# Patient Record
Sex: Female | Born: 1950
Health system: Southern US, Community
[De-identification: ages and names within clinical notes are randomized; demographics above are authoritative.]

## PROBLEM LIST (undated history)

## (undated) DIAGNOSIS — T4145XA Adverse effect of unspecified anesthetic, initial encounter: Secondary | ICD-10-CM

## (undated) DIAGNOSIS — H9191 Unspecified hearing loss, right ear: Secondary | ICD-10-CM

## (undated) DIAGNOSIS — I890 Lymphedema, not elsewhere classified: Secondary | ICD-10-CM

## (undated) DIAGNOSIS — I219 Acute myocardial infarction, unspecified: Secondary | ICD-10-CM

## (undated) DIAGNOSIS — T8859XA Other complications of anesthesia, initial encounter: Secondary | ICD-10-CM

## (undated) DIAGNOSIS — I10 Essential (primary) hypertension: Secondary | ICD-10-CM

## (undated) DIAGNOSIS — Z720 Tobacco use: Secondary | ICD-10-CM

## (undated) DIAGNOSIS — IMO0001 Reserved for inherently not codable concepts without codable children: Secondary | ICD-10-CM

## (undated) DIAGNOSIS — H8109 Meniere's disease, unspecified ear: Secondary | ICD-10-CM

## (undated) DIAGNOSIS — F419 Anxiety disorder, unspecified: Secondary | ICD-10-CM

## (undated) DIAGNOSIS — I480 Paroxysmal atrial fibrillation: Secondary | ICD-10-CM

## (undated) DIAGNOSIS — Z972 Presence of dental prosthetic device (complete) (partial): Secondary | ICD-10-CM

## (undated) DIAGNOSIS — J449 Chronic obstructive pulmonary disease, unspecified: Secondary | ICD-10-CM

## (undated) DIAGNOSIS — K08109 Complete loss of teeth, unspecified cause, unspecified class: Secondary | ICD-10-CM

## (undated) DIAGNOSIS — I639 Cerebral infarction, unspecified: Secondary | ICD-10-CM

## (undated) DIAGNOSIS — Z87442 Personal history of urinary calculi: Secondary | ICD-10-CM

## (undated) DIAGNOSIS — E785 Hyperlipidemia, unspecified: Secondary | ICD-10-CM

## (undated) DIAGNOSIS — I2510 Atherosclerotic heart disease of native coronary artery without angina pectoris: Secondary | ICD-10-CM

## (undated) DIAGNOSIS — I499 Cardiac arrhythmia, unspecified: Secondary | ICD-10-CM

## (undated) DIAGNOSIS — E669 Obesity, unspecified: Secondary | ICD-10-CM

## (undated) DIAGNOSIS — E1140 Type 2 diabetes mellitus with diabetic neuropathy, unspecified: Secondary | ICD-10-CM

## (undated) DIAGNOSIS — I6381 Other cerebral infarction due to occlusion or stenosis of small artery: Secondary | ICD-10-CM

## (undated) DIAGNOSIS — I6522 Occlusion and stenosis of left carotid artery: Secondary | ICD-10-CM

## (undated) DIAGNOSIS — H539 Unspecified visual disturbance: Secondary | ICD-10-CM

## (undated) DIAGNOSIS — R32 Unspecified urinary incontinence: Secondary | ICD-10-CM

## (undated) DIAGNOSIS — R7989 Other specified abnormal findings of blood chemistry: Secondary | ICD-10-CM

## (undated) DIAGNOSIS — M1990 Unspecified osteoarthritis, unspecified site: Secondary | ICD-10-CM

## (undated) DIAGNOSIS — C801 Malignant (primary) neoplasm, unspecified: Secondary | ICD-10-CM

## (undated) HISTORY — DX: Meniere's disease, unspecified ear: H81.09

## (undated) HISTORY — DX: Atherosclerotic heart disease of native coronary artery without angina pectoris: I25.10

## (undated) HISTORY — DX: Unspecified visual disturbance: H53.9

## (undated) HISTORY — DX: Hyperlipidemia, unspecified: E78.5

## (undated) HISTORY — PX: COLONOSCOPY W/ POLYPECTOMY: SHX1380

## (undated) HISTORY — DX: Lymphedema, not elsewhere classified: I89.0

## (undated) HISTORY — PX: CORONARY STENT PLACEMENT: SHX1402

## (undated) HISTORY — DX: Unspecified osteoarthritis, unspecified site: M19.90

## (undated) HISTORY — DX: Paroxysmal atrial fibrillation: I48.0

## (undated) HISTORY — DX: Essential (primary) hypertension: I10

## (undated) HISTORY — DX: Obesity, unspecified: E66.9

## (undated) HISTORY — PX: OTHER SURGICAL HISTORY: SHX169

## (undated) HISTORY — DX: Chronic obstructive pulmonary disease, unspecified: J44.9

## (undated) HISTORY — PX: BREAST BIOPSY: SHX20

---

## 1998-05-05 ENCOUNTER — Other Ambulatory Visit: Admission: RE | Admit: 1998-05-05 | Discharge: 1998-05-05 | Payer: Self-pay | Admitting: Family Medicine

## 1998-11-30 ENCOUNTER — Emergency Department (HOSPITAL_COMMUNITY): Admission: EM | Admit: 1998-11-30 | Discharge: 1998-11-30 | Payer: Self-pay | Admitting: Emergency Medicine

## 2000-11-25 ENCOUNTER — Encounter: Payer: Self-pay | Admitting: Family Medicine

## 2000-11-25 ENCOUNTER — Other Ambulatory Visit: Admission: RE | Admit: 2000-11-25 | Discharge: 2000-11-25 | Payer: Self-pay | Admitting: Family Medicine

## 2000-11-25 ENCOUNTER — Encounter: Admission: RE | Admit: 2000-11-25 | Discharge: 2000-11-25 | Payer: Self-pay | Admitting: Family Medicine

## 2000-11-25 ENCOUNTER — Encounter (INDEPENDENT_AMBULATORY_CARE_PROVIDER_SITE_OTHER): Payer: Self-pay | Admitting: *Deleted

## 2001-03-24 ENCOUNTER — Ambulatory Visit (HOSPITAL_COMMUNITY): Admission: RE | Admit: 2001-03-24 | Discharge: 2001-03-24 | Payer: Self-pay | Admitting: Gastroenterology

## 2001-03-24 ENCOUNTER — Encounter (INDEPENDENT_AMBULATORY_CARE_PROVIDER_SITE_OTHER): Payer: Self-pay | Admitting: Specialist

## 2002-01-20 ENCOUNTER — Emergency Department (HOSPITAL_COMMUNITY): Admission: EM | Admit: 2002-01-20 | Discharge: 2002-01-20 | Payer: Self-pay | Admitting: Emergency Medicine

## 2002-02-18 ENCOUNTER — Encounter: Payer: Self-pay | Admitting: Family Medicine

## 2002-02-18 ENCOUNTER — Encounter: Admission: RE | Admit: 2002-02-18 | Discharge: 2002-02-18 | Payer: Self-pay | Admitting: Family Medicine

## 2004-05-09 ENCOUNTER — Other Ambulatory Visit: Admission: RE | Admit: 2004-05-09 | Discharge: 2004-05-09 | Payer: Self-pay | Admitting: Family Medicine

## 2004-05-26 ENCOUNTER — Encounter: Admission: RE | Admit: 2004-05-26 | Discharge: 2004-05-26 | Payer: Self-pay | Admitting: Family Medicine

## 2004-06-23 ENCOUNTER — Encounter (INDEPENDENT_AMBULATORY_CARE_PROVIDER_SITE_OTHER): Payer: Self-pay | Admitting: Specialist

## 2004-06-23 ENCOUNTER — Ambulatory Visit (HOSPITAL_COMMUNITY): Admission: RE | Admit: 2004-06-23 | Discharge: 2004-06-23 | Payer: Self-pay | Admitting: Gastroenterology

## 2005-05-02 ENCOUNTER — Encounter: Admission: RE | Admit: 2005-05-02 | Discharge: 2005-05-02 | Payer: Self-pay | Admitting: Otolaryngology

## 2005-05-14 ENCOUNTER — Other Ambulatory Visit: Admission: RE | Admit: 2005-05-14 | Discharge: 2005-05-14 | Payer: Self-pay | Admitting: Family Medicine

## 2005-06-01 ENCOUNTER — Encounter: Admission: RE | Admit: 2005-06-01 | Discharge: 2005-06-01 | Payer: Self-pay | Admitting: Family Medicine

## 2005-06-07 ENCOUNTER — Encounter: Admission: RE | Admit: 2005-06-07 | Discharge: 2005-09-05 | Payer: Self-pay | Admitting: *Deleted

## 2006-06-04 ENCOUNTER — Encounter: Admission: RE | Admit: 2006-06-04 | Discharge: 2006-06-04 | Payer: Self-pay | Admitting: Family Medicine

## 2006-10-08 ENCOUNTER — Other Ambulatory Visit: Admission: RE | Admit: 2006-10-08 | Discharge: 2006-10-08 | Payer: Self-pay | Admitting: Family Medicine

## 2006-11-29 ENCOUNTER — Encounter: Admission: RE | Admit: 2006-11-29 | Discharge: 2006-11-29 | Payer: Self-pay | Admitting: Family Medicine

## 2006-12-02 ENCOUNTER — Encounter (HOSPITAL_BASED_OUTPATIENT_CLINIC_OR_DEPARTMENT_OTHER): Admission: RE | Admit: 2006-12-02 | Discharge: 2006-12-05 | Payer: Self-pay | Admitting: Surgery

## 2007-06-25 ENCOUNTER — Encounter: Admission: RE | Admit: 2007-06-25 | Discharge: 2007-06-25 | Payer: Self-pay | Admitting: Family Medicine

## 2007-10-13 ENCOUNTER — Other Ambulatory Visit: Admission: RE | Admit: 2007-10-13 | Discharge: 2007-10-13 | Payer: Self-pay | Admitting: Family Medicine

## 2008-04-09 ENCOUNTER — Encounter: Admission: RE | Admit: 2008-04-09 | Discharge: 2008-04-09 | Payer: Self-pay | Admitting: Family Medicine

## 2008-04-12 ENCOUNTER — Encounter: Admission: RE | Admit: 2008-04-12 | Discharge: 2008-04-12 | Payer: Self-pay | Admitting: Family Medicine

## 2008-04-14 ENCOUNTER — Encounter: Admission: RE | Admit: 2008-04-14 | Discharge: 2008-04-14 | Payer: Self-pay | Admitting: Family Medicine

## 2008-06-04 ENCOUNTER — Encounter (INDEPENDENT_AMBULATORY_CARE_PROVIDER_SITE_OTHER): Payer: Self-pay | Admitting: Internal Medicine

## 2008-06-04 ENCOUNTER — Ambulatory Visit (HOSPITAL_COMMUNITY): Admission: RE | Admit: 2008-06-04 | Discharge: 2008-06-04 | Payer: Self-pay | Admitting: Internal Medicine

## 2008-06-04 ENCOUNTER — Encounter (INDEPENDENT_AMBULATORY_CARE_PROVIDER_SITE_OTHER): Payer: Self-pay | Admitting: Interventional Radiology

## 2008-10-15 ENCOUNTER — Ambulatory Visit (HOSPITAL_COMMUNITY): Admission: RE | Admit: 2008-10-15 | Discharge: 2008-10-15 | Payer: Self-pay | Admitting: Internal Medicine

## 2009-03-19 ENCOUNTER — Inpatient Hospital Stay (HOSPITAL_COMMUNITY): Admission: EM | Admit: 2009-03-19 | Discharge: 2009-03-22 | Payer: Self-pay | Admitting: Emergency Medicine

## 2009-03-19 ENCOUNTER — Ambulatory Visit: Payer: Self-pay | Admitting: Internal Medicine

## 2009-03-21 ENCOUNTER — Encounter (INDEPENDENT_AMBULATORY_CARE_PROVIDER_SITE_OTHER): Payer: Self-pay | Admitting: Internal Medicine

## 2009-03-22 ENCOUNTER — Ambulatory Visit: Payer: Self-pay | Admitting: Vascular Surgery

## 2009-04-13 ENCOUNTER — Ambulatory Visit: Payer: Self-pay | Admitting: Internal Medicine

## 2009-04-13 DIAGNOSIS — I480 Paroxysmal atrial fibrillation: Secondary | ICD-10-CM

## 2009-04-13 DIAGNOSIS — F17200 Nicotine dependence, unspecified, uncomplicated: Secondary | ICD-10-CM

## 2009-04-13 DIAGNOSIS — I4891 Unspecified atrial fibrillation: Secondary | ICD-10-CM | POA: Insufficient documentation

## 2009-04-13 DIAGNOSIS — E663 Overweight: Secondary | ICD-10-CM

## 2009-04-13 DIAGNOSIS — R0789 Other chest pain: Secondary | ICD-10-CM | POA: Insufficient documentation

## 2009-04-14 ENCOUNTER — Encounter: Admission: RE | Admit: 2009-04-14 | Discharge: 2009-04-14 | Payer: Self-pay | Admitting: Family Medicine

## 2009-04-19 ENCOUNTER — Telehealth (INDEPENDENT_AMBULATORY_CARE_PROVIDER_SITE_OTHER): Payer: Self-pay | Admitting: *Deleted

## 2009-05-06 ENCOUNTER — Ambulatory Visit: Payer: Self-pay | Admitting: Internal Medicine

## 2009-05-06 ENCOUNTER — Ambulatory Visit: Payer: Self-pay

## 2009-05-06 DIAGNOSIS — I10 Essential (primary) hypertension: Secondary | ICD-10-CM

## 2009-05-09 LAB — CONVERTED CEMR LAB
Basophils Relative: 0.9 % (ref 0.0–3.0)
Calcium: 9.2 mg/dL (ref 8.4–10.5)
Creatinine, Ser: 0.8 mg/dL (ref 0.4–1.2)
HCT: 44.2 % (ref 36.0–46.0)
Lymphs Abs: 2 10*3/uL (ref 0.7–4.0)
MCHC: 33.4 g/dL (ref 30.0–36.0)
MCV: 93.2 fL (ref 78.0–100.0)
Monocytes Relative: 7.4 % (ref 3.0–12.0)
Neutrophils Relative %: 60.3 % (ref 43.0–77.0)
RBC: 4.74 M/uL (ref 3.87–5.11)
Sodium: 140 meq/L (ref 135–145)
WBC: 7.1 10*3/uL (ref 4.5–10.5)

## 2009-06-13 ENCOUNTER — Telehealth: Payer: Self-pay | Admitting: Internal Medicine

## 2009-07-21 ENCOUNTER — Encounter (INDEPENDENT_AMBULATORY_CARE_PROVIDER_SITE_OTHER): Payer: Self-pay | Admitting: *Deleted

## 2009-08-29 ENCOUNTER — Ambulatory Visit: Payer: Self-pay | Admitting: Internal Medicine

## 2009-11-14 ENCOUNTER — Encounter: Payer: Self-pay | Admitting: Internal Medicine

## 2009-12-01 ENCOUNTER — Encounter: Payer: Self-pay | Admitting: Internal Medicine

## 2009-12-01 ENCOUNTER — Other Ambulatory Visit: Admission: RE | Admit: 2009-12-01 | Discharge: 2009-12-01 | Payer: Self-pay | Admitting: Family Medicine

## 2009-12-05 ENCOUNTER — Ambulatory Visit: Payer: Self-pay | Admitting: Internal Medicine

## 2010-01-24 ENCOUNTER — Encounter: Admission: RE | Admit: 2010-01-24 | Discharge: 2010-01-24 | Payer: Self-pay | Admitting: Internal Medicine

## 2010-03-12 ENCOUNTER — Encounter: Payer: Self-pay | Admitting: Family Medicine

## 2010-03-21 NOTE — Progress Notes (Signed)
Summary: Cancelling Myoview  Phone Note Outgoing Call   Call placed by: Milana Na, EMT-P,  April 19, 2009 4:47 PM Summary of Call: Reviewed information on Myoview Information Sheet (see scanned document for further details).  Spoke with patient. She states that she needs to cancel and will call back to reschedule. She is going to be out of town during the time of the test. smw

## 2010-03-21 NOTE — Assessment & Plan Note (Signed)
Summary: eph   Visit Type:  Initial Consult Primary Provider:  Catha Gosselin, MD  CC:  Swelling in both legs/ dizziness.[name]  History of Present Illness: The patient presents today for electrophysiology followup after her recent hospitalization for afib.  She reports multiple complaints today.  She reports shortness of breath at rest, not worsening with activity.  She reports occasional "palpitations and heart racing".  She also reports sharp lacenating chest pain at rest lasting several seconds.  In addition, she reports occasional nause and "hand tingling".  She is tearful and notes significant emotional stress.  She also reports significant anxiety recently and feels that her symptoms may be related to anxiety.  She denies symptoms of orthopnea, PND, lower extremity edema, dizziness, presyncope, syncope, or neurologic sequela. The patient is tolerating medications without difficulties and is otherwise without complaint today.   Current Medications (verified): 1)  Aspirin 81 Mg Tbec (Aspirin) .... Take One Tablet By Mouth Daily 2)  Clonidine Hcl 0.1 Mg Tabs (Clonidine Hcl) .... Take One Tablet By Mouth Twice A Day 3)  Crestor 20 Mg Tabs (Rosuvastatin Calcium) .... Take One Tablet By Mouth Daily. 4)  Pradaxa 150 Mg Caps (Dabigatran Etexilate Mesylate) .... Take 1 Tablet By Mouth Two Times A Day 5)  Multaq 400 Mg Tabs (Dronedarone Hcl) .... Take 1 Tablet By Mouth Two Times A Day 6)  Metformin Hcl 500 Mg Tabs (Metformin Hcl) .... Take One Tablet By Mouth Twice Daily. 7)  Valium 2 Mg Tabs (Diazepam) .... As Needed 8)  Spiriva Handihaler 18 Mcg Caps (Tiotropium Bromide Monohydrate) .... Once Daily 9)  Motrin Ib 200 Mg Tabs (Ibuprofen) .... As Needed  Allergies (verified): 1)  ! Sulfa  Past History:  Past Medical History:  1. Diabetes.   2. Hypertension.   3. Ongoing tobacco use.   4. COPD.   5. Obesity.   6. Chronic lymphedema.   7. Osteoarthritis.   8. Meniere disease.   9. Atrial  fibrillation  10.Status post nuclear stress testing in 2010 for which, she reports it was normal.   11.CVD  Family History:  The patient's mother died of bladder cancer at age 66.   Her father died of an MI at age 16 and brother died of leukemia at age  49.   Social History: The patient lives in [place] with her spouse.  She   has a 45-pack-year history of tobacco, which is ongoing.  She denies  alcohol or drug use.  Review of Systems       All systems are reviewed and negative except as listed in the HPI.   Vital Signs:  Patient profile:   60 year old female Height:      66 inches Weight:      212 pounds BMI:     34.34 Pulse rate:   50 / minute BP sitting:   142 / 86  (left arm)  Vitals Entered By: Laurance Flatten CMA (April 13, 2009 12:32 PM)  Physical Exam  General:  tearful and anxious Head:  normocephalic and atraumatic Eyes:  PERRLA/EOM intact; conjunctiva and lids normal. Mouth:  Teeth, gums and palate normal. Oral mucosa normal. Neck:  Neck supple, no JVD. No masses, thyromegaly or abnormal cervical nodes. Lungs:  Clear bilaterally to auscultation and percussion. Heart:  brady regular rhythm, no m/r/g Abdomen:  Bowel sounds positive; abdomen soft and non-tender without masses, organomegaly, or hernias noted. No hepatosplenomegaly. Msk:  Back normal, normal gait. Muscle strength and tone normal. Pulses:  pulses normal in all 4 extremities Extremities:  No clubbing or cyanosis.  1+ edema Neurologic:  Alert and oriented x 3.  CNII-XII intact, strength/sensation are intact Skin:  Intact without lesions or rashes. Cervical Nodes:  no significant adenopathy Psych:  Normal affect.   EKG  Procedure date:  04/13/2009  Findings:      sinus bradycardia 50 bpm, QTc 417, nonspecific ST/T changes  Impression & Recommendations:  Problem # 1:  ATRIAL FIBRILLATION (ICD-427.31) The patient was recently hospitalized with afib in the setting of a URI.  She has  palpitations which may be further afib.  We will place a 21 day event monitor to evaluation for arrhythmias and heart rate control. Continue multaq and pradaxa.  Consider CBC and CMET upon return with recent addition of pradaxa.  Problem # 2:  CHEST PAIN, ATYPICAL (ICD-786.59) The patient has atypical chest pain and SOB of unclear etiology.  We will place 21 day monitor to evaluate for arrhythmias as the cause. We will also obtain a lexiscan myoview to evaluate for ischemia.   Pt advised to go to the ER in the interim for worsening CP or SOB.  Her updated medication list for this problem includes:    Aspirin 81 Mg Tbec (Aspirin) .[name]... Take one tablet by mouth daily  Problem # 3:  TOBACCO ABUSE (ICD-305.1) cessation advised  Problem # 4:  OVERWEIGHT/OBESITY (ICD-278.02) lifestyle modificaiton advised  Other Orders: EKG w/ Interpretation (93000) Event (Event) Nuclear Stress Test (Nuc Stress Test)  Patient Instructions: 1)  Your physician has requested that you have an exercise stress myoview.  For further information please visit [email or web address]  Please follow instruction sheet, as given. 2)  Your physician has recommended that you wear an event monitor.  Event monitors are medical devices that record the heart's electrical activity. Doctors most often use these monitors to diagnose arrhythmias. Arrhythmias are problems with the speed or rhythm of the heartbeat. The monitor is a small, portable device. You can wear one while you do your normal daily activities. This is usually used to diagnose what is causing palpitations/syncope (passing out). 3)  Your physician discussed the hazards of tobacco use.  Tobacco use cessation is recommended and techniques and options to help you quit were discussed. 4)  Your physician recommends that you schedule a follow-up appointment in: 4 WEEKS

## 2010-03-21 NOTE — Assessment & Plan Note (Signed)
Summary: 1 month   Visit Type:  Follow-up Primary Provider:  Catha Gosselin, MD   History of Present Illness: The patient presents today for electrophysiology followup.   She continues to have frequent palpitations and episodic "heart racing", predominantly at night.  She did not have her stress test or event monitor placed as ordered.  She states that her insurance would not pay for her monitor. She reports shortness of breath at rest, not worsening with activity.  She reports occasional "palpitations and heart racing".  She has had no further chest pain.  She continues to smoke.  She denies symptoms of orthopnea, PND, lower extremity edema, dizziness, presyncope, syncope, or neurologic sequela. The patient is tolerating medications without difficulties and is otherwise without complaint today.   Current Medications (verified): 1)  Aspirin 81 Mg Tbec (Aspirin) .... Take One Tablet By Mouth Daily 2)  Clonidine Hcl 0.1 Mg Tabs (Clonidine Hcl) .... Take One Tablet By Mouth Twice A Day/ Pt Is Out. 3)  Crestor 20 Mg Tabs (Rosuvastatin Calcium) .... Take One Tablet By Mouth Daily. 4)  Pradaxa 150 Mg Caps (Dabigatran Etexilate Mesylate) .... Take 1 Tablet By Mouth Two Times A Day 5)  Multaq 400 Mg Tabs (Dronedarone Hcl) .... Take 1 Tablet By Mouth Two Times A Day 6)  Metformin Hcl 500 Mg Tabs (Metformin Hcl) .... Take One Tablet By Mouth Twice Daily. 7)  Valium 2 Mg Tabs (Diazepam) .... As Needed 8)  Spiriva Handihaler 18 Mcg Caps (Tiotropium Bromide Monohydrate) .... As Needed 9)  Motrin Ib 200 Mg Tabs (Ibuprofen) .... As Needed  Allergies (verified): 1)  ! Sulfa  Past History:  Past Medical History: Reviewed history from 04/13/2009 and no changes required.  1. Diabetes.   2. Hypertension.   3. Ongoing tobacco use.   4. COPD.   5. Obesity.   6. Chronic lymphedema.   7. Osteoarthritis.   8. Meniere disease.   9. Atrial fibrillation  10.Status post nuclear stress testing in 2010 for  which, she reports it was normal.   11.CVD  Social History: Reviewed history from 04/13/2009 and no changes required. The patient lives in [place] with her spouse.  She   has a 45-pack-year history of tobacco, which is ongoing.  She denies  alcohol or drug use.  Review of Systems       All systems are reviewed and negative except as listed in the HPI.   Vital Signs:  Patient profile:   60 year old female Height:      66 inches Weight:      210 pounds BMI:     34.02 Pulse rate:   55 / minute BP sitting:   150 / 90  (left arm)  Vitals Entered By: Laurance Flatten CMA (May 06, 2009 9:33 AM)  Physical Exam  General:  Well developed, well nourished, in no acute distress. Head:  normocephalic and atraumatic Eyes:  PERRLA/EOM intact; conjunctiva and lids normal. Mouth:  Teeth, gums and palate normal. Oral mucosa normal. Neck:  Neck supple, no JVD. No masses, thyromegaly or abnormal cervical nodes. Lungs:  Clear bilaterally to auscultation and percussion. Heart:  brady regular rhythm, no m/r/g Abdomen:  Bowel sounds positive; abdomen soft and non-tender without masses, organomegaly, or hernias noted. No hepatosplenomegaly. Msk:  Back normal, normal gait. Muscle strength and tone normal. Pulses:  pulses normal in all 4 extremities Extremities:  No clubbing or cyanosis.  1+ edema Neurologic:  Alert and oriented x 3.  CNII-XII intact,  strength/sensation are intact Skin:  Intact without lesions or rashes. Cervical Nodes:  no significant adenopathy Psych:  Normal affect.   EKG  Procedure date:  05/06/2009  Findings:      sinus rhythm 54 bpm, short PR with possible pre-excitation, QT 520 msec  Impression & Recommendations:  Problem # 1:  ATRIAL FIBRILLATION (ICD-427.31)  The patient was recently hospitalized with afib in the setting of a URI.  She has palpitations which may be further afib, though she is in sinus today.  Ideally a heart monitor would be beneficial.  We will  attempt to have a lifewatch placed if insurance will allow Continue multaq and pradaxa.  CBC and CMET upon return with recent addition of pradaxa. I have reviewed her EKG which reveals a short PR today.  We will consider EP study pending results of her event monitor.  Orders: EKG w/ Interpretation (93000) Holter Monitor (Holter Monitor)  Problem # 2:  CHEST PAIN, ATYPICAL (ICD-786.59) Pt declines stress testing at this time. Her chest pain has resolved If symptoms return, she will need further risk stratification.  Problem # 3:  TOBACCO ABUSE (ICD-305.1) cessation advised  Problem # 4:  ESSENTIAL HYPERTENSION, BENIGN (ICD-401.1) will add HCTZ 25mg  daily today  Other Orders: TLB-BMP (Basic Metabolic Panel-BMET) (80048-METABOL) TLB-CBC Platelet - w/Differential (85025-CBCD)  Patient Instructions: 1)  Your physician recommends that you schedule a follow-up appointment in: 6 weeks with Dr Johney Frame 2)  Your physician recommends that you return for lab work today 3)  Stop Smoking 4)  Your physician has recommended you make the following change in your medication: stop Clonidine and start HCTZ 25mg  daily 5)  Your physician has recommended that you wear an event monitor.  Event monitors are medical devices that record the heart's electrical activity. Doctors most often use these monitors to diagnose arrhythmias. Arrhythmias are problems with the speed or rhythm of the heartbeat. The monitor is a small, portable device. You can wear one while you do your normal daily activities. This is usually used to diagnose what is causing palpitations/syncope (passing out). Prescriptions: HYDROCHLOROTHIAZIDE 25 MG TABS (HYDROCHLOROTHIAZIDE) one by mouth daily  #30 x 11   Entered by:   Dennis Bast, RN, BSN   Authorized by:   Hillis Range, MD   Signed by:   Dennis Bast, RN, BSN on 05/06/2009   Method used:   Electronically to        [facility] [address] [phone]* (retail)       [address]       [place], [place]  [address]       Ph: [phone] or [phone]       Fax: [phone]   RxID:   [id number]

## 2010-03-21 NOTE — Assessment & Plan Note (Signed)
Summary: rov   Visit Type:  Follow-up Primary Provider:  Catha Gosselin, MD   History of Present Illness: The patient presents today for routine electrophysiology followup. She reports doing very well since last being seen in our  clinic.  She is unaware of any further episodes of afib.  The patient denies symptoms of palpitations, chest pain, orthopnea, PND, lower extremity edema, dizziness, presyncope, syncope, or neurologic sequela.  She reports occasional chest discomfort associated with spicy meals.  She continues to smoke and has stable SOB.  The patient is tolerating medications without difficulties and is otherwise without complaint today.   Current Medications (verified): 1)  Aspirin 81 Mg Tbec (Aspirin) .... Take One Tablet By Mouth Daily 2)  Crestor 20 Mg Tabs (Rosuvastatin Calcium) .... Take One Tablet By Mouth Daily. 3)  Multaq 400 Mg Tabs (Dronedarone Hcl) .... Take 1 Tablet By Mouth Two Times A Day 4)  Amaryl 4 Mg Tabs (Glimepiride) .... Once Daily 5)  Valium 2 Mg Tabs (Diazepam) .... As Needed 6)  Spiriva Handihaler 18 Mcg Caps (Tiotropium Bromide Monohydrate) .... As Needed 7)  Motrin Ib 200 Mg Tabs (Ibuprofen) .... As Needed 8)  Hydrochlorothiazide 25 Mg Tabs (Hydrochlorothiazide) .... One By Mouth Daily 9)  Metformin Hcl 500 Mg Tabs (Metformin Hcl) .... Once Daily  Allergies: 1)  ! Sulfa  Past History:  Past Medical History: Reviewed history from 04/13/2009 and no changes required.  1. Diabetes.   2. Hypertension.   3. Ongoing tobacco use.   4. COPD.   5. Obesity.   6. Chronic lymphedema.   7. Osteoarthritis.   8. Meniere disease.   9. Atrial fibrillation  10.Status post nuclear stress testing in 2010 for which, she reports it was normal.   11.CVD  Social History: Reviewed history from 04/13/2009 and no changes required. The patient lives in [place] with her spouse.  She   has a 45-pack-year history of tobacco, which is ongoing.  She denies  alcohol or  drug use.  Vital Signs:  Patient profile:   60 year old female Height:      66 inches Weight:      210 pounds BMI:     34.02 Pulse rate:   57 / minute BP sitting:   130 / 70  (left arm)  Vitals Entered By: Laurance Flatten CMA (August 29, 2009 12:00 PM)  Physical Exam  General:  Well developed, well nourished, in no acute distress. Head:  normocephalic and atraumatic Eyes:  PERRLA/EOM intact; conjunctiva and lids normal. Mouth:  Teeth, gums and palate normal. Oral mucosa normal. Neck:  Neck supple, no JVD. No masses, thyromegaly or abnormal cervical nodes. Lungs:  Clear bilaterally to auscultation and percussion. Heart:   regular rhythm, no m/r/g Abdomen:  Bowel sounds positive; abdomen soft and non-tender without masses, organomegaly, or hernias noted. No hepatosplenomegaly. Msk:  Back normal, normal gait. Muscle strength and tone normal. Pulses:  pulses normal in all 4 extremities Extremities:  No clubbing or cyanosis.  1+ edema in R leg Neurologic:  Alert and oriented x 3.  CNII-XII intact, strength/sensation are intact Skin:  Intact without lesions or rashes. Cervical Nodes:  no significant adenopathy Psych:  Normal affect.   EKG  Procedure date:  08/29/2009  Findings:      sinus bradycardia 57 bpm, PR 126, nonspecific ST/T changes  Event Monitor  Procedure date:  06/13/2009  Findings:      sinis rhythm,  no SVT or afib,  occasional PACs and  PVCs  Impression & Recommendations:  Problem # 1:  ATRIAL FIBRILLATION (ICD-427.31) maintaining sinus rhythm with multaq she stopped pradaxa due to costs.  I have made it clear that given her CHADS2 score of 2, she should either be on coumadin or pradaxa.  She says that she will restart pradaxa at this time. She should stop asa once on pradaxa  Problem # 2:  ESSENTIAL HYPERTENSION, BENIGN (ICD-401.1) stable no changes  Problem # 3:  TOBACCO ABUSE (ICD-305.1) cessation advised she is reluctant to quit at this time  Patient  Instructions: 1)  Your physician recommends that you schedule a follow-up appointment in: 6 months with Dr Johney Frame 2)  Your physician has recommended you make the following change in your medication: resstart Pradaxa 150mg  two times a day Prescriptions: PRADAXA 150 MG CAPS (DABIGATRAN ETEXILATE MESYLATE) one by mouth two times a day  #60 x 6   Entered by:   Dennis Bast, RN, BSN   Authorized by:   Hillis Range, MD   Signed by:   Dennis Bast, RN, BSN on 08/29/2009   Method used:   [facility]  [address] [phone][facility] (retail)       [address]       [place], [place]  [address]       Ph: [phone] or [phone]       Fax: [phone]   RxID:   [id number]

## 2010-03-21 NOTE — Miscellaneous (Signed)
Summary: Multaq Review  Clinical Lists Changes   EP Quality review of patient taking Multaq.  I have reviewed clinical chart which reveals that Melanie Lucas is doing well on Multaq and receiving clinical benefit.  Her EF is preserved and she has no symptoms of CHF. She is maintaining sinus rhythm with multaq.   No changes are therefore advised.    She will need lfts obtained.

## 2010-03-21 NOTE — Progress Notes (Signed)
Summary: calling back  Phone Note Call from Patient Call back at Home Phone [phone]   Caller: Patient Reason for Call: Talk to Nurse Summary of Call: calling back Initial call taken by: Lorne Skeens,  June 13, 2009 12:38 PM  Follow-up for Phone Call        gave her the results of monitor Dennis Bast, RN, BSN  June 13, 2009 1:19 PM

## 2010-03-21 NOTE — Letter (Signed)
Summary: Appointment - Missed  [facility], Main Office  [address]   [place], [place] [address]   Phone: [phone]  Fax: [phone]     July 21, 2009 MRN: [id number]   [facility] [address] [place], [place]  [address]   Dear Ms. Hard,  Our records indicate you missed your appointment on 07/20/09 with Dr Johney Frame. It is very important that we reach you to reschedule this appointment. We look forward to participating in your health care needs. Please contact us at the number listed above at your earliest convenience to reschedule this appointment.     Sincerely,   Ruel Favors Scheduling Team

## 2010-05-07 LAB — URINE CULTURE
Colony Count: NO GROWTH
Culture: NO GROWTH

## 2010-05-07 LAB — TSH: TSH: 1.537 u[IU]/mL (ref 0.350–4.500)

## 2010-05-07 LAB — CBC
Hemoglobin: 14.9 g/dL (ref 12.0–15.0)
MCHC: 34 g/dL (ref 30.0–36.0)
Platelets: 220 10*3/uL (ref 150–400)
Platelets: 227 10*3/uL (ref 150–400)
RDW: 12.7 % (ref 11.5–15.5)
RDW: 12.9 % (ref 11.5–15.5)
WBC: 9 10*3/uL (ref 4.0–10.5)
WBC: 9.1 10*3/uL (ref 4.0–10.5)

## 2010-05-07 LAB — CULTURE, BLOOD (ROUTINE X 2)
Culture: NO GROWTH
Culture: NO GROWTH

## 2010-05-07 LAB — GLUCOSE, CAPILLARY
Glucose-Capillary: 214 mg/dL — ABNORMAL HIGH (ref 70–99)
Glucose-Capillary: 217 mg/dL — ABNORMAL HIGH (ref 70–99)
Glucose-Capillary: 227 mg/dL — ABNORMAL HIGH (ref 70–99)
Glucose-Capillary: 252 mg/dL — ABNORMAL HIGH (ref 70–99)
Glucose-Capillary: 260 mg/dL — ABNORMAL HIGH (ref 70–99)
Glucose-Capillary: 269 mg/dL — ABNORMAL HIGH (ref 70–99)

## 2010-05-07 LAB — DIFFERENTIAL
Basophils Absolute: 0.1 10*3/uL (ref 0.0–0.1)
Lymphs Abs: 2.7 10*3/uL (ref 0.7–4.0)

## 2010-05-07 LAB — COMPREHENSIVE METABOLIC PANEL
ALT: 16 U/L (ref 0–35)
AST: 15 U/L (ref 0–37)
AST: 16 U/L (ref 0–37)
Alkaline Phosphatase: 108 U/L (ref 39–117)
BUN: 9 mg/dL (ref 6–23)
CO2: 27 mEq/L (ref 19–32)
Calcium: 8.5 mg/dL (ref 8.4–10.5)
Chloride: 102 mEq/L (ref 96–112)
Chloride: 104 mEq/L (ref 96–112)
GFR calc Af Amer: >60 mL/min (ref >60)
GFR calc non Af Amer: >60 mL/min (ref >60)
Glucose, Bld: 223 mg/dL — ABNORMAL HIGH (ref 70–99)
Potassium: 3.5 mEq/L (ref 3.5–5.1)
Potassium: 4 mEq/L (ref 3.5–5.1)
Sodium: 137 mEq/L (ref 135–145)
Sodium: 138 mEq/L (ref 135–145)

## 2010-05-07 LAB — PROTIME-INR
INR: 0.94 (ref 0.00–1.49)
INR: 0.98 (ref 0.00–1.49)
INR: 0.99 (ref 0.00–1.49)
Prothrombin Time: 12.5 seconds (ref 11.6–15.2)

## 2010-05-07 LAB — LEGIONELLA ANTIGEN, URINE: Legionella Antigen, Urine: NEGATIVE

## 2010-05-07 LAB — RAPID URINE DRUG SCREEN, HOSP PERFORMED
Amphetamines: NOT DETECTED
Barbiturates: NOT DETECTED
Benzodiazepines: NOT DETECTED
Cocaine: NOT DETECTED
Opiates: NOT DETECTED

## 2010-05-07 LAB — BRAIN NATRIURETIC PEPTIDE: Pro B Natriuretic peptide (BNP): 234 pg/mL — ABNORMAL HIGH (ref 0.0–100.0)

## 2010-05-07 LAB — D-DIMER, QUANTITATIVE: D-Dimer, Quant: <0.22 ug/mL-FEU (ref 0.00–0.48)

## 2010-05-07 LAB — HEPARIN LEVEL (UNFRACTIONATED): Heparin Unfractionated: 0.42 IU/mL (ref 0.30–0.70)

## 2010-05-07 LAB — URINALYSIS, ROUTINE W REFLEX MICROSCOPIC
Bilirubin Urine: NEGATIVE
Hgb urine dipstick: NEGATIVE
Protein, ur: NEGATIVE mg/dL
Urobilinogen, UA: 0.2 mg/dL (ref 0.0–1.0)

## 2010-05-07 LAB — CARDIAC PANEL(CRET KIN+CKTOT+MB+TROPI)
Relative Index: INVALID (ref 0.0–2.5)
Total CK: 35 U/L (ref 7–177)
Total CK: 39 U/L (ref 7–177)
Troponin I: 0.08 ng/mL — ABNORMAL HIGH (ref 0.00–0.06)

## 2010-05-07 LAB — POCT CARDIAC MARKERS
Myoglobin, poc: 56.1 ng/mL (ref 12–200)
Myoglobin, poc: 56.3 ng/mL (ref 12–200)
Troponin i, poc: <0.05 ng/mL (ref 0.00–0.09)

## 2010-05-10 LAB — LIPID PANEL
HDL: 43 mg/dL (ref >39)
LDL Cholesterol: 107 mg/dL — ABNORMAL HIGH (ref 0–99)
Triglycerides: 130 mg/dL (ref <150)

## 2010-05-10 LAB — CBC
Hemoglobin: 13.3 g/dL (ref 12.0–15.0)
MCHC: 34.6 g/dL (ref 30.0–36.0)
RBC: 4.15 MIL/uL (ref 3.87–5.11)
WBC: 7.5 10*3/uL (ref 4.0–10.5)

## 2010-05-10 LAB — PROTIME-INR
INR: 1.3 (ref 0.00–1.49)
Prothrombin Time: 16.1 seconds — ABNORMAL HIGH (ref 11.6–15.2)

## 2010-05-10 LAB — GLUCOSE, CAPILLARY: Glucose-Capillary: 158 mg/dL — ABNORMAL HIGH (ref 70–99)

## 2010-05-31 LAB — CBC
HCT: 42.6 % (ref 36.0–46.0)
Hemoglobin: 15 g/dL (ref 12.0–15.0)
RBC: 4.68 MIL/uL (ref 3.87–5.11)
WBC: 6.4 10*3/uL (ref 4.0–10.5)

## 2010-05-31 LAB — GLUCOSE, CAPILLARY
Glucose-Capillary: 157 mg/dL — ABNORMAL HIGH (ref 70–99)
Glucose-Capillary: 252 mg/dL — ABNORMAL HIGH (ref 70–99)

## 2010-05-31 LAB — PROTIME-INR: INR: 1 (ref 0.00–1.49)

## 2010-07-04 NOTE — Letter (Signed)
May 06, 2009     RE:  Melanie Lucas, Melanie Lucas  MRN:  [id number]  /  DOB:  1950-09-18   To Whom it May Concern:   I am writing a letter of medical necessity for a 21 day [employer] event  monitor for [name].  Melanie Lucas is a very pleasant 60 year old female  with recently diagnosed atrial fibrillation during a hospitalization for  rapid heart racing.  She was initiated on medical therapy for atrial  fibrillation.  She subsequently continues to have frequent episodes of  heart racing as well as palpitations predominantly at night.  She has  occasional intermittent episodes of shortness of breath as well.  Her  EKG has documented a short PR interval and is suggestive of possible pre-  excitation.  I think to further medically manage her palpitations it  would be of utmost importance to document their etiology.  I therefore  feel strongly that she should have an event monitor placed for  evaluation of her palpitations and also to evaluate episodic atrial  fibrillation to make sure that her heart rates are well-controlled.   Your assistance in this manner is greatly appreciated.    Sincerely,      Hillis Range, MD  Electronically Signed    JA/MedQ  DD: 05/06/2009  DT: 05/06/2009  Job #: [id number]   CC:    Dennis Bast

## 2010-07-04 NOTE — Consult Note (Signed)
NAMEMARYCARMEN, Melanie Lucas                  ACCOUNT NO.:  [id number]   MEDICAL RECORD NO.:  [id number]          PATIENT TYPE:  REC   LOCATION:  FOOT                         FACILITY:  [facility]   PHYSICIAN:  Theresia Majors. Tanda Rockers, M.D.DATE OF BIRTH:  August 01, 1950   DATE OF CONSULTATION:  12/03/2006  DATE OF DISCHARGE:                                 CONSULTATION   REFERRING PHYSICIAN:  Dr. Illene Bolus.   Melanie Lucas is a 60 year old female referred by Dr. Illene Bolus for  evaluation of lymphedema.   IMPRESSION:  Chronic lymphedema.   RECOMMENDATION:  We have recommended that the patient be referred to the  lymphedema clinic for consideration of the integration of bilateral  segmental compression leg pumps for control of her lymphedema.   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  She has not complained of ulcerations.  There has been no pain.  She does complain of heaviness in the legs and  some mild dull aching.  There has been no fever and no trauma.   PAST MEDICAL HISTORY:   ALLERGIES:  Sulfa.   PREVIOUS SURGERY:  Has included a benign breast biopsy on 2 occasions.   HER CURRENT MEDICATION LIST INCLUDES:  1. Terbinafine 250 mg daily.  2. Simvastatin 40 mg h.s.  3. Lisinopril 20 mg q.a.m.  4. Cefazolin 500 mg t.i.d.  5. Valium p.r.n.   FAMILY HISTORY:  Positive for hypertension, stroke and diabetes.   SOCIALLY:  She is retired.  She resides in [facility] [place].  She has 2  adult children.   REVIEW OF SYSTEMS:  The patient is a 1-pack per day smoker for 43 years.  She does have a nagging cough.  Her last chest x-ray was within the  year.  She denies hemoptysis or significant dyspnea on exertion.  She  denies transient visual loss, speech impediment other than the stigmata  of TIAs.  She  specifically denies chest pain or tachycardias.  There are  no GI or GU complaints.  She does admit to some transient anxiety.  The  remainder of the review of systems is negative.   PHYSICAL EXAM:  She is an alert, pleasant female in no acute distress.  Blood pressure is 143/72, respirations 18, pulse rate 62, temperature is  98.3.  HEENT:  Exam is clear.  NECK:  Supple.  Trachea is midline.  Thyroid is nonpalpable.  LUNGS:  Clear.  HEART:  Sounds are normal.  ABDOMEN:  Soft.  EXTREMITY EXAM:  Remarkable for bilateral lymphedema.  There is a well-  formed eschar on a miniscule area on the lateral lower extremity.  There  is no hyperpigmentation.  The pedal pulses are 3+ bilaterally.  There is  engorgement of all 5 toes on each foot, but there are no verrucae  formations, the pedal surface of the foot is smooth without extreme  callus.  NEUROLOGICALLY:  The patient's sensation is preserved.   DISCUSSION:  Melanie Lucas has a long history of lymphedema which has been  reasonably controlled with the use of periodic external compression  hose.  She has not suffered significant complications.  At this point  both of the legs are symmetrical.  There is no concurrent venous  disease.  We are recommending that she be seen locally by the lymphedema  clinic to integrate the possibility of sequential pumping into her  regimen of control of edema.  There is no evidence of arterial  insufficiency as her ABIs are 1 bilaterally.   We have cautioned her with regard to the overzealousness compression  therapy in its ability to move the nidus of lymphedema from the legs to  the trunk.  These issues are of concern to her and I have encouraged her  to discuss those with the lymphedema nurse.  We have given the patient  opportunity to ask questions.  She seems to understand the clinical  impression and recommendations and indicates that she will be compliant.      Harold A. Tanda Rockers, M.D.   Electronically Signed     HAN/MEDQ  D:  12/03/2006  T:  12/04/2006  Job:  [id number]   cc:   Caryn Bee L. Little, M.D.  Sherlynn Stalls, M.D.

## 2010-07-07 NOTE — Op Note (Signed)
Melanie Lucas, Melanie Lucas                  ACCOUNT NO.:  [id number]   MEDICAL RECORD NO.:  [id number]          PATIENT TYPE:  AMB   LOCATION:  ENDO                         FACILITY:  [facility]   PHYSICIAN:  James L. Malon Kindle., M.D.DATE OF BIRTH:  08-Jun-1950   DATE OF PROCEDURE:  06/23/2004  DATE OF DISCHARGE:                                 OPERATIVE REPORT   PROCEDURE:  Colonoscopy and polypectomy.   INDICATIONS FOR PROCEDURE:  Previous history of colon polyps.  This is done  a followup.   DESCRIPTION OF PROCEDURE:  The procedure was explained to the patient and  consent obtained.   With the patient in the left lateral decubitus position, the Olympus  pediatric scope was inserted.  The prep was excellent.  Using abdominal  pressure and position changes, we were able to advance to the cecum.  The  cecum was seen well.  The terminal ileum was entered for a short distance  approximately 4 cm and was normal.  The scope was withdrawn.  There were  polyps found in the ascending colon.  Initially, I removed three polyps in  the most distal part of the ascending colon, each about approximately 0.5 cm  and sucked through the scope.  We then removed a 0.75-cm polyp.  It was too  large to go through the scope.  It may have even been 1 cm.  We pulled it  out against the scope.  The scope was reinserted and advanced back to that  site.  On withdrawal, no polyps were seen in the transverse.  A 0.5-cm was  seen in the descending colon and was removed with the snare and placed in  jar #2.  In jar #3, a 0.75-cm was removed from the sigmoid colon  approximately at 30 cm.  The scope was withdrawn.   The patient tolerated the procedure well and was maintained on low-flow  oxygen and pulse oximeter throughout the procedure.   ASSESSMENT:  Multiple polyps removed throughout the colon, 211.3.   PLAN:  Routine post-polypectomy instructions.  Will recommend repeating the  procedure in three years.      JLE/MEDQ  D:  06/23/2004  T:  06/23/2004  Job:  [id number]   cc:   Caryn Bee L. Little, M.D.  [address]  [place]  [place] [address]  Fax: [phone]

## 2010-07-07 NOTE — Procedures (Signed)
[facility]  Patient:    Melanie Lucas, Melanie Lucas Visit Number: [id number] MRN: [id number]          Service Type: Attending:  Fayrene Fearing L. Randa Evens, M.D. Dictated by:   Llana Aliment. Randa Evens, M.D. Proc. Date: 03/24/01   CC:         Caryn Bee L. Little, M.D.   Procedure Report  PROCEDURE:  Colonoscopy and polypectomy.  DATE OF BIRTH:  1950-11-13  MEDICATIONS:  Fentanyl 75 mcg, Versed 8.5 mg IV.  SCOPE:  Olympus pediatric video colonoscope.  INDICATIONS FOR PROCEDURE:  Colon cancer screening.  DESCRIPTION OF PROCEDURE:  The procedure had been explained to the patient and consent obtained. With the patient in the left lateral decubitus position, the pediatric Olympus video colonoscope was inserted, advanced under direct visualization. The prep was quite good. We were able to reach over to the cecum, the ileocecal valve was seen. The scope was withdrawn. In the cecum right within approximately 3-4 cm from the appendiceal orifice was a sessile polyp approximately 3/4 cm, this was removed with a mini snare and sucked up against the scope. We could see actually fairly well and no other gross polyps or lesions were seen. We withdrew this back up to the transverse colon at which point it was irrigated off and to recover the tripod retriever, the scope was withdrawn. The transverse colon, descending and sigmoid colon were seen well with no further polyps. The rectum was also seen well and was free of polyps. The scope was withdrawn, the polyp recovered, the scope reinserted and the colon decompressed. The patient tolerated the procedure well and was maintained on low flow oxygen and pulse oximeter throughout the procedure.  ASSESSMENT:  Cecal polyp removed.  PLAN:  Will check path. Routine post polypectomy instructions. Dictated by:   Llana Aliment. Randa Evens, M.D. Attending:  Llana Aliment. Randa Evens, M.D. DD:  03/24/01 TD:  03/24/01 Job: [id number]

## 2010-08-31 ENCOUNTER — Other Ambulatory Visit: Payer: Self-pay | Admitting: Family Medicine

## 2010-08-31 DIAGNOSIS — M545 Low back pain: Secondary | ICD-10-CM

## 2010-08-31 DIAGNOSIS — M1990 Unspecified osteoarthritis, unspecified site: Secondary | ICD-10-CM

## 2010-08-31 DIAGNOSIS — R200 Anesthesia of skin: Secondary | ICD-10-CM

## 2010-09-04 ENCOUNTER — Ambulatory Visit
Admission: RE | Admit: 2010-09-04 | Discharge: 2010-09-04 | Disposition: A | Discharge status: Home / Self Care | Payer: Medicare Other | Source: Ambulatory Visit | Attending: Family Medicine | Admitting: Family Medicine

## 2010-09-04 DIAGNOSIS — M545 Low back pain, unspecified: Secondary | ICD-10-CM

## 2010-09-04 DIAGNOSIS — R200 Anesthesia of skin: Secondary | ICD-10-CM

## 2010-09-04 DIAGNOSIS — M1990 Unspecified osteoarthritis, unspecified site: Secondary | ICD-10-CM

## 2010-09-19 ENCOUNTER — Emergency Department (HOSPITAL_COMMUNITY)
Admission: EM | Admit: 2010-09-19 | Discharge: 2010-09-20 | Disposition: A | Discharge status: Home / Self Care | Payer: Medicare Other | Attending: Emergency Medicine | Admitting: Emergency Medicine

## 2010-09-19 DIAGNOSIS — Z7901 Long term (current) use of anticoagulants: Secondary | ICD-10-CM | POA: Insufficient documentation

## 2010-09-19 DIAGNOSIS — E119 Type 2 diabetes mellitus without complications: Secondary | ICD-10-CM | POA: Insufficient documentation

## 2010-09-19 DIAGNOSIS — R319 Hematuria, unspecified: Secondary | ICD-10-CM | POA: Insufficient documentation

## 2010-09-19 DIAGNOSIS — I4891 Unspecified atrial fibrillation: Secondary | ICD-10-CM | POA: Insufficient documentation

## 2010-09-20 LAB — URINALYSIS, ROUTINE W REFLEX MICROSCOPIC
Nitrite: POSITIVE — AB
Specific Gravity, Urine: 1.036 — ABNORMAL HIGH (ref 1.005–1.030)
Urobilinogen, UA: 1 mg/dL (ref 0.0–1.0)
pH: 5.5 (ref 5.0–8.0)

## 2010-09-20 LAB — CBC
Hemoglobin: 14.5 g/dL (ref 12.0–15.0)
MCH: 31.5 pg (ref 26.0–34.0)
MCHC: 35.4 g/dL (ref 30.0–36.0)
MCV: 89.1 fL (ref 78.0–100.0)
Platelets: 186 10*3/uL (ref 150–400)
RBC: 4.6 MIL/uL (ref 3.87–5.11)

## 2010-09-20 LAB — BASIC METABOLIC PANEL
GFR calc Af Amer: >60 mL/min (ref >60)
GFR calc non Af Amer: >60 mL/min (ref >60)
Glucose, Bld: 219 mg/dL — ABNORMAL HIGH (ref 70–99)
Potassium: 3.1 mEq/L — ABNORMAL LOW (ref 3.5–5.1)
Sodium: 136 mEq/L (ref 135–145)

## 2010-09-20 LAB — DIFFERENTIAL
Basophils Relative: 0 % (ref 0–1)
Eosinophils Absolute: 0.1 10*3/uL (ref 0.0–0.7)
Lymphs Abs: 2.7 10*3/uL (ref 0.7–4.0)
Monocytes Absolute: 0.7 10*3/uL (ref 0.1–1.0)
Monocytes Relative: 7 % (ref 3–12)
Neutrophils Relative %: 65 % (ref 43–77)

## 2010-09-20 LAB — URINE MICROSCOPIC-ADD ON

## 2010-11-08 ENCOUNTER — Telehealth: Payer: Self-pay | Admitting: Internal Medicine

## 2010-11-08 NOTE — Telephone Encounter (Signed)
Patient calling on new drug for her heart - pradaxa 150 mg. Pt stop taken med's for about 3 weeks.  Went to er x 2 for bleeding in bladder. C/O hurting in chest pt thinks it gas.  Is there something else patient can take

## 2010-11-13 NOTE — Telephone Encounter (Signed)
She has stopped all of her medications  The only thing that she is now taking is the Metformin She started this back due to a BS of 432  I will schedule her to see Dr Johney Frame at his next avaliable

## 2010-11-13 NOTE — Telephone Encounter (Signed)
Called and spoke with patient.  Saw the urologist yesterday  Stopped the Pradaxa 4 weeks ago teh bleeding stopped, then restarted the medication and the bleeding started back again after the first day.  She stopped again

## 2010-12-05 ENCOUNTER — Encounter: Payer: Self-pay | Admitting: *Deleted

## 2010-12-06 ENCOUNTER — Ambulatory Visit (INDEPENDENT_AMBULATORY_CARE_PROVIDER_SITE_OTHER): Payer: Medicare Other | Admitting: Internal Medicine

## 2010-12-06 ENCOUNTER — Encounter: Payer: Self-pay | Admitting: Internal Medicine

## 2010-12-06 DIAGNOSIS — I4891 Unspecified atrial fibrillation: Secondary | ICD-10-CM

## 2010-12-06 DIAGNOSIS — R0789 Other chest pain: Secondary | ICD-10-CM

## 2010-12-06 DIAGNOSIS — F17200 Nicotine dependence, unspecified, uncomplicated: Secondary | ICD-10-CM

## 2010-12-06 DIAGNOSIS — I10 Essential (primary) hypertension: Secondary | ICD-10-CM

## 2010-12-06 LAB — HEPATIC FUNCTION PANEL
Alkaline Phosphatase: 100 U/L (ref 39–117)
Bilirubin, Direct: 0.1 mg/dL (ref 0.0–0.3)
Total Bilirubin: 0.7 mg/dL (ref 0.3–1.2)

## 2010-12-06 NOTE — Assessment & Plan Note (Signed)
Not ready to quit at this time 5 minutes spent counseling about this.   

## 2010-12-06 NOTE — Assessment & Plan Note (Signed)
Above goal I have recommended that she avoid salt and nsaids No changes today

## 2010-12-06 NOTE — Patient Instructions (Signed)
Your physician recommends that you schedule a follow-up appointment in: 3 months with Dr Allred  Your physician recommends that you return for lab work today    

## 2010-12-06 NOTE — Assessment & Plan Note (Signed)
afib is well controlled with multaq We will check LFTs on multaq today. Her CHADS2 score is 2 (HTN and DM).  She should therefore be on coumadin, pradaxa, or xarelto for stroke prevention. I will forward my note to Dr Norlene Duel office to see if the patient would be a candidate for coumadin or xarelto.  Given prior hematuria, I would be reluctant to use pradaxa. No changes today

## 2010-12-06 NOTE — Progress Notes (Signed)
The patient presents today for routine electrophysiology followup.  She has not been compliant with follow-ups recently.  Since last being seen in our clinic, the patient reports doing reasonably well. Her afib is stable.  She reports resolution of her chest pain.  She reports having hematuria 7/12 on pradaxa for which she was evaluated by Dr Elba Barman.  She was found to have a low risk cystoscopy.  She denies SOB, dizziness, presyncope, syncope, or neurologic sequela.  Her edema is stable.  The patient feels that she is tolerating medications without difficulties and is otherwise without complaint today.   Past Medical History  Diagnosis Date  . Diabetes mellitus   . Hypertension   . Atrial fibrillation   . COPD (chronic obstructive pulmonary disease)   . Obesity   . Chronic acquired lymphedema   . Osteoarthritis   . Meniere disease   . CVD (cardiovascular disease)   . Hematuria     on pradaxa,  she reports that she has seen Dr Elba Barman and had low risk cystoscopy   No past surgical history on file.  Current Outpatient Prescriptions  Medication Sig Dispense Refill  . diazepam (VALIUM) 2 MG tablet Take 2 mg by mouth as needed.        . dronedarone (MULTAQ) 400 MG tablet Take 400 mg by mouth daily.       [name] glimepiride (AMARYL) 4 MG tablet Take 4 mg by mouth daily before breakfast.        . hydrochlorothiazide (HYDRODIURIL) 25 MG tablet Take 25 mg by mouth daily.        [name] ibuprofen (ADVIL,MOTRIN) 200 MG tablet Take 200 mg by mouth every 6 (six) hours as needed.        . metFORMIN (GLUCOPHAGE XR) 500 MG 24 hr tablet Take 1,000 mg by mouth daily with breakfast.       . rosuvastatin (CRESTOR) 20 MG tablet Take 20 mg by mouth daily.        [name] tiotropium (SPIRIVA) 18 MCG inhalation capsule Place 18 mcg into inhaler and inhale daily.         Allergies  Allergen Reactions  . Sulfonamide Derivatives     History   Social History  . Marital Status: Married    Spouse Name: N/A    Number of Children:  N/A  . Years of Education: N/A   Occupational History  . Not on file.   Social History Main Topics  . Smoking status: Current Everyday Smoker -- 1.0 packs/day for 46 years    Types: Cigarettes  . Smokeless tobacco: Never Used   Comment: not ready to quit  . Alcohol Use: No  . Drug Use: No  . Sexually Active: Not on file   Other Topics Concern  . Not on file   Social History Narrative  . No narrative on file    Family History  Problem Relation Age of Onset  . Heart attack Father   . Coronary artery disease Father     strong family hx  . Leukemia Brother   . Cancer Mother     bladder    Physical Exam: Filed Vitals:   12/06/10 1236  BP: 149/82  Pulse: 61  Height: 5\' 7"  (1.702 m)  Weight: 210 lb (95.255 kg)    GEN- The patient is well appearing, alert and oriented x 3 today.   Head- normocephalic, atraumatic Eyes-  Sclera clear, conjunctiva pink Ears- hearing intact Oropharynx- clear Neck- supple, no JVP Lymph- no  cervical lymphadenopathy Lungs- Clear to ausculation bilaterally, normal work of breathing Heart- Regular rate and rhythm, no murmurs, rubs or gallops, PMI not laterally displaced GI- soft, NT, ND, + BS Extremities- no clubbing, cyanosis, trace edema MS- no significant deformity or atrophy Skin- no rash or lesion Psych- euthymic mood, full affect Neuro- strength and sensation are intact  ekg today reveals sinus rhythm 56 bpm, nonspecific ST/T changes  Assessment and Plan:

## 2010-12-06 NOTE — Assessment & Plan Note (Signed)
Resolved Low risk myoview previously No changes today

## 2010-12-11 ENCOUNTER — Other Ambulatory Visit: Payer: Self-pay | Admitting: Dermatology

## 2011-06-08 ENCOUNTER — Telehealth: Payer: Self-pay | Admitting: Internal Medicine

## 2011-06-08 NOTE — Telephone Encounter (Signed)
Patient states was seen by her PCP because she has been having chest pain for a few days, no other symptoms. PCP recommended for pt to call Dr. Johney Frame for an appointment. Patient is aware that no appointments are available with Dr. Johney Frame. An appointment was made with Melanie Newcomer PA on the day Dr. Johney Frame is in the office 06/13/11 at 2:00 PM patient aware , she is to call the office back if needed.

## 2011-06-08 NOTE — Telephone Encounter (Signed)
Spoke to pt who states she is having chest pain and has had pain for last week--advised pt to go to nearest ED--pt wants to come to office to see dr allred --advised dr allred not in office today--go to ED--pt then asked where dr allred was today and i told her dr allred was working at [employer] today--she was hesitant, but think she may go--nt

## 2011-06-08 NOTE — Telephone Encounter (Signed)
New Problem:     Patient called in wanting to schedule an appointment to seen Dr. Johney Frame.  Patient was complaining of chest pain, which was only chest soreness and no other reported symptoms. Please call back.

## 2011-06-13 ENCOUNTER — Ambulatory Visit: Payer: Medicare Other | Admitting: Physician Assistant

## 2012-01-15 NOTE — Telephone Encounter (Signed)
Called pt made NP appt for 1/27.  Consult requested by Dr Dessie ComaBurghard for fibromyalgia.

## 2012-02-20 HISTORY — PX: CARDIAC CATHETERIZATION: SHX172

## 2012-02-25 ENCOUNTER — Ambulatory Visit (INDEPENDENT_AMBULATORY_CARE_PROVIDER_SITE_OTHER): Payer: Medicare Other | Admitting: Internal Medicine

## 2012-02-25 ENCOUNTER — Encounter: Payer: Self-pay | Admitting: Internal Medicine

## 2012-02-25 VITALS — BP 148/82 | HR 64 | Ht 67.0 in | Wt 210.0 lb

## 2012-02-25 DIAGNOSIS — E785 Hyperlipidemia, unspecified: Secondary | ICD-10-CM

## 2012-02-25 DIAGNOSIS — F17200 Nicotine dependence, unspecified, uncomplicated: Secondary | ICD-10-CM

## 2012-02-25 DIAGNOSIS — I4891 Unspecified atrial fibrillation: Secondary | ICD-10-CM

## 2012-02-25 DIAGNOSIS — E663 Overweight: Secondary | ICD-10-CM

## 2012-02-25 DIAGNOSIS — R609 Edema, unspecified: Secondary | ICD-10-CM | POA: Insufficient documentation

## 2012-02-25 DIAGNOSIS — R600 Localized edema: Secondary | ICD-10-CM | POA: Insufficient documentation

## 2012-02-25 DIAGNOSIS — I10 Essential (primary) hypertension: Secondary | ICD-10-CM

## 2012-02-25 NOTE — Patient Instructions (Signed)
Your physician wants you to follow-up in: 9 months with Dr Johney Frame. You will receive a reminder letter in the mail two months in advance. If you don't receive a letter, please call our office to schedule the follow-up appointment.  Your physician has requested that you have an echocardiogram. Echocardiography is a painless test that uses sound waves to create images of your heart. It provides your doctor with information about the size and shape of your heart and how well your heart's chambers and valves are working. This procedure takes approximately one hour. There are no restrictions for this procedure.  Your physician recommends that you return for lab work drawn with your echocardiogram (lipid, liver, BMP)   Smoking Cessation Quitting smoking is important to your health and has many advantages. However, it is not always easy to quit since nicotine is a very addictive drug. Often times, people try 3 times or more before being able to quit. This document explains the best ways for you to prepare to quit smoking. Quitting takes hard work and a lot of effort, but you can do it. ADVANTAGES OF QUITTING SMOKING  You will live longer, feel better, and live better.  Your body will feel the impact of quitting smoking almost immediately.  Within 20 minutes, blood pressure decreases. Your pulse returns to its normal level.  After 8 hours, carbon monoxide levels in the blood return to normal. Your oxygen level increases.  After 24 hours, the chance of having a heart attack starts to decrease. Your breath, hair, and body stop smelling like smoke.  After 48 hours, damaged nerve endings begin to recover. Your sense of taste and smell improve.  After 72 hours, the body is virtually free of nicotine. Your bronchial tubes relax and breathing becomes easier.  After 2 to 12 weeks, lungs can hold more air. Exercise becomes easier and circulation improves.  The risk of having a heart attack, stroke, cancer,  or lung disease is greatly reduced.  After 1 year, the risk of coronary heart disease is cut in half.  After 5 years, the risk of stroke falls to the same as a nonsmoker.  After 10 years, the risk of lung cancer is cut in half and the risk of other cancers decreases significantly.  After 15 years, the risk of coronary heart disease drops, usually to the level of a nonsmoker.  If you are pregnant, quitting smoking will improve your chances of having a healthy baby.  The people you live with, especially any children, will be healthier.  You will have extra money to spend on things other than cigarettes. QUESTIONS TO THINK ABOUT BEFORE ATTEMPTING TO QUIT You may want to talk about your answers with your caregiver.  Why do you want to quit?  If you tried to quit in the past, what helped and what did not?  What will be the most difficult situations for you after you quit? How will you plan to handle them?  Who can help you through the tough times? Your family? Friends? A caregiver?  What pleasures do you get from smoking? What ways can you still get pleasure if you quit? Here are some questions to ask your caregiver:  How can you help me to be successful at quitting?  What medicine do you think would be best for me and how should I take it?  What should I do if I need more help?  What is smoking withdrawal like? How can I get information on withdrawal?  GET READY  Set a quit date.  Change your environment by getting rid of all cigarettes, ashtrays, matches, and lighters in your home, car, or work. Do not let people smoke in your home.  Review your past attempts to quit. Think about what worked and what did not. GET SUPPORT AND ENCOURAGEMENT You have a better chance of being successful if you have help. You can get support in many ways.  Tell your family, friends, and co-workers that you are going to quit and need their support. Ask them not to smoke around you.  Get  individual, group, or telephone counseling and support. Programs are available at [employer] and health centers. Call your local health department for information about programs in your area.  Spiritual beliefs and practices may help some smokers quit.  Download a "quit meter" on your computer to keep track of quit statistics, such as how long you have gone without smoking, cigarettes not smoked, and money saved.  Get a self-help book about quitting smoking and staying off of tobacco. LEARN NEW SKILLS AND BEHAVIORS  Distract yourself from urges to smoke. Talk to someone, go for a walk, or occupy your time with a task.  Change your normal routine. Take a different route to work. Drink tea instead of coffee. Eat breakfast in a different place.  Reduce your stress. Take a hot bath, exercise, or read a book.  Plan something enjoyable to do every day. Reward yourself for not smoking.  Explore interactive web-based programs that specialize in helping you quit. GET MEDICINE AND USE IT CORRECTLY Medicines can help you stop smoking and decrease the urge to smoke. Combining medicine with the above behavioral methods and support can greatly increase your chances of successfully quitting smoking.  Nicotine replacement therapy helps deliver nicotine to your body without the negative effects and risks of smoking. Nicotine replacement therapy includes nicotine gum, lozenges, inhalers, nasal sprays, and skin patches. Some may be available over-the-counter and others require a prescription.  Antidepressant medicine helps people abstain from smoking, but how this works is unknown. This medicine is available by prescription.  Nicotinic receptor partial agonist medicine simulates the effect of nicotine in your brain. This medicine is available by prescription. Ask your caregiver for advice about which medicines to use and how to use them based on your health history. Your caregiver will tell you what side  effects to look out for if you choose to be on a medicine or therapy. Carefully read the information on the package. Do not use any other product containing nicotine while using a nicotine replacement product.  RELAPSE OR DIFFICULT SITUATIONS Most relapses occur within the first 3 months after quitting. Do not be discouraged if you start smoking again. Remember, most people try several times before finally quitting. You may have symptoms of withdrawal because your body is used to nicotine. You may crave cigarettes, be irritable, feel very hungry, cough often, get headaches, or have difficulty concentrating. The withdrawal symptoms are only temporary. They are strongest when you first quit, but they will go away within 10 14 days. To reduce the chances of relapse, try to:  Avoid drinking alcohol. Drinking lowers your chances of successfully quitting.  Reduce the amount of caffeine you consume. Once you quit smoking, the amount of caffeine in your body increases and can give you symptoms, such as a rapid heartbeat, sweating, and anxiety.  Avoid smokers because they can make you want to smoke.  Do not let weight gain  distract you. Many smokers will gain weight when they quit, usually less than 10 pounds. Eat a healthy diet and stay active. You can always lose the weight gained after you quit.  Find ways to improve your mood other than smoking. FOR MORE INFORMATION  www.smokefree.gov  Document Released: 01/30/2001 Document Revised: 08/07/2011 Document Reviewed: 05/17/2011 [facility] Patient Information 2013 [place], [place].    2 Gram Low Sodium Diet A 2 gram sodium diet restricts the amount of sodium in the diet to no more than 2 g or 2000 mg daily. Limiting the amount of sodium is often used to help lower blood pressure. It is important if you have heart, liver, or kidney problems. Many foods contain sodium for flavor and sometimes as a preservative. When the amount of sodium in a diet needs to be  low, it is important to know what to look for when choosing foods and drinks. The following includes some information and guidelines to help make it easier for you to adapt to a low sodium diet. QUICK TIPS  Do not add salt to food.  Avoid convenience items and fast food.  Choose unsalted snack foods.  Buy lower sodium products, often labeled as "lower sodium" or "no salt added."  Check food labels to learn how much sodium is in 1 serving.  When eating at a restaurant, ask that your food be prepared with less salt or none, if possible. READING FOOD LABELS FOR SODIUM INFORMATION The nutrition facts label is a good place to find how much sodium is in foods. Look for products with no more than 500 to 600 mg of sodium per meal and no more than 150 mg per serving. Remember that 2 g = 2000 mg. The food label may also list foods as:  Sodium-free: Less than 5 mg in a serving.  Very low sodium: 35 mg or less in a serving.  Low-sodium: 140 mg or less in a serving.  Light in sodium: 50% less sodium in a serving. For example, if a food that usually has 300 mg of sodium is changed to become light in sodium, it will have 150 mg of sodium.  Reduced sodium: 25% less sodium in a serving. For example, if a food that usually has 400 mg of sodium is changed to reduced sodium, it will have 300 mg of sodium. CHOOSING FOODS Grains  Avoid: Salted crackers and snack items. Some cereals, including instant hot cereals. Bread stuffing and biscuit mixes. Seasoned rice or pasta mixes.  Choose: Unsalted snack items. Low-sodium cereals, oats, puffed wheat and rice, shredded wheat. English muffins and bread. Pasta. Meats  Avoid: Salted, canned, smoked, spiced, pickled meats, including fish and poultry. Bacon, ham, sausage, cold cuts, hot dogs, anchovies.  Choose: Low-sodium canned tuna and salmon. Fresh or frozen meat, poultry, and fish. Dairy  Avoid: Processed cheese and spreads. Cottage cheese. Buttermilk  and condensed milk. Regular cheese.  Choose: Milk. Low-sodium cottage cheese. Yogurt. Sour cream. Low-sodium cheese. Fruits and Vegetables  Avoid: Regular canned vegetables. Regular canned tomato sauce and paste. Frozen vegetables in sauces. Olives. Rosita Fire. Relishes. Sauerkraut.  Choose: Low-sodium canned vegetables. Low-sodium tomato sauce and paste. Frozen or fresh vegetables. Fresh and frozen fruit. Condiments  Avoid: Canned and packaged gravies. Worcestershire sauce. Tartar sauce. Barbecue sauce. Soy sauce. Steak sauce. Ketchup. Onion, garlic, and table salt. Meat flavorings and tenderizers.  Choose: Fresh and dried herbs and spices. Low-sodium varieties of mustard and ketchup. Lemon juice. Tabasco sauce. Horseradish. SAMPLE 2 GRAM SODIUM MEAL  PLAN Breakfast / Sodium (mg)  1 cup low-fat milk / 143 mg  2 slices whole-wheat toast / 270 mg  1 tbs heart-healthy margarine / 153 mg  1 hard-boiled egg / 139 mg  1 small orange / 0 mg Lunch / Sodium (mg)  1 cup raw carrots / 76 mg   cup hummus / 298 mg  1 cup low-fat milk / 143 mg   cup red grapes / 2 mg  1 whole-wheat pita bread / 356 mg Dinner / Sodium (mg)  1 cup whole-wheat pasta / 2 mg  1 cup low-sodium tomato sauce / 73 mg  3 oz lean ground beef / 57 mg  1 small side salad (1 cup raw spinach leaves,  cup cucumber,  cup yellow bell pepper) with 1 tsp olive oil and 1 tsp red wine vinegar / 25 mg Snack / Sodium (mg)  1 container low-fat vanilla yogurt / 107 mg  3 graham cracker squares / 127 mg Nutrient Analysis  Calories: 2033  Protein: 77 g  Carbohydrate: 282 g  Fat: 72 g  Sodium: 1971 mg Document Released: 02/05/2005 Document Revised: 04/30/2011 Document Reviewed: 05/09/2009 [facility] Patient Information 2013 [place], [place].

## 2012-02-25 NOTE — Assessment & Plan Note (Signed)
Not ready to quit at this time 5 minutes spent counseling about this.

## 2012-02-25 NOTE — Assessment & Plan Note (Signed)
Weight loss advised  Fasting lipids and LFTs ordered today and importance of compliance with this was discussed

## 2012-02-25 NOTE — Progress Notes (Signed)
PCP: Sigmund Hazel, MD  The patient presents today for routine electrophysiology followup.  She has not been compliant with follow-ups recently and has not been seen by me in over a year.  Since last being seen in our clinic, the patient reports doing reasonably well. Her afib is well controlled with only once daily multaq.  She has stable but chronic BLE edema.  She continues to smoke and is not ready to quit.  She denies exertional CP, SOB, dizziness, presyncope, syncope, or neurologic sequela.   The patient feels that she is tolerating medications without difficulties and is otherwise without complaint today.   Past Medical History  Diagnosis Date  . Diabetes mellitus   . Hypertension   . Paroxysmal atrial fibrillation   . COPD (chronic obstructive pulmonary disease)   . Obesity   . Chronic acquired lymphedema   . Osteoarthritis   . Meniere disease   . CVD (cardiovascular disease)   . Hematuria     on pradaxa,  she reports that she has seen Dr Elba Barman and had low risk cystoscopy   No past surgical history on file.  Current Outpatient Prescriptions  Medication Sig Dispense Refill  . diazepam (VALIUM) 2 MG tablet Take 2 mg by mouth as needed.        . dronedarone (MULTAQ) 400 MG tablet Take 400 mg by mouth daily.       [name] glimepiride (AMARYL) 4 MG tablet Take 4 mg by mouth daily before breakfast.        . hydrochlorothiazide (HYDRODIURIL) 25 MG tablet Take 25 mg by mouth daily.        [name] ibuprofen (ADVIL,MOTRIN) 200 MG tablet Take 200 mg by mouth every 6 (six) hours as needed.        . metFORMIN (GLUCOPHAGE XR) 500 MG 24 hr tablet Take 1,000 mg by mouth daily with breakfast.       . Multiple Vitamin (MULTIVITAMIN) tablet Take 1 tablet by mouth daily.      . rosuvastatin (CRESTOR) 20 MG tablet Take 20 mg by mouth daily.        [name] tiotropium (SPIRIVA) 18 MCG inhalation capsule Place 18 mcg into inhaler and inhale daily as needed.         Allergies  Allergen Reactions  . Sulfonamide  Derivatives     History   Social History  . Marital Status: Married    Spouse Name: N/A    Number of Children: N/A  . Years of Education: N/A   Occupational History  . Not on file.   Social History Main Topics  . Smoking status: Current Every Day Smoker -- 1.0 packs/day for 46 years    Types: Cigarettes  . Smokeless tobacco: Never Used     Comment: not ready to quit  . Alcohol Use: No  . Drug Use: No  . Sexually Active: Not on file   Other Topics Concern  . Not on file   Social History Narrative   She operates an entertainment business    Family History  Problem Relation Age of Onset  . Heart attack Father   . Coronary artery disease Father     strong family hx  . Leukemia Brother   . Cancer Mother     bladder    Physical Exam: Filed Vitals:   02/25/12 1640  BP: 148/82  Pulse: 64  Height: 5\' 7"  (1.702 m)  Weight: 210 lb (95.255 kg)    GEN- The patient is well appearing,  alert and oriented x 3 today.   Head- normocephalic, atraumatic Eyes-  Sclera clear, conjunctiva pink Ears- hearing intact Oropharynx- clear Neck- supple, no JVP Lymph- no cervical lymphadenopathy Lungs- Clear to ausculation bilaterally, normal work of breathing Heart- Regular rate and rhythm, no murmurs, rubs or gallops, PMI not laterally displaced GI- soft, NT, ND, + BS Extremities- no clubbing, cyanosis, trace edema MS- no significant deformity or atrophy Skin- no rash or lesion Psych- euthymic mood, full affect Neuro- strength and sensation are intact  ekg today reveals sinus rhythm 64 bpm, nonspecific ST/T changes  Assessment and Plan:

## 2012-02-25 NOTE — Assessment & Plan Note (Signed)
Primarily due to venous insufficiency Weight loss advised 2 gram sodium diet She has support stockings but does not want to wear them Echo to evaluate for structural heart changes, though this is unlikely the cause for her symptoms as these have been chronically an issue for her

## 2012-02-25 NOTE — Assessment & Plan Note (Signed)
Mildly elevated Check BMET and echo 2 gram sodium restriction

## 2012-02-25 NOTE — Assessment & Plan Note (Signed)
Continue current medicine regimen She declines anticoagulation due to prior hematuria.  Given CHADS2 score of 2, she really should be anticoagulated but is not ready to do so.  Compliance issues may make anticoagulation difficult also.

## 2012-03-03 ENCOUNTER — Other Ambulatory Visit (HOSPITAL_COMMUNITY): Payer: Self-pay | Admitting: Internal Medicine

## 2012-03-03 ENCOUNTER — Other Ambulatory Visit (INDEPENDENT_AMBULATORY_CARE_PROVIDER_SITE_OTHER): Payer: Medicare Other

## 2012-03-03 ENCOUNTER — Ambulatory Visit (HOSPITAL_COMMUNITY): Payer: Medicare Other | Attending: Cardiovascular Disease | Admitting: Radiology

## 2012-03-03 DIAGNOSIS — F17200 Nicotine dependence, unspecified, uncomplicated: Secondary | ICD-10-CM | POA: Insufficient documentation

## 2012-03-03 DIAGNOSIS — I379 Nonrheumatic pulmonary valve disorder, unspecified: Secondary | ICD-10-CM | POA: Insufficient documentation

## 2012-03-03 DIAGNOSIS — I4891 Unspecified atrial fibrillation: Secondary | ICD-10-CM | POA: Insufficient documentation

## 2012-03-03 DIAGNOSIS — E785 Hyperlipidemia, unspecified: Secondary | ICD-10-CM

## 2012-03-03 DIAGNOSIS — I059 Rheumatic mitral valve disease, unspecified: Secondary | ICD-10-CM | POA: Insufficient documentation

## 2012-03-03 DIAGNOSIS — I872 Venous insufficiency (chronic) (peripheral): Secondary | ICD-10-CM

## 2012-03-03 DIAGNOSIS — I10 Essential (primary) hypertension: Secondary | ICD-10-CM | POA: Insufficient documentation

## 2012-03-03 DIAGNOSIS — I369 Nonrheumatic tricuspid valve disorder, unspecified: Secondary | ICD-10-CM | POA: Insufficient documentation

## 2012-03-03 DIAGNOSIS — R609 Edema, unspecified: Secondary | ICD-10-CM | POA: Insufficient documentation

## 2012-03-03 LAB — LIPID PANEL
HDL: 39.4 mg/dL (ref >39.00)
Total CHOL/HDL Ratio: 3
VLDL: 33 mg/dL (ref 0.0–40.0)

## 2012-03-03 LAB — BASIC METABOLIC PANEL
CO2: 28 mEq/L (ref 19–32)
GFR: 95.96 mL/min (ref >60.00)
Glucose, Bld: 206 mg/dL — ABNORMAL HIGH (ref 70–99)
Potassium: 3.7 mEq/L (ref 3.5–5.1)
Sodium: 139 mEq/L (ref 135–145)

## 2012-03-03 LAB — HEPATIC FUNCTION PANEL: Total Bilirubin: 0.7 mg/dL (ref 0.3–1.2)

## 2012-03-03 NOTE — Progress Notes (Signed)
Echocardiogram performed.  

## 2012-03-17 MED ORDER — GABAPENTIN 300 MG PO CAPS
300 MG | ORAL_CAPSULE | ORAL | Status: DC
Start: 2012-03-17 — End: 2012-04-08

## 2012-03-17 MED ORDER — METRONIDAZOLE 0.75 % EX GEL
0.75 % | CUTANEOUS | Status: AC
Start: 2012-03-17 — End: ?

## 2012-03-17 NOTE — Progress Notes (Signed)
[facility]  Rheumatology  Gwenith Daily. Cherita Hebel MD Consult Note      Reason for Consult: Chronic widespread pain, fatigue, shortness of breath  Requesting Physician:  Dr. Shelbie Hutching    CHIEF COMPLAINT:   History Obtained From:  patient, spouse, Outside medical records and arthritis questionnaire    HISTORY OF PRESENT ILLNESS:                The patient is a 62 y.o. female  whom I am asked to see in consult with Several year history of chronic widespread pain.  This is associated with difficulty initiating sleep, fatigue, constipation predominant irritable bowel, muscle pain and joint pain.  She recently has developed shortness of breath and a sense of feeling "closed".  She states that she has been evaluated by neurology and pulmonary and been told that she is normal.  She is using an inhaler.  She just started duloxetine 30 mg daily one week ago.  There is no rash, alopecia, Raynaud's, pleurisy, pericarditis or inflammatory bowel disease.  She does note a sense of mental fogginess. She was hospitalized August 23-25 2013 with a negative workup.  She was diagnosed at that time fibromyalgia    Past Medical History:        Diagnosis Date   ??? Asthma    ??? Headache(784.0)    ??? Cataract    ??? Anemia      Past Surgical History:        Procedure Laterality Date   ??? Tubal ligation     ??? Breast lumpectomy Left 1984     Current Medications:    No current facility-administered medications for this visit.  Allergies:  Advair diskus    Social History:    TOBACCO:   reports that she has quit smoking. She has never used smokeless tobacco.  ETOH:   reports that she does not drink alcohol.  MARITAL STATUS:  married  OCCUPATION:  Unemployed    Family History:       Problem Relation Age of Onset   ??? Heart Disease Mother    ??? Diabetes Mother    ??? Cancer Father    ??? Cancer Maternal Aunt    ??? Diabetes Maternal Aunt    ??? Heart Disease Maternal Grandmother    ??? Arthritis Maternal Cousin      REVIEW OF SYSTEMS:    CONSTITUTIONAL:  Positive for  fatigue and weakness.  EYES:  negative for  dry eyes, visual disturbance and redness  HEENT:  negative for recurrent infection, sicca symptoms, and oral ulcers.  History of hoarseness  RESPIRATORY: See present illness.  CARDIOVASCULAR:  negative for  chest pain, dyspnea, pleurisy.  GASTROINTESTINAL:  No history of nausea, vomiting, diarrhea or bleeding.  History of constipation.  GENITOURINARY:  negative for frequency, dysuria and hematuria, pyuria  INTEGUMENT/BREAST:  negative for rash, alopecia and Raynaud's symptoms.  HEMATOLOGIC/LYMPHATIC:  negative for easy bruising, lymphadenopathy and cytopenias.  ALLERGIC/IMMUNOLOGIC:  negative  ENDOCRINE:  Negative for DM, Thyroid, parathyroid disease and osteoporosis  MUSCULOSKELETAL: See history of present illness  NEUROLOGICAL:  negative for seizures, memory problems, visual disturbance, dysphagia and weakness  BEHAVIOR/PSYCH:  History of depression  PHYSICAL EXAM:      Vitals:    BP 124/82   Pulse 68   Ht 5' 4.25" (1.632 m)   Wt 204 lb 6.4 oz (92.715 kg)   BMI 34.81 kg/m2   Breastfeeding? No    CONSTITUTIONAL:  awake, alert, cooperative, no apparent distress,  and appears stated age.  She appears depressed  EYES: pupils equal, round and reactive to light, extra-ocular muscles intact, sclera clear, conjunctiva normal and discs are sharp bilaterally  ENT:  normocepalic, without obvious abnormality, good dentition, oral pharynx with moist mucus membranes, tongue normal  NECK:  no lymphadenopathy, thyroid not enlarged, symmetric, no tenderness  BACK:  no kyphosis and no scoliosis  LUNGS:  no increased work of breathing, good air exchange, clear to auscultation and normal percussion bilaterally  CARDIOVASCULAR:  Apical impulse in the 5th ICS, LMCL, regular rate and rhythm, normal S1 and S2, no S3, no S4, no murmur noted and carotids without bruits bilaterally  ABDOMEN:  normal bowel sounds, soft, non-distended,  generally tender without guarding or rebound and no masses  palpated  CHEST/BREASTS:  Deferred  GENITAL/URINARY:  Deferred  MUSCULOSKELETAL:             JOINT COUNT:  RIGHT Swell Tender ROM LEFT Swell Tender ROM   DIP2  0  0 FULL    0  0  FULL   DIP3  0  0  FULL    0  0  FULL   DIP4  0  0  FULL    0  0  FULL   DIP5  0  0  FULL    0  0  FULL   PIP1  0  0  FULL    0  0  FULL   PIP2  0  0  FULL    0  0  FULL   PIP3  0  0  FULL    0  0  FULL   PIP4  0  0  FULL    0  0  FULL   PIP5  0  0 FULL     0  0  FULL   MCP1  0  0  FULL    0  0  FULL   MCP2  0  0  FULL    0  0  FULL   MCP3  0  0  FULL    0  0  FULL   MCP4  0  0  FULL    0  0  FULL   MCP5  0  0  FULL    0  0  FULL   Wrist  0  0  FULL    0  0 FULL    Elbow  0  0  FULL    0  0  FULL   Shouldr  0  0  FULL    0  0  FULL   Hip  0  0  FULL    0  0  FULL   Knee  0 + Crepitus    0 + FULL    Ankle  0  0  FULL    0  0  FULL   MTP1  0  0  FULL    0  0  FULL   MTP2  0 + FULL     0 +  FULL   MTP3  0 +  FULL    0 +  FULL   MTP4  0 +  FULL    0 +  FULL   MTP5  0  0  FULL    0  0  FULL   IP1  0  0  FULL    0  0 FULL  IP2  0  0 FULL     0  0  FULL   IP3  0  0 FULL     0  0  FULL   IP4  0  0  FULL    0  0 FULL    IP5  0 0  FULL    0    0    FULL     Tender Points:  Tender points Right Left   OCCIPUT           + +   Supraspinatus + +   Low Cervical + +   Trapezius + +   Gluteal + +   Second rib + +   Lateral epicondyle + +   Greater Trochanter + +   Knees + +                NEUROLOGIC:  Mental Status Exam:  Level of Alertness:   awake  Cranial Nerves:  cranial nerves II-XII are grossly intact  Motor Exam:  Motor exam is symmetrical 5 out of 5 all extremities bilaterally  Sensory:  Hypersensitive to soft touch  Deep Tendon Reflexes:  Reflexes are intact and symmetrical bilaterally  SKIN:  There is erythema with dilated vessels on her cheeks as well as erythema in the eyebrow and nasolabial folds        IMPRESSION:    Fibromyalgia  Rosacea    RECOMMENDATIONS:  I had about a 60 minute consultation with the patient and her husband.  I after all  their questions and gave him information to read about fibromyalgia.  I recommended that she continue with Cymbalta but increased to 60 mg after about another 2 weeks.  I'm going to add gabapentin titrating from 300 mg to 900 mg over the next 3 weeks.  For the rosacea I've given her a prescription for MetroGel.  Followup in a week's period    Teressa Senter MD  [facility]

## 2012-03-17 NOTE — Patient Instructions (Addendum)
Cymbalta--if after 2 more weeks you are tolerating it, increase to 60 mg.  Gabapentin 300 .  Melatonin 10 mg bedtime  Gluten-Free Diet: After Your Visit  Your Care Instructions  Your doctor has recommended that you start a gluten-free diet to help your symptoms. This means not eating foods that contain gluten. Gluten is a kind of protein found in wheat, barley, and rye.  By following a gluten-free diet, you can manage your symptoms, prevent long-term problems, and still get the nutrition you need.  Follow-up care is a key part of your treatment and safety. Be sure to make and go to all appointments, and call your doctor if you are having problems. It's also a good idea to know your test results and keep a list of the medicines you take.  How can you care for yourself at home?  ?? Don't eat any foods that contain gluten. These include bagels, bread, crackers, malted breakfast cereals, pasta, and pizza.  ?? Carefully read food labels. Look for wheat or wheat products added to foods such as ice cream, salad dressing, candy, canned and frozen soups and vegetables, and other processed foods.  ?? Avoid all beer products unless the label says they are gluten-free. Beers with and without alcohol, including lagers, ales, and stouts, contain gluten unless the labels specifically say they are gluten-free.  ?? When you eat out, look for restaurants that serve gluten-free food. You might ask if the chef is familiar with cooking without any gluten. Also look for grocery stores that sell gluten-free pizza and other foods. The Internet can be another source of information on gluten-free foods.  ?? On a gluten-free eating plan, you can still have:  ?? Eggs and milk products such as cheese. Some cheese and cheese spreads may contain gluten, so check the labels for additives. You may need to avoid milk and milk products at the beginning of treatment.  ?? Flours and starches made from rice, corn, buckwheat, potatoes, soybeans, or  tapioca.  ?? Fresh, frozen, or canned unprocessed meats. Examples of processed meats are hot dogs, salami, and deli meat. Read labels for additives that may contain gluten.  ?? Fresh, frozen, dried, or canned fruits and vegetables, if they do not have thickeners or other additives that contain gluten.  ?? Certain alcohol drinks, including wine, liquor (including whiskey and brandy), liqueurs, and ciders.  When should you call for help?  Watch closely for changes in your health, and be sure to contact your doctor if:  ?? You have unexplained weight loss.  ?? You have diarrhea that lasts longer than 1 to 2 weeks.  ?? You have unusual fatigue or mood changes, especially if these last more than a week and are not related to any other illness, such as the flu.  ?? Your symptoms come back again.  ?? Your stomach pain gets worse.   Where can you learn more?   Go to [email or web address] and sign in to your MyChart account. Enter O549 in the Search Health Information box to learn more about ???Gluten-Free Diet: After Your Visit.???    If you do not have an account, please click on the ???Sign Up Now??? link.     ?? 2006-2013 [employer]. Care instructions adapted under license by [facility]. This care instruction is for use with your licensed healthcare professional. If you have questions about a medical condition or this instruction, always ask your healthcare professional. [employer] Inc[employer] disclaims any warranty or liability for your  use of this information.  Content Version: 9.7.130178; Last Revised: May 06, 2009

## 2012-03-18 LAB — C-REACTIVE PROTEIN: CRP: 3.8 mg/L (ref 0.0–5.1)

## 2012-03-18 LAB — SEDIMENTATION RATE: Sed Rate: 8 mm/Hr (ref 0–30)

## 2012-03-24 ENCOUNTER — Encounter (HOSPITAL_COMMUNITY): Payer: Medicare Other

## 2012-03-27 ENCOUNTER — Other Ambulatory Visit: Payer: Self-pay | Admitting: Family Medicine

## 2012-03-27 DIAGNOSIS — Z1231 Encounter for screening mammogram for malignant neoplasm of breast: Secondary | ICD-10-CM

## 2012-03-28 ENCOUNTER — Ambulatory Visit (HOSPITAL_COMMUNITY)
Admission: RE | Admit: 2012-03-28 | Discharge: 2012-03-28 | Disposition: A | Discharge status: Home / Self Care | Payer: Medicare Other | Source: Ambulatory Visit | Attending: Internal Medicine | Admitting: Internal Medicine

## 2012-03-28 DIAGNOSIS — I872 Venous insufficiency (chronic) (peripheral): Secondary | ICD-10-CM | POA: Insufficient documentation

## 2012-03-28 NOTE — Progress Notes (Signed)
Bilateral Venous Duplex Completed for Venous Insuffiencey. Melanie Lucas

## 2012-04-08 MED ORDER — DULOXETINE HCL 60 MG PO CPEP
60 MG | ORAL_CAPSULE | Freq: Every day | ORAL | Status: DC
Start: 2012-04-08 — End: 2012-09-11

## 2012-04-08 MED ORDER — GABAPENTIN 300 MG PO CAPS
300 MG | ORAL_CAPSULE | Freq: Three times a day (TID) | ORAL | Status: DC
Start: 2012-04-08 — End: 2012-09-11

## 2012-04-08 NOTE — Telephone Encounter (Signed)
Pt called with several questions and needs:  1) she is currently taking gabapentin 900 mg tid. She is still having pain. She is wondering if the dose needs to be bumped up? Seems to not be working well.   2) she was advised that she can take tylenol 3000 mg daily. She states that she has side effects from taking 2 tablets at a time. She is wondering if it would be ok to take 1 tablet 6 times a day.   3) she states that she only has 3 days left of her cymbalta 30mg . She is asking to get 60 mg called into her pharmacy as dr advised per appt.   4) she is on a Gluten Free diet. She states that her stomach is feeling better. She is wondering if the meds or the diet helped? She is wondering if she is to stay on the diet?   5) she is wondering about her lab results. She states that there is nothing in [place] .

## 2012-04-08 NOTE — Telephone Encounter (Signed)
Pt. Informed.

## 2012-04-08 NOTE — Telephone Encounter (Signed)
She is on pretty much a full dose of gabapentin no change.  She may take one Tylenol 6 times a day as long as he stays at the 3000 mg maximum in is no reason to change her gluten-free diet if that helps.

## 2012-04-25 ENCOUNTER — Ambulatory Visit: Payer: Medicare Other

## 2012-05-12 ENCOUNTER — Ambulatory Visit: Payer: Medicare Other

## 2012-05-12 ENCOUNTER — Ambulatory Visit
Admission: RE | Admit: 2012-05-12 | Discharge: 2012-05-12 | Disposition: A | Discharge status: Home / Self Care | Payer: Medicare Other | Source: Ambulatory Visit | Attending: Family Medicine | Admitting: Family Medicine

## 2012-05-12 DIAGNOSIS — Z1231 Encounter for screening mammogram for malignant neoplasm of breast: Secondary | ICD-10-CM

## 2012-05-12 MED ORDER — IBUPROFEN 100 MG/5ML PO SUSP
100 MG/5ML | ORAL | Status: DC | PRN
Start: 2012-05-12 — End: 2012-09-11

## 2012-05-12 MED ORDER — AZELAIC ACID 15 % EX GEL
15 % | CUTANEOUS | Status: AC
Start: 2012-05-12 — End: ?

## 2012-05-12 NOTE — Patient Instructions (Signed)
Continue with your current medications.

## 2012-05-12 NOTE — Progress Notes (Signed)
[facility]  Destiny Hacker MD   Progress Note      SUBJECTIVE:  The patient is noted that Tylenol causes her to have palpitations.  However ibuprofen seems to help her pain.  She has trouble swallowing pills and would like the liquid ibuprofen.    The metronidazole was too expensive for her rosacea.  We will try something else.    She's lost 14 pounds.  She's exercising regularly.    Review of systems:  Constitutional: Denies weight gain or loss, fever, chills or sweats.  ENT: No hearing loss tinnitus or recent infection.  Chest: No shortness of breath, cough, wheezing or sputum production.  Cor: Denies chest pain or anginal symptoms. No heart murmur. No history of hypertension.  Abdomen: Denies nausea, vomiting, diarrhea or bleeding.  Extremities: History of osteoarthritis  Skin: No rash.    Allergies:  Advair diskus    OBJECTIVE    Filed Vitals:    05/12/12 1356   BP: 124/82   Pulse: 64     Constitutional:  Well-developed individual in no acute distress.    Normal gait and posture.  Hygiene appears to be normal.   No evidence of overt muscular wasting.  Weight is stable.    Neck Supple with full ROM. No thyromegaly.  Chest: Symmetrical expansion.   Clear to percussion and auscultation  Cor: Apical impulse 5th ICS LMCL          S1 and S2 are normal. There are no murrurs, rubs or gallops           There are no carotid bruits           There is no edema  Abdomen: Soft and nontender. BS are active                    There is no organomegaly.  Extremities:     JOINT COUNT:   RIGHT  Swell  Tender  ROM  LEFT  Swell  Tender  ROM    DIP2  0  0  FULL   0  0  FULL    DIP3  0  0  FULL   0  0  FULL    DIP4  0  0  FULL   0  0  FULL    DIP5  0  0  FULL   0  0  FULL    PIP1  0  0  FULL   0  0  FULL    PIP2  0  0  FULL   0  0  FULL    PIP3  0  0  FULL   0  0  FULL    PIP4  0  0  FULL   0  0  FULL    PIP5  0  0  FULL   0  0  FULL    MCP1  0  0  FULL   0  0  FULL    MCP2  0  0  FULL   0  0  FULL    MCP3  0  0  FULL   0  0  FULL    MCP4   0  0  FULL   0  0  FULL    MCP5  0  0  FULL   0  0  FULL    Wrist  0  0  FULL   0  0  FULL    Elbow  0  0  FULL   0  0  FULL    Shouldr  0  0  FULL   0  0  FULL    Hip  0  0  FULL   0  0  FULL    Knee  0  +  Crepitus   0  +  FULL    Ankle  0  0  FULL   0  0  FULL    MTP1  0  0  FULL   0  0  FULL    MTP2  0  +  FULL   0  +  FULL    MTP3  0  +  FULL   0  +  FULL    MTP4  0  +  FULL   0  +  FULL    MTP5  0  0  FULL   0  0  FULL    IP1  0  0  FULL   0  0  FULL    IP2  0  0  FULL   0  0  FULL    IP3  0  0  FULL   0  0  FULL    IP4  0  0  FULL   0  0  FULL    IP5  0  0  FULL   0  0  FULL    Tender Points:   Tender points  Right  Left     OCCIPUT  +  +     Supraspinatus  +  +     Low Cervical  +  +     Trapezius  +  +     Gluteal  +  +     Second rib  +  +     Lateral epicondyle  +  +     Greater Trochanter  +  +     Knees  +  +                   JOINT COUNT:  RIGHT Swell Tender ROM LEFT Swell Tender ROM   DIP2  0  0  FULL    0  0  FULL   DIP3  0  0  FULL    0  0  FULL   DIP4  0  0  FULL    0  0  FULL   DIP5  0  0  FULL    0  0  FULL   PIP1  0  0  FULL    0  0  FULL   PIP2  0  0  FULL    0  0  FULL   PIP3  0  0  FULL    0  0  FULL   PIP4  0  0  FULL    0  0  FULL   PIP5  0  0 FULL     0  0  FULL   MCP1  0  0  FULL    0  0  FULL   MCP2  0  0  FULL    0  0  FULL   MCP3  0  0  FULL    0  0  FULL   MCP4  0  0  FULL    0  0  FULL   MCP5  0  0  FULL    0  0  FULL   Wrist  0  0  FULL    0  0 FULL    Elbow  0  0  FULL    0  0  FULL   Shouldr  0  0  FULL    0  0  FULL   Hip  0  0  FULL    0  0  FULL   Knee  0  0  FULL    0  0 FULL    Ankle  0  0  FULL    0  0  FULL   MTP1  0  0  FULL    0  0  FULL   MTP2  0  0 FULL     0  0  FULL   MTP3  0  0  FULL    0  0  FULL   MTP4  0  0  FULL    0  0  FULL   MTP5  0  0  FULL    0  0  FULL   IP1  0  0  FULL    0  0 FULL    IP2  0  0 FULL     0  0  FULL   IP3  0  0 FULL     0  0  FULL   IP4  0  0  FULL    0  0 FULL    IP5  0 0  FULL    0    0    FULL            ASSESSMENT  Fibromyalgia  Rosacea  Obesity     PLAN    She is to continue with her weight loss.  She is to continue with Cymbalta and gabapentin.  She may use liquid ibuprofen.  Followup in 4 months.

## 2012-05-14 ENCOUNTER — Other Ambulatory Visit: Payer: Self-pay | Admitting: Family Medicine

## 2012-05-14 DIAGNOSIS — R928 Other abnormal and inconclusive findings on diagnostic imaging of breast: Secondary | ICD-10-CM

## 2012-05-26 ENCOUNTER — Ambulatory Visit
Admission: RE | Admit: 2012-05-26 | Discharge: 2012-05-26 | Disposition: A | Discharge status: Home / Self Care | Payer: Medicare Other | Source: Ambulatory Visit | Attending: Family Medicine | Admitting: Family Medicine

## 2012-05-26 DIAGNOSIS — R928 Other abnormal and inconclusive findings on diagnostic imaging of breast: Secondary | ICD-10-CM

## 2012-09-11 MED ORDER — ESZOPICLONE 2 MG PO TABS
2 MG | ORAL_TABLET | Freq: Every evening | ORAL | Status: AC
Start: 2012-09-11 — End: 2013-09-11

## 2012-09-11 MED ORDER — DULOXETINE HCL 60 MG PO CPEP
60 MG | ORAL_CAPSULE | Freq: Every day | ORAL | Status: DC
Start: 2012-09-11 — End: 2013-07-20

## 2012-09-11 MED ORDER — GABAPENTIN 300 MG PO CAPS
300 MG | ORAL_CAPSULE | Freq: Two times a day (BID) | ORAL | Status: DC
Start: 2012-09-11 — End: 2012-11-18

## 2012-09-11 NOTE — Progress Notes (Signed)
[facility]  [name] MD   Progress Note      SUBJECTIVE:  The patient is having increasing mid and lower back pain.  She is not on Cymbalta but is currently taking 900 mg of gabapentin daily.  There are no radicular symptoms.    He also complains about trouble initiating sleep.  She notes that her days are completely turned around now.    Review of systems:  Constitutional: Denies weight gain or loss, fever, chills or sweats.  ENT: No hearing loss tinnitus or recent infection.  Chest: No shortness of breath, cough, wheezing or sputum production.  Cor: Denies chest pain or anginal symptoms. No heart murmur. No history of hypertension.  Abdomen: Denies nausea, vomiting, diarrhea or bleeding.  Extremities: History of osteoarthritis  Skin: No rash.    Allergies:  Advair diskus    OBJECTIVE    Filed Vitals:    09/11/12 1337   BP: 132/86   Pulse: 64     Constitutional:  Well-developed individual in no acute distress.    Normal gait and posture.  Hygiene appears to be normal.   No evidence of overt muscular wasting.  Weight is stable.    Neck Supple with full ROM. No thyromegaly.  Chest: Symmetrical expansion.   Clear to percussion and auscultation  Cor: Apical impulse 5th ICS LMCL          S1 and S2 are normal. There are no murrurs, rubs or gallops           There are no carotid bruits           There is no edema  Abdomen: Soft and nontender. BS are active                    There is no organomegaly.  Extremities:     JOINT COUNT:   RIGHT  Swell  Tender  ROM  LEFT  Swell  Tender  ROM    DIP2  0  0  FULL   0  0  FULL    DIP3  0  0  FULL   0  0  FULL    DIP4  0  0  FULL   0  0  FULL    DIP5  0  0  FULL   0  0  FULL    PIP1  0  0  FULL   0  0  FULL    PIP2  0  0  FULL   0  0  FULL    PIP3  0  0  FULL   0  0  FULL    PIP4  0  0  FULL   0  0  FULL    PIP5  0  0  FULL   0  0  FULL    MCP1  0  0  FULL   0  0  FULL    MCP2  0  0  FULL   0  0  FULL    MCP3  0  0  FULL   0  0  FULL    MCP4  0  0  FULL   0  0  FULL    MCP5  0  0   FULL   0  0  FULL    Wrist  0  0  FULL   0  0  FULL    Elbow  0  0  FULL   0  0  FULL    Shouldr  0  0  FULL   0  0  FULL    Hip  0  0  FULL   0  0  FULL    Knee  0  +  Crepitus   0  +  FULL    Ankle  0  0  FULL   0  0  FULL    MTP1  0  0  FULL   0  0  FULL    MTP2  0  +  FULL   0  +  FULL    MTP3  0  +  FULL   0  +  FULL    MTP4  0  +  FULL   0  +  FULL    MTP5  0  0  FULL   0  0  FULL    IP1  0  0  FULL   0  0  FULL    IP2  0  0  FULL   0  0  FULL    IP3  0  0  FULL   0  0  FULL    IP4  0  0  FULL   0  0  FULL    IP5  0  0  FULL   0  0  FULL    Tender Points:   Tender points  Right  Left     OCCIPUT  +  +     Supraspinatus  +  +     Low Cervical  +  +     Trapezius  +  +     Gluteal  +  +     Second rib  +  +     Lateral epicondyle  +  +     Greater Trochanter  +  +     Knees  +  +                   JOINT COUNT:  RIGHT Swell Tender ROM LEFT Swell Tender ROM   DIP2  0  0  FULL    0  0  FULL   DIP3  0  0  FULL    0  0  FULL   DIP4  0  0  FULL    0  0  FULL   DIP5  0  0  FULL    0  0  FULL   PIP1  0  0  FULL    0  0  FULL   PIP2  0  0  FULL    0  0  FULL   PIP3  0  0  FULL    0  0  FULL   PIP4  0  0  FULL    0  0  FULL   PIP5  0  0 FULL     0  0  FULL   MCP1  0  0  FULL    0  0  FULL   MCP2  0  0  FULL    0  0  FULL   MCP3  0  0  FULL    0  0  FULL   MCP4  0  0  FULL    0  0  FULL   MCP5  0  0  FULL    0  0  FULL   Wrist  0  0  FULL    0  0 FULL    Elbow  0  0  FULL    0  0  FULL   Shouldr  0  0  FULL    0  0  FULL   Hip  0  0  FULL    0  0  FULL   Knee  0  0  FULL    0  0 FULL    Ankle  0  0  FULL    0  0  FULL   MTP1  0  0  FULL    0  0  FULL   MTP2  0  0 FULL     0  0  FULL   MTP3  0  0  FULL    0  0  FULL   MTP4  0  0  FULL    0  0  FULL   MTP5  0  0  FULL    0  0  FULL   IP1  0  0  FULL    0  0 FULL    IP2  0  0 FULL     0  0  FULL   IP3  0  0 FULL     0  0  FULL   IP4  0  0  FULL    0  0 FULL    IP5  0 0  FULL    0    0    FULL           ASSESSMENT  Fibromyalgia  Sleep disorder  Obesity     PLAN    The patient  has lost weight.  She's continuing to diet.  For the fibromyalgia I will increase gabapentin 1200 mg daily and restart Cymbalta.  I've given her prescription for Lunesta to use it her sleep does not improve. She is to followup in 3 months.

## 2012-09-11 NOTE — Patient Instructions (Signed)
Increase gabapentin and restart Cymbalta

## 2012-09-30 ENCOUNTER — Encounter: Payer: Self-pay | Admitting: Internal Medicine

## 2012-10-14 ENCOUNTER — Other Ambulatory Visit: Payer: Self-pay | Admitting: Gastroenterology

## 2012-11-10 ENCOUNTER — Inpatient Hospital Stay (HOSPITAL_COMMUNITY): Payer: Medicare Other

## 2012-11-10 ENCOUNTER — Encounter (HOSPITAL_COMMUNITY): Payer: Self-pay | Admitting: Emergency Medicine

## 2012-11-10 ENCOUNTER — Emergency Department (HOSPITAL_COMMUNITY): Payer: Medicare Other

## 2012-11-10 ENCOUNTER — Inpatient Hospital Stay (HOSPITAL_COMMUNITY)
Admission: EM | Admit: 2012-11-10 | Discharge: 2012-11-11 | DRG: 310 | Disposition: A | Discharge status: Home / Self Care | Payer: Medicare Other | Attending: Cardiology | Admitting: Cardiology

## 2012-11-10 DIAGNOSIS — R079 Chest pain, unspecified: Secondary | ICD-10-CM

## 2012-11-10 DIAGNOSIS — Z6833 Body mass index (BMI) 33.0-33.9, adult: Secondary | ICD-10-CM

## 2012-11-10 DIAGNOSIS — E876 Hypokalemia: Secondary | ICD-10-CM | POA: Diagnosis present

## 2012-11-10 DIAGNOSIS — I2510 Atherosclerotic heart disease of native coronary artery without angina pectoris: Secondary | ICD-10-CM | POA: Diagnosis present

## 2012-11-10 DIAGNOSIS — J4489 Other specified chronic obstructive pulmonary disease: Secondary | ICD-10-CM | POA: Diagnosis present

## 2012-11-10 DIAGNOSIS — E119 Type 2 diabetes mellitus without complications: Secondary | ICD-10-CM | POA: Diagnosis present

## 2012-11-10 DIAGNOSIS — H8109 Meniere's disease, unspecified ear: Secondary | ICD-10-CM | POA: Diagnosis present

## 2012-11-10 DIAGNOSIS — Z79899 Other long term (current) drug therapy: Secondary | ICD-10-CM

## 2012-11-10 DIAGNOSIS — E669 Obesity, unspecified: Secondary | ICD-10-CM | POA: Diagnosis present

## 2012-11-10 DIAGNOSIS — J449 Chronic obstructive pulmonary disease, unspecified: Secondary | ICD-10-CM | POA: Diagnosis present

## 2012-11-10 DIAGNOSIS — I10 Essential (primary) hypertension: Secondary | ICD-10-CM | POA: Diagnosis present

## 2012-11-10 DIAGNOSIS — I890 Lymphedema, not elsewhere classified: Secondary | ICD-10-CM | POA: Diagnosis present

## 2012-11-10 DIAGNOSIS — I4891 Unspecified atrial fibrillation: Principal | ICD-10-CM | POA: Diagnosis present

## 2012-11-10 DIAGNOSIS — F17200 Nicotine dependence, unspecified, uncomplicated: Secondary | ICD-10-CM

## 2012-11-10 DIAGNOSIS — M1990 Unspecified osteoarthritis, unspecified site: Secondary | ICD-10-CM | POA: Diagnosis present

## 2012-11-10 DIAGNOSIS — E785 Hyperlipidemia, unspecified: Secondary | ICD-10-CM | POA: Diagnosis present

## 2012-11-10 DIAGNOSIS — Z8673 Personal history of transient ischemic attack (TIA), and cerebral infarction without residual deficits: Secondary | ICD-10-CM

## 2012-11-10 HISTORY — DX: Tobacco use: Z72.0

## 2012-11-10 HISTORY — DX: Other cerebral infarction due to occlusion or stenosis of small artery: I63.81

## 2012-11-10 HISTORY — DX: Other specified abnormal findings of blood chemistry: R79.89

## 2012-11-10 LAB — MAGNESIUM: Magnesium: 2.2 mg/dL (ref 1.5–2.5)

## 2012-11-10 LAB — PROTIME-INR: INR: 0.98 (ref 0.00–1.49)

## 2012-11-10 LAB — POCT I-STAT TROPONIN I: Troponin i, poc: 0 ng/mL (ref 0.00–0.08)

## 2012-11-10 LAB — CBC
HCT: 44.5 % (ref 36.0–46.0)
Hemoglobin: 16 g/dL — ABNORMAL HIGH (ref 12.0–15.0)
RBC: 4.96 MIL/uL (ref 3.87–5.11)
WBC: 9.6 10*3/uL (ref 4.0–10.5)

## 2012-11-10 LAB — GLUCOSE, CAPILLARY: Glucose-Capillary: 270 mg/dL — ABNORMAL HIGH (ref 70–99)

## 2012-11-10 LAB — BASIC METABOLIC PANEL
BUN: 11 mg/dL (ref 6–23)
CO2: 25 mEq/L (ref 19–32)
Chloride: 103 mEq/L (ref 96–112)
Glucose, Bld: 248 mg/dL — ABNORMAL HIGH (ref 70–99)
Potassium: 3.7 mEq/L (ref 3.5–5.1)

## 2012-11-10 LAB — TROPONIN I
Troponin I: <0.3 ng/mL (ref <0.30)
Troponin I: <0.3 ng/mL (ref <0.30)
Troponin I: <0.3 ng/mL (ref <0.30)

## 2012-11-10 MED ORDER — DILTIAZEM HCL 100 MG IV SOLR
5.0000 mg/h | INTRAVENOUS | Status: DC
  Administered 2012-11-10 (×2): 5 mg/h via INTRAVENOUS
  Filled 2012-11-10 (×2): qty 100

## 2012-11-10 MED ORDER — ATORVASTATIN CALCIUM 40 MG PO TABS: 40.0000 mg | ORAL_TABLET | Freq: Every day | ORAL | Status: DC

## 2012-11-10 MED ORDER — GLIMEPIRIDE 4 MG PO TABS
4.0000 mg | ORAL_TABLET | Freq: Every day | ORAL | Status: DC
Start: 2012-11-11 — End: 2012-11-11
  Administered 2012-11-11: 4 mg via ORAL
  Filled 2012-11-10 (×2): qty 1

## 2012-11-10 MED ORDER — ROSUVASTATIN CALCIUM 20 MG PO TABS
20.0000 mg | ORAL_TABLET | Freq: Every day | ORAL | Status: DC
  Administered 2012-11-10: 20 mg via ORAL
  Filled 2012-11-10 (×2): qty 1

## 2012-11-10 MED ORDER — DILTIAZEM HCL 100 MG IV SOLR
5.0000 mg/h | INTRAVENOUS | Status: DC
  Filled 2012-11-10 (×2): qty 100

## 2012-11-10 MED ORDER — HYDROCHLOROTHIAZIDE 25 MG PO TABS
25.0000 mg | ORAL_TABLET | Freq: Every day | ORAL | Status: DC
  Administered 2012-11-10 – 2012-11-11 (×2): 25 mg via ORAL
  Filled 2012-11-10 (×2): qty 1

## 2012-11-10 MED ORDER — SODIUM CHLORIDE 0.9 % IV SOLN
INTRAVENOUS | Status: AC
  Administered 2012-11-10: 20 mL/h via INTRAVENOUS

## 2012-11-10 MED ORDER — POTASSIUM CHLORIDE CRYS ER 20 MEQ PO TBCR
40.0000 meq | EXTENDED_RELEASE_TABLET | Freq: Once | ORAL | Status: AC
  Administered 2012-11-10: 40 meq via ORAL
  Filled 2012-11-10: qty 2

## 2012-11-10 MED ORDER — DIAZEPAM 2 MG PO TABS: 2.0000 mg | ORAL_TABLET | ORAL | Status: DC | PRN

## 2012-11-10 MED ORDER — DILTIAZEM LOAD VIA INFUSION
20.0000 mg | Freq: Once | INTRAVENOUS | Status: AC
  Administered 2012-11-10: 20 mg via INTRAVENOUS
  Filled 2012-11-10: qty 20

## 2012-11-10 MED ORDER — ONE-DAILY MULTI VITAMINS PO TABS: 1.0000 | ORAL_TABLET | Freq: Every day | ORAL | Status: DC

## 2012-11-10 MED ORDER — ASPIRIN 325 MG PO TABS
325.0000 mg | ORAL_TABLET | ORAL | Status: AC
  Administered 2012-11-10: 325 mg via ORAL
  Filled 2012-11-10: qty 1

## 2012-11-10 MED ORDER — APIXABAN 5 MG PO TABS
5.0000 mg | ORAL_TABLET | Freq: Two times a day (BID) | ORAL | Status: DC
  Administered 2012-11-10 – 2012-11-11 (×3): 5 mg via ORAL
  Filled 2012-11-10 (×4): qty 1

## 2012-11-10 MED ORDER — DILTIAZEM HCL 25 MG/5ML IV SOLN: 20.0000 mg | Freq: Once | INTRAVENOUS | Status: DC

## 2012-11-10 MED ORDER — INSULIN ASPART 100 UNIT/ML ~~LOC~~ SOLN
0.0000 [IU] | Freq: Three times a day (TID) | SUBCUTANEOUS | Status: DC
  Administered 2012-11-10: 8 [IU] via SUBCUTANEOUS
  Administered 2012-11-11: 3 [IU] via SUBCUTANEOUS
  Administered 2012-11-11: 5 [IU] via SUBCUTANEOUS

## 2012-11-10 MED ORDER — DILTIAZEM HCL 30 MG PO TABS
30.0000 mg | ORAL_TABLET | Freq: Four times a day (QID) | ORAL | Status: DC
  Administered 2012-11-10 – 2012-11-11 (×3): 30 mg via ORAL
  Filled 2012-11-10 (×6): qty 1

## 2012-11-10 MED ORDER — ONDANSETRON HCL 4 MG/2ML IJ SOLN: 4.0000 mg | Freq: Four times a day (QID) | INTRAMUSCULAR | Status: DC | PRN

## 2012-11-10 MED ORDER — DILTIAZEM HCL 100 MG IV SOLR: 5.0000 mg/h | INTRAVENOUS | Status: DC

## 2012-11-10 MED ORDER — ACETAMINOPHEN 325 MG PO TABS: 650.0000 mg | ORAL_TABLET | ORAL | Status: DC | PRN

## 2012-11-10 MED ORDER — DILTIAZEM HCL 30 MG PO TABS
30.0000 mg | ORAL_TABLET | Freq: Four times a day (QID) | ORAL | Status: DC
  Administered 2012-11-10: 30 mg via ORAL
  Filled 2012-11-10 (×5): qty 1

## 2012-11-10 MED ORDER — ADULT MULTIVITAMIN W/MINERALS CH
1.0000 | ORAL_TABLET | Freq: Every day | ORAL | Status: DC
  Administered 2012-11-10 – 2012-11-11 (×2): 1 via ORAL
  Filled 2012-11-10 (×2): qty 1

## 2012-11-10 MED ORDER — TIOTROPIUM BROMIDE MONOHYDRATE 18 MCG IN CAPS
18.0000 ug | ORAL_CAPSULE | Freq: Every day | RESPIRATORY_TRACT | Status: DC | PRN
  Filled 2012-11-10: qty 5

## 2012-11-10 MED ORDER — DRONEDARONE HCL 400 MG PO TABS
400.0000 mg | ORAL_TABLET | Freq: Every day | ORAL | Status: DC
  Administered 2012-11-10 – 2012-11-11 (×2): 400 mg via ORAL
  Filled 2012-11-10 (×2): qty 1

## 2012-11-10 NOTE — ED Provider Notes (Signed)
Leb card/Trish called back again, indicating they are admitted at [facility], to tele bed, and request we to transfer orders to facilitate transfer to tele inpt bed. Trish indicates Dr Antoine Poche is the accepting MD.  Pt/family agreeable w plan.  Pt alert, content. Denies any pain. Initial trop normal/0. Pt in afib, current hr 106, bp normal, pulse ox 99%. No increased wob. No faintness.  Pt appears stable for transfer to inpt bed at [facility].        Suzi Roots, MD 11/10/12 [phone]

## 2012-11-10 NOTE — ED Notes (Signed)
[facility] called. 

## 2012-11-10 NOTE — ED Notes (Signed)
Glick, MD at bedside.  

## 2012-11-10 NOTE — ED Notes (Signed)
Report called RN 3w

## 2012-11-10 NOTE — ED Provider Notes (Signed)
CSN: [id number]     Arrival date & time 11/10/12  0530 History   First MD Initiated Contact with Patient 11/10/12 [phone]     Chief Complaint  Patient presents with  . Chest Pain   (Consider location/radiation/quality/duration/timing/severity/associated sxs/prior Treatment) Patient is a 62 y.o. female presenting with chest pain. The history is provided by the patient.  Chest Pain She noted onset chest pain at 5 AM. Pain woke her up. Patient is actually a pressure sensation and moderate in intensity. She rates it at 5/10. Nothing makes it better nothing makes it worse. There is associated palpitations like her heart is jumping out of her chest. There's mild dyspnea, but no nausea or diaphoresis. This is similar to what she had with an episode of atrial fibrillation about 5 years ago. She does not take aspirin. She's no longer on any coagulants. She has had urinary frequency since waking up.  Past Medical History  Diagnosis Date  . Diabetes mellitus   . Hypertension   . Paroxysmal atrial fibrillation   . COPD (chronic obstructive pulmonary disease)   . Obesity   . Chronic acquired lymphedema   . Osteoarthritis   . Meniere disease   . CVD (cardiovascular disease)   . Hematuria     on pradaxa,  she reports that she has seen Dr Elba Barman and had low risk cystoscopy   History reviewed. No pertinent past surgical history. Family History  Problem Relation Age of Onset  . Heart attack Father   . Coronary artery disease Father     strong family hx  . Leukemia Brother   . Cancer Mother     bladder   History  Substance Use Topics  . Smoking status: Current Every Day Smoker -- 1.00 packs/day for 46 years    Types: Cigarettes  . Smokeless tobacco: Never Used     Comment: not ready to quit  . Alcohol Use: No   OB History   Grav Para Term Preterm Abortions TAB SAB Ect Mult Living                 Review of Systems  Cardiovascular: Positive for chest pain.  All other systems reviewed and are  negative.    Allergies  Sulfonamide derivatives  Home Medications   Current Outpatient Rx  Name  Route  Sig  Dispense  Refill  . diazepam (VALIUM) 2 MG tablet   Oral   Take 2 mg by mouth as needed.           . dronedarone (MULTAQ) 400 MG tablet   Oral   Take 400 mg by mouth daily.          [name] glimepiride (AMARYL) 4 MG tablet   Oral   Take 4 mg by mouth daily before breakfast.           . hydrochlorothiazide (HYDRODIURIL) 25 MG tablet   Oral   Take 25 mg by mouth daily.           [name] ibuprofen (ADVIL,MOTRIN) 200 MG tablet   Oral   Take 200 mg by mouth every 6 (six) hours as needed.           . metFORMIN (GLUCOPHAGE XR) 500 MG 24 hr tablet   Oral   Take 1,000 mg by mouth daily with breakfast.          . Multiple Vitamin (MULTIVITAMIN) tablet   Oral   Take 1 tablet by mouth daily.         [name]  rosuvastatin (CRESTOR) 20 MG tablet   Oral   Take 20 mg by mouth daily.           [name] tiotropium (SPIRIVA) 18 MCG inhalation capsule   Inhalation   Place 18 mcg into inhaler and inhale daily as needed.           BP 172/102  Resp 17  SpO2 98% Physical Exam  Nursing note and vitals reviewed.  62 year old female, resting comfortably and in no acute distress. Vital signs are significant for tachycardia with heart rate of 143, and hypertension with blood pressure 172/102. Oxygen saturation is 98%, which is normal. Head is normocephalic and atraumatic. PERRLA, EOMI. Oropharynx is clear. Neck is nontender and supple without adenopathy or JVD. Back is nontender and there is no CVA tenderness. Lungs are clear without rales, wheezes, or rhonchi. Chest is nontender. Heart is tachycardic and irregular without murmur. Abdomen is soft, flat, nontender without masses or hepatosplenomegaly and peristalsis is normoactive. Extremities have 2+ edema, full range of motion is present. Skin is warm and dry without rash. Neurologic: Mental status is normal, cranial nerves are  intact, there are no motor or sensory deficits.  ED Course  Procedures (including critical care time) Labs Review Results for orders placed during the hospital encounter of 11/10/12  CBC      Result Value Range   WBC 9.6  4.0 - 10.5 K/uL   RBC 4.96  3.87 - 5.11 MIL/uL   Hemoglobin 16.0 (*) 12.0 - 15.0 g/dL   HCT 16.1  09.6 - 04.5 %   MCV 89.7  78.0 - 100.0 fL   MCH 32.3  26.0 - 34.0 pg   MCHC 36.0  30.0 - 36.0 g/dL   RDW 40.9  81.1 - 91.4 %   Platelets 199  150 - 400 K/uL  BASIC METABOLIC PANEL      Result Value Range   Sodium 140  135 - 145 mEq/L   Potassium 3.7  3.5 - 5.1 mEq/L   Chloride 103  96 - 112 mEq/L   CO2 25  19 - 32 mEq/L   Glucose, Bld 248 (*) 70 - 99 mg/dL   BUN 11  6 - 23 mg/dL   Creatinine, Ser 7.82  0.50 - 1.10 mg/dL   Calcium 9.9  8.4 - 95.6 mg/dL   GFR calc non Af Amer >90  >90 mL/min   GFR calc Af Amer >90  >90 mL/min  PRO B NATRIURETIC PEPTIDE      Result Value Range   Pro B Natriuretic peptide (BNP) 86.2  0 - 125 pg/mL  PROTIME-INR      Result Value Range   Prothrombin Time 12.8  11.6 - 15.2 seconds   INR 0.98  0.00 - 1.49  TROPONIN I      Result Value Range   Troponin I <0.30  <0.30 ng/mL  POCT I-STAT TROPONIN I      Result Value Range   Troponin i, poc 0.00  0.00 - 0.08 ng/mL   Comment 3            Imaging Review Dg Chest Port 1 View  11/10/2012   CLINICAL DATA:  Acute onset chest pain this morning.  EXAM: PORTABLE CHEST - 1 VIEW  COMPARISON:  PA and lateral chest 06/04/2010.  FINDINGS: Heart size is upper normal with vascular congestion. No consolidative process, pneumothorax or effusion. No focal bony abnormality.  IMPRESSION: No acute disease.   Electronically Signed  By: Drusilla Kanner M.D.   On: 11/10/2012 06:01     Date: 11/10/2012  Rate: 155  Rhythm: atrial fibrillation  QRS Axis: left  Intervals: normal  ST/T Wave abnormalities: ST depression and T wave flattening seen in inferior and anterolateral leads  Conduction  Disutrbances:Incomplete right bundle-branch block  Narrative Interpretation: Atrial fibrillation with rapid ventricular response, incomplete right bundle-branch block, diffuse ST and T changes which are probably rate related. Compared with ECG of 03/22/2009, atrial fibrillation with rapid ventricular response has replaced sinus bradycardia, diffuse ST depression is present which is probably rate related, incomplete right bundle branch block is now present.  Old EKG Reviewed: changes noted  CRITICAL CARE Performed by: Dione Booze Total critical care time: 40 minutes Critical care time was exclusive of separately billable procedures and treating other patients. Critical care was necessary to treat or prevent imminent or life-threatening deterioration. Critical care was time spent personally by me on the following activities: development of treatment plan with patient and/or surrogate as well as nursing, discussions with consultants, evaluation of patient's response to treatment, examination of patient, obtaining history from patient or surrogate, ordering and performing treatments and interventions, ordering and review of laboratory studies, ordering and review of radiographic studies, pulse oximetry and re-evaluation of patient's condition.] MDM   1. Atrial fibrillation with rapid ventricular response   2. Chest pain    Chest pain with new onset atrial fibrillation. Old records are reviewed and she is reported to have had atrial fibrillation in the past and was anticoagulated at one point, but I cannot find the actual episode of atrial fibrillation in the record. She will be given diltiazem intravenously for rate control and serial troponins will be needed.  Heart rate has come down to 110 with diltiazem. With that, her chest tightness has resolved completely as has her dyspnea. Cardiology consultation will be obtained for possible admission since she is already on dronedarone.  Dione Booze,  MD 11/10/12 [phone]

## 2012-11-10 NOTE — H&P (Addendum)
Patient ID: Melanie Lucas MRN: [id number], DOB/AGE: 1950/10/16   Admit date: 11/10/2012  Primary Physician: Neldon Labella, MD Primary Cardiologist: Shela Commons. Allred, MD   Pt. Profile:  63 y/o female with h/o PAF and post-termination pauses who presented to the [facility] ED today with a 4 day h/o R arm tingling, 1 day h/o R leg wkns, and afib with rvr since ~ 3:30 this AM.  Problem List  Past Medical History  Diagnosis Date  . Diabetes mellitus   . Hypertension   . Paroxysmal atrial fibrillation     a. on dronedarone;  b. CHA2DS2VASc = 5 (HTN, DM, h/o lacunar infarct on MRI, Female) ->refused oral anticoagulation after h/o hematuria on pradaxa;  c. 02/2012 Echo: EF 55-60%, mildly dil LA.  [name] COPD (chronic obstructive pulmonary disease)   . Obesity   . Chronic acquired lymphedema   . Osteoarthritis   . Meniere disease   . CVD (cardiovascular disease)   . Hematuria     a. while on pradaxa,  she reports that she has seen Dr Elba Barman and had low risk cystoscopy  . Tobacco abuse   . Lacunar infarction     a. 02/2009 non-acute Lacunar infarct of the right thalamus noted on MRI of brain.    History reviewed. No pertinent past surgical history.   Allergies  Allergies  Allergen Reactions  . Statins Other (See Comments)    Muscle pain  . Sulfonamide Derivatives Hives    Nausea vertigo    HPI  62 y/o female with prior h/o PAF.  She was admitted to [facility] in 02/2009 with PAF and RVR with post-termination pauses.  She was seen by EP and decision was made to initiate antiarrhythmic therapy in an effort to prevent afib and avoid pauses.  She was placed on multaq with success.  At that time, she also reported difficulty reading typed words, which occurred a few days prior to admission.  An MRI was performed and showed a new, though non-acute, R thalamic lacunar infarct.  She was placed on pradaxa therapy prior to discharge.    At some point following d/c, she isn't sure as to the timing, she developed what  is referred to as hematuria in her history.  She was apparently seen by urology and underwent cystoscopy, which was reportedly nl, and she believes she was told that her bleeding was likely uterine in nature.  She says that she never saw GYN after that episode however, and does not believe that this has been formally worked up.  She does not have a GYN doctor.  Following the bleeding episode, pradaxa was d/c'd and she has not been willing to take anything other than ASA since.  She reports chronic aches and pains.  She also has chronic R>L LEE with negative u/s for DVT in 03/2012.  She spends much of her day with her legs in a dependent position, does not where TED hose, and also often misses her AM dose of Lasix.  She was in her USOH until this past weekend, when she began to experience right arm tingling w/o wkns and occasional sharp r arm/hand pain.  This did not significantly limit her activities.  Yesterday, she noted some right leg wkns and instability.  Early this AM, she awoke @ 3:30 with acute tachypalpitations and a fluttering sensation in her chest/throat.  As Ss persisted, she presented to the [facility] ED where she was found to be in afib RVR.  She reports compliance with dronedarone.  She was placed on IV dilt with slowing of rate and symptomatic improvement, but remains in afib.  We've been asked to eval.  Home Medications  Prior to Admission medications   Medication Sig Start Date End Date Taking? Authorizing Provider  diazepam (VALIUM) 2 MG tablet Take 2 mg by mouth as needed for anxiety.    Yes Historical Provider, MD  dronedarone (MULTAQ) 400 MG tablet Take 400 mg by mouth daily.    Yes Historical Provider, MD  glimepiride (AMARYL) 4 MG tablet Take 4 mg by mouth daily before breakfast.     Yes Historical Provider, MD  hydrochlorothiazide (HYDRODIURIL) 25 MG tablet Take 25 mg by mouth daily.     Yes Historical Provider, MD  ibuprofen (ADVIL,MOTRIN) 200 MG tablet Take 600 mg by mouth every 6  (six) hours as needed for pain.    Yes Historical Provider, MD  metFORMIN (GLUCOPHAGE) 500 MG tablet Take 500 mg by mouth 2 (two) times daily with a meal.   Yes Historical Provider, MD  Multiple Vitamin (MULTIVITAMIN) tablet Take 1 tablet by mouth daily.   Yes Historical Provider, MD  rosuvastatin (CRESTOR) 20 MG tablet Take 20 mg by mouth daily.     Yes Historical Provider, MD  tiotropium (SPIRIVA) 18 MCG inhalation capsule Place 18 mcg into inhaler and inhale daily as needed (only when she feels short of breath).    Yes Historical Provider, MD   Family History  Family History  Problem Relation Age of Onset  . Heart attack Father   . Coronary artery disease Father     strong family hx  . Leukemia Brother   . Cancer Mother     bladder   Social History  History   Social History  . Marital Status: Married    Spouse Name: N/A    Number of Children: N/A  . Years of Education: N/A   Occupational History  . Not on file.   Social History Main Topics  . Smoking status: Current Every Day Smoker -- 1.00 packs/day for 46 years    Types: Cigarettes  . Smokeless tobacco: Never Used     Comment: not ready to quit  . Alcohol Use: No  . Drug Use: No  . Sexual Activity: Not on file   Other Topics Concern  . Not on file   Social History Narrative   She operates an entertainment business    Review of Systems General:  No chills, fever, night sweats or weight changes.  Cardiovascular:  +++ palpitations.  No chest pain, dyspnea on exertion, edema, orthopnea, paroxysmal nocturnal dyspnea. Dermatological: No rash, lesions/masses Respiratory: No cough, dyspnea Urologic: polyuria this AM.  No hematuria, dysuria Abdominal:   No nausea, vomiting, diarrhea, bright red blood per rectum, melena, or hematemesis Neurologic:  R arm tingling and R leg wkns yesteray.  Chronic L digit numbness.  No visual changes, changes in mental status. All other systems reviewed and are otherwise negative except  as noted above.  Physical Exam  Blood pressure 144/83, pulse 115, resp. rate 18, SpO2 100.00%.  General: Pleasant, NAD Psych: Normal affect. Neuro: Alert and oriented X 3. Moves all extremities spontaneously.  CN II-XII grossly intact.  Strength 5/5 bilat LE/UE. HEENT: Normal  Neck: Supple without bruits or JVD. Lungs:  Resp regular and unlabored, CTA. Heart: IR, IR, no s3, s4, or murmurs. Abdomen: Soft, non-tender, non-distended, BS + x 4.  Extremities: No clubbing, cyanosis.  2+ R>L bilat LE edema with erythema and heat  noted over anterior RLE. DP/PT/Radials 2+ and equal bilaterally.  Labs   Recent Labs  11/10/12 0539  TROPONINI <0.30   Lab Results  Component Value Date   WBC 9.6 11/10/2012   HGB 16.0* 11/10/2012   HCT 44.5 11/10/2012   MCV 89.7 11/10/2012   PLT 199 11/10/2012    Recent Labs Lab 11/10/12 0539  NA 140  K 3.7  CL 103  CO2 25  BUN 11  CREATININE 0.59  CALCIUM 9.9  GLUCOSE 248*   Lab Results  Component Value Date   CHOL 112 03/03/2012   HDL 39.40 03/03/2012   LDLCALC 40 03/03/2012   TRIG 165.0* 03/03/2012    Radiology/Studies  Dg Chest Port 1 View  11/10/2012   CLINICAL DATA:  Acute onset chest pain this morning.  EXAM: PORTABLE CHEST - 1 VIEW  COMPARISON:  PA and lateral chest 06/04/2010.  FINDINGS: Heart size is upper normal with vascular congestion. No consolidative process, pneumothorax or effusion. No focal bony abnormality.  IMPRESSION: No acute disease.   Electronically Signed   By: Drusilla Kanner M.D.   On: 11/10/2012 06:01   ECG  Afib, 155, diffuse, upsloping ST depression, left axis.  ASSESSMENT AND PLAN  1.  Afib RVR:  Pt presents with after awakening this AM with tachypalpitations and has been found to be in afib with RVR.  She reports compliance with dronedarone at home.  She is currently on IV dilt and rates are in the 1-teens.  She will need to be admitted for further rhythm mgmt and anticoagulation.  She has been on pradaxa in the  past and developed hematuria vs uterine bleeding (not clear from hx - w/u sounds to be incomplete), but has not had bleeding since.  With a CHA2DS2VASc of 5, she should be on anticoagulation and she would be willing to try eliquis.  Cont dronedarone for now and will ask Dr. Johney Frame to review - ? Change to BID vs change to another drug after appropriate washout (~4 days) vs. DCCV.  She does have a h/o post-termination pauses (02/2009).  2.  R arm tingling and R leg wkns:  Tingling developed over the weekend and r leg wkns developed yesterday -  All prior to any Ss of afib.  Neuro exam is nl.  Will check noncontrast head CT prior to initiating anticoagulation.  Will need neuro eval.  She does have h/o lacunar infarcts in past.  3.  ? H/o uterine bleeding vs hematuria:  None since coming off of pradaxa.  Needs outpt GYN f/u.  4.  Tob Abuse:  Cessation advised.  5.  Chronic bilat LEE R>L:  Neg u/s for DVT in 03/2012. She has not noted any recent change in appearance of legs.  She has been advised to wear TEDS in the past and is also on lasix @ home.  She does not wear TEDS and often forgets to take lasix.  Resume lasix in house.  6.  Hypokalemia:  Supp.  Check Mg.  7.  DM:  Hold metformin.  Cont amaryl.  Add SSI.  8.  HL:  On statin.  Check lipids/lft's.  Signed, Nicolasa Ducking, NP 11/10/2012, 10:04 AM   History and all data above reviewed.  Patient examined.  I agree with the findings as above. The patient has symptomatic atrial fib.  She is now back in NSR.  She has had some very atypical right hand tingling.   The patient exam reveals COR:RRR, distant heart sounds  ,  Lungs: Clear,  Neck:  JVD to the jaw at 45 degrees.  ,  Abd: Positive bowel sounds, no rebound no guarding, Ext No edema  .  All available labs, radiology testing, previous records reviewed. Agree with documented assessment and plan. Atrial fib:  Now back in NSR.  I will discontinue the IV cardizem.  Dr. Johney Frame can address further  changes to her Multaq dosing.  She does agree to take Eliquis.  Melanie Lucas has a CHA2DS2 - VASc score of 5 with a risk of stroke of 6.7%  and a HAS - BLED score of 2 with a low risk of bleeding. Of note the patient has JVD with a probable CV wave.  Echo earlier this year did not adequately image the RV or tricuspid valve.  I would suspect TR.  However, she has no new symptoms.   Melanie Lucas  2:31 PM  11/10/2012

## 2012-11-10 NOTE — ED Notes (Signed)
Pt arrived in triage clutching her chest and complaining of chest pain in the upper chest.  Pt is also short of breath.

## 2012-11-10 NOTE — Progress Notes (Signed)
Utilization review completed. Arya Boxley RN CCM Case Mgmt  

## 2012-11-11 ENCOUNTER — Telehealth: Payer: Self-pay | Admitting: *Deleted

## 2012-11-11 ENCOUNTER — Encounter (HOSPITAL_COMMUNITY): Payer: Self-pay | Admitting: Physician Assistant

## 2012-11-11 DIAGNOSIS — I10 Essential (primary) hypertension: Secondary | ICD-10-CM

## 2012-11-11 DIAGNOSIS — F17200 Nicotine dependence, unspecified, uncomplicated: Secondary | ICD-10-CM

## 2012-11-11 LAB — COMPREHENSIVE METABOLIC PANEL
Albumin: 3.1 g/dL — ABNORMAL LOW (ref 3.5–5.2)
BUN: 10 mg/dL (ref 6–23)
Calcium: 8.8 mg/dL (ref 8.4–10.5)
Creatinine, Ser: 0.71 mg/dL (ref 0.50–1.10)
GFR calc Af Amer: >90 mL/min (ref >90)
GFR calc non Af Amer: 89 mL/min — ABNORMAL LOW (ref >90)
Glucose, Bld: 188 mg/dL — ABNORMAL HIGH (ref 70–99)
Sodium: 138 mEq/L (ref 135–145)
Total Protein: 5.8 g/dL — ABNORMAL LOW (ref 6.0–8.3)

## 2012-11-11 LAB — LIPID PANEL
Cholesterol: 115 mg/dL (ref 0–200)
Total CHOL/HDL Ratio: 2.6 RATIO
Triglycerides: 112 mg/dL (ref <150)
VLDL: 22 mg/dL (ref 0–40)

## 2012-11-11 LAB — GLUCOSE, CAPILLARY
Glucose-Capillary: 199 mg/dL — ABNORMAL HIGH (ref 70–99)
Glucose-Capillary: 245 mg/dL — ABNORMAL HIGH (ref 70–99)

## 2012-11-11 LAB — CBC
HCT: 39.4 % (ref 36.0–46.0)
Hemoglobin: 14 g/dL (ref 12.0–15.0)
Platelets: 177 10*3/uL (ref 150–400)
RBC: 4.34 MIL/uL (ref 3.87–5.11)
RDW: 13 % (ref 11.5–15.5)
WBC: 8 10*3/uL (ref 4.0–10.5)

## 2012-11-11 LAB — TROPONIN I: Troponin I: <0.3 ng/mL (ref <0.30)

## 2012-11-11 MED ORDER — APIXABAN 5 MG PO TABS: 5.0000 mg | ORAL_TABLET | Freq: Two times a day (BID) | ORAL | Status: DC

## 2012-11-11 MED ORDER — DILTIAZEM HCL 30 MG PO TABS: 30.0000 mg | ORAL_TABLET | Freq: Four times a day (QID) | ORAL | Status: DC | PRN

## 2012-11-11 MED ORDER — DRONEDARONE HCL 400 MG PO TABS: 400.0000 mg | ORAL_TABLET | Freq: Two times a day (BID) | ORAL | Status: DC

## 2012-11-11 NOTE — Care Management (Signed)
Benefits check in process for eliquis. CM will make pt aware of cost once completed. Gala Lewandowsky, RN,BSN [phone]

## 2012-11-11 NOTE — Discharge Summary (Signed)
Discharge Summary   Patient ID: Melanie Lucas MRN: [id number], DOB/AGE: [birth year] 62 y.o. Admit date: 11/10/2012 D/C date:     11/11/2012  Primary Cardiologist: Kvion Shapley  Primary Discharge Diagnoses:  1. Paroxysmal atrial fibrillation - back in AF after missing Multaq doses, spontaneously converted to NSR - placed on Eliquis this admission 2. HTN 3. Tobacco abuse 4. Right arm tingling and right leg weakness - CT head nonacute, neuro exam nonfocal 5. Elevated TSH - instructed to f/u PCP  Secondary Discharge Diagnoses:  1. DM 2. COPD 3. Obesity BMI 33.3 4. Chronic acquired lymphedema 5. Osteoarthritis 6. Meniere disease 7. Cardiovascular disease 8. Hematuria - while on pradaxa, she reports that she has seen Dr Elba Barman and had low risk cystoscopy  9. Tobacco abuse 10. Lacunar infarction - non-acute Lacunar infarct of the right thalamus noted on MRI of brain  Hospital Course: Melanie Lucas is a 62 y/o F with history of DM, HTN, PAF, lacunar infarct who presented initially to [facility] 11/10/2012 and was transferred to [facility] for further evaluation of atrial fibrillation. She is maintained on Multaq but had only been taking it once per day.  With regard to history, she was admitted in 2011 with PAF-RVR with post-termination pauses. She has been seen by EP and decision was made to initiate antiarrhythmic therapy in an effort to prevent afib and avoid pauses. She was placed on Multaq with success. At that time she also reported difficulty reading typed words a few days prior to admission. An MRI showed a new, though non-acute, R thalamic lacunar infarct. She was placed on Pradaxa. At some point following discharge (pt unsure of timing), she developed what is referred to as hematuria in her history. She was apparently seen by urology and underwent cystoscopy, which was reportedly nl, and she believes she was told that her bleeding was likely uterine in nature. She says that she never saw GYN after that episode  however, and does not believe that this has been formally worked up. She does not have a GYN doctor. Following the bleeding episode, pradaxa was d/c'd and she has not been willing to take anything other than ASA since. She reports chronic aches and pains. She also has chronic R>L LEE with negative u/s for DVT in 03/2012.   She spends much of her day with her legs in a dependent position, does not wear TED hose, and also often misses her AM dose of Lasix. She also reported only taking Multaq once daily. She was in [facility] until this past weekend when she began to develop right arm tingling w/o wkns and occasional sharp r arm/hand pain as well as R leg weakness and instability. Early on the morning of admission (Monday), she developed acute tachypalpitations. In the [facility] ER she was found to be in rapid afib. She was placed on IV diltiazem with symptomatic improvement. She was transferred to [facility] for cardiac monitoring. She spontaneously converted back to NSR. CT head was nonacute She was placed on Eliquis for stroke prevention. TSH was mildly elevated and she was instructed to f/u PCP for further eval. With regard to right arm tingling and right leg weakness, she has had chronic left arm tingling and numbness (3rd/4th/5th digits) for "years." She stated that Dr. Hyacinth Meeker has worked this up previously. She declined inpatient neurology evaluation of R arm tingling/ leg weakness. Neuro exam was unremarkable and symptoms appeared atypical. She was encouraged to follow up with Dr Hyacinth Meeker and to return if they worsen.  Dr. Johney Frame has seen and examined the patient today and feels she is stable for discharge.  She was educated on importance on taking Multaq BID as prescribed. She will also have a new prescription for PRN short-acting diltiazem. She will follow up with Dr. Johney Frame in 4 weeks as well as get plugged into [facility] given new Eliquis. I called her insurance to obtain prior authorization and was approved  for 1 yr. She was instructed to contact her doctor if she develops any unusual bleeding.   Discharge Vitals: Blood pressure 141/74, pulse 89, temperature 98.4 F (36.9 C), temperature source Oral, resp. rate 17, height 5\' 6"  (1.676 m), weight 206 lb (93.441 kg), SpO2 98.00%.  Labs: Lab Results  Component Value Date   WBC 8.0 11/11/2012   HGB 14.0 11/11/2012   HCT 39.4 11/11/2012   MCV 90.8 11/11/2012   PLT 177 11/11/2012     Recent Labs Lab 11/11/12 0605  NA 138  K 3.7  CL 101  CO2 27  BUN 10  CREATININE 0.71  CALCIUM 8.8  PROT 5.8*  BILITOT 0.4  ALKPHOS 81  ALT 15  AST 13  GLUCOSE 188*    Recent Labs  11/10/12 0539 11/10/12 1530 11/10/12 1840 11/11/12 0105  TROPONINI <0.30 <0.30 <0.30 <0.30   Lab Results  Component Value Date   CHOL 115 11/11/2012   HDL 44 11/11/2012   LDLCALC 49 11/11/2012   TRIG 112 11/11/2012     Diagnostic Studies/Procedures   Ct Head Wo Contrast 11/10/2012   CLINICAL DATA:  62 year old female with right upper extremity tingling, right lower extremity weakness. Atrial fibrillation. Stroke versus TIA.  EXAM: CT HEAD WITHOUT CONTRAST  TECHNIQUE: Contiguous axial images were obtained from the base of the skull through the vertex without intravenous contrast.  COMPARISON:  Brain MRI 03/20/2009.  FINDINGS: Visualized paranasal sinuses and mastoids are clear. No acute osseous abnormality identified. Leftward gaze deviation. No acute orbit or scalp soft tissue findings.  Calcified atherosclerosis at the skull base. Asymmetric hypodensity in the left temporal lobe, but may be artifact. Elsewhere in the left hemisphere no changes of acute cortically based infarct identified. No midline shift, mass effect, or evidence of intracranial mass lesion. No acute intracranial hemorrhage identified. No ventriculomegaly. No suspicious intracranial vascular hyperdensity.  IMPRESSION: No acute intracranial hemorrhage or mass effect. Asymmetric hypodensity in the left  temporal lobe, favor artifact. There is leftward gaze deviation, but no definite acute cortically based infarct identified in the low left hemisphere.   Electronically Signed   By: Augusto Gamble M.D.   On: 11/10/2012 12:19   Dg Chest Port 1 View 11/10/2012   CLINICAL DATA:  Acute onset chest pain this morning.  EXAM: PORTABLE CHEST - 1 VIEW  COMPARISON:  PA and lateral chest 06/04/2010.  FINDINGS: Heart size is upper normal with vascular congestion. No consolidative process, pneumothorax or effusion. No focal bony abnormality.  IMPRESSION: No acute disease.   Electronically Signed   By: Drusilla Kanner M.D.   On: 11/10/2012 06:01    Discharge Medications     Medication List         apixaban 5 MG Tabs tablet  Commonly known as:  ELIQUIS  Take 1 tablet (5 mg total) by mouth 2 (two) times daily.     diazepam 2 MG tablet  Commonly known as:  VALIUM  Take 2 mg by mouth as needed for anxiety.     diltiazem 30 MG tablet  Commonly known  as:  CARDIZEM  Take 1 tablet (30 mg total) by mouth every 6 (six) hours as needed (for breakthrough atrial fibrillation/rapid palpitations).     dronedarone 400 MG tablet  Commonly known as:  MULTAQ  Take 1 tablet (400 mg total) by mouth 2 (two) times daily with a meal.     glimepiride 4 MG tablet  Commonly known as:  AMARYL  Take 4 mg by mouth daily before breakfast.     hydrochlorothiazide 25 MG tablet  Commonly known as:  HYDRODIURIL  Take 25 mg by mouth daily.     ibuprofen 200 MG tablet  Commonly known as:  ADVIL,MOTRIN  Take 600 mg by mouth every 6 (six) hours as needed for pain.     metFORMIN 500 MG tablet  Commonly known as:  GLUCOPHAGE  Take 500 mg by mouth 2 (two) times daily with a meal.     multivitamin tablet  Take 1 tablet by mouth daily.     rosuvastatin 20 MG tablet  Commonly known as:  CRESTOR  Take 20 mg by mouth daily.     tiotropium 18 MCG inhalation capsule  Commonly known as:  SPIRIVA  Place 18 mcg into inhaler and inhale  daily as needed (only when she feels short of breath).        Disposition   The patient will be discharged in stable condition to home. Discharge Orders   Future Appointments Provider Department Dept Phone   12/04/2012 2:15 PM Hillis Range, MD [place] [facility] [place]) [phone]   Future Orders Complete By Expires   Diet - low sodium heart healthy  As directed    Increase activity slowly  As directed    Comments:     If you develop any abnormal bleeding, contact your doctor immediately.     Follow-up Information   Follow up with Neldon Labella, MD. (Follow up with primary care doctor to discuss rechecking your thyroid function - your TSH was mildly elevated at 4.508. Follow up with primary care for your numbness and tingling. Return to ER if symptoms worsen.)    Specialty:  Family Medicine   Contact information:   [address] [place] [address] [phone]       Follow up with Hillis Range, MD. (Our office will call you for a follow-up appointment. Please call the office if you have not heard from [place] within 5 days.)    Specialty:  Cardiology   Contact information:   [address] [place] [address] [phone]         Duration of Discharge Encounter: Greater than 30 minutes including physician and PA time.  Signed, Kriste Basque Dunn PA-C 11/11/2012, 2:35 PM    Hillis Range MD

## 2012-11-11 NOTE — Telephone Encounter (Signed)
optumrx approval for eliquis 5 mg tablets approved through 11/11/2013 patient ID # [id number], [id number]

## 2012-11-11 NOTE — Progress Notes (Signed)
SUBJECTIVE: The patient is doing well today.  At this time, she denies chest pain, shortness of breath, or any new concerns.  She has converted back to SR. She states that her atrial fibrillation has been well controlled on Multaq at home.  Most episodes are very short and self terminating.  They are not frequent.   Labs this admission notable for TSH of 4.508 - normal in 2011. Last echo in January of this year - EF 55-60%, LA 43.    Pt with some arm tingling prior to admission - CT scan yesterday demonstrated no acute abnormalities.  Neuro exam pending.  CURRENT MEDICATIONS: . apixaban  5 mg Oral BID  . diltiazem  30 mg Oral Q6H  . dronedarone  400 mg Oral Daily  . glimepiride  4 mg Oral QAC breakfast  . hydrochlorothiazide  25 mg Oral Daily  . insulin aspart  0-15 Units Subcutaneous TID WC  . multivitamin with minerals  1 tablet Oral Daily  . rosuvastatin  20 mg Oral q1800      OBJECTIVE: Physical Exam: Filed Vitals:   11/10/12 1300 11/10/12 1340 11/10/12 1500 11/10/12 2106  BP:  106/71 121/72 141/74  Pulse:  65 91 89  Temp:  99 F (37.2 C) 98.6 F (37 C) 98.4 F (36.9 C)  TempSrc:  Oral Oral Oral  Resp:  16 17 17   Height: 5\' 6"  (1.676 m)     Weight: 206 lb (93.441 kg)     SpO2:   100% 98%   No intake or output data in the 24 hours ending 11/11/12 0816  Telemetry reveals sinus rhythm; converted from afib with 2.9 second post termination pause (asymptomatic)  GEN- The patient is well appearing, alert and oriented x 3 today.   Head- normocephalic, atraumatic Eyes-  Sclera clear, conjunctiva pink Ears- hearing intact Oropharynx- clear Neck- supple, no JVP Lymph- no cervical lymphadenopathy Lungs- Clear to ausculation bilaterally, normal work of breathing Heart- Regular rate and rhythm, no murmurs, rubs or gallops, PMI not laterally displaced GI- soft, NT, ND, + BS Extremities- no clubbing, cyanosis, or edema Skin- no rash or lesion Psych- euthymic mood, full  affect Neuro- strength and sensation are intact  LABS: Basic Metabolic Panel:  Recent Labs  47/82/95 0539 11/11/12 0605  NA 140 138  K 3.7 3.7  CL 103 101  CO2 25 27  GLUCOSE 248* 188*  BUN 11 10  CREATININE 0.59 0.71  CALCIUM 9.9 8.8  MG 2.2  --    Liver Function Tests:  Recent Labs  11/11/12 0605  AST 13  ALT 15  ALKPHOS 81  BILITOT 0.4  PROT 5.8*  ALBUMIN 3.1*   CBC:  Recent Labs  11/10/12 0539 11/11/12 0605  WBC 9.6 8.0  HGB 16.0* 14.0  HCT 44.5 39.4  MCV 89.7 90.8  PLT 199 177   Cardiac Enzymes:  Recent Labs  11/10/12 1530 11/10/12 1840 11/11/12 0105  TROPONINI <0.30 <0.30 <0.30   Fasting Lipid Panel:  Recent Labs  11/11/12 0605  CHOL 115  HDL 44  LDLCALC 49  TRIG 112  CHOLHDL 2.6   Thyroid Function Tests:  Recent Labs  11/10/12 1430  TSH 4.508*    RADIOLOGY: Ct Head Wo Contrast 11/10/2012   CLINICAL DATA:  62 year old female with right upper extremity tingling, right lower extremity weakness. Atrial fibrillation. Stroke versus TIA.  EXAM: CT HEAD WITHOUT CONTRAST  TECHNIQUE: Contiguous axial images were obtained from the base of the skull through the  vertex without intravenous contrast.  COMPARISON:  Brain MRI 03/20/2009.  FINDINGS: Visualized paranasal sinuses and mastoids are clear. No acute osseous abnormality identified. Leftward gaze deviation. No acute orbit or scalp soft tissue findings.  Calcified atherosclerosis at the skull base. Asymmetric hypodensity in the left temporal lobe, but may be artifact. Elsewhere in the left hemisphere no changes of acute cortically based infarct identified. No midline shift, mass effect, or evidence of intracranial mass lesion. No acute intracranial hemorrhage identified. No ventriculomegaly. No suspicious intracranial vascular hyperdensity.  IMPRESSION: No acute intracranial hemorrhage or mass effect. Asymmetric hypodensity in the left temporal lobe, favor artifact. There is leftward gaze  deviation, but no definite acute cortically based infarct identified in the low left hemisphere.   Electronically Signed   By: Augusto Gamble M.D.   On: 11/10/2012 12:19   Dg Chest Port 1 View 11/10/2012   CLINICAL DATA:  Acute onset chest pain this morning.  EXAM: PORTABLE CHEST - 1 VIEW  COMPARISON:  PA and lateral chest 06/04/2010.  FINDINGS: Heart size is upper normal with vascular congestion. No consolidative process, pneumothorax or effusion. No focal bony abnormality.  IMPRESSION: No acute disease.   Electronically Signed   By: Drusilla Kanner M.D.   On: 11/10/2012 06:01   Assessment/Plan:  1.  Atrial fibrillation - now back in sinus.  She was taking multaq only once daily.  I have encouraged her to take multaq twice daily.  Will use cardizem 30mg  q6 hours prn for breakthrough afib with RVR.  I agree with eliquis for stroke prevention.  2.  Hypertension - stable No changes  3.  Tobacco abuse - cessation advised, she is not ready to quit  4.  Right arm tingling and right leg weakness -  She has had chronic left arm tingling and numbness (3rd/4th/5th digits) for "years".  She states that Dr Hyacinth Meeker has worked this up previously.  She declines inpatient neurology evaluation of R arm tingling/ leg weakness.  Neuro exam is unremarkable and symptoms appear atypical.  I have encouarge her to follow up with Dr Hyacinth Meeker and to return if they worsen.  5. Elevated TSH followup with Dr Hyacinth Meeker   DC to home today  Follow-up with me in 4 weeks

## 2012-11-11 NOTE — Progress Notes (Signed)
Pts assessment unchanged from this morning. D/c'd via wheelchair to private vehicle

## 2012-11-11 NOTE — Care Management (Addendum)
Prior Berkley Harvey is needed for eliquis if this is the medication of choice- MD please call  [phone]. Thanks Gala Lewandowsky, RN,BSN [phone]

## 2012-11-11 NOTE — Progress Notes (Signed)
Inpatient Diabetes Program Recommendations  AACE/ADA: New Consensus Statement on Inpatient Glycemic Control (2013)  Target Ranges:  Prepandial:   less than 140 mg/dL      Peak postprandial:   less than 180 mg/dL (1-2 hours)      Critically ill patients:  140 - 180 mg/dL     Results for Melanie Lucas, Melanie Lucas (MRN [id number]) as of 11/11/2012 13:20  Ref. Range 11/11/2012 07:51 11/11/2012 11:57  Glucose-Capillary Latest Range: 70-99 mg/dL 562 (H) 130 (H)    **Noted fasting glucose elevated today.  Also elevated at lunch today.  MD- Please consider the following in-hospital recommendations:  1. Add basal insulin- Levemir 10-15 units QHS (~0.2 units/kg) 2. Please check current A1c- Last A1c on file was from 2011   Will follow. Ambrose Finland RN, MSN, CDE Diabetes Coordinator Inpatient Diabetes Program Team Pager: [phone] (8a-10p)

## 2012-11-18 MED ORDER — GABAPENTIN 300 MG PO CAPS
300 MG | ORAL_CAPSULE | Freq: Two times a day (BID) | ORAL | Status: AC
Start: 2012-11-18 — End: ?

## 2012-11-18 NOTE — Telephone Encounter (Signed)
From: Destiny Evans  To: Teressa Senter, MD  Sent: 11/18/2012 11:15 AM EDT  Subject: Medication Renewal Request    Original authorizing provider: Teressa Senter, MD    Destiny Evans would like a refill of the following medications:  gabapentin (NEURONTIN) 300 MG capsule Teressa Senter, MD]    Preferred pharmacy: [facility] [address] - [place], [place] - [address] - P [phone] - F [phone]    Comment:

## 2012-11-25 ENCOUNTER — Telehealth: Payer: Self-pay | Admitting: Internal Medicine

## 2012-11-25 NOTE — Telephone Encounter (Signed)
New Problem  Pt states she had to stop usage of new medication because she is bleeding when she uses the bathroom. When she stopped using the medication she stopped bleeding.

## 2012-11-25 NOTE — Telephone Encounter (Signed)
Pt states was in the hospital last week , while  in the hospital she was prescribed Eliquis 5 mg twice a day. This past week, She was seen by the kidney MD and found  That she had blood in her urine. Pt has been having bloody urine, so she stop taking this medication since Sunday, and  she is not bleeding since then. Pt is aware that I will send this message to Dr. Johney Frame for reviewing

## 2012-12-01 NOTE — Telephone Encounter (Signed)
Please arrange follow-up with one of our NPs/PAs for further discussion

## 2012-12-02 ENCOUNTER — Other Ambulatory Visit: Payer: Self-pay | Admitting: Urology

## 2012-12-03 ENCOUNTER — Encounter (HOSPITAL_COMMUNITY): Payer: Self-pay | Admitting: *Deleted

## 2012-12-03 ENCOUNTER — Ambulatory Visit: Payer: Medicare Other | Admitting: Internal Medicine

## 2012-12-03 NOTE — Progress Notes (Signed)
EkG done 11/10/12 at [facility], called to [facility] to get the results confirmed

## 2012-12-03 NOTE — Progress Notes (Signed)
Notifed [facility] of pts admission in sept to [facility] for AFib, taking Multaq now. Pt denies chest pain or palpitations. Ekg prior to d/c on 9/23-no afib. Spoke with Verda Cumins RN-no further ekg needed if pt is symptomatic.

## 2012-12-04 ENCOUNTER — Ambulatory Visit: Payer: Medicare Other | Admitting: Internal Medicine

## 2012-12-04 ENCOUNTER — Encounter (HOSPITAL_COMMUNITY)
Admission: RE | Disposition: A | Discharge status: Home / Self Care | Payer: Self-pay | Source: Ambulatory Visit | Attending: Urology

## 2012-12-04 ENCOUNTER — Encounter (HOSPITAL_COMMUNITY): Payer: Self-pay | Admitting: *Deleted

## 2012-12-04 ENCOUNTER — Ambulatory Visit (HOSPITAL_COMMUNITY)
Admission: RE | Admit: 2012-12-04 | Discharge: 2012-12-04 | Disposition: A | Discharge status: Home / Self Care | Payer: Medicare Other | Source: Ambulatory Visit | Attending: Urology | Admitting: Urology

## 2012-12-04 ENCOUNTER — Ambulatory Visit (HOSPITAL_COMMUNITY): Payer: Medicare Other

## 2012-12-04 DIAGNOSIS — I10 Essential (primary) hypertension: Secondary | ICD-10-CM | POA: Insufficient documentation

## 2012-12-04 DIAGNOSIS — E119 Type 2 diabetes mellitus without complications: Secondary | ICD-10-CM | POA: Insufficient documentation

## 2012-12-04 DIAGNOSIS — N200 Calculus of kidney: Secondary | ICD-10-CM | POA: Insufficient documentation

## 2012-12-04 DIAGNOSIS — Z79899 Other long term (current) drug therapy: Secondary | ICD-10-CM | POA: Insufficient documentation

## 2012-12-04 DIAGNOSIS — E7800 Pure hypercholesterolemia, unspecified: Secondary | ICD-10-CM | POA: Insufficient documentation

## 2012-12-04 DIAGNOSIS — J438 Other emphysema: Secondary | ICD-10-CM | POA: Insufficient documentation

## 2012-12-04 DIAGNOSIS — N281 Cyst of kidney, acquired: Secondary | ICD-10-CM | POA: Insufficient documentation

## 2012-12-04 DIAGNOSIS — F17200 Nicotine dependence, unspecified, uncomplicated: Secondary | ICD-10-CM | POA: Insufficient documentation

## 2012-12-04 DIAGNOSIS — R310 Gross hematuria: Secondary | ICD-10-CM | POA: Insufficient documentation

## 2012-12-04 DIAGNOSIS — I4891 Unspecified atrial fibrillation: Secondary | ICD-10-CM | POA: Insufficient documentation

## 2012-12-04 DIAGNOSIS — Z8673 Personal history of transient ischemic attack (TIA), and cerebral infarction without residual deficits: Secondary | ICD-10-CM | POA: Insufficient documentation

## 2012-12-04 HISTORY — DX: Cerebral infarction, unspecified: I63.9

## 2012-12-04 LAB — GLUCOSE, CAPILLARY: Glucose-Capillary: 312 mg/dL — ABNORMAL HIGH (ref 70–99)

## 2012-12-04 SURGERY — LITHOTRIPSY, ESWL
Anesthesia: LOCAL | Laterality: Left

## 2012-12-04 MED ORDER — DIAZEPAM 5 MG PO TABS
10.0000 mg | ORAL_TABLET | ORAL | Status: AC
  Administered 2012-12-04: 10 mg via ORAL
  Filled 2012-12-04: qty 2

## 2012-12-04 MED ORDER — CIPROFLOXACIN HCL 500 MG PO TABS
500.0000 mg | ORAL_TABLET | ORAL | Status: AC
  Administered 2012-12-04: 500 mg via ORAL
  Filled 2012-12-04: qty 1

## 2012-12-04 MED ORDER — DIPHENHYDRAMINE HCL 25 MG PO CAPS
25.0000 mg | ORAL_CAPSULE | ORAL | Status: AC
  Administered 2012-12-04: 25 mg via ORAL
  Filled 2012-12-04: qty 1

## 2012-12-04 MED ORDER — DEXTROSE-NACL 5-0.45 % IV SOLN
INTRAVENOUS | Status: DC
  Administered 2012-12-04: 12:00:00 via INTRAVENOUS

## 2012-12-04 NOTE — H&P (Signed)
History of Present Illness  Melanie Lucas returns for follow-up evaluation of gross hematuria.  She stopped taking Eliquis over a week ago.  She has not seen any more blood in her urine.  CT scan showed several left renal calculi, the largest one measures 1cm and is in the renal pelvis.  There is no hydronephrosis.  She also has a right upper pole renal cyst. There are stable large bilateral adrenal adenomas.  She has mild suprapubic discomfort.  Urinalysis shows 11-20 RBC's.  She has been a smoker for about 50 years. She needs cystoscopy to evaluate the bladder.   Past Medical History Problems  1. History of  Atrial Fibrillation 427.31 2. History of  Diabetes Mellitus 250.00 3. History of  Hypercholesterolemia 272.0 4. History of  Hypertension 401.9 5. History of  Skin Cancer V10.83  Surgical History Problems  1. History of  No Surgical Problems  Current Meds 1. Betamethasone Dipropionate Aug 0.05 % External Ointment; Therapy: 15May2013 to 2. Crestor 5 MG Oral Tablet; Therapy: 17Apr2013 to 3. Diazepam 2 MG Oral Tablet; Therapy: 02Jul2013 to 4. Eliquis 5 MG Oral Tablet; Therapy: 23Sep2014 to 5. Glimepiride 4 MG Oral Tablet; Therapy: 17Apr2013 to 6. Hydrochlorothiazide 25 MG Oral Tablet; Therapy: 17Apr2013 to 7. MetFORMIN HCl 1000 MG Oral Tablet; Therapy: (Recorded:23Aug2012) to 8. Multaq 400 MG Oral Tablet; Therapy: 28Jun2013 to 9. VESIcare 5 MG Oral Tablet; Therapy: (Recorded:23Aug2012) to  Allergies Medication  1. Sulfa Drugs  Family History Problems  1. Maternal history of  Bladder Cancer V16.52 2. Family history of  Family Health Status Number Of Children 2 daughters 3. Family history of  Father Deceased At Age 38 heart disease 4. Fraternal history of  Leukemia V16.6 5. Family history of  Mother Deceased At Age 51 bladder cancer 6. Paternal history of  Renal Failure 7. Family history of  Renal Failure  Social History Problems  1. Caffeine Use 2 per day 2. Marital  History - Currently Married 3. Occupation: Retired 4. Smoking Cigarettes 305.1 1 ppd x 32yrs Denied  5. History of  Alcohol Use  Review of Systems Genitourinary, constitutional, skin, eye, otolaryngeal, hematologic/lymphatic, cardiovascular, pulmonary, endocrine, musculoskeletal, gastrointestinal, neurological and psychiatric system(s) were reviewed and pertinent findings if present are noted.    Physical Exam Constitutional: Well nourished and well developed . No acute distress.  ENT:. The ears and nose are normal in appearance.  Neck: The appearance of the neck is normal and no neck mass is present.  Pulmonary: No respiratory distress and normal respiratory rhythm and effort.  Cardiovascular: Heart rate and rhythm are normal . No peripheral edema.  Abdomen: The abdomen is soft and nontender. No masses are palpated. No CVA tenderness. No hernias are palpable. No hepatosplenomegaly noted.  Genitourinary:  Chaperone Present: .  Examination of the external genitalia shows normal female external genitalia and no lesions. The urethra is normal in appearance and not tender. There is no urethral mass. Vaginal exam demonstrates no abnormalities. The adnexa are palpably normal. The bladder is non tender and not distended. The anus is normal on inspection. The perineum is normal on inspection.  Lymphatics: The femoral and inguinal nodes are not enlarged or tender.  Skin: Normal skin turgor, no visible rash and no visible skin lesions.  Neuro/Psych:. Mood and affect are appropriate.    Results/Data Urine [Data Includes: Last 1 Day]   14Oct2014  COLOR YELLOW   APPEARANCE CLEAR   SPECIFIC GRAVITY 1.025   pH 6.0   GLUCOSE > 1000  mg/dL  BILIRUBIN NEG   KETONE NEG mg/dL  BLOOD LARGE   PROTEIN TRACE mg/dL  UROBILINOGEN 0.2 mg/dL  NITRITE NEG   LEUKOCYTE ESTERASE NEG   SQUAMOUS EPITHELIAL/HPF RARE   WBC NONE SEEN WBC/hpf  RBC 11-20 RBC/hpf  BACTERIA NONE SEEN   CRYSTALS NONE SEEN   CASTS  NONE SEEN    Procedure  Procedure: Cystoscopy  Chaperone Present: Brandy May.  Indication: Hematuria.  Informed Consent: Risks, benefits, and potential adverse events were discussed and informed consent was obtained from the patient . Specific risks including, but not limited to bleeding, infection, pain, allergic reaction etc. were explained.  Prep: The patient was prepped with betadine.  Anesthesia:. Local anesthesia was administered intraurethrally with 2% lidocaine jelly.  Procedure Note:  Urethral meatus:. No abnormalities.  Anterior urethra: No abnormalities.  Bladder: Visulization was clear. The ureteral orifices were in the normal anatomic position bilaterally. A systematic survey of the bladder demonstrated no bladder tumors or stones. The mucosa was smooth without abnormalities. The patient tolerated the procedure well.  Complications: None.    Assessment Assessed  1. Gross Hematuria 599.71 2. Nephrolithiasis Of The Left Kidney 592.0  Plan Gross Hematuria (599.71)  1. URINE CYTOLOGY  Requested for: 14Oct2014 Health Maintenance (V70.0)  2. UA With REFLEX  Done: 14Oct2014 11:22AM   I discussed the treatment options of the left renal pelvis calculus: ESL versus ureteroscopy.  I explained to her that ESL is the least invasive of the procedures.  Since she is off anticoagulants she would prefer to have ESL and proceed at this time.  The risks of ESL were discussed with the patient.  They include but are not limited to hemorrhage, renal or perirenal hematoma, steinstrasse, injury to adjacent organs.  She understands that if the stone is not radioapaque we will not be able to do ESL.  She is agreeable.

## 2012-12-04 NOTE — Progress Notes (Signed)
Paged Dr Brunilda Payor and notified Lithotripsy unit that patient had black coffee at 0900. 1230 procedure will be delayed until 1300.

## 2012-12-04 NOTE — Op Note (Signed)
Refer to [facility] Op Note scanned in the chart 

## 2012-12-05 LAB — GLUCOSE, CAPILLARY: Glucose-Capillary: 291 mg/dL — ABNORMAL HIGH (ref 70–99)

## 2012-12-05 NOTE — Telephone Encounter (Signed)
10/13 [name] left patient a message to return her call to set up an appointment

## 2012-12-10 ENCOUNTER — Encounter: Payer: Self-pay | Admitting: Internal Medicine

## 2012-12-10 ENCOUNTER — Ambulatory Visit (INDEPENDENT_AMBULATORY_CARE_PROVIDER_SITE_OTHER): Payer: Medicare Other | Admitting: Internal Medicine

## 2012-12-10 VITALS — BP 130/78 | HR 61 | Ht 66.0 in | Wt 206.8 lb

## 2012-12-10 DIAGNOSIS — F17200 Nicotine dependence, unspecified, uncomplicated: Secondary | ICD-10-CM

## 2012-12-10 DIAGNOSIS — I4891 Unspecified atrial fibrillation: Secondary | ICD-10-CM

## 2012-12-10 NOTE — Patient Instructions (Signed)
Your physician recommends that you schedule a follow-up appointment in: 6 weeks with Norma Fredrickson, NP.  Your physician recommends that you schedule a follow-up appointment in: 3 months with Dr. Johney Frame.

## 2012-12-10 NOTE — Progress Notes (Signed)
PCP: Sigmund Hazel, MD  The patient presents today for routine electrophysiology followup.   She was recently hospitalized with afib.  She was taking multaq only once per day at that time.  Since restarting twice daily multaq she has done much better.  She has stable but chronic BLE edema.  She continues to smoke and is not ready to quit.  She denies exertional CP, SOB, dizziness, presyncope, syncope, or neurologic sequela.   The patient feels that she is tolerating medications without difficulties and is otherwise without complaint today.   Past Medical History  Diagnosis Date  . Diabetes mellitus   . Hypertension   . PAF with post-termination pauses     a. on dronedarone;  b. CHA2DS2VASc = 5 (HTN, DM, h/o lacunar infarct on MRI, Female) ->refused oral anticoagulation after h/o hematuria on pradaxa;  c. 02/2012 Echo: EF 55-60%, mildly dil LA. D. Recurrent PAF 10/2012 after only taking Multaq 1x/day - spont conv to NSR, placed back on BID Multaq/eliquis.  [name] COPD (chronic obstructive pulmonary disease)   . Obesity   . Chronic acquired lymphedema     a. R>L;  b. 03/2012 Neg LE U/S for DVT.  [name] Osteoarthritis   . Meniere disease   . CVD (cardiovascular disease)   . Hematuria     a. while on pradaxa,  she reports that she has seen Dr Elba Barman and had low risk cystoscopy  . Tobacco abuse   . Lacunar infarction     a. 02/2009 non-acute Lacunar infarct of the right thalamus noted on MRI of brain.  . Elevated TSH     a. 10/2012 - inst to f/u PCP.  . Numbness and tingling   . Stroke     hx of "mini strokes" 58yrs ago per pt   Past Surgical History  Procedure Laterality Date  . Breast biopsy    . Biopsy of adrenal glands      Current Outpatient Prescriptions  Medication Sig Dispense Refill  . diazepam (VALIUM) 2 MG tablet Take 1 tablet by mouth as needed.      . diltiazem (CARDIZEM) 30 MG tablet Take 1 tablet by mouth as needed.      . dronedarone (MULTAQ) 400 MG tablet Take 1 tablet (400 mg total) by  mouth 2 (two) times daily with a meal.      . glimepiride (AMARYL) 4 MG tablet Take 4 mg by mouth daily before breakfast.        . hydrochlorothiazide (HYDRODIURIL) 25 MG tablet Take 25 mg by mouth daily.       [name] HYDROcodone-acetaminophen (NORCO/VICODIN) 5-325 MG per tablet Take 1 tablet by mouth as needed.      [name] ibuprofen (ADVIL,MOTRIN) 200 MG tablet Take 600 mg by mouth every 6 (six) hours as needed for pain.       . metFORMIN (GLUCOPHAGE) 500 MG tablet Take 1,000 mg by mouth 2 (two) times daily with a meal.       . Multiple Vitamin (MULTIVITAMIN) tablet Take 1 tablet by mouth daily.      . rosuvastatin (CRESTOR) 5 MG tablet Take 5 mg by mouth daily.      [name] tiotropium (SPIRIVA) 18 MCG inhalation capsule Place 18 mcg into inhaler and inhale daily as needed (only when she feels short of breath).        No current facility-administered medications for this visit.    Allergies  Allergen Reactions  . Statins Other (See Comments)    Muscle pain  .  Sulfonamide Derivatives Hives    Nausea vertigo    History   Social History  . Marital Status: Married    Spouse Name: N/A    Number of Children: N/A  . Years of Education: N/A   Occupational History  . Not on file.   Social History Main Topics  . Smoking status: Current Every Day Smoker -- 1.00 packs/day for 46 years    Types: Cigarettes  . Smokeless tobacco: Never Used     Comment: not ready to quit  . Alcohol Use: No  . Drug Use: No  . Sexual Activity: No   Other Topics Concern  . Not on file   Social History Narrative   She operates an entertainment business    Family History  Problem Relation Age of Onset  . Heart attack Father   . Coronary artery disease Father     strong family hx  . Leukemia Brother   . Cancer Mother     bladder    Physical Exam: Filed Vitals:   12/10/12 1417  BP: 130/78  Pulse: 61  Height: 5\' 6"  (1.676 m)  Weight: 206 lb 12.8 oz (93.804 kg)    GEN- The patient is well appearing, alert  and oriented x 3 today.   Head- normocephalic, atraumatic Eyes-  Sclera clear, conjunctiva pink Ears- hearing intact Oropharynx- clear Neck- supple, no JVP Lymph- no cervical lymphadenopathy Lungs- Clear to ausculation bilaterally, normal work of breathing Heart- Regular rate and rhythm, no murmurs, rubs or gallops, PMI not laterally displaced GI- soft, NT, ND, + BS Extremities- no clubbing, cyanosis, trace edema MS- no significant deformity or atrophy Skin- no rash or lesion Psych- euthymic mood, full affect Neuro- strength and sensation are intact  ekg today reveals sinus rhythm 61 bpm, nonspecific ST/T changes  Assessment and Plan:  1. afib Controlled presently Due to recent hematuria, she has stopped her anticoagulation.  She is reluctant to reconsider anticoagulation at this time She may be more willing to consider once her hematuria has resolved.  2. Abnormal TFTs in the hospital I have encouraged her to follow-up with Dr Hyacinth Meeker  3. Tobacco Cessation encouraged She is not ready quit  Return to see Lawson Fiscal in 6 weeks I will see in 3 months

## 2012-12-18 MED ORDER — PREGABALIN 75 MG PO CAPS
75 MG | ORAL_CAPSULE | Freq: Two times a day (BID) | ORAL | Status: AC
Start: 2012-12-18 — End: ?

## 2012-12-18 NOTE — Patient Instructions (Signed)
Gabapentin take  3 daily for first week, then 2 daily for second week then one daily for third week then stop.  During the second week, start the Lyrica twice a day.

## 2012-12-18 NOTE — Progress Notes (Signed)
[facility]  Destiny Hacker MD   Progress Note      SUBJECTIVE: Once again the patient has increasing generalized pain associated with headaches and marked fatigue.  She is currently on Cymbalta and gabapentin.    Review of systems:  Constitutional: Denies weight gain or loss, fever, chills or sweats.  ENT: No hearing loss tinnitus or recent infection.  Chest: No shortness of breath, cough, wheezing or sputum production.  Cor: Denies chest pain or anginal symptoms. No heart murmur. No history of hypertension.  Abdomen: Denies nausea, vomiting, diarrhea or bleeding.  Extremities: History of osteoarthritis  Skin: No rash.    Allergies:  Advair diskus    OBJECTIVE    Filed Vitals:    12/18/12 1347   BP: 120/84   Pulse: 88     Constitutional:  Well-developed individual in no acute distress.   Neck Supple with full ROM. No thyromegaly.  Chest: Symmetrical expansion.   Clear to percussion and auscultation  Cor: Apical impulse 5th ICS LMCL          S1 and S2 are normal. There are no murrurs, rubs or gallops           There are no carotid bruits           There is no edema  Abdomen: Soft and nontender. BS are active                    There is no organomegaly.  Extremities:     JOINT COUNT:   RIGHT  Swell  Tender  ROM  LEFT  Swell  Tender  ROM    DIP2  0  0  FULL   0  0  FULL    DIP3  0  0  FULL   0  0  FULL    DIP4  0  0  FULL   0  0  FULL    DIP5  0  0  FULL   0  0  FULL    PIP1  0  0  FULL   0  0  FULL    PIP2  0  0  FULL   0  0  FULL    PIP3  0  0  FULL   0  0  FULL    PIP4  0  0  FULL   0  0  FULL    PIP5  0  0  FULL   0  0  FULL    MCP1  0  0  FULL   0  0  FULL    MCP2  0  0  FULL   0  0  FULL    MCP3  0  0  FULL   0  0  FULL    MCP4  0  0  FULL   0  0  FULL    MCP5  0  0  FULL   0  0  FULL    Wrist  0  0  FULL   0  0  FULL    Elbow  0  0  FULL   0  0  FULL    Shouldr  0  0  FULL   0  0  FULL    Hip  0  0  FULL   0  0  FULL    Knee  0  +  Crepitus   0  +  FULL    Ankle  0  0  FULL   0  0  FULL    MTP1  0  0  FULL   0  0   FULL    MTP2  0  +  FULL   0  +  FULL    MTP3  0  +  FULL   0  +  FULL    MTP4  0  +  FULL   0  +  FULL    MTP5  0  0  FULL   0  0  FULL    IP1  0  0  FULL   0  0  FULL    IP2  0  0  FULL   0  0  FULL    IP3  0  0  FULL   0  0  FULL    IP4  0  0  FULL   0  0  FULL    IP5  0  0  FULL   0  0  FULL    Tender Points:   Tender points  Right  Left     OCCIPUT  +  +     Supraspinatus  +  +     Low Cervical  +  +     Trapezius  +  +     Gluteal  +  +     Second rib  +  +     Lateral epicondyle  +  +     Greater Trochanter  +  +     Knees  +  +             ASSESSMENT  Fibromyalgia  Pain management       PLAN    Fibromyalgia: Still very symptomatic.  Discussed treatment options and will switch her gabapentin to Lyrica to see if that works better.  I will titrate the gabapentin down and then start the Lyrica.  Pain management: No change in therapy.  Followup in 6 months

## 2013-01-20 ENCOUNTER — Ambulatory Visit: Payer: Medicare Other | Admitting: Nurse Practitioner

## 2013-01-21 ENCOUNTER — Ambulatory Visit: Payer: Medicare Other | Admitting: Internal Medicine

## 2013-02-26 ENCOUNTER — Telehealth: Payer: Self-pay | Admitting: Cardiology

## 2013-02-26 NOTE — Telephone Encounter (Signed)
Courtesy call requested by the patient.  Dr. Carlis Stable, cardiologist in [place], [place] calling in regards to Melanie Lucas who while visiting in [place] appears to have suffered a myocardial infarction. Presented with chest pain, EKG changes and elevated troponin. Transferred urgently to a PCI center in [place] and is about to undergo cardiac cath. Pt requested physician to contact Dr. Rayann Heman. Quickly reviewed pt's cardiac history with Dr. Carlis Stable from most recent note. Indicated that a bare metal stent may be best if possible due to difficulties with anti-coagulation in the past but certainly defer decision to interventionalist. Thanked Dr. Carlis Stable for the courtesy call. Return phone number is [phone]. FYI to Dr. Rayann Heman.

## 2013-03-11 ENCOUNTER — Ambulatory Visit (INDEPENDENT_AMBULATORY_CARE_PROVIDER_SITE_OTHER): Payer: Medicare Other | Admitting: Physician Assistant

## 2013-03-11 ENCOUNTER — Encounter: Payer: Self-pay | Admitting: Physician Assistant

## 2013-03-11 VITALS — BP 152/79 | HR 62 | Ht 66.0 in | Wt 200.0 lb

## 2013-03-11 DIAGNOSIS — I10 Essential (primary) hypertension: Secondary | ICD-10-CM

## 2013-03-11 DIAGNOSIS — F17200 Nicotine dependence, unspecified, uncomplicated: Secondary | ICD-10-CM

## 2013-03-11 DIAGNOSIS — R609 Edema, unspecified: Secondary | ICD-10-CM

## 2013-03-11 DIAGNOSIS — E663 Overweight: Secondary | ICD-10-CM

## 2013-03-11 DIAGNOSIS — I2510 Atherosclerotic heart disease of native coronary artery without angina pectoris: Secondary | ICD-10-CM | POA: Insufficient documentation

## 2013-03-11 DIAGNOSIS — I4891 Unspecified atrial fibrillation: Secondary | ICD-10-CM

## 2013-03-11 DIAGNOSIS — I25810 Atherosclerosis of coronary artery bypass graft(s) without angina pectoris: Secondary | ICD-10-CM

## 2013-03-11 MED ORDER — CLOPIDOGREL BISULFATE 75 MG PO TABS
75.0000 mg | ORAL_TABLET | Freq: Every day | ORAL | Status: DC
Start: 2013-03-11 — End: 2013-05-13

## 2013-03-11 NOTE — Assessment & Plan Note (Signed)
Exercise and weight loss recommended. 

## 2013-03-11 NOTE — Assessment & Plan Note (Signed)
Patient was traveling to [place] in suffered an MI and was transported to a [facility] where she underwent bare-metal stent to the first diagonal. She was not started on Plavix but placed on aspirin. I discussed this patient with Dr. Rayann Heman who recommends Plavix for one month. Smoking cessation is recommended an exercise program. Dr. Rayann Heman recommend she followup with Dr. Meda Coffee to become established for her CAD.

## 2013-03-11 NOTE — Assessment & Plan Note (Signed)
Patient was smoking a pack daily and has cut back to 3 cigarettes daily. Smoking cessation discussed.

## 2013-03-11 NOTE — Assessment & Plan Note (Signed)
No evidence of atrial fibrillation.

## 2013-03-11 NOTE — Assessment & Plan Note (Signed)
Chronic edema. Recommend TED hose, elevation and 2 g sodium diet

## 2013-03-11 NOTE — Patient Instructions (Addendum)
Your physician has recommended you make the following change in your medication:   1. Start Plavix 75mg  once daily with food.  Your physician recommends that you schedule a follow-up appointment in: 2 months with Dr. Meda Coffee and with Dr. Rayann Heman as planned.  Quit Smoking  Start 2 gram Sodium diet  2 Gram Low Sodium Diet A 2 gram sodium diet restricts the amount of sodium in the diet to no more than 2 g or 2000 mg daily. Limiting the amount of sodium is often used to help lower blood pressure. It is important if you have heart, liver, or kidney problems. Many foods contain sodium for flavor and sometimes as a preservative. When the amount of sodium in a diet needs to be low, it is important to know what to look for when choosing foods and drinks. The following includes some information and guidelines to help make it easier for you to adapt to a low sodium diet. QUICK TIPS  Do not add salt to food.  Avoid convenience items and fast food.  Choose unsalted snack foods.  Buy lower sodium products, often labeled as "lower sodium" or "no salt added."  Check food labels to learn how much sodium is in 1 serving.  When eating at a restaurant, ask that your food be prepared with less salt or none, if possible. READING FOOD LABELS FOR SODIUM INFORMATION The nutrition facts label is a good place to find how much sodium is in foods. Look for products with no more than 500 to 600 mg of sodium per meal and no more than 150 mg per serving. Remember that 2 g = 2000 mg. The food label may also list foods as:  Sodium-free: Less than 5 mg in a serving.  Very low sodium: 35 mg or less in a serving.  Low-sodium: 140 mg or less in a serving.  Light in sodium: 50% less sodium in a serving. For example, if a food that usually has 300 mg of sodium is changed to become light in sodium, it will have 150 mg of sodium.  Reduced sodium: 25% less sodium in a serving. For example, if a food that usually has 400 mg  of sodium is changed to reduced sodium, it will have 300 mg of sodium. CHOOSING FOODS Grains  Avoid: Salted crackers and snack items. Some cereals, including instant hot cereals. Bread stuffing and biscuit mixes. Seasoned rice or pasta mixes.  Choose: Unsalted snack items. Low-sodium cereals, oats, puffed wheat and rice, shredded wheat. English muffins and bread. Pasta. Meats  Avoid: Salted, canned, smoked, spiced, pickled meats, including fish and poultry. Bacon, ham, sausage, cold cuts, hot dogs, anchovies.  Choose: Low-sodium canned tuna and salmon. Fresh or frozen meat, poultry, and fish. Dairy  Avoid: Processed cheese and spreads. Cottage cheese. Buttermilk and condensed milk. Regular cheese.  Choose: Milk. Low-sodium cottage cheese. Yogurt. Sour cream. Low-sodium cheese. Fruits and Vegetables  Avoid: Regular canned vegetables. Regular canned tomato sauce and paste. Frozen vegetables in sauces. Olives. Angie Fava. Relishes. Sauerkraut.  Choose: Low-sodium canned vegetables. Low-sodium tomato sauce and paste. Frozen or fresh vegetables. Fresh and frozen fruit. Condiments  Avoid: Canned and packaged gravies. Worcestershire sauce. Tartar sauce. Barbecue sauce. Soy sauce. Steak sauce. Ketchup. Onion, garlic, and table salt. Meat flavorings and tenderizers.  Choose: Fresh and dried herbs and spices. Low-sodium varieties of mustard and ketchup. Lemon juice. Tabasco sauce. Horseradish. SAMPLE 2 GRAM SODIUM MEAL PLAN Breakfast / Sodium (mg)  1 cup low-fat milk / 143 mg  2 slices whole-wheat toast / 270 mg  1 tbs heart-healthy margarine / 153 mg  1 hard-boiled egg / 139 mg  1 small orange / 0 mg Lunch / Sodium (mg)  1 cup raw carrots / 76 mg   cup hummus / 298 mg  1 cup low-fat milk / 143 mg   cup red grapes / 2 mg  1 whole-wheat pita bread / 356 mg Dinner / Sodium (mg)  1 cup whole-wheat pasta / 2 mg  1 cup low-sodium tomato sauce / 73 mg  3 oz lean ground beef /  57 mg  1 small side salad (1 cup raw spinach leaves,  cup cucumber,  cup yellow bell pepper) with 1 tsp olive oil and 1 tsp red wine vinegar / 25 mg Snack / Sodium (mg)  1 container low-fat vanilla yogurt / 107 mg  3 graham cracker squares / 127 mg Nutrient Analysis  Calories: 2033  Protein: 77 g  Carbohydrate: 282 g  Fat: 72 g  Sodium: 1971 mg Document Released: 02/05/2005 Document Revised: 04/30/2011 Document Reviewed: 05/09/2009 [employer] Patient Information 2014 [place], [place].  Coronary Angiography with Stent Coronary angiography with stent placement is a procedure to widen or open a narrow blood vessel of the heart (coronary artery). When a coronary artery becomes partially blocked, it decreases blood flow to that area. This may lead to chest pain or a heart attack (myocardial infarction). Arteries may become blocked by cholesterol buildup (plaque) in the lining or wall.  A stent is a small piece of metal that looks like a mesh or a spring. Stent placement may be done right after a coronary angiography in which a blocked artery is found or as a treatment for a heart attack.  LET [facility] CARE PROVIDER KNOW ABOUT:  Any allergies you have.   All medicines you are taking, including vitamins, herbs, eye drops, creams, and over-the-counter medicines.   Previous problems you or members of your family have had with the use of anesthetics.   Any blood disorders you have.   Previous surgeries you have had.   Medical conditions you have. RISKS AND COMPLICATIONS Generally, coronary angiography with stent is a safe procedure. However, as with any procedure, complications can occur. Possible complications include:   Damage to the heart or its blood vessels.   A return of blockage.   Bleeding at the site.   Blood clot in another part of the body.   Kidney injury.   Allergic reaction to the dye or contrast used.  BEFORE THE PROCEDURE  Do not eat or drink  anything for 6 hours before the procedure.   Ask your health care provider if medicines can be taken with a sip of water.   Your health care provider will make sure you understand the procedure and the risks and potential complications associated with the procedure.  PROCEDURE  You may be given a medicine to help you relax before and during the procedure (sedative). This medicine will be given through an IV tube that is put into one of your veins.   The area where the catheter will be inserted is shaved and cleaned. This is usually done in the groin but may be done in the fold of your arm (near your elbow) or in the wrist.   A medicine will be given to numb the area where the catheter will be inserted (local anesthetic).   The catheter is inserted into an artery using a guide wire. A type  of X-ray (fluoroscopy) is used to help guide the catheter to the opening of the blocked artery.   A dye is then injected into the catheter, and X-rays are taken. The dye helps to show where any narrowing or blockages are located in the heart arteries.   A tiny wire is guided to the blocked spot, and a balloon is inflated to make the artery wider. The stent is expanded and crushes the plaque into the wall of the vessel. The stent holds the area open like a scaffolding and improves the blood flow.   Sometimes the artery may be made wider using a laser or other tools to remove plaque.   When the blood flow is better, the catheter is removed. The lining of the artery will grow over the stent, which stays where it was placed.  AFTER THE PROCEDURE  If the procedure is done through the leg, you will be kept in bed lying flat for about 6 hours. You will be instructed to not bend or cross your legs.   The insertion site will be checked frequently.   The pulse in your feet or wrist will be checked frequently.   Additional blood tests, X-rays, and electrocardiography may be done. Document  Released: 08/12/2002 Document Revised: 11/26/2012 Document Reviewed: 08/14/2012 [facility] Patient Information 2014 [place].

## 2013-03-11 NOTE — Assessment & Plan Note (Signed)
BP stable.

## 2013-03-11 NOTE — Progress Notes (Signed)
HPI: This is a 63 year old female patient of Dr. Rayann Heman who has a history of chronic atrial fib controlled with Multaq. She stopped her anticoagulation back in October 2014 because of hematuria on Pradaxa and was reluctant to restart. She also continues to smoke. She has CHA2Ds2VASc=5(HTN,DM, H/O lacunar infarcton MRI, female Echo 02/2012 EF 55-60%, mildly dilated LA. Patient was traveling to an [place] in [place] and suffered an MI. Dr. Carlis Stable called our office to discuss her history as she was transferred to a [facility] in [place]. She underwent stenting of a first diagonal that was 95% occluded. She received bare-metal stent because of her history of not taking anticoagulants and hematuria. She was supposed to be sent home on Plavix and aspirin but was never given the Plavix. Since she's been home she denies any chest pain, palpitations, dyspnea, dyspnea on exertion, dizziness, or presyncope. She is trying to cut back on her smoking and is down to 3 cigarettes daily.  Allergies:  -- Statins -- Other (See Comments)   --  Muscle pain  -- Sulfonamide Derivatives -- Hives   --  Nausea vertigo  Current Outpatient Prescriptions on File Prior to Visit: diazepam (VALIUM) 2 MG tablet, Take 1 tablet by mouth as needed., Disp: , Rfl:  diltiazem (CARDIZEM) 30 MG tablet, Take 1 tablet by mouth as needed., Disp: , Rfl:  dronedarone (MULTAQ) 400 MG tablet, Take 1 tablet (400 mg total) by mouth 2 (two) times daily with a meal., Disp: , Rfl:  glimepiride (AMARYL) 4 MG tablet, Take 4 mg by mouth daily before breakfast.  , Disp: , Rfl:  hydrochlorothiazide (HYDRODIURIL) 25 MG tablet, Take 25 mg by mouth daily. , Disp: , Rfl:  HYDROcodone-acetaminophen (NORCO/VICODIN) 5-325 MG per tablet, Take 1 tablet by mouth as needed., Disp: , Rfl:  ibuprofen (ADVIL,MOTRIN) 200 MG tablet, Take 600 mg by mouth every 6 (six) hours as needed for pain. , Disp: , Rfl:  metFORMIN (GLUCOPHAGE) 500 MG tablet, Take 1,000 mg by mouth  2 (two) times daily with a meal. , Disp: , Rfl:  Multiple Vitamin (MULTIVITAMIN) tablet, Take 1 tablet by mouth daily., Disp: , Rfl:  rosuvastatin (CRESTOR) 5 MG tablet, Take 5 mg by mouth daily., Disp: , Rfl:   tiotropium (SPIRIVA) 18 MCG inhalation capsule, Place 18 mcg into inhaler and inhale daily as needed (only when she feels short of breath). , Disp: , Rfl:   No current facility-administered medications on file prior to visit.   Past Medical History:   Diabetes mellitus                                            Hypertension                                                 PAF with post-termination pauses                               Comment:a. on dronedarone;  b. CHA2DS2VASc = 5 (HTN,               DM, h/o lacunar infarct on MRI, Female)               ->  refused oral anticoagulation after h/o               hematuria on pradaxa;  c. 02/2012 Echo: EF               55-60%, mildly dil LA. D. Recurrent PAF 10/2012               after only taking Multaq 1x/day - spont conv to              NSR, placed back on BID Multaq/eliquis.   COPD (chronic obstructive pulmonary disease)                 Obesity                                                      Chronic acquired lymphedema                                    Comment:a. R>L;  b. 03/2012 Neg LE U/S for DVT.   Osteoarthritis                                               Meniere disease                                              CVD (cardiovascular disease)                                 Hematuria                                                      Comment:a. while on pradaxa,  she reports that she has               seen Dr Margie Ege and had low risk cystoscopy   Tobacco abuse                                                Lacunar infarction                                             Comment:a. 02/2009 non-acute Lacunar infarct of the               right thalamus noted on MRI of brain.   Elevated TSH  Comment:a. 10/2012 - inst to f/u PCP.   Numbness and tingling                                        Stroke                                                         Comment:hx of "mini strokes" 4yrs ago per pt  Past Surgical History:   BREAST BIOPSY                                                 Biopsy of adrenal glands                                     Review of patient's family history indicates:   Heart attack                   Father                   Coronary artery disease        Father                     Comment: strong family hx   Leukemia                       Brother                  Cancer                         Mother                     Comment: bladder   Social History   Marital Status: Married             Spouse Name:                      Years of Education:                 Number of children:             Occupational History   None on file  Social History Main Topics   Smoking Status: Current Every Day Smoker        Packs/Day: 1.00  Years: 25        Types: Cigarettes   Smokeless Status: Never Used                       Comment: not ready to quit   Alcohol Use: No             Drug Use: No             Sexual Activity: No                 Other Topics  Concern   None on file  Social History Narrative   She operates an entertainment business    ROS: See history of present illness otherwise negative   PHYSICAL EXAM: Well-nournished, in no acute distress. Neck: No JVD, HJR, Bruit, or thyroid enlargement  Lungs: Decreased breath sounds throughout ,No tachypnea, clear without wheezing, rales, or rhonchi  Cardiovascular: RRR, PMI not displaced, heart sounds normal, no murmurs, gallops, bruit, thrill, or heave.  Abdomen: BS normal. Soft without organomegaly, masses, lesions or tenderness.  Extremities: +2 chronic lower extremity edema without cyanosis, clubbing. Good distal pulses bilateral  SKin: Warm, no lesions or rashes    Musculoskeletal: No deformities  Neuro: no focal signs  BP 152/79  Pulse 62  Ht 5\' 6"  (1.676 m)  Wt 200 lb (90.719 kg)  BMI 32.30 kg/m2     EKG: Normal sinus rhythm old septal infarct

## 2013-03-12 ENCOUNTER — Ambulatory Visit: Payer: Medicare Other | Admitting: Internal Medicine

## 2013-05-13 ENCOUNTER — Encounter: Payer: Self-pay | Admitting: Cardiology

## 2013-05-13 ENCOUNTER — Ambulatory Visit (INDEPENDENT_AMBULATORY_CARE_PROVIDER_SITE_OTHER): Payer: Medicare Other | Admitting: Cardiology

## 2013-05-13 VITALS — BP 126/70 | HR 55 | Ht 66.0 in | Wt 208.0 lb

## 2013-05-13 DIAGNOSIS — I2109 ST elevation (STEMI) myocardial infarction involving other coronary artery of anterior wall: Secondary | ICD-10-CM

## 2013-05-13 DIAGNOSIS — I2510 Atherosclerotic heart disease of native coronary artery without angina pectoris: Secondary | ICD-10-CM

## 2013-05-13 DIAGNOSIS — I4891 Unspecified atrial fibrillation: Secondary | ICD-10-CM

## 2013-05-13 MED ORDER — FLUTICASONE-SALMETEROL 250-50 MCG/DOSE IN AEPB: INHALATION_SPRAY | RESPIRATORY_TRACT | Status: DC

## 2013-05-13 MED ORDER — FEXOFENADINE HCL 180 MG PO TABS: 180.0000 mg | ORAL_TABLET | Freq: Every day | ORAL | Status: DC

## 2013-05-13 NOTE — Progress Notes (Signed)
Patient Name: Melanie Lucas Date of Encounter: 05/13/2013  Primary Care Provider:  Tawanna Solo, MD Primary Cardiologist:  Dr Rayann Heman  Problem List   Past Medical History  Diagnosis Date  . Diabetes mellitus   . Hypertension   . PAF with post-termination pauses     a. on dronedarone;  b. CHA2DS2VASc = 5 (HTN, DM, h/o lacunar infarct on MRI, Female) ->refused oral anticoagulation after h/o hematuria on pradaxa;  c. 02/2012 Echo: EF 55-60%, mildly dil LA. D. Recurrent PAF 10/2012 after only taking Multaq 1x/day - spont conv to NSR, placed back on BID Multaq/eliquis.  [name] COPD (chronic obstructive pulmonary disease)   . Obesity   . Chronic acquired lymphedema     a. R>L;  b. 03/2012 Neg LE U/S for DVT.  [name] Osteoarthritis   . Meniere disease   . CVD (cardiovascular disease)   . Hematuria     a. while on pradaxa,  she reports that she has seen Dr Margie Ege and had low risk cystoscopy  . Tobacco abuse   . Lacunar infarction     a. 02/2009 non-acute Lacunar infarct of the right thalamus noted on MRI of brain.  . Elevated TSH     a. 10/2012 - inst to f/u PCP.  . Numbness and tingling   . Stroke     hx of "mini strokes" 40yrs ago per pt   Past Surgical History  Procedure Laterality Date  . Breast biopsy    . Biopsy of adrenal glands      Allergies  Allergies  Allergen Reactions  . Statins Other (See Comments)    Muscle pain  . Sulfonamide Derivatives Hives    Nausea vertigo    HPI  This is a 63 year old female patient of Dr. Rayann Heman who has a history of chronic atrial fib controlled with Multaq. She stopped her anticoagulation back in October 2014 because of hematuria on Pradaxa and was reluctant to restart. She also continues to smoke. She has CHA2Ds2VASc=5 (HTN,DM, H/O lacunar infarcton MRI, female Echo 02/2012 EF 55-60%, mildly dilated LA.  Patient was traveling to an [place] in [place] and suffered an MI. Dr. Carlis Stable called our office to discuss her history as she was  transferred to a [facility] in [place]. She underwent stenting of a first diagonal that was 95% occluded. She received bare-metal stent because of her history of not taking anticoagulants and hematuria. She was supposed to be sent home on Plavix and aspirin but was never given the Plavix. Since she's been home she denies any chest pain, palpitations, dyspnea, dyspnea on exertion, dizziness, or presyncope. She is trying to cut back on her smoking and is down to 3 cigarettes daily.  Today the patient states that she has no more episodes of palpitations or chest heaviness but she has residual exertional dyspnea. She continues to smoke and doesn't use her inhalers. No chest pain, she has chronic lower extremity edema that started in her pregnancy and was previously treated with diuretics with no improvement.  Home Medications  Prior to Admission medications   Medication Sig Start Date End Date Taking? Authorizing Provider  albuterol (PROVENTIL HFA;VENTOLIN HFA) 108 (90 BASE) MCG/ACT inhaler Inhale into the lungs every 6 (six) hours as needed for wheezing or shortness of breath.   Yes Historical Provider, MD  aspirin 325 MG tablet Take 325 mg by mouth daily.   Yes Historical Provider, MD  diazepam (VALIUM) 2 MG tablet Take 1 tablet by mouth  as needed. 09/02/12  Yes Historical Provider, MD  diltiazem (CARDIZEM) 30 MG tablet Take 1 tablet by mouth as needed. 11/11/12  Yes Historical Provider, MD  dronedarone (MULTAQ) 400 MG tablet Take 1 tablet (400 mg total) by mouth 2 (two) times daily with a meal. 11/11/12  Yes Dayna N Dunn, PA-C  HYDROcodone-acetaminophen (NORCO/VICODIN) 5-325 MG per tablet Take 1 tablet by mouth as needed. 12/04/12  Yes Historical Provider, MD  ibuprofen (ADVIL,MOTRIN) 800 MG tablet Take 800 mg by mouth every 8 (eight) hours as needed for mild pain or moderate pain.   Yes Historical Provider, MD  metFORMIN (GLUCOPHAGE) 1000 MG tablet Take 1,000 mg by mouth 2 (two) times daily with a meal.    Yes Historical Provider, MD  Multiple Vitamin (MULTIVITAMIN) tablet Take 1 tablet by mouth daily.   Yes Historical Provider, MD  pioglitazone (ACTOS) 30 MG tablet Take 30 mg by mouth daily.   Yes Historical Provider, MD  propranolol (INDERAL) 20 MG tablet Take 20 mg by mouth as needed.   Yes Historical Provider, MD  rosuvastatin (CRESTOR) 5 MG tablet Take 5 mg by mouth daily.   Yes Historical Provider, MD  tiotropium (SPIRIVA) 18 MCG inhalation capsule Place 18 mcg into inhaler and inhale daily as needed (only when she feels short of breath).    Yes Historical Provider, MD  VALTREX 500 MG tablet 500 mg 2 (two) times daily as needed.  02/19/13  Yes Historical Provider, MD    Family History  Family History  Problem Relation Age of Onset  . Heart attack Father   . Coronary artery disease Father     strong family hx  . Leukemia Brother   . Cancer Mother     bladder    Social History  History   Social History  . Marital Status: Married    Spouse Name: N/A    Number of Children: N/A  . Years of Education: N/A   Occupational History  . Not on file.   Social History Main Topics  . Smoking status: Current Every Day Smoker -- 1.00 packs/day for 46 years    Types: Cigarettes  . Smokeless tobacco: Never Used     Comment: not ready to quit  . Alcohol Use: No  . Drug Use: No  . Sexual Activity: No   Other Topics Concern  . Not on file   Social History Narrative   She operates an entertainment business     Review of Systems, as per HPI, otherwise negative General:  No chills, fever, night sweats or weight changes.  Cardiovascular:  No chest pain, dyspnea on exertion, edema, orthopnea, palpitations, paroxysmal nocturnal dyspnea. Dermatological: No rash, lesions/masses Respiratory: No cough, dyspnea Urologic: No hematuria, dysuria Abdominal:   No nausea, vomiting, diarrhea, bright red blood per rectum, melena, or hematemesis Neurologic:  No visual changes, wkns, changes in  mental status. All other systems reviewed and are otherwise negative except as noted above.  Physical Exam  Blood pressure 126/70, pulse 55, height 5\' 6"  (1.676 m), weight 208 lb (94.348 kg).  General: Pleasant, NAD, obese Psych: Normal affect. Neuro: Alert and oriented X 3. Moves all extremities spontaneously. HEENT: Normal  Neck: Supple without bruits or JVD. Lungs:  Resp regular and unlabored, and expiratory disease bilaterally. Heart: RRR no s3, s4, or murmurs. No rubs Abdomen: Soft, non-tender, non-distended, BS + x 4.  Extremities: No clubbing, cyanosis severe pitting edema of both feet. DP/PT/Radials 2+ and equal bilaterally.  Labs:  No  results found for this basename: CKTOTAL, CKMB, TROPONINI,  in the last 72 hours Lab Results  Component Value Date   WBC 8.0 11/11/2012   HGB 14.0 11/11/2012   HCT 39.4 11/11/2012   MCV 90.8 11/11/2012   PLT 177 11/11/2012   No results found for this basename: NA, K, CL, CO2, BUN, CREATININE, CALCIUM, LABALBU, PROT, BILITOT, ALKPHOS, ALT, AST, GLUCOSE,  in the last 168 hours Lab Results  Component Value Date   CHOL 115 11/11/2012   HDL 44 11/11/2012   LDLCALC 49 11/11/2012   TRIG 112 11/11/2012   Lab Results  Component Value Date   DDIMER  Value: <0.22        AT THE INHOUSE ESTABLISHED CUTOFF VALUE OF 0.48 ug/mL FEU, THIS ASSAY HAS BEEN DOCUMENTED IN THE LITERATURE TO HAVE A SENSITIVITY AND NEGATIVE PREDICTIVE VALUE OF AT LEAST 98 TO 99%.  THE TEST RESULT SHOULD BE CORRELATED WITH AN ASSESSMENT OF THE CLINICAL PROBABILITY OF DVT / VTE. 03/19/2009   No components found with this basename: POCBNP,   Accessory Clinical Findings  Echocardiogram   ECG - sinus bradycardia, 55 beats per minute, 1 infarct age undetermined, unchanged from prior EKG in 03/11/2013   Assessment & Plan  64 -year-old female  1. Coronary artery disease - status post PCI with placement of bare-metal stents to the diagonal branch in [place] in January of 2015.  Patient never took Plavix currently on aspirin 325 mg daily. She is taking Crestor 5 mg daily, no beta blockers due to 2 COPD with wheezing. We will refer her to cardiac rehabilitation. We will perform an echocardiogram to evaluate for left ventricular ejection fraction after the infarction.  2. Hypertension - controlled on current regimen  3. A - fib Controlled presently Due to recent hematuria, she has stopped her anticoagulation.  She is reluctant to reconsider anticoagulation at this time  4. COPD - wheezing, patient is noncompliant with Spiriva, she was advised to take it daily, will also prescribe her Advair 250/50 2 puffs twice a day.  Followup in 2 months.   Dorothy Spark, MD, [facility] 05/13/2013, 4:25 PM

## 2013-05-13 NOTE — Patient Instructions (Signed)
Your physician has recommended you make the following change in your medication:   1. Start Advair 250/50 2 puffs twice a day.  2. Start Allegra 180 mg OTC 1 tablet daily.   Your physician has requested that you have an echocardiogram. Echocardiography is a painless test that uses sound waves to create images of your heart. It provides your doctor with information about the size and shape of your heart and how well your heart's chambers and valves are working. This procedure takes approximately one hour. There are no restrictions for this procedure.  Your physician recommends that you schedule a follow-up appointment in: 3 months with Dr. Meda Coffee.

## 2013-05-14 ENCOUNTER — Ambulatory Visit: Payer: Medicare Other | Admitting: Cardiology

## 2013-05-29 ENCOUNTER — Ambulatory Visit (HOSPITAL_COMMUNITY): Payer: Medicare Other | Attending: Cardiology | Admitting: Radiology

## 2013-05-29 ENCOUNTER — Encounter: Payer: Self-pay | Admitting: Cardiology

## 2013-05-29 DIAGNOSIS — I25810 Atherosclerosis of coronary artery bypass graft(s) without angina pectoris: Secondary | ICD-10-CM

## 2013-05-29 DIAGNOSIS — I2510 Atherosclerotic heart disease of native coronary artery without angina pectoris: Secondary | ICD-10-CM

## 2013-05-29 DIAGNOSIS — I2109 ST elevation (STEMI) myocardial infarction involving other coronary artery of anterior wall: Secondary | ICD-10-CM | POA: Insufficient documentation

## 2013-05-29 DIAGNOSIS — I4891 Unspecified atrial fibrillation: Secondary | ICD-10-CM

## 2013-05-29 NOTE — Progress Notes (Signed)
Echocardiogram performed.  

## 2013-06-04 ENCOUNTER — Ambulatory Visit (HOSPITAL_COMMUNITY): Payer: Medicare Other

## 2013-06-08 ENCOUNTER — Ambulatory Visit (HOSPITAL_COMMUNITY): Payer: Medicare Other

## 2013-06-10 ENCOUNTER — Ambulatory Visit (HOSPITAL_COMMUNITY): Payer: Medicare Other

## 2013-06-12 ENCOUNTER — Ambulatory Visit (HOSPITAL_COMMUNITY): Payer: Medicare Other

## 2013-06-15 ENCOUNTER — Ambulatory Visit (HOSPITAL_COMMUNITY): Payer: Medicare Other

## 2013-06-17 ENCOUNTER — Ambulatory Visit (HOSPITAL_COMMUNITY): Payer: Medicare Other

## 2013-06-19 ENCOUNTER — Ambulatory Visit (HOSPITAL_COMMUNITY): Payer: Medicare Other

## 2013-06-22 ENCOUNTER — Ambulatory Visit (HOSPITAL_COMMUNITY): Payer: Medicare Other

## 2013-06-24 ENCOUNTER — Ambulatory Visit (HOSPITAL_COMMUNITY): Payer: Medicare Other

## 2013-06-26 ENCOUNTER — Ambulatory Visit (HOSPITAL_COMMUNITY): Payer: Medicare Other

## 2013-06-29 ENCOUNTER — Ambulatory Visit (HOSPITAL_COMMUNITY): Payer: Medicare Other

## 2013-07-01 ENCOUNTER — Ambulatory Visit (HOSPITAL_COMMUNITY): Payer: Medicare Other

## 2013-07-03 ENCOUNTER — Ambulatory Visit (HOSPITAL_COMMUNITY): Payer: Medicare Other

## 2013-07-06 ENCOUNTER — Ambulatory Visit (HOSPITAL_COMMUNITY): Payer: Medicare Other

## 2013-07-08 ENCOUNTER — Ambulatory Visit (HOSPITAL_COMMUNITY): Payer: Medicare Other

## 2013-07-10 ENCOUNTER — Ambulatory Visit (HOSPITAL_COMMUNITY): Payer: Medicare Other

## 2013-07-15 ENCOUNTER — Ambulatory Visit (HOSPITAL_COMMUNITY): Payer: Medicare Other

## 2013-07-17 ENCOUNTER — Ambulatory Visit (HOSPITAL_COMMUNITY): Payer: Medicare Other

## 2013-07-20 ENCOUNTER — Ambulatory Visit (HOSPITAL_COMMUNITY): Payer: Medicare Other

## 2013-07-20 MED ORDER — DULOXETINE HCL 60 MG PO CPEP
60 MG | ORAL_CAPSULE | Freq: Every day | ORAL | Status: AC
Start: 2013-07-20 — End: ?

## 2013-07-20 NOTE — Telephone Encounter (Signed)
From: Joylene Igo  To: Teressa Senter, MD  Sent: 07/19/2013 5:40 PM EDT  Subject: Medication Renewal Request    Original authorizing provider: Teressa Senter, MD    Joylene Igo would like a refill of the following medications:  DULoxetine (CYMBALTA) 60 MG capsule Teressa Senter, MD]    Preferred pharmacy: [facility] [address] - [place], [place] - [address] - P [phone] - F [phone]    Comment:

## 2013-07-22 ENCOUNTER — Ambulatory Visit (HOSPITAL_COMMUNITY): Payer: Medicare Other

## 2013-07-24 ENCOUNTER — Ambulatory Visit (HOSPITAL_COMMUNITY): Payer: Medicare Other

## 2013-07-27 ENCOUNTER — Ambulatory Visit (HOSPITAL_COMMUNITY): Payer: Medicare Other

## 2013-07-29 ENCOUNTER — Ambulatory Visit (HOSPITAL_COMMUNITY): Payer: Medicare Other

## 2013-07-31 ENCOUNTER — Ambulatory Visit (HOSPITAL_COMMUNITY): Payer: Medicare Other

## 2013-08-03 ENCOUNTER — Ambulatory Visit (HOSPITAL_COMMUNITY): Payer: Medicare Other

## 2013-08-05 ENCOUNTER — Ambulatory Visit (HOSPITAL_COMMUNITY): Payer: Medicare Other

## 2013-08-07 ENCOUNTER — Ambulatory Visit (HOSPITAL_COMMUNITY): Payer: Medicare Other

## 2013-08-07 ENCOUNTER — Ambulatory Visit (INDEPENDENT_AMBULATORY_CARE_PROVIDER_SITE_OTHER): Payer: Medicare Other | Admitting: Cardiology

## 2013-08-07 ENCOUNTER — Encounter: Payer: Self-pay | Admitting: Cardiology

## 2013-08-07 VITALS — BP 159/76 | HR 58 | Ht 66.0 in | Wt 210.0 lb

## 2013-08-07 DIAGNOSIS — I2789 Other specified pulmonary heart diseases: Secondary | ICD-10-CM

## 2013-08-07 DIAGNOSIS — I10 Essential (primary) hypertension: Secondary | ICD-10-CM

## 2013-08-07 DIAGNOSIS — I4891 Unspecified atrial fibrillation: Secondary | ICD-10-CM

## 2013-08-07 DIAGNOSIS — I272 Pulmonary hypertension, unspecified: Secondary | ICD-10-CM | POA: Insufficient documentation

## 2013-08-07 DIAGNOSIS — R0789 Other chest pain: Secondary | ICD-10-CM

## 2013-08-07 LAB — BASIC METABOLIC PANEL
BUN: 16 mg/dL (ref 6–23)
CO2: 32 mEq/L (ref 19–32)
Calcium: 9.2 mg/dL (ref 8.4–10.5)
Chloride: 101 mEq/L (ref 96–112)
Creatinine, Ser: 0.9 mg/dL (ref 0.4–1.2)
GFR: 71.34 mL/min (ref >60.00)
Glucose, Bld: 201 mg/dL — ABNORMAL HIGH (ref 70–99)
Potassium: 3.4 mEq/L — ABNORMAL LOW (ref 3.5–5.1)
Sodium: 139 mEq/L (ref 135–145)

## 2013-08-07 NOTE — Progress Notes (Signed)
Patient ID: [name], female   DOB: 10-11-1950, 63 y.o.   MRN: [id number]    Patient Name: Melanie Lucas Date of Encounter: 08/07/2013  Primary Care Provider:  Tawanna Solo, MD Primary Cardiologist:  Dr Rayann Heman  Problem List   Past Medical History  Diagnosis Date  . Diabetes mellitus   . Hypertension   . PAF with post-termination pauses     a. on dronedarone;  b. CHA2DS2VASc = 5 (HTN, DM, h/o lacunar infarct on MRI, Female) ->refused oral anticoagulation after h/o hematuria on pradaxa;  c. 02/2012 Echo: EF 55-60%, mildly dil LA. D. Recurrent PAF 10/2012 after only taking Multaq 1x/day - spont conv to NSR, placed back on BID Multaq/eliquis.  [name] COPD (chronic obstructive pulmonary disease)   . Obesity   . Chronic acquired lymphedema     a. R>L;  b. 03/2012 Neg LE U/S for DVT.  [name] Osteoarthritis   . Meniere disease   . CVD (cardiovascular disease)   . Hematuria     a. while on pradaxa,  she reports that she has seen Dr Margie Ege and had low risk cystoscopy  . Tobacco abuse   . Lacunar infarction     a. 02/2009 non-acute Lacunar infarct of the right thalamus noted on MRI of brain.  . Elevated TSH     a. 10/2012 - inst to f/u PCP.  . Numbness and tingling   . Stroke     hx of "mini strokes" 66yrs ago per pt   Past Surgical History  Procedure Laterality Date  . Breast biopsy    . Biopsy of adrenal glands      Allergies  Allergies  Allergen Reactions  . Statins Other (See Comments)    Muscle pain  . Sulfonamide Derivatives Hives    Nausea vertigo    HPI  This is a 63 year old female patient of Dr. Rayann Heman who has a history of chronic atrial fib controlled with Multaq. She stopped her anticoagulation back in October 2014 because of hematuria on Pradaxa and was reluctant to restart. She also continues to smoke. She has CHA2Ds2VASc=5 (HTN,DM, H/O lacunar infarcton MRI, female Echo 02/2012 EF 55-60%, mildly dilated LA.  Patient was traveling to an [place] in [place] and suffered  an MI. Dr. Carlis Stable called our office to discuss her history as she was transferred to a [facility] in [place]. She underwent stenting of a first diagonal that was 95% occluded. She received bare-metal stent because of her history of not taking anticoagulants and hematuria. She was supposed to be sent home on Plavix and aspirin but was never given the Plavix. Since she's been home she denies any chest pain, palpitations, dyspnea, dyspnea on exertion, dizziness, or presyncope. She is trying to cut back on her smoking and is down to 3 cigarettes daily.  Today the patient states that she has no more episodes of palpitations or chest heaviness but she has residual exertional dyspnea. She continues to smoke and doesn't use her inhalers. No chest pain, she has chronic lower extremity edema that started in her pregnancy and was previously treated with diuretics with no improvement.  The patient is coming after 3 months. She states that she has been stressed with family problems but otherwise asymptomatic from chest pain, palpitations or syncope. She has a chronic lower extremity edema and Lasix was started recently included a certain degree. She still has shortness of breath on after starting superior it has improved. She continues to smoke. She is  planning to go on a big trip in [place] this Sunday.  Home Medications  Prior to Admission medications   Medication Sig Start Date End Date Taking? Authorizing Provider  albuterol (PROVENTIL HFA;VENTOLIN HFA) 108 (90 BASE) MCG/ACT inhaler Inhale into the lungs every 6 (six) hours as needed for wheezing or shortness of breath.   Yes Historical Provider, MD  aspirin 325 MG tablet Take 325 mg by mouth daily.   Yes Historical Provider, MD  diazepam (VALIUM) 2 MG tablet Take 1 tablet by mouth as needed. 09/02/12  Yes Historical Provider, MD  diltiazem (CARDIZEM) 30 MG tablet Take 1 tablet by mouth as needed. 11/11/12  Yes Historical Provider, MD  dronedarone (MULTAQ) 400 MG  tablet Take 1 tablet (400 mg total) by mouth 2 (two) times daily with a meal. 11/11/12  Yes Dayna N Dunn, PA-C  HYDROcodone-acetaminophen (NORCO/VICODIN) 5-325 MG per tablet Take 1 tablet by mouth as needed. 12/04/12  Yes Historical Provider, MD  ibuprofen (ADVIL,MOTRIN) 800 MG tablet Take 800 mg by mouth every 8 (eight) hours as needed for mild pain or moderate pain.   Yes Historical Provider, MD  metFORMIN (GLUCOPHAGE) 1000 MG tablet Take 1,000 mg by mouth 2 (two) times daily with a meal.   Yes Historical Provider, MD  Multiple Vitamin (MULTIVITAMIN) tablet Take 1 tablet by mouth daily.   Yes Historical Provider, MD  pioglitazone (ACTOS) 30 MG tablet Take 30 mg by mouth daily.   Yes Historical Provider, MD  propranolol (INDERAL) 20 MG tablet Take 20 mg by mouth as needed.   Yes Historical Provider, MD  rosuvastatin (CRESTOR) 5 MG tablet Take 5 mg by mouth daily.   Yes Historical Provider, MD  tiotropium (SPIRIVA) 18 MCG inhalation capsule Place 18 mcg into inhaler and inhale daily as needed (only when she feels short of breath).    Yes Historical Provider, MD  VALTREX 500 MG tablet 500 mg 2 (two) times daily as needed.  02/19/13  Yes Historical Provider, MD    Family History  Family History  Problem Relation Age of Onset  . Heart attack Father   . Coronary artery disease Father     strong family hx  . Leukemia Brother   . Cancer Mother     bladder    Social History  History   Social History  . Marital Status: Married    Spouse Name: N/A    Number of Children: N/A  . Years of Education: N/A   Occupational History  . Not on file.   Social History Main Topics  . Smoking status: Current Every Day Smoker -- 1.00 packs/day for 46 years    Types: Cigarettes  . Smokeless tobacco: Never Used     Comment: not ready to quit  . Alcohol Use: No  . Drug Use: No  . Sexual Activity: No   Other Topics Concern  . Not on file   Social History Narrative   She operates an entertainment  business     Review of Systems, as per HPI, otherwise negative General:  No chills, fever, night sweats or weight changes.  Cardiovascular:  No chest pain, dyspnea on exertion, edema, orthopnea, palpitations, paroxysmal nocturnal dyspnea. Dermatological: No rash, lesions/masses Respiratory: No cough, dyspnea Urologic: No hematuria, dysuria Abdominal:   No nausea, vomiting, diarrhea, bright red blood per rectum, melena, or hematemesis Neurologic:  No visual changes, wkns, changes in mental status. All other systems reviewed and are otherwise negative except as noted above.  Physical Exam  Blood pressure 159/76, pulse 58, height 5\' 6"  (1.676 m), weight 210 lb (95.255 kg).  General: Pleasant, NAD, obese Psych: Normal affect. Neuro: Alert and oriented X 3. Moves all extremities spontaneously. HEENT: Normal  Neck: Supple without bruits or JVD. Lungs:  Resp regular and unlabored, no wheezing. Heart: RRR no s3, s4, or murmurs. No rubs Abdomen: Soft, non-tender, non-distended, BS + x 4.  Extremities: No clubbing, cyanosis severe pitting edema of both feet. DP/PT/Radials 2+ and equal bilaterally.  Labs:  No results found for this basename: CKTOTAL, CKMB, TROPONINI,  in the last 72 hours Lab Results  Component Value Date   WBC 8.0 11/11/2012   HGB 14.0 11/11/2012   HCT 39.4 11/11/2012   MCV 90.8 11/11/2012   PLT 177 11/11/2012   No results found for this basename: NA, K, CL, CO2, BUN, CREATININE, CALCIUM, LABALBU, PROT, BILITOT, ALKPHOS, ALT, AST, GLUCOSE,  in the last 168 hours Lab Results  Component Value Date   CHOL 115 11/11/2012   HDL 44 11/11/2012   LDLCALC 49 11/11/2012   TRIG 112 11/11/2012   Lab Results  Component Value Date   DDIMER  Value: <0.22        AT THE INHOUSE ESTABLISHED CUTOFF VALUE OF 0.48 ug/mL FEU, THIS ASSAY HAS BEEN DOCUMENTED IN THE LITERATURE TO HAVE A SENSITIVITY AND NEGATIVE PREDICTIVE VALUE OF AT LEAST 98 TO 99%.  THE TEST RESULT SHOULD BE CORRELATED WITH AN  ASSESSMENT OF THE CLINICAL PROBABILITY OF DVT / VTE. 03/19/2009   No components found with this basename: POCBNP,   Accessory Clinical Findings  Echocardiogram  - 05/29/2013 Study Conclusions  - Left ventricle: Cannot rule out a very small area of apical hypokinesis The cavity size was normal. Systolic function was normal. The estimated ejection fraction was 55%. Wall motion was normal; there were no regional wall motion abnormalities. Features are consistent with a pseudonormal left ventricular filling pattern, with concomitant abnormal relaxation and increased filling pressure (grade 2 diastolic dysfunction). - Left atrium: The atrium was moderately dilated. - Atrial septum: There was increased thickness of the septum, consistent with lipomatous hypertrophy. - Pulmonary arteries: PA peak pressure: 54mm Hg (S). Impressions:  - The right ventricular systolic pressure was increased consistent with mild pulmonary hypertension.  ECG - sinus bradycardia, 55 beats per minute, 1 infarct age undetermined, unchanged from prior EKG in 03/11/2013   Assessment & Plan  52 -year-old female  1. Coronary artery disease - status post PCI with placement of bare-metal stents to the diagonal branch in [place] in January of 2015. Patient never took Plavix currently on aspirin 325 mg daily. She is taking Crestor 5 mg daily, no beta blockers due to 2 COPD with wheezing. The patient is currently asymptomatic. It was reinforced that she should stop smoking. We will refer her to cardiac rehabilitation. Echocardiogram was performed to evaluate for possible wall motion abnormalities after myocardial infarction and she has preserved LVEF and no regional wall motion abnormalities. Echo also showed mild pulmonary hypertension is most probably secondary to chronic COPD and her ongoing smoking.  2. Hypertension - elevated however she states she has been stressed and is always controlled at home recheck blood  pressure in the office today was 135/78.  3. A - fib Controlled presently Due to recent hematuria, she has stopped her anticoagulation.  She is reluctant to reconsider anticoagulation at this time  4. COPD - wheezing resolved, patient was noncompliant with Spiriva, but now since she is taking it  on a daily basis she feels significantly better. She didn't tolerate Advair.  Followup in 6 months.   Dorothy Spark, MD, [facility] 08/07/2013, 2:31 PM

## 2013-08-07 NOTE — Patient Instructions (Signed)
Your physician recommends that you continue on your current medications as directed. Please refer to the Current Medication list given to you today.  Your physician recommends that you return for lab work in: today (bmet)  Your physician wants you to follow-up in: 6 months with Dr Johann Capers will receive a reminder letter in the mail two months in advance. If you don't receive a letter, please call our office to schedule the follow-up appointment.

## 2013-08-10 ENCOUNTER — Ambulatory Visit (HOSPITAL_COMMUNITY): Payer: Medicare Other

## 2013-08-10 ENCOUNTER — Telehealth: Payer: Self-pay | Admitting: *Deleted

## 2013-08-10 NOTE — Telephone Encounter (Signed)
Message copied by Nuala Alpha on Mon Aug 10, 2013  5:26 PM ------      Message from: Dorothy Spark      Created: Fri Aug 07, 2013  4:50 PM       The patient needs to be started on potassium 10 mEq daily as she was just recently started on Lasix and her potassium is low. ------

## 2013-08-10 NOTE — Telephone Encounter (Signed)
Have attempted to call and lvm for pt and EC to call the office to review recent lab work and Dr Meda Coffee recommendation for pt to start on K 10 mEq daily due to low K.

## 2013-08-11 ENCOUNTER — Encounter: Payer: Self-pay | Admitting: *Deleted

## 2013-08-11 ENCOUNTER — Telehealth: Payer: Self-pay | Admitting: *Deleted

## 2013-08-11 NOTE — Telephone Encounter (Signed)
Message copied by Nuala Alpha on Tue Aug 11, 2013  3:43 PM ------      Message from: Dorothy Spark      Created: Fri Aug 07, 2013  4:50 PM       The patient needs to be started on potassium 10 mEq daily as she was just recently started on Lasix and her potassium is low. ------

## 2013-08-11 NOTE — Telephone Encounter (Signed)
Letter to f/u sent on 6/23.

## 2013-08-11 NOTE — Telephone Encounter (Signed)
lmtcb several times

## 2013-08-12 ENCOUNTER — Ambulatory Visit (HOSPITAL_COMMUNITY): Payer: Medicare Other

## 2013-08-12 ENCOUNTER — Telehealth: Payer: Self-pay | Admitting: *Deleted

## 2013-08-12 NOTE — Telephone Encounter (Signed)
Message copied by Nuala Alpha on Wed Aug 12, 2013  2:11 PM ------      Message from: Dorothy Spark      Created: Fri Aug 07, 2013  4:50 PM       The patient needs to be started on potassium 10 mEq daily as she was just recently started on Lasix and her potassium is low. ------

## 2013-08-12 NOTE — Telephone Encounter (Signed)
LMTCB on pts phone and EC phone to call and endorse n.o.for pt to start on K 10 mEq daily per Dr Meda Coffee. Have been calling pt since 6/19 with no response.

## 2013-08-13 ENCOUNTER — Telehealth: Payer: Self-pay | Admitting: *Deleted

## 2013-08-13 NOTE — Telephone Encounter (Signed)
Message copied by Nuala Alpha on Thu Aug 13, 2013  9:51 AM ------      Message from: Dorothy Spark      Created: Fri Aug 07, 2013  4:50 PM       The patient needs to be started on potassium 10 mEq daily as she was just recently started on Lasix and her potassium is low. ------

## 2013-08-13 NOTE — Telephone Encounter (Signed)
LMTCB

## 2013-08-14 ENCOUNTER — Ambulatory Visit (HOSPITAL_COMMUNITY): Payer: Medicare Other

## 2013-08-17 ENCOUNTER — Ambulatory Visit (HOSPITAL_COMMUNITY): Payer: Medicare Other

## 2013-08-18 ENCOUNTER — Telehealth: Payer: Self-pay | Admitting: *Deleted

## 2013-08-18 MED ORDER — POTASSIUM CHLORIDE CRYS ER 10 MEQ PO TBCR: 10.0000 meq | EXTENDED_RELEASE_TABLET | Freq: Every day | ORAL | Status: DC

## 2013-08-18 NOTE — Telephone Encounter (Signed)
Pt was contacted about potassium being low on recent lab work, and needing to start taking K 10 mEq daily as she just recently started taking Lasix.  Pt stated she is in [place] right now and will be unable to start this med until she gets home.  Informed pt to increase the potassium in her diet and gave her examples of different foods high in K.  Informed pt to contact our office when she returns to the [place] so we can arrange for her new med orders.  Pt verbalized understanding of all instructions given and agrees with this plan.  Will forward this to Dr Meda Coffee for her review.

## 2013-08-18 NOTE — Telephone Encounter (Signed)
Message copied by Nuala Alpha on Tue Aug 18, 2013 12:24 PM ------      Message from: Dorothy Spark      Created: Fri Aug 07, 2013  4:50 PM       The patient needs to be started on potassium 10 mEq daily as she was just recently started on Lasix and her potassium is low. ------

## 2013-08-18 NOTE — Telephone Encounter (Signed)
I agree

## 2013-08-19 ENCOUNTER — Ambulatory Visit (HOSPITAL_COMMUNITY): Payer: Medicare Other

## 2013-08-24 ENCOUNTER — Ambulatory Visit (HOSPITAL_COMMUNITY): Payer: Medicare Other

## 2013-08-26 ENCOUNTER — Ambulatory Visit (HOSPITAL_COMMUNITY): Payer: Medicare Other

## 2013-08-28 ENCOUNTER — Ambulatory Visit (HOSPITAL_COMMUNITY): Payer: Medicare Other

## 2013-08-31 ENCOUNTER — Ambulatory Visit (HOSPITAL_COMMUNITY): Payer: Medicare Other

## 2013-09-02 ENCOUNTER — Other Ambulatory Visit (HOSPITAL_COMMUNITY)
Admission: RE | Admit: 2013-09-02 | Discharge: 2013-09-02 | Disposition: A | Discharge status: Home / Self Care | Payer: Medicare Other | Source: Ambulatory Visit | Attending: Family Medicine | Admitting: Family Medicine

## 2013-09-02 ENCOUNTER — Other Ambulatory Visit: Payer: Self-pay | Admitting: Family Medicine

## 2013-09-02 ENCOUNTER — Ambulatory Visit (HOSPITAL_COMMUNITY): Payer: Medicare Other

## 2013-09-02 DIAGNOSIS — Z1151 Encounter for screening for human papillomavirus (HPV): Secondary | ICD-10-CM | POA: Insufficient documentation

## 2013-09-02 DIAGNOSIS — Z124 Encounter for screening for malignant neoplasm of cervix: Secondary | ICD-10-CM | POA: Insufficient documentation

## 2013-09-04 ENCOUNTER — Ambulatory Visit (HOSPITAL_COMMUNITY): Payer: Medicare Other

## 2013-09-04 LAB — CYTOLOGY - PAP

## 2013-09-07 ENCOUNTER — Ambulatory Visit (HOSPITAL_COMMUNITY): Payer: Medicare Other

## 2013-09-09 ENCOUNTER — Ambulatory Visit (HOSPITAL_COMMUNITY): Payer: Medicare Other

## 2013-09-11 ENCOUNTER — Ambulatory Visit (HOSPITAL_COMMUNITY): Payer: Medicare Other

## 2013-09-14 ENCOUNTER — Ambulatory Visit (HOSPITAL_COMMUNITY): Payer: Medicare Other

## 2013-09-16 ENCOUNTER — Ambulatory Visit (HOSPITAL_COMMUNITY): Payer: Medicare Other

## 2014-04-13 ENCOUNTER — Encounter (HOSPITAL_COMMUNITY): Payer: Self-pay

## 2014-04-13 ENCOUNTER — Ambulatory Visit (HOSPITAL_COMMUNITY)
Admission: RE | Admit: 2014-04-13 | Discharge: 2014-04-13 | Disposition: A | Discharge status: Home / Self Care | Payer: Medicare Other | Source: Ambulatory Visit | Attending: Family Medicine | Admitting: Family Medicine

## 2014-04-13 DIAGNOSIS — M810 Age-related osteoporosis without current pathological fracture: Secondary | ICD-10-CM | POA: Diagnosis present

## 2014-04-13 MED ORDER — SODIUM CHLORIDE 0.9 % IV SOLN
INTRAVENOUS | Status: DC
  Administered 2014-04-13: 12:00:00 via INTRAVENOUS

## 2014-04-13 MED ORDER — ZOLEDRONIC ACID 5 MG/100ML IV SOLN
5.0000 mg | Freq: Once | INTRAVENOUS | Status: AC
  Administered 2014-04-13: 5 mg via INTRAVENOUS
  Filled 2014-04-13: qty 100

## 2014-04-13 NOTE — Discharge Instructions (Signed)

## 2014-07-16 ENCOUNTER — Encounter: Payer: Self-pay | Admitting: Emergency Medicine

## 2014-07-16 DIAGNOSIS — K047 Periapical abscess without sinus: Secondary | ICD-10-CM | POA: Diagnosis not present

## 2014-07-16 DIAGNOSIS — Z720 Tobacco use: Secondary | ICD-10-CM | POA: Diagnosis not present

## 2014-07-16 DIAGNOSIS — Z79899 Other long term (current) drug therapy: Secondary | ICD-10-CM | POA: Insufficient documentation

## 2014-07-16 DIAGNOSIS — Z7982 Long term (current) use of aspirin: Secondary | ICD-10-CM | POA: Diagnosis not present

## 2014-07-16 DIAGNOSIS — I10 Essential (primary) hypertension: Secondary | ICD-10-CM | POA: Diagnosis not present

## 2014-07-16 DIAGNOSIS — E119 Type 2 diabetes mellitus without complications: Secondary | ICD-10-CM | POA: Diagnosis not present

## 2014-07-16 DIAGNOSIS — R220 Localized swelling, mass and lump, head: Secondary | ICD-10-CM | POA: Diagnosis present

## 2014-07-16 LAB — BASIC METABOLIC PANEL
Anion gap: 7 (ref 5–15)
BUN: 16 mg/dL (ref 6–20)
CO2: 28 mmol/L (ref 22–32)
CREATININE: 0.81 mg/dL (ref 0.44–1.00)
Calcium: 8.7 mg/dL — ABNORMAL LOW (ref 8.9–10.3)
Chloride: 104 mmol/L (ref 101–111)
GFR calc Af Amer: >60 mL/min (ref >60)
GFR calc non Af Amer: >60 mL/min (ref >60)
Glucose, Bld: 163 mg/dL — ABNORMAL HIGH (ref 65–99)
POTASSIUM: 3.7 mmol/L (ref 3.5–5.1)
Sodium: 139 mmol/L (ref 135–145)

## 2014-07-16 LAB — CBC WITH DIFFERENTIAL/PLATELET
BASOS ABS: 0.1 10*3/uL (ref 0–0.1)
Basophils Relative: 1 %
EOS PCT: 1 %
Eosinophils Absolute: 0.1 10*3/uL (ref 0–0.7)
HCT: 42.2 % (ref 35.0–47.0)
Hemoglobin: 14.7 g/dL (ref 12.0–16.0)
Lymphocytes Relative: 17 %
Lymphs Abs: 1.7 10*3/uL (ref 1.0–3.6)
MCH: 31.5 pg (ref 26.0–34.0)
MCHC: 34.9 g/dL (ref 32.0–36.0)
MCV: 90.4 fL (ref 80.0–100.0)
MONOS PCT: 7 %
Monocytes Absolute: 0.7 10*3/uL (ref 0.2–0.9)
Neutro Abs: 7.5 10*3/uL — ABNORMAL HIGH (ref 1.4–6.5)
Neutrophils Relative %: 74 %
PLATELETS: 176 10*3/uL (ref 150–440)
RBC: 4.67 MIL/uL (ref 3.80–5.20)
RDW: 14 % (ref 11.5–14.5)
WBC: 10.2 10*3/uL (ref 3.6–11.0)

## 2014-07-16 NOTE — ED Notes (Signed)
Pt discussed with Joni Fears, MD. Orders placed.

## 2014-07-16 NOTE — ED Notes (Signed)
Pt presents to ED with c/o swelling to left jaw area. Pt reports recent dental surgery and continued issues with teeth on the lower left side of her mouth. Pt reports that last night she began having pain to her left jaw and today the area became very swollen. Pt reports that she called her dentist this morning and was prescribed antibiotics and pain medications which she began today. Pt states, however, that her jaw is "twice as big" as it was this morning. Pt denies increased difficulty breathing or swallowing. Pt is awake and alert during triage; speaking in complete coherent sentences without difficulty.

## 2014-07-17 ENCOUNTER — Emergency Department
Admission: EM | Admit: 2014-07-17 | Discharge: 2014-07-17 | Disposition: A | Discharge status: Home / Self Care | Payer: Medicare Other | Attending: Emergency Medicine | Admitting: Emergency Medicine

## 2014-07-17 DIAGNOSIS — K047 Periapical abscess without sinus: Secondary | ICD-10-CM

## 2014-07-17 MED ORDER — OXYCODONE-ACETAMINOPHEN 5-325 MG PO TABS
ORAL_TABLET | ORAL | Status: AC
  Administered 2014-07-17: 1 via ORAL
  Filled 2014-07-17: qty 1

## 2014-07-17 MED ORDER — CLINDAMYCIN HCL 150 MG PO CAPS: 450.0000 mg | ORAL_CAPSULE | Freq: Three times a day (TID) | ORAL | Status: DC

## 2014-07-17 MED ORDER — CLINDAMYCIN HCL 150 MG PO CAPS
ORAL_CAPSULE | ORAL | Status: AC
  Administered 2014-07-17: 03:00:00 450 mg via ORAL
  Filled 2014-07-17: qty 3

## 2014-07-17 MED ORDER — OXYCODONE-ACETAMINOPHEN 5-325 MG PO TABS
1.0000 | ORAL_TABLET | Freq: Once | ORAL | Status: AC
  Administered 2014-07-17: 1 via ORAL

## 2014-07-17 MED ORDER — CLINDAMYCIN HCL 150 MG PO CAPS
450.0000 mg | ORAL_CAPSULE | Freq: Once | ORAL | Status: AC
  Administered 2014-07-17: 450 mg via ORAL

## 2014-07-17 MED ORDER — OXYCODONE-ACETAMINOPHEN 5-325 MG PO TABS: 1.0000 | ORAL_TABLET | Freq: Four times a day (QID) | ORAL | Status: DC | PRN

## 2014-07-17 NOTE — ED Provider Notes (Signed)
[facility] Emergency Department Provider Note  ____________________________________________  Time seen: Approximately 150 AM  I have reviewed the triage vital signs and the nursing notes.   HISTORY  Chief Complaint Facial Swelling    HPI Melanie Lucas is a 64 y.o. female with history of poor dentition and diabetes who presents with 2 days of left-sided dental pain with swelling. The patient says that she can feel one of her left posterior molars moving and extend may be the issue with tooth. Says she has had partials which are close to that tooth which thinks may have just the tooth out of position. She denies any fevers, nausea or vomiting. Called her dentist today who prescribed clindamycin. We'll not be able to see the dentist until this Tuesday. Says this feels like a dental abscess she had as a child when she was 10 which she said she pushed until it popped. No difficulty breathing.   Past Medical History  Diagnosis Date  . Diabetes mellitus   . Hypertension   . PAF with post-termination pauses     a. on dronedarone;  b. CHA2DS2VASc = 5 (HTN, DM, h/o lacunar infarct on MRI, Female) ->refused oral anticoagulation after h/o hematuria on pradaxa;  c. 02/2012 Echo: EF 55-60%, mildly dil LA. D. Recurrent PAF 10/2012 after only taking Multaq 1x/day - spont conv to NSR, placed back on BID Multaq/eliquis.  [name] COPD (chronic obstructive pulmonary disease)   . Obesity   . Chronic acquired lymphedema     a. R>L;  b. 03/2012 Neg LE U/S for DVT.  [name] Osteoarthritis   . Meniere disease   . CVD (cardiovascular disease)   . Hematuria     a. while on pradaxa,  she reports that she has seen Dr Margie Ege and had low risk cystoscopy  . Tobacco abuse   . Lacunar infarction     a. 02/2009 non-acute Lacunar infarct of the right thalamus noted on MRI of brain.  . Elevated TSH     a. 10/2012 - inst to f/u PCP.  . Numbness and tingling   . Stroke     hx of "mini strokes" 39yrs ago per pt     Patient Active Problem List   Diagnosis Date Noted  . Pulmonary hypertension 08/07/2013  . CAD (coronary artery disease) of artery bypass graft 03/11/2013  . Peripheral edema 02/25/2012  . ESSENTIAL HYPERTENSION, BENIGN 05/06/2009  . OVERWEIGHT/OBESITY 04/13/2009  . TOBACCO ABUSE 04/13/2009  . ATRIAL FIBRILLATION 04/13/2009  . CHEST PAIN, ATYPICAL 04/13/2009    Past Surgical History  Procedure Laterality Date  . Breast biopsy    . Biopsy of adrenal glands      Current Outpatient Rx  Name  Route  Sig  Dispense  Refill  . albuterol (PROVENTIL HFA;VENTOLIN HFA) 108 (90 BASE) MCG/ACT inhaler   Inhalation   Inhale into the lungs every 6 (six) hours as needed for wheezing or shortness of breath.         [name] aspirin 325 MG tablet   Oral   Take 325 mg by mouth daily.         [name] CALCIUM PO   Oral   Take 1,000 mg by mouth daily.         . diazepam (VALIUM) 2 MG tablet   Oral   Take 1 tablet by mouth as needed.         . dronedarone (MULTAQ) 400 MG tablet   Oral   Take 1 tablet (  400 mg total) by mouth 2 (two) times daily with a meal.         . fexofenadine (ALLEGRA) 180 MG tablet   Oral   Take 1 tablet (180 mg total) by mouth daily.         . Fluticasone-Salmeterol (ADVAIR) 250-50 MCG/DOSE AEPB      as needed.         [name] GABAPENTIN PO   Oral   Take by mouth daily.         [name] HYDROcodone-acetaminophen (NORCO/VICODIN) 5-325 MG per tablet   Oral   Take 1 tablet by mouth as needed.         [name] ibuprofen (ADVIL,MOTRIN) 800 MG tablet   Oral   Take 800 mg by mouth every 8 (eight) hours as needed for mild pain or moderate pain.         . metFORMIN (GLUCOPHAGE) 1000 MG tablet   Oral   Take 1,000 mg by mouth 2 (two) times daily with a meal.         . Multiple Vitamin (MULTIVITAMIN) tablet   Oral   Take 1 tablet by mouth daily.         . pioglitazone (ACTOS) 30 MG tablet   Oral   Take 30 mg by mouth daily.         . potassium chloride  (K-DUR,KLOR-CON) 10 MEQ tablet   Oral   Take 1 tablet (10 mEq total) by mouth daily.   30 tablet   6   . propranolol (INDERAL) 20 MG tablet   Oral   Take 20 mg by mouth as needed.         . rosuvastatin (CRESTOR) 5 MG tablet   Oral   Take 5 mg by mouth daily.         [name] tiotropium (SPIRIVA) 18 MCG inhalation capsule   Inhalation   Place 18 mcg into inhaler and inhale daily as needed (only when she feels short of breath).          [name] VALTREX 500 MG tablet      500 mg 2 (two) times daily as needed.            Allergies Statins and Sulfonamide derivatives  Family History  Problem Relation Age of Onset  . Heart attack Father   . Coronary artery disease Father     strong family hx  . Leukemia Brother   . Cancer Mother     bladder    Social History History  Substance Use Topics  . Smoking status: Current Every Day Smoker -- 1.00 packs/day for 46 years    Types: Cigarettes  . Smokeless tobacco: Never Used     Comment: not ready to quit  . Alcohol Use: No    Review of Systems Constitutional: No fever/chills Eyes: No visual changes. ENT: No sore throat. Cardiovascular: Denies chest pain. Respiratory: Denies shortness of breath. Gastrointestinal: No abdominal pain.  No nausea, no vomiting.  No diarrhea.  No constipation. Genitourinary: Negative for dysuria. Musculoskeletal: Negative for back pain. Skin: Negative for rash. Neurological: Negative for headaches, focal weakness or numbness.  10-point ROS otherwise negative.  ____________________________________________   PHYSICAL EXAM:  VITAL SIGNS: ED Triage Vitals  Enc Vitals Group     BP 07/16/14 2039 152/74 mmHg     Pulse Rate 07/16/14 2039 58     Resp 07/16/14 2039 18     Temp 07/16/14 2039 98.4 F (36.9 C)  Temp src --      SpO2 07/16/14 2039 97 %     Weight 07/16/14 2039 210 lb (95.255 kg)     Height 07/16/14 2039 5\' 7"  (1.702 m)     Head Cir --      Peak Flow --      Pain Score  07/16/14 2040 8     Pain Loc --      Pain Edu? --      Excl. in [place]? --     Constitutional: Alert and oriented. Well appearing and in no acute distress. Eyes: Conjunctivae are normal. PERRL. EOMI. Head: Atraumatic. Nose: No congestion/rhinnorhea. Mouth/Throat: Mucous membranes are moist.  Left-sided facial swelling. With tenderness to palpation. No brawny edema to the floor the mouth. The patient is controlling her secretions. Tooth #19 is slightly mobile and painful. There is tenderness to the mucosa just lateral to tooth 19. No definitive fluctuance Neck: No stridor.   Cardiovascular: Normal rate, regular rhythm. Grossly normal heart sounds.  Good peripheral circulation. Respiratory: Normal respiratory effort.  No retractions. Lungs CTAB. Gastrointestinal: Soft and nontender. No distention. No abdominal bruits. No CVA tenderness. Musculoskeletal: No lower extremity tenderness nor edema.  No joint effusions. Neurologic:  Normal speech and language. No gross focal neurologic deficits are appreciated. Speech is normal. No gait instability. Skin:  Skin is warm, dry and intact. No rash noted. Psychiatric: Mood and affect are normal. Speech and behavior are normal.  ____________________________________________   LABS (all labs ordered are listed, but only abnormal results are displayed)  Labs Reviewed  CBC WITH DIFFERENTIAL/PLATELET - Abnormal; Notable for the following:    Neutro Abs 7.5 (*)    All other components within normal limits  BASIC METABOLIC PANEL - Abnormal; Notable for the following:    Glucose, Bld 163 (*)    Calcium 8.7 (*)    All other components within normal limits   ____________________________________________  EKG   ____________________________________________  RADIOLOGY   ____________________________________________   PROCEDURES  INCISION AND DRAINAGE Performed by: Doran Stabler Consent: Verbal consent obtained. Risks and benefits: risks,  benefits and alternatives were discussed  Placed ultrasound probe and outside of left face over point of maximum swelling. Cellulitis with possible small stripe of pus/ fluid.  Type: abscess/cellulitis  Body area: Left mandible  Anesthesia: local infiltration, patient with large tongue. Unable to access posterior mandible. Unable to the give regional nerve block. Infiltrated with Marcaine 3 cc directly over the area of maximum tenderness and fluctuance.  Incision was made with a scalpel. 11 blade to area of maximal tenderness.  Local anesthetic: 3 mL of Marcaine Anesthetic total: 3 ml  Complexity: complex Blunt dissection to break up loculations  Drainage: Bloody with small amount of pus  Drainage amount: 5 cc   Packing material: No packing  Patient tolerance: Patient tolerated the procedure well with no immediate complications.     ____________________________________________   INITIAL IMPRESSION / ASSESSMENT AND PLAN / ED COURSE  Pertinent labs & imaging results that were available during my care of the patient were reviewed by me and considered in my medical decision making (see chart for details).  ----------------------------------------- 3:15 AM on 07/17/2014 -----------------------------------------  Patient still tolerating secretions and with patent airway without any difficulty breathing. Discussed case with Dr. Sheppard Coil who is the ENT doctor on call. Says does not do dental abscesses. We'll increase the patient's dose of clindamycin 450 mg 3 times a day for the next 10 days. Patient given  strict return instructions that if she is any difficulty breathing or has swelling that is increased especially to the bottom/floor of the mouth that she needs to return to the emergency department immediately. Patient understands these plans as well as her husband is willing to comply. ____________________________________________   FINAL CLINICAL IMPRESSION(S) / ED  DIAGNOSES  Facial swelling. Facial cellulitis possible early abscess. Acute, initial visit.    Orbie Pyo, MD 07/17/14 [id number]

## 2014-07-17 NOTE — Discharge Instructions (Signed)

## 2014-10-12 ENCOUNTER — Emergency Department (HOSPITAL_COMMUNITY): Payer: Medicare Other

## 2014-10-12 ENCOUNTER — Encounter (HOSPITAL_COMMUNITY): Payer: Self-pay | Admitting: *Deleted

## 2014-10-12 ENCOUNTER — Emergency Department (HOSPITAL_COMMUNITY)
Admission: EM | Admit: 2014-10-12 | Discharge: 2014-10-12 | Disposition: A | Discharge status: Home / Self Care | Payer: Medicare Other | Attending: Emergency Medicine | Admitting: Emergency Medicine

## 2014-10-12 DIAGNOSIS — Z79899 Other long term (current) drug therapy: Secondary | ICD-10-CM | POA: Diagnosis not present

## 2014-10-12 DIAGNOSIS — E669 Obesity, unspecified: Secondary | ICD-10-CM | POA: Insufficient documentation

## 2014-10-12 DIAGNOSIS — R51 Headache: Secondary | ICD-10-CM | POA: Diagnosis not present

## 2014-10-12 DIAGNOSIS — Z7982 Long term (current) use of aspirin: Secondary | ICD-10-CM | POA: Diagnosis not present

## 2014-10-12 DIAGNOSIS — E785 Hyperlipidemia, unspecified: Secondary | ICD-10-CM | POA: Diagnosis not present

## 2014-10-12 DIAGNOSIS — E119 Type 2 diabetes mellitus without complications: Secondary | ICD-10-CM | POA: Diagnosis not present

## 2014-10-12 DIAGNOSIS — R200 Anesthesia of skin: Secondary | ICD-10-CM | POA: Insufficient documentation

## 2014-10-12 DIAGNOSIS — J449 Chronic obstructive pulmonary disease, unspecified: Secondary | ICD-10-CM | POA: Insufficient documentation

## 2014-10-12 DIAGNOSIS — Z720 Tobacco use: Secondary | ICD-10-CM | POA: Insufficient documentation

## 2014-10-12 DIAGNOSIS — I2510 Atherosclerotic heart disease of native coronary artery without angina pectoris: Secondary | ICD-10-CM | POA: Insufficient documentation

## 2014-10-12 DIAGNOSIS — I10 Essential (primary) hypertension: Secondary | ICD-10-CM | POA: Diagnosis not present

## 2014-10-12 DIAGNOSIS — Z8673 Personal history of transient ischemic attack (TIA), and cerebral infarction without residual deficits: Secondary | ICD-10-CM | POA: Insufficient documentation

## 2014-10-12 LAB — COMPREHENSIVE METABOLIC PANEL
ALBUMIN: 3.6 g/dL (ref 3.5–5.0)
ALT: 17 U/L (ref 14–54)
ANION GAP: 8 (ref 5–15)
AST: 14 U/L — ABNORMAL LOW (ref 15–41)
Alkaline Phosphatase: 72 U/L (ref 38–126)
BILIRUBIN TOTAL: 0.9 mg/dL (ref 0.3–1.2)
BUN: 12 mg/dL (ref 6–20)
CO2: 26 mmol/L (ref 22–32)
Calcium: 8.8 mg/dL — ABNORMAL LOW (ref 8.9–10.3)
Chloride: 102 mmol/L (ref 101–111)
Creatinine, Ser: 0.77 mg/dL (ref 0.44–1.00)
GFR calc non Af Amer: >60 mL/min (ref >60)
GLUCOSE: 197 mg/dL — AB (ref 65–99)
Potassium: 3.7 mmol/L (ref 3.5–5.1)
Sodium: 136 mmol/L (ref 135–145)
TOTAL PROTEIN: 6.2 g/dL — AB (ref 6.5–8.1)

## 2014-10-12 LAB — CBG MONITORING, ED: GLUCOSE-CAPILLARY: 174 mg/dL — AB (ref 65–99)

## 2014-10-12 LAB — DIFFERENTIAL
Basophils Absolute: 0 10*3/uL (ref 0.0–0.1)
Basophils Relative: 0 % (ref 0–1)
EOS ABS: 0.1 10*3/uL (ref 0.0–0.7)
EOS PCT: 2 % (ref 0–5)
Lymphocytes Relative: 26 % (ref 12–46)
Lymphs Abs: 1.8 10*3/uL (ref 0.7–4.0)
MONO ABS: 0.4 10*3/uL (ref 0.1–1.0)
Monocytes Relative: 6 % (ref 3–12)
NEUTROS PCT: 66 % (ref 43–77)
Neutro Abs: 4.8 10*3/uL (ref 1.7–7.7)

## 2014-10-12 LAB — PROTIME-INR
INR: 1.09 (ref 0.00–1.49)
PROTHROMBIN TIME: 14.3 s (ref 11.6–15.2)

## 2014-10-12 LAB — APTT: aPTT: 27 seconds (ref 24–37)

## 2014-10-12 LAB — CBC
HCT: 41.6 % (ref 36.0–46.0)
Hemoglobin: 14.7 g/dL (ref 12.0–15.0)
MCH: 32 pg (ref 26.0–34.0)
MCHC: 35.3 g/dL (ref 30.0–36.0)
MCV: 90.6 fL (ref 78.0–100.0)
Platelets: 162 10*3/uL (ref 150–400)
RBC: 4.59 MIL/uL (ref 3.87–5.11)
RDW: 13 % (ref 11.5–15.5)
WBC: 7.1 10*3/uL (ref 4.0–10.5)

## 2014-10-12 LAB — I-STAT TROPONIN, ED: Troponin i, poc: 0 ng/mL (ref 0.00–0.08)

## 2014-10-12 LAB — CK: Total CK: 55 U/L (ref 38–234)

## 2014-10-12 LAB — I-STAT CG4 LACTIC ACID, ED: Lactic Acid, Venous: 0.87 mmol/L (ref 0.5–2.0)

## 2014-10-12 NOTE — ED Notes (Signed)
Pt. Remains in MRI.

## 2014-10-12 NOTE — ED Provider Notes (Signed)
CSN: [id number]     Arrival date & time 10/12/14  1451 History   First MD Initiated Contact with Patient 10/12/14 1710     Chief Complaint  Patient presents with  . Numbness  . Headache     (Consider location/radiation/quality/duration/timing/severity/associated sxs/prior Treatment) HPI Comments: Patient presents to the emergency department with chief complaint of bilateral arm numbness when she woke this morning. She also had some difficulty with word finding.  Patient states that the symptoms have resolved. She denies any weakness. She has never experienced these symptoms before. She denies any chest pain or shortness of breath. There are no aggravating or alleviating factors.  The history is provided by the patient. No language interpreter was used.    Past Medical History  Diagnosis Date  . Diabetes mellitus   . Hypertension   . PAF with post-termination pauses     a. on dronedarone;  b. CHA2DS2VASc = 5 (HTN, DM, h/o lacunar infarct on MRI, Female) ->refused oral anticoagulation after h/o hematuria on pradaxa;  c. 02/2012 Echo: EF 55-60%, mildly dil LA. D. Recurrent PAF 10/2012 after only taking Multaq 1x/day - spont conv to NSR, placed back on BID Multaq/eliquis.  [name] COPD (chronic obstructive pulmonary disease)   . Obesity   . Chronic acquired lymphedema     a. R>L;  b. 03/2012 Neg LE U/S for DVT.  [name] Osteoarthritis   . Meniere disease   . CVD (cardiovascular disease)   . Hematuria     a. while on pradaxa,  she reports that she has seen Dr Margie Ege and had low risk cystoscopy  . Tobacco abuse   . Lacunar infarction     a. 02/2009 non-acute Lacunar infarct of the right thalamus noted on MRI of brain.  . Elevated TSH     a. 10/2012 - inst to f/u PCP.  . Numbness and tingling   . Stroke     hx of "mini strokes" 74yrs ago per pt  . Coronary artery disease   . Hyperlipidemia   . Atrial fibrillation   . Lymphedema   . Low back pain    Past Surgical History  Procedure Laterality Date   . Breast biopsy    . Biopsy of adrenal glands     Family History  Problem Relation Age of Onset  . Heart attack Father   . Coronary artery disease Father     strong family hx  . Leukemia Brother   . Cancer Mother     bladder   Social History  Substance Use Topics  . Smoking status: Current Every Day Smoker -- 1.00 packs/day for 46 years    Types: Cigarettes  . Smokeless tobacco: Never Used     Comment: not ready to quit  . Alcohol Use: No   OB History    No data available     Review of Systems  Constitutional: Negative for fever and chills.  Respiratory: Negative for shortness of breath.   Cardiovascular: Negative for chest pain.  Gastrointestinal: Negative for nausea, vomiting, diarrhea and constipation.  Genitourinary: Negative for dysuria.  Neurological: Positive for numbness.  All other systems reviewed and are negative.     Allergies  Statins; Sulfonamide derivatives; and Tape  Home Medications   Prior to Admission medications   Medication Sig Start Date End Date Taking? Authorizing Provider  aspirin 325 MG tablet Take 325 mg by mouth at bedtime.    Yes Historical Provider, MD  diazepam (VALIUM) 2 MG tablet Take 2  mg by mouth daily as needed for anxiety.  09/02/12  Yes Historical Provider, MD  dronedarone (MULTAQ) 400 MG tablet Take 1 tablet (400 mg total) by mouth 2 (two) times daily with a meal. Patient taking differently: Take 400 mg by mouth at bedtime.  11/11/12  Yes Dayna N Dunn, PA-C  furosemide (LASIX) 20 MG tablet Take 20 mg by mouth daily.   Yes Historical Provider, MD  glimepiride (AMARYL) 2 MG tablet Take 2 mg by mouth daily with breakfast.   Yes Historical Provider, MD  ibuprofen (ADVIL,MOTRIN) 800 MG tablet Take 800 mg by mouth daily as needed for headache or mild pain.    Yes Historical Provider, MD  metFORMIN (GLUCOPHAGE) 1000 MG tablet Take 1,000 mg by mouth at bedtime.    Yes Historical Provider, MD  Multiple Vitamin (MULTIVITAMIN) tablet Take  1 tablet by mouth daily.   Yes Historical Provider, MD  oxyCODONE-acetaminophen (ROXICET) 5-325 MG per tablet Take 1-2 tablets by mouth every 6 (six) hours as needed. Patient taking differently: Take 1-2 tablets by mouth every 6 (six) hours as needed for moderate pain.  07/17/14  Yes Orbie Pyo, MD  propranolol (INDERAL) 20 MG tablet Take 20 mg by mouth 2 (two) times daily as needed (for shaking).    Yes Historical Provider, MD  rosuvastatin (CRESTOR) 5 MG tablet Take 5 mg by mouth at bedtime.    Yes Historical Provider, MD  tiotropium (SPIRIVA) 18 MCG inhalation capsule Place 18 mcg into inhaler and inhale daily as needed (only when she feels short of breath).    Yes Historical Provider, MD  VALTREX 500 MG tablet Take 500 mg by mouth 2 (two) times daily as needed (for outbreaks).  02/19/13  Yes Historical Provider, MD  albuterol (PROVENTIL HFA;VENTOLIN HFA) 108 (90 BASE) MCG/ACT inhaler Inhale into the lungs every 6 (six) hours as needed for wheezing or shortness of breath.    Historical Provider, MD  CALCIUM PO Take 1,000 mg by mouth daily.    Historical Provider, MD  clindamycin (CLEOCIN) 150 MG capsule Take 3 capsules (450 mg total) by mouth 3 (three) times daily. Patient not taking: Reported on 10/12/2014 07/17/14   Orbie Pyo, MD  fexofenadine (ALLEGRA) 180 MG tablet Take 1 tablet (180 mg total) by mouth daily. Patient not taking: Reported on 10/12/2014 05/13/13   Dorothy Spark, MD  Fluticasone-Salmeterol (ADVAIR) 250-50 MCG/DOSE AEPB Inhale 1 puff into the lungs as needed (for wheezing or shortness of breath).  05/13/13   Dorothy Spark, MD  potassium chloride (K-DUR,KLOR-CON) 10 MEQ tablet Take 1 tablet (10 mEq total) by mouth daily. Patient not taking: Reported on 10/12/2014 08/18/13   Dorothy Spark, MD   BP 201/75 mmHg  Pulse 58  Temp(Src) 98.2 F (36.8 C) (Oral)  Resp 15  Ht 5\' 6"  (1.676 m)  Wt 210 lb (95.255 kg)  BMI 33.91 kg/m2  SpO2 100% Physical  Exam  Constitutional: She is oriented to person, place, and time. She appears well-developed and well-nourished.  HENT:  Head: Normocephalic and atraumatic.  Eyes: Conjunctivae and EOM are normal. Pupils are equal, round, and reactive to light.  Neck: Normal range of motion. Neck supple.  Cardiovascular: Normal rate and regular rhythm.  Exam reveals no gallop and no friction rub.   No murmur heard. Pulmonary/Chest: Effort normal and breath sounds normal. No respiratory distress. She has no wheezes. She has no rales. She exhibits no tenderness.  Abdominal: Soft. Bowel sounds are normal. She  exhibits no distension and no mass. There is no tenderness. There is no rebound and no guarding.  Musculoskeletal: Normal range of motion. She exhibits no edema or tenderness.  Neurological: She is alert and oriented to person, place, and time.  CN III-12 intact, normal sensation and strength throughout, speech is clear, movements are goal oriented  Skin: Skin is warm and dry.  Psychiatric: She has a normal mood and affect. Her behavior is normal. Judgment and thought content normal.  Nursing note and vitals reviewed.   ED Course  Procedures (including critical care time) Labs Review Labs Reviewed  COMPREHENSIVE METABOLIC PANEL - Abnormal; Notable for the following:    Glucose, Bld 197 (*)    Calcium 8.8 (*)    Total Protein 6.2 (*)    AST 14 (*)    All other components within normal limits  CBG MONITORING, ED - Abnormal; Notable for the following:    Glucose-Capillary 174 (*)    All other components within normal limits  PROTIME-INR  APTT  CBC  DIFFERENTIAL  CK  I-STAT TROPOININ, ED  I-STAT CHEM 8, ED  I-STAT CG4 LACTIC ACID, ED    Imaging Review Ct Head Wo Contrast  10/12/2014   CLINICAL DATA:  woke this morning with confusion and severe headache. Pt states that she also has bilateral arm numbness. Pt reports having difficulty "finding words". Pt reports hx of stroke and has  "skaking"HISTORY OF 2 MINI STROKES, HEADACHES TODAY  EXAM: CT HEAD WITHOUT CONTRAST  TECHNIQUE: Contiguous axial images were obtained from the base of the skull through the vertex without intravenous contrast.  COMPARISON:  11/10/2012  FINDINGS: Ventricles are normal in configuration. There is ventricular sulcal enlargement reflecting mild volume loss/atrophy. No hydrocephalus.  There are no parenchymal masses or mass effect. There is no evidence of a cortical infarct. Mild periventricular white matter hypoattenuation noted consistent with chronic microvascular ischemic change.  There are no extra-axial masses or abnormal fluid collections.  There is no intracranial hemorrhage. Visualized sinuses and mastoid air cells are essentially clear. No skull lesion.  IMPRESSION: 1. No acute intracranial abnormalities. 2. Mild atrophy and mild chronic microvascular ischemic change, stable from the prior CT.   Electronically Signed   By: Lajean Manes M.D.   On: 10/12/2014 17:36   Mr Brain Wo Contrast  10/12/2014   CLINICAL DATA:  Numbness and slurred speech.  Headache.  EXAM: MRI HEAD WITHOUT CONTRAST  MRI CERVICAL SPINE WITHOUT CONTRAST  TECHNIQUE: Multiplanar, multiecho pulse sequences of the brain and surrounding structures, and cervical spine, to include the craniocervical junction and cervicothoracic junction, were obtained without intravenous contrast.  COMPARISON:  CT head 10/12/2014  FINDINGS: MRI HEAD FINDINGS  Ventricle size normal. Cerebral volume normal. Pituitary normal in size  Negative for acute infarct. Mild chronic microvascular ischemic changes in the white matter. Small chronic infarct right thalamus. Mild chronic ischemia in the pons.  Negative for hemorrhage or mass lesion.  Mild mucosal edema right ethmoid sinus.  Normal orbit.  MRI CERVICAL SPINE FINDINGS  Moderate levoscoliosis in the cervical spine. Negative for fracture or mass lesion. Hemangioma at C2 vertebral body. Hyperintense signal in the  cord on the left at C5-6. This appears be related to myelomalacia from stenosis. There is a large osteophyte on the left at C5-6. Cervical medullary junction normal.  C2-3:  Mild degenerative change without stenosis  C3-4:  Mild degenerative change without stenosis  C4-5: Mild anterior slip. Disc and facet degeneration. Moderate right foraminal encroachment  due to uncinate spurring and facet hypertrophy. Mild spinal stenosis.  C5-6: Large left-sided osteophyte with left foraminal encroachment and cord flattening on the left. There is cord hyperintensity on the left. Moderate spinal stenosis. Mild right foraminal narrowing.  C6-7: Central disc and osteophyte complex flattening the cord causing moderate spinal stenosis. Mild foraminal narrowing bilaterally  C7-T1:  Small central disc protrusion with mild spinal stenosis.  IMPRESSION: No acute intracranial abnormality  Cervical scoliosis with multilevel degenerative change.  C4-5 mild spinal stenosis with moderate right foraminal encroachment  C5-6 moderate spinal stenosis. Large left-sided osteophyte with left foraminal encroachment and cord flattening on the left. Cord hyperintensity is present on the left due to spinal stenosis  Central disc and osteophyte at C6-7 causing moderate spinal stenosis  Small central disc protrusion C7-T1.   Electronically Signed   By: Franchot Gallo M.D.   On: 10/12/2014 20:33   Mr Cervical Spine Wo Contrast  10/12/2014   CLINICAL DATA:  Numbness and slurred speech.  Headache.  EXAM: MRI HEAD WITHOUT CONTRAST  MRI CERVICAL SPINE WITHOUT CONTRAST  TECHNIQUE: Multiplanar, multiecho pulse sequences of the brain and surrounding structures, and cervical spine, to include the craniocervical junction and cervicothoracic junction, were obtained without intravenous contrast.  COMPARISON:  CT head 10/12/2014  FINDINGS: MRI HEAD FINDINGS  Ventricle size normal. Cerebral volume normal. Pituitary normal in size  Negative for acute infarct. Mild  chronic microvascular ischemic changes in the white matter. Small chronic infarct right thalamus. Mild chronic ischemia in the pons.  Negative for hemorrhage or mass lesion.  Mild mucosal edema right ethmoid sinus.  Normal orbit.  MRI CERVICAL SPINE FINDINGS  Moderate levoscoliosis in the cervical spine. Negative for fracture or mass lesion. Hemangioma at C2 vertebral body. Hyperintense signal in the cord on the left at C5-6. This appears be related to myelomalacia from stenosis. There is a large osteophyte on the left at C5-6. Cervical medullary junction normal.  C2-3:  Mild degenerative change without stenosis  C3-4:  Mild degenerative change without stenosis  C4-5: Mild anterior slip. Disc and facet degeneration. Moderate right foraminal encroachment due to uncinate spurring and facet hypertrophy. Mild spinal stenosis.  C5-6: Large left-sided osteophyte with left foraminal encroachment and cord flattening on the left. There is cord hyperintensity on the left. Moderate spinal stenosis. Mild right foraminal narrowing.  C6-7: Central disc and osteophyte complex flattening the cord causing moderate spinal stenosis. Mild foraminal narrowing bilaterally  C7-T1:  Small central disc protrusion with mild spinal stenosis.  IMPRESSION: No acute intracranial abnormality  Cervical scoliosis with multilevel degenerative change.  C4-5 mild spinal stenosis with moderate right foraminal encroachment  C5-6 moderate spinal stenosis. Large left-sided osteophyte with left foraminal encroachment and cord flattening on the left. Cord hyperintensity is present on the left due to spinal stenosis  Central disc and osteophyte at C6-7 causing moderate spinal stenosis  Small central disc protrusion C7-T1.   Electronically Signed   By: Franchot Gallo M.D.   On: 10/12/2014 20:33   I have personally reviewed and evaluated these images and lab results as part of my medical decision-making.   EKG Interpretation   Date/Time:  Tuesday October 12 2014 15:14:53 EDT Ventricular Rate:  50 PR Interval:  124 QRS Duration: 94 QT Interval:  458 QTC Calculation: 417 R Axis:   -18 Text Interpretation:  Sinus bradycardia Septal infarct , age undetermined  Possible Lateral infarct , age undetermined When compared with ECG of  11/10/2012 No significant change was  found Confirmed by [facility]  MD,  Nunzio Cory [phone]) on 10/12/2014 5:47:24 PM      MDM   Final diagnoses:  Numbness   Patient with a chief complaint of numbness and brief episode of having difficulty finding words this morning. Will check head CT and labs. Will reassess. Patient is currently symptom-free.  Patient concerned that her symptoms are related to taking rosuvastatin.  She associates her symptoms with when she started the rosuvastatin.  States that she never had a problem when she was taking brand Crestor.  CT is negative.  Labs are reassuring.  Patient discussed with Dr. Thurnell Garbe, who recommends MRI brain and c-spine.  Plan for discharge to home if MRIs are normal.  MRA brain is negative, MRI of C-spine shows some spinal stenosis, which could be causing her numbness sensation.  I discussed the patient with Dr. Kathyrn Sheriff, who will see the patient in his office in 2 days, but does not recommend emergency surgery tonight as the patient is neurovascularly intact.    Montine Circle, PA-C 10/12/14 [place], DO 10/15/14 1351

## 2014-10-12 NOTE — ED Notes (Addendum)
Patient returned to room via stretcher from MRI

## 2014-10-12 NOTE — Discharge Instructions (Signed)
Spinal Stenosis Spinal stenosis is an abnormal narrowing of the canals of your spine (vertebrae). CAUSES  Spinal stenosis is caused by areas of bone pushing into the central canals of your vertebrae. This condition can be present at birth (congenital). It also may be caused by arthritic deterioration of your vertebrae (spinal degeneration).  SYMPTOMS   Pain that is generally worse with activities, particularly standing and walking.  Numbness, tingling, hot or cold sensations, weakness, or weariness in your legs.  Frequent episodes of falling.  A foot-slapping gait that leads to muscle weakness. DIAGNOSIS  Spinal stenosis is diagnosed with the use of magnetic resonance imaging (MRI) or computed tomography (CT). TREATMENT  Initial therapy for spinal stenosis focuses on the management of the pain and other symptoms associated with the condition. These therapies include:  Practicing postural changes to lessen pressure on your nerves.  Exercises to strengthen the core of your body.  Loss of excess body weight.  The use of nonsteroidal anti-inflammatory medicines to reduce swelling and inflammation in your nerves. When therapies to manage pain are not successful, surgery to treat spinal stenosis may be recommended. This surgery involves removing excess bone, which puts pressure on your nerve roots. During this surgery (laminectomy), the posterior boney arch (lamina) and excess bone around the facet joints are removed. Document Released: 04/28/2003 Document Revised: 06/22/2013 Document Reviewed: 05/16/2012 [employer] Patient Information 2015 [employer]. This information is not intended to replace advice given to you by your health care provider. Make sure you discuss any questions you have with your health care provider.  

## 2014-10-12 NOTE — ED Notes (Signed)
Pt reports that she woke this morning with confusion and severe headache. Pt states that she also has bilateral arm numbness. Pt reports having difficulty "finding words". Pt reports hx of stroke and has "skaking" since then. Pt also reports taking new medications recently

## 2014-10-12 NOTE — ED Notes (Signed)
Discharge instructions reviewed with patient/spouse. Understanding verbalized. Denies pain. Refused wheelchair at discharge. No distress noted.

## 2014-10-27 ENCOUNTER — Telehealth: Payer: Self-pay | Admitting: Internal Medicine

## 2014-10-27 NOTE — Telephone Encounter (Signed)
New message      Request for surgical clearance:  1. What type of surgery is being performed? Neck surgery  2. When is this surgery scheduled?  Pending clearance  Are there any medications that need to be held prior to surgery and how long? Pending clearance----not sure of medications to be held 3. Name of physician performing surgery? Dr Frances Maywood  4. What is your office phone and fax number?  Fax [phone] 5. Clearance faxed to our office on 10-20-14

## 2014-11-03 ENCOUNTER — Ambulatory Visit: Payer: Medicare Other | Admitting: Physician Assistant

## 2014-11-03 NOTE — Telephone Encounter (Signed)
I have not seen since 2014 and cannot make preoperative recommendations.  Please schedule with general cardiology PA/NP for assessment.

## 2014-11-04 NOTE — Telephone Encounter (Signed)
Contacted the pt to inform her that we have her scheduled to come in on 9/27 at 1:15 pm with Amie Portland PA-C for assessment and clearance for her pending neck surgery.  Pt verbalized understanding, agrees with this plan, and gracious for all the assistance with getting her cleared for her neck surgery.  Also notified Manuela Schwartz, the scheduler at Dr Frances Maywood office that the pt will need to be seen on 9/27 for cardiac clearance work-up before we can clear her for neck surgery.  Manuela Schwartz verbalized understanding and agrees with plan.

## 2014-11-04 NOTE — Telephone Encounter (Signed)
She needs to be scheduled with PA/NP as she hasn't been see in over 1 year

## 2014-11-05 ENCOUNTER — Telehealth: Payer: Self-pay | Admitting: Internal Medicine

## 2014-11-05 MED ORDER — DILTIAZEM HCL ER COATED BEADS 120 MG PO CP24: 120.0000 mg | ORAL_CAPSULE | Freq: Every day | ORAL | Status: DC

## 2014-11-05 NOTE — Telephone Encounter (Signed)
Went and spoke with Dr Rayann Heman DOD,  who is also the pts EP MD, and per Dr Rayann Heman we can start the pt on long acting Diltiazem 120 mg po daily.  Informed the pt to start taking her lasix as scheduled daily and per Dr Rayann Heman he would like for her to start taking long acting Diltiazem 120 mg po daily for chronic afib. Advised the pt to continue monitoring her BP and HR and if she needs [place] between now and her scheduled OV appt with Estella Husk PA on 9/27, to call the office and ask to speak with a triage nurse.  Confirmed the pharmacy of choice with the pt.  Pt verbalized understanding and agrees with this plan.

## 2014-11-05 NOTE — Telephone Encounter (Signed)
New message      Pt c/o Shortness Of Breath: STAT if SOB developed within the last 24 hours or pt is noticeably SOB on the phone  1. Are you currently SOB (can you hear that pt is SOB on the phone)? Yes  2. How long have you been experiencing SOB? 3 days  3. Are you SOB when sitting or when up moving around? Both  4. Are you currently experiencing any other symptoms? Pt states she is in Afib and feeling nervous

## 2014-11-05 NOTE — Telephone Encounter (Signed)
Contacted the pt back and spoke with her friend who states she is a "cardiac nurse," and she states she obtained the pts HR and BP and values were 116/67 HR- 60 irregular.  Pt and friend state she took her prn diltiazem about 45 mins ago, and is starting to feel a little better.  Reiterated to the pt and friend that the pt should be compliant with taking her lasix as scheduled at 20 mg po daily, vs taking this only as needed.  Informed the pt and friend that I will go and speak with Dr Rayann Heman with the pts complaints and current vitals and follow-up with any recommendations he has thereafter.  Both verbalized understanding and agrees with this plan. Pt is scheduled for her surgical clearance follow-up appt to receive her neck surgery for 9/27 with Estella Husk, at our office. we

## 2014-11-05 NOTE — Telephone Encounter (Signed)
I spoke with the pt and she complains of SOB for the past few days.  The pt said she has lymphedema swelling all the time but not worse than her normal. She does not weigh on a daily basis.  The pt has missed her lasix dose for 2 days but she did take it today and she has mainly been using this medication as needed. The pt feels like her atrial fibrillation is causing SOB.  The pt does have a Rx for Diltiazem 30mg  to use as needed for palpitations and she took this yesterday and felt better.  The pt woke up this morning and did not feel well and she went ahead and took a Diltiazem and is starting to feel better.  The pt's friend is a Marine scientist and she is on her way to her home to check her BP and pulse.  I made the pt aware that we will call her back shortly to give further recommendations.

## 2014-11-08 ENCOUNTER — Telehealth: Payer: Self-pay | Admitting: Cardiology

## 2014-11-08 NOTE — Telephone Encounter (Signed)
Pt states she was at pharmacy earlier and her heart rate was 117, BP ?111/60. Pt states her friend just checked her heart rate and it was 74.  Pt states she has been having tachycardia and shortness of breath for about 2 weeks. Pt states she started Diltiazem CD 120mg  daily on Saturday per Dr Rayann Heman. Pt states Diltiazem helps for about 2 hours then she feels bad again. I reviewed with Dr Rayann Heman- Per Dr Sherene Sires visit tomorrow, ED tonight if symptoms change.   Pt advised, verbalized understanding- she has been scheduled to see Richardson Dopp, PA,c tomorrow at 2:30PM, pt offered earlier appt in the day but she declined.

## 2014-11-08 NOTE — Telephone Encounter (Signed)
New message     Pt states heart rate is 117 sitting still  Pt states SOB  Pt c/o Shortness Of Breath: STAT if SOB developed within the last 24 hours or pt is noticeably SOB on the phone  1. Are you currently SOB (can you hear that pt is SOB on the phone)?Yes   2. How long have you been experiencing SOB? 2 weeks  3. Are you SOB when sitting or when up moving around? Both  4. Are you currently experiencing any other symptoms? Heart rate is 117 sitting

## 2014-11-09 ENCOUNTER — Inpatient Hospital Stay (HOSPITAL_COMMUNITY): Payer: Medicare Other

## 2014-11-09 ENCOUNTER — Ambulatory Visit (INDEPENDENT_AMBULATORY_CARE_PROVIDER_SITE_OTHER): Payer: Medicare Other | Admitting: Physician Assistant

## 2014-11-09 ENCOUNTER — Inpatient Hospital Stay (HOSPITAL_COMMUNITY)
Admission: AD | Admit: 2014-11-09 | Discharge: 2014-11-14 | DRG: 310 | Disposition: A | Discharge status: Home / Self Care | Payer: Medicare Other | Source: Ambulatory Visit | Attending: Internal Medicine | Admitting: Internal Medicine

## 2014-11-09 ENCOUNTER — Encounter (HOSPITAL_COMMUNITY): Payer: Self-pay | Admitting: *Deleted

## 2014-11-09 ENCOUNTER — Encounter: Payer: Self-pay | Admitting: Physician Assistant

## 2014-11-09 VITALS — BP 102/58 | HR 133 | Ht 67.0 in | Wt 214.0 lb

## 2014-11-09 DIAGNOSIS — I481 Persistent atrial fibrillation: Principal | ICD-10-CM | POA: Diagnosis present

## 2014-11-09 DIAGNOSIS — I890 Lymphedema, not elsewhere classified: Secondary | ICD-10-CM | POA: Diagnosis present

## 2014-11-09 DIAGNOSIS — Z882 Allergy status to sulfonamides status: Secondary | ICD-10-CM

## 2014-11-09 DIAGNOSIS — Z6833 Body mass index (BMI) 33.0-33.9, adult: Secondary | ICD-10-CM | POA: Diagnosis not present

## 2014-11-09 DIAGNOSIS — I4891 Unspecified atrial fibrillation: Secondary | ICD-10-CM | POA: Diagnosis present

## 2014-11-09 DIAGNOSIS — I10 Essential (primary) hypertension: Secondary | ICD-10-CM

## 2014-11-09 DIAGNOSIS — I429 Cardiomyopathy, unspecified: Secondary | ICD-10-CM | POA: Diagnosis present

## 2014-11-09 DIAGNOSIS — E119 Type 2 diabetes mellitus without complications: Secondary | ICD-10-CM

## 2014-11-09 DIAGNOSIS — Z79899 Other long term (current) drug therapy: Secondary | ICD-10-CM | POA: Diagnosis not present

## 2014-11-09 DIAGNOSIS — M1990 Unspecified osteoarthritis, unspecified site: Secondary | ICD-10-CM | POA: Diagnosis present

## 2014-11-09 DIAGNOSIS — I2510 Atherosclerotic heart disease of native coronary artery without angina pectoris: Secondary | ICD-10-CM

## 2014-11-09 DIAGNOSIS — I252 Old myocardial infarction: Secondary | ICD-10-CM

## 2014-11-09 DIAGNOSIS — H8109 Meniere's disease, unspecified ear: Secondary | ICD-10-CM | POA: Diagnosis present

## 2014-11-09 DIAGNOSIS — Z91048 Other nonmedicinal substance allergy status: Secondary | ICD-10-CM | POA: Diagnosis not present

## 2014-11-09 DIAGNOSIS — Z7982 Long term (current) use of aspirin: Secondary | ICD-10-CM | POA: Diagnosis not present

## 2014-11-09 DIAGNOSIS — Z8673 Personal history of transient ischemic attack (TIA), and cerebral infarction without residual deficits: Secondary | ICD-10-CM | POA: Diagnosis not present

## 2014-11-09 DIAGNOSIS — F17210 Nicotine dependence, cigarettes, uncomplicated: Secondary | ICD-10-CM | POA: Diagnosis present

## 2014-11-09 DIAGNOSIS — Z7901 Long term (current) use of anticoagulants: Secondary | ICD-10-CM | POA: Diagnosis not present

## 2014-11-09 DIAGNOSIS — E785 Hyperlipidemia, unspecified: Secondary | ICD-10-CM | POA: Diagnosis present

## 2014-11-09 DIAGNOSIS — F17200 Nicotine dependence, unspecified, uncomplicated: Secondary | ICD-10-CM | POA: Diagnosis present

## 2014-11-09 DIAGNOSIS — J449 Chronic obstructive pulmonary disease, unspecified: Secondary | ICD-10-CM | POA: Diagnosis present

## 2014-11-09 DIAGNOSIS — I340 Nonrheumatic mitral (valve) insufficiency: Secondary | ICD-10-CM | POA: Diagnosis not present

## 2014-11-09 DIAGNOSIS — Z9119 Patient's noncompliance with other medical treatment and regimen: Secondary | ICD-10-CM | POA: Diagnosis present

## 2014-11-09 DIAGNOSIS — Z888 Allergy status to other drugs, medicaments and biological substances status: Secondary | ICD-10-CM

## 2014-11-09 DIAGNOSIS — E669 Obesity, unspecified: Secondary | ICD-10-CM | POA: Diagnosis present

## 2014-11-09 DIAGNOSIS — E1169 Type 2 diabetes mellitus with other specified complication: Secondary | ICD-10-CM

## 2014-11-09 DIAGNOSIS — E663 Overweight: Secondary | ICD-10-CM | POA: Diagnosis present

## 2014-11-09 DIAGNOSIS — I480 Paroxysmal atrial fibrillation: Secondary | ICD-10-CM | POA: Diagnosis present

## 2014-11-09 DIAGNOSIS — R0602 Shortness of breath: Secondary | ICD-10-CM

## 2014-11-09 DIAGNOSIS — Z955 Presence of coronary angioplasty implant and graft: Secondary | ICD-10-CM

## 2014-11-09 DIAGNOSIS — Z720 Tobacco use: Secondary | ICD-10-CM

## 2014-11-09 LAB — CBC WITH DIFFERENTIAL/PLATELET
BASOS ABS: 0 10*3/uL (ref 0.0–0.1)
Basophils Relative: 0 %
EOS ABS: 0.1 10*3/uL (ref 0.0–0.7)
EOS PCT: 2 %
HCT: 40.3 % (ref 36.0–46.0)
Hemoglobin: 14 g/dL (ref 12.0–15.0)
Lymphocytes Relative: 31 %
Lymphs Abs: 2.2 10*3/uL (ref 0.7–4.0)
MCH: 31.8 pg (ref 26.0–34.0)
MCHC: 34.7 g/dL (ref 30.0–36.0)
MCV: 91.6 fL (ref 78.0–100.0)
Monocytes Absolute: 0.4 10*3/uL (ref 0.1–1.0)
Monocytes Relative: 5 %
NEUTROS PCT: 62 %
Neutro Abs: 4.3 10*3/uL (ref 1.7–7.7)
PLATELETS: 180 10*3/uL (ref 150–400)
RBC: 4.4 MIL/uL (ref 3.87–5.11)
RDW: 13.6 % (ref 11.5–15.5)
WBC: 6.9 10*3/uL (ref 4.0–10.5)

## 2014-11-09 LAB — COMPREHENSIVE METABOLIC PANEL
ALK PHOS: 84 U/L (ref 38–126)
ALT: 47 U/L (ref 14–54)
ANION GAP: 9 (ref 5–15)
AST: 44 U/L — ABNORMAL HIGH (ref 15–41)
Albumin: 3.1 g/dL — ABNORMAL LOW (ref 3.5–5.0)
BILIRUBIN TOTAL: 0.7 mg/dL (ref 0.3–1.2)
BUN: 12 mg/dL (ref 6–20)
CALCIUM: 8.8 mg/dL — AB (ref 8.9–10.3)
CO2: 26 mmol/L (ref 22–32)
Chloride: 105 mmol/L (ref 101–111)
Creatinine, Ser: 0.86 mg/dL (ref 0.44–1.00)
GFR calc non Af Amer: >60 mL/min (ref >60)
Glucose, Bld: 238 mg/dL — ABNORMAL HIGH (ref 65–99)
POTASSIUM: 3.9 mmol/L (ref 3.5–5.1)
SODIUM: 140 mmol/L (ref 135–145)
TOTAL PROTEIN: 5.8 g/dL — AB (ref 6.5–8.1)

## 2014-11-09 LAB — TROPONIN I

## 2014-11-09 LAB — GLUCOSE, CAPILLARY: GLUCOSE-CAPILLARY: 197 mg/dL — AB (ref 65–99)

## 2014-11-09 LAB — TSH: TSH: 2.619 u[IU]/mL (ref 0.350–4.500)

## 2014-11-09 LAB — MAGNESIUM: MAGNESIUM: 1.9 mg/dL (ref 1.7–2.4)

## 2014-11-09 MED ORDER — TIOTROPIUM BROMIDE MONOHYDRATE 18 MCG IN CAPS
18.0000 ug | ORAL_CAPSULE | Freq: Every day | RESPIRATORY_TRACT | Status: DC | PRN
  Administered 2014-11-10: 18 ug via RESPIRATORY_TRACT
  Filled 2014-11-09 (×2): qty 5

## 2014-11-09 MED ORDER — DILTIAZEM HCL 100 MG IV SOLR: 5.0000 mg/h | INTRAVENOUS | Status: DC

## 2014-11-09 MED ORDER — FUROSEMIDE 20 MG PO TABS
20.0000 mg | ORAL_TABLET | Freq: Every day | ORAL | Status: DC
  Administered 2014-11-10 – 2014-11-14 (×5): 20 mg via ORAL
  Filled 2014-11-09 (×6): qty 1

## 2014-11-09 MED ORDER — ONDANSETRON HCL 4 MG/2ML IJ SOLN: 4.0000 mg | Freq: Four times a day (QID) | INTRAMUSCULAR | Status: DC | PRN

## 2014-11-09 MED ORDER — VALACYCLOVIR HCL 500 MG PO TABS
500.0000 mg | ORAL_TABLET | Freq: Two times a day (BID) | ORAL | Status: DC | PRN
  Filled 2014-11-09: qty 1

## 2014-11-09 MED ORDER — APIXABAN 5 MG PO TABS
5.0000 mg | ORAL_TABLET | Freq: Two times a day (BID) | ORAL | Status: DC
  Administered 2014-11-09 – 2014-11-14 (×10): 5 mg via ORAL
  Filled 2014-11-09 (×10): qty 1

## 2014-11-09 MED ORDER — METFORMIN HCL 500 MG PO TABS: 1000.0000 mg | ORAL_TABLET | Freq: Every day | ORAL | Status: DC

## 2014-11-09 MED ORDER — SODIUM CHLORIDE 0.9 % IJ SOLN: 3.0000 mL | INTRAMUSCULAR | Status: DC | PRN

## 2014-11-09 MED ORDER — SODIUM CHLORIDE 0.9 % IJ SOLN
3.0000 mL | Freq: Two times a day (BID) | INTRAMUSCULAR | Status: DC
  Administered 2014-11-09 – 2014-11-11 (×3): 3 mL via INTRAVENOUS

## 2014-11-09 MED ORDER — INSULIN ASPART 100 UNIT/ML ~~LOC~~ SOLN
0.0000 [IU] | Freq: Three times a day (TID) | SUBCUTANEOUS | Status: DC
Start: 2014-11-09 — End: 2014-11-14
  Administered 2014-11-10: 2 [IU] via SUBCUTANEOUS
  Administered 2014-11-10 – 2014-11-11 (×3): 3 [IU] via SUBCUTANEOUS
  Administered 2014-11-12: 1 [IU] via SUBCUTANEOUS
  Administered 2014-11-12: 2 [IU] via SUBCUTANEOUS
  Administered 2014-11-13: 1 [IU] via SUBCUTANEOUS
  Administered 2014-11-13 (×2): 2 [IU] via SUBCUTANEOUS
  Administered 2014-11-14: 3 [IU] via SUBCUTANEOUS
  Administered 2014-11-14: 2 [IU] via SUBCUTANEOUS

## 2014-11-09 MED ORDER — DEXTROSE 5 % IV SOLN
15.0000 mg/h | INTRAVENOUS | Status: DC
  Administered 2014-11-09: 10 mg/h via INTRAVENOUS
  Administered 2014-11-10 – 2014-11-11 (×5): 15 mg/h via INTRAVENOUS
  Filled 2014-11-09 (×6): qty 100

## 2014-11-09 MED ORDER — METFORMIN HCL 500 MG PO TABS
1000.0000 mg | ORAL_TABLET | Freq: Every day | ORAL | Status: DC
  Administered 2014-11-09 – 2014-11-13 (×5): 1000 mg via ORAL
  Filled 2014-11-09 (×5): qty 2

## 2014-11-09 MED ORDER — DIAZEPAM 2 MG PO TABS
2.0000 mg | ORAL_TABLET | Freq: Every day | ORAL | Status: DC | PRN
  Administered 2014-11-10 (×2): 2 mg via ORAL
  Filled 2014-11-09 (×2): qty 1

## 2014-11-09 MED ORDER — GLIMEPIRIDE 2 MG PO TABS: 2.0000 mg | ORAL_TABLET | Freq: Every day | ORAL | Status: DC

## 2014-11-09 MED ORDER — ALBUTEROL SULFATE (2.5 MG/3ML) 0.083% IN NEBU
3.0000 mL | INHALATION_SOLUTION | Freq: Four times a day (QID) | RESPIRATORY_TRACT | Status: DC | PRN
  Administered 2014-11-10: 3 mL via RESPIRATORY_TRACT
  Filled 2014-11-09: qty 3

## 2014-11-09 MED ORDER — ROSUVASTATIN CALCIUM 10 MG PO TABS
5.0000 mg | ORAL_TABLET | Freq: Every day | ORAL | Status: DC
  Administered 2014-11-09 – 2014-11-13 (×5): 5 mg via ORAL
  Filled 2014-11-09 (×5): qty 1

## 2014-11-09 MED ORDER — ONE-DAILY MULTI VITAMINS PO TABS: 1.0000 | ORAL_TABLET | Freq: Every day | ORAL | Status: DC

## 2014-11-09 MED ORDER — ACETAMINOPHEN 325 MG PO TABS
650.0000 mg | ORAL_TABLET | ORAL | Status: DC | PRN
  Administered 2014-11-10 – 2014-11-13 (×2): 650 mg via ORAL
  Filled 2014-11-09 (×2): qty 2

## 2014-11-09 MED ORDER — ADULT MULTIVITAMIN W/MINERALS CH
1.0000 | ORAL_TABLET | Freq: Every day | ORAL | Status: DC
  Administered 2014-11-10 – 2014-11-14 (×5): 1 via ORAL
  Filled 2014-11-09 (×5): qty 1

## 2014-11-09 MED ORDER — GLIMEPIRIDE 4 MG PO TABS
4.0000 mg | ORAL_TABLET | Freq: Every day | ORAL | Status: DC
  Administered 2014-11-10 – 2014-11-14 (×3): 4 mg via ORAL
  Filled 2014-11-09 (×6): qty 1

## 2014-11-09 MED ORDER — SODIUM CHLORIDE 0.9 % IV SOLN: 250.0000 mL | INTRAVENOUS | Status: DC | PRN

## 2014-11-09 NOTE — Patient Instructions (Signed)
YOU HAVE BEEN ADMITTED TO [place] TO [facility].Marland KitchenMarland Kitchen

## 2014-11-09 NOTE — Progress Notes (Signed)
Admission History and Physical   Date:  11/09/2014   ID:  Melanie Lucas, DOB Aug 05, 1950, MRN [id number]  PCP:  Tawanna Solo, MD  Cardiologist:  Dr. Ena Dawley   Electrophysiologist:  Dr. Thompson Grayer   Chief Complaint  Patient presents with  . Palpitations    Tachycardia  . Shortness of Breath     History of Present Illness: Melanie Lucas is a 64 y.o. female with a hx of PAF controlled on Multaq, CAD, HTN, DM2, COPD, lymphedema, prior stroke.  She stopped anticoagulation in 10/14 b/c of hematuria while taking Pradaxa.  She had an MI in 02/2013 in [place] and underwent PCI with an Integrity stent to the D1. Last seen by Dr. Ena Dawley 07/2013.  She has remained reluctant to resume anticoagulation.    She called in recently with rapid palpitations and shortness of breath.  She was started on Diltizem and schedule for follow up today.    She is here today with 2 friends. She has not felt well for at least 2 weeks.  She is fatigued and notes DOE with minimal activity.  She is NYHA 3.  She denies orthopnea, PND.  She has chronic LE edema.  This is slightly worse.  She has been coughing and wheezing.  She denies syncope but has been dizzy.  She denies chest pain.     Studies/Reports Reviewed Today:  Echo 05/29/13 ?apical HK, EF 55%, Gr 2 DD, mod LAE, atrial septal lipomatous hypertrophy, PASP 47 mmHg  LHC 02/27/13 ([place]) D1 95% >> integrity DES 2.5 x 26 mm    Past Medical History  Diagnosis Date  . Diabetes mellitus   . Hypertension   . PAF with post-termination pauses     a. on dronedarone;  b. CHA2DS2VASc = 5 (HTN, DM, h/o lacunar infarct on MRI, Female) ->refused oral anticoagulation after h/o hematuria on pradaxa;  c. 02/2012 Echo: EF 55-60%, mildly dil LA. D. Recurrent PAF 10/2012 after only taking Multaq 1x/day - spont conv to NSR, placed back on BID Multaq/eliquis.  [name] COPD (chronic obstructive pulmonary disease)   . Obesity   . Chronic acquired lymphedema      a. R>L;  b. 03/2012 Neg LE U/S for DVT.  [name] Osteoarthritis   . Meniere disease   . CVD (cardiovascular disease)   . Hematuria     a. while on pradaxa,  she reports that she has seen Dr Margie Ege and had low risk cystoscopy  . Tobacco abuse   . Lacunar infarction     a. 02/2009 non-acute Lacunar infarct of the right thalamus noted on MRI of brain.  . Elevated TSH     a. 10/2012 - inst to f/u PCP.  . Numbness and tingling   . Stroke     hx of "mini strokes" 50yrs ago per pt  . Coronary artery disease   . Hyperlipidemia   . Atrial fibrillation   . Lymphedema   . Low back pain     Past Surgical History  Procedure Laterality Date  . Breast biopsy    . Biopsy of adrenal glands       No current outpatient prescriptions on file.   No current facility-administered medications for this visit.    Allergies:   Statins; Sulfonamide derivatives; and Tape    Social History:  The patient  reports that she has been smoking Cigarettes.  She has a 46 pack-year smoking history. She has never used smokeless tobacco. She  reports that she does not drink alcohol or use illicit drugs.   Family History:  The patient's family history includes Cancer in her mother; Coronary artery disease in her father; Heart attack in her father; Leukemia in her brother.    ROS:   Please see the history of present illness.   Review of Systems  Constitution: Positive for chills and malaise/fatigue.  Eyes: Positive for visual disturbance.  Cardiovascular: Positive for chest pain, dyspnea on exertion and irregular heartbeat.  Respiratory: Positive for shortness of breath.   Musculoskeletal: Positive for back pain and myalgias.  Neurological: Positive for dizziness and loss of balance.  Psychiatric/Behavioral: The patient is nervous/anxious.   All other systems reviewed and are negative.     PHYSICAL EXAM: VS:  BP 102/58 mmHg  Pulse 133  Ht 5\' 7"  (1.702 m)  Wt 214 lb (97.07 kg)  BMI 33.51 kg/m2  SpO2 98%     Wt Readings from Last 3 Encounters:  11/09/14 214 lb (97.07 kg)  10/12/14 210 lb (95.255 kg)  07/16/14 210 lb (95.255 kg)     GEN: Well nourished, well developed, in no acute distress HEENT: normal Neck: no JVD,   no masses Cardiac:  Normal S1/S2, irreg irreg rhythm; no murmur ,  no rubs or gallops, 1-2+ bilateral LE and massive pedal edema  Respiratory:  clear to auscultation bilaterally, no wheezing, rhonchi or rales. GI: soft, nontender, nondistended, + BS MS: no deformity or atrophy Skin: warm and dry  Neuro:  CNs II-XII intact, Strength and sensation are intact Psych: Normal affect   EKG:  EKG is ordered today.  It demonstrates:   AFib, HR 133, NSSTTW changes   Recent Labs: 10/12/2014: ALT 17; BUN 12; Creatinine, Ser 0.77; Hemoglobin 14.7; Platelets 162; Potassium 3.7; Sodium 136    Lipid Panel    Component Value Date/Time   CHOL 115 11/11/2012 0605   TRIG 112 11/11/2012 0605   HDL 44 11/11/2012 0605   CHOLHDL 2.6 11/11/2012 0605   VLDL 22 11/11/2012 0605   LDLCALC 49 11/11/2012 0605      ASSESSMENT AND PLAN:  1. Paroxysmal Atrial Fibrillation:  She presents with AF with RVR.  She is quite symptomatic. BP is soft and I doubt she could tolerate further titration of her beta-blocker.  She is not anticoagulated due to a hx of hematuria on Pradaxa.  She also tells me that she had issues with Coumadin. She has had no bleeding issues on ASA.  I reviewed her case with Dr. Cristopher Peru (DOD).  We reviewed rate vs rhythm control.  I think we should pursue rhythm control.  Prior ECGs while in NSR demonstrate QTc 386 ms.  Therefore, she is a candidate for Tikosyn.  CHADS2-VASc=5.   -  Admit to Tele.  -  DC Multaq  -  Start Eliquis 5 mg bid.   -  Transition Cardizem to Diltiazem gtt for rate control at 10 mg/hr  -  Plan TEE after 3-5 doses of Eliquis >> if no LAA clot >> start Tikosyn  -  Consider DCCV 3 days after starting Tikosyn if remains in AFib.  -  Hopefully, she will  be able to tolerate [place] w/o bleeding issues. We briefly discussed data from AVERROES trial.  2. CAD:  S/p MI in [place] in 2015 tx with stent to D1.  No angina.  Cycle CEs.  Obtain Echo.  3. HTN:  Controlled.   4. Diabetes Mellitus:  Continue current regimen.   5.  Hyperlipidemia:  Continue statin.   6. COPD:  Continue Spiriva.  7. Tobacco Abuse:  We discussed the importance of quitting smoking.     Medication Changes: Current medicines are reviewed at length with the patient today.  Concerns regarding medicines are as outlined above.  The following changes have been made:   Discontinued Medications   CLINDAMYCIN (CLEOCIN) 150 MG CAPSULE    Take 3 capsules (450 mg total) by mouth 3 (three) times daily.   FEXOFENADINE (ALLEGRA) 180 MG TABLET    Take 1 tablet (180 mg total) by mouth daily.   FLUTICASONE-SALMETEROL (ADVAIR) 250-50 MCG/DOSE AEPB    Inhale 1 puff into the lungs as needed (for wheezing or shortness of breath).    OXYCODONE-ACETAMINOPHEN (ROXICET) 5-325 MG PER TABLET    Take 1-2 tablets by mouth every 6 (six) hours as needed.   POTASSIUM CHLORIDE (K-DUR,KLOR-CON) 10 MEQ TABLET    Take 1 tablet (10 mEq total) by mouth daily.   Modified Medications   No medications on file   New Prescriptions   No medications on file   Labs/ tests ordered today include:   Orders Placed This Encounter  Procedures  . EKG 12-Lead     Disposition:    Admit to [employer] [facility] today.     Signed, Versie Starks, MHS 11/09/2014 5:20 PM    [facility] [address], [place], [place]  [address] Phone: [phone]; Fax: [phone]    Cardiology  Attending  Patient seen and examined. Agree with the findings as noted above. The patient's exam as documented represents mine as well. She has recurrent atrial fib with an RVR and no additional blood pressure for outpatient uptitration of rate controlling meds. I have recommended admit and cardioversion, with TEE  guidance.   Mikle Bosworth.D.

## 2014-11-10 DIAGNOSIS — I4891 Unspecified atrial fibrillation: Secondary | ICD-10-CM

## 2014-11-10 LAB — GLUCOSE, CAPILLARY
GLUCOSE-CAPILLARY: 215 mg/dL — AB (ref 65–99)
GLUCOSE-CAPILLARY: 236 mg/dL — AB (ref 65–99)
GLUCOSE-CAPILLARY: 174 mg/dL — AB (ref 65–99)
Glucose-Capillary: 163 mg/dL — ABNORMAL HIGH (ref 65–99)

## 2014-11-10 LAB — TROPONIN I
Troponin I: <0.03 ng/mL (ref <0.031)
Troponin I: <0.03 ng/mL (ref <0.031)

## 2014-11-10 MED ORDER — METOPROLOL TARTRATE 25 MG PO TABS
25.0000 mg | ORAL_TABLET | Freq: Two times a day (BID) | ORAL | Status: DC
  Administered 2014-11-10 – 2014-11-13 (×7): 25 mg via ORAL
  Filled 2014-11-10 (×7): qty 1

## 2014-11-10 MED ORDER — NICOTINE 14 MG/24HR TD PT24
14.0000 mg | MEDICATED_PATCH | Freq: Every day | TRANSDERMAL | Status: DC
  Administered 2014-11-10 – 2014-11-14 (×5): 14 mg via TRANSDERMAL
  Filled 2014-11-10 (×5): qty 1

## 2014-11-10 MED ORDER — SODIUM CHLORIDE 0.9 % IV SOLN
INTRAVENOUS | Status: DC
  Administered 2014-11-10: 20 mL/h via INTRAVENOUS

## 2014-11-10 NOTE — Progress Notes (Signed)
Notified EP-PA that despite being on cardizem gtts at 67mL/hr, and giving Metoprolol 25mg , pt continues to have HR sustain in the 120s-130s, and c/o SOB.  EP will have someone assess the pt.

## 2014-11-10 NOTE — Progress Notes (Addendum)
Patient Name: Melanie Lucas Date of Encounter: 11/10/2014  Principal Problem:   Atrial fibrillation with RVR Active Problems:   TOBACCO ABUSE   Essential hypertension, benign   CAD (coronary artery disease)   HLD (hyperlipidemia)   Diabetes mellitus  Cardiologist: Dr. Ena Dawley  Electrophysiologist: Dr. Jeneen Rinks Allred   Pt. Profile: Melanie Lucas is a 64 y.o. female with a hx of PAF controlled on Multaq, CAD, HTN, DM2, COPD, lymphedema, prior stroke. She stopped anticoagulation in 10/14 b/c of hematuria while taking Pradaxa. She had an MI in 02/2013 in [place] and underwent PCI with an Integrity stent to the D1. Seen in clinic 11/09/14 for 2 weeks hx of SOB and admitted directly due to Afib with RVR.   SUBJECTIVE  Still have SOB. Feels heart is racing. No CP.   CURRENT MEDS . apixaban  5 mg Oral BID  . furosemide  20 mg Oral Daily  . glimepiride  4 mg Oral Q breakfast  . insulin aspart  0-9 Units Subcutaneous TID WC  . metFORMIN  1,000 mg Oral Q supper  . multivitamin with minerals  1 tablet Oral Daily  . rosuvastatin  5 mg Oral QHS  . sodium chloride  3 mL Intravenous Q12H    OBJECTIVE  Filed Vitals:   11/09/14 2025 11/10/14 0028 11/10/14 0425 11/10/14 0810  BP: 107/78 136/80 103/58 115/75  Pulse: 92 108 112 117  Temp: 98.4 F (36.9 C) 98.6 F (37 C) 98.5 F (36.9 C) 98 F (36.7 C)  TempSrc: Oral Oral Oral Oral  Resp: 18 18 18 18   Height:      Weight:   208 lb 14.4 oz (94.756 kg)   SpO2: 96% 98% 93% 94%    Intake/Output Summary (Last 24 hours) at 11/10/14 1008 Last data filed at 11/10/14 0847  Gross per 24 hour  Intake    658 ml  Output      0 ml  Net    658 ml   Filed Weights   11/09/14 1800 11/10/14 0425  Weight: 213 lb 10 oz (96.9 kg) 208 lb 14.4 oz (94.756 kg)    PHYSICAL EXAM  General: Pleasant, NAD. Neuro: Alert and oriented X 3. Moves all extremities spontaneously. Psych: Normal affect. HEENT:  Normal  Neck: Supple without bruits or  JVD. Lungs:  Resp regular and unlabored, CTA. Heart: RRR no s3, s4, or murmurs. Abdomen: Soft, non-tender, non-distended, BS + x 4.  Extremities: No clubbing, cyanosis or edema. DP/PT/Radials 2+ and equal bilaterally.  Accessory Clinical Findings  CBC  Recent Labs  11/09/14 1839  WBC 6.9  NEUTROABS 4.3  HGB 14.0  HCT 40.3  MCV 91.6  PLT 791   Basic Metabolic Panel  Recent Labs  11/09/14 1839  NA 140  K 3.9  CL 105  CO2 26  GLUCOSE 238*  BUN 12  CREATININE 0.86  CALCIUM 8.8*  MG 1.9   Liver Function Tests  Recent Labs  11/09/14 1839  AST 44*  ALT 47  ALKPHOS 84  BILITOT 0.7  PROT 5.8*  ALBUMIN 3.1*   No results for input(s): LIPASE, AMYLASE in the last 72 hours. Cardiac Enzymes  Recent Labs  11/09/14 1839 11/09/14 2332 11/10/14 0520  TROPONINI <0.03 <0.03 <0.03   Thyroid Function Tests  Recent Labs  11/09/14 1839  TSH 2.619    TELE  Afib at rate of 120-130s  Radiology/Studies  Echo 05/29/13 LV EF: 55%  ------------------------------------------------------------ Indications:   Atrial fibrillation -  427.31. CAD of native vessels 414.01.  ------------------------------------------------------------ History:  PMH: Acquired from the patient and from the patient's chart. Atrial fibrillation. Coronary artery disease. Stroke. Chronic obstructive pulmonary disease. PMH:  Myocardial infarction. Risk factors: Current tobacco use. Hypertension. Diabetes mellitus. Obese.  ------------------------------------------------------------ Study Conclusions  - Left ventricle: Cannot rule out a very small area of apical hypokinesis The cavity size was normal. Systolic function was normal. The estimated ejection fraction was 55%. Wall motion was normal; there were no regional wall motion abnormalities. Features are consistent with a pseudonormal left ventricular filling pattern, with concomitant abnormal relaxation and  increased filling pressure (grade 2 diastolic dysfunction). - Left atrium: The atrium was moderately dilated. - Atrial septum: There was increased thickness of the septum, consistent with lipomatous hypertrophy. - Pulmonary arteries: PA peak pressure: 34mm Hg (S). Impressions:  - The right ventricular systolic pressure was increased consistent with mild pulmonary hypertension.  ASSESSMENT AND PLAN    1. Paroxysmal Atrial Fibrillation: Admitted directly from office with AF with RVR after discission with Dr. Cristopher Peru. Discontinued Multaq. Plan was for TEE after 3-5 doses of Eliquis >> if no LAA clot >> start Tikosyn. Consider DCCV 3 days after starting Tikosyn if remains in AFib. She felt candidate for Tikosyn as prior ECGs while in NSR demonstrate QTc 386 ms. - EKG yesterday showed Afib with QTc of 484ms. - Rate still in 120-130s on IV Diltiazem at rate of 15mg /hr.  - Continue Eliquis 5mg  BID. This morning received 2nd dose.  - Echo 05/29/13 - ?apical HK, EF 55%, Gr 2 DD, mod LAE, atrial septal lipomatous hypertrophy, PASP 47 mmHg.  - Will get repeat echo once rate is stable.  - Alternative to add digoxin vs amio short term for better rate control. Will discuss with MD.   2. CAD: S/p MI in [place] in 2015 tx with stent to D1. No angina.Trop x 3 negative.   3. HTN: Controlled.   4. Diabetes Mellitus: Continue current regimen.   5. Hyperlipidemia: Continue statin.   6. COPD: On spiriva at home. Here PRN nebulizer. Not actively wheezing.   7. Tobacco Abuse: advice smoking cessation.   8. LE edema R>L - Patient attributes this to chronic lymphedema. Lungs clear. If worsen, consider one dose of lasix.   Signed, Bhagat,Bhavinkumar PA-C   I have personally seen and examined this patient with B. Bhagat, PA-C. I agree with the assessment and plan as outlined above. She is admitted by Dr. Lovena Le with atrial fibrillation with RVR. She has failed Multaq. She is being admitted  for rate control and plans for TEE and initiation of Tikosyn. She has been started on Eliquis. She will receive her 3rd dose tonight. Will need TEE tomorrow and if no LAA clot, will start on Tikosyn. I will make her NPO tonight and arrange TEE for tomorrow. I will ask EP to see her tomorrow and manage her during this hospitalization. Continue Cardizem drip for rate control and will add metoprolol for better rate control.   MCALHANY,CHRISTOPHER 11/10/2014 1:22 PM

## 2014-11-10 NOTE — Progress Notes (Signed)
TED hose ordered, yet pt refuses.  Pt was educated on importance of TED hose, yet still refused.

## 2014-11-10 NOTE — Progress Notes (Signed)
Inpatient Diabetes Program Recommendations  AACE/ADA: New Consensus Statement on Inpatient Glycemic Control (2015)  Target Ranges:  Prepandial:   less than 140 mg/dL      Peak postprandial:   less than 180 mg/dL (1-2 hours)      Critically ill patients:  140 - 180 mg/dL   Results for HELINA, HULLUM (MRN [id number]) as of 11/10/2014 13:26  Ref. Range 11/09/2014 20:59 11/10/2014 06:09 11/10/2014 11:19  Glucose-Capillary Latest Ref Range: 65-99 mg/dL 197 (H) 163 (H) 215 (H)   Review of Glycemic Control  Diabetes history: DM 2 Outpatient Diabetes medications: Amaryl 4 mg Daily at supper, Metformin 1,000 mg Daily at supper Current orders for Inpatient glycemic control:  Amaryl 4 mg Daily, Metformin 1,000 mg Daily at supper, Novolog Sensitive TID  Inpatient Diabetes Program Recommendations: Correction (SSI): Glucose increasing around meal times on current regimen. Please consider increasing correction to Novolog Moderate correction TID.   Thanks,  Tama Headings RN, MSN, [facility] Inpatient Diabetes Coordinator Team Pager [phone] (8a-5p)

## 2014-11-11 ENCOUNTER — Inpatient Hospital Stay (HOSPITAL_COMMUNITY): Payer: Medicare Other

## 2014-11-11 ENCOUNTER — Encounter (HOSPITAL_COMMUNITY): Payer: Self-pay | Admitting: Nurse Practitioner

## 2014-11-11 ENCOUNTER — Encounter (HOSPITAL_COMMUNITY)
Admission: AD | Disposition: A | Discharge status: Home / Self Care | Payer: Self-pay | Source: Ambulatory Visit | Attending: Internal Medicine

## 2014-11-11 ENCOUNTER — Inpatient Hospital Stay (HOSPITAL_COMMUNITY): Payer: Medicare Other | Admitting: Anesthesiology

## 2014-11-11 DIAGNOSIS — I4891 Unspecified atrial fibrillation: Secondary | ICD-10-CM

## 2014-11-11 DIAGNOSIS — Z720 Tobacco use: Secondary | ICD-10-CM

## 2014-11-11 DIAGNOSIS — I340 Nonrheumatic mitral (valve) insufficiency: Secondary | ICD-10-CM

## 2014-11-11 DIAGNOSIS — I2510 Atherosclerotic heart disease of native coronary artery without angina pectoris: Secondary | ICD-10-CM

## 2014-11-11 DIAGNOSIS — I10 Essential (primary) hypertension: Secondary | ICD-10-CM

## 2014-11-11 HISTORY — PX: TEE WITHOUT CARDIOVERSION: SHX5443

## 2014-11-11 HISTORY — PX: CARDIOVERSION: SHX1299

## 2014-11-11 LAB — GLUCOSE, CAPILLARY
Glucose-Capillary: 145 mg/dL — ABNORMAL HIGH (ref 65–99)
Glucose-Capillary: 211 mg/dL — ABNORMAL HIGH (ref 65–99)
Glucose-Capillary: 181 mg/dL — ABNORMAL HIGH (ref 65–99)

## 2014-11-11 LAB — BASIC METABOLIC PANEL
Anion gap: 9 (ref 5–15)
BUN: 7 mg/dL (ref 6–20)
CHLORIDE: 105 mmol/L (ref 101–111)
CO2: 26 mmol/L (ref 22–32)
Calcium: 9.1 mg/dL (ref 8.9–10.3)
Creatinine, Ser: 0.7 mg/dL (ref 0.44–1.00)
GFR calc Af Amer: >60 mL/min (ref >60)
GLUCOSE: 193 mg/dL — AB (ref 65–99)
POTASSIUM: 4 mmol/L (ref 3.5–5.1)
Sodium: 140 mmol/L (ref 135–145)

## 2014-11-11 LAB — MAGNESIUM: Magnesium: 2 mg/dL (ref 1.7–2.4)

## 2014-11-11 SURGERY — ECHOCARDIOGRAM, TRANSESOPHAGEAL
Anesthesia: General

## 2014-11-11 MED ORDER — PHENYLEPHRINE HCL 10 MG/ML IJ SOLN
INTRAMUSCULAR | Status: DC | PRN
  Administered 2014-11-11 (×4): 80 ug via INTRAVENOUS

## 2014-11-11 MED ORDER — PROPOFOL INFUSION 10 MG/ML OPTIME
INTRAVENOUS | Status: DC | PRN
  Administered 2014-11-11: 150 ug/kg/min via INTRAVENOUS

## 2014-11-11 MED ORDER — FENTANYL CITRATE (PF) 250 MCG/5ML IJ SOLN
INTRAMUSCULAR | Status: AC
  Filled 2014-11-11: qty 5

## 2014-11-11 MED ORDER — ATROPINE SULFATE 0.4 MG/ML IJ SOLN
INTRAMUSCULAR | Status: DC | PRN
  Administered 2014-11-11 (×2): 0.2 mg via INTRAVENOUS

## 2014-11-11 MED ORDER — DOPAMINE-DEXTROSE 3.2-5 MG/ML-% IV SOLN
0.0000 ug/kg/min | INTRAVENOUS | Status: DC
  Filled 2014-11-11: qty 250

## 2014-11-11 MED ORDER — SUCCINYLCHOLINE CHLORIDE 20 MG/ML IJ SOLN
INTRAMUSCULAR | Status: DC | PRN
  Administered 2014-11-11: 100 mg via INTRAVENOUS

## 2014-11-11 MED ORDER — FENTANYL CITRATE (PF) 100 MCG/2ML IJ SOLN
INTRAMUSCULAR | Status: DC | PRN
  Administered 2014-11-11: 50 ug via INTRAVENOUS

## 2014-11-11 MED ORDER — MIDAZOLAM HCL 2 MG/2ML IJ SOLN
INTRAMUSCULAR | Status: AC
  Filled 2014-11-11: qty 4

## 2014-11-11 MED ORDER — EPHEDRINE SULFATE 50 MG/ML IJ SOLN
INTRAMUSCULAR | Status: DC | PRN
  Administered 2014-11-11: 10 mg via INTRAVENOUS
  Administered 2014-11-11: 20 mg via INTRAVENOUS
  Administered 2014-11-11: 10 mg via INTRAVENOUS

## 2014-11-11 MED ORDER — LACTATED RINGERS IV SOLN
INTRAVENOUS | Status: DC | PRN
  Administered 2014-11-11: 12:00:00 via INTRAVENOUS

## 2014-11-11 MED ORDER — MIDAZOLAM HCL 5 MG/5ML IJ SOLN
INTRAMUSCULAR | Status: DC | PRN
  Administered 2014-11-11: 2 mg via INTRAVENOUS

## 2014-11-11 NOTE — Procedures (Signed)
Electrical Cardioversion Procedure Note Melanie Lucas [id number] [birth year]  Procedure: Electrical Cardioversion Indications:  Atrial Fibrillation  Procedure Details Consent: Risks of procedure as well as the alternatives and risks of each were explained to the (patient/caregiver).  Consent for procedure obtained. Time Out: Verified patient identification, verified procedure, site/side was marked, verified correct patient position, special equipment/implants available, medications/allergies/relevent history reviewed, required imaging and test results available.  Performed  Patient placed on cardiac monitor, pulse oximetry, supplemental oxygen as necessary.  Sedation given: General anesthesia Pacer pads placed anterior and posterior chest.  Cardioverted 1 time(s).  Cardioverted at [place].  Evaluation Findings: Post procedure EKG shows: NSR in 30s.  Patient was given atropine and ephredrine, developed junctional rhythm in 60s. BP initially low but increased to normal range.   Loralie Champagne 11/11/2014, 1:07 PM

## 2014-11-11 NOTE — CV Procedure (Signed)
Procedure: TEE  Indication: Atrial fibrillation  Sedation: General anesthesia  Findings: Please see echo section for full report.  The patient was in rapid atrial fibrillation.  Normal LV size with EF 35-40%, diffuse hypokinesis.  Normal RV size with mildly decreased systolic function.  Mild MR.  Trileaflet aortic valve with no stenosis or regurgitation.  Trivial TR. Mildly dilated left atrium with no LAA thrombus.  Normal caliber aorta with mild plaque.   Will proceed to DCCV.   Loralie Champagne 11/11/2014 1:07 PM

## 2014-11-11 NOTE — Anesthesia Preprocedure Evaluation (Signed)
Anesthesia Evaluation  Patient identified by MRN, date of birth, ID band Patient awake    Reviewed: Allergy & Precautions, NPO status , Patient's Chart, lab work & pertinent test results  Airway Mallampati: II  TM Distance: >3 FB Neck ROM: Full    Dental  (+) Edentulous Upper, Partial Lower   Pulmonary COPD, Current Smoker,    breath sounds clear to auscultation       Cardiovascular hypertension, Pt. on medications + CAD   Rhythm:Irregular     Neuro/Psych    GI/Hepatic   Endo/Other  diabetes, Type 2  Renal/GU      Musculoskeletal  (+) Arthritis ,   Abdominal   Peds  Hematology   Anesthesia Other Findings   Reproductive/Obstetrics                             Anesthesia Physical Anesthesia Plan  ASA: III  Anesthesia Plan: General   Post-op Pain Management:    Induction: Intravenous  Airway Management Planned: Mask  Additional Equipment:   Intra-op Plan:   Post-operative Plan: Extubation in OR  Informed Consent: I have reviewed the patients History and Physical, chart, labs and discussed the procedure including the risks, benefits and alternatives for the proposed anesthesia with the patient or authorized representative who has indicated his/her understanding and acceptance.   Dental advisory given  Plan Discussed with: CRNA and Anesthesiologist  Anesthesia Plan Comments:         Anesthesia Quick Evaluation

## 2014-11-11 NOTE — Anesthesia Postprocedure Evaluation (Signed)
  Anesthesia Post-op Note  Patient: Melanie Lucas  Procedure(s) Performed: Procedure(s): TRANSESOPHAGEAL ECHOCARDIOGRAM (TEE) (N/A) CARDIOVERSION (N/A)  Patient Location: PACU  Anesthesia Type:General  Level of Consciousness: awake, alert  and oriented  Airway and Oxygen Therapy: Patient Spontanous Breathing  Post-op Pain: none  Post-op Assessment: Post-op Vital signs reviewed, Patient's Cardiovascular Status Stable, Respiratory Function Stable, Patent Airway and Pain level controlled              Post-op Vital Signs: stable  Last Vitals:  Filed Vitals:   11/11/14 1430  BP: 97/60  Pulse: 55  Temp: 36.8 C  Resp: 17    Complications: No apparent anesthesia complications

## 2014-11-11 NOTE — Progress Notes (Signed)
Utilization review completed. Bertha Stanfill, RN, BSN. 

## 2014-11-11 NOTE — Transfer of Care (Signed)
Immediate Anesthesia Transfer of Care Note  Patient: Melanie Lucas  Procedure(s) Performed: Procedure(s): TRANSESOPHAGEAL ECHOCARDIOGRAM (TEE) (N/A) CARDIOVERSION (N/A)  Patient Location: PACU  Anesthesia Type:General  Level of Consciousness: awake and alert   Airway & Oxygen Therapy: Patient Spontanous Breathing and Patient connected to face mask oxygen  Post-op Assessment: Report given to RN and Post -op Vital signs reviewed and stable  Post vital signs: Reviewed and stable  Last Vitals:  Filed Vitals:   11/11/14 1156  BP: 107/69  Pulse: 110  Temp: 36.8 C  Resp: 18    Complications: No apparent anesthesia complications

## 2014-11-11 NOTE — Progress Notes (Signed)
  Echocardiogram Echocardiogram Transesophageal has been performed.  Bobbye Charleston 11/11/2014, 1:00 PM

## 2014-11-11 NOTE — Consult Note (Signed)
ELECTROPHYSIOLOGY CONSULT NOTE    Patient ID: Melanie Lucas MRN: [id number], DOB/AGE: January 27, 1951 64 y.o.  Admit date: 11/09/2014 Date of Consult: 11/11/2014  Primary Physician: Tawanna Solo, MD Primary Cardiologist: Meda Coffee  Reason for Consultation: atrial fibrillation  HPI:  Melanie Lucas is a 64 y.o. female with a past medical history significant for diabetes, hypertension, COPD, obesity, CAD s/p BMS, and prior TIA.  She was seen in the office on 9/20 with complaints of palpitations, increased shortness of breath, and fatigue. She was found to be in AF with RVR. She was admitted and Eliquis was started with plans for eventual Tikosyn loading after TEE was done.  EP has been asked to evaluate for treatment options.   She was first diagnosed with atrial fibrillation several years ago and has been maintained on Multaq with relative good control of atrial fibrillation until recently. She had stopped taking [place] 2/2 hematuria in the past.  She has been noncompliant with office follow-up.  Last echo 05/2013 demonstrated EF 55%, no RWMA, grade 2 diastolic dysfunction, LA 49.  Past Medical History  Diagnosis Date  . Diabetes mellitus   . Hypertension   . PAF with post-termination pauses     a. on dronedarone;  b. CHA2DS2VASc = 5 (HTN, DM, h/o lacunar infarct on MRI, Female) ->refused oral anticoagulation after h/o hematuria on pradaxa;  c. 02/2012 Echo: EF 55-60%, mildly dil LA. D. Recurrent PAF 10/2012 after only taking Multaq 1x/day - spont conv to NSR, placed back on BID Multaq/eliquis.  [name] COPD (chronic obstructive pulmonary disease)   . Obesity   . Chronic acquired lymphedema     a. R>L;  b. 03/2012 Neg LE U/S for DVT.  [name] Osteoarthritis   . Meniere disease   . CVD (cardiovascular disease)   . Hematuria     a. while on pradaxa,  she reports that she has seen Dr Margie Ege and had low risk cystoscopy  . Tobacco abuse   . Lacunar infarction     a. 02/2009 non-acute Lacunar infarct of the right  thalamus noted on MRI of brain.  . Elevated TSH     a. 10/2012 - inst to f/u PCP.  [name] Coronary artery disease   . Hyperlipidemia      Surgical History:  Past Surgical History  Procedure Laterality Date  . Breast biopsy    . Biopsy of adrenal glands       Prescriptions prior to admission  Medication Sig Dispense Refill Last Dose  . albuterol (PROVENTIL HFA;VENTOLIN HFA) 108 (90 BASE) MCG/ACT inhaler Inhale into the lungs every 6 (six) hours as needed for wheezing or shortness of breath.   Past Month at Unknown time  . aspirin 325 MG tablet Take 325 mg by mouth at bedtime.    11/08/2014 at Unknown time  . CALCIUM PO Take 1,000 mg by mouth daily.   11/08/2014 at Unknown time  . diazepam (VALIUM) 2 MG tablet Take 2 mg by mouth daily as needed for anxiety.    2-3 weeks ago  . diltiazem (CARDIZEM CD) 120 MG 24 hr capsule Take 1 capsule (120 mg total) by mouth daily. 90 capsule 3 11/08/2014 at Unknown time  . dronedarone (MULTAQ) 400 MG tablet Take 400 mg by mouth at bedtime.   11/08/2014 at Unknown time  . furosemide (LASIX) 20 MG tablet Take 20 mg by mouth daily.   11/08/2014 at Unknown time  . glimepiride (AMARYL) 4 MG tablet Take 4 mg by mouth at  bedtime.   11/08/2014 at Unknown time  . ibuprofen (ADVIL,MOTRIN) 800 MG tablet Take 800 mg by mouth daily as needed for headache or mild pain.    Past Week at Unknown time  . metFORMIN (GLUCOPHAGE) 1000 MG tablet Take 1,000 mg by mouth at bedtime.    11/08/2014 at Unknown time  . Multiple Vitamin (MULTIVITAMIN) tablet Take 1 tablet by mouth daily.   11/08/2014 at Unknown time  . propranolol (INDERAL) 20 MG tablet Take 20 mg by mouth 2 (two) times daily as needed (for shaking).    11/06/2014  . rosuvastatin (CRESTOR) 5 MG tablet Take 5 mg by mouth at bedtime.    11/08/2014 at Unknown time  . tiotropium (SPIRIVA) 18 MCG inhalation capsule Place 18 mcg into inhaler and inhale daily as needed (only when she feels short of breath).    Past Month at Unknown time    . VALTREX 500 MG tablet Take 500 mg by mouth 2 (two) times daily as needed (for outbreaks).    3 months  . [DISCONTINUED] dronedarone (MULTAQ) 400 MG tablet Take 1 tablet (400 mg total) by mouth 2 (two) times daily with a meal. (Patient taking differently: Take 400 mg by mouth at bedtime. )   Taking    Inpatient Medications:  . apixaban  5 mg Oral BID  . furosemide  20 mg Oral Daily  . glimepiride  4 mg Oral Q breakfast  . insulin aspart  0-9 Units Subcutaneous TID WC  . metFORMIN  1,000 mg Oral Q supper  . metoprolol tartrate  25 mg Oral BID  . multivitamin with minerals  1 tablet Oral Daily  . nicotine  14 mg Transdermal Daily  . rosuvastatin  5 mg Oral QHS  . sodium chloride  3 mL Intravenous Q12H    Allergies:  Allergies  Allergen Reactions  . Statins Other (See Comments)    Muscle pain  . Sulfonamide Derivatives Hives    Nausea vertigo  . Tape Itching and Rash    Please use "paper" tape only    Social History   Social History  . Marital Status: Married    Spouse Name: N/A  . Number of Children: N/A  . Years of Education: N/A   Occupational History  . Not on file.   Social History Main Topics  . Smoking status: Current Every Day Smoker -- 1.00 packs/day for 46 years    Types: Cigarettes  . Smokeless tobacco: Never Used     Comment: not ready to quit  . Alcohol Use: No  . Drug Use: No  . Sexual Activity: No   Other Topics Concern  . Not on file   Social History Narrative   She operates an entertainment business     Family History  Problem Relation Age of Onset  . Heart attack Father   . Coronary artery disease Father     strong family hx  . Leukemia Brother   . Cancer Mother     bladder     Review of Systems: General: No chills, fever, night sweats or weight changes  Cardiovascular:  No chest pain, orthopnea, paroxysmal nocturnal dyspnea Dermatological: No rash, lesions or masses Respiratory: No cough, dyspnea Urologic: No hematuria,  dysuria Abdominal: No nausea, vomiting, diarrhea, bright red blood per rectum, melena, or hematemesis Neurologic: No visual changes, weakness, changes in mental status All other systems reviewed and are otherwise negative except as noted above.  Physical Exam: Filed Vitals:   11/10/14 1613 11/10/14  2037 11/11/14 0024 11/11/14 0509  BP: 111/84 117/71 107/62 99/64  Pulse: 116 132 120 129  Temp: 98.4 F (36.9 C) 97.5 F (36.4 C) 98.4 F (36.9 C) 98.3 F (36.8 C)  TempSrc: Oral Oral Oral Oral  Resp: 18 18  18   Height:      Weight:    214 lb 6.4 oz (97.251 kg)  SpO2: 96% 95% 97% 96%    GEN- The patient is obese appearing, alert and oriented x 3 today.   HEENT: normocephalic, atraumatic; sclera clear, conjunctiva pink; hearing intact; oropharynx clear; neck supple  Lungs- Clear to ausculation bilaterally, normal work of breathing.  No wheezes, rales, rhonchi Heart- Tachycardic irregular rate and rhythm  GI- obese, non-tender, non-distended, bowel sounds present  Extremities- no clubbing, cyanosis MS- no significant deformity or atrophy Skin- warm and dry, no rash or lesion Psych- euthymic mood, full affect Neuro- strength and sensation are intact  Labs:   Lab Results  Component Value Date   WBC 6.9 11/09/2014   HGB 14.0 11/09/2014   HCT 40.3 11/09/2014   MCV 91.6 11/09/2014   PLT 180 11/09/2014     Recent Labs Lab 11/09/14 1839  NA 140  K 3.9  CL 105  CO2 26  BUN 12  CREATININE 0.86  CALCIUM 8.8*  PROT 5.8*  BILITOT 0.7  ALKPHOS 84  ALT 47  AST 44*  GLUCOSE 238*      Radiology/Studies: X-ray Chest Pa And Lateral 11/09/2014   CLINICAL DATA:  64 year old female currently admitted with atrial fibrillation with rapid ventricular response  EXAM: CHEST  2 VIEW  COMPARISON:  Prior chest x-ray 11/10/2012  FINDINGS: Borderline cardiomegaly. Trace atherosclerotic calcification in the transverse aorta. Pulmonary vascular congestion bordering on mild interstitial edema. No  focal airspace consolidation, pleural effusion or pneumothorax. Diffuse central bronchitic change. No acute osseous abnormality.  IMPRESSION: 1. Pulmonary vascular congestion bordering on mild interstitial edema. 2. Stable borderline cardiomegaly. 3. Aortic atherosclerosis.   Electronically Signed   By: Jacqulynn Cadet M.D.   On: 11/09/2014 19:43   EKG: AF with RVR, ventricular rate 146, QTc 406  TELEMETRY: atrial fibrillation with RVR, rates 100-120  Assessment/Plan: 1.  Persistent atrial fibrillation The patient has persistent atrial fibrillation with RVR and has failed Multaq.  She has been started on Eliquis 11/09/14.   Plan TEE/DCCV today  Continue Eliquis long term for CHADS2VASC of at least 7 Keep K >3.9, Mg >1.8 Will monitor QTc on Tikosyn.  The importance of compliance was discussed at length today. Anticipate discharge Sunday Would repeat TTE when in SR to evaluate LA size  2.  Obesity Weight loss encouraged  3.  HTN Stable No change required today  4. CAD No recent ischemic symptoms    Signed, Chanetta Marshall, NP 11/11/2014 8:03 AM   I have seen, examined the patient, and reviewed the above assessment and plan.  On exam, tachycardic irregular rhythm.  Changes to above are made where necessary.  Will initiate tikosyn after TEE guided cardioversion today.  Risks, benefits, and alternatives to this procedure were discussed at length with the patient who wishes to proceed at this time.  Co Sign: Thompson Grayer, MD 11/11/2014 11:16 AM

## 2014-11-11 NOTE — H&P (View-Only) (Signed)
ELECTROPHYSIOLOGY CONSULT NOTE    Patient ID: Melanie Lucas MRN: [id number], DOB/AGE: 23-Jan-1951 64 y.o.  Admit date: 11/09/2014 Date of Consult: 11/11/2014  Primary Physician: Tawanna Solo, MD Primary Cardiologist: Meda Coffee  Reason for Consultation: atrial fibrillation  HPI:  Melanie Lucas is a 64 y.o. female with a past medical history significant for diabetes, hypertension, COPD, obesity, CAD s/p BMS, and prior TIA.  She was seen in the office on 9/20 with complaints of palpitations, increased shortness of breath, and fatigue. She was found to be in AF with RVR. She was admitted and Eliquis was started with plans for eventual Tikosyn loading after TEE was done.  EP has been asked to evaluate for treatment options.   She was first diagnosed with atrial fibrillation several years ago and has been maintained on Multaq with relative good control of atrial fibrillation until recently. She had stopped taking [place] 2/2 hematuria in the past.  She has been noncompliant with office follow-up.  Last echo 05/2013 demonstrated EF 55%, no RWMA, grade 2 diastolic dysfunction, LA 49.  Past Medical History  Diagnosis Date  . Diabetes mellitus   . Hypertension   . PAF with post-termination pauses     a. on dronedarone;  b. CHA2DS2VASc = 5 (HTN, DM, h/o lacunar infarct on MRI, Female) ->refused oral anticoagulation after h/o hematuria on pradaxa;  c. 02/2012 Echo: EF 55-60%, mildly dil LA. D. Recurrent PAF 10/2012 after only taking Multaq 1x/day - spont conv to NSR, placed back on BID Multaq/eliquis.  [name] COPD (chronic obstructive pulmonary disease)   . Obesity   . Chronic acquired lymphedema     a. R>L;  b. 03/2012 Neg LE U/S for DVT.  [name] Osteoarthritis   . Meniere disease   . CVD (cardiovascular disease)   . Hematuria     a. while on pradaxa,  she reports that she has seen Dr Margie Ege and had low risk cystoscopy  . Tobacco abuse   . Lacunar infarction     a. 02/2009 non-acute Lacunar infarct of the right  thalamus noted on MRI of brain.  . Elevated TSH     a. 10/2012 - inst to f/u PCP.  [name] Coronary artery disease   . Hyperlipidemia      Surgical History:  Past Surgical History  Procedure Laterality Date  . Breast biopsy    . Biopsy of adrenal glands       Prescriptions prior to admission  Medication Sig Dispense Refill Last Dose  . albuterol (PROVENTIL HFA;VENTOLIN HFA) 108 (90 BASE) MCG/ACT inhaler Inhale into the lungs every 6 (six) hours as needed for wheezing or shortness of breath.   Past Month at Unknown time  . aspirin 325 MG tablet Take 325 mg by mouth at bedtime.    11/08/2014 at Unknown time  . CALCIUM PO Take 1,000 mg by mouth daily.   11/08/2014 at Unknown time  . diazepam (VALIUM) 2 MG tablet Take 2 mg by mouth daily as needed for anxiety.    2-3 weeks ago  . diltiazem (CARDIZEM CD) 120 MG 24 hr capsule Take 1 capsule (120 mg total) by mouth daily. 90 capsule 3 11/08/2014 at Unknown time  . dronedarone (MULTAQ) 400 MG tablet Take 400 mg by mouth at bedtime.   11/08/2014 at Unknown time  . furosemide (LASIX) 20 MG tablet Take 20 mg by mouth daily.   11/08/2014 at Unknown time  . glimepiride (AMARYL) 4 MG tablet Take 4 mg by mouth at  bedtime.   11/08/2014 at Unknown time  . ibuprofen (ADVIL,MOTRIN) 800 MG tablet Take 800 mg by mouth daily as needed for headache or mild pain.    Past Week at Unknown time  . metFORMIN (GLUCOPHAGE) 1000 MG tablet Take 1,000 mg by mouth at bedtime.    11/08/2014 at Unknown time  . Multiple Vitamin (MULTIVITAMIN) tablet Take 1 tablet by mouth daily.   11/08/2014 at Unknown time  . propranolol (INDERAL) 20 MG tablet Take 20 mg by mouth 2 (two) times daily as needed (for shaking).    11/06/2014  . rosuvastatin (CRESTOR) 5 MG tablet Take 5 mg by mouth at bedtime.    11/08/2014 at Unknown time  . tiotropium (SPIRIVA) 18 MCG inhalation capsule Place 18 mcg into inhaler and inhale daily as needed (only when she feels short of breath).    Past Month at Unknown time    . VALTREX 500 MG tablet Take 500 mg by mouth 2 (two) times daily as needed (for outbreaks).    3 months  . [DISCONTINUED] dronedarone (MULTAQ) 400 MG tablet Take 1 tablet (400 mg total) by mouth 2 (two) times daily with a meal. (Patient taking differently: Take 400 mg by mouth at bedtime. )   Taking    Inpatient Medications:  . apixaban  5 mg Oral BID  . furosemide  20 mg Oral Daily  . glimepiride  4 mg Oral Q breakfast  . insulin aspart  0-9 Units Subcutaneous TID WC  . metFORMIN  1,000 mg Oral Q supper  . metoprolol tartrate  25 mg Oral BID  . multivitamin with minerals  1 tablet Oral Daily  . nicotine  14 mg Transdermal Daily  . rosuvastatin  5 mg Oral QHS  . sodium chloride  3 mL Intravenous Q12H    Allergies:  Allergies  Allergen Reactions  . Statins Other (See Comments)    Muscle pain  . Sulfonamide Derivatives Hives    Nausea vertigo  . Tape Itching and Rash    Please use "paper" tape only    Social History   Social History  . Marital Status: Married    Spouse Name: N/A  . Number of Children: N/A  . Years of Education: N/A   Occupational History  . Not on file.   Social History Main Topics  . Smoking status: Current Every Day Smoker -- 1.00 packs/day for 46 years    Types: Cigarettes  . Smokeless tobacco: Never Used     Comment: not ready to quit  . Alcohol Use: No  . Drug Use: No  . Sexual Activity: No   Other Topics Concern  . Not on file   Social History Narrative   She operates an entertainment business     Family History  Problem Relation Age of Onset  . Heart attack Father   . Coronary artery disease Father     strong family hx  . Leukemia Brother   . Cancer Mother     bladder     Review of Systems: General: No chills, fever, night sweats or weight changes  Cardiovascular:  No chest pain, orthopnea, paroxysmal nocturnal dyspnea Dermatological: No rash, lesions or masses Respiratory: No cough, dyspnea Urologic: No hematuria,  dysuria Abdominal: No nausea, vomiting, diarrhea, bright red blood per rectum, melena, or hematemesis Neurologic: No visual changes, weakness, changes in mental status All other systems reviewed and are otherwise negative except as noted above.  Physical Exam: Filed Vitals:   11/10/14 1613 11/10/14  2037 11/11/14 0024 11/11/14 0509  BP: 111/84 117/71 107/62 99/64  Pulse: 116 132 120 129  Temp: 98.4 F (36.9 C) 97.5 F (36.4 C) 98.4 F (36.9 C) 98.3 F (36.8 C)  TempSrc: Oral Oral Oral Oral  Resp: 18 18  18   Height:      Weight:    214 lb 6.4 oz (97.251 kg)  SpO2: 96% 95% 97% 96%    GEN- The patient is obese appearing, alert and oriented x 3 today.   HEENT: normocephalic, atraumatic; sclera clear, conjunctiva pink; hearing intact; oropharynx clear; neck supple  Lungs- Clear to ausculation bilaterally, normal work of breathing.  No wheezes, rales, rhonchi Heart- Tachycardic irregular rate and rhythm  GI- obese, non-tender, non-distended, bowel sounds present  Extremities- no clubbing, cyanosis MS- no significant deformity or atrophy Skin- warm and dry, no rash or lesion Psych- euthymic mood, full affect Neuro- strength and sensation are intact  Labs:   Lab Results  Component Value Date   WBC 6.9 11/09/2014   HGB 14.0 11/09/2014   HCT 40.3 11/09/2014   MCV 91.6 11/09/2014   PLT 180 11/09/2014     Recent Labs Lab 11/09/14 1839  NA 140  K 3.9  CL 105  CO2 26  BUN 12  CREATININE 0.86  CALCIUM 8.8*  PROT 5.8*  BILITOT 0.7  ALKPHOS 84  ALT 47  AST 44*  GLUCOSE 238*      Radiology/Studies: X-ray Chest Pa And Lateral 11/09/2014   CLINICAL DATA:  64 year old female currently admitted with atrial fibrillation with rapid ventricular response  EXAM: CHEST  2 VIEW  COMPARISON:  Prior chest x-ray 11/10/2012  FINDINGS: Borderline cardiomegaly. Trace atherosclerotic calcification in the transverse aorta. Pulmonary vascular congestion bordering on mild interstitial edema. No  focal airspace consolidation, pleural effusion or pneumothorax. Diffuse central bronchitic change. No acute osseous abnormality.  IMPRESSION: 1. Pulmonary vascular congestion bordering on mild interstitial edema. 2. Stable borderline cardiomegaly. 3. Aortic atherosclerosis.   Electronically Signed   By: Jacqulynn Cadet M.D.   On: 11/09/2014 19:43   EKG: AF with RVR, ventricular rate 146, QTc 406  TELEMETRY: atrial fibrillation with RVR, rates 100-120  Assessment/Plan: 1.  Persistent atrial fibrillation The patient has persistent atrial fibrillation with RVR and has failed Multaq.  She has been started on Eliquis 11/09/14.   Plan TEE/DCCV today  Continue Eliquis long term for CHADS2VASC of at least 7 Keep K >3.9, Mg >1.8 Will monitor QTc on Tikosyn.  The importance of compliance was discussed at length today. Anticipate discharge Sunday Would repeat TTE when in SR to evaluate LA size  2.  Obesity Weight loss encouraged  3.  HTN Stable No change required today  4. CAD No recent ischemic symptoms    Signed, Chanetta Marshall, NP 11/11/2014 8:03 AM   I have seen, examined the patient, and reviewed the above assessment and plan.  On exam, tachycardic irregular rhythm.  Changes to above are made where necessary.  Will initiate tikosyn after TEE guided cardioversion today.  Risks, benefits, and alternatives to this procedure were discussed at length with the patient who wishes to proceed at this time.  Co Sign: Thompson Grayer, MD 11/11/2014 11:16 AM

## 2014-11-11 NOTE — Anesthesia Procedure Notes (Signed)
Procedure Name: Intubation Date/Time: 11/11/2014 12:35 PM Performed by: Ollen Bowl Pre-anesthesia Checklist: Emergency Drugs available, Patient identified, Suction available, Patient being monitored and Timeout performed Patient Re-evaluated:Patient Re-evaluated prior to inductionOxygen Delivery Method: Circle system utilized and Simple face mask Preoxygenation: Pre-oxygenation with 100% oxygen Intubation Type: IV induction Ventilation: Mask ventilation without difficulty Laryngoscope Size: Mac and 3 Grade View: Grade I Tube type: Oral Tube size: 7.5 mm Number of attempts: 1 Airway Equipment and Method: Patient positioned with wedge pillow and Stylet Placement Confirmation: ETT inserted through vocal cords under direct vision,  positive ETCO2 and breath sounds checked- equal and bilateral Secured at: 22 cm Tube secured with: Tape Dental Injury: Teeth and Oropharynx as per pre-operative assessment

## 2014-11-11 NOTE — Interval H&P Note (Signed)
History and Physical Interval Note:  11/11/2014 12:13 PM  Melanie Lucas  has presented today for surgery, with the diagnosis of AFIB  The various methods of treatment have been discussed with the patient and family. After consideration of risks, benefits and other options for treatment, the patient has consented to  Procedure(s): TRANSESOPHAGEAL ECHOCARDIOGRAM (TEE) (N/A) CARDIOVERSION (N/A) as a surgical intervention .  The patient's history has been reviewed, patient examined, no change in status, stable for surgery.  I have reviewed the patient's chart and labs.  Questions were answered to the patient's satisfaction.     [place] [employer]

## 2014-11-12 ENCOUNTER — Inpatient Hospital Stay (HOSPITAL_COMMUNITY): Payer: Medicare Other

## 2014-11-12 ENCOUNTER — Encounter (HOSPITAL_COMMUNITY): Payer: Self-pay | Admitting: Cardiology

## 2014-11-12 DIAGNOSIS — I4891 Unspecified atrial fibrillation: Secondary | ICD-10-CM

## 2014-11-12 LAB — GLUCOSE, CAPILLARY
GLUCOSE-CAPILLARY: 193 mg/dL — AB (ref 65–99)
GLUCOSE-CAPILLARY: 157 mg/dL — AB (ref 65–99)
Glucose-Capillary: 208 mg/dL — ABNORMAL HIGH (ref 65–99)
Glucose-Capillary: 141 mg/dL — ABNORMAL HIGH (ref 65–99)
Glucose-Capillary: 138 mg/dL — ABNORMAL HIGH (ref 65–99)

## 2014-11-12 MED ORDER — DOFETILIDE 500 MCG PO CAPS
500.0000 ug | ORAL_CAPSULE | Freq: Two times a day (BID) | ORAL | Status: DC
  Administered 2014-11-12 – 2014-11-14 (×5): 500 ug via ORAL
  Filled 2014-11-12 (×5): qty 1

## 2014-11-12 MED ORDER — MENTHOL 3 MG MT LOZG
1.0000 | LOZENGE | OROMUCOSAL | Status: DC | PRN
  Administered 2014-11-12: 3 mg via ORAL
  Filled 2014-11-12: qty 9

## 2014-11-12 MED ORDER — SODIUM CHLORIDE 0.9 % IV SOLN: 250.0000 mL | INTRAVENOUS | Status: DC | PRN

## 2014-11-12 MED ORDER — SODIUM CHLORIDE 0.9 % IJ SOLN: 3.0000 mL | Freq: Two times a day (BID) | INTRAMUSCULAR | Status: DC

## 2014-11-12 MED ORDER — SODIUM CHLORIDE 0.9 % IJ SOLN: 3.0000 mL | INTRAMUSCULAR | Status: DC | PRN

## 2014-11-12 NOTE — Progress Notes (Signed)
Pharmacy Review for Dofetilide (Tikosyn) Initiation  Admit Complaint: 64 y.o. female admitted 11/09/2014 with atrial fibrillation to be initiated on dofetilide.   Assessment:  Patient Exclusion Criteria: If any screening criteria checked as "Yes", then  patient  should NOT receive dofetilide until criteria item is corrected. If "Yes" please indicate correction plan.  YES  NO Patient  Exclusion Criteria Correction Plan  []  [x]  Baseline QTc interval is greater than or equal to 440 msec. IF above YES box checked dofetilide contraindicated unless patient has ICD; then may proceed if QTc 500-550 msec or with known ventricular conduction abnormalities may proceed with QTc 550-600 msec. QTc = 0.41   []  [x]  Magnesium level is less than 1.8 mEq/l : Last magnesium:  Lab Results  Component Value Date   MG 2.0 11/11/2014         []  [x]  Potassium level is less than 4 mEq/l : Last potassium:  Lab Results  Component Value Date   K 4.0 11/11/2014         []  [x]  Patient is known or suspected to have a digoxin level greater than 2 ng/ml: No results found for: DIGOXIN    []  [x]  Creatinine clearance less than 20 ml/min (calculated using Cockcroft-Gault, actual body weight and serum creatinine): Estimated Creatinine Clearance: 82.9 mL/min (by C-G formula based on Cr of 0.7).    []  [x]  Patient has received drugs known to prolong the QT intervals within the last 48 hours (phenothiazines, tricyclics or tetracyclic antidepressants, erythromycin, H-1 antihistamines, cisapride, fluoroquinolones, azithromycin). Drugs not listed above may have an, as yet, undetected potential to prolong the QT interval, updated information on QT prolonging agents is available at this website:QT prolonging agents   []  [x]  Patient received a dose of hydrochlorothiazide (Oretic) alone or in any combination including triamterene (Dyazide, Maxzide) in the last 48 hours.   []  [x]  Patient received a medication known to increase  dofetilide plasma concentrations prior to initial dofetilide dose:  . Trimethoprim (Primsol, Proloprim) in the last 36 hours . Verapamil (Calan, Verelan) in the last 36 hours or a sustained release dose in the last 72 hours . Megestrol (Megace) in the last 5 days  . Cimetidine (Tagamet) in the last 6 hours . Ketoconazole (Nizoral) in the last 24 hours . Itraconazole (Sporanox) in the last 48 hours  . Prochlorperazine (Compazine) in the last 36 hours    []  [x]  Patient is known to have a history of torsades de pointes; congenital or acquired long QT syndromes.   []  [x]  Patient has received a Class 1 antiarrhythmic with less than 2 half-lives since last dose. (Disopyramide, Quinidine, Procainamide, Lidocaine, Mexiletine, Flecainide, Propafenone)   []  [x]  Patient has received amiodarone therapy in the past 3 months or amiodarone level is greater than 0.3 ng/ml.    Patient has been appropriately anticoagulated with apixaban.  Ordering provider was confirmed at [email or web address] if they are not listed on the [place] Prescribers list.  Goal of Therapy: Follow renal function, electrolytes, potential drug interactions, and dose adjustment. Provide education and 1 week supply at discharge.  Plan:  [x]   Physician selected initial dose within range recommended for patients level of renal function - will monitor for response.  []   Physician selected initial dose outside of range recommended for patients level of renal function - will discuss if the dose should be altered at this time.   Select One Calculated CrCl  Dose q12h  [x]  > 60 ml/min 500 mcg  []  40-60  ml/min 250 mcg  []  20-40 ml/min 125 mcg   2. Follow up QTc after the first 5 doses, renal function, electrolytes (K & Mg) daily x 3     days, dose adjustment, success of initiation and facilitate 1 week discharge supply as     clinically indicated.  3. Initiate Tikosyn education video (Call [phone] and ask for video # [phone]).  4.  Place Enrollment Form on the chart for discharge supply of dofetilide.   Albertina Parr, PharmD., BCPS Clinical Pharmacist Pager [phone]

## 2014-11-12 NOTE — Progress Notes (Signed)
  Echocardiogram 2D Echocardiogram has been performed.  Bobbye Charleston 11/12/2014, 2:41 PM

## 2014-11-12 NOTE — Care Management Important Message (Signed)
Important Message  Patient Details  Name: Melanie Lucas MRN: [id number] Date of Birth: 1950-05-02   Medicare Important Message Given:  Yes-second notification given    Loann Quill 11/12/2014, 10:44 AM

## 2014-11-12 NOTE — Progress Notes (Signed)
The patient refused her TED hose.

## 2014-11-12 NOTE — Care Management Note (Addendum)
Case Management Note  Patient Details  Name: Melanie Lucas MRN: [id number] Date of Birth: Apr 02, 1950  Subjective/Objective:           CM following for progression and d/c planning. Pt will d/c on Tikosyn, po 528mg started today , plan to d/c pt on Sunday.        Pt copay for Tikosyn is $64 per month or $10 per month for generic, pt informed.   Action/Plan: 11/12/2014 Met with pt and Tikosyn discussed, benefit check started, [facility] uses [facility] on [address]. That pharmacy called at [phone] they will order Tikosyn as soon as the pt brings in the prescription and expect delivery with in a day. The pt will d/c with a 7 day supple to be filled by the hospital pharmacy. Sticky note left for MD As a reminder for this 7 day prescription in addition to her ongoing prescription needs. Pt currently on Eliquis, will inquire if pt is to d/c on this drug and provide drugs cards as they apply to this pt's insurance.   Expected Discharge Date:       11/14/2014           Expected Discharge Plan:  [place] Ref[place]:  NA  Discharge planning Services  CM Consult  Post Acute Care Choice:  Home Health Choice offered to:  Patient  DME Arranged:    DME Agency:     HH Arranged:  RN HRolfeAgency:     Status of Service:  In process, will continue to follow  Medicare Important Message Given:  Yes-second notification given Date Medicare IM Given:    Medicare IM give by:    Date Additional Medicare IM Given:    Additional Medicare Important Message give by:     If discussed at [place] Meet[place], dates discussed:    Additional Comments:  RAdron Bene RN 11/12/2014, 12:05 PM

## 2014-11-12 NOTE — Progress Notes (Signed)
SUBJECTIVE: The patient is doing well today.  At this time, she denies chest pain, shortness of breath, or any new concerns.  She is significantly improved post cardioversion.  CURRENT MEDICATIONS: . apixaban  5 mg Oral BID  . dofetilide  500 mcg Oral BID  . furosemide  20 mg Oral Daily  . glimepiride  4 mg Oral Q breakfast  . insulin aspart  0-9 Units Subcutaneous TID WC  . metFORMIN  1,000 mg Oral Q supper  . metoprolol tartrate  25 mg Oral BID  . multivitamin with minerals  1 tablet Oral Daily  . nicotine  14 mg Transdermal Daily  . rosuvastatin  5 mg Oral QHS   . sodium chloride 20 mL/hr (11/10/14 1725)    OBJECTIVE: Physical Exam: Filed Vitals:   11/11/14 1430 11/11/14 2028 11/12/14 0033 11/12/14 0614  BP: 97/60 110/59 126/70 116/68  Pulse: 55 59 62 66  Temp: 98.2 F (36.8 C) 98.9 F (37.2 C) 98.2 F (36.8 C) 98.4 F (36.9 C)  TempSrc:  Oral Oral Oral  Resp: 17 18 18 18   Height:      Weight:    214 lb 6.4 oz (97.251 kg)  SpO2: 96% 95% 100% 99%    Intake/Output Summary (Last 24 hours) at 11/12/14 0830 Last data filed at 11/12/14 0809  Gross per 24 hour  Intake    660 ml  Output   1600 ml  Net   -940 ml    Telemetry reveals sinus rhythm  GEN- The patient is well appearing, alert and oriented x 3 today.   Head- normocephalic, atraumatic Eyes-  Sclera clear, conjunctiva pink Ears- hearing intact Oropharynx- clear Neck- supple Lungs- Clear to ausculation bilaterally, normal work of breathing Heart- Regular rate and rhythm GI- soft, NT, ND, + BS Extremities- no clubbing, cyanosis, or edema Skin- no rash or lesion Psych- euthymic mood, full affect Neuro- strength and sensation are intact  LABS: Basic Metabolic Panel:  Recent Labs  11/09/14 1839 11/11/14 1057  NA 140 140  K 3.9 4.0  CL 105 105  CO2 26 26  GLUCOSE 238* 193*  BUN 12 7  CREATININE 0.86 0.70  CALCIUM 8.8* 9.1  MG 1.9 2.0   Liver Function Tests:  Recent Labs   11/09/14 1839  AST 44*  ALT 47  ALKPHOS 84  BILITOT 0.7  PROT 5.8*  ALBUMIN 3.1*   CBC:  Recent Labs  11/09/14 1839  WBC 6.9  NEUTROABS 4.3  HGB 14.0  HCT 40.3  MCV 91.6  PLT 180   Cardiac Enzymes:  Recent Labs  11/09/14 1839 11/09/14 2332 11/10/14 0520  TROPONINI <0.03 <0.03 <0.03   Thyroid Function Tests:  Recent Labs  11/09/14 1839  TSH 2.619    RADIOLOGY: X-ray Chest Pa And Lateral 11/09/2014   CLINICAL DATA:  64 year old female currently admitted with atrial fibrillation with rapid ventricular response  EXAM: CHEST  2 VIEW  COMPARISON:  Prior chest x-ray 11/10/2012  FINDINGS: Borderline cardiomegaly. Trace atherosclerotic calcification in the transverse aorta. Pulmonary vascular congestion bordering on mild interstitial edema. No focal airspace consolidation, pleural effusion or pneumothorax. Diffuse central bronchitic change. No acute osseous abnormality.  IMPRESSION: 1. Pulmonary vascular congestion bordering on mild interstitial edema. 2. Stable borderline cardiomegaly. 3. Aortic atherosclerosis.   Electronically Signed   By: Jacqulynn Cadet M.D.   On: 11/09/2014 19:43    ASSESSMENT AND PLAN:  Principal Problem:   Atrial fibrillation with RVR Active Problems:   TOBACCO  ABUSE   Essential hypertension, benign   CAD (coronary artery disease)   HLD (hyperlipidemia)   Diabetes mellitus   1. Persistent atrial fibrillation The patient has persistent atrial fibrillation with RVR and has failed Multaq. S/p TEE/DCCV 11/11/14 Continue Eliquis long term for CHADS2VASC of at least 7 Keep K >3.9, Mg >1.8 Start Tikosyn 532mcg twice daily today - would anticipate discharge Sunday morning. The importance of compliance was again discussed with the patient today Repeat echo ordered today now that in SR   2.  Cardiomyopathy EF 35-40% by TEE yesterday Will repeat echo as above If EF depressed, would change Metoprolol to Coreg and add ACE-I with repeat echo in 3  months - this is likely a tachycardia mediated cardiomyopathy  3. Obesity Weight loss encouraged  4. HTN Stable No change required today  5. CAD No recent ischemic symptoms   Follow up in AF clinic in 1 week and 4 weeks. Follow up with Dr Rayann Heman in 3 months.   Chanetta Marshall, NP 11/12/2014 8:36 AM  EP attending  Patient seen and examined. Agree with the findings as noted above by Chanetta Marshall, NP. She is tolerating Tiksoyn and is maintaining NSR. She will be observed on tele for 3 days in the hospital.  Mikle Bosworth.D.

## 2014-11-13 LAB — BASIC METABOLIC PANEL
Anion gap: 8 (ref 5–15)
BUN: 12 mg/dL (ref 6–20)
CALCIUM: 8.9 mg/dL (ref 8.9–10.3)
CO2: 31 mmol/L (ref 22–32)
Chloride: 104 mmol/L (ref 101–111)
Creatinine, Ser: 0.84 mg/dL (ref 0.44–1.00)
GFR calc Af Amer: >60 mL/min (ref >60)
GLUCOSE: 152 mg/dL — AB (ref 65–99)
Potassium: 3.8 mmol/L (ref 3.5–5.1)
Sodium: 143 mmol/L (ref 135–145)

## 2014-11-13 LAB — GLUCOSE, CAPILLARY
GLUCOSE-CAPILLARY: 223 mg/dL — AB (ref 65–99)
GLUCOSE-CAPILLARY: 155 mg/dL — AB (ref 65–99)
Glucose-Capillary: 135 mg/dL — ABNORMAL HIGH (ref 65–99)
Glucose-Capillary: 188 mg/dL — ABNORMAL HIGH (ref 65–99)

## 2014-11-13 LAB — MAGNESIUM: Magnesium: 2 mg/dL (ref 1.7–2.4)

## 2014-11-13 MED ORDER — CHLORHEXIDINE GLUCONATE 0.12 % MT SOLN: 15.0000 mL | Freq: Two times a day (BID) | OROMUCOSAL | Status: DC

## 2014-11-13 MED ORDER — POTASSIUM CHLORIDE CRYS ER 20 MEQ PO TBCR
40.0000 meq | EXTENDED_RELEASE_TABLET | Freq: Once | ORAL | Status: AC
  Administered 2014-11-13: 40 meq via ORAL
  Filled 2014-11-13: qty 2

## 2014-11-13 MED ORDER — CETYLPYRIDINIUM CHLORIDE 0.05 % MT LIQD
7.0000 mL | Freq: Two times a day (BID) | OROMUCOSAL | Status: DC
Start: 2014-11-13 — End: 2014-11-14
  Administered 2014-11-13: 7 mL via OROMUCOSAL

## 2014-11-13 NOTE — Progress Notes (Addendum)
    S:  CTSP secondary to asymptomatic sinus bradycardia.  Pt admitted with AF and is s/p TEE/DCCV on 9/22.  She is being loaded with tikosyn as well.  Post-cardioversion, she ha been noted to have sinus brady and jxnl brady with rates dipping into the 30's at times.  Following metoprolol 25mg  this AM, her HR's have been in the mid-high 30's to 40's.  She is asymptomatic and hemodynamically stable.  O:   Filed Vitals:   11/13/14 1216  BP: 132/70  Pulse: 42  Temp: 97.9 F (36.6 C)  Resp: 18   Pleasant, NAD, AAOX3.  Cor RRR, Lungs CTA.  ECG: sinus brady, 46, atrial bigeminy, QTc = 404 (sinus beats only), 489 (average of sinus beats and pac's/atrial bigeminy) A/P:  1.  Sinus bradycardia/PAF:  S/p TEE/DCCV on 9/22 for AFib.  Tolerating tikosyn loading well however has been having pretty significant sinus bradycardia with intermittent jxnl brady on tele.  I will stop metoprolol therapy.  Cont tikosyn.  Follow tele.  Still plan for d/c following sixth dose of tikosyn on Monday AM if rates stable.  Murray Hodgkins, NP

## 2014-11-13 NOTE — Progress Notes (Signed)
Patient ID: Melanie Lucas, female   DOB: [birth year], 64 y.o.   MRN: [id number]    Patient Name: Melanie Lucas Date of Encounter: 11/13/2014     Principal Problem:   Atrial fibrillation with RVR Active Problems:   TOBACCO ABUSE   Essential hypertension, benign   CAD (coronary artery disease)   HLD (hyperlipidemia)   Diabetes mellitus    SUBJECTIVE  No new complaints. Anxious and would like to be at home.  CURRENT MEDS . apixaban  5 mg Oral BID  . dofetilide  500 mcg Oral BID  . furosemide  20 mg Oral Daily  . glimepiride  4 mg Oral Q breakfast  . insulin aspart  0-9 Units Subcutaneous TID WC  . metFORMIN  1,000 mg Oral Q supper  . metoprolol tartrate  25 mg Oral BID  . multivitamin with minerals  1 tablet Oral Daily  . nicotine  14 mg Transdermal Daily  . rosuvastatin  5 mg Oral QHS    OBJECTIVE  Filed Vitals:   11/12/14 1217 11/12/14 1633 11/12/14 2055 11/13/14 0457  BP: 120/58 113/58 133/83 151/76  Pulse: 56 50 59 57  Temp: 98.7 F (37.1 C)  97.8 F (36.6 C) 98 F (36.7 C)  TempSrc: Oral  Oral Oral  Resp: 18 18 18 18   Height:      Weight:    213 lb 14.4 oz (97.024 kg)  SpO2: 98% 100% 100% 96%    Intake/Output Summary (Last 24 hours) at 11/13/14 1020 Last data filed at 11/13/14 0654  Gross per 24 hour  Intake    580 ml  Output   1450 ml  Net   -870 ml   Filed Weights   11/11/14 0509 11/12/14 0614 11/13/14 0457  Weight: 214 lb 6.4 oz (97.251 kg) 214 lb 6.4 oz (97.251 kg) 213 lb 14.4 oz (97.024 kg)    PHYSICAL EXAM  General: Pleasant, NAD. Neuro: Alert and oriented X 3. Moves all extremities spontaneously. Psych: Normal affect. HEENT:  Normal  Neck: Supple without bruits or JVD. Lungs:  Resp regular and unlabored, CTA. Heart: RRR no s3, s4, or murmurs. Abdomen: Soft, non-tender, non-distended, BS + x 4.  Extremities: No clubbing, cyanosis or edema. DP/PT/Radials 2+ and equal bilaterally.  Accessory Clinical Findings  CBC No results for  input(s): WBC, NEUTROABS, HGB, HCT, MCV, PLT in the last 72 hours. Basic Metabolic Panel  Recent Labs  11/11/14 1057 11/13/14 0350  NA 140 143  K 4.0 3.8  CL 105 104  CO2 26 31  GLUCOSE 193* 152*  BUN 7 12  CREATININE 0.70 0.84  CALCIUM 9.1 8.9  MG 2.0 2.0   Liver Function Tests No results for input(s): AST, ALT, ALKPHOS, BILITOT, PROT, ALBUMIN in the last 72 hours. No results for input(s): LIPASE, AMYLASE in the last 72 hours. Cardiac Enzymes No results for input(s): CKTOTAL, CKMB, CKMBINDEX, TROPONINI in the last 72 hours. BNP Invalid input(s): POCBNP D-Dimer No results for input(s): DDIMER in the last 72 hours. Hemoglobin A1C No results for input(s): HGBA1C in the last 72 hours. Fasting Lipid Panel No results for input(s): CHOL, HDL, LDLCALC, TRIG, CHOLHDL, LDLDIRECT in the last 72 hours. Thyroid Function Tests No results for input(s): TSH, T4TOTAL, T3FREE, THYROIDAB in the last 72 hours.  Invalid input(s): FREET3  TELE  NSR  ECG - NSR with QTC 450  Radiology/Studies  X-ray Chest Pa And Lateral  11/09/2014   CLINICAL DATA:  64 year old female currently admitted with atrial  fibrillation with rapid ventricular response  EXAM: CHEST  2 VIEW  COMPARISON:  Prior chest x-ray 11/10/2012  FINDINGS: Borderline cardiomegaly. Trace atherosclerotic calcification in the transverse aorta. Pulmonary vascular congestion bordering on mild interstitial edema. No focal airspace consolidation, pleural effusion or pneumothorax. Diffuse central bronchitic change. No acute osseous abnormality.  IMPRESSION: 1. Pulmonary vascular congestion bordering on mild interstitial edema. 2. Stable borderline cardiomegaly. 3. Aortic atherosclerosis.   Electronically Signed   By: Jacqulynn Cadet M.D.   On: 11/09/2014 19:43    ASSESSMENT AND PLAN  1. Atrial fib with an RVR 2. Initiation of Tikosyn with stable QT Rec: continue Tikosyn today and tomorrow and dc home on Monday morning. Follow  QT.  Gregg Taylor,M.D.  11/13/2014 10:20 AM

## 2014-11-13 NOTE — Progress Notes (Signed)
At 1200 pt's  SB,  HR 33.  Pt asymptomatic.  Bp133/63, P 66 via dinamap machine.  Notified Angelica Ran, NP and instructed that he will be up on the floor to see pt.  EKG obtained - SB 46.  Will continue to monitor.  Karie Kirks, Therapist, sports.

## 2014-11-14 ENCOUNTER — Encounter (HOSPITAL_COMMUNITY): Payer: Self-pay | Admitting: Nurse Practitioner

## 2014-11-14 ENCOUNTER — Other Ambulatory Visit: Payer: Self-pay

## 2014-11-14 LAB — BASIC METABOLIC PANEL
ANION GAP: 8 (ref 5–15)
BUN: 15 mg/dL (ref 6–20)
CHLORIDE: 101 mmol/L (ref 101–111)
CO2: 29 mmol/L (ref 22–32)
Calcium: 8.8 mg/dL — ABNORMAL LOW (ref 8.9–10.3)
Creatinine, Ser: 0.8 mg/dL (ref 0.44–1.00)
GFR calc non Af Amer: >60 mL/min (ref >60)
Glucose, Bld: 197 mg/dL — ABNORMAL HIGH (ref 65–99)
Potassium: 3.9 mmol/L (ref 3.5–5.1)
Sodium: 138 mmol/L (ref 135–145)

## 2014-11-14 LAB — GLUCOSE, CAPILLARY
GLUCOSE-CAPILLARY: 206 mg/dL — AB (ref 65–99)
Glucose-Capillary: 153 mg/dL — ABNORMAL HIGH (ref 65–99)

## 2014-11-14 LAB — MAGNESIUM: Magnesium: 2 mg/dL (ref 1.7–2.4)

## 2014-11-14 MED ORDER — POTASSIUM CHLORIDE CRYS ER 20 MEQ PO TBCR: 40.0000 meq | EXTENDED_RELEASE_TABLET | Freq: Once | ORAL | Status: DC

## 2014-11-14 MED ORDER — POTASSIUM CHLORIDE CRYS ER 20 MEQ PO TBCR: 20.0000 meq | EXTENDED_RELEASE_TABLET | Freq: Every day | ORAL | Status: DC

## 2014-11-14 MED ORDER — DOFETILIDE 500 MCG PO CAPS: 500.0000 ug | ORAL_CAPSULE | Freq: Two times a day (BID) | ORAL | Status: DC

## 2014-11-14 MED ORDER — APIXABAN 5 MG PO TABS: 5.0000 mg | ORAL_TABLET | Freq: Two times a day (BID) | ORAL | Status: DC

## 2014-11-14 NOTE — Progress Notes (Signed)
Discharge Summary   Patient ID: Melanie Lucas,  MRN: [id number], DOB/AGE: [birth year] 64 y.o.  Admit date: 11/09/2014 Discharge date: 11/14/2014  Primary Care Provider: Tawanna Solo Primary Cardiologist: Liane Comber, MD / J. Allred, MD   Discharge Diagnoses Principal Problem:   Atrial fibrillation with RVR  **Dronedarone d/c'd this admission.  **Eliquis and tikosyn initiated this admission.  **Discharge QTc = 460 msec.  Active Problems:   Overweight   TOBACCO ABUSE   CAD (coronary artery disease)   Diabetes mellitus   Essential hypertension, benign   HLD (hyperlipidemia)  Allergies Allergies  Allergen Reactions  . Statins Other (See Comments)    Muscle pain  . Sulfonamide Derivatives Hives    Nausea vertigo  . Tape Itching and Rash    Please use "paper" tape only    Procedures  2D Echocardiogram 9.23.2016  Study Conclusions  - Left ventricle: The cavity size was normal. Systolic function was normal. The estimated ejection fraction was in the range of 50% to 55%. Wall motion was normal; there were no regional wall motion abnormalities. The study was not technically sufficient to allow evaluation of LV diastolic dysfunction due to atrial fibrillation. - Aortic valve: Trileaflet; normal thickness leaflets. There was no regurgitation. - Aortic root: The aortic root was normal in size. - Ascending aorta: The ascending aorta was normal in size. - Mitral valve: Structurally normal valve. There was mild regurgitation. - Left atrium: The atrium was normal in size. - Right ventricle: The cavity size was normal. Wall thickness was normal. Systolic function was normal. - Tricuspid valve: There was mild-moderate regurgitation. - Pulmonary arteries: Systolic pressure was mildly increased. PA peak pressure: 37 mm Hg (S). - Inferior vena cava: The vessel was normal in size. - Pericardium, extracardiac: There was no pericardial effusion. _____________    History of Present Illness  64 y/o female with a h/o PAF previously managed with dronedarone, lacunar infarct, HTN, DM, CAD, and tobacco abuse.  She was recently seen in the office on 9/20 and was noted to be back in atrial fibrillation with RVR. Decision was made to admit her for further evaluation and eliquis initiation followed by tikosyn loading.  Hospital Course  Following admission, pt was placed on IV diltitazem and dronedarone was discontinued.  Eliquis was started with a plan to perfom TEE and cardioversion after she had received 5 doses of eliquis.  Rates were relatively stable and troponins were normal.  She underwent TEE on 9/23 revealing no evidence of LAA thrombus.  This was followed by DCCV using one synchronized shock at 200 joules with successful conversion to sinus bradycardia with intermittent junctional rhythm.  Following cardioversion, she was loaded with Tikosyn 500 mcg q12 hrs.  She has tolerated tikosyn loading well and has not had any further atrial fibrillation.  Her QTc has remained relatively stable and is 460 msec this AM.  She has had intermittent asymptomatic sinus and junctional bradycardia with rates dipping into the 30's at times.  As a result, we have discontinued daily beta blocker therapy, though she will continue to use propranolol as needed for tremors.  She will be discharged home today in good condition.  She has f/u on 10/3 in Afib clinic at which time she will require a repeat BMET, Mg, and 12 lead ECG to assess her QTc.  Discharge Vitals Blood pressure 159/76, pulse 60, temperature 98.2 F (36.8 C), temperature source Oral, resp. rate 18, height 5\' 7"  (1.702 m), weight 214 lb 8 oz (97.297  kg), SpO2 94 %.  Filed Weights   11/12/14 0614 11/13/14 0457 11/14/14 0611  Weight: 214 lb 6.4 oz (97.251 kg) 213 lb 14.4 oz (97.024 kg) 214 lb 8 oz (97.297 kg)    Labs  CBC Lab Results  Component Value Date   WBC 6.9 11/09/2014   HGB 14.0 11/09/2014   HCT 40.3  11/09/2014   MCV 91.6 11/09/2014   PLT 180 52/77/8242    Basic Metabolic Panel  Recent Labs  11/13/14 0350 11/14/14 0314  NA 143 138  K 3.8 3.9  CL 104 101  CO2 31 29  GLUCOSE 152* 197*  BUN 12 15  CREATININE 0.84 0.80  CALCIUM 8.9 8.8*  MG 2.0 2.0   Disposition  Pt is being discharged home today in good condition.  Follow-up Plans & Appointments  Follow-up Information    Follow up with [facility] On 11/22/2014.   Why:  at 1:30PM   Contact information:   [place] [phone]      Follow up with [facility] On 12/13/2014.   Why:  at 9:30AM   Contact information:   [place] [phone]      Follow up with Thompson Grayer, MD On 02/17/2015.   Specialty:  Cardiology   Why:  at 12:15PM   Contact information:   [place] [address] [phone]       Discharge Medications    Medication List    STOP taking these medications        aspirin 325 MG tablet     diltiazem 120 MG 24 hr capsule  Commonly known as:  CARDIZEM CD     dronedarone 400 MG tablet  Commonly known as:  MULTAQ      TAKE these medications        albuterol 108 (90 BASE) MCG/ACT inhaler  Commonly known as:  PROVENTIL HFA;VENTOLIN HFA  Inhale into the lungs every 6 (six) hours as needed for wheezing or shortness of breath.     apixaban 5 MG Tabs tablet  Commonly known as:  ELIQUIS  Take 1 tablet (5 mg total) by mouth 2 (two) times daily.     CALCIUM PO  Take 1,000 mg by mouth daily.     diazepam 2 MG tablet  Commonly known as:  VALIUM  Take 2 mg by mouth daily as needed for anxiety.     dofetilide 500 MCG capsule  Commonly known as:  TIKOSYN  Take 1 capsule (500 mcg total) by mouth 2 (two) times daily.     furosemide 20 MG tablet  Commonly known as:  LASIX  Take 20 mg by mouth daily.     glimepiride 4 MG tablet  Commonly known as:  AMARYL  Take 4  mg by mouth at bedtime.     ibuprofen 800 MG tablet  Commonly known as:  ADVIL,MOTRIN  Take 800 mg by mouth daily as needed for headache or mild pain.     metFORMIN 1000 MG tablet  Commonly known as:  GLUCOPHAGE  Take 1,000 mg by mouth at bedtime.     multivitamin tablet  Take 1 tablet by mouth daily.     potassium chloride SA 20 MEQ tablet  Commonly known as:  K-DUR,KLOR-CON  Take 1 tablet (20 mEq total) by mouth daily.     propranolol 20 MG tablet  Commonly known as:  INDERAL  Take 20 mg  by mouth 2 (two) times daily as needed (for shaking).     rosuvastatin 5 MG tablet  Commonly known as:  CRESTOR  Take 5 mg by mouth at bedtime.     tiotropium 18 MCG inhalation capsule  Commonly known as:  SPIRIVA  Place 18 mcg into inhaler and inhale daily as needed (only when she feels short of breath).     VALTREX 500 MG tablet  Generic drug:  valACYclovir  Take 500 mg by mouth 2 (two) times daily as needed (for outbreaks).        Outstanding Labs/Studies  F/U BMET, Mg, and ECG @ office f/u on 10/3.  Duration of Discharge Encounter   Greater than 30 minutes including physician time.  Signed, Murray Hodgkins NP 11/14/2014, 1:46 PM    EP Attending  Patient seen and examined., Her repeat 12 lead ECG demonstrates a QTC of 460-470. She is stable for discharge. Followup as noted above.  Mikle Bosworth.D.

## 2014-11-14 NOTE — Progress Notes (Signed)
Patient ID: Melanie Lucas, female   DOB: Feb 10, 1951, 64 y.o.   MRN: [id number]    Patient Name: Melanie Lucas Date of Encounter: 11/14/2014     Principal Problem:   Atrial fibrillation with RVR Active Problems:   TOBACCO ABUSE   Essential hypertension, benign   CAD (coronary artery disease)   HLD (hyperlipidemia)   Diabetes mellitus    SUBJECTIVE  Anxious to go home. No chest pain or sob. No palpitations.  CURRENT MEDS . apixaban  5 mg Oral BID  . dofetilide  500 mcg Oral BID  . furosemide  20 mg Oral Daily  . glimepiride  4 mg Oral Q breakfast  . insulin aspart  0-9 Units Subcutaneous TID WC  . metFORMIN  1,000 mg Oral Q supper  . multivitamin with minerals  1 tablet Oral Daily  . nicotine  14 mg Transdermal Daily  . rosuvastatin  5 mg Oral QHS    OBJECTIVE  Filed Vitals:   11/13/14 1023 11/13/14 1216 11/13/14 1953 11/14/14 0611  BP: 145/66 132/70 147/67 159/76  Pulse: 58 42 58 60  Temp:  97.9 F (36.6 C) 98.2 F (36.8 C) 98.2 F (36.8 C)  TempSrc:  Oral Oral Oral  Resp:  18 18 18   Height:      Weight:    214 lb 8 oz (97.297 kg)  SpO2:  100% 100% 94%    Intake/Output Summary (Last 24 hours) at 11/14/14 1051 Last data filed at 11/14/14 0657  Gross per 24 hour  Intake    340 ml  Output   1350 ml  Net  -1010 ml   Filed Weights   11/12/14 0614 11/13/14 0457 11/14/14 0611  Weight: 214 lb 6.4 oz (97.251 kg) 213 lb 14.4 oz (97.024 kg) 214 lb 8 oz (97.297 kg)    PHYSICAL EXAM  General: Pleasant, NAD. Neuro: Alert and oriented X 3. Moves all extremities spontaneously. Psych: Normal affect. HEENT:  Normal  Neck: Supple without bruits or JVD. Lungs:  Resp regular and unlabored, CTA. Heart: RRR no s3, s4, or murmurs. Abdomen: Soft, non-tender, non-distended, BS + x 4.  Extremities: No clubbing, cyanosis or edema. DP/PT/Radials 2+ and equal bilaterally.  Accessory Clinical Findings  CBC No results for input(s): WBC, NEUTROABS, HGB, HCT, MCV, PLT in the  last 72 hours. Basic Metabolic Panel  Recent Labs  11/13/14 0350 11/14/14 0314  NA 143 138  K 3.8 3.9  CL 104 101  CO2 31 29  GLUCOSE 152* 197*  BUN 12 15  CREATININE 0.84 0.80  CALCIUM 8.9 8.8*  MG 2.0 2.0   Liver Function Tests No results for input(s): AST, ALT, ALKPHOS, BILITOT, PROT, ALBUMIN in the last 72 hours. No results for input(s): LIPASE, AMYLASE in the last 72 hours. Cardiac Enzymes No results for input(s): CKTOTAL, CKMB, CKMBINDEX, TROPONINI in the last 72 hours. BNP Invalid input(s): POCBNP D-Dimer No results for input(s): DDIMER in the last 72 hours. Hemoglobin A1C No results for input(s): HGBA1C in the last 72 hours. Fasting Lipid Panel No results for input(s): CHOL, HDL, LDLCALC, TRIG, CHOLHDL, LDLDIRECT in the last 72 hours. Thyroid Function Tests No results for input(s): TSH, T4TOTAL, T3FREE, THYROIDAB in the last 72 hours.  Invalid input(s): FREET3  TELE  nsr with no QT prolongation  ECG  nsr with no QT prolongation  Radiology/Studies  X-ray Chest Pa And Lateral  11/09/2014   CLINICAL DATA:  64 year old female currently admitted with atrial fibrillation with rapid ventricular response  EXAM: CHEST  2 VIEW  COMPARISON:  Prior chest x-ray 11/10/2012  FINDINGS: Borderline cardiomegaly. Trace atherosclerotic calcification in the transverse aorta. Pulmonary vascular congestion bordering on mild interstitial edema. No focal airspace consolidation, pleural effusion or pneumothorax. Diffuse central bronchitic change. No acute osseous abnormality.  IMPRESSION: 1. Pulmonary vascular congestion bordering on mild interstitial edema. 2. Stable borderline cardiomegaly. 3. Aortic atherosclerosis.   Electronically Signed   By: Jacqulynn Cadet M.D.   On: 11/09/2014 19:43    ASSESSMENT AND PLAN  1. Atrial fib with a RVR 2. S/p initiation of Tikosyn Rec: she is currently receiving her 5 th dose. IF ECG ok 2 hours after, will plan to discharge this afternoon. I  would like her to return for a nurse visit and an ECG on a week and BMP and a Mg level at that time.   Gregg Taylor,M.D.  11/14/2014 10:51 AM

## 2014-11-14 NOTE — Discharge Summary (Signed)
Discharge Summary   Patient ID: Melanie Lucas,  MRN: [id number], DOB/AGE: Feb 13, 1951 64 y.o.  Admit date: 11/09/2014 Discharge date: 11/14/2014  Primary Care Provider: Tawanna Solo Primary Cardiologist: Liane Comber, MD / J. Allred, MD   Discharge Diagnoses Principal Problem:  Atrial fibrillation with RVR **Dronedarone d/c'd this admission. **Eliquis and tikosyn initiated this admission. **Discharge QTc = 460 msec.  Active Problems:  Overweight  TOBACCO ABUSE  CAD (coronary artery disease)  Diabetes mellitus  Essential hypertension, benign  HLD (hyperlipidemia)  Allergies Allergies  Allergen Reactions  . Statins Other (See Comments)    Muscle pain  . Sulfonamide Derivatives Hives    Nausea vertigo  . Tape Itching and Rash    Please use "paper" tape only    Procedures  2D Echocardiogram 9.23.2016  Study Conclusions  - Left ventricle: The cavity size was normal. Systolic function was normal. The estimated ejection fraction was in the range of 50% to 55%. Wall motion was normal; there were no regional wall motion abnormalities. The study was not technically sufficient to allow evaluation of LV diastolic dysfunction due to atrial fibrillation. - Aortic valve: Trileaflet; normal thickness leaflets. There was no regurgitation. - Aortic root: The aortic root was normal in size. - Ascending aorta: The ascending aorta was normal in size. - Mitral valve: Structurally normal valve. There was mild regurgitation. - Left atrium: The atrium was normal in size. - Right ventricle: The cavity size was normal. Wall thickness was normal. Systolic function was normal. - Tricuspid valve: There was mild-moderate regurgitation. - Pulmonary arteries: Systolic pressure was mildly increased. PA peak pressure: 37 mm Hg (S). - Inferior vena cava: The vessel was normal in size. - Pericardium,  extracardiac: There was no pericardial effusion. _____________   History of Present Illness  64 y/o female with a h/o PAF previously managed with dronedarone, lacunar infarct, HTN, DM, CAD, and tobacco abuse. She was recently seen in the office on 9/20 and was noted to be back in atrial fibrillation with RVR. Decision was made to admit her for further evaluation and eliquis initiation followed by tikosyn loading.  Hospital Course  Following admission, pt was placed on IV diltitazem and dronedarone was discontinued. Eliquis was started with a plan to perfom TEE and cardioversion after she had received 5 doses of eliquis. Rates were relatively stable and troponins were normal. She underwent TEE on 9/23 revealing no evidence of LAA thrombus. This was followed by DCCV using one synchronized shock at 200 joules with successful conversion to sinus bradycardia with intermittent junctional rhythm. Following cardioversion, she was loaded with Tikosyn 500 mcg q12 hrs. She has tolerated tikosyn loading well and has not had any further atrial fibrillation. Her QTc has remained relatively stable and is 460 msec this AM. She has had intermittent asymptomatic sinus and junctional bradycardia with rates dipping into the 30's at times. As a result, we have discontinued daily beta blocker therapy, though she will continue to use propranolol as needed for tremors. She will be discharged home today in good condition. She has f/u on 10/3 in Afib clinic at which time she will require a repeat BMET, Mg, and 12 lead ECG to assess her QTc.  Discharge Vitals Blood pressure 159/76, pulse 60, temperature 98.2 F (36.8 C), temperature source Oral, resp. rate 18, height 5\' 7"  (1.702 m), weight 214 lb 8 oz (97.297 kg), SpO2 94 %.  Filed Weights   11/12/14 0614 11/13/14 0457 11/14/14 0611  Weight: 214 lb 6.4  oz (97.251 kg) 213 lb 14.4 oz (97.024 kg) 214 lb 8 oz (97.297 kg)    Labs  CBC  Recent Labs     Lab Results  Component Value Date   WBC 6.9 11/09/2014   HGB 14.0 11/09/2014   HCT 40.3 11/09/2014   MCV 91.6 11/09/2014   PLT 180 11/09/2014      Basic Metabolic Panel  Recent Labs (last 2 labs)      Recent Labs  11/13/14 0350 11/14/14 0314  NA 143 138  K 3.8 3.9  CL 104 101  CO2 31 29  GLUCOSE 152* 197*  BUN 12 15  CREATININE 0.84 0.80  CALCIUM 8.9 8.8*  MG 2.0 2.0     Disposition  Pt is being discharged home today in good condition.  Follow-up Plans & Appointments  Follow-up Information    Follow up with [facility] On 11/22/2014.   Why: at 1:30PM   Contact information:   [place] [phone]      Follow up with [facility] On 12/13/2014.   Why: at 9:30AM   Contact information:   [place] [phone]      Follow up with Thompson Grayer, MD On 02/17/2015.   Specialty: Cardiology   Why: at 12:15PM   Contact information:   [place] [address] [phone]       Discharge Medications    Medication List    STOP taking these medications       aspirin 325 MG tablet     diltiazem 120 MG 24 hr capsule  Commonly known as: CARDIZEM CD     dronedarone 400 MG tablet  Commonly known as: MULTAQ      TAKE these medications       albuterol 108 (90 BASE) MCG/ACT inhaler  Commonly known as: PROVENTIL HFA;VENTOLIN HFA  Inhale into the lungs every 6 (six) hours as needed for wheezing or shortness of breath.     apixaban 5 MG Tabs tablet  Commonly known as: ELIQUIS  Take 1 tablet (5 mg total) by mouth 2 (two) times daily.     CALCIUM PO  Take 1,000 mg by mouth daily.     diazepam 2 MG tablet  Commonly known as: VALIUM  Take 2 mg by mouth daily as needed for anxiety.     dofetilide  500 MCG capsule  Commonly known as: TIKOSYN  Take 1 capsule (500 mcg total) by mouth 2 (two) times daily.     furosemide 20 MG tablet  Commonly known as: LASIX  Take 20 mg by mouth daily.     glimepiride 4 MG tablet  Commonly known as: AMARYL  Take 4 mg by mouth at bedtime.     ibuprofen 800 MG tablet  Commonly known as: ADVIL,MOTRIN  Take 800 mg by mouth daily as needed for headache or mild pain.     metFORMIN 1000 MG tablet  Commonly known as: GLUCOPHAGE  Take 1,000 mg by mouth at bedtime.     multivitamin tablet  Take 1 tablet by mouth daily.     potassium chloride SA 20 MEQ tablet  Commonly known as: K-DUR,KLOR-CON  Take 1 tablet (20 mEq total) by mouth daily.     propranolol 20 MG tablet  Commonly known as: INDERAL  Take 20 mg by mouth 2 (two) times daily as needed (for shaking).     rosuvastatin 5 MG tablet  Commonly known as: CRESTOR  Take 5 mg by mouth at bedtime.     tiotropium 18 MCG inhalation capsule  Commonly known as: SPIRIVA  Place 18 mcg into inhaler and inhale daily as needed (only when she feels short of breath).     VALTREX 500 MG tablet  Generic drug: valACYclovir  Take 500 mg by mouth 2 (two) times daily as needed (for outbreaks).        Outstanding Labs/Studies  F/U BMET, Mg, and ECG @ office f/u on 10/3.  Duration of Discharge Encounter   Greater than 30 minutes including physician time.  Signed, Murray Hodgkins NP 11/14/2014, 1:46 PM    EP Attending  Patient seen and examined., Her repeat 12 lead ECG demonstrates a QTC of 460-470. She is stable for discharge. Followup as noted above.  Ponciano Ort.          EP Attending  See above note.   Mikle Bosworth.D.

## 2014-11-14 NOTE — Progress Notes (Signed)
CM met with pt in room and gave pt free 30 day eliquis card.  Pt verbalizes understanding this will give time for medication to be authorized by insurance for refills.  Pt has Tikosyn from our internal pharmacy.  No other CM needs were communicated.

## 2014-11-14 NOTE — Discharge Instructions (Signed)
**  PLEASE REMEMBER TO BRING ALL OF YOUR MEDICATIONS TO EACH OF YOUR FOLLOW-UP OFFICE VISITS.  You have an appointment set up with the [facility].  Multiple studies have shown that being followed by a dedicated atrial fibrillation clinic in addition to the standard care you receive from your other physicians improves health. We believe that enrollment in the atrial fibrillation clinic will allow [place] to better care for you.   The phone number to the [facility] is [phone]. The clinic is staffed Monday through Friday from 8:30am to 5pm.  Parking Directions: The clinic is located in the Heart and Vascular Building connected to [facility]. 1)From [address] turn on to [address] and go to the 3rd entrance  (Heart and Vascular entrance) on the right. 2)Look to the right for Heart &Vascular Parking Garage. 3)A code for the entrance is required please call the clinic to receive this.   4)Take the elevators to the 1st floor. Registration is in the room with the glass walls at the end of the hallway.  If you have any trouble parking or locating the clinic, please dont hesitate to call [phone].

## 2014-11-15 ENCOUNTER — Telehealth: Payer: Self-pay

## 2014-11-15 NOTE — Telephone Encounter (Signed)
Prior auth for Eliquis faxed to [facility].

## 2014-11-16 ENCOUNTER — Ambulatory Visit: Payer: Medicare Other | Admitting: Physician Assistant

## 2014-11-16 ENCOUNTER — Telehealth: Payer: Self-pay

## 2014-11-16 NOTE — Telephone Encounter (Signed)
Eliquis 5mg  approved for one year. [id number]. Pharmacy notified.

## 2014-11-16 NOTE — Telephone Encounter (Signed)
Opened in error

## 2014-11-22 ENCOUNTER — Encounter (HOSPITAL_COMMUNITY): Payer: Self-pay | Admitting: Nurse Practitioner

## 2014-11-22 ENCOUNTER — Ambulatory Visit (HOSPITAL_COMMUNITY)
Admit: 2014-11-22 | Discharge: 2014-11-22 | Disposition: A | Discharge status: Home / Self Care | Payer: Medicare Other | Source: Ambulatory Visit | Attending: Nurse Practitioner | Admitting: Nurse Practitioner

## 2014-11-22 VITALS — BP 146/78 | HR 47 | Ht 67.0 in | Wt 208.8 lb

## 2014-11-22 DIAGNOSIS — J449 Chronic obstructive pulmonary disease, unspecified: Secondary | ICD-10-CM | POA: Insufficient documentation

## 2014-11-22 DIAGNOSIS — Z882 Allergy status to sulfonamides status: Secondary | ICD-10-CM | POA: Insufficient documentation

## 2014-11-22 DIAGNOSIS — I10 Essential (primary) hypertension: Secondary | ICD-10-CM | POA: Diagnosis not present

## 2014-11-22 DIAGNOSIS — Z7902 Long term (current) use of antithrombotics/antiplatelets: Secondary | ICD-10-CM | POA: Insufficient documentation

## 2014-11-22 DIAGNOSIS — E669 Obesity, unspecified: Secondary | ICD-10-CM | POA: Diagnosis not present

## 2014-11-22 DIAGNOSIS — Z79899 Other long term (current) drug therapy: Secondary | ICD-10-CM | POA: Insufficient documentation

## 2014-11-22 DIAGNOSIS — E785 Hyperlipidemia, unspecified: Secondary | ICD-10-CM | POA: Insufficient documentation

## 2014-11-22 DIAGNOSIS — I2510 Atherosclerotic heart disease of native coronary artery without angina pectoris: Secondary | ICD-10-CM | POA: Insufficient documentation

## 2014-11-22 DIAGNOSIS — E119 Type 2 diabetes mellitus without complications: Secondary | ICD-10-CM | POA: Insufficient documentation

## 2014-11-22 DIAGNOSIS — I4891 Unspecified atrial fibrillation: Secondary | ICD-10-CM | POA: Diagnosis present

## 2014-11-22 DIAGNOSIS — Z8249 Family history of ischemic heart disease and other diseases of the circulatory system: Secondary | ICD-10-CM | POA: Diagnosis not present

## 2014-11-22 DIAGNOSIS — F17210 Nicotine dependence, cigarettes, uncomplicated: Secondary | ICD-10-CM | POA: Diagnosis not present

## 2014-11-22 DIAGNOSIS — Z8673 Personal history of transient ischemic attack (TIA), and cerebral infarction without residual deficits: Secondary | ICD-10-CM | POA: Diagnosis not present

## 2014-11-22 LAB — BASIC METABOLIC PANEL
Anion gap: 7 (ref 5–15)
BUN: 12 mg/dL (ref 6–20)
CHLORIDE: 102 mmol/L (ref 101–111)
CO2: 29 mmol/L (ref 22–32)
Calcium: 9.2 mg/dL (ref 8.9–10.3)
Creatinine, Ser: 0.74 mg/dL (ref 0.44–1.00)
GFR calc Af Amer: >60 mL/min (ref >60)
GFR calc non Af Amer: >60 mL/min (ref >60)
GLUCOSE: 253 mg/dL — AB (ref 65–99)
POTASSIUM: 4 mmol/L (ref 3.5–5.1)
Sodium: 138 mmol/L (ref 135–145)

## 2014-11-22 LAB — MAGNESIUM: Magnesium: 2 mg/dL (ref 1.7–2.4)

## 2014-11-22 NOTE — Patient Instructions (Signed)
Stop aspirin.

## 2014-11-22 NOTE — Progress Notes (Signed)
Patient ID: Melanie Lucas, female   DOB: [birth year], 64 y.o.   MRN: [id number]     Primary Care Physician: Tawanna Solo, MD Referring Physician: [facility] f/u   Melanie Lucas is a 64 y.o. female with a h/o PAF previously managed with dronedarone, lacunar infarct, HTN, DM, CAD, and tobacco abuse. She was  seen in the office on 9/20 and was noted to be back in atrial fibrillation with RVR. Decision was made to admit her for further evaluation and eliquis initiation followed by tikosyn loading.  Following admission, pt was placed on IV diltitazem and dronedarone was discontinued. Eliquis was started with a plan to perfom TEE and cardioversion after she had received 5 doses of eliquis. Rates were relatively stable and troponins were normal. She underwent TEE on 9/23 revealing no evidence of LAA thrombus. This was followed by DCCV using one synchronized shock at 200 joules with successful conversion to sinus bradycardia with intermittent junctional rhythm. Following cardioversion, she was loaded with Tikosyn 500 mcg q12 hrs. She has tolerated tikosyn loading well and has not had any further atrial fibrillation. Her QTc has remained relatively stable around 460 msec.[name] She has had intermittent asymptomatic sinus and junctional bradycardia with rates dipping into the 30's at times. As a result,  discontinued daily beta blocker therapy, though she will continue to use propranolol as needed for tremors.  She reports today no further afib. She feels well. She continues 325 mg asa although d/c instructions were to stop asa. QTc 417 ms. She is being complaint with blood thinner and tikosyn and knows the importance of doing so. She is tolerating bradycardia well. Continues propanolol 20 mg a day for tremors  Today, she denies symptoms of palpitations, chest pain, shortness of breath, orthopnea, PND, lower extremity edema, dizziness, presyncope, syncope, or neurologic sequela. The patient is tolerating  medications without difficulties and is otherwise without complaint today.   Past Medical History  Diagnosis Date  . Diabetes mellitus   . Hypertension   . PAF with post-termination pauses     a. on dronedarone;  b. CHA2DS2VASc = 5 (HTN, DM, h/o lacunar infarct on MRI, Female) ->refused oral anticoagulation after h/o hematuria on pradaxa;  c. 02/2012 Echo: EF 55-60%, mildly dil LA. D. Recurrent PAF 10/2012 after only taking Multaq 1x/day - spont conv to NSR, placed back on BID Multaq/eliquis;  e. 10/2014 Multaq d/c'd->tikosyn initiated.  [name] COPD (chronic obstructive pulmonary disease) ([place])   . Obesity   . Chronic acquired lymphedema     a. R>L;  b. 03/2012 Neg LE U/S for DVT.  [name] Osteoarthritis   . Meniere disease   . CVD (cardiovascular disease)   . Hematuria     a. while on pradaxa,  she reports that she has seen Dr Margie Ege and had low risk cystoscopy  . Tobacco abuse   . Lacunar infarction ([place])     a. 02/2009 non-acute Lacunar infarct of the right thalamus noted on MRI of brain.  . Elevated TSH     a. 10/2012 - inst to f/u PCP.  [name] Coronary artery disease   . Hyperlipidemia    Past Surgical History  Procedure Laterality Date  . Breast biopsy    . Biopsy of adrenal glands    . Tee without cardioversion N/A 11/11/2014    Procedure: TRANSESOPHAGEAL ECHOCARDIOGRAM (TEE);  Surgeon: Larey Dresser, MD;  Location: [facility];  Service: Cardiovascular;  Laterality: N/A;  . Cardioversion N/A 11/11/2014    Procedure: CARDIOVERSION;  Surgeon: Larey Dresser, MD;  Location: [facility] ENDOSCOPY;  Service: Cardiovascular;  Laterality: N/A;    Current Outpatient Prescriptions  Medication Sig Dispense Refill  . albuterol (PROVENTIL HFA;VENTOLIN HFA) 108 (90 BASE) MCG/ACT inhaler Inhale into the lungs every 6 (six) hours as needed for wheezing or shortness of breath.    [name] apixaban (ELIQUIS) 5 MG TABS tablet Take 1 tablet (5 mg total) by mouth 2 (two) times daily. 60 tablet 6  . CALCIUM PO Take 1,000 mg by  mouth daily.    . diazepam (VALIUM) 2 MG tablet Take 2 mg by mouth daily as needed for anxiety.     . dofetilide (TIKOSYN) 500 MCG capsule Take 1 capsule (500 mcg total) by mouth 2 (two) times daily. 60 capsule 6  . furosemide (LASIX) 20 MG tablet Take 20 mg by mouth daily.    [name] glimepiride (AMARYL) 4 MG tablet Take 4 mg by mouth at bedtime.    [name] ibuprofen (ADVIL,MOTRIN) 800 MG tablet Take 800 mg by mouth daily as needed for headache or mild pain.     . metFORMIN (GLUCOPHAGE) 1000 MG tablet Take 1,000 mg by mouth 2 (two) times daily.     . Multiple Vitamin (MULTIVITAMIN) tablet Take 1 tablet by mouth daily.    . potassium chloride SA (K-DUR,KLOR-CON) 20 MEQ tablet Take 1 tablet (20 mEq total) by mouth daily. 30 tablet 6  . propranolol (INDERAL) 20 MG tablet Take 20 mg by mouth 2 (two) times daily as needed (for shaking).     . rosuvastatin (CRESTOR) 5 MG tablet Take 5 mg by mouth at bedtime.     [name] tiotropium (SPIRIVA) 18 MCG inhalation capsule Place 18 mcg into inhaler and inhale daily as needed (only when she feels short of breath).     [name] VALTREX 500 MG tablet Take 500 mg by mouth 2 (two) times daily as needed (for outbreaks).      No current facility-administered medications for this encounter.    Allergies  Allergen Reactions  . Statins Other (See Comments)    Muscle pain  . Sulfonamide Derivatives Hives    Nausea vertigo  . Tape Itching and Rash    Please use "paper" tape only    Social History   Social History  . Marital Status: Married    Spouse Name: N/A  . Number of Children: N/A  . Years of Education: N/A   Occupational History  . Not on file.   Social History Main Topics  . Smoking status: Current Every Day Smoker -- 1.00 packs/day for 46 years    Types: Cigarettes  . Smokeless tobacco: Never Used     Comment: not ready to quit  . Alcohol Use: No  . Drug Use: No  . Sexual Activity: No   Other Topics Concern  . Not on file   Social History Narrative   She  operates an entertainment business    Family History  Problem Relation Age of Onset  . Heart attack Father   . Coronary artery disease Father     strong family hx  . Leukemia Brother   . Cancer Mother     bladder    ROS- All systems are reviewed and negative except as per the HPI above  Physical Exam: Filed Vitals:   11/22/14 1357  BP: 146/78  Pulse: 47  Height: 5\' 7"  (1.702 m)  Weight: 208 lb 12.8 oz (94.711 kg)    GEN- The patient is well appearing, alert  and oriented x 3 today.   Head- normocephalic, atraumatic Eyes-  Sclera clear, conjunctiva pink Ears- hearing intact Oropharynx- clear Neck- supple, no JVP Lymph- no cervical lymphadenopathy Lungs- Clear to ausculation bilaterally, normal work of breathing Heart- Regular rate and rhythm, no murmurs, rubs or gallops, PMI not laterally displaced GI- soft, NT, ND, + BS Extremities- no clubbing, cyanosis, or edema MS- no significant deformity or atrophy Skin- no rash or lesion Psych- euthymic mood, full affect Neuro- strength and sensation are intact  EKG- sinus brady at 47 bpm, pr int 120 ms, qrs 84 ms, qtc 417 ms Epic records reviewed Echo-- Left ventricle: The cavity size was normal. Systolic function was normal. The estimated ejection fraction was in the range of 50% to 55%. Wall motion was normal; there were no regional wall motion abnormalities. The study was not technically sufficient to allow evaluation of LV diastolic dysfunction due to atrial fibrillation. - Aortic valve: Trileaflet; normal thickness leaflets. There was no regurgitation. - Aortic root: The aortic root was normal in size. - Ascending aorta: The ascending aorta was normal in size. - Mitral valve: Structurally normal valve. There was mild regurgitation. - Left atrium: The atrium was normal in size. - Right ventricle: The cavity size was normal. Wall thickness was normal. Systolic function was normal. - Tricuspid valve:  There was mild-moderate regurgitation. - Pulmonary arteries: Systolic pressure was mildly increased. PA peak pressure: 37 mm Hg (S). - Inferior vena cava: The vessel was normal in size. - Pericardium, extracardiac: There was no pericardial effusion.   Assessment and Plan: 1. Afib In SR on tikosyn, with slow v response but asymptomatic Continue eliquis/stop asa Bmet/mag today   2. Tobacco abuse Encouraged cessation  3. HTN  Stable  4. Encouraged regular exercise/weight loss  F/u in afib clinic 10/24  Butch Penny C. Almer [place], [facility] [address] [place], [place] [address] [phone]

## 2014-11-24 DIAGNOSIS — M4802 Spinal stenosis, cervical region: Secondary | ICD-10-CM | POA: Insufficient documentation

## 2014-11-24 DIAGNOSIS — R600 Localized edema: Secondary | ICD-10-CM | POA: Insufficient documentation

## 2014-11-24 DIAGNOSIS — R200 Anesthesia of skin: Secondary | ICD-10-CM | POA: Insufficient documentation

## 2014-12-02 ENCOUNTER — Telehealth (HOSPITAL_COMMUNITY): Payer: Self-pay | Admitting: *Deleted

## 2014-12-02 NOTE — Telephone Encounter (Signed)
Patient called in stating her HR is ranging from 47-50 and "felt weak".  Asked patient if she had recently changed any of her medications and she indicated she has been taking her propranolol daily instead of as needed.  Encouraged patient to use only as needed or if she was needing to take it everyday try taking half a tablet and see if this helps with her rate.  Patient was surprised her heart rate had been running in upper 40s at her last office visit.  Offered patient appt for ekg today but stated would try reducing propranolol and call back in few days with heart rate readings.  Reviewed reasons to report to the ER for bradycardia with patient who verbalized understanding.

## 2014-12-13 ENCOUNTER — Ambulatory Visit (HOSPITAL_COMMUNITY)
Admission: RE | Admit: 2014-12-13 | Discharge: 2014-12-13 | Disposition: A | Discharge status: Home / Self Care | Payer: Medicare Other | Source: Ambulatory Visit | Attending: Nurse Practitioner | Admitting: Nurse Practitioner

## 2014-12-13 ENCOUNTER — Encounter (HOSPITAL_COMMUNITY): Payer: Self-pay | Admitting: Nurse Practitioner

## 2014-12-13 VITALS — BP 124/72 | HR 56 | Ht 67.0 in | Wt 207.8 lb

## 2014-12-13 DIAGNOSIS — Z7984 Long term (current) use of oral hypoglycemic drugs: Secondary | ICD-10-CM | POA: Insufficient documentation

## 2014-12-13 DIAGNOSIS — I2510 Atherosclerotic heart disease of native coronary artery without angina pectoris: Secondary | ICD-10-CM | POA: Insufficient documentation

## 2014-12-13 DIAGNOSIS — Z882 Allergy status to sulfonamides status: Secondary | ICD-10-CM | POA: Insufficient documentation

## 2014-12-13 DIAGNOSIS — I4891 Unspecified atrial fibrillation: Secondary | ICD-10-CM | POA: Diagnosis present

## 2014-12-13 DIAGNOSIS — E785 Hyperlipidemia, unspecified: Secondary | ICD-10-CM | POA: Diagnosis not present

## 2014-12-13 DIAGNOSIS — Z7902 Long term (current) use of antithrombotics/antiplatelets: Secondary | ICD-10-CM | POA: Insufficient documentation

## 2014-12-13 DIAGNOSIS — Z6832 Body mass index (BMI) 32.0-32.9, adult: Secondary | ICD-10-CM | POA: Insufficient documentation

## 2014-12-13 DIAGNOSIS — F17210 Nicotine dependence, cigarettes, uncomplicated: Secondary | ICD-10-CM | POA: Insufficient documentation

## 2014-12-13 DIAGNOSIS — J449 Chronic obstructive pulmonary disease, unspecified: Secondary | ICD-10-CM | POA: Insufficient documentation

## 2014-12-13 DIAGNOSIS — Z8673 Personal history of transient ischemic attack (TIA), and cerebral infarction without residual deficits: Secondary | ICD-10-CM | POA: Insufficient documentation

## 2014-12-13 DIAGNOSIS — I10 Essential (primary) hypertension: Secondary | ICD-10-CM | POA: Insufficient documentation

## 2014-12-13 DIAGNOSIS — E119 Type 2 diabetes mellitus without complications: Secondary | ICD-10-CM | POA: Insufficient documentation

## 2014-12-13 DIAGNOSIS — Z8249 Family history of ischemic heart disease and other diseases of the circulatory system: Secondary | ICD-10-CM | POA: Insufficient documentation

## 2014-12-13 DIAGNOSIS — E669 Obesity, unspecified: Secondary | ICD-10-CM | POA: Diagnosis not present

## 2014-12-13 DIAGNOSIS — Z79899 Other long term (current) drug therapy: Secondary | ICD-10-CM | POA: Insufficient documentation

## 2014-12-13 NOTE — Progress Notes (Signed)
Patient ID: Melanie Lucas, female   DOB: January 22, 1951, 64 y.o.   MRN: [id number]     Primary Care Physician: Tawanna Solo, MD Referring Physician: [facility] f/u   Melanie Lucas is a 64 y.o. female with a h/o PAF previously managed with dronedarone, lacunar infarct, HTN, DM, CAD, and tobacco abuse. She was  seen9/20 and was noted to be back in atrial fibrillation with RVR. Decision was made to admit her for further evaluation and eliquis initiation followed by tikosyn loading.  Following admission, pt was placed on IV diltitazem and dronedarone was discontinued. Eliquis was started with a plan to perfom TEE and cardioversion after she had received 5 doses of eliquis. Rates were relatively stable and troponins were normal. She underwent TEE on 9/23 revealing no evidence of LAA thrombus. This was followed by DCCV using one synchronized shock at 200 joules with successful conversion to sinus bradycardia with intermittent junctional rhythm. Following cardioversion, she was loaded with Tikosyn 500 mcg q12 hrs. She has tolerated tikosyn loading well and has not had any further atrial fibrillation. Her QTc has remained relatively stable around 460 msec.[name] She has had intermittent asymptomatic sinus and junctional bradycardia with rates dipping into the 30's at times. As a result,  discontinued daily beta blocker therapy, though she will continue to use propranolol as needed for tremors.  She reported last visit, no further afib. She felt well. ASA was stopped. QTc 417 ms. She is being complaint with blood thinner and tikosyn and knows the importance of doing so. She is tolerating bradycardia well. Continues propanolol 20 mg as needed for tremors.  Today, reports she has seen a surgeon for spinal stenosis and he told her that he would not do surgery on blood thinners, but told pt that if needed can be stopped short term. She is staying in [place] as far as she knows. She still has trmors of her left hand at times  and can use propanolol prn tremors.  Today, she denies symptoms of palpitations, chest pain, shortness of breath, orthopnea, PND, lower extremity edema, dizziness, presyncope, syncope, or neurologic sequela. The patient is tolerating medications without difficulties and is otherwise without complaint today.   Past Medical History  Diagnosis Date  . Diabetes mellitus   . Hypertension   . PAF with post-termination pauses     a. on dronedarone;  b. CHA2DS2VASc = 5 (HTN, DM, h/o lacunar infarct on MRI, Female) ->refused oral anticoagulation after h/o hematuria on pradaxa;  c. 02/2012 Echo: EF 55-60%, mildly dil LA. D. Recurrent PAF 10/2012 after only taking Multaq 1x/day - spont conv to NSR, placed back on BID Multaq/eliquis;  e. 10/2014 Multaq d/c'd->tikosyn initiated.  [name] COPD (chronic obstructive pulmonary disease) ([place])   . Obesity   . Chronic acquired lymphedema     a. R>L;  b. 03/2012 Neg LE U/S for DVT.  [name] Osteoarthritis   . Meniere disease   . CVD (cardiovascular disease)   . Hematuria     a. while on pradaxa,  she reports that she has seen Dr Margie Ege and had low risk cystoscopy  . Tobacco abuse   . Lacunar infarction ([place])     a. 02/2009 non-acute Lacunar infarct of the right thalamus noted on MRI of brain.  . Elevated TSH     a. 10/2012 - inst to f/u PCP.  [name] Coronary artery disease   . Hyperlipidemia    Past Surgical History  Procedure Laterality Date  . Breast biopsy    .  Biopsy of adrenal glands    . Tee without cardioversion N/A 11/11/2014    Procedure: TRANSESOPHAGEAL ECHOCARDIOGRAM (TEE);  Surgeon: Larey Dresser, MD;  Location: [place];  Service: Cardiovascular;  Laterality: N/A;  . Cardioversion N/A 11/11/2014    Procedure: CARDIOVERSION;  Surgeon: Larey Dresser, MD;  Location: [place];  Service: Cardiovascular;  Laterality: N/A;    Current Outpatient Prescriptions  Medication Sig Dispense Refill  . albuterol (PROVENTIL HFA;VENTOLIN HFA) 108 (90 BASE) MCG/ACT  inhaler Inhale into the lungs every 6 (six) hours as needed for wheezing or shortness of breath.    [name] apixaban (ELIQUIS) 5 MG TABS tablet Take 1 tablet (5 mg total) by mouth 2 (two) times daily. 60 tablet 6  . CALCIUM PO Take 1,000 mg by mouth daily.    . diazepam (VALIUM) 2 MG tablet Take 2 mg by mouth daily as needed for anxiety.     . dofetilide (TIKOSYN) 500 MCG capsule Take 1 capsule (500 mcg total) by mouth 2 (two) times daily. 60 capsule 6  . furosemide (LASIX) 20 MG tablet Take 20 mg by mouth daily.    [name] glimepiride (AMARYL) 4 MG tablet Take 8 mg by mouth at bedtime.     [name] ibuprofen (ADVIL,MOTRIN) 800 MG tablet Take 800 mg by mouth daily as needed for headache or mild pain.     . metFORMIN (GLUCOPHAGE) 1000 MG tablet Take 1,000 mg by mouth 2 (two) times daily.     . Multiple Vitamin (MULTIVITAMIN) tablet Take 1 tablet by mouth daily.    . potassium chloride SA (K-DUR,KLOR-CON) 20 MEQ tablet Take 1 tablet (20 mEq total) by mouth daily. 30 tablet 6  . rosuvastatin (CRESTOR) 5 MG tablet Take 5 mg by mouth at bedtime.     [name] tiotropium (SPIRIVA) 18 MCG inhalation capsule Place 18 mcg into inhaler and inhale daily as needed (only when she feels short of breath).     [name] VALTREX 500 MG tablet Take 500 mg by mouth 2 (two) times daily as needed (for outbreaks).     . propranolol (INDERAL) 20 MG tablet Take 20 mg by mouth 2 (two) times daily as needed (for shaking).      No current facility-administered medications for this encounter.    Allergies  Allergen Reactions  . Statins Other (See Comments)    Muscle pain  . Sulfonamide Derivatives Hives    Nausea vertigo  . Tape Itching and Rash    Please use "paper" tape only    Social History   Social History  . Marital Status: Married    Spouse Name: N/A  . Number of Children: N/A  . Years of Education: N/A   Occupational History  . Not on file.   Social History Main Topics  . Smoking status: Current Every Day Smoker -- 1.00 packs/day  for 46 years    Types: Cigarettes  . Smokeless tobacco: Never Used     Comment: not ready to quit  . Alcohol Use: No  . Drug Use: No  . Sexual Activity: No   Other Topics Concern  . Not on file   Social History Narrative   She operates an entertainment business    Family History  Problem Relation Age of Onset  . Heart attack Father   . Coronary artery disease Father     strong family hx  . Leukemia Brother   . Cancer Mother     bladder    ROS- All systems are  reviewed and negative except as per the HPI above  Physical Exam: Filed Vitals:   12/13/14 1345  BP: 124/72  Pulse: 56  Height: 5\' 7"  (1.702 m)  Weight: 207 lb 12.8 oz (94.257 kg)    GEN- The patient is well appearing, alert and oriented x 3 today.   Head- normocephalic, atraumatic Eyes-  Sclera clear, conjunctiva pink Ears- hearing intact Oropharynx- clear Neck- supple, no JVP Lymph- no cervical lymphadenopathy Lungs- Clear to ausculation bilaterally, normal work of breathing Heart- Regular rate and rhythm, no murmurs, rubs or gallops, PMI not laterally displaced GI- soft, NT, ND, + BS Extremities- no clubbing, cyanosis, or edema MS- no significant deformity or atrophy Skin- no rash or lesion Psych- euthymic mood, full affect Neuro- strength and sensation are intact  EKG- sinus brady at 56 bpm, pr int 120 ms, qrs 100 ms, qtc 441 ms Labs checked 10/3, K+ 4.0 and mag 2.0 Epic records reviewed Echo-- Left ventricle: The cavity size was normal. Systolic function was normal. The estimated ejection fraction was in the range of 50% to 55%. Wall motion was normal; there were no regional wall motion abnormalities. The study was not technically sufficient to allow evaluation of LV diastolic dysfunction due to atrial fibrillation. - Aortic valve: Trileaflet; normal thickness leaflets. There was no regurgitation. - Aortic root: The aortic root was normal in size. - Ascending aorta: The ascending  aorta was normal in size. - Mitral valve: Structurally normal valve. There was mild regurgitation. - Left atrium: The atrium was normal in size. - Right ventricle: The cavity size was normal. Wall thickness was normal. Systolic function was normal. - Tricuspid valve: There was mild-moderate regurgitation. - Pulmonary arteries: Systolic pressure was mildly increased. PA peak pressure: 37 mm Hg (S). - Inferior vena cava: The vessel was normal in size. - Pericardium, extracardiac: There was no pericardial effusion.   Assessment and Plan: 1. Afib In SR on tikosyn  Continue eliquis/stop asa, chadsvasc score at least 5  2. Tobacco abuse Encouraged cessation  3. HTN  Stable  4. Encouraged regular exercise/weight loss  F/u Dr. Rayann Heman as scheduled 12/29  Geroge Baseman. Adonai Helzer, [facility] [address] [place], [place] [address] [phone]

## 2015-01-05 ENCOUNTER — Other Ambulatory Visit: Payer: Self-pay

## 2015-01-05 DIAGNOSIS — Z1231 Encounter for screening mammogram for malignant neoplasm of breast: Secondary | ICD-10-CM

## 2015-01-28 ENCOUNTER — Other Ambulatory Visit: Payer: Self-pay | Admitting: Family Medicine

## 2015-01-28 DIAGNOSIS — Z1231 Encounter for screening mammogram for malignant neoplasm of breast: Secondary | ICD-10-CM

## 2015-02-17 ENCOUNTER — Encounter: Payer: Self-pay | Admitting: Internal Medicine

## 2015-02-17 ENCOUNTER — Ambulatory Visit (INDEPENDENT_AMBULATORY_CARE_PROVIDER_SITE_OTHER): Payer: Medicare Other | Admitting: Internal Medicine

## 2015-02-17 VITALS — BP 142/86 | HR 64 | Ht 67.0 in | Wt 204.6 lb

## 2015-02-17 DIAGNOSIS — I4891 Unspecified atrial fibrillation: Secondary | ICD-10-CM | POA: Diagnosis not present

## 2015-02-17 DIAGNOSIS — I2510 Atherosclerotic heart disease of native coronary artery without angina pectoris: Secondary | ICD-10-CM | POA: Diagnosis not present

## 2015-02-17 DIAGNOSIS — I10 Essential (primary) hypertension: Secondary | ICD-10-CM

## 2015-02-17 DIAGNOSIS — Z79899 Other long term (current) drug therapy: Secondary | ICD-10-CM

## 2015-02-17 LAB — CBC WITH DIFFERENTIAL/PLATELET
BASOS ABS: 0.1 10*3/uL (ref 0.0–0.1)
Basophils Relative: 1 % (ref 0–1)
EOS ABS: 0.1 10*3/uL (ref 0.0–0.7)
EOS PCT: 1 % (ref 0–5)
HEMATOCRIT: 43.2 % (ref 36.0–46.0)
Hemoglobin: 15.2 g/dL — ABNORMAL HIGH (ref 12.0–15.0)
LYMPHS ABS: 2 10*3/uL (ref 0.7–4.0)
Lymphocytes Relative: 26 % (ref 12–46)
MCH: 31.6 pg (ref 26.0–34.0)
MCHC: 35.2 g/dL (ref 30.0–36.0)
MCV: 89.8 fL (ref 78.0–100.0)
MONO ABS: 0.6 10*3/uL (ref 0.1–1.0)
MPV: 10.2 fL (ref 8.6–12.4)
Monocytes Relative: 8 % (ref 3–12)
Neutro Abs: 4.9 10*3/uL (ref 1.7–7.7)
Neutrophils Relative %: 64 % (ref 43–77)
Platelets: 179 10*3/uL (ref 150–400)
RBC: 4.81 MIL/uL (ref 3.87–5.11)
RDW: 14.3 % (ref 11.5–15.5)
WBC: 7.6 10*3/uL (ref 4.0–10.5)

## 2015-02-17 NOTE — Progress Notes (Signed)
PCP: Tawanna Solo, MD Primary Cardiologist:  Dr Hennie Duos Melanie Lucas is a 64 y.o. female who presents today for routine electrophysiology followup.  Since last being seen in our clinic, the patient reports doing very well.  Her AF is controlled with tikosyn.  She reports compliance with medicine.  Today, she denies symptoms of palpitations, chest pain, shortness of breath,  lower extremity edema, dizziness, presyncope, or syncope.  The patient is otherwise without complaint today.   Past Medical History  Diagnosis Date  . Diabetes mellitus   . Hypertension   . PAF with post-termination pauses     a. on dronedarone;  b. CHA2DS2VASc = 5 (HTN, DM, h/o lacunar infarct on MRI, Female) ->refused oral anticoagulation after h/o hematuria on pradaxa;  c. 02/2012 Echo: EF 55-60%, mildly dil LA. D. Recurrent PAF 10/2012 after only taking Multaq 1x/day - spont conv to NSR, placed back on BID Multaq/eliquis;  e. 10/2014 Multaq d/c'd->tikosyn initiated.  [name] COPD (chronic obstructive pulmonary disease) ([place])   . Obesity   . Chronic acquired lymphedema     a. R>L;  b. 03/2012 Neg LE U/S for DVT.  [name] Osteoarthritis   . Meniere disease   . CVD (cardiovascular disease)   . Hematuria     a. while on pradaxa,  she reports that she has seen Dr Margie Ege and had low risk cystoscopy  . Tobacco abuse   . Lacunar infarction ([place])     a. 02/2009 non-acute Lacunar infarct of the right thalamus noted on MRI of brain.  . Elevated TSH     a. 10/2012 - inst to f/u PCP.  [name] Coronary artery disease   . Hyperlipidemia    Past Surgical History  Procedure Laterality Date  . Breast biopsy    . Biopsy of adrenal glands    . Tee without cardioversion N/A 11/11/2014    Procedure: TRANSESOPHAGEAL ECHOCARDIOGRAM (TEE);  Surgeon: Larey Dresser, MD;  Location: [place];  Service: Cardiovascular;  Laterality: N/A;  . Cardioversion N/A 11/11/2014    Procedure: CARDIOVERSION;  Surgeon: Larey Dresser, MD;  Location: [place];   Service: Cardiovascular;  Laterality: N/A;    ROS- all systems are reviewed and negatives except as per HPI above  Current Outpatient Prescriptions  Medication Sig Dispense Refill  . apixaban (ELIQUIS) 5 MG TABS tablet Take 1 tablet (5 mg total) by mouth 2 (two) times daily. 60 tablet 6  . CALCIUM PO Take 1,000 mg by mouth daily.    . diazepam (VALIUM) 2 MG tablet Take 2 mg by mouth daily as needed for anxiety.     . dofetilide (TIKOSYN) 500 MCG capsule Take 1 capsule (500 mcg total) by mouth 2 (two) times daily. 60 capsule 6  . furosemide (LASIX) 20 MG tablet Take 20 mg by mouth daily.    [name] glimepiride (AMARYL) 4 MG tablet Take 8 mg by mouth at bedtime.     [name] ibuprofen (ADVIL,MOTRIN) 800 MG tablet Take 800 mg by mouth daily as needed for headache or mild pain.     . metFORMIN (GLUCOPHAGE) 1000 MG tablet Take 1,000 mg by mouth 2 (two) times daily.     . Multiple Vitamin (MULTIVITAMIN) tablet Take 1 tablet by mouth daily.    . propranolol (INDERAL) 20 MG tablet Take 20 mg by mouth 2 (two) times daily as needed (for shaking).     . rosuvastatin (CRESTOR) 5 MG tablet Take 5 mg by mouth at bedtime.     [name]  tiotropium (SPIRIVA) 18 MCG inhalation capsule Place 18 mcg into inhaler and inhale daily as needed (only when she feels short of breath).     [name] VALTREX 500 MG tablet Take 500 mg by mouth 2 (two) times daily as needed (for outbreaks).      No current facility-administered medications for this visit.    Physical Exam: Filed Vitals:   02/17/15 1230  BP: 142/86  Pulse: 64  Height: 5\' 7"  (1.702 m)  Weight: 204 lb 9.6 oz (92.806 kg)    GEN- The patient is well appearing, alert and oriented x 3 today.   Head- normocephalic, atraumatic Eyes-  Sclera clear, conjunctiva pink Ears- hearing intact Oropharynx- clear Lungs- Clear to ausculation bilaterally, normal work of breathing Heart- Regular rate and rhythm, no murmurs, rubs or gallops, PMI not laterally displaced GI- soft, NT, ND, +  BS Extremities- no clubbing, cyanosis, or edema  ekg today reveals sinus rhythm, LAD, Qtc 472  Assessment and Plan:  1. AF Well controlled chads2vasc score is at least 5 Continue eliquis Continue tikosyn Bmet, mg today  2. CAD No ischemic symptoms Follow-up with Dr Meda Coffee  3. HTn Stable No change required today  Return to see Butch Penny in AF clinic in 3 months Follow-up with me in 6 months Follow-up with Dr Meda Coffee as scheduled  Thompson Grayer MD, [facility] 02/17/2015 12:53 PM

## 2015-02-17 NOTE — Patient Instructions (Signed)
Medication Instructions:  Your physician recommends that you continue on your current medications as directed. Please refer to the Current Medication list given to you today.  Labwork: Labs today: BMET, Magnesium, & CBC  Testing/Procedures: None  ordered  Follow-Up: Your physician recommends that you schedule a follow-up appointment in: 3 months with Roderic Palau, NP.   Your physician wants you to follow-up in: 6 months with Dr. Rayann Heman.  You will receive a reminder letter in the mail two months in advance. If you don't receive a letter, please call our office to schedule the follow-up appointment.  If you need a refill on your cardiac medications before your next appointment, please call your pharmacy.  Thank you for choosing [facility]!!

## 2015-02-18 LAB — BASIC METABOLIC PANEL WITH GFR
BUN: 12 mg/dL (ref 7–25)
CO2: 25 mmol/L (ref 20–31)
Calcium: 9.1 mg/dL (ref 8.6–10.4)
Chloride: 105 mmol/L (ref 98–110)
Creat: 0.77 mg/dL (ref 0.50–0.99)
Glucose, Bld: 230 mg/dL — ABNORMAL HIGH (ref 65–99)
Potassium: 4 mmol/L (ref 3.5–5.3)
Sodium: 140 mmol/L (ref 135–146)

## 2015-02-18 LAB — MAGNESIUM: Magnesium: 1.9 mg/dL (ref 1.5–2.5)

## 2015-03-03 ENCOUNTER — Ambulatory Visit: Payer: Medicare Other

## 2015-03-08 ENCOUNTER — Ambulatory Visit
Admission: RE | Admit: 2015-03-08 | Discharge: 2015-03-08 | Disposition: A | Discharge status: Home / Self Care | Payer: Medicare Other | Source: Ambulatory Visit | Attending: Family Medicine | Admitting: Family Medicine

## 2015-03-08 DIAGNOSIS — Z1231 Encounter for screening mammogram for malignant neoplasm of breast: Secondary | ICD-10-CM

## 2015-04-20 ENCOUNTER — Other Ambulatory Visit: Payer: Self-pay

## 2015-04-20 MED ORDER — DOFETILIDE 500 MCG PO CAPS: 500.0000 ug | ORAL_CAPSULE | Freq: Two times a day (BID) | ORAL | Status: DC

## 2015-05-17 ENCOUNTER — Encounter (HOSPITAL_COMMUNITY): Payer: Self-pay | Admitting: Nurse Practitioner

## 2015-05-17 ENCOUNTER — Ambulatory Visit (HOSPITAL_COMMUNITY)
Admission: RE | Admit: 2015-05-17 | Discharge: 2015-05-17 | Disposition: A | Discharge status: Home / Self Care | Payer: Medicare Other | Source: Ambulatory Visit | Attending: Nurse Practitioner | Admitting: Nurse Practitioner

## 2015-05-17 VITALS — BP 130/80 | HR 55 | Ht 67.0 in | Wt 202.0 lb

## 2015-05-17 DIAGNOSIS — Z8249 Family history of ischemic heart disease and other diseases of the circulatory system: Secondary | ICD-10-CM | POA: Insufficient documentation

## 2015-05-17 DIAGNOSIS — Z79899 Other long term (current) drug therapy: Secondary | ICD-10-CM | POA: Diagnosis not present

## 2015-05-17 DIAGNOSIS — I480 Paroxysmal atrial fibrillation: Secondary | ICD-10-CM | POA: Insufficient documentation

## 2015-05-17 DIAGNOSIS — E119 Type 2 diabetes mellitus without complications: Secondary | ICD-10-CM | POA: Insufficient documentation

## 2015-05-17 DIAGNOSIS — Z882 Allergy status to sulfonamides status: Secondary | ICD-10-CM | POA: Diagnosis not present

## 2015-05-17 DIAGNOSIS — Z8673 Personal history of transient ischemic attack (TIA), and cerebral infarction without residual deficits: Secondary | ICD-10-CM | POA: Diagnosis not present

## 2015-05-17 DIAGNOSIS — E785 Hyperlipidemia, unspecified: Secondary | ICD-10-CM | POA: Insufficient documentation

## 2015-05-17 DIAGNOSIS — J449 Chronic obstructive pulmonary disease, unspecified: Secondary | ICD-10-CM | POA: Insufficient documentation

## 2015-05-17 DIAGNOSIS — Z881 Allergy status to other antibiotic agents status: Secondary | ICD-10-CM | POA: Insufficient documentation

## 2015-05-17 DIAGNOSIS — Z888 Allergy status to other drugs, medicaments and biological substances status: Secondary | ICD-10-CM | POA: Diagnosis not present

## 2015-05-17 DIAGNOSIS — I890 Lymphedema, not elsewhere classified: Secondary | ICD-10-CM | POA: Insufficient documentation

## 2015-05-17 DIAGNOSIS — I2510 Atherosclerotic heart disease of native coronary artery without angina pectoris: Secondary | ICD-10-CM | POA: Diagnosis not present

## 2015-05-17 DIAGNOSIS — Z7984 Long term (current) use of oral hypoglycemic drugs: Secondary | ICD-10-CM | POA: Insufficient documentation

## 2015-05-17 DIAGNOSIS — Z7901 Long term (current) use of anticoagulants: Secondary | ICD-10-CM | POA: Diagnosis not present

## 2015-05-17 DIAGNOSIS — F17210 Nicotine dependence, cigarettes, uncomplicated: Secondary | ICD-10-CM | POA: Insufficient documentation

## 2015-05-17 DIAGNOSIS — I481 Persistent atrial fibrillation: Secondary | ICD-10-CM | POA: Diagnosis not present

## 2015-05-17 DIAGNOSIS — E669 Obesity, unspecified: Secondary | ICD-10-CM | POA: Insufficient documentation

## 2015-05-17 DIAGNOSIS — Z6831 Body mass index (BMI) 31.0-31.9, adult: Secondary | ICD-10-CM | POA: Insufficient documentation

## 2015-05-17 DIAGNOSIS — I10 Essential (primary) hypertension: Secondary | ICD-10-CM | POA: Diagnosis not present

## 2015-05-17 DIAGNOSIS — I4891 Unspecified atrial fibrillation: Secondary | ICD-10-CM | POA: Diagnosis present

## 2015-05-17 DIAGNOSIS — I4819 Other persistent atrial fibrillation: Secondary | ICD-10-CM

## 2015-05-17 LAB — MAGNESIUM: Magnesium: 1.9 mg/dL (ref 1.7–2.4)

## 2015-05-17 LAB — BASIC METABOLIC PANEL
ANION GAP: 9 (ref 5–15)
BUN: 14 mg/dL (ref 6–20)
CO2: 27 mmol/L (ref 22–32)
Calcium: 8.8 mg/dL — ABNORMAL LOW (ref 8.9–10.3)
Chloride: 102 mmol/L (ref 101–111)
Creatinine, Ser: 0.74 mg/dL (ref 0.44–1.00)
GFR calc Af Amer: >60 mL/min (ref >60)
GLUCOSE: 208 mg/dL — AB (ref 65–99)
POTASSIUM: 3.5 mmol/L (ref 3.5–5.1)
Sodium: 138 mmol/L (ref 135–145)

## 2015-05-17 MED ORDER — DOFETILIDE 500 MCG PO CAPS: 500.0000 ug | ORAL_CAPSULE | Freq: Two times a day (BID) | ORAL | Status: DC

## 2015-05-17 NOTE — Progress Notes (Signed)
Patient ID: Melanie Lucas, female   DOB: August 02, 1950, 65 y.o.   MRN: [id number]     Primary Care Physician: Tawanna Solo, MD Referring Physician: [facility] f/u   Melanie Lucas is a 65 y.o. female with a h/o PAF previously managed with dronedarone, lacunar infarct, HTN, DM, CAD, and tobacco abuse. She was  Seen 9/20 and was noted to be back in atrial fibrillation with RVR. Decision was made to admit her for further evaluation and eliquis initiation followed by tikosyn loading.  Following admission, pt was placed on IV diltitazem and dronedarone was discontinued. Eliquis was started with a plan to perfom TEE and cardioversion after she had received 5 doses of eliquis. Rates were relatively stable and troponins were normal. She underwent TEE on 9/23 revealing no evidence of LAA thrombus. This was followed by DCCV using one synchronized shock at 200 joules with successful conversion to sinus bradycardia with intermittent junctional rhythm. Following cardioversion, she was loaded with Tikosyn 500 mcg q12 hrs. She has tolerated tikosyn loading well and has not had any further atrial fibrillation. Her QTc has remained relatively stable around 460 msec. She has had intermittent asymptomatic sinus and junctional bradycardia with rates dipping into the 30's at times. As a result,  discontinued daily beta blocker therapy, though she will continue to use propranolol as needed for tremors.  She reported last visit, no further afib. She felt well. ASA was stopped. QTc 417 ms. She is being complaint with blood thinner and tikosyn and knows the importance of doing so. She is tolerating bradycardia well. Continues propanolol 20 mg as needed for tremors.  Reports she has seen a surgeon for spinal stenosis and he told her that he would not do surgery on blood thinners, but told pt that if needed can be stopped short term. She is staying in [place] as far as she knows. She still has trmors of her left hand at times and can  use propanolol prn tremors.  Return visit 3/28, She feels well, staying in [place]. Compliant with tikosyn and apixaban. No bleeding issues. Planning a trip in June to [place].  Today, she denies symptoms of palpitations, chest pain, shortness of breath, orthopnea, PND, lower extremity edema, dizziness, presyncope, syncope, or neurologic sequela. The patient is tolerating medications without difficulties and is otherwise without complaint today.   Past Medical History  Diagnosis Date  . Diabetes mellitus   . Hypertension   . PAF with post-termination pauses     a. on dronedarone;  b. CHA2DS2VASc = 5 (HTN, DM, h/o lacunar infarct on MRI, Female) ->refused oral anticoagulation after h/o hematuria on pradaxa;  c. 02/2012 Echo: EF 55-60%, mildly dil LA. D. Recurrent PAF 10/2012 after only taking Multaq 1x/day - spont conv to NSR, placed back on BID Multaq/eliquis;  e. 10/2014 Multaq d/c'd->tikosyn initiated.  [name] COPD (chronic obstructive pulmonary disease) ([place])   . Obesity   . Chronic acquired lymphedema     a. R>L;  b. 03/2012 Neg LE U/S for DVT.  [name] Osteoarthritis   . Meniere disease   . CVD (cardiovascular disease)   . Hematuria     a. while on pradaxa,  she reports that she has seen Dr Margie Ege and had low risk cystoscopy  . Tobacco abuse   . Lacunar infarction ([place])     a. 02/2009 non-acute Lacunar infarct of the right thalamus noted on MRI of brain.  . Elevated TSH     a. 10/2012 - inst to f/u PCP.  [name]  Coronary artery disease   . Hyperlipidemia    Past Surgical History  Procedure Laterality Date  . Breast biopsy    . Biopsy of adrenal glands    . Tee without cardioversion N/A 11/11/2014    Procedure: TRANSESOPHAGEAL ECHOCARDIOGRAM (TEE);  Surgeon: Larey Dresser, MD;  Location: [facility];  Service: Cardiovascular;  Laterality: N/A;  . Cardioversion N/A 11/11/2014    Procedure: CARDIOVERSION;  Surgeon: Larey Dresser, MD;  Location: [facility];  Service: Cardiovascular;  Laterality: N/A;     Current Outpatient Prescriptions  Medication Sig Dispense Refill  . apixaban (ELIQUIS) 5 MG TABS tablet Take 1 tablet (5 mg total) by mouth 2 (two) times daily. 60 tablet 6  . CALCIUM PO Take 1,000 mg by mouth daily.    . diazepam (VALIUM) 2 MG tablet Take 2 mg by mouth daily as needed for anxiety.     . dofetilide (TIKOSYN) 500 MCG capsule Take 1 capsule (500 mcg total) by mouth 2 (two) times daily. 180 capsule 3  . furosemide (LASIX) 20 MG tablet Take 20 mg by mouth daily.    [name] glimepiride (AMARYL) 4 MG tablet Take 8 mg by mouth at bedtime.     [name] ibuprofen (ADVIL,MOTRIN) 800 MG tablet Take 800 mg by mouth daily as needed for headache or mild pain.     . metFORMIN (GLUCOPHAGE) 1000 MG tablet Take 1,000 mg by mouth 2 (two) times daily.     . Multiple Vitamin (MULTIVITAMIN) tablet Take 1 tablet by mouth daily.    . propranolol (INDERAL) 20 MG tablet Take 20 mg by mouth 2 (two) times daily as needed (for shaking).     . rosuvastatin (CRESTOR) 5 MG tablet Take 5 mg by mouth at bedtime.     [name] tiotropium (SPIRIVA) 18 MCG inhalation capsule Place 18 mcg into inhaler and inhale daily as needed (only when she feels short of breath).     [name] VALTREX 500 MG tablet Take 500 mg by mouth 2 (two) times daily as needed (for outbreaks).      No current facility-administered medications for this encounter.    Allergies  Allergen Reactions  . Statins Other (See Comments)    Muscle pain  . Sulfamethoxazole Nausea And Vomiting    Other reaction(s): Dizziness (intolerance)  . Sulfonamide Derivatives Hives    Nausea vertigo  . Ciprofloxacin Rash  . Tape Itching and Rash    Please use "paper" tape only    Social History   Social History  . Marital Status: Married    Spouse Name: N/A  . Number of Children: N/A  . Years of Education: N/A   Occupational History  . Not on file.   Social History Main Topics  . Smoking status: Current Every Day Smoker -- 1.00 packs/day for 46 years    Types:  Cigarettes  . Smokeless tobacco: Never Used     Comment: not ready to quit  . Alcohol Use: No  . Drug Use: No  . Sexual Activity: No   Other Topics Concern  . Not on file   Social History Narrative   She operates an entertainment business    Family History  Problem Relation Age of Onset  . Heart attack Father   . Coronary artery disease Father     strong family hx  . Leukemia Brother   . Cancer Mother     bladder    ROS- All systems are reviewed and negative except as per the HPI  above  Physical Exam: Filed Vitals:   05/17/15 1524  BP: 130/80  Pulse: 55  Height: 5\' 7"  (1.702 m)  Weight: 202 lb (91.627 kg)    GEN- The patient is well appearing, alert and oriented x 3 today.   Head- normocephalic, atraumatic Eyes-  Sclera clear, conjunctiva pink Ears- hearing intact Oropharynx- clear Neck- supple, no JVP Lymph- no cervical lymphadenopathy Lungs- Clear to ausculation bilaterally, normal work of breathing Heart- Regular rate and rhythm, no murmurs, rubs or gallops, PMI not laterally displaced GI- soft, NT, ND, + BS Extremities- no clubbing, cyanosis, or edema MS- no significant deformity or atrophy Skin- no rash or lesion Psych- euthymic mood, full affect Neuro- strength and sensation are intact  EKG- sinus brady at 55 bpm, pr int 120 ms, qrs 84 ms, qtc 420 ms Epic records reviewed  Echo-- Left ventricle: The cavity size was normal. Systolic function was normal. The estimated ejection fraction was in the range of 50% to 55%. Wall motion was normal; there were no regional wall motion abnormalities. The study was not technically sufficient to allow evaluation of LV diastolic dysfunction due to atrial fibrillation. - Aortic valve: Trileaflet; normal thickness leaflets. There was no regurgitation. - Aortic root: The aortic root was normal in size. - Ascending aorta: The ascending aorta was normal in size. - Mitral valve: Structurally normal valve.  There was mild regurgitation. - Left atrium: The atrium was normal in size. - Right ventricle: The cavity size was normal. Wall thickness was normal. Systolic function was normal. - Tricuspid valve: There was mild-moderate regurgitation. - Pulmonary arteries: Systolic pressure was mildly increased. PA peak pressure: 37 mm Hg (S). - Inferior vena cava: The vessel was normal in size. - Pericardium, extracardiac: There was no pericardial effusion.   Assessment and Plan: 1. Afib In SR on tikosyn  Continue eliquis, chadsvasc score at least 5  2. Tobacco abuse Encouraged cessation  3. HTN  Stable  4. Encouraged regular exercise/weight loss  F/u Dr. Rayann Heman as scheduled in June  Melanie Lucas, [facility] [address] [place], [place] [address] [phone]

## 2015-05-18 ENCOUNTER — Other Ambulatory Visit (HOSPITAL_COMMUNITY): Payer: Self-pay | Admitting: *Deleted

## 2015-05-18 MED ORDER — APIXABAN 5 MG PO TABS: 5.0000 mg | ORAL_TABLET | Freq: Two times a day (BID) | ORAL | Status: DC

## 2015-05-18 MED ORDER — POTASSIUM CHLORIDE CRYS ER 20 MEQ PO TBCR: EXTENDED_RELEASE_TABLET | ORAL | Status: DC

## 2015-05-26 ENCOUNTER — Ambulatory Visit (HOSPITAL_COMMUNITY)
Admission: RE | Admit: 2015-05-26 | Discharge: 2015-05-26 | Disposition: A | Discharge status: Home / Self Care | Payer: Medicare Other | Source: Ambulatory Visit | Attending: Nurse Practitioner | Admitting: Nurse Practitioner

## 2015-05-26 DIAGNOSIS — I480 Paroxysmal atrial fibrillation: Secondary | ICD-10-CM | POA: Insufficient documentation

## 2015-05-26 LAB — BASIC METABOLIC PANEL
ANION GAP: 11 (ref 5–15)
BUN: 14 mg/dL (ref 6–20)
CALCIUM: 8.9 mg/dL (ref 8.9–10.3)
CO2: 27 mmol/L (ref 22–32)
Chloride: 100 mmol/L — ABNORMAL LOW (ref 101–111)
Creatinine, Ser: 0.89 mg/dL (ref 0.44–1.00)
GLUCOSE: 352 mg/dL — AB (ref 65–99)
Potassium: 4 mmol/L (ref 3.5–5.1)
Sodium: 138 mmol/L (ref 135–145)

## 2015-06-21 ENCOUNTER — Telehealth (HOSPITAL_COMMUNITY): Payer: Self-pay | Admitting: *Deleted

## 2015-06-21 NOTE — Telephone Encounter (Signed)
Pt called in stating she needed to have 4 teeth pulled and what recommendations for her eliquis would be for this procedure. Roderic Palau NP discussed with [place] PharmD with recommendation of holding eliquis for 24 hours prior only due to history of stroke. Patient verbalized understanding.

## 2015-07-20 ENCOUNTER — Telehealth: Payer: Self-pay | Admitting: Pharmacist

## 2015-07-20 NOTE — Telephone Encounter (Signed)
Received fax from [facility] that patient is scheduled for colonoscopy on 07/22/15. Pt takes Eliquis for afib (CHADS2 score of 4 - HTN, DM, lacunar infarction noted on MRI in 2011). Ok to hold Eliquis x24 hours prior to procedure. Clearance faxed to [facility] [phone].

## 2015-07-22 ENCOUNTER — Other Ambulatory Visit: Payer: Self-pay | Admitting: Gastroenterology

## 2015-07-22 ENCOUNTER — Telehealth: Payer: Self-pay | Admitting: Internal Medicine

## 2015-07-22 NOTE — Telephone Encounter (Signed)
GI MD requested that she sty of Eliquis for 2 more days due to removing a polyp. This is an FYI for [place] as the MD is worried about her bleeding

## 2015-07-22 NOTE — Telephone Encounter (Signed)
New Message:   Pt had Colonoscopy this morning. She wants to know if it is all right for her to stay off of her Eliquis for 2 more days?Please call today,so she will know what to do.

## 2015-07-27 ENCOUNTER — Other Ambulatory Visit: Payer: Self-pay | Admitting: Internal Medicine

## 2015-09-16 ENCOUNTER — Other Ambulatory Visit: Payer: Self-pay | Admitting: Neurosurgery

## 2015-09-22 ENCOUNTER — Telehealth (HOSPITAL_COMMUNITY): Payer: Self-pay | Admitting: *Deleted

## 2015-09-22 NOTE — Telephone Encounter (Signed)
Pt on Eliquis for a history of Afib.  CHADS score of 4-  HTN, DM, lacunar infarction noted on MRI in 2011.  Surgery date is 8/31 so request is to hold Eliquis x 7 days.  Given her history of lacunar infarct, will refer to Dr. Rayann Heman to discuss time off Eliquis with neurosurgeon.

## 2015-09-22 NOTE — Telephone Encounter (Addendum)
Pt called in stating she is having  Consuella Lose, MD Primary   Procedure Laterality Anesthesia  ANTERIOR CERVICAL CORPECTOMY AND FUSION C6, ANTERIOR PLATING C5-C7       On 10/20/15. Surgeon is recommending she come off her Eliquis on 8/24 - pt is wanting to confirm this is ok. Please advise.  [phone]

## 2015-09-25 NOTE — Telephone Encounter (Signed)
Would recommend that eliquis be held only 48 hours.  Resume soon as possible after surgery. If neurosurgeon needs to hold longer, would recommend lovenox bridge.

## 2015-09-26 NOTE — Telephone Encounter (Signed)
Notified pt of recommendations. She will call her surgeon and see if 48 hours is sufficient or if 7 days is truly required. Pt will call back with more information.

## 2015-10-06 NOTE — Telephone Encounter (Signed)
Called to check on status of Eliquis and surgery. Pt states she is holding it for 48 hours only prior to surgery. Instructed patient if surgeon instructions should be longer prior to surgery to inform coumadin clinic so that lovenox may be arranged.

## 2015-10-10 NOTE — Telephone Encounter (Signed)
Received call from Dr. Cleotilde Neer office that they are not comfortable performing procedure with pt off Eliquis for 2 days. They are requesting pt hold Eliquis x7 days with Lovenox bridge... PK/PD very similar between the two and both are eliminated within 12 hours from the body. Confirmed again with MD that they want pt to receive Lovenox up until 24 hours prior to procedure rather than the last Eliquis dose 48 hours prior (since pt would actually receive less blood thinner if she just remained on Eliquis until 48 hours prior) and they confirmed this.  Will bring pt in to Coumadin clinic on 8/24 to discuss bridging prior to procedure. Will not need Lovenox after procedure since Eliquis will be fully anticoagulating the pt 2 hours after it's restarted.

## 2015-10-13 ENCOUNTER — Encounter (HOSPITAL_COMMUNITY)
Admission: RE | Admit: 2015-10-13 | Discharge: 2015-10-13 | Disposition: A | Discharge status: Home / Self Care | Payer: Medicare Other | Source: Ambulatory Visit | Attending: Neurosurgery | Admitting: Neurosurgery

## 2015-10-13 ENCOUNTER — Encounter (HOSPITAL_COMMUNITY): Payer: Self-pay

## 2015-10-13 ENCOUNTER — Ambulatory Visit (INDEPENDENT_AMBULATORY_CARE_PROVIDER_SITE_OTHER): Payer: Medicare Other | Admitting: Pharmacist

## 2015-10-13 DIAGNOSIS — Z7984 Long term (current) use of oral hypoglycemic drugs: Secondary | ICD-10-CM | POA: Diagnosis not present

## 2015-10-13 DIAGNOSIS — I252 Old myocardial infarction: Secondary | ICD-10-CM | POA: Diagnosis not present

## 2015-10-13 DIAGNOSIS — Z79899 Other long term (current) drug therapy: Secondary | ICD-10-CM | POA: Insufficient documentation

## 2015-10-13 DIAGNOSIS — E119 Type 2 diabetes mellitus without complications: Secondary | ICD-10-CM | POA: Diagnosis not present

## 2015-10-13 DIAGNOSIS — I480 Paroxysmal atrial fibrillation: Secondary | ICD-10-CM

## 2015-10-13 DIAGNOSIS — Z8673 Personal history of transient ischemic attack (TIA), and cerebral infarction without residual deficits: Secondary | ICD-10-CM | POA: Insufficient documentation

## 2015-10-13 DIAGNOSIS — Z7901 Long term (current) use of anticoagulants: Secondary | ICD-10-CM | POA: Insufficient documentation

## 2015-10-13 DIAGNOSIS — F17200 Nicotine dependence, unspecified, uncomplicated: Secondary | ICD-10-CM | POA: Insufficient documentation

## 2015-10-13 DIAGNOSIS — I10 Essential (primary) hypertension: Secondary | ICD-10-CM | POA: Diagnosis not present

## 2015-10-13 DIAGNOSIS — Z01812 Encounter for preprocedural laboratory examination: Secondary | ICD-10-CM | POA: Diagnosis not present

## 2015-10-13 DIAGNOSIS — Z01818 Encounter for other preprocedural examination: Secondary | ICD-10-CM | POA: Insufficient documentation

## 2015-10-13 DIAGNOSIS — J449 Chronic obstructive pulmonary disease, unspecified: Secondary | ICD-10-CM | POA: Diagnosis not present

## 2015-10-13 DIAGNOSIS — I2510 Atherosclerotic heart disease of native coronary artery without angina pectoris: Secondary | ICD-10-CM | POA: Insufficient documentation

## 2015-10-13 DIAGNOSIS — E785 Hyperlipidemia, unspecified: Secondary | ICD-10-CM | POA: Insufficient documentation

## 2015-10-13 HISTORY — DX: Reserved for inherently not codable concepts without codable children: IMO0001

## 2015-10-13 HISTORY — DX: Personal history of urinary calculi: Z87.442

## 2015-10-13 HISTORY — DX: Acute myocardial infarction, unspecified: I21.9

## 2015-10-13 HISTORY — DX: Other complications of anesthesia, initial encounter: T88.59XA

## 2015-10-13 HISTORY — DX: Adverse effect of unspecified anesthetic, initial encounter: T41.45XA

## 2015-10-13 HISTORY — DX: Malignant (primary) neoplasm, unspecified: C80.1

## 2015-10-13 LAB — SURGICAL PCR SCREEN
MRSA, PCR: NEGATIVE
Staphylococcus aureus: NEGATIVE

## 2015-10-13 LAB — BASIC METABOLIC PANEL
ANION GAP: 6 (ref 5–15)
BUN: 12 mg/dL (ref 6–20)
CALCIUM: 9.3 mg/dL (ref 8.9–10.3)
CO2: 30 mmol/L (ref 22–32)
Chloride: 100 mmol/L — ABNORMAL LOW (ref 101–111)
Creatinine, Ser: 0.77 mg/dL (ref 0.44–1.00)
GFR calc non Af Amer: >60 mL/min (ref >60)
Glucose, Bld: 241 mg/dL — ABNORMAL HIGH (ref 65–99)
POTASSIUM: 4.4 mmol/L (ref 3.5–5.1)
Sodium: 136 mmol/L (ref 135–145)

## 2015-10-13 LAB — CBC
HEMATOCRIT: 45.7 % (ref 36.0–46.0)
HEMOGLOBIN: 16 g/dL — AB (ref 12.0–15.0)
MCH: 32.1 pg (ref 26.0–34.0)
MCHC: 35 g/dL (ref 30.0–36.0)
MCV: 91.8 fL (ref 78.0–100.0)
Platelets: 165 10*3/uL (ref 150–400)
RBC: 4.98 MIL/uL (ref 3.87–5.11)
RDW: 13 % (ref 11.5–15.5)
WBC: 7.3 10*3/uL (ref 4.0–10.5)

## 2015-10-13 LAB — GLUCOSE, CAPILLARY: GLUCOSE-CAPILLARY: 244 mg/dL — AB (ref 65–99)

## 2015-10-13 MED ORDER — ENOXAPARIN SODIUM 100 MG/ML ~~LOC~~ SOLN: 100.0000 mg | Freq: Two times a day (BID) | SUBCUTANEOUS | 0 refills | Status: DC

## 2015-10-13 NOTE — Progress Notes (Signed)
Patient seen for Eliquis bridging. Request was confirmed that MD wants pt off Eliquis for 7 days prior to procedure and that she needs Lovenox bridging.  SCr 0.89, wt 92.8kg, CrCl 89.86. Will dose Lovenox 100mg  BID. Pt takes Eliquis at 2am and 2pm, would prefer to do Lovenox injections at the same times.  Dosing below also provided to patient:  8/23: last dose of Eliquis 8/24: Has not taken AM dose of Eliquis yet, will start PM dose of Lovenox 100mg  [name]. 8/25: Inject Lovenox 100mg  [name] at 2am and 2pm. No Eliquis. 8/26: Inject Lovenox 100mg  [name] at  2am and 2pm.. No Eliquis. 8/27: Inject Lovenox 100mg  [name] at  2am and 2pm. No Eliquis. 8/28: Inject Lovenox 100mg  [name] at  2am and 2pm. No Eliquis. 8/29: Inject Lovenox 100mg   at  2am and 2pm. No Eliquis. 8/30: Inject Lovenox 100mg   at 2am, take pm dose at 12 noon (for 24 hour free interval prior to procedure). No Eliquis. 8/31: Procedure at 12:45pm. No Lovenox or Eliquis. 9/1: Resume Eliquis, usually 24 hours post op or as directed by MD.

## 2015-10-13 NOTE — Pre-Procedure Instructions (Addendum)
Melanie Lucas  10/13/2015    Your procedure is scheduled on : Thursday, August 31.  Report to [facility] Admitting at 11:00 A.M.                 Your surgery or procedure is scheduled for  1:00 PM  Call this number if you have problems the morning of [phone]                  For any other questions, please call [phone], Monday - Friday 8 AM - 4 PM.  Remember:  Do not eat food or drink liquids after midnight Wednesday, August 30.  Take these medicines the morning of surgery with A SIP OF WATER: propranolol (INDERAL), TIKOSYN.  May [place] inhaler if needed.                May take Valium if needed.                 DO NOT take medications for Diabetes the morning if surgery                    DO NOT  Glimepiride  the night before or the morning of surgery.               1 Week prior to surgery STOP taking Aspirin , Aspirin Products (Goody Powder, Excedrin Migraine), Ibuprofen (Advil), Naproxen (Aleve), Viatiams and Herbal Products (ie Fish Oil)                 Stop Eliquis as instructed by Dr Rayann Heman . Begin Lovenox as instructed. How to Manage Your Diabetes Before and After Surgery  Why is it important to control my blood sugar before and after surgery? . Improving blood sugar levels before and after surgery helps healing and can limit problems. . A way of improving blood sugar control is eating a healthy diet by: o  Eating less sugar and carbohydrates o  Increasing activity/exercise o  Talking with your doctor about reaching your blood sugar goals . High blood sugars (greater than 180 mg/dL) can raise your risk of infections and slow your recovery, so you will need to focus on controlling your diabetes during the weeks before surgery. . Make sure that the doctor who takes care of your diabetes knows about your planned surgery including the date and location.  How do I manage my blood sugar before surgery? . Check your blood sugar at least 4 times a day,  starting 2 days before surgery, to make sure that the level is not too high or low. o Check your blood sugar the morning of your surgery when you wake up and every 2 hours until you get to the Short Stay unit. . If your blood sugar is less than 70 mg/dL, you will need to treat for low blood sugar: o Do not take insulin. o Treat a low blood sugar (less than 70 mg/dL) with  cup of clear juice (cranberry or apple), 4 glucose tablets, OR glucose gel. o Recheck blood sugar in 15 minutes after treatment (to make sure it is greater than 70 mg/dL). If your blood sugar is not greater than 70 mg/dL on recheck, call [phone] for further instructions. . Report your blood sugar to the short stay nurse when you get to Short Stay.  . If you are admitted to the hospital after surgery: o Your blood sugar will be checked by the staff and  you will probably be given insulin after surgery (instead of oral diabetes medicines) to make sure you have good blood sugar levels. o The goal for blood sugar control after surgery is 80-180 mg/dL.  WHAT DO I DO ABOUT MY DIABETES MEDICATION  . Do not take oral diabetes medicines (pills) the morning of surgery  Patient Signature:  Date:   Nurse Signature:  Date:   Reviewed and Endorsed by [facility] Patient Education Committee, August 2015   Do not wear jewelry, make-up or nail polish.  Do not wear lotions, powders, or perfumes, or deodprant.  Do not shave 48 hours prior to surgery.    Do not bring valuables to the hospital.  [facility] is not responsible for any belongings or valuables.  Contacts, dentures or bridgework may not be worn into surgery.  Leave your suitcase in the car.  After surgery it may be brought to your room.  For patients admitted to the hospital, discharge time will be determined by your treatment team.  Special instructions:Review  [facility] - Preparing For Surgery.  Please read over the following fact sheets that you were given. [facility]- Preparing For Surgery and Patient Instructions for Mupirocin Application, Cough and Deep Breath.

## 2015-10-13 NOTE — Patient Instructions (Signed)
8/23: last dose of Eliquis 8/24: Has not taken AM dose of Eliquis yet, will start PM dose of Lovenox 100mg  [place] at 2pm. 8/25: Inject Lovenox 100mg  [place] at 2am and 2pm. No Eliquis. 8/26: Inject Lovenox 100mg  [place] at 2am and 2pm. No Eliquis. 8/27: Inject Lovenox 100mg  [place] at 2am and 2pm No Eliquis. 8/28: Inject Lovenox 100mg  [place] at 2am and 2pm. No Eliquis. 8/29: Inject Lovenox 100mg  [place] at 2am and 2pm. No Eliquis. 8/30: Inject Lovenox 100mg   at 2am, take pm dose at 12pm. No Eliquis. 8/31: Procedure date 12:45pm. No Lovenox or Eliquis. 9/1: Resume Eliquis, usually 24 hours post op or as directed by MD.

## 2015-10-14 ENCOUNTER — Encounter (HOSPITAL_COMMUNITY): Payer: Self-pay | Admitting: Emergency Medicine

## 2015-10-14 LAB — HEMOGLOBIN A1C
HEMOGLOBIN A1C: 10.4 % — AB (ref 4.8–5.6)
Mean Plasma Glucose: 252 mg/dL

## 2015-10-14 NOTE — Progress Notes (Signed)
Anesthesia Chart Review:  Pt is a 65 year old female scheduled for anterior cervical corpectomy and fusion C6, anterior plating C5-7 on 10/20/2015 with Consuella Lose, MD   EP cardiologist is Thompson Grayer, MD.  PCP is Kathyrn Lass, MD.  Cardiologist is Ena Dawley, MD, last office visit 08/07/13.   PMH includes:  PAF, CAD, MI, stroke, HTN, DM, hyperlipidemia, COPD. Current smoker. BMI 31  Medications include: albuterol, eliquis, lovenox, lasix, glimepiride, metformin, potassium, propranolol, crestor, tikosyn, spiriva. Last dose of eliquis 10/12/15. Pt started lovenox bridge 10/13/15.   Preoperative labs reviewed.  HgbA1c 10.4, glucose 241. Will get PT DOS.   EKG 05/17/15: Sinus bradycardia (55 bpm). Possible Lateral infarct , age undetermined  Echo 11/12/14:  - Left ventricle: The cavity size was normal. Systolic function was normal. The estimated ejection fraction was in the range of 50% to 55%. Wall motion was normal; there were no regional wall motion abnormalities. The study was not technically sufficient to allow evaluation of LV diastolic dysfunction due to atrial fibrillation. - Aortic valve: Trileaflet; normal thickness leaflets. There was no regurgitation. - Aortic root: The aortic root was normal in size. - Ascending aorta: The ascending aorta was normal in size. - Mitral valve: Structurally normal valve. There was mild regurgitation. - Left atrium: The atrium was normal in size. - Right ventricle: The cavity size was normal. Wall thickness was normal. Systolic function was normal. - Tricuspid valve: There was mild-moderate regurgitation. - Pulmonary arteries: Systolic pressure was mildly increased. PA peak pressure: 37 mm Hg (S). - Inferior vena cava: The vessel was normal in size. - Pericardium, extracardiac: There was no pericardial effusion.  Cardiac cath 02/27/13: successful PTCA/stent to diagonal branch with 0% residual  Reviewed case with Dr. Conrad Antler. Dr. Meda Coffee needs to  weigh in on surgery.   Willeen Cass, FNP-BC [facility]/Anesthesiology Phone: [phone] 10/14/2015 5:12 PM

## 2015-10-14 NOTE — Progress Notes (Signed)
At PAT appointment Melanie Lucas reported along with neck and shoulder pain that she just starting right upper abdomen pain that radiates to her back.  Melanie Antell reported that she has had shingles in the past and that the pain was similar.  Patient reported thast there is no sign on her body of shingles.  I instructed patient to notify Dr Ralene Ok if she starts showing signs of shingles.

## 2015-10-17 ENCOUNTER — Ambulatory Visit (INDEPENDENT_AMBULATORY_CARE_PROVIDER_SITE_OTHER): Payer: Medicare Other | Admitting: Family Medicine

## 2015-10-17 ENCOUNTER — Ambulatory Visit (INDEPENDENT_AMBULATORY_CARE_PROVIDER_SITE_OTHER): Payer: Medicare Other

## 2015-10-17 VITALS — BP 156/78 | HR 60 | Temp 98.0°F | Resp 18 | Ht 66.0 in | Wt 196.0 lb

## 2015-10-17 DIAGNOSIS — R1011 Right upper quadrant pain: Secondary | ICD-10-CM

## 2015-10-17 DIAGNOSIS — K5900 Constipation, unspecified: Secondary | ICD-10-CM | POA: Diagnosis not present

## 2015-10-17 DIAGNOSIS — R109 Unspecified abdominal pain: Secondary | ICD-10-CM | POA: Diagnosis not present

## 2015-10-17 LAB — POC MICROSCOPIC URINALYSIS (UMFC): Mucus: ABSENT

## 2015-10-17 LAB — COMPREHENSIVE METABOLIC PANEL
ALBUMIN: 3.9 g/dL (ref 3.6–5.1)
ALK PHOS: 77 U/L (ref 33–130)
ALT: 24 U/L (ref 6–29)
AST: 19 U/L (ref 10–35)
BILIRUBIN TOTAL: 0.6 mg/dL (ref 0.2–1.2)
BUN: 16 mg/dL (ref 7–25)
CALCIUM: 9.1 mg/dL (ref 8.6–10.4)
CO2: 30 mmol/L (ref 20–31)
CREATININE: 0.77 mg/dL (ref 0.50–0.99)
Chloride: 103 mmol/L (ref 98–110)
GLUCOSE: 307 mg/dL — AB (ref 65–99)
Potassium: 4.1 mmol/L (ref 3.5–5.3)
SODIUM: 140 mmol/L (ref 135–146)
Total Protein: 6.1 g/dL (ref 6.1–8.1)

## 2015-10-17 LAB — POCT CBC
GRANULOCYTE PERCENT: 56 % (ref 37–80)
HCT, POC: 43.8 % (ref 37.7–47.9)
Hemoglobin: 15.7 g/dL (ref 12.2–16.2)
Lymph, poc: 2.6 (ref 0.6–3.4)
MCH, POC: 32.6 pg — AB (ref 27–31.2)
MCHC: 35.9 g/dL — AB (ref 31.8–35.4)
MCV: 90.9 fL (ref 80–97)
MID (cbc): 0.3 (ref 0–0.9)
MPV: 7.6 fL (ref 0–99.8)
PLATELET COUNT, POC: 167 10*3/uL (ref 142–424)
POC Granulocyte: 3.7 (ref 2–6.9)
POC LYMPH %: 38.8 % (ref 10–50)
POC MID %: 5.2 %M (ref 0–12)
RBC: 4.82 M/uL (ref 4.04–5.48)
RDW, POC: 13.3 %
WBC: 6.6 10*3/uL (ref 4.6–10.2)

## 2015-10-17 LAB — POCT URINALYSIS DIP (MANUAL ENTRY)
Bilirubin, UA: NEGATIVE
Glucose, UA: 500 — AB
Ketones, POC UA: NEGATIVE
Leukocytes, UA: NEGATIVE
Nitrite, UA: NEGATIVE
PROTEIN UA: NEGATIVE
RBC UA: NEGATIVE
SPEC GRAV UA: 1.01
UROBILINOGEN UA: 0.2
pH, UA: 5

## 2015-10-17 LAB — LIPASE: Lipase: 22 U/L (ref 7–60)

## 2015-10-17 NOTE — Progress Notes (Addendum)
By signing my name below, I, Mesha Guinyard, attest that this documentation has been prepared under the direction and in the presence of Delman Cheadle.  Electronically Signed: Verlee Monte, Medical Scribe. 10/17/15. 6:06 PM.  Subjective:    Patient ID: Melanie Lucas, female    DOB: 1950/12/27, 65 y.o.   MRN: [id number]  Flank Pain  Associated symptoms include abdominal pain. Pertinent negatives include no fever.   Chief Complaint  Patient presents with  . Flank Pain    right side, pt states she has had shingles before, x 3 weeks    HPI Comments: Melanie Lucas is a 65 y.o. female who presents to the [facility] complaining of consistent RUQ that radiates to her right flank pain onset 3 weeks. Pain is stabbing, and burning that worsens with walking, not after a meal. Pt reports low neck/shoulder pain, pain on top of both buttocks, and more flactulance. Pt mentions she will go days with a nl bm, but she hasn't been eating much due to loss of appetite. Pt has used aspercreme, and ibuprofen 800 mg that gave little to no relief to her symptoms. Pt had 2 kidney stones once in the past, and one of them had cyst and there was a 1 cm kidney stone. Pt had adrenal adenoma in 2011. Pt still has her gallbladder. Pt's PCP is Tawanna Solo, MD but pt manages her DM with her DM doctor. Pt is getting spinal stenosis surgery, ANTERIOR CERVICAL CORPECTOMY AND FUSION C6, ANTERIOR PLATING C5-C7, in her neck 8/31. Pt mentions she had 2 growths over her adrenals and states it wasn't checked since it was found. Pt isn't sure why her magnesium has been checked frequently. Pt denies PMHx of pancrease problems. Pt denies pain in her mid back, rash, diarrhea, nausea, and vomiting.  Can't tolerate the metformin due to diarrhea resulting in fecal incontinence.  6 mos prior had and fecal and urinary incontinence.  Patient Active Problem List   Diagnosis Date Noted  . HLD (hyperlipidemia) 11/09/2014  .  Diabetes mellitus ([place]) 11/09/2014  . Pulmonary hypertension ([place]) 08/07/2013  . CAD (coronary artery disease) 03/11/2013  . Peripheral edema 02/25/2012  . Essential hypertension, benign 05/06/2009  . Overweight 04/13/2009  . TOBACCO ABUSE 04/13/2009  . Atrial fibrillation with RVR ([place]) 04/13/2009  . CHEST PAIN, ATYPICAL 04/13/2009   Past Medical History:  Diagnosis Date  . Cancer (HCC)    Skin- leg   . Chronic acquired lymphedema    a. R>L;  b. 03/2012 Neg LE U/S for DVT. Legs since age 3  . Complication of anesthesia    woke up during colonoscopy, Lithrostrixpy, Biospy  . COPD (chronic obstructive pulmonary disease) ([place])   . Coronary artery disease   . CVD (cardiovascular disease)   . Diabetes mellitus    Type II  . Elevated TSH    a. 10/2012 - inst to f/u PCP.  [name] Hematuria    a. while on pradaxa,  she reports that she has seen Dr Margie Ege and had low risk cystoscopy  . History of kidney stones   . Hyperlipidemia   . Hypertension    patient denies  . Lacunar infarction ([place])    a. 02/2009 non-acute Lacunar infarct of the right thalamus noted on MRI of brain.  . Meniere disease   . Myocardial infarction ([place])   . Obesity   . Osteoarthritis   . PAF with post-termination pauses    a. on dronedarone;  b. CHA2DS2VASc = 5 (HTN, DM, h/o lacunar infarct on MRI, Female) ->refused oral anticoagulation after h/o hematuria on pradaxa;  c. 02/2012 Echo: EF 55-60%, mildly dil LA. D. Recurrent PAF 10/2012 after only taking Multaq 1x/day - spont conv to NSR, placed back on BID Multaq/eliquis;  e. 10/2014 Multaq d/c'd->tikosyn initiated.  . Shortness of breath dyspnea    with exertion  . Stroke [facility])    2 mini strokes  . Tobacco abuse    Past Surgical History:  Procedure Laterality Date  . Biopsy of adrenal glands    . BREAST BIOPSY    . CARDIAC CATHETERIZATION    . CARDIOVERSION N/A 11/11/2014   Procedure: CARDIOVERSION;  Surgeon: Larey Dresser, MD;  Location: [facility] ENDOSCOPY;  Service:  Cardiovascular;  Laterality: N/A;  . COLONOSCOPY W/ POLYPECTOMY    . CORONARY STENT PLACEMENT    . TEE WITHOUT CARDIOVERSION N/A 11/11/2014   Procedure: TRANSESOPHAGEAL ECHOCARDIOGRAM (TEE);  Surgeon: Larey Dresser, MD;  Location: [facility];  Service: Cardiovascular;  Laterality: N/A;   Allergies  Allergen Reactions  . Avelox [Moxifloxacin]     unknown  . Chantix [Varenicline]     nightmares  . Fosamax [Alendronate]     Pain all over  . Other     Blood thinners-extreme bleeding  . Statins Other (See Comments)    Muscle pain  . Sulfamethoxazole Nausea And Vomiting    Other reaction(s): Dizziness (intolerance)  . Sulfonamide Derivatives Hives    Nausea vertigo  . Ciprofloxacin Rash  . Gabapentin Rash    burning  . Tape Itching and Rash    Please use "paper" tape only   Prior to Admission medications   Medication Sig Start Date End Date Taking? Authorizing Provider  diazepam (VALIUM) 2 MG tablet Take 2 mg by mouth daily as needed for anxiety.  09/02/12  Yes Historical Provider, MD  furosemide (LASIX) 20 MG tablet Take 20 mg by mouth daily as needed for edema.    Yes Historical Provider, MD  glimepiride (AMARYL) 4 MG tablet Take 8 mg by mouth 2 (two) times daily.    Yes Historical Provider, MD  metFORMIN (GLUCOPHAGE) 1000 MG tablet Take 1,000 mg by mouth at bedtime.    Yes Historical Provider, MD  Multiple Vitamin (MULTIVITAMIN) tablet Take 1 tablet by mouth daily.   Yes Historical Provider, MD  potassium chloride SA (K-DUR,KLOR-CON) 20 MEQ tablet Take 1 tablet by mouth twice daily for next 2 days then reduce to 1 tablet (88meq) daily Patient taking differently: Take 20 mEq by mouth daily.  05/18/15  Yes Sherran Needs, NP  rosuvastatin (CRESTOR) 5 MG tablet Take 5 mg by mouth at bedtime.    Yes Historical Provider, MD  albuterol (PROVENTIL HFA;VENTOLIN HFA) 108 (90 Base) MCG/ACT inhaler Inhale 1-2 puffs into the lungs every 6 (six) hours as needed for wheezing or shortness of  breath.    Historical Provider, MD  apixaban (ELIQUIS) 5 MG TABS tablet Take 1 tablet (5 mg total) by mouth 2 (two) times daily. Patient not taking: Reported on 10/17/2015 05/18/15   Sherran Needs, NP  enoxaparin (LOVENOX) 100 MG/ML injection Inject 1 mL (100 mg total) into the skin every 12 (twelve) hours. Patient not taking: Reported on 10/13/2015 10/13/15   Thompson Grayer, MD  ibuprofen (ADVIL,MOTRIN) 800 MG tablet Take 800 mg by mouth 3 (three) times daily as needed for mild pain.     Historical Provider, MD  propranolol (INDERAL) 20 MG tablet Take  20 mg by mouth 2 (two) times daily as needed (for shaking).     Historical Provider, MD  TIKOSYN 500 MCG capsule TAKE 1 CAPSULE BY MOUTH 2 TIMES DAILY. Patient not taking: Reported on 10/17/2015 07/27/15   Thompson Grayer, MD  tiotropium (SPIRIVA) 18 MCG inhalation capsule Place 18 mcg into inhaler and inhale daily as needed (only when she feels short of breath).     Historical Provider, MD  VALTREX 500 MG tablet Take 500 mg by mouth 2 (two) times daily as needed (for outbreaks).  02/19/13   Historical Provider, MD  zoledronic acid (RECLAST) 5 MG/100ML SOLN injection Inject 5 mg into the vein once.    Historical Provider, MD   Social History   Social History  . Marital status: Married    Spouse name: N/A  . Number of children: N/A  . Years of education: N/A   Occupational History  . Not on file.   Social History Main Topics  . Smoking status: Current Every Day Smoker    Packs/day: 1.00    Years: 46.00    Types: Cigarettes  . Smokeless tobacco: Never Used     Comment: not ready to quit  . Alcohol use No  . Drug use: No  . Sexual activity: No   Other Topics Concern  . Not on file   Social History Narrative   She operates an entertainment business   Depression screen [facility] 2/9 10/17/2015  Decreased Interest 0  Down, Depressed, Hopeless 0  PHQ - 2 Score 0   Review of Systems  Constitutional: Positive for activity change and appetite change.  Negative for chills and fever.  Cardiovascular: Positive for leg swelling.  Gastrointestinal: Positive for abdominal pain and constipation. Negative for abdominal distention, blood in stool, diarrhea, nausea and vomiting.  Genitourinary: Positive for flank pain.  Musculoskeletal: Positive for arthralgias, back pain, myalgias and neck pain (low neck/shoulder). Negative for gait problem.  Skin: Negative for rash.  Hematological: Negative for adenopathy. Bruises/bleeds easily.    Objective:  Physical Exam  Constitutional: She appears well-developed and well-nourished. No distress.  HENT:  Head: Normocephalic and atraumatic.  Eyes: Conjunctivae are normal.  Neck: Neck supple.  Cardiovascular: Normal rate, regular rhythm and normal heart sounds.  Exam reveals no gallop and no friction rub.   No murmur heard. Pulmonary/Chest: Effort normal and breath sounds normal. No respiratory distress. She has no wheezes. She has no rales.  Abdominal: Normal appearance and bowel sounds are normal. There is no hepatosplenomegaly. There is generalized tenderness. There is positive Murphy's sign. There is no rigidity, no rebound, no guarding and no CVA tenderness.  Bowel sounds more tympanitic in RUQ   Neurological: She is alert.  Skin: Skin is warm and dry.  Psychiatric: She has a normal mood and affect. Her behavior is normal.  Nursing note and vitals reviewed.  BP (!) 156/78 (BP Location: Right Arm, Patient Position: Sitting, Cuff Size: Large)   Pulse 60   Temp 98 F (36.7 C)   Resp 18   Ht 5\' 6"  (1.676 m)   Wt 196 lb (88.9 kg)   SpO2 97%   BMI 31.64 kg/m   Results for orders placed or performed in visit on 10/17/15  Comprehensive metabolic panel  Result Value Ref Range   Sodium 140 135 - 146 mmol/L   Potassium 4.1 3.5 - 5.3 mmol/L   Chloride 103 98 - 110 mmol/L   CO2 30 20 - 31 mmol/L   Glucose, Bld  307 (H) 65 - 99 mg/dL   BUN 16 7 - 25 mg/dL   Creat 0.77 0.50 - 0.99 mg/dL   Total  Bilirubin 0.6 0.2 - 1.2 mg/dL   Alkaline Phosphatase 77 33 - 130 U/L   AST 19 10 - 35 U/L   ALT 24 6 - 29 U/L   Total Protein 6.1 6.1 - 8.1 g/dL   Albumin 3.9 3.6 - 5.1 g/dL   Calcium 9.1 8.6 - 10.4 mg/dL  Lipase  Result Value Ref Range   Lipase 22 7 - 60 U/L  POCT CBC  Result Value Ref Range   WBC 6.6 4.6 - 10.2 K/uL   Lymph, poc 2.6 0.6 - 3.4   POC LYMPH PERCENT 38.8 10 - 50 %L   MID (cbc) 0.3 0 - 0.9   POC MID % 5.2 0 - 12 %M   POC Granulocyte 3.7 2 - 6.9   Granulocyte percent 56.0 37 - 80 %G   RBC 4.82 4.04 - 5.48 M/uL   Hemoglobin 15.7 12.2 - 16.2 g/dL   HCT, POC 43.8 37.7 - 47.9 %   MCV 90.9 80 - 97 fL   MCH, POC 32.6 (A) 27 - 31.2 pg   MCHC 35.9 (A) 31.8 - 35.4 g/dL   RDW, POC 13.3 %   Platelet Count, POC 167 142 - 424 K/uL   MPV 7.6 0 - 99.8 fL  POCT urinalysis dipstick  Result Value Ref Range   Color, UA yellow yellow   Clarity, UA clear clear   Glucose, UA =500 (A) negative   Bilirubin, UA negative negative   Ketones, POC UA negative negative   Spec Grav, UA 1.010    Blood, UA negative negative   pH, UA 5.0    Protein Ur, POC negative negative   Urobilinogen, UA 0.2    Nitrite, UA Negative Negative   Leukocytes, UA Negative Negative  POCT Microscopic Urinalysis ([facility])  Result Value Ref Range   WBC,UR,HPF,POC None None WBC/hpf   RBC,UR,HPF,POC None None RBC/hpf   Bacteria None None, Too numerous to count   Mucus Absent Absent   Epithelial Cells, UR Per Microscopy None None, Too numerous to count cells/hpf   Assessment & Plan:  Ex-lax and aloe-based med from [place] and a stool softener at home all of which she uses prn.  1. Abdominal pain, right upper quadrant - suspect constipation or gallstones.  Pancreatitis and hepatitis r/o with nml cmp and lipase.  If no improvement/resolution of sxs after colon cleanout then check abd [place] vs CT.  2. Right flank discomfort   3. Constipation, unspecified constipation type - due to recent increase of opioid use due to  her cervical spinal stenosis for which she is sched for surg in 3d.  start bottle of mag citrate x 1 now, ok to use the laxative and softeners she has at home as well.    Her recent fecal incontinence may be due liquid passage around impaction. She does report a nml colonoscopy 1 mo prior.    Orders Placed This Encounter  Procedures  . DG Abd Acute W/Chest    Standing Status:   Future    Number of Occurrences:   1    Standing Expiration Date:   10/16/2016    Order Specific Question:   Reason for exam:    Answer:   right upper quadrant abdominal and flank pain x 3 wks, h/o kidneys stones, suspect gallstones    Order Specific Question:  Preferred imaging location?    Answer:   External  . Comprehensive metabolic panel  . Lipase  . POCT CBC  . POCT urinalysis dipstick  . POCT Microscopic Urinalysis ([facility])    Over 60 min spent in face-to-face evaluation of and consultation with patient and coordination of care.  Over 50% of this time was spent counseling this patient.  I personally performed the services described in this documentation, which was scribed in my presence. The recorded information has been reviewed and considered, and addended by me as needed.   Delman Cheadle, M.D.  [facility] [address] [place], [place] [address] [phone] phone [phone] fax  10/17/15 10:52 PM

## 2015-10-17 NOTE — Patient Instructions (Addendum)
IF you received an x-ray today, you will receive an invoice from [facility]. Please contact [facility] at [phone] with questions or concerns regarding your invoice.  IF you received labwork today, you will receive an invoice from [employer]. Please contact [facility] at [phone] with questions or concerns regarding your invoice.   Our billing staff will not be able to assist you with questions regarding bills from these companies.  You will be contacted with the lab results as soon as they are available. The fastest way to get your results is to activate your My Chart account. Instructions are located on the last page of this paperwork. If you have not heard from [place] regarding the results in 2 weeks, please contact this office.   About Constipation  Constipation Overview Constipation is the most common gastrointestinal complaint - about 4 million Americans experience constipation and make 2.5 million physician visits a year to get help for the problem.  Constipation can occur when the colon absorbs too much water, the colon's muscle contraction is slow or sluggish, and/or there is delayed transit time through the colon.  The result is stool that is hard and dry.  Indicators of constipation include straining during bowel movements greater than 25% of the time, having fewer than three bowel movements per week, and/or the feeling of incomplete evacuation.  There are established guidelines ([place] II ) for defining constipation. A person needs to have two or more of the following symptoms for at least 12 weeks (not necessarily consecutive) in the preceding 12 months: . Straining in  greater than 25% of bowel movements . Lumpy or hard stools in greater than 25% of bowel movements . Sensation of incomplete emptying in greater than 25% of bowel movements . Sensation of anorectal obstruction/blockade in greater than 25% of bowel movements . Manual  maneuvers to help empty greater than 25% of bowel movements (e.g., digital evacuation, support of the pelvic floor)  . Less than  3 bowel movements/week . Loose stools are not present, and criteria for irritable bowel syndrome are insufficient  Common Causes of Constipation . Lack of fiber in your diet . Lack of physical activity . Medications, including iron and calcium supplements  . Dairy intake . Dehydration . Abuse of laxatives  Travel  Irritable Bowel Syndrome  Pregnancy  Luteal phase of menstruation (after ovulation and before menses)  Colorectal problems  Intestinal Dysfunction  Treating Constipation  There are several ways of treating constipation, including changes to diet and exercise, use of laxatives, adjustments to the pelvic floor, and scheduled toileting.  These treatments include: . increasing fiber and fluids in the diet  . increasing physical activity . learning muscle coordination   learning proper toileting techniques and toileting modifications   designing and sticking  to a toileting schedule     2007, [employer] Doc.22 Constipation, Adult Constipation is when a person has fewer than three bowel movements a week, has difficulty having a bowel movement, or has stools that are dry, hard, or larger than normal. As people grow older, constipation is more common. A low-fiber diet, not taking in enough fluids, and taking certain medicines may make constipation worse.  CAUSES   Certain medicines, such as antidepressants, pain medicine, iron supplements, antacids, and water pills.   Certain diseases, such as diabetes, irritable bowel syndrome (IBS), thyroid disease, or depression.   Not drinking enough water.   Not eating enough fiber-rich foods.   Stress or travel.  Lack of physical activity or exercise.   Ignoring the urge to have a bowel movement.   Using laxatives too much.  SIGNS AND SYMPTOMS   Having fewer than  three bowel movements a week.   Straining to have a bowel movement.   Having stools that are hard, dry, or larger than normal.   Feeling full or bloated.   Pain in the lower abdomen.   Not feeling relief after having a bowel movement.  DIAGNOSIS  Your health care provider will take a medical history and perform a physical exam. Further testing may be done for severe constipation. Some tests may include:  A barium enema X-ray to examine your rectum, colon, and, sometimes, your small intestine.   A sigmoidoscopy to examine your lower colon.   A colonoscopy to examine your entire colon. TREATMENT  Treatment will depend on the severity of your constipation and what is causing it. Some dietary treatments include drinking more fluids and eating more fiber-rich foods. Lifestyle treatments may include regular exercise. If these diet and lifestyle recommendations do not help, your health care provider may recommend taking over-the-counter laxative medicines to help you have bowel movements. Prescription medicines may be prescribed if over-the-counter medicines do not work.  HOME CARE INSTRUCTIONS   Eat foods that have a lot of fiber, such as fruits, vegetables, whole grains, and beans.  Limit foods high in fat and processed sugars, such as french fries, hamburgers, cookies, candies, and soda.   A fiber supplement may be added to your diet if you cannot get enough fiber from foods.   Drink enough fluids to keep your urine clear or pale yellow.   Exercise regularly or as directed by your health care provider.   Go to the restroom when you have the urge to go. Do not hold it.   Only take over-the-counter or prescription medicines as directed by your health care provider. Do not take other medicines for constipation without talking to your health care provider first.  [place] IF:   You have bright red blood in your stool.   Your constipation lasts for  more than 4 days or gets worse.   You have abdominal or rectal pain.   You have thin, pencil-like stools.   You have unexplained weight loss. MAKE SURE YOU:   Understand these instructions.  Will watch your condition.  Will get help right away if you are not doing well or get worse.   This information is not intended to replace advice given to you by your health care provider. Make sure you discuss any questions you have with your health care provider.   Document Released: 11/04/2003 Document Revised: 02/26/2014 Document Reviewed: 11/17/2012 Elsevier Interactive Patient Education [employer].

## 2015-10-20 ENCOUNTER — Encounter: Payer: Self-pay | Admitting: *Deleted

## 2015-10-20 ENCOUNTER — Ambulatory Visit (INDEPENDENT_AMBULATORY_CARE_PROVIDER_SITE_OTHER): Payer: Medicare Other | Admitting: Cardiology

## 2015-10-20 ENCOUNTER — Inpatient Hospital Stay (HOSPITAL_COMMUNITY): Admission: RE | Admit: 2015-10-20 | Payer: Medicare Other | Source: Ambulatory Visit | Admitting: Neurosurgery

## 2015-10-20 ENCOUNTER — Encounter (HOSPITAL_COMMUNITY): Admission: RE | Payer: Self-pay | Source: Ambulatory Visit

## 2015-10-20 ENCOUNTER — Encounter: Payer: Self-pay | Admitting: Cardiology

## 2015-10-20 VITALS — BP 124/64 | HR 54 | Ht 66.0 in | Wt 193.0 lb

## 2015-10-20 DIAGNOSIS — I480 Paroxysmal atrial fibrillation: Secondary | ICD-10-CM | POA: Diagnosis not present

## 2015-10-20 DIAGNOSIS — I10 Essential (primary) hypertension: Secondary | ICD-10-CM | POA: Diagnosis not present

## 2015-10-20 DIAGNOSIS — F17200 Nicotine dependence, unspecified, uncomplicated: Secondary | ICD-10-CM

## 2015-10-20 DIAGNOSIS — E785 Hyperlipidemia, unspecified: Secondary | ICD-10-CM

## 2015-10-20 DIAGNOSIS — I272 Other secondary pulmonary hypertension: Secondary | ICD-10-CM

## 2015-10-20 DIAGNOSIS — Z01818 Encounter for other preprocedural examination: Secondary | ICD-10-CM

## 2015-10-20 DIAGNOSIS — I2510 Atherosclerotic heart disease of native coronary artery without angina pectoris: Secondary | ICD-10-CM | POA: Diagnosis not present

## 2015-10-20 SURGERY — ANTERIOR CERVICAL CORPECTOMY
Anesthesia: General

## 2015-10-20 NOTE — Progress Notes (Signed)
Cardiology Office Note    Date:  10/20/2015   ID:  Melanie Lucas, Melanie Lucas [birth year], MRN [id number]  PCP:  Tawanna Solo, MD  Cardiologist:  Ena Dawley, MD   Chief complain:   History of Present Illness:  Melanie Lucas is a 65 y.o. female patient of Dr. Rayann Heman who has a history of chronic atrial fib controlled with Multaq. She stopped her anticoagulation back in October 2014 because of hematuria on Pradaxa and was reluctant to restart. She also continues to smoke. She has CHA2Ds2VASc=5 (HTN,DM, H/O lacunar infarcton MRI, female Echo 02/2012 EF 55-60%, mildly dilated LA.  Patient was traveling to an [place] in [place] and suffered an MI. Dr. Carlis Stable called our office to discuss her history as she was transferred to a [facility] in [place]. She underwent stenting of a first diagonal that was 95% occluded. She received bare-metal stent because of her history of not taking anticoagulants and hematuria. She was supposed to be sent home on Plavix and aspirin but was never given the Plavix. Since she's been home she denies any chest pain, palpitations, dyspnea, dyspnea on exertion, dizziness, or presyncope. She is trying to cut back on her smoking and is down to 3 cigarettes daily.  10/19/2015 - the patient is coming after 2 years in the meantime she was being followed by Dr. Rayann Heman for paroxysmal atrial fibrillation and he eventually loaded her withTikosyn that is efficient and well tolerated. She is coming today for preop clearance for spinal stenosis surgery. She states that she hasn't had any chest pain on and has dyspnea on exertion only with significant exertion. She continues to travel and walks miles a day, she recently visited [place] where she walked a lot and afterwards was able to walk in [place] without significant symptoms. She denies orthopnea or paroxysmal nocturnal dyspnea. She has chronic lymphedema more on prominent on her left right lower extremity ever since she was  65 year old she states is 13 in her family her aunt had similar problem. She denies any recent palpitations or syncope. She has been compliant with her Crestor and has no side effects, her PCP follow her lipids and there had been at goal. No bleeding with Eliquis. She is being bridged with Lovenox prior to her spinal surgery.  Past Medical History:  Diagnosis Date  . Cancer (HCC)    Skin- leg   . Chronic acquired lymphedema    a. R>L;  b. 03/2012 Neg LE U/S for DVT. Legs since age 4  . Complication of anesthesia    woke up during colonoscopy, Lithrostrixpy, Biospy  . COPD (chronic obstructive pulmonary disease) ([place])   . Coronary artery disease   . CVD (cardiovascular disease)   . Diabetes mellitus    Type II  . Elevated TSH    a. 10/2012 - inst to f/u PCP.  [name] Hematuria    a. while on pradaxa,  she reports that she has seen Dr Margie Ege and had low risk cystoscopy  . History of kidney stones   . Hyperlipidemia   . Hypertension    patient denies  . Lacunar infarction ([place])    a. 02/2009 non-acute Lacunar infarct of the right thalamus noted on MRI of brain.  . Meniere disease   . Myocardial infarction ([place])   . Obesity   . Osteoarthritis   . PAF with post-termination pauses    a. on dronedarone;  b. CHA2DS2VASc = 5 (HTN, DM, h/o lacunar infarct on MRI,  Female) ->refused oral anticoagulation after h/o hematuria on pradaxa;  c. 02/2012 Echo: EF 55-60%, mildly dil LA. D. Recurrent PAF 10/2012 after only taking Multaq 1x/day - spont conv to NSR, placed back on BID Multaq/eliquis;  e. 10/2014 Multaq d/c'd->tikosyn initiated.  . Shortness of breath dyspnea    with exertion  . Stroke [facility])    2 mini strokes  . Tobacco abuse     Past Surgical History:  Procedure Laterality Date  . Biopsy of adrenal glands    . BREAST BIOPSY    . CARDIAC CATHETERIZATION    . CARDIOVERSION N/A 11/11/2014   Procedure: CARDIOVERSION;  Surgeon: Larey Dresser, MD;  Location: [facility] ENDOSCOPY;  Service: Cardiovascular;   Laterality: N/A;  . COLONOSCOPY W/ POLYPECTOMY    . CORONARY STENT PLACEMENT    . TEE WITHOUT CARDIOVERSION N/A 11/11/2014   Procedure: TRANSESOPHAGEAL ECHOCARDIOGRAM (TEE);  Surgeon: Larey Dresser, MD;  Location: [facility] ENDOSCOPY;  Service: Cardiovascular;  Laterality: N/A;    Current Medications: Outpatient Medications Prior to Visit  Medication Sig Dispense Refill  . albuterol (PROVENTIL HFA;VENTOLIN HFA) 108 (90 Base) MCG/ACT inhaler Inhale 1-2 puffs into the lungs every 6 (six) hours as needed for wheezing or shortness of breath.    [name] apixaban (ELIQUIS) 5 MG TABS tablet Take 1 tablet (5 mg total) by mouth 2 (two) times daily. 60 tablet 6  . diazepam (VALIUM) 2 MG tablet Take 2 mg by mouth daily as needed for anxiety.     . furosemide (LASIX) 20 MG tablet Take 20 mg by mouth daily as needed for edema.     [name] glimepiride (AMARYL) 4 MG tablet Take 8 mg by mouth 2 (two) times daily.     [name] ibuprofen (ADVIL,MOTRIN) 800 MG tablet Take 800 mg by mouth 3 (three) times daily as needed for mild pain.     . metFORMIN (GLUCOPHAGE) 1000 MG tablet Take 1,000 mg by mouth at bedtime.     . Multiple Vitamin (MULTIVITAMIN) tablet Take 1 tablet by mouth daily.    . potassium chloride SA (K-DUR,KLOR-CON) 20 MEQ tablet Take 1 tablet by mouth twice daily for next 2 days then reduce to 1 tablet (35meq) daily (Patient taking differently: Take 20 mEq by mouth daily. ) 100 tablet 3  . propranolol (INDERAL) 20 MG tablet Take 20 mg by mouth 2 (two) times daily as needed (for shaking).     . rosuvastatin (CRESTOR) 5 MG tablet Take 5 mg by mouth at bedtime.     [name] TIKOSYN 500 MCG capsule TAKE 1 CAPSULE BY MOUTH 2 TIMES DAILY. 180 capsule 3  . tiotropium (SPIRIVA) 18 MCG inhalation capsule Place 18 mcg into inhaler and inhale daily as needed (only when she feels short of breath).     [name] VALTREX 500 MG tablet Take 500 mg by mouth 2 (two) times daily as needed (for outbreaks).     . zoledronic acid (RECLAST) 5 MG/100ML SOLN  injection Inject 5 mg into the vein once.    . enoxaparin (LOVENOX) 100 MG/ML injection Inject 1 mL (100 mg total) into the skin every 12 (twelve) hours. (Patient not taking: Reported on 10/20/2015) 10 Syringe 0   No facility-administered medications prior to visit.      Allergies:   Avelox [moxifloxacin]; Chantix [varenicline]; Fosamax [alendronate]; Other; Statins; Sulfamethoxazole; Sulfonamide derivatives; Ciprofloxacin; Gabapentin; and Tape   Social History   Social History  . Marital status: Married    Spouse name: N/A  . Number of  children: N/A  . Years of education: N/A   Social History Main Topics  . Smoking status: Current Every Day Smoker    Packs/day: 1.00    Years: 46.00    Types: Cigarettes  . Smokeless tobacco: Never Used     Comment: not ready to quit  . Alcohol use No  . Drug use: No  . Sexual activity: No   Other Topics Concern  . None   Social History Narrative   She operates an entertainment business    Family History:  The patient's family history includes Cancer in her mother; Coronary artery disease in her father; Heart attack in her father; Leukemia in her brother.   ROS:   Please see the history of present illness.    ROS All other systems reviewed and are negative.   PHYSICAL EXAM:   VS:  BP 124/64   Pulse (!) 54   Ht 5\' 6"  (1.676 m)   Wt 193 lb (87.5 kg)   BMI 31.15 kg/m    GEN: Well nourished, well developed, in no acute distress  HEENT: normal  Neck: no JVD, carotid bruits, or masses Cardiac: RRR; no murmurs, rubs, or gallops, Lymphedema up to her knees, R>L  Respiratory:  clear to auscultation bilaterally, normal work of breathing GI: soft, nontender, nondistended, + BS MS: no deformity or atrophy  Skin: warm and dry, no rash Neuro:  Alert and Oriented x 3, Strength and sensation are intact Psych: euthymic mood, full affect  Wt Readings from Last 3 Encounters:  10/20/15 193 lb (87.5 kg)  10/17/15 196 lb (88.9 kg)  10/13/15 196  lb 8 oz (89.1 kg)    Studies/Labs Reviewed:   EKG:  EKG is ordered today.  The ekg ordered today demonstrates Sinus rhythm, with nonspecific ST-T wave abnormalities.  Recent Labs: 11/09/2014: TSH 2.619 05/17/2015: Magnesium 1.9 10/13/2015: Platelets 165 10/17/2015: ALT 24; BUN 16; Creat 0.77; Hemoglobin 15.7; Potassium 4.1; Sodium 140   Lipid Panel    Component Value Date/Time   CHOL 115 11/11/2012 0605   TRIG 112 11/11/2012 0605   HDL 44 11/11/2012 0605   CHOLHDL 2.6 11/11/2012 0605   VLDL 22 11/11/2012 0605   LDLCALC 49 11/11/2012 0605    TTE: 10/2014  - Left ventricle: The cavity size was normal. Systolic function was   normal. The estimated ejection fraction was in the range of 50%   to 55%. Wall motion was normal; there were no regional wall   motion abnormalities. The study was not technically sufficient to   allow evaluation of LV diastolic dysfunction due to atrial   fibrillation. - Aortic valve: Trileaflet; normal thickness leaflets. There was no   regurgitation. - Aortic root: The aortic root was normal in size. - Ascending aorta: The ascending aorta was normal in size. - Mitral valve: Structurally normal valve. There was mild   regurgitation. - Left atrium: The atrium was normal in size. - Right ventricle: The cavity size was normal. Wall thickness was   normal. Systolic function was normal. - Tricuspid valve: There was mild-moderate regurgitation. - Pulmonary arteries: Systolic pressure was mildly increased. PA   peak pressure: 37 mm Hg (S). - Inferior vena cava: The vessel was normal in size. - Pericardium, extracardiac: There was no pericardial effusion.   ASSESSMENT:    1. Essential hypertension, benign   2. Hyperlipidemia   3. Coronary artery disease involving native coronary artery of native heart without angina pectoris   4. PAF (paroxysmal atrial fibrillation) (  [place])   5. TOBACCO ABUSE   6. Pulmonary hypertension ([place])   7. Preoperative evaluation  to rule out surgical contraindication     PLAN:   1. Coronary artery disease - status post PCI with placement of bare-metal stents to the diagonal branch in [place] in January of 2015. Patient never took Plavix currently on aspirin 325 mg daily. She is taking Crestor 5 mg daily, no beta blockers due to 2 COPD with wheezing. The patient is currently asymptomatic. It was reinforced that she should stop smoking. - Continue propranolol, rosuvastatin, and baby aspirin. - Her EKG today shows only mild nonspecific ST-T wave abnormalities.  2. Essential hypertension - controlled on current regimen.  3. A - fib - paroxysmal, chads2vasc score is 5 Controlled presently on Tikosyn and Eliquis, remains in sinus rhythm and has no bleeding.  4. COPD - no recent wheezing, but continues to smoke.  5. Mild pulmonary hypertension - device to stop smoking.  6. Operative evaluation for spinal surgery, there is currently no contraindication from cardiac standpoint.  Medication Adjustments/Labs and Tests Ordered: Current medicines are reviewed at length with the patient today.  Concerns regarding medicines are outlined above.  Medication changes, Labs and Tests ordered today are listed in the Patient Instructions below. Patient Instructions  Medication Instructions:   Your physician recommends that you continue on your current medications as directed. Please refer to the Current Medication list given to you today.     Follow-Up:  Your physician wants you to follow-up in: [place] will receive a reminder letter in the mail two months in advance. If you don't receive a letter, please call our office to schedule the follow-up appointment.      If you need a refill on your cardiac medications before your next appointment, please call your pharmacy.      Signed, Ena Dawley, MD  10/20/2015 10:29 AM    [address], [place], [place]   [address] Phone: [phone]; Fax: [phone]

## 2015-10-20 NOTE — Patient Instructions (Signed)

## 2015-11-07 ENCOUNTER — Other Ambulatory Visit: Payer: Self-pay | Admitting: Family Medicine

## 2015-11-07 DIAGNOSIS — R1011 Right upper quadrant pain: Secondary | ICD-10-CM

## 2015-11-15 ENCOUNTER — Other Ambulatory Visit: Payer: Medicare Other

## 2015-11-17 ENCOUNTER — Encounter (HOSPITAL_COMMUNITY): Payer: Self-pay

## 2015-11-17 ENCOUNTER — Ambulatory Visit
Admission: RE | Admit: 2015-11-17 | Discharge: 2015-11-17 | Disposition: A | Discharge status: Home / Self Care | Payer: Medicare Other | Source: Ambulatory Visit | Attending: Family Medicine | Admitting: Family Medicine

## 2015-11-17 ENCOUNTER — Ambulatory Visit (HOSPITAL_COMMUNITY)
Admission: RE | Admit: 2015-11-17 | Discharge: 2015-11-17 | Disposition: A | Discharge status: Home / Self Care | Payer: Medicare Other | Source: Ambulatory Visit | Attending: Family Medicine | Admitting: Family Medicine

## 2015-11-17 DIAGNOSIS — R1011 Right upper quadrant pain: Secondary | ICD-10-CM

## 2015-11-17 MED ORDER — ZOLEDRONIC ACID 5 MG/100ML IV SOLN
5.0000 mg | Freq: Once | INTRAVENOUS | Status: AC
  Administered 2015-11-17: 5 mg via INTRAVENOUS
  Filled 2015-11-17: qty 100

## 2015-11-17 MED ORDER — SODIUM CHLORIDE 0.9 % IV SOLN
INTRAVENOUS | Status: DC
  Administered 2015-11-17: 13:00:00 via INTRAVENOUS

## 2015-11-17 NOTE — Discharge Instructions (Signed)

## 2015-11-25 ENCOUNTER — Other Ambulatory Visit (HOSPITAL_COMMUNITY): Payer: Self-pay | Admitting: Family Medicine

## 2015-11-25 DIAGNOSIS — R1011 Right upper quadrant pain: Secondary | ICD-10-CM

## 2015-12-08 ENCOUNTER — Encounter (HOSPITAL_COMMUNITY)
Admission: RE | Admit: 2015-12-08 | Discharge: 2015-12-08 | Disposition: A | Discharge status: Home / Self Care | Payer: Medicare Other | Source: Ambulatory Visit | Attending: Family Medicine | Admitting: Family Medicine

## 2015-12-08 DIAGNOSIS — R1011 Right upper quadrant pain: Secondary | ICD-10-CM | POA: Diagnosis present

## 2015-12-08 MED ORDER — TECHNETIUM TC 99M MEBROFENIN IV KIT
5.0000 | PACK | Freq: Once | INTRAVENOUS | Status: AC | PRN
  Administered 2015-12-08: 5 via INTRAVENOUS

## 2015-12-23 ENCOUNTER — Other Ambulatory Visit: Payer: Self-pay | Admitting: Neurosurgery

## 2016-01-25 ENCOUNTER — Encounter: Payer: Self-pay | Admitting: Cardiology

## 2016-02-07 ENCOUNTER — Telehealth: Payer: Self-pay | Admitting: Internal Medicine

## 2016-02-07 NOTE — Telephone Encounter (Signed)
New Message:     Pt is scheduled for surgery again that she did not have in August.She had a letter for clearance,she wants to know if she needs clearance again? She also wants to know which medicine she is supposed to stop? I told her she need her surgeon office to contact us. She said she had already been cleared in August.

## 2016-02-08 NOTE — Telephone Encounter (Signed)
Spoke with the pt and informed her that being she cancelled her surgery Dr Meda Coffee cleared her for back in August 2017, she will need clearance to be done again, due to noted med changes in her chart.  Pt will also need cardiac clearance from her EP MD, Dr Rayann Heman as well.   Informed the pt that I will call her surgeon's office and inform them to fax a clearance letter to both Dr Meda Coffee and Dr Rayann Heman to review and advise on, then fax back to the surgeon's office accordingly.   Pt goes to [facility].  Their contact information is [phone], and surgery scheduler is [place].  Pt verbalized understanding and agrees with this plan.

## 2016-02-08 NOTE — Telephone Encounter (Signed)
Spoke with Antony Madura, Surgery Scheduler at [facility].   Per Antony Madura, the pt is wanting to reschedule her spinal stenosis surgery for Dr Consuella Lose to do.   Per Antony Madura, the pt will need clearance from both of the pts Cardiologist, Dr Meda Coffee and Dr Rayann Heman.   Antony Madura states she will fax 2 clearance notes to our office at [phone], and attention this to both Dr Meda Coffee and Dr Rayann Heman.  Informed Antony Madura that I will give this clearance request to Dr Meda Coffee to review and advise on, and fax this back once its completed.  Informed Nicky that Dr Rayann Heman and RN will do the same thing.   Per Antony Madura, she will be faxing both clearance notes now.   Will send this to Dr Meda Coffee and Dr Jackalyn Lombard RN, to make them  aware of this plan.

## 2016-02-08 NOTE — Telephone Encounter (Signed)
Will route to Dr Meda Coffee

## 2016-02-11 NOTE — Telephone Encounter (Signed)
I will defer to Dr Meda Coffee for clearance.  Anticoagulation clinic to assist with recommendations regarding anticoagulation.

## 2016-02-14 NOTE — Pre-Procedure Instructions (Addendum)
Melanie Lucas  02/14/2016      [id number] - [place], [place] [address] [place] [address] Phone: [phone] Fax: [phone]    Your procedure is scheduled on Tues., Jan 2  Report to [facility] Admitting at 5:30A.M.  Call this number if you have problems the morning of surgery:  [phone]   Remember:  Do not eat food or drink liquids after midnight on Mon., Jan. 1   Take these medicines the morning of surgery with A SIP OF WATER:albuterol if needed-bring to hospital, diazepam(valium) if needed, tikosyn             Stop eliquis per Dr. Kathyrn Sheriff & Dr. Rayann Heman & Dr. Meda Coffee              Stop advil, motrin, ibuprofen, aleve,multivitamin/herbal medicines 1 week prior to surgery                 How to Manage Your Diabetes Before and After Surgery  Why is it important to control my blood sugar before and after surgery? . Improving blood sugar levels before and after surgery helps healing and can limit problems. . A way of improving blood sugar control is eating a healthy diet by: o  Eating less sugar and carbohydrates o  Increasing activity/exercise o  Talking with your doctor about reaching your blood sugar goals . High blood sugars (greater than 180 mg/dL) can raise your risk of infections and slow your recovery, so you will need to focus on controlling your diabetes during the weeks before surgery. . Make sure that the doctor who takes care of your diabetes knows about your planned surgery including the date and location.  How do I manage my blood sugar before surgery? . Check your blood sugar at least 4 times a day, starting 2 days before surgery, to make sure that the level is not too high or low. o Check your blood sugar the morning of your surgery when you wake up and every 2 hours until you get to the Short Stay unit. . If your blood sugar is less than 70 mg/dL, you will need to treat for low blood sugar: o Do  not take insulin. o Treat a low blood sugar (less than 70 mg/dL) with  cup of clear juice (cranberry or apple), 4 glucose tablets, OR glucose gel. o Recheck blood sugar in 15 minutes after treatment (to make sure it is greater than 70 mg/dL). If your blood sugar is not greater than 70 mg/dL on recheck, call [phone] for further instructions. . Report your blood sugar to the short stay nurse when you get to Short Stay.  . If you are admitted to the hospital after surgery: o Your blood sugar will be checked by the staff and you will probably be given insulin after surgery (instead of oral diabetes medicines) to make sure you have good blood sugar levels. o The goal for blood sugar control after surgery is 80-180 mg/dL.              WHAT DO I DO ABOUT MY DIABETES MEDICATION?   [name] Do not take oral diabetes medicines (pills) the morning of surgery.  .         . The day of surgery, do not take other diabetes injectables, including Byetta (exenatide), Bydureon (exenatide ER), Victoza (liraglutide), or Trulicity (dulaglutide).   Other Instructions:  Patient Signature:  Date:   Nurse Signature:  Date:   Reviewed and Endorsed by [facility] Patient Education Committee, August 2015   Do not wear jewelry, make-up or nail polish.  Do not wear lotions, powders, or perfumes, or deoderant.  Do not shave 48 hours prior to surgery.  Men may shave face and neck.  Do not bring valuables to the hospital.  [facility] is not responsible for any belongings or valuables.  Contacts, dentures or bridgework may not be worn into surgery.  Leave your suitcase in the car.  After surgery it may be brought to your room.  For patients admitted to the hospital, discharge time will be determined by your treatment team.  Patients discharged the day of surgery will not be allowed to drive home.    Special instructions: review preparing for surgery handout  Please read over the  following fact sheets that you were given. MRSA Information and Surgical Site Infection Prevention

## 2016-02-15 ENCOUNTER — Telehealth: Payer: Self-pay | Admitting: Internal Medicine

## 2016-02-15 ENCOUNTER — Encounter (HOSPITAL_COMMUNITY): Payer: Self-pay

## 2016-02-15 ENCOUNTER — Telehealth: Payer: Self-pay | Admitting: Cardiology

## 2016-02-15 ENCOUNTER — Encounter (HOSPITAL_COMMUNITY)
Admission: RE | Admit: 2016-02-15 | Discharge: 2016-02-15 | Disposition: A | Discharge status: Home / Self Care | Payer: Medicare Other | Source: Ambulatory Visit | Attending: Neurosurgery | Admitting: Neurosurgery

## 2016-02-15 ENCOUNTER — Telehealth: Payer: Self-pay | Admitting: Pharmacist

## 2016-02-15 DIAGNOSIS — Z01812 Encounter for preprocedural laboratory examination: Secondary | ICD-10-CM | POA: Insufficient documentation

## 2016-02-15 DIAGNOSIS — M4712 Other spondylosis with myelopathy, cervical region: Secondary | ICD-10-CM | POA: Diagnosis not present

## 2016-02-15 HISTORY — DX: Cardiac arrhythmia, unspecified: I49.9

## 2016-02-15 HISTORY — DX: Lymphedema, not elsewhere classified: I89.0

## 2016-02-15 LAB — CBC
HCT: 45.3 % (ref 36.0–46.0)
Hemoglobin: 16.1 g/dL — ABNORMAL HIGH (ref 12.0–15.0)
MCH: 32.3 pg (ref 26.0–34.0)
MCHC: 35.5 g/dL (ref 30.0–36.0)
MCV: 90.8 fL (ref 78.0–100.0)
PLATELETS: 179 10*3/uL (ref 150–400)
RBC: 4.99 MIL/uL (ref 3.87–5.11)
RDW: 12.7 % (ref 11.5–15.5)
WBC: 6.7 10*3/uL (ref 4.0–10.5)

## 2016-02-15 LAB — BASIC METABOLIC PANEL
Anion gap: 8 (ref 5–15)
BUN: 10 mg/dL (ref 6–20)
CALCIUM: 9.2 mg/dL (ref 8.9–10.3)
CO2: 26 mmol/L (ref 22–32)
CREATININE: 0.73 mg/dL (ref 0.44–1.00)
Chloride: 106 mmol/L (ref 101–111)
Glucose, Bld: 235 mg/dL — ABNORMAL HIGH (ref 65–99)
Potassium: 3.8 mmol/L (ref 3.5–5.1)
SODIUM: 140 mmol/L (ref 135–145)

## 2016-02-15 LAB — GLUCOSE, CAPILLARY: GLUCOSE-CAPILLARY: 224 mg/dL — AB (ref 65–99)

## 2016-02-15 LAB — SURGICAL PCR SCREEN
MRSA, PCR: NEGATIVE
STAPHYLOCOCCUS AUREUS: NEGATIVE

## 2016-02-15 NOTE — Telephone Encounter (Signed)
Left a detailed message on Melanie Lucas's extension that this fax was sent to contact information she provided, with confirmation.  Advised on Melanie Lucas's VM that she should call our office back if this hasn't been received by tomorrow morning.

## 2016-02-15 NOTE — Progress Notes (Signed)
Pt. Made aware that a phone  to the [facility]. Will help in expediting the process for the cardiac clearance; pt. Agrees to follow up.  Pt. Inform me that she was here IN Aug. For a preop appt. & the surgery had to be rescheduled due to cardiac not coordinating the care / clearance. A faxed request is being made to Dr. Arna Medici office also  for last OV note & last HgbA1c, because pt.  states it was recently done 1-2 wks. Ago. Pt. Reports that it was 9.0 so she was referred to Dr. Buddy Duty & he took her off all her oral meds & started her on a new drug, she took the first dose today.

## 2016-02-15 NOTE — Telephone Encounter (Signed)
That's ok.

## 2016-02-15 NOTE — Progress Notes (Signed)
Call made to K. Lanier,RN at [facility]. She states this pt.'s primary cardiologist is Dr. Meda Coffee.  She states there is communication between the appropriate staff for Dr. Meda Coffee to give clearance.

## 2016-02-15 NOTE — Telephone Encounter (Signed)
Thompson Grayer, MD     3:03 PM  Note    I will defer to Dr Meda Coffee for clearance.  Anticoagulation clinic to assist with recommendations regarding anticoagulation.

## 2016-02-15 NOTE — Telephone Encounter (Signed)
New message     Melanie Lucas verbalized that she is still waiting on a fax for clearance

## 2016-02-15 NOTE — Progress Notes (Signed)
Spoke with Antony Madura at Dr. Kathyrn Sheriff  Office & she reinforces that the pt. Needs to call the [facility] & tell them that it appears that clearance is not defined at this point & it needs to be in order for the surgery to go on. Antony Madura reports that she has sent the necessary info. to MD- Allred.

## 2016-02-15 NOTE — Telephone Encounter (Signed)
Received message from Dr. Rayann Heman to coordinate anticoagulation clearance for upcoming procedure scheduled for 02/21/16. It appears this procedure was previously scheduled and cancelled. At that time Eliquis was to be held for 7 days and pt to be bridged with lovenox.   Spoke with [facility] surgical scheduler for Dr. Kathyrn Sheriff and confirmed Dr. Kathyrn Sheriff ok with our recommendation to hold Eliquis 48 hours prior to surgery and resume 24 hours post-op or as directed by MD.   Called patient to make aware of above. She states she greatly appreciates help and understands. She will call with any additional questions or concerns.

## 2016-02-15 NOTE — Telephone Encounter (Signed)
Notified Nicky Surgery Scheduler at [facility] that we received faxed clearance from her office today and Dr Meda Coffee reviewed and advised on this, and this has been faxed back to her office for further follow-up with the pt.  Nicky verbalized understanding.

## 2016-02-15 NOTE — Telephone Encounter (Signed)
Thompson Grayer, MD   11:45 PM  Note    Would recommend that eliquis be held only 48 hours.  Resume soon as possible after surgery. If neurosurgeon needs to hold longer, would recommend lovenox bridge.    September 22, 2015     1:24 PM  Melanie Lucas, [place] routed this conversation to Thompson Grayer, MD . Juluis Mire, RN  Melanie Lucas, [facility]   1:22 PM  Note    Pt on Eliquis for a history of Afib.  CHADS score of 4-  HTN, DM, lacunar infarction noted on MRI in 2011.  Surgery date is 8/31 so request is to hold Eliquis x 7 days.  Given her history of lacunar infarct, will refer to Dr. Rayann Heman to discuss time off Eliquis with neurosurgeon.

## 2016-02-15 NOTE — Telephone Encounter (Signed)
Melanie Lucas is calling to check to see whether we received her surgical clearance. Please call. Thanks.

## 2016-02-16 NOTE — Progress Notes (Signed)
Anesthesia Chart Review: Patient is a 65 year old female scheduled for anterior cervical corpectomy and fusion C6, anterior plating C5-7 on 02/21/2016 by Consuella Lose, MD. Procedure was initially scheduled for 10/20/2015 but was postponed to allow time for cardiac clearance. Her diabetes was also poorly controlled.  - PCP is Kathyrn Lass, MD.  - Cardiologist is Ena Dawley, MD, last office visit 10/20/15. There was no contraindication for surgery from a cardiac standpoint at that time. - EP cardiologist is Thompson Grayer, MD.  - Endocrinologist is Delrae Rend, MD (recently referred). She reports he changed her DM medications and started her on Synjardy XR (first does 02/15/16).  PMH includes:  afib/PAF (s/p cardioversion 11/11/14), CAD, MI s/p [facility]) 03/09/13, pulmonary hypertension (mild by 10/2014 echo), TIA and CVA (non-acute lacunar infarct by MRI 02/2009), HTN, DM2, hyperlipidemia, COPD, chronic BLE lymphedema (R>L). Current smoker. BMI 39.49.  Medications include: albuterol, Eliquis, Valium, Synjardy XR, KCL, Crestor, Tikosyn, Valtrex, Reclast. Per Dr. Rayann Heman, "Would recommend that eliquis be held only 48 hours. Resume soon as possible after surgery. If neurosurgeon needs to hold longer, would recommend lovenox bridge."  EKG 10/20/15: SR, nonspecific ST T wave changes, LAD, septal infarct (age undetermined).  Echo 11/12/14:  - Left ventricle: The cavity size was normal. Systolic function was normal. The estimated ejection fraction was in the range of 50%to 55%. Wall motion was normal; there were no regional wall motion abnormalities. The study was not technically sufficient to allow evaluation of LV diastolic dysfunction due to atrial fibrillation. - Aortic valve: Trileaflet; normal thickness leaflets. There was noregurgitation. - Aortic root: The aortic root was normal in size. - Ascending aorta: The ascending aorta was normal in size. - Mitral valve: Structurally  normal valve. There was mild regurgitation. - Left atrium: The atrium was normal in size. - Right ventricle: The cavity size was normal. Wall thickness was normal. Systolic function was normal. - Tricuspid valve: There was mild-moderate regurgitation. - Pulmonary arteries: Systolic pressure was mildly increased. PA peak pressure: 37 mm Hg (S). - Inferior vena cava: The vessel was normal in size. - Pericardium, extracardiac: There was no pericardial effusion.  Cardiac cath (PCI) 02/27/13 ([facility] Findings: Tubular 95% proximal lesion in diagonal 1. Diffuse luminal irregularities 40% proximal lesion diagonal 1. Conclusion: successful PTCA/stent to diagonal branch with 0% residual.  Preoperative labs noted. H/H 16.1/45.3. Cr 0.73. Non-fasting glucose 235. A1c requested for PCP office--patient reported most recent A1c 1-2 weeks ago was "9". (I called this to [facility] at Dr. Cleotilde Neer office, and told her that I would plan to call her if results received were out of this rang. Patient's A1c on 10/13/15 was 10.4, but she has since been referred to an endocrinologist and has has some medication adjustments.)  Patient will get a fasting CBG on arrival. If results acceptable and otherwise no acute changes then I would anticipate that she can proceed as planned.  [facility] Short Stay Center/Anesthesiology Phone [phone] 02/16/2016 3:58 PM

## 2016-02-20 MED ORDER — CEFAZOLIN SODIUM-DEXTROSE 2-4 GM/100ML-% IV SOLN
2.0000 g | INTRAVENOUS | Status: AC
  Administered 2016-02-21: 2 g via INTRAVENOUS

## 2016-02-21 ENCOUNTER — Encounter (HOSPITAL_COMMUNITY)
Admission: RE | Disposition: A | Discharge status: Home / Self Care | Payer: Self-pay | Source: Ambulatory Visit | Attending: Neurosurgery

## 2016-02-21 ENCOUNTER — Inpatient Hospital Stay (HOSPITAL_COMMUNITY): Payer: Medicare Other | Admitting: Anesthesiology

## 2016-02-21 ENCOUNTER — Inpatient Hospital Stay (HOSPITAL_COMMUNITY)
Admission: RE | Admit: 2016-02-21 | Discharge: 2016-02-24 | DRG: 473 | Disposition: A | Discharge status: Home / Self Care | Payer: Medicare Other | Source: Ambulatory Visit | Attending: Neurosurgery | Admitting: Neurosurgery

## 2016-02-21 ENCOUNTER — Inpatient Hospital Stay (HOSPITAL_COMMUNITY): Payer: Medicare Other

## 2016-02-21 ENCOUNTER — Encounter (HOSPITAL_COMMUNITY): Payer: Self-pay | Admitting: Anesthesiology

## 2016-02-21 ENCOUNTER — Inpatient Hospital Stay (HOSPITAL_COMMUNITY): Payer: Medicare Other | Admitting: Vascular Surgery

## 2016-02-21 DIAGNOSIS — I2510 Atherosclerotic heart disease of native coronary artery without angina pectoris: Secondary | ICD-10-CM | POA: Diagnosis present

## 2016-02-21 DIAGNOSIS — E119 Type 2 diabetes mellitus without complications: Secondary | ICD-10-CM | POA: Diagnosis present

## 2016-02-21 DIAGNOSIS — E785 Hyperlipidemia, unspecified: Secondary | ICD-10-CM | POA: Diagnosis present

## 2016-02-21 DIAGNOSIS — Z888 Allergy status to other drugs, medicaments and biological substances status: Secondary | ICD-10-CM

## 2016-02-21 DIAGNOSIS — Z7901 Long term (current) use of anticoagulants: Secondary | ICD-10-CM | POA: Diagnosis not present

## 2016-02-21 DIAGNOSIS — Z6829 Body mass index (BMI) 29.0-29.9, adult: Secondary | ICD-10-CM | POA: Diagnosis not present

## 2016-02-21 DIAGNOSIS — E669 Obesity, unspecified: Secondary | ICD-10-CM | POA: Diagnosis present

## 2016-02-21 DIAGNOSIS — Z85828 Personal history of other malignant neoplasm of skin: Secondary | ICD-10-CM | POA: Diagnosis not present

## 2016-02-21 DIAGNOSIS — Z79899 Other long term (current) drug therapy: Secondary | ICD-10-CM | POA: Diagnosis not present

## 2016-02-21 DIAGNOSIS — F17210 Nicotine dependence, cigarettes, uncomplicated: Secondary | ICD-10-CM | POA: Diagnosis present

## 2016-02-21 DIAGNOSIS — H8109 Meniere's disease, unspecified ear: Secondary | ICD-10-CM | POA: Diagnosis present

## 2016-02-21 DIAGNOSIS — Z882 Allergy status to sulfonamides status: Secondary | ICD-10-CM

## 2016-02-21 DIAGNOSIS — Z8673 Personal history of transient ischemic attack (TIA), and cerebral infarction without residual deficits: Secondary | ICD-10-CM

## 2016-02-21 DIAGNOSIS — I10 Essential (primary) hypertension: Secondary | ICD-10-CM | POA: Diagnosis present

## 2016-02-21 DIAGNOSIS — I480 Paroxysmal atrial fibrillation: Secondary | ICD-10-CM | POA: Diagnosis present

## 2016-02-21 DIAGNOSIS — I890 Lymphedema, not elsewhere classified: Secondary | ICD-10-CM | POA: Diagnosis present

## 2016-02-21 DIAGNOSIS — J449 Chronic obstructive pulmonary disease, unspecified: Secondary | ICD-10-CM | POA: Diagnosis present

## 2016-02-21 DIAGNOSIS — M4712 Other spondylosis with myelopathy, cervical region: Secondary | ICD-10-CM | POA: Diagnosis present

## 2016-02-21 DIAGNOSIS — Z419 Encounter for procedure for purposes other than remedying health state, unspecified: Secondary | ICD-10-CM

## 2016-02-21 DIAGNOSIS — I252 Old myocardial infarction: Secondary | ICD-10-CM

## 2016-02-21 HISTORY — PX: ANTERIOR CERVICAL CORPECTOMY: SHX1159

## 2016-02-21 HISTORY — PX: ANTERIOR FUSION CERVICAL SPINE: SUR626

## 2016-02-21 LAB — GLUCOSE, CAPILLARY
GLUCOSE-CAPILLARY: 240 mg/dL — AB (ref 65–99)
GLUCOSE-CAPILLARY: 186 mg/dL — AB (ref 65–99)
GLUCOSE-CAPILLARY: 180 mg/dL — AB (ref 65–99)
Glucose-Capillary: 279 mg/dL — ABNORMAL HIGH (ref 65–99)
Glucose-Capillary: 206 mg/dL — ABNORMAL HIGH (ref 65–99)

## 2016-02-21 LAB — PROTIME-INR
INR: 1
PROTHROMBIN TIME: 13.2 s (ref 11.4–15.2)

## 2016-02-21 SURGERY — ANTERIOR CERVICAL CORPECTOMY
Anesthesia: General

## 2016-02-21 MED ORDER — INSULIN ASPART 100 UNIT/ML ~~LOC~~ SOLN
6.0000 [IU] | Freq: Once | SUBCUTANEOUS | Status: AC
  Administered 2016-02-21: 6 [IU] via SUBCUTANEOUS
  Filled 2016-02-21: qty 0.06

## 2016-02-21 MED ORDER — SODIUM CHLORIDE 0.9% FLUSH
3.0000 mL | Freq: Two times a day (BID) | INTRAVENOUS | Status: DC
  Administered 2016-02-21 – 2016-02-24 (×5): 3 mL via INTRAVENOUS

## 2016-02-21 MED ORDER — OXYCODONE HCL 5 MG PO TABS: 5.0000 mg | ORAL_TABLET | Freq: Once | ORAL | Status: DC | PRN

## 2016-02-21 MED ORDER — HYDROMORPHONE HCL 1 MG/ML IJ SOLN
0.2500 mg | INTRAMUSCULAR | Status: DC | PRN
  Administered 2016-02-21 (×2): 0.5 mg via INTRAVENOUS

## 2016-02-21 MED ORDER — ROSUVASTATIN CALCIUM 5 MG PO TABS
5.0000 mg | ORAL_TABLET | Freq: Every day | ORAL | Status: DC
  Administered 2016-02-21 – 2016-02-23 (×3): 5 mg via ORAL
  Filled 2016-02-21 (×3): qty 1

## 2016-02-21 MED ORDER — SUGAMMADEX SODIUM 200 MG/2ML IV SOLN
INTRAVENOUS | Status: AC
  Filled 2016-02-21: qty 2

## 2016-02-21 MED ORDER — DEXAMETHASONE SODIUM PHOSPHATE 10 MG/ML IJ SOLN
INTRAMUSCULAR | Status: AC
  Filled 2016-02-21: qty 1

## 2016-02-21 MED ORDER — NON FORMULARY: 10.0000 mg | Freq: Every day | Status: DC

## 2016-02-21 MED ORDER — THROMBIN 5000 UNITS EX SOLR
OROMUCOSAL | Status: DC | PRN
  Administered 2016-02-21: 5 mL via TOPICAL

## 2016-02-21 MED ORDER — ROCURONIUM BROMIDE 100 MG/10ML IV SOLN
INTRAVENOUS | Status: DC | PRN
  Administered 2016-02-21: 50 mg via INTRAVENOUS

## 2016-02-21 MED ORDER — FUROSEMIDE 20 MG PO TABS
20.0000 mg | ORAL_TABLET | Freq: Every day | ORAL | Status: DC
  Administered 2016-02-23 – 2016-02-24 (×2): 20 mg via ORAL
  Filled 2016-02-21 (×4): qty 1

## 2016-02-21 MED ORDER — ONDANSETRON HCL 4 MG/2ML IJ SOLN
INTRAMUSCULAR | Status: DC | PRN
  Administered 2016-02-21: 4 mg via INTRAVENOUS

## 2016-02-21 MED ORDER — SENNOSIDES-DOCUSATE SODIUM 8.6-50 MG PO TABS: 1.0000 | ORAL_TABLET | Freq: Every evening | ORAL | Status: DC | PRN

## 2016-02-21 MED ORDER — ONDANSETRON HCL 4 MG/2ML IJ SOLN: 4.0000 mg | INTRAMUSCULAR | Status: DC | PRN

## 2016-02-21 MED ORDER — PROPOFOL 10 MG/ML IV BOLUS
INTRAVENOUS | Status: AC
  Filled 2016-02-21: qty 20

## 2016-02-21 MED ORDER — METHOCARBAMOL 1000 MG/10ML IJ SOLN
500.0000 mg | Freq: Four times a day (QID) | INTRAMUSCULAR | Status: DC | PRN
  Filled 2016-02-21: qty 5

## 2016-02-21 MED ORDER — THROMBIN 20000 UNITS EX SOLR
CUTANEOUS | Status: DC | PRN
  Administered 2016-02-21: 20 mL via TOPICAL

## 2016-02-21 MED ORDER — SODIUM CHLORIDE 0.9 % IV SOLN: 250.0000 mL | INTRAVENOUS | Status: DC

## 2016-02-21 MED ORDER — MIDAZOLAM HCL 5 MG/5ML IJ SOLN
INTRAMUSCULAR | Status: DC | PRN
  Administered 2016-02-21: 2 mg via INTRAVENOUS

## 2016-02-21 MED ORDER — ACETAMINOPHEN 650 MG RE SUPP: 650.0000 mg | RECTAL | Status: DC | PRN

## 2016-02-21 MED ORDER — SCOPOLAMINE 1 MG/3DAYS TD PT72
MEDICATED_PATCH | TRANSDERMAL | Status: AC
  Administered 2016-02-21: 1.5 mg
  Filled 2016-02-21: qty 1

## 2016-02-21 MED ORDER — HYDROMORPHONE HCL 1 MG/ML IJ SOLN: 0.5000 mg | INTRAMUSCULAR | Status: DC | PRN

## 2016-02-21 MED ORDER — BISACODYL 10 MG RE SUPP: 10.0000 mg | Freq: Every day | RECTAL | Status: DC | PRN

## 2016-02-21 MED ORDER — HYDROMORPHONE HCL 2 MG/ML IJ SOLN
INTRAMUSCULAR | Status: AC
  Filled 2016-02-21: qty 1

## 2016-02-21 MED ORDER — CANAGLIFLOZIN 100 MG PO TABS: 100.0000 mg | ORAL_TABLET | Freq: Every day | ORAL | Status: DC

## 2016-02-21 MED ORDER — DOFETILIDE 500 MCG PO CAPS
500.0000 ug | ORAL_CAPSULE | Freq: Two times a day (BID) | ORAL | Status: DC
  Administered 2016-02-21 – 2016-02-24 (×6): 500 ug via ORAL
  Filled 2016-02-21 (×6): qty 1

## 2016-02-21 MED ORDER — SODIUM CHLORIDE 0.9 % IV SOLN: INTRAVENOUS | Status: DC

## 2016-02-21 MED ORDER — PHENOL 1.4 % MT LIQD
1.0000 | OROMUCOSAL | Status: DC | PRN
  Administered 2016-02-22: 1 via OROMUCOSAL
  Filled 2016-02-21: qty 177

## 2016-02-21 MED ORDER — DIAZEPAM 2 MG PO TABS
2.0000 mg | ORAL_TABLET | Freq: Every day | ORAL | Status: DC | PRN
  Administered 2016-02-22: 2 mg via ORAL
  Filled 2016-02-21: qty 1

## 2016-02-21 MED ORDER — OXYCODONE HCL 5 MG/5ML PO SOLN: 5.0000 mg | Freq: Once | ORAL | Status: DC | PRN

## 2016-02-21 MED ORDER — POTASSIUM CHLORIDE CRYS ER 20 MEQ PO TBCR
20.0000 meq | EXTENDED_RELEASE_TABLET | Freq: Every day | ORAL | Status: DC
  Administered 2016-02-23 – 2016-02-24 (×2): 20 meq via ORAL
  Filled 2016-02-21 (×4): qty 1

## 2016-02-21 MED ORDER — FENTANYL CITRATE (PF) 100 MCG/2ML IJ SOLN
INTRAMUSCULAR | Status: DC | PRN
  Administered 2016-02-21 (×3): 50 ug via INTRAVENOUS

## 2016-02-21 MED ORDER — THROMBIN 5000 UNITS EX SOLR
CUTANEOUS | Status: AC
Start: 2016-02-21 — End: 2016-02-21
  Filled 2016-02-21: qty 5000

## 2016-02-21 MED ORDER — PANTOPRAZOLE SODIUM 40 MG IV SOLR
40.0000 mg | Freq: Every day | INTRAVENOUS | Status: DC
  Administered 2016-02-21: 40 mg via INTRAVENOUS
  Filled 2016-02-21 (×3): qty 40

## 2016-02-21 MED ORDER — MIDAZOLAM HCL 2 MG/2ML IJ SOLN
INTRAMUSCULAR | Status: AC
  Filled 2016-02-21: qty 2

## 2016-02-21 MED ORDER — OXYCODONE-ACETAMINOPHEN 5-325 MG PO TABS
1.0000 | ORAL_TABLET | ORAL | Status: DC | PRN
  Administered 2016-02-21 – 2016-02-22 (×7): 2 via ORAL
  Administered 2016-02-23: 1 via ORAL
  Administered 2016-02-23: 2 via ORAL
  Administered 2016-02-23: 1 via ORAL
  Administered 2016-02-23 – 2016-02-24 (×2): 2 via ORAL
  Filled 2016-02-21 (×12): qty 2

## 2016-02-21 MED ORDER — SODIUM CHLORIDE 0.9% FLUSH
3.0000 mL | INTRAVENOUS | Status: DC | PRN
  Administered 2016-02-24 (×2): 3 mL via INTRAVENOUS
  Filled 2016-02-21 (×2): qty 3

## 2016-02-21 MED ORDER — 0.9 % SODIUM CHLORIDE (POUR BTL) OPTIME
TOPICAL | Status: DC | PRN
  Administered 2016-02-21: 1000 mL

## 2016-02-21 MED ORDER — ONDANSETRON HCL 4 MG/2ML IJ SOLN: 4.0000 mg | Freq: Four times a day (QID) | INTRAMUSCULAR | Status: DC | PRN

## 2016-02-21 MED ORDER — CHLORHEXIDINE GLUCONATE CLOTH 2 % EX PADS: 6.0000 | MEDICATED_PAD | Freq: Once | CUTANEOUS | Status: DC

## 2016-02-21 MED ORDER — BUPIVACAINE HCL 0.5 % IJ SOLN
INTRAMUSCULAR | Status: DC | PRN
  Administered 2016-02-21: 4 mL

## 2016-02-21 MED ORDER — BACITRACIN 50000 UNITS IM SOLR
INTRAMUSCULAR | Status: DC | PRN
  Administered 2016-02-21: 500 mL

## 2016-02-21 MED ORDER — SUGAMMADEX SODIUM 200 MG/2ML IV SOLN
INTRAVENOUS | Status: DC | PRN
  Administered 2016-02-21: 170 mg via INTRAVENOUS

## 2016-02-21 MED ORDER — PHENYLEPHRINE HCL 10 MG/ML IJ SOLN
INTRAVENOUS | Status: DC | PRN
  Administered 2016-02-21: 40 ug/min via INTRAVENOUS

## 2016-02-21 MED ORDER — LACTATED RINGERS IV SOLN
INTRAVENOUS | Status: DC
  Administered 2016-02-21 (×2): via INTRAVENOUS

## 2016-02-21 MED ORDER — MENTHOL 3 MG MT LOZG: 1.0000 | LOZENGE | OROMUCOSAL | Status: DC | PRN

## 2016-02-21 MED ORDER — CEFAZOLIN SODIUM-DEXTROSE 2-4 GM/100ML-% IV SOLN
2.0000 g | Freq: Three times a day (TID) | INTRAVENOUS | Status: AC
  Administered 2016-02-21 (×2): 2 g via INTRAVENOUS
  Filled 2016-02-21 (×2): qty 100

## 2016-02-21 MED ORDER — DEXAMETHASONE SODIUM PHOSPHATE 10 MG/ML IJ SOLN
INTRAMUSCULAR | Status: DC | PRN
  Administered 2016-02-21: 10 mg via INTRAVENOUS

## 2016-02-21 MED ORDER — INSULIN ASPART 100 UNIT/ML ~~LOC~~ SOLN
SUBCUTANEOUS | Status: AC
  Administered 2016-02-21: 6 [IU] via SUBCUTANEOUS
  Filled 2016-02-21: qty 1

## 2016-02-21 MED ORDER — NICOTINE 21 MG/24HR TD PT24
21.0000 mg | MEDICATED_PATCH | TRANSDERMAL | Status: DC
  Administered 2016-02-21 – 2016-02-23 (×3): 21 mg via TRANSDERMAL
  Filled 2016-02-21 (×3): qty 1

## 2016-02-21 MED ORDER — FLEET ENEMA 7-19 GM/118ML RE ENEM: 1.0000 | ENEMA | Freq: Once | RECTAL | Status: DC | PRN

## 2016-02-21 MED ORDER — ACETAMINOPHEN 325 MG PO TABS: 650.0000 mg | ORAL_TABLET | ORAL | Status: DC | PRN

## 2016-02-21 MED ORDER — INSULIN ASPART 100 UNIT/ML ~~LOC~~ SOLN
0.0000 [IU] | Freq: Three times a day (TID) | SUBCUTANEOUS | Status: DC
  Administered 2016-02-21 – 2016-02-22 (×2): 3 [IU] via SUBCUTANEOUS
  Administered 2016-02-22: 2 [IU] via SUBCUTANEOUS
  Administered 2016-02-22: 3 [IU] via SUBCUTANEOUS
  Administered 2016-02-23: 5 [IU] via SUBCUTANEOUS
  Administered 2016-02-23 – 2016-02-24 (×2): 3 [IU] via SUBCUTANEOUS

## 2016-02-21 MED ORDER — PROPOFOL 10 MG/ML IV BOLUS
INTRAVENOUS | Status: DC | PRN
  Administered 2016-02-21: 120 mg via INTRAVENOUS

## 2016-02-21 MED ORDER — THROMBIN 20000 UNITS EX SOLR
CUTANEOUS | Status: AC
  Filled 2016-02-21: qty 20000

## 2016-02-21 MED ORDER — ALBUTEROL SULFATE (2.5 MG/3ML) 0.083% IN NEBU: 2.5000 mg | INHALATION_SOLUTION | Freq: Four times a day (QID) | RESPIRATORY_TRACT | Status: DC | PRN

## 2016-02-21 MED ORDER — LIDOCAINE-EPINEPHRINE (PF) 1 %-1:200000 IJ SOLN
INTRAMUSCULAR | Status: DC | PRN
  Administered 2016-02-21: 4 mL

## 2016-02-21 MED ORDER — BUPIVACAINE HCL (PF) 0.5 % IJ SOLN
INTRAMUSCULAR | Status: AC
  Filled 2016-02-21: qty 30

## 2016-02-21 MED ORDER — ONDANSETRON HCL 4 MG/2ML IJ SOLN
INTRAMUSCULAR | Status: AC
  Filled 2016-02-21: qty 2

## 2016-02-21 MED ORDER — SENNA 8.6 MG PO TABS
1.0000 | ORAL_TABLET | Freq: Two times a day (BID) | ORAL | Status: DC
  Administered 2016-02-21 – 2016-02-24 (×6): 8.6 mg via ORAL
  Filled 2016-02-21 (×6): qty 1

## 2016-02-21 MED ORDER — METHOCARBAMOL 500 MG PO TABS
500.0000 mg | ORAL_TABLET | Freq: Four times a day (QID) | ORAL | Status: DC | PRN
  Administered 2016-02-21 – 2016-02-23 (×7): 500 mg via ORAL
  Filled 2016-02-21 (×7): qty 1

## 2016-02-21 MED ORDER — EMPAGLIFLOZIN-METFORMIN HCL ER 10-1000 MG PO TB24: 1.0000 | ORAL_TABLET | ORAL | Status: DC

## 2016-02-21 MED ORDER — DOCUSATE SODIUM 100 MG PO CAPS
100.0000 mg | ORAL_CAPSULE | Freq: Two times a day (BID) | ORAL | Status: DC
  Administered 2016-02-21 – 2016-02-24 (×7): 100 mg via ORAL
  Filled 2016-02-21 (×7): qty 1

## 2016-02-21 MED ORDER — LIDOCAINE HCL (CARDIAC) 20 MG/ML IV SOLN
INTRAVENOUS | Status: DC | PRN
  Administered 2016-02-21: 60 mg via INTRAVENOUS
  Administered 2016-02-21: 60 mg via INTRATRACHEAL

## 2016-02-21 MED ORDER — VALACYCLOVIR HCL 500 MG PO TABS: 500.0000 mg | ORAL_TABLET | Freq: Two times a day (BID) | ORAL | Status: DC | PRN

## 2016-02-21 MED ORDER — EMPAGLIFLOZIN-METFORMIN HCL ER 10-1000 MG PO TB24
1.0000 | ORAL_TABLET | Freq: Every day | ORAL | Status: DC
  Administered 2016-02-21 – 2016-02-24 (×4): 1 via ORAL
  Filled 2016-02-21 (×4): qty 1

## 2016-02-21 MED ORDER — METFORMIN HCL ER 500 MG PO TB24: 1000.0000 mg | ORAL_TABLET | Freq: Every day | ORAL | Status: DC

## 2016-02-21 MED ORDER — ADULT MULTIVITAMIN W/MINERALS CH
1.0000 | ORAL_TABLET | Freq: Every day | ORAL | Status: DC
  Administered 2016-02-21 – 2016-02-23 (×3): 1 via ORAL
  Filled 2016-02-21 (×3): qty 1

## 2016-02-21 MED ORDER — LIDOCAINE-EPINEPHRINE (PF) 2 %-1:200000 IJ SOLN
INTRAMUSCULAR | Status: AC
Start: 2016-02-21 — End: 2016-02-21
  Filled 2016-02-21: qty 20

## 2016-02-21 MED ORDER — FENTANYL CITRATE (PF) 100 MCG/2ML IJ SOLN
INTRAMUSCULAR | Status: AC
  Filled 2016-02-21: qty 4

## 2016-02-21 SURGICAL SUPPLY — 74 items
ADH SKN CLS APL DERMABOND .7 (GAUZE/BANDAGES/DRESSINGS) ×1
APL SKNCLS STERI-STRIP NONHPOA (GAUZE/BANDAGES/DRESSINGS)
BAG DECANTER FOR FLEXI CONT (MISCELLANEOUS) ×2 IMPLANT
BASKET BONE COLLECTION (BASKET) ×1 IMPLANT
BENZOIN TINCTURE PRP APPL 2/3 (GAUZE/BANDAGES/DRESSINGS) IMPLANT
BLADE CLIPPER SURG (BLADE) IMPLANT
BLADE SURG 11 STRL SS (BLADE) ×2 IMPLANT
BLADE ULTRA TIP 2M (BLADE) IMPLANT
BUR MATCHSTICK NEURO 3.0 LAGG (BURR) ×2 IMPLANT
BUR ROUND FLUTED 5 RND (BURR) IMPLANT
CAGE PEEK 10X14X11 (Cage) ×2 IMPLANT
CAGE PEEK 6X14X11 (Cage) ×2 IMPLANT
CANISTER SUCT 3000ML PPV (MISCELLANEOUS) ×2 IMPLANT
CARTRIDGE OIL MAESTRO DRILL (MISCELLANEOUS) ×1 IMPLANT
CONT SPEC 4OZ CLIKSEAL STRL BL (MISCELLANEOUS) ×4 IMPLANT
DECANTER SPIKE VIAL GLASS SM (MISCELLANEOUS) ×3 IMPLANT
DERMABOND ADVANCED (GAUZE/BANDAGES/DRESSINGS) ×1
DERMABOND ADVANCED .7 DNX12 (GAUZE/BANDAGES/DRESSINGS) ×1 IMPLANT
DIFFUSER DRILL AIR PNEUMATIC (MISCELLANEOUS) ×2 IMPLANT
DRAIN CHANNEL 10M FLAT 3/4 FLT (DRAIN) IMPLANT
DRAPE C-ARM 42X72 X-RAY (DRAPES) ×4 IMPLANT
DRAPE HALF SHEET 40X57 (DRAPES) IMPLANT
DRAPE LAPAROTOMY 100X72 PEDS (DRAPES) ×2 IMPLANT
DRAPE MICROSCOPE LEICA (MISCELLANEOUS) ×2 IMPLANT
DRAPE POUCH INSTRU U-SHP 10X18 (DRAPES) ×2 IMPLANT
DRSG OPSITE 4X5.5 SM (GAUZE/BANDAGES/DRESSINGS) ×4 IMPLANT
DRSG OPSITE POSTOP 3X4 (GAUZE/BANDAGES/DRESSINGS) ×2 IMPLANT
DURAPREP 6ML APPLICATOR 50/CS (WOUND CARE) ×2 IMPLANT
ELECT COATED BLADE 2.86 ST (ELECTRODE) ×2 IMPLANT
ELECT REM PT RETURN 9FT ADLT (ELECTROSURGICAL) ×2
ELECTRODE REM PT RTRN 9FT ADLT (ELECTROSURGICAL) ×1 IMPLANT
EVACUATOR SILICONE 100CC (DRAIN) IMPLANT
GAUZE SPONGE 4X4 16PLY XRAY LF (GAUZE/BANDAGES/DRESSINGS) IMPLANT
GLOVE BIOGEL PI IND STRL 7.5 (GLOVE) ×1 IMPLANT
GLOVE BIOGEL PI INDICATOR 7.5 (GLOVE) ×3
GLOVE ECLIPSE 7.0 STRL STRAW (GLOVE) ×4 IMPLANT
GLOVE EXAM NITRILE LRG STRL (GLOVE) IMPLANT
GLOVE EXAM NITRILE XS STR PU (GLOVE) IMPLANT
GLOVE SS BIOGEL STRL SZ 7.5 (GLOVE) IMPLANT
GLOVE SUPERSENSE BIOGEL SZ 7.5 (GLOVE) ×1
GLOVE SURG SS PI 7.0 STRL IVOR (GLOVE) ×2 IMPLANT
GOWN STRL REUS W/ TWL LRG LVL3 (GOWN DISPOSABLE) ×2 IMPLANT
GOWN STRL REUS W/ TWL XL LVL3 (GOWN DISPOSABLE) IMPLANT
GOWN STRL REUS W/TWL 2XL LVL3 (GOWN DISPOSABLE) IMPLANT
GOWN STRL REUS W/TWL LRG LVL3 (GOWN DISPOSABLE) ×6
GOWN STRL REUS W/TWL XL LVL3 (GOWN DISPOSABLE)
HEMOSTAT POWDER KIT SURGIFOAM (HEMOSTASIS) ×2 IMPLANT
KIT BASIN OR (CUSTOM PROCEDURE TRAY) ×2 IMPLANT
KIT ROOM TURNOVER OR (KITS) ×2 IMPLANT
NDL HYPO 25X1 1.5 SAFETY (NEEDLE) ×1 IMPLANT
NDL SPNL 22GX3.5 QUINCKE BK (NEEDLE) ×1 IMPLANT
NEEDLE HYPO 25X1 1.5 SAFETY (NEEDLE) ×2 IMPLANT
NEEDLE SPNL 22GX3.5 QUINCKE BK (NEEDLE) ×2 IMPLANT
NS IRRIG 1000ML POUR BTL (IV SOLUTION) ×2 IMPLANT
OIL CARTRIDGE MAESTRO DRILL (MISCELLANEOUS) ×2
PACK LAMINECTOMY NEURO (CUSTOM PROCEDURE TRAY) ×2 IMPLANT
PAD ARMBOARD 7.5X6 YLW CONV (MISCELLANEOUS) ×6 IMPLANT
PEEK ANATOMIC STRUT 5X14X11MM (Peek) ×1 IMPLANT
PLATE ARCHON-R 36MM 2L (Plate) ×1 IMPLANT
RUBBERBAND STERILE (MISCELLANEOUS) ×4 IMPLANT
SCREW ARCHON SELFTAP 4.0X13 (Screw) ×6 IMPLANT
SPACER SPNL 11X14X10XPEEK (Cage) IMPLANT
SPACER SPNL 11X14X6XPEEK CVD (Cage) IMPLANT
SPCR SPNL 11X14X10XPEEK (Cage) ×1 IMPLANT
SPCR SPNL 11X14X6XPEEK CVD (Cage) ×1 IMPLANT
SPONGE INTESTINAL PEANUT (DISPOSABLE) ×2 IMPLANT
SPONGE SURGIFOAM ABS GEL 100 (HEMOSTASIS) ×2 IMPLANT
SUT ETHILON 3 0 FSL (SUTURE) IMPLANT
SUT VIC AB 3-0 SH 8-18 (SUTURE) ×2 IMPLANT
SUT VICRYL 3-0 RB1 18 ABS (SUTURE) ×4 IMPLANT
TOWEL OR 17X24 6PK STRL BLUE (TOWEL DISPOSABLE) ×2 IMPLANT
TOWEL OR 17X26 10 PK STRL BLUE (TOWEL DISPOSABLE) ×2 IMPLANT
TRAP SPECIMEN MUCOUS 40CC (MISCELLANEOUS) ×2 IMPLANT
WATER STERILE IRR 1000ML POUR (IV SOLUTION) ×2 IMPLANT

## 2016-02-21 NOTE — Progress Notes (Signed)
Patient transferred to 5C16. Patient is drowsy, oriented x4, incision on neck has skin glue, edges attached, no drainage, patient states pain is "bad" in neck, but does not need any medication at this time. Patient oriented to room, unit, staff. Patient requesting to eat food, requested to eat applesauce. Will continue to monitor.

## 2016-02-21 NOTE — H&P (Addendum)
CC:  Neck pain  HPI: Melanie Lucas is a 66 year old woman I'm seeing for the above.  She was last seen more than a year ago with MRI scan that demonstrated spinal cord compression with radiographic evidence of myelomalacia.  At that time I recommended anterior cervical corpectomy for decompression.  In the interim, she did speak with her primary care physician, and actually obtained a second opinion at [facility].  They recommended a posterior decompression with lateral mass screw stabilization.  She says she did not like the idea of screws and rods in her neck, and wanted to be re-referred here.  Previously, she was complaining of some numbness primarily in the third and fourth digits of her left hand, as well as some incoordination with the upper extremities, and imbalance.  The symptoms are essentially unchanged now.  Currently, however, she is also been experiencing progressively worsening midline neck pain with radiation across the tops of her shoulders.  Of note, she does have a history of Afib, on eliquis which she stopped 72hrs ago under the recommendation of Dr. Rayann Heman her cardiologist.  PMH: Past Medical History:  Diagnosis Date  . Cancer (HCC)    Skin- leg   . Chronic acquired lymphedema    a. R>L;  b. 03/2012 Neg LE U/S for DVT. Legs since age 18  . Complication of anesthesia    woke up during colonoscopy, Lithrostrixpy, Biospy  . COPD (chronic obstructive pulmonary disease) ([place])   . Coronary artery disease   . CVD (cardiovascular disease)   . Diabetes mellitus    Type II  . Dysrhythmia    afib  . Elevated TSH    a. 10/2012 - inst to f/u PCP.  [name] Hematuria    a. while on pradaxa,  she reports that she has seen Dr Margie Ege and had low risk cystoscopy  . History of kidney stones   . Hyperlipidemia   . Hypertension    patient denies  . Lacunar infarction ([place])    a. 02/2009 non-acute Lacunar infarct of the right thalamus noted on MRI of brain.  [name] Lymphedema of extremity    had this problem, since she was a teenager. especially seen in R LE  . Meniere disease   . Myocardial infarction   . Obesity   . Osteoarthritis    cervical & lumbar region, knees, hands cramp also   . PAF with post-termination pauses    a. on dronedarone;  b. CHA2DS2VASc = 5 (HTN, DM, h/o lacunar infarct on MRI, Female) ->refused oral anticoagulation after h/o hematuria on pradaxa;  c. 02/2012 Echo: EF 55-60%, mildly dil LA. D. Recurrent PAF 10/2012 after only taking Multaq 1x/day - spont conv to NSR, placed back on BID Multaq/eliquis;  e. 10/2014 Multaq d/c'd->tikosyn initiated.  . Shortness of breath dyspnea    with exertion  . Stroke [facility])    2 mini strokes  . Tobacco abuse     PSH: Past Surgical History:  Procedure Laterality Date  . Biopsy of adrenal glands    . BREAST BIOPSY Bilateral    benign results, both breasts   . CARDIAC CATHETERIZATION  2014   x1 stent placed, done in [place]   . CARDIOVERSION N/A 11/11/2014   Procedure: CARDIOVERSION;  Surgeon: Larey Dresser, MD;  Location: [facility];  Service: Cardiovascular;  Laterality: N/A;  . COLONOSCOPY W/ POLYPECTOMY    . CORONARY STENT PLACEMENT    . TEE WITHOUT CARDIOVERSION N/A 11/11/2014   Procedure: TRANSESOPHAGEAL  ECHOCARDIOGRAM (TEE);  Surgeon: Larey Dresser, MD;  Location: [facility] ENDOSCOPY;  Service: Cardiovascular;  Laterality: N/A;    SH: Social History  Substance Use Topics  . Smoking status: Current Every Day Smoker    Packs/day: 1.00    Years: 46.00    Types: Cigarettes  . Smokeless tobacco: Never Used     Comment: not ready to quit  . Alcohol use No    MEDS: Prior to Admission medications   Medication Sig Start Date End Date Taking? Authorizing Provider  apixaban (ELIQUIS) 5 MG TABS tablet Take 1 tablet (5 mg total) by mouth 2 (two) times daily. 05/18/15  Yes Sherran Needs, NP  Empagliflozin-Metformin HCl ER (SYNJARDY XR) 11-998 MG TB24 Take 1 tablet by mouth every morning.   Yes Historical  Provider, MD  furosemide (LASIX) 20 MG tablet Take 20 mg by mouth daily.    Yes Historical Provider, MD  ibuprofen (ADVIL,MOTRIN) 800 MG tablet Take 800 mg by mouth 3 (three) times daily as needed for mild pain.    Yes Historical Provider, MD  Multiple Vitamin (MULTIVITAMIN) tablet Take 1 tablet by mouth at bedtime.    Yes Historical Provider, MD  potassium chloride SA (K-DUR,KLOR-CON) 20 MEQ tablet Take 1 tablet by mouth twice daily for next 2 days then reduce to 1 tablet (7meq) daily Patient taking differently: Take 20 mEq by mouth daily.  05/18/15  Yes Sherran Needs, NP  rosuvastatin (CRESTOR) 5 MG tablet Take 5 mg by mouth at bedtime.    Yes Historical Provider, MD  TIKOSYN 500 MCG capsule TAKE 1 CAPSULE BY MOUTH 2 TIMES DAILY. 07/27/15  Yes Thompson Grayer, MD  VALTREX 500 MG tablet Take 500 mg by mouth 2 (two) times daily as needed (for outbreaks).  02/19/13  Yes Historical Provider, MD  zoledronic acid (RECLAST) 5 MG/100ML SOLN injection Inject 5 mg into the vein every 6 (six) months.    Yes Historical Provider, MD  albuterol (PROVENTIL HFA;VENTOLIN HFA) 108 (90 Base) MCG/ACT inhaler Inhale 1-2 puffs into the lungs every 6 (six) hours as needed for wheezing or shortness of breath.    Historical Provider, MD  diazepam (VALIUM) 2 MG tablet Take 2 mg by mouth daily as needed for anxiety.  09/02/12   Historical Provider, MD  enoxaparin (LOVENOX) 100 MG/ML injection Inject 1 mL (100 mg total) into the skin every 12 (twelve) hours. Patient not taking: Reported on 10/20/2015 10/13/15   Thompson Grayer, MD    ALLERGY: Allergies  Allergen Reactions  . Avelox [Moxifloxacin]     unknown  . Chantix [Varenicline]     nightmares  . Fosamax [Alendronate]     Pain all over  . Other     Blood thinners-extreme bleeding  . Statins Other (See Comments)    Muscle pain  . Sulfamethoxazole Nausea And Vomiting    Other reaction(s): Dizziness (intolerance)  . Sulfonamide Derivatives Hives    Nausea vertigo  .  Ciprofloxacin Rash  . Gabapentin Rash    burning  . Tape Itching and Rash    Please use "paper" tape only    ROS: ROS  NEUROLOGIC EXAM: Awake, alert, oriented Memory and concentration grossly intact Speech fluent, appropriate CN grossly intact Motor exam: Upper Extremities Deltoid Bicep Tricep Grip  Right 5/5 5/5 5/5 5/5  Left 5/5 5/5 4/5 5/5   Lower Extremity IP Quad PF DF EHL  Right 5/5 5/5 5/5 5/5 5/5  Left 5/5 5/5 5/5 5/5 5/5   Sensation  grossly intact to LT Increased left BR/patellar reflexes (+) Hoffman's bilaterally Non-sustained clonus bilaterally  IMGAING: Previous MRI of August 2016 was again reviewed.  This demonstrates cervical kyphosis centered at approximately C6.  The primary pathology is at C5 C6 and C6 C7 where there is broad-based, left eccentric disc osteophyte complex with resultant moderately severe stenosis with spinal cord compression.  IMPRESSION: 66 year old woman with cervical spondylotic myelopathy, worst at C5 C6 to C6 C7.  With the loss of normal cervical lordosis, and actually slight kyphotic deformity, and the compressive pathology being primarily disc and PLL, I think an anterior decompression would be best rather than a posterior laminectomy with lateral mass screws.   PLAN: We will plan on proceeding with C6 corpectomy and fusion   I again reviewed the MRI findings with the patient, her husband, and her best friend who is a cardiac nurse in the office.  Indications for surgery given her radiographic and clinical myelopathy were discussed.  Different surgical approaches were also reviewed including anterior versus posterior decompression and fusion.  The risks of surgery were discussed in detail with the patient which include but are not limited to spinal cord injury which may result in hand, leg, and bowel dysfunction, postoperative dysphagia, dysphonia, neck hematoma, or subsequent surgery for epidural hematoma.  The risk of CSF leak was also  discussed. In addition, I explained to him that after spinal fusion surgery, there is a risk of adjacent level disease requiring future surgical intervention. The patient understood our discussion as well as the risks of the surgery and is willing to proceed.  All questions were answered.

## 2016-02-21 NOTE — Anesthesia Procedure Notes (Signed)
Procedure Name: Intubation Date/Time: 02/21/2016 8:56 AM Performed by: Myna Bright Pre-anesthesia Checklist: Patient identified, Emergency Drugs available, Suction available and Patient being monitored Patient Re-evaluated:Patient Re-evaluated prior to inductionOxygen Delivery Method: Circle system utilized Preoxygenation: Pre-oxygenation with 100% oxygen Intubation Type: IV induction Ventilation: Mask ventilation without difficulty and Oral airway inserted - appropriate to patient size Laryngoscope Size: Mac and 3 Grade View: Grade I Tube type: Oral Tube size: 7.0 mm Number of attempts: 1 Airway Equipment and Method: Stylet and LTA kit utilized Placement Confirmation: ETT inserted through vocal cords under direct vision,  positive ETCO2 and breath sounds checked- equal and bilateral Secured at: 21 cm Tube secured with: Tape Dental Injury: Teeth and Oropharynx as per pre-operative assessment

## 2016-02-21 NOTE — Progress Notes (Signed)
Orthopedic Tech Progress Note Patient Details:  Melanie Lucas [birth year] [id number]  Patient ID: [name], female   DOB: August 02, 1950, 66 y.o.   MRN: [id number]   Hildred Priest 02/21/2016, 12:49 PM Called in bio-tech brace order; spoke with Colletta [place]

## 2016-02-21 NOTE — Transfer of Care (Signed)
Immediate Anesthesia Transfer of Care Note  Patient: Melanie Lucas  Procedure(s) Performed: Procedure(s): ANTERIOR CERVICAL CORPECTOMY ANS FUSION CERVICAL SIX , ANTERIOR PLATING CERVICAL FIVE-SEVEN (N/A)  Patient Location: PACU  Anesthesia Type:General  Level of Consciousness: awake, alert , oriented and patient cooperative  Airway & Oxygen Therapy: Patient Spontanous Breathing and Patient connected to nasal cannula oxygen  Post-op Assessment: Report given to RN, Post -op Vital signs reviewed and stable and Patient moving all extremities  Post vital signs: Reviewed and stable  Last Vitals:  Vitals:   02/21/16 0708  BP: (!) 166/64  Pulse: 76  Resp: 18  Temp: 36.6 C    Last Pain:  Vitals:   02/21/16 0708  TempSrc: Oral         Complications: No apparent anesthesia complications

## 2016-02-21 NOTE — Anesthesia Preprocedure Evaluation (Signed)
Anesthesia Evaluation  Patient identified by MRN, date of birth, ID band Patient awake    Reviewed: Allergy & Precautions, H&P , NPO status , Patient's Chart, lab work & pertinent test results  Airway Mallampati: II   Neck ROM: full    Dental   Pulmonary COPD, Current Smoker,    breath sounds clear to auscultation       Cardiovascular hypertension, + CAD  + dysrhythmias Atrial Fibrillation  Rhythm:regular Rate:Normal     Neuro/Psych CVA    GI/Hepatic   Endo/Other  diabetes, Type 2  Renal/GU      Musculoskeletal  (+) Arthritis ,   Abdominal   Peds  Hematology   Anesthesia Other Findings   Reproductive/Obstetrics                             Anesthesia Physical Anesthesia Plan  ASA: III  Anesthesia Plan: General   Post-op Pain Management:    Induction: Intravenous  Airway Management Planned: Oral ETT  Additional Equipment:   Intra-op Plan:   Post-operative Plan: Extubation in OR  Informed Consent: I have reviewed the patients History and Physical, chart, labs and discussed the procedure including the risks, benefits and alternatives for the proposed anesthesia with the patient or authorized representative who has indicated his/her understanding and acceptance.     Plan Discussed with: CRNA, Anesthesiologist and Surgeon  Anesthesia Plan Comments:         Anesthesia Quick Evaluation

## 2016-02-21 NOTE — Anesthesia Postprocedure Evaluation (Signed)
Anesthesia Post Note  Patient: Melanie Lucas  Procedure(s) Performed: Procedure(s) (LRB): ANTERIOR CERVICAL CORPECTOMY ANS FUSION CERVICAL SIX , ANTERIOR PLATING CERVICAL FIVE-SEVEN (N/A)  Patient location during evaluation: PACU Anesthesia Type: General Level of consciousness: awake and alert and patient cooperative Pain management: pain level controlled Vital Signs Assessment: post-procedure vital signs reviewed and stable Respiratory status: spontaneous breathing and respiratory function stable Cardiovascular status: stable Anesthetic complications: no       Last Vitals:  Vitals:   02/21/16 1320 02/21/16 1345  BP: (!) 147/89 (!) 157/79  Pulse: 70 75  Resp: 13 15  Temp:  36.7 C    Last Pain:  Vitals:   02/21/16 1513  TempSrc:   PainSc: Asleep                 Carianne Taira S

## 2016-02-21 NOTE — Op Note (Signed)
PREOP DIAGNOSIS: Cervical Spondylosis with myelopathy, C5-C7  POSTOP DIAGNOSIS: Same  PROCEDURE: 1. Corpectomy at C6 with removal of >50% of the vertebral body for decompression of spinal cord and exiting nerve roots  2. Placement of intervertebral biomechanical device, 88mm Medtronic PEEK lordotic cage 3. Placement of anterior instrumentation consisting of interbody plate and screws - NuVasive Archon 58mm plate C5-C7 4. Use of morselized bone autograft 5. Arthrodesis C5-C7, anterior interbody technique  6. Use of intraoperative microscope  SURGEON: Dr. Consuella Lose, MD  ASSISTANT: Dr. Cyndy Freeze, MD  ANESTHESIA: General Endotracheal  EBL: 200cc  SPECIMENS: None  DRAINS: None  COMPLICATIONS: None immediate  CONDITION:  Hemodynamically stable to PACU  HISTORY: Melanie Lucas is a 66 y.o. woman initially presented to the outpatient neurosurgery clinic with a year ago with neck pain, and exam findings consistent with myelopathy. Her MRI scan did demonstrate moderate to severe spinal stenosis from C5-C7. Treatment options were discussed including my recommendation for surgical decompression and fusion. The patient actually obtain second opinion from [facility], but elected to proceed with anterior decompression and fusion. Risks and benefits were discussed in detail with the patient and her family. After all questions were answered, informed consent was obtained and witnessed.  PROCEDURE IN DETAIL: The patient was brought to the operating room and transferred to the operative table. After induction of general anesthesia, the patient was positioned on the operative table in the supine position with all pressure points meticulously padded. The skin of the neck was then prepped and draped in the usual sterile fashion.  After timeout was conducted, the skin was infiltrated with local anesthetic. Skin incision was then made sharply and Bovie electrocautery was used to  dissect the subcutaneous tissue until the platysma was identified. The platysma was then divided and undermined. The sternocleidomastoid muscle was then identified and, utilizing natural fascial planes in the neck, the prevertebral fascia was identified and the carotid sheath was retracted laterally and the trachea and esophagus retracted medially. Again using fluoroscopy, the correct disc spaces were identified. Bovie electrocautery was used to dissect in the subperiosteal plane and elevate the bilateral longus coli muscles. Self-retaining retractors were then placed. At this point, the microscope was draped and brought into the field, and the remainder of the case was done under the microscope using microdissecting technique.  The C5-6 disc space was incised sharply after anterior osteophytes were removed, and rongeurs were use to initially complete a discectomy. The high-speed drill was then used to complete discectomy until the posterior annulus was identified and removed and the posterior longitudinal ligament was identified. In a similar fashion, attention was turned to the C6-7 disc space, where the disc was initially incised, superficial discectomy was completed with rongeurs, and a high-speed drill was used to complete discectomy. Posterior annulus was removed, and the posterior longitudinal ligament was identified.  At this point, the C6 vertebral body was drilled away with a high-speed drill, bone dust was collected for autograft for use during arthrodesis. Once the posterior cortex was drilled away, the posterior longitudinal ligament was identified behind the C6 vertebral body. Nerve hook was then used to elevate the PLL, and Kerrison rongeurs were used to completely remove the posterior longitudinal ligament. The PLL was noted to be significantly calcified, and compressive on the lateral aspect on the left at C5-6, extending into the left 56 foramen and compressing the exiting left C6 nerve root.  After all the PLL was removed and foraminotomy was completed, the  nerve roots at C5-6 and C6-7 were visualized and completely decompressed. Posterior osteophytes were removed behind the inferior portion of the C5 vertebral body as well as the superior portion of the C7 vertebral body.  At this point, a 85mm interbody cage was sized and packed with morcellized bone autograft collected during decompression. This was then inserted and tapped into place.  After placement of the intervertebral device, the anterior cervical plate was selected, and placed across the interspaces. Using a high-speed drill, the cortex of the cervical vertebral bodies was punctured, and screws inserted in the C5 and C7 levels. Final fluoroscopic images in lateral projections were taken to confirm good hardware placement.  At this point, after all counts were verified to be correct, meticulous hemostasis was secured using a combination of bipolar electrocautery and passive hemostatics. The platysma muscle was then closed using interrupted 3-0 Vicryl sutures, and the skin was closed with a interrupted subcuticular stitch. Sterile dressings were then applied and the drapes removed.  The patient tolerated the procedure well and was extubated in the room and taken to the postanesthesia care unit in stable condition.

## 2016-02-22 LAB — BASIC METABOLIC PANEL
Anion gap: 9 (ref 5–15)
BUN: 11 mg/dL (ref 6–20)
CO2: 26 mmol/L (ref 22–32)
Calcium: 8.6 mg/dL — ABNORMAL LOW (ref 8.9–10.3)
Chloride: 105 mmol/L (ref 101–111)
Creatinine, Ser: 0.74 mg/dL (ref 0.44–1.00)
GFR calc non Af Amer: >60 mL/min (ref >60)
Glucose, Bld: 140 mg/dL — ABNORMAL HIGH (ref 65–99)
POTASSIUM: 3.6 mmol/L (ref 3.5–5.1)
SODIUM: 140 mmol/L (ref 135–145)

## 2016-02-22 LAB — GLUCOSE, CAPILLARY
GLUCOSE-CAPILLARY: 160 mg/dL — AB (ref 65–99)
GLUCOSE-CAPILLARY: 136 mg/dL — AB (ref 65–99)
Glucose-Capillary: 220 mg/dL — ABNORMAL HIGH (ref 65–99)

## 2016-02-22 LAB — MAGNESIUM: MAGNESIUM: 1.9 mg/dL (ref 1.7–2.4)

## 2016-02-22 NOTE — Care Management Note (Signed)
Case Management Note  Patient Details  Name: KANDY STEUBER MRN: [id number] Date of Birth: 26-Feb-1950  Subjective/Objective:  Pt underwent: ANTERIOR CERVICAL CORPECTOMY ANS FUSION CERVICAL SIX , ANTERIOR PLATING CERVICAL FIVE-SEVEN. She is from home with her spouse.                   Action/Plan: Awaiting PT/OT recommendations. CM following for d/c needs.   Expected Discharge Date:                  Expected Discharge Plan:     In-House Referral:     Discharge planning Services     Post Acute Care Choice:    Choice offered to:     DME Arranged:    DME Agency:     HH Arranged:    HH Agency:     Status of Service:  In process, will continue to follow  If discussed at Long Length of Stay Meetings, dates discussed:    Additional Comments:  Pollie Friar, RN 02/22/2016, 11:55 AM

## 2016-02-22 NOTE — Evaluation (Signed)
Physical Therapy Evaluation Patient Details Name: Melanie Lucas MRN: [id number] DOB: [birth year] Today's Date: 02/22/2016   History of Present Illness  66 y.o. female admitted to [facility] on 02/21/15 for elective C5-7 decompression and fusion (anterior).  Pt with significant PMHx of stroke, PAF, OA, MI, meniere disease, HTN, lacunar infarct, HTN, CVD, A-fib, CAD, COPD, and chronic lymphedema.    Clinical Impression  Pt is mobilizing well.  Cervical education and handout given.  Pt needs cues functionally to comply to cervical precautions.  PT to sign off and I encouraged her to walk TID in hallways with supervision until d/c.  PT to sign off.     Follow Up Recommendations No PT follow up    Equipment Recommendations  None recommended by PT    Recommendations for Other Services   NA    Precautions / Restrictions Precautions Precautions: Cervical Precaution Comments: Hanout given and reviewed Required Braces or Orthoses: Cervical Brace Cervical Brace: Hard collar;At all times Restrictions Weight Bearing Restrictions: No      Mobility  Bed Mobility               General bed mobility comments: Pt seated EOB, reviewed log roll and reverse log roll to decrease strain.   Transfers Overall transfer level: Modified independent Equipment used: None                Ambulation/Gait Ambulation/Gait assistance: Modified independent (Device/Increase time) Ambulation Distance (Feet): 200 Feet Assistive device: None Gait Pattern/deviations: Step-through pattern;Staggering right;Staggering left Gait velocity: decreased Gait velocity interpretation: Below normal speed for age/gender General Gait Details: very mildly staggering gait pattern, pt able to self correct.  She walked close to the rail in the hallway to be safe.    Stairs Stairs: Yes Stairs assistance: Modified independent (Device/Increase time) Stair Management: One rail Right;Forwards;Alternating pattern Number of  Stairs: 5 General stair comments: mod I used rail to steady herself, slower due to unable to look down at her feet.          Balance Overall balance assessment: Needs assistance Sitting-balance support: Feet supported;No upper extremity supported Sitting balance-Leahy Scale: Good     Standing balance support: No upper extremity supported Standing balance-Leahy Scale: Good                               Pertinent Vitals/Pain Pain Assessment: 0-10 Pain Score: 8  Pain Location: throat is an 8, surgery is 7 Pain Descriptors / Indicators: Aching;Burning Pain Intervention(s): Limited activity within patient's tolerance;Monitored during session;Repositioned    Home Living Family/patient expects to be discharged to:: Private residence Living Arrangements: Spouse/significant other;Children;Other relatives Available Help at Discharge: Family Type of Home: House Home Access: Stairs to enter Entrance Stairs-Rails: Psychiatric nurse of Steps: 3 Home Layout: One level (has two levels, but daughter and grandson live upstairs) Home Equipment: Environmental consultant - 2 wheels;Grab bars - tub/shower;Other (comment) (garden tub)      Prior Function Level of Independence: Independent                  Extremity/Trunk Assessment   Upper Extremity Assessment Upper Extremity Assessment: Defer to OT evaluation    Lower Extremity Assessment Lower Extremity Assessment: Overall WFL for tasks assessed    Cervical / Trunk Assessment Cervical / Trunk Assessment: Other exceptions Cervical / Trunk Exceptions: post op cervical fusion  Communication   Communication: No difficulties  Cognition Arousal/Alertness: Awake/alert Behavior During Therapy: [facility]  for tasks assessed/performed Overall Cognitive Status: Within Functional Limits for tasks assessed                             Assessment/Plan    PT Assessment Patent does not need any further PT services             PT Goals (Current goals can be found in the Care Plan section)  Acute Rehab PT Goals PT Goal Formulation: All assessment and education complete, DC therapy               End of Session Equipment Utilized During Treatment: Cervical collar Activity Tolerance: Patient limited by pain Patient left: in bed;Other (comment) (seated EOB) Nurse Communication: Other (comment) (she is interested in throat spray for painful throat)         Time: [id number] PT Time Calculation (min) (ACUTE ONLY): 17 min   Charges:   PT Evaluation $PT Eval Moderate Complexity: 1 Procedure          [name] B. LeRoy, [place], DPT [phone]   02/22/2016, 12:00 PM

## 2016-02-22 NOTE — Progress Notes (Signed)
No issues overnight. Pt has no significant c/o, tolerating diet well.  EXAM:  BP (!) 141/68 (BP Location: Left Arm)   Pulse 79   Temp 98.6 F (37 C) (Oral)   Resp 18   Ht 5\' 7"  (1.702 m)   Wt 85.4 kg (188 lb 5 oz)   SpO2 97%   BMI 29.49 kg/m   Awake, alert, oriented  Speech fluent, appropriate  CN grossly intact  5/5 BUE/BLE  Neck soft  IMPRESSION:  66 y.o. female POD#1 s/p C6 corpectomy, doing well. Has Afib with high CHADS-2 score, cardiology would like to restart Eliquis ASAP.  PLAN: - Will cont to monitor here, may restart anticoagulation tomorrow

## 2016-02-22 NOTE — Progress Notes (Signed)
OT Cancellation Note  Patient Details Name: Melanie Lucas MRN: [id number] DOB: 06/14/1950   Cancelled Treatment:    Reason Eval/Treat Not Completed: Patient declined, no reason specified Declined stating "ill be here til Friday. No need to rush. Now is not a good time." pt unable to define reason for refusal and states "they just gave me medication a little while ago."  OT educated on the need to complete evaluation and pt thanking therapist and asking to hold evaluation. OT to attempt at a more appropriate time.   Vonita Moss   OTR/L Pager: [phone] Office: [phone] .  02/22/2016, 2:12 PM

## 2016-02-23 ENCOUNTER — Encounter (HOSPITAL_COMMUNITY): Payer: Self-pay | Admitting: Neurosurgery

## 2016-02-23 LAB — GLUCOSE, CAPILLARY
GLUCOSE-CAPILLARY: 222 mg/dL — AB (ref 65–99)
Glucose-Capillary: 187 mg/dL — ABNORMAL HIGH (ref 65–99)
Glucose-Capillary: 221 mg/dL — ABNORMAL HIGH (ref 65–99)
Glucose-Capillary: 107 mg/dL — ABNORMAL HIGH (ref 65–99)

## 2016-02-23 MED ORDER — APIXABAN 5 MG PO TABS
5.0000 mg | ORAL_TABLET | Freq: Two times a day (BID) | ORAL | Status: DC
  Administered 2016-02-23 – 2016-02-24 (×2): 5 mg via ORAL
  Filled 2016-02-23 (×2): qty 1

## 2016-02-23 MED ORDER — PANTOPRAZOLE SODIUM 40 MG PO TBEC
40.0000 mg | DELAYED_RELEASE_TABLET | Freq: Every day | ORAL | Status: DC
  Administered 2016-02-23: 40 mg via ORAL
  Filled 2016-02-23: qty 1

## 2016-02-23 NOTE — Progress Notes (Addendum)
No issues overnight. Pt has no complaints today. Ambulating well.  EXAM:  BP (!) 147/70 (BP Location: Right Arm)   Pulse 62   Temp 98.3 F (36.8 C) (Oral)   Resp 18   Ht 5\' 7"  (1.702 m)   Wt 85.4 kg (188 lb 5 oz)   SpO2 100%   BMI 29.49 kg/m   Awake, alert, oriented  Speech fluent, appropriate  CN grossly intact  5/5 BUE/BLE  Neck soft, wound c/d/i  IMPRESSION:  66 y.o. female POD# 2 s/p C6 corpectomy, doing well. Will need anticoag today for afib.  PLAN: - Start eliquis - Will observe for another day on anticoagulation, likely d/c tomorrow

## 2016-02-23 NOTE — Progress Notes (Signed)
Inpatient Diabetes Program Recommendations  AACE/ADA: New Consensus Statement on Inpatient Glycemic Control (2015)  Target Ranges:  Prepandial:   less than 140 mg/dL      Peak postprandial:   less than 180 mg/dL (1-2 hours)      Critically ill patients:  140 - 180 mg/dL   Results for Melanie Lucas, Melanie Lucas (MRN [id number]) as of 02/23/2016 09:17  Ref. Range 10/13/2015 14:53  Hemoglobin A1C Latest Ref Range: 4.8 - 5.6 % 10.4 (H)   Review of Glycemic Control  Diabetes history: DM2 Outpatient Diabetes medications: Synjardy XR 11-998 mg QAM Current orders for Inpatient glycemic control: Novolog 0-15 units TID with meals, Synjardy XR 11-998 mg QAM  Inpatient Diabetes Program Recommendations: Correction (SSI): Please consider ordering CBG and Novolog 0-5 untis QHS for bedtime correction scale. HgbA1C: Please consider ordering an A1C to evaluate glycemic control over the past 2-3 months.  Thanks, Barnie Alderman, RN, MSN, CDE Diabetes Coordinator Inpatient Diabetes Program [phone] (Team Pager from 8am to 5pm)

## 2016-02-23 NOTE — Progress Notes (Signed)
Occupational Therapy Evaluation Patient Details Name: Melanie Lucas MRN: [id number] DOB: 21-Apr-1950 Today's Date: 02/23/2016    History of Present Illness 66 y.o. female admitted to [facility] on 02/21/15 for elective C5-7 decompression and fusion (anterior).  Pt with significant PMHx of stroke, PAF, OA, MI, meniere disease, HTN, lacunar infarct, HTN, CVD, A-fib, CAD, COPD, and chronic lymphedema.     Clinical Impression   Completed all education regarding cervical precautions for ADL and IADL tasks. Pt verbalized understanding. Pt safe to DC home with intermittent S when medically stable.    Follow Up Recommendations  No OT follow up;Supervision - Intermittent    Equipment Recommendations  None recommended by OT    Recommendations for Other Services       Precautions / Restrictions Precautions Precautions: Cervical Precaution Comments: Hanout given and reviewed Required Braces or Orthoses: Cervical Brace Cervical Brace: Hard collar;At all times Restrictions Weight Bearing Restrictions: No      Mobility Bed Mobility Overal bed mobility: Needs Assistance             General bed mobility comments: vc for correct log rolling technique. Pt states she will use this technique when she is home as this is "how ai got out of bed prior to surgery"  Transfers Overall transfer level: Modified independent Equipment used: None                  Balance Overall balance assessment: Needs assistance Sitting-balance support: Feet supported;No upper extremity supported Sitting balance-Leahy Scale: Good     Standing balance support: No upper extremity supported Standing balance-Leahy Scale: Good                              ADL Overall ADL's : Needs assistance/impaired                                     Functional mobility during ADLs: Modified independent General ADL Comments: Completed educaiton with pt regaridng compensatory technqieus for ADL  to adhere to cervical precautions. Pt verblaizes understanding of precautions but requries mod vc to adhere to precuations during funcitonal tasks.     Vision     Perception     Praxis      Pertinent Vitals/Pain Pain Assessment: 0-10 Pain Score: 2  Pain Descriptors / Indicators: Aching;Burning     Hand Dominance     Extremity/Trunk Assessment Upper Extremity Assessment Upper Extremity Assessment: Overall WFL for tasks assessed   Lower Extremity Assessment Lower Extremity Assessment: Defer to PT evaluation   Cervical / Trunk Assessment Cervical / Trunk Assessment: Other exceptions Cervical / Trunk Exceptions: post op cervical fusion   Communication Communication Communication: No difficulties   Cognition Arousal/Alertness: Awake/alert Behavior During Therapy: WFL for tasks assessed/performed Overall Cognitive Status: Within Functional Limits for tasks assessed                     General Comments       Exercises       Shoulder Instructions      Home Living Family/patient expects to be discharged to:: Private residence Living Arrangements: Spouse/significant other;Children;Other relatives Available Help at Discharge: Family Type of Home: House Home Access: Stairs to enter [employer] of Steps: 3 Entrance Stairs-Rails: Right;Left Home Layout: One level (has two levels, but daughter and grandson live upstairs)  Bathroom Shower/Tub: Teacher, early years/pre: Standard     Home Equipment: Environmental consultant - 2 wheels;Grab bars - tub/shower;Other (comment) (garden tub)          Prior Functioning/Environment Level of Independence: Independent                 OT Problem List: Decreased activity tolerance;Pain;Decreased safety awareness;Decreased knowledge of precautions   OT Treatment/Interventions:      OT Goals(Current goals can be found in the care plan section) Acute Rehab OT Goals Patient Stated Goal: to go home OT Goal  Formulation: All assessment and education complete, DC therapy  OT Frequency:     Barriers to D/C:            Co-evaluation              End of Session Equipment Utilized During Treatment: Cervical collar Nurse Communication: Mobility status  Activity Tolerance: Patient tolerated treatment well Patient left: in bed;with call bell/phone within reach   Time: [id number] OT Time Calculation (min): 12 min Charges:  OT General Charges $OT Visit: 1 Procedure OT Evaluation $OT Eval Low Complexity: 1 Procedure G-Codes:    Jashua Knaak,HILLARY 02-25-16, 1:29 PM   [facility], OT/L  [id number] 2016/02/25

## 2016-02-24 LAB — GLUCOSE, CAPILLARY
GLUCOSE-CAPILLARY: 192 mg/dL — AB (ref 65–99)
Glucose-Capillary: 153 mg/dL — ABNORMAL HIGH (ref 65–99)

## 2016-02-24 MED ORDER — OXYCODONE-ACETAMINOPHEN 5-325 MG PO TABS: 1.0000 | ORAL_TABLET | ORAL | 0 refills | Status: DC | PRN

## 2016-02-24 MED ORDER — METHOCARBAMOL 500 MG PO TABS: 500.0000 mg | ORAL_TABLET | Freq: Four times a day (QID) | ORAL | 0 refills | Status: DC | PRN

## 2016-02-24 NOTE — Care Management Note (Signed)
Case Management Note  Patient Details  Name: Melanie Lucas MRN: [id number] Date of Birth: Feb 29, 1950  Subjective/Objective:                    Action/Plan: Pt discharging home with self care. No f/u or DME needs per PT/OT. Pt with [facility] Medicare insurance. No further needs per CM.   Expected Discharge Date:                  Expected Discharge Plan:  Home/Self Care  In-House Referral:     Discharge planning Services     Post Acute Care Choice:    Choice offered to:     DME Arranged:    DME Agency:     HH Arranged:    [place] Agency:     Status of Service:  Completed, signed off  If discussed at [employer] of Stay Meetings, dates discussed:    Additional Comments:  Pollie Friar, RN 02/24/2016, 11:45 AM

## 2016-02-24 NOTE — Progress Notes (Signed)
Inpatient Diabetes Program Recommendations  AACE/ADA: New Consensus Statement on Inpatient Glycemic Control (2015)  Target Ranges:  Prepandial:   less than 140 mg/dL      Peak postprandial:   less than 180 mg/dL (1-2 hours)      Critically ill patients:  140 - 180 mg/dL   Results for Melanie Lucas, Melanie Lucas (MRN [id number]) as of 02/24/2016 07:18  Ref. Range 02/23/2016 06:43 02/23/2016 11:30 02/23/2016 16:47 02/23/2016 21:25  Glucose-Capillary Latest Ref Range: 65 - 99 mg/dL 187 (H) 221 (H) 107 (H) 222 (H)   Review of Glycemic Control Diabetes history: DM2 Outpatient Diabetes medications: Synjardy XR 11-998 mg QAM Current orders for Inpatient glycemic control: Novolog 0-15 units TID with meals, Synjardy XR 11-998 mg QAM  Inpatient Diabetes Program Recommendations: Correction (SSI): Please consider ordering CBG and Novolog 0-5 untis QHS for bedtime correction scale. HgbA1C: Please consider ordering an A1C to evaluate glycemic control over the past 2-3 months.  Thanks, Barnie Alderman, RN, MSN, CDE Diabetes Coordinator Inpatient Diabetes Program [phone] (Team Pager from 8am to 5pm)

## 2016-02-24 NOTE — Care Management Important Message (Signed)
Important Message  Patient Details  Name: Melanie Lucas MRN: [id number] Date of Birth: 11/06/1950   Medicare Important Message Given:  Yes    Orbie Pyo 02/24/2016, 12:53 PM

## 2016-02-24 NOTE — Discharge Summary (Signed)
Physician Discharge Summary  Patient ID: Melanie Lucas MRN: [id number] DOB/AGE: 1950-10-01 66 y.o.  Admit date: 02/21/2016 Discharge date: 02/24/2016  Admission Diagnoses: Cervical spondylosis with myelopathy  Discharge Diagnoses: Same Active Problems:   Cervical spondylosis with myelopathy   Discharged Condition: Stable  Hospital Course:  [name] is a 66 y.o. female electively admitted after C6 corpectomy and fusion. Postoperatively, the patient was at neurologic baseline. She was tolerating diet, ambulating independently, voiding normally, with pain controlled with oral medication. She has Afib and was restarted on her Eliquis after 48 hrs. She was observed for another 24 hours without evidence of hematoma.   Treatments: Surgery - C6 corpectomy and fusion.  Discharge Exam: Blood pressure (!) 145/60, pulse 60, temperature 98.5 F (36.9 C), temperature source Oral, resp. rate 18, height 5\' 7"  (1.702 m), weight 85.1 kg (187 lb 9.6 oz), SpO2 95 %. Awake, alert, oriented Speech fluent, appropriate CN grossly intact 5/5 BUE/BLE Wound c/d/i, neck soft  Follow-up: Follow-up in my office [facility] [phone]) in 2-3 weeks  Disposition: 01-Home or Self Care   Allergies as of 02/24/2016      Reactions   Avelox [moxifloxacin]    unknown   Chantix [varenicline]    nightmares   Fosamax [alendronate]    Pain all over   Other    Blood thinners-extreme bleeding   Statins Other (See Comments)   Muscle pain   Sulfamethoxazole Nausea And Vomiting   Other reaction(s): Dizziness (intolerance)   Sulfonamide Derivatives Hives   Nausea vertigo   Ciprofloxacin Rash   Gabapentin Rash   burning   Tape Itching, Rash   Please use "paper" tape only      Medication List    STOP taking these medications   enoxaparin 100 MG/ML injection Commonly known as:  LOVENOX     TAKE these medications   albuterol 108 (90 Base) MCG/ACT inhaler Commonly known as:   PROVENTIL HFA;VENTOLIN HFA Inhale 1-2 puffs into the lungs every 6 (six) hours as needed for wheezing or shortness of breath.   apixaban 5 MG Tabs tablet Commonly known as:  ELIQUIS Take 1 tablet (5 mg total) by mouth 2 (two) times daily.   diazepam 2 MG tablet Commonly known as:  VALIUM Take 2 mg by mouth daily as needed for anxiety.   furosemide 20 MG tablet Commonly known as:  LASIX Take 20 mg by mouth daily.   ibuprofen 800 MG tablet Commonly known as:  ADVIL,MOTRIN Take 800 mg by mouth 3 (three) times daily as needed for mild pain.   methocarbamol 500 MG tablet Commonly known as:  ROBAXIN Take 1 tablet (500 mg total) by mouth every 6 (six) hours as needed for muscle spasms.   multivitamin tablet Take 1 tablet by mouth at bedtime.   oxyCODONE-acetaminophen 5-325 MG tablet Commonly known as:  PERCOCET/ROXICET Take 1-2 tablets by mouth every 4 (four) hours as needed for moderate pain.   potassium chloride SA 20 MEQ tablet Commonly known as:  K-DUR,KLOR-CON Take 1 tablet by mouth twice daily for next 2 days then reduce to 1 tablet (21meq) daily What changed:  how much to take  how to take this  when to take this  additional instructions   RECLAST 5 MG/100ML Soln injection Generic drug:  zoledronic acid Inject 5 mg into the vein every 6 (six) months.   rosuvastatin 5 MG tablet Commonly known as:  CRESTOR Take 5 mg by mouth at bedtime.  SYNJARDY XR 11-998 MG Tb24 Generic drug:  Empagliflozin-Metformin HCl ER Take 1 tablet by mouth every morning.   TIKOSYN 500 MCG capsule Generic drug:  dofetilide TAKE 1 CAPSULE BY MOUTH 2 TIMES DAILY.   VALTREX 500 MG tablet Generic drug:  valACYclovir Take 500 mg by mouth 2 (two) times daily as needed (for outbreaks).      Follow-up Information    Melanie Lucas, C, MD Follow up in 2 week(s).   Specialty:  Neurosurgery Contact information: [address] [place]  [address] [phone]           Signed: Consuella Lose, Loletha Grayer 02/24/2016, 8:49 AM

## 2016-02-24 NOTE — Progress Notes (Signed)
Patient is discharged from room 5C16 at this time. Alert and in stable condition. IV site d/c'd and instructions read to patient with understanding verbalized. left unit via wheelchair with husband and all belongings at side.

## 2016-04-03 ENCOUNTER — Other Ambulatory Visit (HOSPITAL_COMMUNITY): Payer: Self-pay | Admitting: Nurse Practitioner

## 2016-04-10 ENCOUNTER — Ambulatory Visit: Payer: Medicare Other | Admitting: Dietician

## 2016-04-12 ENCOUNTER — Other Ambulatory Visit: Payer: Self-pay | Admitting: Family Medicine

## 2016-04-13 ENCOUNTER — Other Ambulatory Visit: Payer: Self-pay | Admitting: Family Medicine

## 2016-04-13 DIAGNOSIS — G8929 Other chronic pain: Secondary | ICD-10-CM

## 2016-04-13 DIAGNOSIS — R109 Unspecified abdominal pain: Principal | ICD-10-CM

## 2016-04-17 ENCOUNTER — Ambulatory Visit
Admission: RE | Admit: 2016-04-17 | Discharge: 2016-04-17 | Disposition: A | Discharge status: Home / Self Care | Payer: Medicare Other | Source: Ambulatory Visit | Attending: Family Medicine | Admitting: Family Medicine

## 2016-04-17 DIAGNOSIS — G8929 Other chronic pain: Secondary | ICD-10-CM

## 2016-04-17 DIAGNOSIS — R109 Unspecified abdominal pain: Principal | ICD-10-CM

## 2016-04-17 MED ORDER — IOPAMIDOL (ISOVUE-370) INJECTION 76%
80.0000 mL | Freq: Once | INTRAVENOUS | Status: AC | PRN
  Administered 2016-04-17: 80 mL via INTRAVENOUS

## 2016-05-08 ENCOUNTER — Other Ambulatory Visit (HOSPITAL_COMMUNITY): Payer: Self-pay | Admitting: Nurse Practitioner

## 2016-05-14 ENCOUNTER — Other Ambulatory Visit: Payer: Self-pay | Admitting: Family Medicine

## 2016-05-14 DIAGNOSIS — Z1231 Encounter for screening mammogram for malignant neoplasm of breast: Secondary | ICD-10-CM

## 2016-05-31 ENCOUNTER — Ambulatory Visit
Admission: RE | Admit: 2016-05-31 | Discharge: 2016-05-31 | Disposition: A | Discharge status: Home / Self Care | Payer: Medicare Other | Source: Ambulatory Visit | Attending: Family Medicine | Admitting: Family Medicine

## 2016-05-31 DIAGNOSIS — Z1231 Encounter for screening mammogram for malignant neoplasm of breast: Secondary | ICD-10-CM

## 2016-06-28 ENCOUNTER — Other Ambulatory Visit (HOSPITAL_COMMUNITY): Payer: Self-pay | Admitting: Nurse Practitioner

## 2016-10-10 NOTE — Addendum Note (Signed)
Addendum  created 10/10/16 1110 by Albertha Ghee, MD   Sign clinical note

## 2016-10-10 NOTE — Anesthesia Postprocedure Evaluation (Signed)
Anesthesia Post Note  Patient: Melanie Lucas  Procedure(s) Performed: Procedure(s) (LRB): ANTERIOR CERVICAL CORPECTOMY ANS FUSION CERVICAL SIX , ANTERIOR PLATING CERVICAL FIVE-SEVEN (N/A)     Anesthesia Post Evaluation  Last Vitals:  Vitals:   02/24/16 0350 02/24/16 0958  BP: (!) 145/60 (!) 160/74  Pulse: 60 68  Resp: 18 20  Temp: 36.9 C 36.8 C  SpO2: 95% 96%    Last Pain:  Vitals:   02/24/16 0958  TempSrc: Oral  PainSc:                  [place]

## 2016-10-15 ENCOUNTER — Other Ambulatory Visit: Payer: Self-pay | Admitting: Internal Medicine

## 2016-11-14 ENCOUNTER — Other Ambulatory Visit: Payer: Self-pay | Admitting: Internal Medicine

## 2016-11-26 ENCOUNTER — Encounter: Payer: Self-pay | Admitting: Internal Medicine

## 2016-11-26 ENCOUNTER — Ambulatory Visit (INDEPENDENT_AMBULATORY_CARE_PROVIDER_SITE_OTHER): Payer: Medicare Other | Admitting: Internal Medicine

## 2016-11-26 VITALS — BP 122/64 | HR 52 | Ht 67.0 in | Wt 168.6 lb

## 2016-11-26 DIAGNOSIS — I10 Essential (primary) hypertension: Secondary | ICD-10-CM

## 2016-11-26 DIAGNOSIS — F17200 Nicotine dependence, unspecified, uncomplicated: Secondary | ICD-10-CM

## 2016-11-26 DIAGNOSIS — I4891 Unspecified atrial fibrillation: Secondary | ICD-10-CM

## 2016-11-26 DIAGNOSIS — I2510 Atherosclerotic heart disease of native coronary artery without angina pectoris: Secondary | ICD-10-CM

## 2016-11-26 NOTE — Patient Instructions (Signed)
Medication Instructions:  Your physician recommends that you continue on your current medications as directed. Please refer to the Current Medication list given to you today.   Labwork: Your physician recommends that you return for lab work today (CBC, BMET, MAG)   Testing/Procedures: None ordered   Follow-Up: Your physician wants you to follow-up in: 6 months with Dr. Meda Coffee and 12 months with Dr. Rayann Heman. You will receive a reminder letter in the mail two months in advance. If you don't receive a letter, please call our office to schedule the follow-up appointment.   Any Other Special Instructions Will Be Listed Below (If Applicable).  Avoid Ibuprofen   If you need a refill on your cardiac medications before your next appointment, please call your pharmacy.

## 2016-11-26 NOTE — Progress Notes (Signed)
PCP: Dineen Kid, MD Primary Cardiologist: Dr Meda Coffee Primary EP: Dr Bolivar Haw ARBELL Lucas is a 66 y.o. female who presents today for routine electrophysiology followup.  Since last being seen in our clinic, the patient reports doing very well.  No symptoms of afib in over a year on tikosyn.  She has lost a lot of weight!  Today, she denies symptoms of palpitations, chest pain, shortness of breath,  lower extremity edema, dizziness, presyncope, or syncope.  The patient is otherwise without complaint today.   Past Medical History:  Diagnosis Date  . Cancer (HCC)    Skin- leg   . Chronic acquired lymphedema    a. R>L;  b. 03/2012 Neg LE U/S for DVT. Legs since age 75  . Complication of anesthesia    woke up during colonoscopy, Lithrostrixpy, Biospy  . COPD (chronic obstructive pulmonary disease) ([place])   . Coronary artery disease   . CVD (cardiovascular disease)   . Diabetes mellitus    Type II  . Dysrhythmia    afib  . Elevated TSH    a. 10/2012 - inst to f/u PCP.  [name] Hematuria    a. while on pradaxa,  she reports that she has seen Dr Margie Ege and had low risk cystoscopy  . History of kidney stones   . Hyperlipidemia   . Hypertension    patient denies  . Lacunar infarction    a. 02/2009 non-acute Lacunar infarct of the right thalamus noted on MRI of brain.  [name] Lymphedema of extremity    had this problem, since she was a teenager. especially seen in R LE  . Meniere disease   . Myocardial infarction ([place])   . Obesity   . Osteoarthritis    cervical & lumbar region, knees, hands cramp also   . PAF with post-termination pauses    a. on dronedarone;  b. CHA2DS2VASc = 5 (HTN, DM, h/o lacunar infarct on MRI, Female) ->refused oral anticoagulation after h/o hematuria on pradaxa;  c. 02/2012 Echo: EF 55-60%, mildly dil LA. D. Recurrent PAF 10/2012 after only taking Multaq 1x/day - spont conv to NSR, placed back on BID Multaq/eliquis;  e. 10/2014 Multaq d/c'd->tikosyn initiated.  . Shortness of  breath dyspnea    with exertion  . Stroke [facility])    2 mini strokes  . Tobacco abuse    Past Surgical History:  Procedure Laterality Date  . ANTERIOR CERVICAL CORPECTOMY N/A 02/21/2016   Procedure: ANTERIOR CERVICAL CORPECTOMY ANS FUSION CERVICAL SIX , ANTERIOR PLATING CERVICAL FIVE-SEVEN;  Surgeon: Consuella Lose, MD;  Location: [place];  Service: Neurosurgery;  Laterality: N/A;  . ANTERIOR FUSION CERVICAL SPINE  02/21/2016  . Biopsy of adrenal glands    . BREAST BIOPSY Bilateral    benign results, both breasts   . CARDIAC CATHETERIZATION  2014   x1 stent placed, done in [place]   . CARDIOVERSION N/A 11/11/2014   Procedure: CARDIOVERSION;  Surgeon: Larey Dresser, MD;  Location: [place];  Service: Cardiovascular;  Laterality: N/A;  . COLONOSCOPY W/ POLYPECTOMY    . CORONARY STENT PLACEMENT    . TEE WITHOUT CARDIOVERSION N/A 11/11/2014   Procedure: TRANSESOPHAGEAL ECHOCARDIOGRAM (TEE);  Surgeon: Larey Dresser, MD;  Location: [place];  Service: Cardiovascular;  Laterality: N/A;    ROS- all systems are reviewed and negatives except as per HPI above  Current Outpatient Prescriptions  Medication Sig Dispense Refill  . albuterol (PROVENTIL HFA;VENTOLIN HFA) 108 (90 Base) MCG/ACT inhaler Inhale 1-2  puffs into the lungs every 6 (six) hours as needed for wheezing or shortness of breath.    . diazepam (VALIUM) 2 MG tablet Take 2 mg by mouth daily as needed for anxiety.     . dofetilide (TIKOSYN) 500 MCG capsule Take 1 capsule (500 mcg total) by mouth 2 (two) times daily. 180 capsule 0  . ELIQUIS 5 MG TABS tablet TAKE 1 TABLET (5 MG TOTAL) BY MOUTH 2 (TWO) TIMES DAILY. 60 tablet 0  . Empagliflozin-Metformin HCl ER (SYNJARDY XR) 11-998 MG TB24 Take 1 tablet by mouth 2 (two) times daily.     . furosemide (LASIX) 20 MG tablet Take 20 mg by mouth daily.     [name] ibuprofen (ADVIL,MOTRIN) 800 MG tablet Take 800 mg by mouth 3 (three) times daily as needed for mild pain.     .  methocarbamol (ROBAXIN) 500 MG tablet Take 1 tablet (500 mg total) by mouth every 6 (six) hours as needed for muscle spasms. 50 tablet 0  . Multiple Vitamin (MULTIVITAMIN) tablet Take 1 tablet by mouth at bedtime.     [name] oxyCODONE-acetaminophen (PERCOCET/ROXICET) 5-325 MG tablet Take 1-2 tablets by mouth every 4 (four) hours as needed for moderate pain. 50 tablet 0  . rosuvastatin (CRESTOR) 5 MG tablet Take 5 mg by mouth at bedtime.     [name] VALTREX 500 MG tablet Take 500 mg by mouth 2 (two) times daily as needed (for outbreaks).     . zoledronic acid (RECLAST) 5 MG/100ML SOLN injection Inject 5 mg into the vein every 6 (six) months.      No current facility-administered medications for this visit.     Physical Exam: Vitals:   11/26/16 1459  BP: 122/64  Pulse: (!) 52  SpO2: 95%  Weight: 168 lb 9.6 oz (76.5 kg)  Height: 5\' 7"  (1.702 m)    GEN- The patient is well appearing, alert and oriented x 3 today.   Head- normocephalic, atraumatic Eyes-  Sclera clear, conjunctiva pink Ears- hearing intact Oropharynx- clear Lungs- Clear to ausculation bilaterally, normal work of breathing Heart- Regular rate and rhythm, no murmurs, rubs or gallops, PMI not laterally displaced GI- soft, NT, ND, + BS Extremities- no clubbing, cyanosis, or edema  EKG tracing ordered today is personally reviewed and shows sinus rhythm 53 bpm, PR 120 msec with short PR, RBBB, LAHB  Assessment and Plan:  1. Paroxysmal atrial fibrillation Stable on tikosyn chads2vasc score is 5.  Continue eliquis  2. CAD No ischemic symptoms No changes  3. HTN Stable No change required today  4. Tobacco Cessation advised She is not ready to quit  5. Obesity Body mass index is 26.41 kg/m. Very pleased with her weight loss!  Return to see Dr Meda Coffee in 6 months I will see in a year   Thompson Grayer MD, [facility] 11/26/2016 3:27 PM

## 2016-11-27 LAB — BASIC METABOLIC PANEL
BUN/Creatinine Ratio: 26 (ref 12–28)
BUN: 15 mg/dL (ref 8–27)
CALCIUM: 9 mg/dL (ref 8.7–10.3)
CO2: 27 mmol/L (ref 20–29)
CREATININE: 0.57 mg/dL (ref 0.57–1.00)
Chloride: 102 mmol/L (ref 96–106)
GFR calc Af Amer: 110 mL/min/{1.73_m2} (ref >59)
GFR, EST NON AFRICAN AMERICAN: 96 mL/min/{1.73_m2} (ref >59)
Glucose: 237 mg/dL — ABNORMAL HIGH (ref 65–99)
Potassium: 3.9 mmol/L (ref 3.5–5.2)
SODIUM: 142 mmol/L (ref 134–144)

## 2016-11-27 LAB — CBC WITH DIFFERENTIAL/PLATELET
Basophils Absolute: 0 10*3/uL (ref 0.0–0.2)
Basos: 1 %
EOS (ABSOLUTE): 0.2 10*3/uL (ref 0.0–0.4)
EOS: 4 %
HEMATOCRIT: 43.5 % (ref 34.0–46.6)
HEMOGLOBIN: 14.8 g/dL (ref 11.1–15.9)
Immature Grans (Abs): 0 10*3/uL (ref 0.0–0.1)
Immature Granulocytes: 0 %
LYMPHS ABS: 2.2 10*3/uL (ref 0.7–3.1)
Lymphs: 34 %
MCH: 31 pg (ref 26.6–33.0)
MCHC: 34 g/dL (ref 31.5–35.7)
MCV: 91 fL (ref 79–97)
MONOCYTES: 9 %
Monocytes Absolute: 0.6 10*3/uL (ref 0.1–0.9)
Neutrophils Absolute: 3.4 10*3/uL (ref 1.4–7.0)
Neutrophils: 52 %
Platelets: 180 10*3/uL (ref 150–379)
RBC: 4.78 x10E6/uL (ref 3.77–5.28)
RDW: 14 % (ref 12.3–15.4)
WBC: 6.4 10*3/uL (ref 3.4–10.8)

## 2016-11-27 LAB — MAGNESIUM: Magnesium: 2.2 mg/dL (ref 1.6–2.3)

## 2016-11-29 ENCOUNTER — Telehealth: Payer: Self-pay | Admitting: *Deleted

## 2016-11-29 NOTE — Telephone Encounter (Signed)
-----   Message from Thompson Grayer, MD sent at 11/28/2016 10:03 AM EDT ----- Results reviewed.  Claiborne Billings, please inform pt of result.  She needs to follow-up with PCP for elevated glucose

## 2016-11-29 NOTE — Telephone Encounter (Signed)
LM ON VM TO CALL BACK FOR RESULTS

## 2016-11-29 NOTE — Telephone Encounter (Signed)
-----   Message from Thompson Grayer, MD sent at 11/28/2016 10:03 AM EDT ----- Results reviewed.  Melanie Lucas, please inform pt of result.  She needs to follow-up with PCP for elevated glucose

## 2016-11-29 NOTE — Telephone Encounter (Signed)
PT IS AWARE OF RESULTS AND WILL FOLLOW UP WITH PCP ABOUT ELEVATED GLUCOSE LEVELS

## 2016-12-28 ENCOUNTER — Other Ambulatory Visit: Payer: Self-pay | Admitting: Internal Medicine

## 2017-03-14 ENCOUNTER — Other Ambulatory Visit (HOSPITAL_COMMUNITY): Payer: Self-pay | Admitting: Cardiology

## 2017-03-14 NOTE — Telephone Encounter (Signed)
Eliquis 5mg  refill received; pt is 67 yrs old, wt-76.5kg, Crea-0.57 on 11/26/16, last seen by Dr. Rayann Heman on 11/26/16; refill sent per request.

## 2017-05-14 ENCOUNTER — Encounter: Payer: Self-pay | Admitting: Neurology

## 2017-05-28 ENCOUNTER — Ambulatory Visit: Payer: Medicare Other | Admitting: Cardiology

## 2017-05-28 ENCOUNTER — Encounter: Payer: Self-pay | Admitting: Cardiology

## 2017-05-28 VITALS — BP 138/72 | HR 53 | Ht 67.0 in | Wt 167.4 lb

## 2017-05-28 DIAGNOSIS — I2510 Atherosclerotic heart disease of native coronary artery without angina pectoris: Secondary | ICD-10-CM

## 2017-05-28 DIAGNOSIS — F17200 Nicotine dependence, unspecified, uncomplicated: Secondary | ICD-10-CM

## 2017-05-28 DIAGNOSIS — E785 Hyperlipidemia, unspecified: Secondary | ICD-10-CM

## 2017-05-28 DIAGNOSIS — I480 Paroxysmal atrial fibrillation: Secondary | ICD-10-CM | POA: Diagnosis not present

## 2017-05-28 DIAGNOSIS — I10 Essential (primary) hypertension: Secondary | ICD-10-CM | POA: Diagnosis not present

## 2017-05-28 NOTE — Patient Instructions (Signed)

## 2017-05-28 NOTE — Progress Notes (Signed)
Cardiology Office Note    Date:  05/28/2017   ID:  Melanie Lucas, DOB Mar 22, 1950, MRN [id number]  PCP:  Dineen Kid, MD  Cardiologist:  Ena Dawley, MD   Chief complain:   History of Present Illness:  Melanie Lucas is a 67 y.o. female patient of Dr. Rayann Heman who has a history of chronic atrial fib controlled with Multaq. She stopped her anticoagulation back in October 2014 because of hematuria on Pradaxa and was reluctant to restart. She also continues to smoke. She has CHA2Ds2VASc=5 (HTN,DM, H/O lacunar infarcton MRI, female Echo 02/2012 EF 55-60%, mildly dilated LA.  Patient was traveling to an [place] in [place] and suffered an MI. Dr. Carlis Stable called our office to discuss her history as she was transferred to a [facility] in [place]. She underwent stenting of a first diagonal that was 95% occluded. She received bare-metal stent because of her history of not taking anticoagulants and hematuria. She was supposed to be sent home on Plavix and aspirin but was never given the Plavix. Since she's been home she denies any chest pain, palpitations, dyspnea, dyspnea on exertion, dizziness, or presyncope. She is trying to cut back on her smoking and is down to 3 cigarettes daily.  10/19/2015 - the patient is coming after 2 years in the meantime she was being followed by Dr. Rayann Heman for paroxysmal atrial fibrillation and he eventually loaded her withTikosyn that is efficient and well tolerated. She is coming today for preop clearance for spinal stenosis surgery. She states that she hasn't had any chest pain on and has dyspnea on exertion only with significant exertion. She continues to travel and walks miles a day, she recently visited [place] where she walked a lot and afterwards was able to walk in [place] without significant symptoms. She denies orthopnea or paroxysmal nocturnal dyspnea. She has chronic lymphedema more on prominent on her left right lower extremity ever since she was 67 year old  she states is 42 in her family her aunt had similar problem. She denies any recent palpitations or syncope. She has been compliant with her Crestor and has no side effects, her PCP follow her lipids and there had been at goal. No bleeding with Eliquis. She is being bridged with Lovenox prior to her spinal surgery.  05/28/17 - 1 year follow up, she has lost 60 pounds in one year. She has been doing well, denies any chest pain, shortness of breath no palpitations. She has been compliant with Eliquis and denies any bleeding. Her concerns are chronic issues with bilateral lymphedema mostly affecting her feet, because of that she is unable to exercise. She has also developed significant and shaking approximately 8 months ago and is scheduled to see neurologist next week.  Past Medical History:  Diagnosis Date  . Cancer (HCC)    Skin- leg   . Chronic acquired lymphedema    a. R>L;  b. 03/2012 Neg LE U/S for DVT. Legs since age 31  . Complication of anesthesia    woke up during colonoscopy, Lithrostrixpy, Biospy  . COPD (chronic obstructive pulmonary disease) ([place])   . Coronary artery disease   . CVD (cardiovascular disease)   . Diabetes mellitus    Type II  . Dysrhythmia    afib  . Elevated TSH    a. 10/2012 - inst to f/u PCP.  [name] Hematuria    a. while on pradaxa,  she reports that she has seen Dr Margie Ege and had low risk  cystoscopy  . History of kidney stones   . Hyperlipidemia   . Hypertension    patient denies  . Lacunar infarction ([place])    a. 02/2009 non-acute Lacunar infarct of the right thalamus noted on MRI of brain.  [name] Lymphedema of extremity    had this problem, since she was a teenager. especially seen in [place]  . Meniere disease   . Myocardial infarction ([place])   . Obesity   . Osteoarthritis    cervical & lumbar region, knees, hands cramp also   . PAF with post-termination pauses    a. on dronedarone;  b. CHA2DS2VASc = 5 (HTN, DM, h/o lacunar infarct on MRI, Female) ->refused oral  anticoagulation after h/o hematuria on pradaxa;  c. 02/2012 Echo: EF 55-60%, mildly dil LA. D. Recurrent PAF 10/2012 after only taking Multaq 1x/day - spont conv to NSR, placed back on BID Multaq/eliquis;  e. 10/2014 Multaq d/c'd->tikosyn initiated.  . Shortness of breath dyspnea    with exertion  . Stroke [facility])    2 mini strokes  . Tobacco abuse     Past Surgical History:  Procedure Laterality Date  . ANTERIOR CERVICAL CORPECTOMY N/A 02/21/2016   Procedure: ANTERIOR CERVICAL CORPECTOMY ANS FUSION CERVICAL SIX , ANTERIOR PLATING CERVICAL FIVE-SEVEN;  Surgeon: Consuella Lose, MD;  Location: [facility];  Service: Neurosurgery;  Laterality: N/A;  . ANTERIOR FUSION CERVICAL SPINE  02/21/2016  . Biopsy of adrenal glands    . BREAST BIOPSY Bilateral    benign results, both breasts   . CARDIAC CATHETERIZATION  2014   x1 stent placed, done in [place]   . CARDIOVERSION N/A 11/11/2014   Procedure: CARDIOVERSION;  Surgeon: Larey Dresser, MD;  Location: [facility];  Service: Cardiovascular;  Laterality: N/A;  . COLONOSCOPY W/ POLYPECTOMY    . CORONARY STENT PLACEMENT    . TEE WITHOUT CARDIOVERSION N/A 11/11/2014   Procedure: TRANSESOPHAGEAL ECHOCARDIOGRAM (TEE);  Surgeon: Larey Dresser, MD;  Location[facility] ENDOSCOPY;  Service: Cardiovascular;  Laterality: N/A;    Current Medications: Outpatient Medications Prior to Visit  Medication Sig Dispense Refill  . albuterol (PROVENTIL HFA;VENTOLIN HFA) 108 (90 Base) MCG/ACT inhaler Inhale 1-2 puffs into the lungs every 6 (six) hours as needed for wheezing or shortness of breath.    . diazepam (VALIUM) 2 MG tablet Take 2 mg by mouth daily as needed for anxiety.     . dofetilide (TIKOSYN) 500 MCG capsule Take 1 capsule (500 mcg total) 2 (two) times daily by mouth. 180 capsule 2  . DULoxetine (CYMBALTA) 30 MG capsule Take 1 capsule by mouth daily.    [name] ELIQUIS 5 MG TABS tablet TAKE 1 TABLET (5 MG TOTAL) BY MOUTH 2 (TWO) TIMES DAILY. 60 tablet 9  .  Empagliflozin-Metformin HCl ER (SYNJARDY XR) 11-998 MG TB24 Take 1 tablet by mouth 2 (two) times daily.     . furosemide (LASIX) 20 MG tablet Take 20 mg by mouth daily.     [name] ibuprofen (ADVIL,MOTRIN) 800 MG tablet Take 800 mg by mouth 3 (three) times daily as needed for mild pain.     . methocarbamol (ROBAXIN) 500 MG tablet Take 1 tablet (500 mg total) by mouth every 6 (six) hours as needed for muscle spasms. 50 tablet 0  . Multiple Vitamin (MULTIVITAMIN) tablet Take 1 tablet by mouth at bedtime.     [name] oxyCODONE-acetaminophen (PERCOCET/ROXICET) 5-325 MG tablet Take 1-2 tablets by mouth every 4 (four) hours as needed for moderate pain. [place]  tablet 0  . rosuvastatin (CRESTOR) 5 MG tablet Take 5 mg by mouth at bedtime.     [name] VALTREX 500 MG tablet Take 500 mg by mouth 2 (two) times daily as needed (for outbreaks).     . zoledronic acid (RECLAST) 5 MG/100ML SOLN injection Inject 5 mg into the vein every 6 (six) months.     . dofetilide (TIKOSYN) 500 MCG capsule Take 1 capsule (500 mcg total) by mouth 2 (two) times daily. (Patient not taking: Reported on 05/28/2017) 180 capsule 0  . ELIQUIS 5 MG TABS tablet TAKE 1 TABLET (5 MG TOTAL) BY MOUTH 2 (TWO) TIMES DAILY. (Patient not taking: Reported on 05/28/2017) 60 tablet 0   No facility-administered medications prior to visit.      Allergies:   Avelox [moxifloxacin]; Chantix [varenicline]; Fosamax [alendronate]; Other; Statins; Sulfamethoxazole; Sulfonamide derivatives; Ciprofloxacin; Gabapentin; and Tape   Social History   Socioeconomic History  . Marital status: Married    Spouse name: Not on file  . Number of children: Not on file  . Years of education: Not on file  . Highest education level: Not on file  Occupational History  . Not on file  Social Needs  . Financial resource strain: Not on file  . Food insecurity:    Worry: Not on file    Inability: Not on file  . Transportation needs:    Medical: Not on file    Non-medical: Not on file    Tobacco Use  . Smoking status: Current Every Day Smoker    Packs/day: 1.00    Years: 46.00    Pack years: 46.00    Types: Cigarettes  . Smokeless tobacco: Never Used  . Tobacco comment: not ready to quit  Substance and Sexual Activity  . Alcohol use: No  . Drug use: No  . Sexual activity: Never  Lifestyle  . Physical activity:    Days per week: Not on file    Minutes per session: Not on file  . Stress: Not on file  Relationships  . Social connections:    Talks on phone: Not on file    Gets together: Not on file    Attends religious service: Not on file    Active member of club or organization: Not on file    Attends meetings of clubs or organizations: Not on file    Relationship status: Not on file  Other Topics Concern  . Not on file  Social History Narrative   She operates an entertainment business    Family History:  The patient's family history includes Breast cancer in her maternal aunt, maternal aunt, and paternal aunt; Cancer in her mother; Coronary artery disease in her father; Heart attack in her father; Leukemia in her brother.   ROS:   Please see the history of present illness.    ROS All other systems reviewed and are negative.   PHYSICAL EXAM:   VS:  BP 138/72   Pulse (!) 53   Ht 5\' 7"  (1.702 m)   Wt 167 lb 6.4 oz (75.9 kg)   SpO2 97%   BMI 26.22 kg/m    GEN: Well nourished, well developed, in no acute distress  HEENT: normal  Neck: no JVD, carotid bruits, or masses Cardiac: RRR; no murmurs, rubs, or gallops, Lymphedema up to her knees, R>L  Respiratory:  clear to auscultation bilaterally, normal work of breathing GI: soft, nontender, nondistended, + BS MS: no deformity or atrophy  Skin: warm and dry, no  rash Neuro:  Alert and Oriented x 3, Strength and sensation are intact Psych: euthymic mood, full affect  Wt Readings from Last 3 Encounters:  05/28/17 167 lb 6.4 oz (75.9 kg)  11/26/16 168 lb 9.6 oz (76.5 kg)  02/24/16 187 lb 9.6 oz (85.1 kg)     Studies/Labs Reviewed:   EKG:  EKG is ordered today.  The ekg ordered today demonstrates SB, PACs, RBBB, LAFB, unchanged from prior.  Recent Labs: 11/26/2016: BUN 15; Creatinine, Ser 0.57; Hemoglobin 14.8; Magnesium 2.2; Platelets 180; Potassium 3.9; Sodium 142   Lipid Panel    Component Value Date/Time   CHOL 115 11/11/2012 0605   TRIG 112 11/11/2012 0605   HDL 44 11/11/2012 0605   CHOLHDL 2.6 11/11/2012 0605   VLDL 22 11/11/2012 0605   LDLCALC 49 11/11/2012 0605   TTE: 10/2014  - Left ventricle: The cavity size was normal. Systolic function was   normal. The estimated ejection fraction was in the range of 50%   to 55%. Wall motion was normal; there were no regional wall   motion abnormalities. The study was not technically sufficient to   allow evaluation of LV diastolic dysfunction due to atrial   fibrillation. - Aortic valve: Trileaflet; normal thickness leaflets. There was no   regurgitation. - Aortic root: The aortic root was normal in size. - Ascending aorta: The ascending aorta was normal in size. - Mitral valve: Structurally normal valve. There was mild   regurgitation. - Left atrium: The atrium was normal in size. - Right ventricle: The cavity size was normal. Wall thickness was   normal. Systolic function was normal. - Tricuspid valve: There was mild-moderate regurgitation. - Pulmonary arteries: Systolic pressure was mildly increased. PA   peak pressure: 37 mm Hg (S). - Inferior vena cava: The vessel was normal in size. - Pericardium, extracardiac: There was no pericardial effusion.   ASSESSMENT:    1. Coronary artery disease involving native coronary artery of native heart without angina pectoris   2. Hyperlipidemia, unspecified hyperlipidemia type   3. Essential hypertension, benign   4. PAF (paroxysmal atrial fibrillation) ([place])   5. TOBACCO ABUSE     PLAN:   1. Coronary artery disease - status post PCI with placement of bare-metal stents to the  diagonal branch in [place] in January of 2015. On Eliquis, no ASA. She is taking Crestor 5 mg daily, no beta blockers due to 2 COPD with wheezing. The patient is currently asymptomatic. It was reinforced that she should stop smoking.  2. Essential hypertension - controlled on current regimen.  3. A - fib - paroxysmal, chads2vasc score is 5 Controlled presently on Tikosyn and Eliquis, remains in sinus rhythm and has no bleeding. Followed by Dr Rayann Heman. EKG today unchanged.  4. COPD - no recent wheezing, but continues to smoke.  5. Mild pulmonary hypertension - device to stop smoking.  6. Hand shaking - I have checked for side effects of tickle seen in it's not listed,she is seeing neurology next week.  7. Chronic bilateral lymphedema, since her 20'. Non-responsive to diuretics or compression socks.  Medication Adjustments/Labs and Tests Ordered: Current medicines are reviewed at length with the patient today.  Concerns regarding medicines are outlined above.  Medication changes, Labs and Tests ordered today are listed in the Patient Instructions below. Patient Instructions  Medication Instructions:   Your physician recommends that you continue on your current medications as directed. Please refer to the Current Medication list given to you today.  Follow-Up:  Your physician wants you to follow-up in:[place] will receive a reminder letter in the mail two months in advance. If you don't receive a letter, please call our office to schedule the follow-up appointment.        If you need a refill on your cardiac medications before your next appointment, please call your pharmacy.      Signed, Ena Dawley, MD  05/28/2017 3:29 PM    [address], [place], [place]  [address] Phone: [phone]; Fax: [phone]

## 2017-05-30 NOTE — Progress Notes (Signed)
Subjective:   Melanie Lucas was seen in consultation in the movement disorder clinic at the request of Traci Sermon, PA*.  Pt is a 67 y.o. female with chronic neck pain, having seen neurosx at [facility] in 2016 and here in 2019, presents with c/o tremor.  This patient is accompanied in the office by her spouse who supplements the history.The evaluation is for tremor.  Tremor started approximately 2 years ago and involves the bilateral UE, L more than R.  She notes that she is R hand dominant.  Tremor is most noticeable when using the hand, esp with handwriting.   There is no family hx of tremor.    Affected by caffeine:  No. Affected by alcohol: doesn't drink enough to know (drinks one time per month) Affected by stress:  Yes.   Affected by fatigue:  No. Spills soup if on spoon:  Yes.  , because eats with the right hand.   Affects ADL's (tying shoes, brushing teeth, etc):  Yes.   (mostly with makeup) Tremor inducer: has albuterol but uses rarely  Current/Previously tried tremor medications: propranolol - about one year ago - ? Dose but thought that tremor got worse.  States that her cardiologist told her to quit taking it because slowed HR too much.  She states that it also caused hypoxemia  Current medications that may exacerbate tremor:  albuterol  Outside reports reviewed: historical medical records, office notes and referral letter/letters.  Allergies  Allergen Reactions  . Avelox [Moxifloxacin]     unknown  . Chantix [Varenicline]     nightmares  . Fosamax [Alendronate]     Pain all over  . Other     Blood thinners-extreme bleeding  . Statins Other (See Comments) and Nausea And Vomiting    Muscle pain Other reaction(s): Dizziness (intolerance)  . Sulfamethoxazole Nausea And Vomiting    Other reaction(s): Dizziness (intolerance)  . Sulfonamide Derivatives Hives    Nausea vertigo  . Ciprofloxacin Rash  . Gabapentin Rash    burning  . Tape Itching and Rash    Please  use "paper" tape only    Outpatient Encounter Medications as of 06/03/2017  Medication Sig  . diazepam (VALIUM) 2 MG tablet Take 2 mg by mouth daily as needed for anxiety.   . dofetilide (TIKOSYN) 500 MCG capsule Take 1 capsule (500 mcg total) 2 (two) times daily by mouth.  . DULoxetine (CYMBALTA) 30 MG capsule Take 1 capsule by mouth every other day.   [name] ELIQUIS 5 MG TABS tablet TAKE 1 TABLET (5 MG TOTAL) BY MOUTH 2 (TWO) TIMES DAILY.  . Empagliflozin-Metformin HCl ER (SYNJARDY XR) 11-998 MG TB24 Take 1 tablet by mouth 2 (two) times daily.   . furosemide (LASIX) 20 MG tablet Take 20 mg by mouth daily.   [name] ibuprofen (ADVIL,MOTRIN) 800 MG tablet Take 800 mg by mouth 3 (three) times daily as needed for mild pain.   . Multiple Vitamin (MULTIVITAMIN) tablet Take 1 tablet by mouth at bedtime.   . rosuvastatin (CRESTOR) 5 MG tablet Take 5 mg by mouth at bedtime.   . zoledronic acid (RECLAST) 5 MG/100ML SOLN injection Inject 5 mg into the vein every 6 (six) months.   [name] albuterol (PROVENTIL HFA;VENTOLIN HFA) 108 (90 Base) MCG/ACT inhaler Inhale 1-2 puffs into the lungs every 6 (six) hours as needed for wheezing or shortness of breath.  [name] VALTREX 500 MG tablet Take 500 mg by mouth 2 (two) times daily as needed (for  outbreaks).   . [DISCONTINUED] methocarbamol (ROBAXIN) 500 MG tablet Take 1 tablet (500 mg total) by mouth every 6 (six) hours as needed for muscle spasms.  . [DISCONTINUED] oxyCODONE-acetaminophen (PERCOCET/ROXICET) 5-325 MG tablet Take 1-2 tablets by mouth every 4 (four) hours as needed for moderate pain.   No facility-administered encounter medications on file as of 06/03/2017.     Past Medical History:  Diagnosis Date  . Cancer (HCC)    Skin- leg   . Chronic acquired lymphedema    a. R>L;  b. 03/2012 Neg LE U/S for DVT. Legs since age 73  . Complication of anesthesia    woke up during colonoscopy, Lithrostrixpy, Biosp[facility]  . COPD (chronic obstructive pulmonary disease) ([place])   .  Coronary artery disease   . CVD (cardiovascular disease)   . Diabetes mellitus    Type II  . Dysrhythmia    afib  . Elevated TSH    a. 10/2012 - inst to f/u PCP.  [name] Hematuria    a. while on pradaxa,  she reports that she has seen Dr Margie Ege and had low risk cystoscopy  . History of kidney stones   . Hyperlipidemia   . Hypertension    patient denies  . Lacunar infarction ([place])    a. 02/2009 non-acute Lacunar infarct of the right thalamus noted on MRI of brain.  [name] Lymphedema of extremity    had this problem, since she was a teenager. especially seen in R LE  . Meniere disease   . Myocardial infarction ([place])   . Obesity   . Osteoarthritis    cervical & lumbar region, knees, hands cramp also   . PAF with post-termination pauses    a. on dronedarone;  b. CHA2DS2VASc = 5 (HTN, DM, h/o lacunar infarct on MRI, Female) ->refused oral anticoagulation after h/o hematuria on pradaxa;  c. 02/2012 Echo: EF 55-60%, mildly dil LA. D. Recurrent PAF 10/2012 after only taking Multaq 1x/day - spont conv to NSR, placed back on BID Multaq/eliquis;  e. 10/2014 Multaq d/c'd->tikosyn initiated.  . Shortness of breath dyspnea    with exertion  . Stroke [facility])    2 mini strokes  . Tobacco abuse     Past Surgical History:  Procedure Laterality Date  . ANTERIOR CERVICAL CORPECTOMY N/A 02/21/2016   Procedure: ANTERIOR CERVICAL CORPECTOMY ANS FUSION CERVICAL SIX , ANTERIOR PLATING CERVICAL FIVE-SEVEN;  Surgeon: Consuella Lose, MD;  Location: [facility];  Service: Neurosurgery;  Laterality: N/A;  . ANTERIOR FUSION CERVICAL SPINE  02/21/2016  . Biopsy of adrenal glands    . BREAST BIOPSY Bilateral    benign results, both breasts   . CARDIAC CATHETERIZATION  2014   x1 stent placed, done in [place]   . CARDIOVERSION N/A 11/11/2014   Procedure: CARDIOVERSION;  Surgeon: Larey Dresser, MD;  Location: [facility];  Service: Cardiovascular;  Laterality: N/A;  . COLONOSCOPY W/ POLYPECTOMY    . CORONARY STENT PLACEMENT     . TEE WITHOUT CARDIOVERSION N/A 11/11/2014   Procedure: TRANSESOPHAGEAL ECHOCARDIOGRAM (TEE);  Surgeon: Larey Dresser, MD;  Location: [facility] ENDOSCOPY;  Service: Cardiovascular;  Laterality: N/A;    Social History   Socioeconomic History  . Marital status: Married    Spouse name: Not on file  . Number of children: Not on file  . Years of education: Not on file  . Highest education level: Not on file  Occupational History  . Not on file  Social Needs  . Financial resource strain:  Not on file  . Food insecurity:    Worry: Not on file    Inability: Not on file  . Transportation needs:    Medical: Not on file    Non-medical: Not on file  Tobacco Use  . Smoking status: Current Every Day Smoker    Packs/day: 1.00    Years: 55.00    Pack years: 55.00    Types: Cigarettes  . Smokeless tobacco: Never Used  . Tobacco comment: not ready to quit  Substance and Sexual Activity  . Alcohol use: Yes    Comment: 1 beer per month  . Drug use: No  . Sexual activity: Never  Lifestyle  . Physical activity:    Days per week: Not on file    Minutes per session: Not on file  . Stress: Not on file  Relationships  . Social connections:    Talks on phone: Not on file    Gets together: Not on file    Attends religious service: Not on file    Active member of club or organization: Not on file    Attends meetings of clubs or organizations: Not on file    Relationship status: Not on file  . Intimate partner violence:    Fear of current or ex partner: Not on file    Emotionally abused: Not on file    Physically abused: Not on file    Forced sexual activity: Not on file  Other Topics Concern  . Not on file  Social History Narrative   She operates an entertainment business    Family Status  Relation Name Status  . Father  Deceased at age 54       MI  . Mother  Deceased  . Brother  Deceased       Leukemia  . MGM  Deceased  . MGF  Deceased  . PGM  Deceased  . PGF  Deceased  .  Daughter 2 Alive  . Mat Aunt  (Not Specified)  . Ethlyn Daniels  (Not Specified)  . Mat Aunt  (Not Specified)    Review of Systems Has chronic SOB with COPD.  Has lymphedema and makes R leg feel weak.  Paresthesias below the knees - feels like water running on the legs.  DM x 6 years.  A1C was 10 and states that it is closer to 8 now.  Admits to 60# weight loss after a neck surgery but leveled out now.  A complete 10 system ROS was obtained and was negative apart from what is mentioned.   Objective:   VITALS:   Vitals:   06/03/17 1423  BP: 140/80  Pulse: (!) 56  SpO2: 97%  Weight: 165 lb (74.8 kg)  Height: 5\' 7"  (1.702 m)   Gen:  Appears stated age and in NAD. HEENT:  Normocephalic, atraumatic. The mucous membranes are moist. The superficial temporal arteries are without ropiness or tenderness. Cardiovascular: Loletha Grayer.  regular Lungs: Clear to auscultation bilaterally. Neck: There are no carotid bruits noted bilaterally. Skin: There is significant lower extremity edema, right greater than left.  The right includes the foot and leg, whereas the left is primarily the foot.  NEUROLOGICAL:  Orientation:  The patient is alert and oriented x 3.  She does look to her husband for the finer aspects of the history.  Recent and remote memory are intact.  Attention span and concentration are normal.  Able to name objects and repeat without trouble.   Cranial nerves: There is  good facial symmetry. The pupils are equal round and reactive to light bilaterally. Fundoscopic exam reveals clear disc margins bilaterally. Extraocular muscles are intact and visual fields are full to confrontational testing. Speech is fluent and clear. Soft palate rises symmetrically and there is no tongue deviation. Hearing is decreased to conversational tone. Tone: Tone is good throughout. Sensation: Sensation is intact to light touch and pinprick throughout (facial, trunk, extremities). Vibration is intact at the bilateral big  toe, but it is overall decreased distally. There is no extinction with double simultaneous stimulation. There is no sensory dermatomal level identified. Coordination:  The patient has no dysdiadichokinesia or dysmetria. Motor: Strength is 5/5 in the bilateral upper and lower extremities.  Shoulder shrug is equal bilaterally.  There is no pronator drift.  There are no fasciculations noted. DTR's: Deep tendon reflexes are 2/4 at the bilateral biceps, triceps, brachioradialis, patella and absent at the bilateral achilles.  Plantar responses are downgoing bilaterally. Gait and Station: The patient is able to ambulate without difficulty. The patient is able to heel toe walk without any difficulty. The patient has mild trouble ambulating in a tandem fashion.  She is able to stand in the Romberg position with eyes open and sways with eyes closed.  MOVEMENT EXAM: Tremor:  There is tremor in the UE, noted most significantly on the left with doing something such as turning the page of a magazine.  It does not get worse when she holds a weight and may get somewhat better.  Tremor is evident when she reports water from one glass to another, but she does not spill the water.       Assessment/Plan:   1.  Essential Tremor.  -Hers is asymmetric, but this certainly can happen in about 30% of cases.  We discussed nature and pathophysiology.  We discussed that this can continue to gradually get worse with time.  We discussed that some medications can worsen this, as can caffeine use.  We discussed medication therapy as well as surgical therapy.  I explained to the patient that her options are incredibly limited, and virtually none at this point.  She cannot have primidone due to interaction with Eliquis for a-fib (unless that was changed to Pradaxa).  She is not a candidate for a beta-blocker given COPD and bradycardia (tried propranolol in the past and got too bradycardic).  She cannot have topiramate because of her  history of kidney stones.  She lists dependent as an allergy.  I really do not think that would be in her best interest to start something highly anticholinergic such as trihexyphenidyl.  I did talk to her about focused ultrasound surgery.  She really is not interested in that.  I do not think she would be a candidate for DBS surgery.  She asks me to contact her cardiologist (Dr. Meda Coffee and Dr. Rayann Heman) to see if there are other options for her Eliquis  2.  Peripheral neuropathy  -The patient has clinical examination evidence of a diffuse peripheral neuropathy, which certainly can affect gait and balance.  This is likely due to long term uncontrolled DM.  We discussed safety associated with peripheral neuropathy.  We discussed balance therapy and the importance of ambulatory assistive device for balance assistance.  We discussed the importance of controlling her diabetes.  She asked about medications.  As above, she lists gabapentin as an allergy.  I would be nervous about trying Lyrica, if not for the allergy to gabapentin, but also because it can  contribute to lower extremity edema, which she has significant amounts of due to lymphedema.  She is already on Cymbalta.  This dosage could be increased if her endocrinologist feels appropriate.  3.  Tobacco abuse  -Currently smoking 1.0 packs/day    - Patient was informed of the dangers of tobacco abuse including stroke, cancer, and MI, as well as benefits of tobacco cessation.  4.  Follow-up will depend on whether or not we start medication, depending on the response from the cardiologist.   CC:  Via, Lennette Bihari, MD

## 2017-06-03 ENCOUNTER — Encounter: Payer: Self-pay | Admitting: Neurology

## 2017-06-03 ENCOUNTER — Telehealth: Payer: Self-pay | Admitting: Neurology

## 2017-06-03 ENCOUNTER — Ambulatory Visit: Payer: Medicare Other | Admitting: Neurology

## 2017-06-03 VITALS — BP 140/80 | HR 56 | Ht 67.0 in | Wt 165.0 lb

## 2017-06-03 DIAGNOSIS — G250 Essential tremor: Secondary | ICD-10-CM

## 2017-06-03 DIAGNOSIS — I480 Paroxysmal atrial fibrillation: Secondary | ICD-10-CM

## 2017-06-03 DIAGNOSIS — Z720 Tobacco use: Secondary | ICD-10-CM | POA: Diagnosis not present

## 2017-06-03 DIAGNOSIS — E1142 Type 2 diabetes mellitus with diabetic polyneuropathy: Secondary | ICD-10-CM | POA: Diagnosis not present

## 2017-06-03 NOTE — Telephone Encounter (Signed)
Melanie Lucas - let pt know that Dr. Meda Coffee said it will be okay to change eliquis to pradaxa and she can call that office for details.  Once doing well in a month or so on that, I can add primidone.  Call her in a month please and then if okay on pradaxa, add primidone with f/u here in 3-4 months

## 2017-06-03 NOTE — Telephone Encounter (Signed)
-----   Message from Dorothy Spark, MD sent at 06/03/2017  3:53 PM EDT ----- Pradaxa would be ok  ----- Message ----- From: Ludwig Clarks, DO Sent: 06/03/2017   3:35 PM To: Dorothy Spark, MD  Good afternoon, Drs. Allred and Nelson.  Pt came to see me for tremor today and, as written in details of note, I have no options for her.  She asks me about changing eliquis so she can take primidone, and told her I would ask you if pradaxa an option.  I told her that it certainly may not be.  Her tremor, from my standpoint, is not terrible, but she is bothered.  She cannot have most of tremor meds due to her hx of copd, kidney stones, allergies, etc.  Thank you for your input!  Wells Guiles

## 2017-06-04 NOTE — Telephone Encounter (Signed)
Patient made aware. She states she doesn't think she can take Pradaxa. She will talk to Cardiology and let us know. Will talk in a month if she starts Pradaxa.

## 2017-06-23 ENCOUNTER — Emergency Department (HOSPITAL_COMMUNITY): Payer: Medicare Other

## 2017-06-23 ENCOUNTER — Inpatient Hospital Stay (HOSPITAL_COMMUNITY): Payer: Medicare Other

## 2017-06-23 ENCOUNTER — Inpatient Hospital Stay (HOSPITAL_COMMUNITY)
Admission: EM | Admit: 2017-06-23 | Discharge: 2017-06-24 | DRG: 069 | Disposition: A | Discharge status: Home / Self Care | Payer: Medicare Other | Attending: Internal Medicine | Admitting: Internal Medicine

## 2017-06-23 DIAGNOSIS — F17210 Nicotine dependence, cigarettes, uncomplicated: Secondary | ICD-10-CM | POA: Diagnosis present

## 2017-06-23 DIAGNOSIS — Z6825 Body mass index (BMI) 25.0-25.9, adult: Secondary | ICD-10-CM | POA: Diagnosis not present

## 2017-06-23 DIAGNOSIS — G8929 Other chronic pain: Secondary | ICD-10-CM | POA: Diagnosis present

## 2017-06-23 DIAGNOSIS — E1140 Type 2 diabetes mellitus with diabetic neuropathy, unspecified: Secondary | ICD-10-CM | POA: Diagnosis present

## 2017-06-23 DIAGNOSIS — I482 Chronic atrial fibrillation: Secondary | ICD-10-CM | POA: Diagnosis not present

## 2017-06-23 DIAGNOSIS — Z87442 Personal history of urinary calculi: Secondary | ICD-10-CM

## 2017-06-23 DIAGNOSIS — M1990 Unspecified osteoarthritis, unspecified site: Secondary | ICD-10-CM | POA: Diagnosis not present

## 2017-06-23 DIAGNOSIS — I4891 Unspecified atrial fibrillation: Secondary | ICD-10-CM | POA: Diagnosis present

## 2017-06-23 DIAGNOSIS — J449 Chronic obstructive pulmonary disease, unspecified: Secondary | ICD-10-CM | POA: Diagnosis present

## 2017-06-23 DIAGNOSIS — Z7901 Long term (current) use of anticoagulants: Secondary | ICD-10-CM

## 2017-06-23 DIAGNOSIS — I890 Lymphedema, not elsewhere classified: Secondary | ICD-10-CM | POA: Diagnosis not present

## 2017-06-23 DIAGNOSIS — R4701 Aphasia: Secondary | ICD-10-CM | POA: Diagnosis present

## 2017-06-23 DIAGNOSIS — H8109 Meniere's disease, unspecified ear: Secondary | ICD-10-CM | POA: Diagnosis present

## 2017-06-23 DIAGNOSIS — Z8249 Family history of ischemic heart disease and other diseases of the circulatory system: Secondary | ICD-10-CM

## 2017-06-23 DIAGNOSIS — E1159 Type 2 diabetes mellitus with other circulatory complications: Secondary | ICD-10-CM | POA: Diagnosis not present

## 2017-06-23 DIAGNOSIS — M542 Cervicalgia: Secondary | ICD-10-CM | POA: Diagnosis not present

## 2017-06-23 DIAGNOSIS — Z882 Allergy status to sulfonamides status: Secondary | ICD-10-CM

## 2017-06-23 DIAGNOSIS — I10 Essential (primary) hypertension: Secondary | ICD-10-CM | POA: Diagnosis present

## 2017-06-23 DIAGNOSIS — I639 Cerebral infarction, unspecified: Secondary | ICD-10-CM | POA: Diagnosis present

## 2017-06-23 DIAGNOSIS — I480 Paroxysmal atrial fibrillation: Secondary | ICD-10-CM | POA: Diagnosis present

## 2017-06-23 DIAGNOSIS — Z881 Allergy status to other antibiotic agents status: Secondary | ICD-10-CM

## 2017-06-23 DIAGNOSIS — Z8673 Personal history of transient ischemic attack (TIA), and cerebral infarction without residual deficits: Secondary | ICD-10-CM

## 2017-06-23 DIAGNOSIS — E669 Obesity, unspecified: Secondary | ICD-10-CM | POA: Diagnosis present

## 2017-06-23 DIAGNOSIS — I252 Old myocardial infarction: Secondary | ICD-10-CM

## 2017-06-23 DIAGNOSIS — G250 Essential tremor: Secondary | ICD-10-CM | POA: Diagnosis present

## 2017-06-23 DIAGNOSIS — E785 Hyperlipidemia, unspecified: Secondary | ICD-10-CM | POA: Diagnosis present

## 2017-06-23 DIAGNOSIS — G459 Transient cerebral ischemic attack, unspecified: Secondary | ICD-10-CM | POA: Diagnosis not present

## 2017-06-23 DIAGNOSIS — R29700 NIHSS score 0: Secondary | ICD-10-CM | POA: Diagnosis present

## 2017-06-23 DIAGNOSIS — E1151 Type 2 diabetes mellitus with diabetic peripheral angiopathy without gangrene: Secondary | ICD-10-CM | POA: Diagnosis not present

## 2017-06-23 DIAGNOSIS — Z91048 Other nonmedicinal substance allergy status: Secondary | ICD-10-CM

## 2017-06-23 DIAGNOSIS — I2510 Atherosclerotic heart disease of native coronary artery without angina pectoris: Secondary | ICD-10-CM | POA: Diagnosis present

## 2017-06-23 DIAGNOSIS — Z955 Presence of coronary angioplasty implant and graft: Secondary | ICD-10-CM | POA: Diagnosis not present

## 2017-06-23 DIAGNOSIS — Z888 Allergy status to other drugs, medicaments and biological substances status: Secondary | ICD-10-CM

## 2017-06-23 DIAGNOSIS — Z79899 Other long term (current) drug therapy: Secondary | ICD-10-CM

## 2017-06-23 DIAGNOSIS — E119 Type 2 diabetes mellitus without complications: Secondary | ICD-10-CM

## 2017-06-23 DIAGNOSIS — Z85828 Personal history of other malignant neoplasm of skin: Secondary | ICD-10-CM

## 2017-06-23 LAB — COMPREHENSIVE METABOLIC PANEL
ALT: 24 U/L (ref 14–54)
AST: 19 U/L (ref 15–41)
Albumin: 3.7 g/dL (ref 3.5–5.0)
Alkaline Phosphatase: 77 U/L (ref 38–126)
Anion gap: 11 (ref 5–15)
BUN: 13 mg/dL (ref 6–20)
CHLORIDE: 104 mmol/L (ref 101–111)
CO2: 26 mmol/L (ref 22–32)
Calcium: 9 mg/dL (ref 8.9–10.3)
Creatinine, Ser: 0.67 mg/dL (ref 0.44–1.00)
Glucose, Bld: 209 mg/dL — ABNORMAL HIGH (ref 65–99)
POTASSIUM: 3.4 mmol/L — AB (ref 3.5–5.1)
Sodium: 141 mmol/L (ref 135–145)
Total Bilirubin: 0.8 mg/dL (ref 0.3–1.2)
Total Protein: 6.5 g/dL (ref 6.5–8.1)

## 2017-06-23 LAB — URINALYSIS, ROUTINE W REFLEX MICROSCOPIC
BILIRUBIN URINE: NEGATIVE
Bacteria, UA: NONE SEEN
Glucose, UA: >=500 mg/dL — AB
HGB URINE DIPSTICK: NEGATIVE
KETONES UR: NEGATIVE mg/dL
LEUKOCYTES UA: NEGATIVE
NITRITE: NEGATIVE
Protein, ur: NEGATIVE mg/dL
SPECIFIC GRAVITY, URINE: 1.027 (ref 1.005–1.030)
pH: 9 — ABNORMAL HIGH (ref 5.0–8.0)

## 2017-06-23 LAB — I-STAT CHEM 8, ED
BUN: 14 mg/dL (ref 6–20)
CHLORIDE: 101 mmol/L (ref 101–111)
Calcium, Ion: 1.1 mmol/L — ABNORMAL LOW (ref 1.15–1.40)
Creatinine, Ser: 0.6 mg/dL (ref 0.44–1.00)
Glucose, Bld: 204 mg/dL — ABNORMAL HIGH (ref 65–99)
HEMATOCRIT: 47 % — AB (ref 36.0–46.0)
HEMOGLOBIN: 16 g/dL — AB (ref 12.0–15.0)
POTASSIUM: 3.4 mmol/L — AB (ref 3.5–5.1)
SODIUM: 142 mmol/L (ref 135–145)
TCO2: 29 mmol/L (ref 22–32)

## 2017-06-23 LAB — DIFFERENTIAL
BASOS ABS: 0 10*3/uL (ref 0.0–0.1)
BASOS PCT: 0 %
Eosinophils Absolute: 0.1 10*3/uL (ref 0.0–0.7)
Eosinophils Relative: 1 %
Lymphocytes Relative: 39 %
Lymphs Abs: 2.8 10*3/uL (ref 0.7–4.0)
MONO ABS: 0.6 10*3/uL (ref 0.1–1.0)
MONOS PCT: 9 %
NEUTROS ABS: 3.6 10*3/uL (ref 1.7–7.7)
Neutrophils Relative %: 51 %

## 2017-06-23 LAB — CBC
HEMATOCRIT: 46.7 % — AB (ref 36.0–46.0)
Hemoglobin: 16.3 g/dL — ABNORMAL HIGH (ref 12.0–15.0)
MCH: 32.4 pg (ref 26.0–34.0)
MCHC: 34.9 g/dL (ref 30.0–36.0)
MCV: 92.8 fL (ref 78.0–100.0)
PLATELETS: 171 10*3/uL (ref 150–400)
RBC: 5.03 MIL/uL (ref 3.87–5.11)
RDW: 12.8 % (ref 11.5–15.5)
WBC: 7.1 10*3/uL (ref 4.0–10.5)

## 2017-06-23 LAB — APTT: APTT: 33 s (ref 24–36)

## 2017-06-23 LAB — I-STAT TROPONIN, ED: TROPONIN I, POC: 0.01 ng/mL (ref 0.00–0.08)

## 2017-06-23 LAB — RAPID URINE DRUG SCREEN, HOSP PERFORMED
Amphetamines: NOT DETECTED
Barbiturates: NOT DETECTED
Benzodiazepines: NOT DETECTED
Cocaine: NOT DETECTED
OPIATES: NOT DETECTED
TETRAHYDROCANNABINOL: NOT DETECTED

## 2017-06-23 LAB — CBG MONITORING, ED: GLUCOSE-CAPILLARY: 210 mg/dL — AB (ref 65–99)

## 2017-06-23 LAB — PROTIME-INR
INR: 1.22
Prothrombin Time: 15.3 seconds — ABNORMAL HIGH (ref 11.4–15.2)

## 2017-06-23 MED ORDER — ASPIRIN 325 MG PO TABS
325.0000 mg | ORAL_TABLET | Freq: Every day | ORAL | Status: DC
  Administered 2017-06-23 – 2017-06-24 (×2): 325 mg via ORAL
  Filled 2017-06-23 (×2): qty 1

## 2017-06-23 MED ORDER — IOPAMIDOL (ISOVUE-370) INJECTION 76%
INTRAVENOUS | Status: AC
  Administered 2017-06-23: 100 mL via INTRAVENOUS
  Filled 2017-06-23: qty 100

## 2017-06-23 MED ORDER — DOFETILIDE 500 MCG PO CAPS
500.0000 ug | ORAL_CAPSULE | Freq: Two times a day (BID) | ORAL | Status: DC
  Administered 2017-06-24: 500 ug via ORAL
  Filled 2017-06-23: qty 1
  Filled 2017-06-23: qty 2
  Filled 2017-06-23: qty 1

## 2017-06-23 MED ORDER — DULOXETINE HCL 30 MG PO CPEP
30.0000 mg | ORAL_CAPSULE | ORAL | Status: DC
  Administered 2017-06-23: 30 mg via ORAL
  Filled 2017-06-23 (×2): qty 1

## 2017-06-23 MED ORDER — ACETAMINOPHEN 325 MG PO TABS: 650.0000 mg | ORAL_TABLET | Freq: Once | ORAL | Status: DC

## 2017-06-23 MED ORDER — ADULT MULTIVITAMIN W/MINERALS CH: 1.0000 | ORAL_TABLET | Freq: Every day | ORAL | Status: DC

## 2017-06-23 MED ORDER — ACETAMINOPHEN 650 MG RE SUPP: 650.0000 mg | RECTAL | Status: DC | PRN

## 2017-06-23 MED ORDER — ALBUTEROL SULFATE (2.5 MG/3ML) 0.083% IN NEBU: 2.5000 mg | INHALATION_SOLUTION | Freq: Four times a day (QID) | RESPIRATORY_TRACT | Status: DC | PRN

## 2017-06-23 MED ORDER — ACETAMINOPHEN 160 MG/5ML PO SOLN: 650.0000 mg | ORAL | Status: DC | PRN

## 2017-06-23 MED ORDER — INSULIN ASPART 100 UNIT/ML ~~LOC~~ SOLN
0.0000 [IU] | Freq: Three times a day (TID) | SUBCUTANEOUS | Status: DC
  Administered 2017-06-24: 2 [IU] via SUBCUTANEOUS
  Administered 2017-06-24: 5 [IU] via SUBCUTANEOUS

## 2017-06-23 MED ORDER — ASPIRIN 300 MG RE SUPP: 300.0000 mg | Freq: Every day | RECTAL | Status: DC

## 2017-06-23 MED ORDER — STROKE: EARLY STAGES OF RECOVERY BOOK
Freq: Once | Status: AC
  Administered 2017-06-24: 01:00:00
  Filled 2017-06-23: qty 1

## 2017-06-23 MED ORDER — ACETAMINOPHEN 325 MG PO TABS
650.0000 mg | ORAL_TABLET | ORAL | Status: DC | PRN
  Administered 2017-06-23: 650 mg via ORAL
  Filled 2017-06-23: qty 2

## 2017-06-23 MED ORDER — ROSUVASTATIN CALCIUM 5 MG PO TABS
5.0000 mg | ORAL_TABLET | Freq: Every day | ORAL | Status: DC
  Administered 2017-06-23: 5 mg via ORAL
  Filled 2017-06-23: qty 1

## 2017-06-23 MED ORDER — IBUPROFEN 200 MG PO TABS: 800.0000 mg | ORAL_TABLET | Freq: Three times a day (TID) | ORAL | Status: DC | PRN

## 2017-06-23 MED ORDER — GADOBENATE DIMEGLUMINE 529 MG/ML IV SOLN
15.0000 mL | Freq: Once | INTRAVENOUS | Status: AC | PRN
  Administered 2017-06-23: 15 mL via INTRAVENOUS

## 2017-06-23 MED ORDER — DIAZEPAM 2 MG PO TABS
2.0000 mg | ORAL_TABLET | Freq: Every day | ORAL | Status: DC | PRN
Start: 2017-06-23 — End: 2017-06-24

## 2017-06-23 NOTE — ED Notes (Signed)
Notified EDP,Tegeler,MD., pt. I-stat CG4 Lactic acid results 3.4 and RN,Tim made aware.

## 2017-06-23 NOTE — ED Provider Notes (Signed)
[facility] DEPT Provider Note   CSN: [id number] Arrival date & time: 06/23/17  1644     History   Chief Complaint Chief Complaint  Patient presents with  . Aphasia    HPI Melanie Lucas is a 67 y.o. female.  The history is provided by the patient, the spouse and medical records.  Neurologic Problem  This is a new problem. The current episode started 1 to 2 hours ago. The problem occurs constantly. The problem has been resolved. Associated symptoms include headaches. Pertinent negatives include no chest pain, no abdominal pain and no shortness of breath. Nothing aggravates the symptoms. Nothing relieves the symptoms. She has tried nothing for the symptoms. The treatment provided no relief.    Past Medical History:  Diagnosis Date  . Cancer (HCC)    Skin- leg   . Chronic acquired lymphedema    a. R>L;  b. 03/2012 Neg LE U/S for DVT. Legs since age 26  . Complication of anesthesia    woke up during colonoscopy, Lithrostrixpy, Biosp[facility]  . COPD (chronic obstructive pulmonary disease) ([place])   . Coronary artery disease   . CVD (cardiovascular disease)   . Diabetes mellitus    Type II  . Dysrhythmia    afib  . Elevated TSH    a. 10/2012 - inst to f/u PCP.  [name] Hematuria    a. while on pradaxa,  she reports that she has seen Dr Margie Ege and had low risk cystoscopy  . History of kidney stones   . Hyperlipidemia   . Hypertension    patient denies  . Lacunar infarction ([place])    a. 02/2009 non-acute Lacunar infarct of the right thalamus noted on MRI of brain.  [name] Lymphedema of extremity    had this problem, since she was a teenager. especially seen in R LE  . Meniere disease   . Myocardial infarction ([place])   . Obesity   . Osteoarthritis    cervical & lumbar region, knees, hands cramp also   . PAF with post-termination pauses    a. on dronedarone;  b. CHA2DS2VASc = 5 (HTN, DM, h/o lacunar infarct on MRI, Female) ->refused oral anticoagulation after h/o  hematuria on pradaxa;  c. 02/2012 Echo: EF 55-60%, mildly dil LA. D. Recurrent PAF 10/2012 after only taking Multaq 1x/day - spont conv to NSR, placed back on BID Multaq/eliquis;  e. 10/2014 Multaq d/c'd->tikosyn initiated.  . Shortness of breath dyspnea    with exertion  . Stroke [facility])    2 mini strokes  . Tobacco abuse     Patient Active Problem List   Diagnosis Date Noted  . Cervical spondylosis with myelopathy 02/21/2016  . Bilateral leg edema 11/24/2014  . Cervical stenosis of spine 11/24/2014  . Numbness of fingers of both hands 11/24/2014  . HLD (hyperlipidemia) 11/09/2014  . Diabetes mellitus ([place]) 11/09/2014  . Pulmonary hypertension ([place]) 08/07/2013  . CAD (coronary artery disease) 03/11/2013  . Peripheral edema 02/25/2012  . Essential hypertension, benign 05/06/2009  . Overweight 04/13/2009  . TOBACCO ABUSE 04/13/2009  . Atrial fibrillation with RVR ([place]) 04/13/2009  . CHEST PAIN, ATYPICAL 04/13/2009    Past Surgical History:  Procedure Laterality Date  . ANTERIOR CERVICAL CORPECTOMY N/A 02/21/2016   Procedure: ANTERIOR CERVICAL CORPECTOMY ANS FUSION CERVICAL SIX , ANTERIOR PLATING CERVICAL FIVE-SEVEN;  Surgeon: Consuella Lose, MD;  Location: [facility];  Service: Neurosurgery;  Laterality: N/A;  . ANTERIOR FUSION CERVICAL SPINE  02/21/2016  . Biopsy of  adrenal glands    . BREAST BIOPSY Bilateral    benign results, both breasts   . CARDIAC CATHETERIZATION  2014   x1 stent placed, done in [place]   . CARDIOVERSION N/A 11/11/2014   Procedure: CARDIOVERSION;  Surgeon: Larey Dresser, MD;  Location: [employer];  Service: Cardiovascular;  Laterality: N/A;  . COLONOSCOPY W/ POLYPECTOMY    . CORONARY STENT PLACEMENT    . TEE WITHOUT CARDIOVERSION N/A 11/11/2014   Procedure: TRANSESOPHAGEAL ECHOCARDIOGRAM (TEE);  Surgeon: Larey Dresser, MD;  Location: [facility];  Service: Cardiovascular;  Laterality: N/A;     OB History   None      Home Medications    Prior  to Admission medications   Medication Sig Start Date End Date Taking? Authorizing Provider  albuterol (PROVENTIL HFA;VENTOLIN HFA) 108 (90 Base) MCG/ACT inhaler Inhale 1-2 puffs into the lungs every 6 (six) hours as needed for wheezing or shortness of breath.    [provider]  diazepam (VALIUM) 2 MG tablet Take 2 mg by mouth daily as needed for anxiety.  09/02/12   [provider]  dofetilide (TIKOSYN) 500 MCG capsule Take 1 capsule (500 mcg total) 2 (two) times daily by mouth. 12/28/16   Allred, Jeneen Rinks, MD  DULoxetine (CYMBALTA) 30 MG capsule Take 1 capsule by mouth every other day.     [provider]  ELIQUIS 5 MG TABS tablet TAKE 1 TABLET (5 MG TOTAL) BY MOUTH 2 (TWO) TIMES DAILY. 03/14/17   Allred, Jeneen Rinks, MD  Empagliflozin-Metformin HCl ER (SYNJARDY XR) 11-998 MG TB24 Take 1 tablet by mouth 2 (two) times daily.     [provider]  furosemide (LASIX) 20 MG tablet Take 20 mg by mouth daily.     [provider]  ibuprofen (ADVIL,MOTRIN) 800 MG tablet Take 800 mg by mouth 3 (three) times daily as needed for mild pain.     [provider]  Multiple Vitamin (MULTIVITAMIN) tablet Take 1 tablet by mouth at bedtime.     [provider]  rosuvastatin (CRESTOR) 5 MG tablet Take 5 mg by mouth at bedtime.     [provider]  VALTREX 500 MG tablet Take 500 mg by mouth 2 (two) times daily as needed (for outbreaks).  02/19/13   [provider]  zoledronic acid (RECLAST) 5 MG/100ML SOLN injection Inject 5 mg into the vein every 6 (six) months.     [provider]    Family History Family History  Problem Relation Age of Onset  . Heart attack Father   . Coronary artery disease Father        strong family hx  . Bladder Cancer Mother        bladder  . Leukemia Brother   . Breast cancer Maternal Aunt   . Breast cancer Paternal Aunt   . Breast cancer Maternal Aunt     Social History Social History   Tobacco Use    . Smoking status: Current Every Day Smoker    Packs/day: 1.00    Years: 55.00    Pack years: 55.00    Types: Cigarettes  . Smokeless tobacco: Never Used  . Tobacco comment: not ready to quit  Substance Use Topics  . Alcohol use: Yes    Comment: 1 beer per month  . Drug use: No     Allergies   Avelox [moxifloxacin]; Chantix [varenicline]; Fosamax [alendronate]; Other; Statins; Sulfamethoxazole; Sulfonamide derivatives; Ciprofloxacin; Gabapentin; and Tape   Review of  Systems Review of Systems  Constitutional: Negative for chills, diaphoresis, fatigue and fever.  HENT: Negative for congestion.   Eyes: Negative for visual disturbance.  Respiratory: Negative for cough, chest tightness and shortness of breath.   Cardiovascular: Negative for chest pain.  Gastrointestinal: Negative for abdominal pain, constipation, diarrhea, nausea and vomiting.  Genitourinary: Negative for dysuria and flank pain.  Musculoskeletal: Negative for back pain, neck pain and neck stiffness.  Skin: Negative for rash and wound.  Neurological: Positive for speech difficulty and headaches. Negative for tremors, seizures, syncope, weakness, light-headedness and numbness.  Psychiatric/Behavioral: Negative for agitation and confusion.  All other systems reviewed and are negative.    Physical Exam Updated Vital Signs BP (!) 194/79 (BP Location: Left Arm)   Pulse 62   Temp 97.8 F (36.6 C) (Oral)   Resp 12   Ht 5\' 7"  (1.702 m)   Wt 74.4 kg (164 lb)   SpO2 100%   BMI 25.69 kg/m   Physical Exam  Constitutional: She is oriented to person, place, and time. She appears well-developed and well-nourished. No distress.  HENT:  Head: Normocephalic and atraumatic.  Nose: Nose normal.  Mouth/Throat: Oropharynx is clear and moist. No oropharyngeal exudate.  Eyes: Pupils are equal, round, and reactive to light. Conjunctivae and EOM are normal.  Neck: Normal range of motion. Neck supple.  Cardiovascular: Normal  rate and intact distal pulses.  No murmur heard. Pulmonary/Chest: Effort normal and breath sounds normal. She has no wheezes. She exhibits no tenderness.  Abdominal: Soft. Bowel sounds are normal. She exhibits no distension. There is no tenderness.  Musculoskeletal: Normal range of motion. She exhibits no edema or tenderness.  Neurological: She is alert and oriented to person, place, and time. She is not disoriented. She displays no tremor and normal reflexes. No cranial nerve deficit or sensory deficit. She exhibits normal muscle tone. Coordination normal. GCS eye subscore is 4. GCS verbal subscore is 5. GCS motor subscore is 6.  Gait initialy deferred  Patient had clear speech.  No facial droop and pupils are symmetric and reactive bilaterally.  Patient was alert and oriented.  No focal neurologic deficits seen.  Skin: Capillary refill takes less than 2 seconds. No rash noted. She is not diaphoretic. No erythema.  Psychiatric: She has a normal mood and affect.  Nursing note and vitals reviewed.    ED Treatments / Results  Labs (all labs ordered are listed, but only abnormal results are displayed) Labs Reviewed  PROTIME-INR - Abnormal; Notable for the following components:      Result Value   Prothrombin Time 15.3 (*)    All other components within normal limits  CBC - Abnormal; Notable for the following components:   Hemoglobin 16.3 (*)    HCT 46.7 (*)    All other components within normal limits  COMPREHENSIVE METABOLIC PANEL - Abnormal; Notable for the following components:   Potassium 3.4 (*)    Glucose, Bld 209 (*)    All other components within normal limits  URINALYSIS, ROUTINE W REFLEX MICROSCOPIC - Abnormal; Notable for the following components:   APPearance HAZY (*)    pH 9.0 (*)    Glucose, UA >=500 (*)    All other components within normal limits  CBG MONITORING, ED - Abnormal; Notable for the following components:   Glucose-Capillary 210 (*)    All other components  within normal limits  I-STAT CHEM 8, ED - Abnormal; Notable for the following components:   Potassium 3.4 (*)  Glucose, Bld 204 (*)    Calcium, Ion 1.10 (*)    Hemoglobin 16.0 (*)    HCT 47.0 (*)    All other components within normal limits  APTT  DIFFERENTIAL  RAPID URINE DRUG SCREEN, HOSP PERFORMED  HEMOGLOBIN A1C  LIPID PANEL  I-STAT TROPONIN, ED  CBG MONITORING, ED    EKG EKG Interpretation  Date/Time:  Sunday Jun 23 2017 16:55:07 EDT Ventricular Rate:  61 PR Interval:    QRS Duration: 113 QT Interval:  515 QTC Calculation: 493 R Axis:   -57 Text Interpretation:  Sinus rhythm Atrial premature complexes Left anterior fascicular block RSR' in V1 or V2, probably normal variant LVH with secondary repolarization abnormality Anterior Q waves, possibly due to LVH When compared to prior, no significant changes seen.  No STEMI Confirmed by Antony Blackbird [phone]) on 06/23/2017 5:18:16 PM   Radiology Ct Angio Head W Or Wo Contrast  Result Date: 06/23/2017 CLINICAL DATA:  Initial evaluation for acute speech difficulty. EXAM: CT ANGIOGRAPHY HEAD AND NECK TECHNIQUE: Multidetector CT imaging of the head and neck was performed using the standard protocol during bolus administration of intravenous contrast. Multiplanar CT image reconstructions and MIPs were obtained to evaluate the vascular anatomy. Carotid stenosis measurements (when applicable) are obtained utilizing NASCET criteria, using the distal internal carotid diameter as the denominator. CONTRAST:  <See Chart> ISOVUE-370 IOPAMIDOL (ISOVUE-370) INJECTION 76% COMPARISON:  Prior noncontrast head CT from earlier the same day. FINDINGS: CTA NECK FINDINGS Aortic arch: Visualized aortic arch of normal caliber with normal branch pattern. Mild scattered atheromatous plaque within the arch and about the origin of the great vessels without flow-limiting stenosis. Visualized subclavian arteries widely patent. Right carotid system: Right common  carotid artery widely patent to the bifurcation. A centric calcified plaque at the proximal right ICA with up to 60% stenosis by NASCET criteria. Right ICA mildly tortuous but otherwise patent to the skull base without hemodynamically significant stenosis, dissection, or occlusion. Left carotid system: Left common carotid artery patent to the bifurcation without flow-limiting stenosis. Eccentric calcified plaque about the proximal left ICA with associated stenosis of approximately 50% by NASCET criteria. Left ICA tortuous but otherwise widely patent to the skull base without stenosis, dissection, or occlusion. Vertebral arteries: Both of the vertebral arteries arise from the subclavian arteries. Vertebral arteries widely patent within the neck without stenosis, dissection, or occlusion. Skeleton: No acute osseous abnormality. No worrisome lytic or blastic osseous lesions. Patient is status post ACDF at C5 through C7 with C6 corpectomy. Patient is edentulous. Other neck: No acute soft tissue abnormality within the neck. Salivary glands within normal limits. Thyroid normal. No adenopathy. Upper chest: Visualized upper chest within normal limits. Visualized lungs are grossly clear. Review of the MIP images confirms the above findings CTA HEAD FINDINGS Anterior circulation: Mild scattered plaque within the petrous ICAs bilaterally without significant narrowing on the right. Associated mild stenosis on the left. Scattered atheromatous plaque within the cavernous/supraclinoid ICAs with mild multifocal narrowing. ICA termini widely patent. A1 segments somewhat irregular but patent without high-grade stenosis. Right A1 dominant. Patent and normal anterior communicating artery. Anterior cerebral arteries widely patent to their distal aspects without stenosis. M1 segments widely patent without stenosis. No proximal M2 occlusion. Distal MCA branches well perfused and symmetric. Posterior circulation: Vertebral arteries  codominant and patent to the vertebrobasilar junction without stenosis. Patent right PICA. Left PICA not visualized. Basilar widely patent to its distal aspect. Dominant left AICA. Superior cerebral arteries patent bilaterally. PCAs primarily  supplied via the basilar and are widely patent to their distal aspects. Small right posterior communicating artery. Venous sinuses: Grossly patent. Anatomic variants: None significant.  No aneurysm. Delayed phase: No abnormal enhancement. Review of the MIP images confirms the above findings IMPRESSION: 1. Negative CTA for emergent large vessel occlusion. 2. Atheromatous plaque about the carotid bifurcations with associated stenoses of up to 50-60% by NASCET criteria, right slightly worse than left. 3. Moderate atherosclerotic change throughout the carotid siphons without high-grade stenosis. No proximal high-grade or correctable stenosis identified. Electronically Signed   By: Jeannine Boga M.D.   On: 06/23/2017 19:02   Dg Chest 2 View  Result Date: 06/23/2017 CLINICAL DATA:  She has some remnant of verbal hesitancy/aphasia. She c/o left temporal area h/a and is in no distress. Her husband is with her. Pt is lethargic. H/o COPD, Diabetes, Dysrhythmia, Myocardial Infarction, Stroke. EXAM: CHEST - 2 VIEW COMPARISON:  Chest radiograph 11/05/2016 FINDINGS: Normal cardiac silhouette. No effusion, infiltrate pneumothorax. Anterior cervical fusion IMPRESSION: No acute cardiopulmonary process. Electronically Signed   By: Suzy Bouchard M.D.   On: 06/23/2017 21:15   Ct Angio Neck W And/or Wo Contrast  Result Date: 06/23/2017 CLINICAL DATA:  Initial evaluation for acute speech difficulty. EXAM: CT ANGIOGRAPHY HEAD AND NECK TECHNIQUE: Multidetector CT imaging of the head and neck was performed using the standard protocol during bolus administration of intravenous contrast. Multiplanar CT image reconstructions and MIPs were obtained to evaluate the vascular anatomy. Carotid  stenosis measurements (when applicable) are obtained utilizing NASCET criteria, using the distal internal carotid diameter as the denominator. CONTRAST:  <See Chart> ISOVUE-370 IOPAMIDOL (ISOVUE-370) INJECTION 76% COMPARISON:  Prior noncontrast head CT from earlier the same day. FINDINGS: CTA NECK FINDINGS Aortic arch: Visualized aortic arch of normal caliber with normal branch pattern. Mild scattered atheromatous plaque within the arch and about the origin of the great vessels without flow-limiting stenosis. Visualized subclavian arteries widely patent. Right carotid system: Right common carotid artery widely patent to the bifurcation. A centric calcified plaque at the proximal right ICA with up to 60% stenosis by NASCET criteria. Right ICA mildly tortuous but otherwise patent to the skull base without hemodynamically significant stenosis, dissection, or occlusion. Left carotid system: Left common carotid artery patent to the bifurcation without flow-limiting stenosis. Eccentric calcified plaque about the proximal left ICA with associated stenosis of approximately 50% by NASCET criteria. Left ICA tortuous but otherwise widely patent to the skull base without stenosis, dissection, or occlusion. Vertebral arteries: Both of the vertebral arteries arise from the subclavian arteries. Vertebral arteries widely patent within the neck without stenosis, dissection, or occlusion. Skeleton: No acute osseous abnormality. No worrisome lytic or blastic osseous lesions. Patient is status post ACDF at C5 through C7 with C6 corpectomy. Patient is edentulous. Other neck: No acute soft tissue abnormality within the neck. Salivary glands within normal limits. Thyroid normal. No adenopathy. Upper chest: Visualized upper chest within normal limits. Visualized lungs are grossly clear. Review of the MIP images confirms the above findings CTA HEAD FINDINGS Anterior circulation: Mild scattered plaque within the petrous ICAs bilaterally  without significant narrowing on the right. Associated mild stenosis on the left. Scattered atheromatous plaque within the cavernous/supraclinoid ICAs with mild multifocal narrowing. ICA termini widely patent. A1 segments somewhat irregular but patent without high-grade stenosis. Right A1 dominant. Patent and normal anterior communicating artery. Anterior cerebral arteries widely patent to their distal aspects without stenosis. M1 segments widely patent without stenosis. No proximal M2 occlusion. Distal MCA  branches well perfused and symmetric. Posterior circulation: Vertebral arteries codominant and patent to the vertebrobasilar junction without stenosis. Patent right PICA. Left PICA not visualized. Basilar widely patent to its distal aspect. Dominant left AICA. Superior cerebral arteries patent bilaterally. PCAs primarily supplied via the basilar and are widely patent to their distal aspects. Small right posterior communicating artery. Venous sinuses: Grossly patent. Anatomic variants: None significant.  No aneurysm. Delayed phase: No abnormal enhancement. Review of the MIP images confirms the above findings IMPRESSION: 1. Negative CTA for emergent large vessel occlusion. 2. Atheromatous plaque about the carotid bifurcations with associated stenoses of up to 50-60% by NASCET criteria, right slightly worse than left. 3. Moderate atherosclerotic change throughout the carotid siphons without high-grade stenosis. No proximal high-grade or correctable stenosis identified. Electronically Signed   By: Jeannine Boga M.D.   On: 06/23/2017 19:02   Ct Head Code Stroke Wo Contrast  Result Date: 06/23/2017 CLINICAL DATA:  Code stroke. Initial evaluation for acute speech difficulty. EXAM: CT HEAD WITHOUT CONTRAST TECHNIQUE: Contiguous axial images were obtained from the base of the skull through the vertex without intravenous contrast. COMPARISON:  Prior MRI from 10/12/2014. FINDINGS: Brain: Age-related cerebral  atrophy. Patchy and confluent hypodensity within the periventricular and deep white matter both cerebral hemispheres, most consistent with chronic small vessel ischemic changes. Small remote lacunar infarct noted within the right thalamus. No acute intracranial hemorrhage. No acute large vessel territory infarct. Vague hypodensity at the anterior left temporal lobe felt to be most consistent with artifact. No mass lesion, midline shift or mass effect. No hydrocephalus. No extra-axial fluid collection. Vascular: No asymmetric hyperdense vessel. Scattered vascular calcifications noted within the carotid siphons. Skull: Scalp soft tissues and calvarium within normal limits. Sinuses/Orbits: Globes and orbital soft tissues within normal limits. Paranasal sinuses and mastoid air cells are clear. Other: None. ASPECTS [facility] Stroke Program Early CT Score) - Ganglionic level infarction (caudate, lentiform nuclei, internal capsule, insula, M1-M3 cortex): 7 - Supraganglionic infarction (M4-M6 cortex): 3 Total score (0-10 with 10 being normal): 10 IMPRESSION: 1. No acute intracranial infarct or other process identified. 2. ASPECTS is 10. 3. Age-related cerebral atrophy with chronic small vessel ischemic disease. Critical Value/emergent results were called by telephone at the time of interpretation on 06/23/2017 at 5:31 pm to Dr. Marda Stalker , who verbally acknowledged these results. Electronically Signed   By: Jeannine Boga M.D.   On: 06/23/2017 17:33    Procedures Procedures (including critical care time)  Medications Ordered in ED Medications  dofetilide (TIKOSYN) capsule 500 mcg (has no administration in time range)  diazepam (VALIUM) tablet 2 mg (has no administration in time range)  albuterol (PROVENTIL) (2.5 MG/3ML) 0.083% nebulizer solution 2.5 mg (has no administration in time range)  DULoxetine (CYMBALTA) DR capsule 30 mg (30 mg Oral Given 06/23/17 2042)  rosuvastatin (CRESTOR) tablet 5 mg (has  no administration in time range)  ibuprofen (ADVIL,MOTRIN) tablet 800 mg (has no administration in time range)  multivitamin with minerals tablet 1 tablet (has no administration in time range)   stroke: mapping our early stages of recovery book (has no administration in time range)  acetaminophen (TYLENOL) tablet 650 mg (650 mg Oral Given 06/23/17 2042)    Or  acetaminophen (TYLENOL) solution 650 mg ( Per Tube See Alternative 06/23/17 2042)    Or  acetaminophen (TYLENOL) suppository 650 mg ( Rectal See Alternative 06/23/17 2042)  aspirin suppository 300 mg ( Rectal See Alternative 06/23/17 2042)    Or  aspirin tablet  325 mg (325 mg Oral Given 06/23/17 2042)  insulin aspart (novoLOG) injection 0-15 Units (has no administration in time range)  iopamidol (ISOVUE-370) 76 % injection (100 mLs Intravenous Contrast Given 06/23/17 1806)     Initial Impression / Assessment and Plan / ED Course  I have reviewed the triage vital signs and the nursing notes.  Pertinent labs & imaging results that were available during my care of the patient were reviewed by me and considered in my medical decision making (see chart for details).     Melanie Lucas is a 67 y.o. female with a past medical history significant for prior TIA/stroke, hypertension, diabetes, CAD with MI and PCI, atrial fibrillation on Eliquis therapy, COPD, chronic lymphedema of the legs, and kidney stones who presents with expressive aphasia.  Patient is brought in by her husband who reports that her last normal was at 3 PM.  They report the patient was having a heated conversation about a very stressful topic she started having inability to speak.  She reports that she could not speak for nearly 2 hours and had a difficult time getting out her words.  Patient was seen in triage, there was concern for acute stroke.  By the time patient was taken back to an exam room and I went to examine the patient, patient was speaking much more clearly.  Patient reports  her symptoms are resolving.  She still has a mild to moderate headache but denies any other neurologic deficits.  She denies any vision changes, nausea, vomiting, numbness, tingling, or weakness of extremities.  She denies any facial droop.  She has no other complaints or any headache, speech difficulty.  She reports that she had some speech involvement with her prior stroke.  On arrival, patient blood pressure was over 696 systolic.  Patient's heart rate was in the 50s and low 60s.  Patient had code stroke head CT performed however since the patient's symptoms have nearly resolved and are improving, code stroke will be canceled.  Radiology called to report they saw small vessel disease but no acute bleed or stroke on CT.  Neurology recommended obtaining CTA of the head and neck to rule out a critical vessel stenosis prior to dropping the patient's blood pressure and treating this as it was a hypertensive emergency versus TIA.  CTA is ordered.  Anticipate given the patient blood pressure medicine after imaging is completed.  Patient will likely need admission for TIA versus hypertensive emergency causing her transient episode of expressive aphasia.  7:29 PM Patient's blood pressure was now 170/78.  This is improved from prior.  However when I reassessed patient just now, patient is having expressive aphasia again.  She is worse than when I initially saw her.  She is able to get out several words before she gets tripped up.  She is not slurring.  She has no other neurologic complaints at this time.  CTA was performed showing no evidence of large vessel occlusion.  There was some atheromatous plaques at the carotid bifurcations right worse than left.  No high-grade stenosis seen.  Neurology will be called back however I suspect patient will need to admitted for TIA versus stroke with waxing and waning symptoms.  7:40 PM I spoke with neurology at [facility] and they feel patient is appropriate for  admission at [facility] for MRI.  As patient is on Eliquis, she would not be a TPA candidate at this time.    Hospitalist will be called  for admission and transfer to [facility].  10:51 PM Just prior to admission and transfer to [facility] patient was reassessed and had improved speech.  Patient transferred in stable condition for admission to [facility].   Final Clinical Impressions(s) / ED Diagnoses   Final diagnoses:  Aphasia    ED Discharge Orders    None      Clinical Impression: 1. Aphasia   2. Acute ischemic stroke [facility])     Disposition: Admit  This note was prepared with assistance of Dragon voice recognition software. Occasional wrong-word or sound-a-like substitutions may have occurred due to the inherent limitations of voice recognition software.     Germani Gavilanes, Gwenyth Allegra, MD 06/23/17 2252

## 2017-06-23 NOTE — ED Notes (Signed)
She has some remnant of verbal hesitancy/aphasia. She c/o left temporal area h/a and is in no distress. Her husband is with her.

## 2017-06-23 NOTE — Consult Note (Signed)
Neurology [facility]  Reason for Consult: Aphasia Referring Physician: Dr. Sherry Ruffing, Dr. Alcario Drought  CC: Aphasia  History is obtained from: Chart, patient  HPI: Melanie Lucas is a 66 y.o. female who has a past medical history of lymphedema, COPD, coronary artery disease, diabetes, A. fib on anticoagulation, strokes in the past with no residual deficits, myocardial infarction, tobacco abuse, who came to the emergency room at [facility] this afternoon for evaluation of speech problems. She was in her usual state of health till about 3:30 PM when she had sudden onset of difficulty with word finding and slurred speech.  She said that she felt that she knew what she wanted to say but could not get her words out.  When she got some of her words out, she was very slurred.  [place] herself back home and spoke to couple of friends were EMTs and nurses who said that she should be going to the emergency room for an emergent evaluation for an acute stroke.  Upon initial assessment in the emergency room at [facility], her symptoms had resolved.  She had no focal numbness or weakness.  She had no cranial nerve signs.  Her speech was normal.  While in the emergency room at [facility], she then again had difficulty with her speech-described as inability to form sentences, speaking short 2 word phrases, and some difficulty following commands as well.  The symptoms also completely resolved.  She was taken in for a noncontrast CT of the head that was unremarkable.  CT angiogram head and neck was done that showed atheromatous plaques in bilateral carotid bifurcations right worse than left but no high-grade stenosis. Due to the multiplicity of events and prior history of stroke, she was admitted for further work-up.  Of note, on arrival her systolic blood pressures were over 200s.  Her pressures came down spontaneously to 170s without treatment when her symptoms had  recurred.  On-call neurology was called at some point per ER notes and further imaging was recommended to ensure there is no critical stenosis which might be causing recurrence of symptoms as her blood pressures dropped.  Her blood pressure was never in the normal range and she has been hypertensive consistently.  She is a patient of Dr. Wells Guiles Tat as an outpatient, who she was seen for tremors and sensory changes and was diagnosed with essential tremors and peripheral neuropathy.  Review of systems was positive for the neurological symptoms as above, in addition she also complained of headache, neck pain that is chronic.  Complains of tremor for which she seen Dr. Carles Collet.  LKW: 3:30 PM on 06/23/2017 tpa given?: no, NIH 0.  History of stuttering symptoms. Premorbid modified Rankin scale (mRS): 0  ROS: ROS was performed and is negative except as noted in the HPI.   Past Medical History:  Diagnosis Date  . Cancer (HCC)    Skin- leg   . Chronic acquired lymphedema    a. R>L;  b. 03/2012 Neg LE U/S for DVT. Legs since age 42  . Complication of anesthesia    woke up during colonoscopy, Lithrostrixpy, Biospy  . COPD (chronic obstructive pulmonary disease) ([place])   . Coronary artery disease   . CVD (cardiovascular disease)   . Diabetes mellitus    Type II  . Dysrhythmia    afib  . Elevated TSH    a. 10/2012 - inst to f/u PCP.  [name] Hematuria  a. while on pradaxa,  she reports that she has seen Dr Margie Ege and had low risk cystoscopy  . History of kidney stones   . Hyperlipidemia   . Hypertension    patient denies  . Lacunar infarction ([place])    a. 02/2009 non-acute Lacunar infarct of the right thalamus noted on MRI of brain.  [name] Lymphedema of extremity    had this problem, since she was a teenager. especially seen in R LE  . Meniere disease   . Myocardial infarction ([place])   . Obesity   . Osteoarthritis    cervical & lumbar region, knees, hands cramp also   . PAF with post-termination pauses     a. on dronedarone;  b. CHA2DS2VASc = 5 (HTN, DM, h/o lacunar infarct on MRI, Female) ->refused oral anticoagulation after h/o hematuria on pradaxa;  c. 02/2012 Echo: EF 55-60%, mildly dil LA. D. Recurrent PAF 10/2012 after only taking Multaq 1x/day - spont conv to NSR, placed back on BID Multaq/eliquis;  e. 10/2014 Multaq d/c'd->tikosyn initiated.  . Shortness of breath dyspnea    with exertion  . Stroke [facility])    2 mini strokes  . Tobacco abuse     Family History  Problem Relation Age of Onset  . Heart attack Father   . Coronary artery disease Father        strong family hx  . Bladder Cancer Mother        bladder  . Leukemia Brother   . Breast cancer Maternal Aunt   . Breast cancer Paternal Aunt   . Breast cancer Maternal Aunt    Social History:   reports that she has been smoking cigarettes.  She has a 55.00 pack-year smoking history. She has never used smokeless tobacco. She reports that she drinks alcohol. She reports that she does not use drugs.  Medications  Current Facility-Administered Medications:  .   stroke: mapping our early stages of recovery book, , Does not apply, Once, Alcario Drought, Jared M, DO .  acetaminophen (TYLENOL) tablet 650 mg, 650 mg, Oral, Q4H PRN, 650 mg at 06/23/17 2042 **OR** acetaminophen (TYLENOL) solution 650 mg, 650 mg, Per Tube, Q4H PRN **OR** acetaminophen (TYLENOL) suppository 650 mg, 650 mg, Rectal, Q4H PRN, Alcario Drought, Jared M, DO .  albuterol (PROVENTIL) (2.5 MG/3ML) 0.083% nebulizer solution 2.5 mg, 2.5 mg, Inhalation, Q6H PRN, Etta Quill, DO .  aspirin suppository 300 mg, 300 mg, Rectal, Daily **OR** aspirin tablet 325 mg, 325 mg, Oral, Daily, Jennette Kettle M, DO, 325 mg at 06/23/17 2042 .  diazepam (VALIUM) tablet 2 mg, 2 mg, Oral, Daily PRN, Alcario Drought, Jared M, DO .  dofetilide (TIKOSYN) capsule 500 mcg, 500 mcg, Oral, BID, Etta Quill, DO .  DULoxetine (CYMBALTA) DR capsule 30 mg, 30 mg, Oral, QODAY, Etta Quill, DO, 30 mg at 06/23/17  2042 .  ibuprofen (ADVIL,MOTRIN) tablet 800 mg, 800 mg, Oral, TID PRN, Etta Quill, DO .  [START ON 06/24/2017] insulin aspart (novoLOG) injection 0-15 Units, 0-15 Units, Subcutaneous, TID WC, Alcario Drought, Jared M, DO .  multivitamin with minerals tablet 1 tablet, 1 tablet, Oral, QHS, Alcario Drought, Jared M, DO .  rosuvastatin (CRESTOR) tablet 5 mg, 5 mg, Oral, QHS, Alcario Drought, Jared M, DO, 5 mg at 06/23/17 2241   Exam: Current vital signs: BP (!) 181/89   Pulse (!) 58   Temp 98 F (36.7 C)   Resp 16   Ht 5\' 7"  (1.702 m)   Wt 74.4 kg (164 lb)  SpO2 100%   BMI 25.69 kg/m  Vital signs in last 24 hours: Temp:  [97.8 F (36.6 C)-98 F (36.7 C)] 98 F (36.7 C) (05/05 2035) Pulse Rate:  [58-63] 58 (05/05 2238) Resp:  [12-16] 16 (05/05 2238) BP: (165-205)/(62-89) 181/89 (05/05 2238) SpO2:  [97 %-100 %] 100 % (05/05 2238) Weight:  [74.4 kg (164 lb)] 74.4 kg (164 lb) (05/05 1656) General: Patient is awake alert in no apparent distress but mildly anxious. HEENT: Normocephalic, atraumatic, dry mucous membranes, no lymphadenopathy or thyromegaly CVS: S1-S2 heard, regular rate rhythm, no murmur rub gallop Abdomen: Soft nondistended nontender Extremities: Warm well perfused with intact pulses, 1+ edema nonpitting on both legs worse on the right. Neurological exam   patient is awake alert oriented x3. Her speech is clear. Naming, comprehension and repetition intact. Cranial nerves: Pupils are equal round reactive to light, extraocular movements are intact, visual fields are full to confrontation and no extinction on double simultaneous stimulation, facial sensation intact, face is symmetric, auditory acuity mildly reduced bilaterally, palate elevates midline, shoulder shrug intact, tongue midline. Motor exam: Nearly symmetric 5/5 strength in all extremes with the exception of mild left upper extremity handgrip weakness.  This is chronic per the patient.  There is also fine tremor noted in both upper  extremities, worse on the left.  Unchanged from prior documented exam of Dr. Carles Collet on 06/03/2017 Sensory exam: Mildly decreased sensation in all modalities in a stocking type pattern. Coordination: Intact finger-nose-finger DTRs: 2+ all over with downgoing toes.  Labs I have reviewed labs in epic and the results pertinent to this consultation are:  CBC    Component Value Date/Time   WBC 7.1 06/23/2017 1714   RBC 5.03 06/23/2017 1714   HGB 16.0 (H) 06/23/2017 1716   HGB 14.8 11/26/2016 1550   HCT 47.0 (H) 06/23/2017 1716   HCT 43.5 11/26/2016 1550   PLT 171 06/23/2017 1714   PLT 180 11/26/2016 1550   MCV 92.8 06/23/2017 1714   MCV 91 11/26/2016 1550   MCH 32.4 06/23/2017 1714   MCHC 34.9 06/23/2017 1714   RDW 12.8 06/23/2017 1714   RDW 14.0 11/26/2016 1550   LYMPHSABS 2.8 06/23/2017 1714   LYMPHSABS 2.2 11/26/2016 1550   MONOABS 0.6 06/23/2017 1714   EOSABS 0.1 06/23/2017 1714   EOSABS 0.2 11/26/2016 1550   BASOSABS 0.0 06/23/2017 1714   BASOSABS 0.0 11/26/2016 1550    CMP     Component Value Date/Time   NA 142 06/23/2017 1716   NA 142 11/26/2016 1550   K 3.4 (L) 06/23/2017 1716   CL 101 06/23/2017 1716   CO2 26 06/23/2017 1714   GLUCOSE 204 (H) 06/23/2017 1716   BUN 14 06/23/2017 1716   BUN 15 11/26/2016 1550   CREATININE 0.60 06/23/2017 1716   CREATININE 0.77 10/17/2015 1824   CALCIUM 9.0 06/23/2017 1714   PROT 6.5 06/23/2017 1714   ALBUMIN 3.7 06/23/2017 1714   AST 19 06/23/2017 1714   ALT 24 06/23/2017 1714   ALKPHOS 77 06/23/2017 1714   BILITOT 0.8 06/23/2017 1714   GFRNONAA >60 06/23/2017 1714   GFRAA >60 06/23/2017 1714   Imaging I have reviewed the images obtained: CT-scan of the brain-no acute changes.  Chronic small vessel disease.  Small report lacunar infarct in the right thalamus.  No bleed. CT angios head and neck did not show any evidence of an emergent large vessel occlusion.  There is atheromatous plaque around the carotid bifurcations 50 to  60% bilaterally.  Moderate atherosclerotic changes throughout the carotid siphons without high-grade stenosis.  Assessment:  67 year old woman with past history documented above presenting for evaluation of word finding difficulty and slurred speech without any focal motor or sensory or cranial nerve deficits. Symptoms completely resolved and then recurred followed by spontaneous resolution again. Description of symptoms concerning for left cerebral hemisphere TIAs versus stroke. She is on Eliquis and not a candidate for TPA.  CT angios head and neck did not reveal any emergent LVO, hence not a candidate for EVT. Will recommend stroke work-up as below  Impression: Left hemispheric stroke versus TIA- likely cardio embolic versus atheroembolic in etiology Evaluate for small vessel disease etiology stroke given history of tobacco abuse and hypertension  Recommendations: -Admit to hospitalist -Telemetry -2D echo -MRI brain without contrast -Continue home statin.  Allergic to atorvastatin. -Hold Eliquis for now until an MRI is done. -Aspirin 325 p.o. for now -Hemoglobin A1c -Fasting lipid panel -Physical therapy, speech therapy, occupational therapy evaluations -N.p.o. until cleared by bedside swallow -I discussed the importance of exercise as well as smoking cessation in detail with the patient.  She informed me that she has attempted smoking cessation and had adverse reaction with Chantix.  I have recommended that she speak again with her primary care physician regarding smoking cessation as that is 1 of the most important modifiable risk factors for stroke.  I have discussed the plan in detail with the patient.  I have answered all her questions at the bedside.  The plan has been relayed to the patient's nurse as well as to Dr. Alcario Drought via phone.  Please page the stroke team NP/PA/MD as listed on AMION from 8 AM to 4 PM as this patient will be followed by the stroke team at this  point. For after hours, please page the on-call neurohospitalist as listed on AMION.  -- Amie Portland, MD [facility] Pager: [phone] If 7pm to 7am, please call on call as listed on AMION.

## 2017-06-23 NOTE — ED Notes (Signed)
Placed purewick on pt. 

## 2017-06-23 NOTE — ED Notes (Signed)
Patient transported to X-ray 

## 2017-06-23 NOTE — H&P (Signed)
History and Physical    Melanie Lucas IAX:[id number] DOB: [birth year] DOA: 06/23/2017  PCP: Dineen Kid, MD  Patient coming from: Home  I have personally briefly reviewed patient's old medical records in [place]  Chief Complaint: Aphasia  HPI: Melanie Lucas is a 67 y.o. female with medical history significant of A.Fib on eliquis, CAD, CVD, DM2, TIA x2, prior lacunar infarct.  Patient presents to the ED with c/o expressive aphasia.  LKW 3pm, started having inability to speak.  Ongoing for nearly 2 hours.  Seen in triage and concern for acute stroke.  Code stroke canceled as she is on eliquis and not a candidate for TPA.   ED Course: Speaking more clearly initially in ED when seen by EDP.  SBPs 200s.  Later on symptoms seemed to reoccur some, SBPs were 170s at that time, no BP treatment performed.  Symptoms seem to be improving again now (waxing and waning symptoms).   Review of Systems: As per HPI otherwise 10 point review of systems negative.   Past Medical History:  Diagnosis Date  . Cancer (HCC)    Skin- leg   . Chronic acquired lymphedema    a. R>L;  b. 03/2012 Neg LE U/S for DVT. Legs since age 73  . Complication of anesthesia    woke up during colonoscopy, Lithrostrixpy, Biospy  . COPD (chronic obstructive pulmonary disease) ([place])   . Coronary artery disease   . CVD (cardiovascular disease)   . Diabetes mellitus    Type II  . Dysrhythmia    afib  . Elevated TSH    a. 10/2012 - inst to f/u PCP.  [name] Hematuria    a. while on pradaxa,  she reports that she has seen Dr Margie Ege and had low risk cystoscopy  . History of kidney stones   . Hyperlipidemia   . Hypertension    patient denies  . Lacunar infarction ([place])    a. 02/2009 non-acute Lacunar infarct of the right thalamus noted on MRI of brain.  [name] Lymphedema of extremity    had this problem, since she was a teenager. especially seen in [place]  . Meniere disease   . Myocardial infarction ([place])   . Obesity   .  Osteoarthritis    cervical & lumbar region, knees, hands cramp also   . PAF with post-termination pauses    a. on dronedarone;  b. CHA2DS2VASc = 5 (HTN, DM, h/o lacunar infarct on MRI, Female) ->refused oral anticoagulation after h/o hematuria on pradaxa;  c. 02/2012 Echo: EF 55-60%, mildly dil LA. D. Recurrent PAF 10/2012 after only taking Multaq 1x/day - spont conv to NSR, placed back on BID Multaq/eliquis;  e. 10/2014 Multaq d/c'd->tikosyn initiated.  . Shortness of breath dyspnea    with exertion  . Stroke [facility])    2 mini strokes  . Tobacco abuse     Past Surgical History:  Procedure Laterality Date  . ANTERIOR CERVICAL CORPECTOMY N/A 02/21/2016   Procedure: ANTERIOR CERVICAL CORPECTOMY ANS FUSION CERVICAL SIX , ANTERIOR PLATING CERVICAL FIVE-SEVEN;  Surgeon: Consuella Lose, MD;  Location: [place];  Service: Neurosurgery;  Laterality: N/A;  . ANTERIOR FUSION CERVICAL SPINE  02/21/2016  . Biopsy of adrenal glands    . BREAST BIOPSY Bilateral    benign results, both breasts   . CARDIAC CATHETERIZATION  2014   x1 stent placed, done in [place]   . CARDIOVERSION N/A 11/11/2014   Procedure: CARDIOVERSION;  Surgeon: Larey Dresser, MD;  Location: [facility] ENDOSCOPY;  Service: Cardiovascular;  Laterality: N/A;  . COLONOSCOPY W/ POLYPECTOMY    . CORONARY STENT PLACEMENT    . TEE WITHOUT CARDIOVERSION N/A 11/11/2014   Procedure: TRANSESOPHAGEAL ECHOCARDIOGRAM (TEE);  Surgeon: Larey Dresser, MD;  Location: [facility] ENDOSCOPY;  Service: Cardiovascular;  Laterality: N/A;     reports that she has been smoking cigarettes.  She has a 55.00 pack-year smoking history. She has never used smokeless tobacco. She reports that she drinks alcohol. She reports that she does not use drugs.  Allergies  Allergen Reactions  . Avelox [Moxifloxacin]     unknown  . Chantix [Varenicline]     nightmares  . Fosamax [Alendronate]     Pain all over  . Other     Blood thinners-extreme bleeding  . Statins Other (See  Comments) and Nausea And Vomiting    Muscle pain Other reaction(s): Dizziness (intolerance)  . Sulfamethoxazole Nausea And Vomiting    Other reaction(s): Dizziness (intolerance)  . Sulfonamide Derivatives Hives    Nausea vertigo  . Ciprofloxacin Rash  . Gabapentin Rash    burning  . Tape Itching and Rash    Please use "paper" tape only    Family History  Problem Relation Age of Onset  . Heart attack Father   . Coronary artery disease Father        strong family hx  . Bladder Cancer Mother        bladder  . Leukemia Brother   . Breast cancer Maternal Aunt   . Breast cancer Paternal Aunt   . Breast cancer Maternal Aunt      Prior to Admission medications   Medication Sig Start Date End Date Taking? Authorizing Provider  albuterol (PROVENTIL HFA;VENTOLIN HFA) 108 (90 Base) MCG/ACT inhaler Inhale 1-2 puffs into the lungs every 6 (six) hours as needed for wheezing or shortness of breath.   Yes [provider]  diazepam (VALIUM) 2 MG tablet Take 2 mg by mouth daily as needed for anxiety.  09/02/12  Yes [provider]  dofetilide (TIKOSYN) 500 MCG capsule Take 1 capsule (500 mcg total) 2 (two) times daily by mouth. 12/28/16  Yes Allred, Jeneen Rinks, MD  DULoxetine (CYMBALTA) 30 MG capsule Take 1 capsule by mouth every other day.    Yes [provider]  ELIQUIS 5 MG TABS tablet TAKE 1 TABLET (5 MG TOTAL) BY MOUTH 2 (TWO) TIMES DAILY. Patient taking differently: Take 5 mg by mouth daily.  03/14/17  Yes Allred, Jeneen Rinks, MD  Empagliflozin-Metformin HCl ER (SYNJARDY XR) 11-998 MG TB24 Take 1 tablet by mouth 2 (two) times daily.    Yes [provider]  furosemide (LASIX) 20 MG tablet Take 20 mg by mouth daily.    Yes [provider]  ibuprofen (ADVIL,MOTRIN) 800 MG tablet Take 800 mg by mouth 3 (three) times daily as needed for mild pain.    Yes [provider]  Multiple Vitamin (MULTIVITAMIN) tablet Take 1 tablet by mouth at bedtime.    Yes  [provider]  rosuvastatin (CRESTOR) 5 MG tablet Take 5 mg by mouth at bedtime.    Yes [provider]  VALTREX 500 MG tablet Take 500 mg by mouth 2 (two) times daily as needed (for outbreaks).  02/19/13  Yes [provider]  zoledronic acid (RECLAST) 5 MG/100ML SOLN injection Inject 5 mg into the vein every 6 (six) months.    Yes [provider]    Physical Exam: Vitals:  06/23/17 1730 06/23/17 1922 06/23/17 2035 06/23/17 2037  BP: (!) 185/76 (!) 177/78  (!) 165/68  Pulse: (!) 58 60  63  Resp:  15  15  Temp:   98 F (36.7 C)   TempSrc:      SpO2: 99% 99%  99%  Weight:      Height:        Constitutional: NAD, calm, comfortable Eyes: PERRL, lids and conjunctivae normal ENMT: Mucous membranes are moist. Posterior pharynx clear of any exudate or lesions.Normal dentition.  Neck: normal, supple, no masses, no thyromegaly Respiratory: clear to auscultation bilaterally, no wheezing, no crackles. Normal respiratory effort. No accessory muscle use.  Cardiovascular: Regular rate and rhythm, no murmurs / rubs / gallops. No extremity edema. 2+ pedal pulses. No carotid bruits.  Abdomen: no tenderness, no masses palpated. No hepatosplenomegaly. Bowel sounds positive.  Musculoskeletal: no clubbing / cyanosis. No joint deformity upper and lower extremities. Good ROM, no contractures. Normal muscle tone.  Skin: no rashes, lesions, ulcers. No induration Neurologic: CN 2-12 grossly intact. Sensation intact, DTR normal. Strength 5/5 in all 4., no facial droop, tongue protrusion midline.  Difficulty with speech that is waxing and waning. Psychiatric: Normal judgment and insight. Alert and oriented x 3. Normal mood.    Labs on Admission: I have personally reviewed following labs and imaging studies  CBC: Recent Labs  Lab 06/23/17 1714 06/23/17 1716  WBC 7.1  --   NEUTROABS 3.6  --   HGB 16.3* 16.0*  HCT 46.7* 47.0*  MCV 92.8  --   PLT 171  --    Basic  Metabolic Panel: Recent Labs  Lab 06/23/17 1714 06/23/17 1716  NA 141 142  K 3.4* 3.4*  CL 104 101  CO2 26  --   GLUCOSE 209* 204*  BUN 13 14  CREATININE 0.67 0.60  CALCIUM 9.0  --    GFR: Estimated Creatinine Clearance: 70.9 mL/min (by C-G formula based on SCr of 0.6 mg/dL). Liver Function Tests: Recent Labs  Lab 06/23/17 1714  AST 19  ALT 24  ALKPHOS 77  BILITOT 0.8  PROT 6.5  ALBUMIN 3.7   No results for input(s): LIPASE, AMYLASE in the last 168 hours. No results for input(s): AMMONIA in the last 168 hours. Coagulation Profile: Recent Labs  Lab 06/23/17 1714  INR 1.22   Cardiac Enzymes: No results for input(s): CKTOTAL, CKMB, CKMBINDEX, TROPONINI in the last 168 hours. BNP (last 3 results) No results for input(s): PROBNP in the last 8760 hours. HbA1C: No results for input(s): HGBA1C in the last 72 hours. CBG: Recent Labs  Lab 06/23/17 1655  GLUCAP 210*   Lipid Profile: No results for input(s): CHOL, HDL, LDLCALC, TRIG, CHOLHDL, LDLDIRECT in the last 72 hours. Thyroid Function Tests: No results for input(s): TSH, T4TOTAL, FREET4, T3FREE, THYROIDAB in the last 72 hours. Anemia Panel: No results for input(s): VITAMINB12, FOLATE, FERRITIN, TIBC, IRON, RETICCTPCT in the last 72 hours. Urine analysis:    Component Value Date/Time   COLORURINE YELLOW 06/23/2017 2028   APPEARANCEUR HAZY (A) 06/23/2017 2028   LABSPEC 1.027 06/23/2017 2028   PHURINE 9.0 (H) 06/23/2017 2028   GLUCOSEU >=500 (A) 06/23/2017 2028   HGBUR NEGATIVE 06/23/2017 2028   BILIRUBINUR NEGATIVE 06/23/2017 2028   BILIRUBINUR negative 10/17/2015 1839   KETONESUR NEGATIVE 06/23/2017 2028   PROTEINUR NEGATIVE 06/23/2017 2028   UROBILINOGEN 0.2 10/17/2015 1839   UROBILINOGEN 1.0 09/20/2010 0025   NITRITE NEGATIVE 06/23/2017 2028   LEUKOCYTESUR NEGATIVE 06/23/2017  2028    Radiological Exams on Admission: Ct Angio Head W Or Wo Contrast  Result Date: 06/23/2017 CLINICAL DATA:  Initial  evaluation for acute speech difficulty. EXAM: CT ANGIOGRAPHY HEAD AND NECK TECHNIQUE: Multidetector CT imaging of the head and neck was performed using the standard protocol during bolus administration of intravenous contrast. Multiplanar CT image reconstructions and MIPs were obtained to evaluate the vascular anatomy. Carotid stenosis measurements (when applicable) are obtained utilizing NASCET criteria, using the distal internal carotid diameter as the denominator. CONTRAST:  <See Chart> ISOVUE-370 IOPAMIDOL (ISOVUE-370) INJECTION 76% COMPARISON:  Prior noncontrast head CT from earlier the same day. FINDINGS: CTA NECK FINDINGS Aortic arch: Visualized aortic arch of normal caliber with normal branch pattern. Mild scattered atheromatous plaque within the arch and about the origin of the great vessels without flow-limiting stenosis. Visualized subclavian arteries widely patent. Right carotid system: Right common carotid artery widely patent to the bifurcation. A centric calcified plaque at the proximal right ICA with up to 60% stenosis by NASCET criteria. Right ICA mildly tortuous but otherwise patent to the skull base without hemodynamically significant stenosis, dissection, or occlusion. Left carotid system: Left common carotid artery patent to the bifurcation without flow-limiting stenosis. Eccentric calcified plaque about the proximal left ICA with associated stenosis of approximately 50% by NASCET criteria. Left ICA tortuous but otherwise widely patent to the skull base without stenosis, dissection, or occlusion. Vertebral arteries: Both of the vertebral arteries arise from the subclavian arteries. Vertebral arteries widely patent within the neck without stenosis, dissection, or occlusion. Skeleton: No acute osseous abnormality. No worrisome lytic or blastic osseous lesions. Patient is status post ACDF at C5 through C7 with C6 corpectomy. Patient is edentulous. Other neck: No acute soft tissue abnormality within  the neck. Salivary glands within normal limits. Thyroid normal. No adenopathy. Upper chest: Visualized upper chest within normal limits. Visualized lungs are grossly clear. Review of the MIP images confirms the above findings CTA HEAD FINDINGS Anterior circulation: Mild scattered plaque within the petrous ICAs bilaterally without significant narrowing on the right. Associated mild stenosis on the left. Scattered atheromatous plaque within the cavernous/supraclinoid ICAs with mild multifocal narrowing. ICA termini widely patent. A1 segments somewhat irregular but patent without high-grade stenosis. Right A1 dominant. Patent and normal anterior communicating artery. Anterior cerebral arteries widely patent to their distal aspects without stenosis. M1 segments widely patent without stenosis. No proximal M2 occlusion. Distal MCA branches well perfused and symmetric. Posterior circulation: Vertebral arteries codominant and patent to the vertebrobasilar junction without stenosis. Patent right PICA. Left PICA not visualized. Basilar widely patent to its distal aspect. Dominant left AICA. Superior cerebral arteries patent bilaterally. PCAs primarily supplied via the basilar and are widely patent to their distal aspects. Small right posterior communicating artery. Venous sinuses: Grossly patent. Anatomic variants: None significant.  No aneurysm. Delayed phase: No abnormal enhancement. Review of the MIP images confirms the above findings IMPRESSION: 1. Negative CTA for emergent large vessel occlusion. 2. Atheromatous plaque about the carotid bifurcations with associated stenoses of up to 50-60% by NASCET criteria, right slightly worse than left. 3. Moderate atherosclerotic change throughout the carotid siphons without high-grade stenosis. No proximal high-grade or correctable stenosis identified. Electronically Signed   By: Jeannine Boga M.D.   On: 06/23/2017 19:02   Ct Angio Neck W And/or Wo Contrast  Result Date:  06/23/2017 CLINICAL DATA:  Initial evaluation for acute speech difficulty. EXAM: CT ANGIOGRAPHY HEAD AND NECK TECHNIQUE: Multidetector CT imaging of the head and neck  was performed using the standard protocol during bolus administration of intravenous contrast. Multiplanar CT image reconstructions and MIPs were obtained to evaluate the vascular anatomy. Carotid stenosis measurements (when applicable) are obtained utilizing NASCET criteria, using the distal internal carotid diameter as the denominator. CONTRAST:  <See Chart> ISOVUE-370 IOPAMIDOL (ISOVUE-370) INJECTION 76% COMPARISON:  Prior noncontrast head CT from earlier the same day. FINDINGS: CTA NECK FINDINGS Aortic arch: Visualized aortic arch of normal caliber with normal branch pattern. Mild scattered atheromatous plaque within the arch and about the origin of the great vessels without flow-limiting stenosis. Visualized subclavian arteries widely patent. Right carotid system: Right common carotid artery widely patent to the bifurcation. A centric calcified plaque at the proximal right ICA with up to 60% stenosis by NASCET criteria. Right ICA mildly tortuous but otherwise patent to the skull base without hemodynamically significant stenosis, dissection, or occlusion. Left carotid system: Left common carotid artery patent to the bifurcation without flow-limiting stenosis. Eccentric calcified plaque about the proximal left ICA with associated stenosis of approximately 50% by NASCET criteria. Left ICA tortuous but otherwise widely patent to the skull base without stenosis, dissection, or occlusion. Vertebral arteries: Both of the vertebral arteries arise from the subclavian arteries. Vertebral arteries widely patent within the neck without stenosis, dissection, or occlusion. Skeleton: No acute osseous abnormality. No worrisome lytic or blastic osseous lesions. Patient is status post ACDF at C5 through C7 with C6 corpectomy. Patient is edentulous. Other neck: No  acute soft tissue abnormality within the neck. Salivary glands within normal limits. Thyroid normal. No adenopathy. Upper chest: Visualized upper chest within normal limits. Visualized lungs are grossly clear. Review of the MIP images confirms the above findings CTA HEAD FINDINGS Anterior circulation: Mild scattered plaque within the petrous ICAs bilaterally without significant narrowing on the right. Associated mild stenosis on the left. Scattered atheromatous plaque within the cavernous/supraclinoid ICAs with mild multifocal narrowing. ICA termini widely patent. A1 segments somewhat irregular but patent without high-grade stenosis. Right A1 dominant. Patent and normal anterior communicating artery. Anterior cerebral arteries widely patent to their distal aspects without stenosis. M1 segments widely patent without stenosis. No proximal M2 occlusion. Distal MCA branches well perfused and symmetric. Posterior circulation: Vertebral arteries codominant and patent to the vertebrobasilar junction without stenosis. Patent right PICA. Left PICA not visualized. Basilar widely patent to its distal aspect. Dominant left AICA. Superior cerebral arteries patent bilaterally. PCAs primarily supplied via the basilar and are widely patent to their distal aspects. Small right posterior communicating artery. Venous sinuses: Grossly patent. Anatomic variants: None significant.  No aneurysm. Delayed phase: No abnormal enhancement. Review of the MIP images confirms the above findings IMPRESSION: 1. Negative CTA for emergent large vessel occlusion. 2. Atheromatous plaque about the carotid bifurcations with associated stenoses of up to 50-60% by NASCET criteria, right slightly worse than left. 3. Moderate atherosclerotic change throughout the carotid siphons without high-grade stenosis. No proximal high-grade or correctable stenosis identified. Electronically Signed   By: Jeannine Boga M.D.   On: 06/23/2017 19:02   Ct Head Code  Stroke Wo Contrast  Result Date: 06/23/2017 CLINICAL DATA:  Code stroke. Initial evaluation for acute speech difficulty. EXAM: CT HEAD WITHOUT CONTRAST TECHNIQUE: Contiguous axial images were obtained from the base of the skull through the vertex without intravenous contrast. COMPARISON:  Prior MRI from 10/12/2014. FINDINGS: Brain: Age-related cerebral atrophy. Patchy and confluent hypodensity within the periventricular and deep white matter both cerebral hemispheres, most consistent with chronic small vessel ischemic changes. Small remote  lacunar infarct noted within the right thalamus. No acute intracranial hemorrhage. No acute large vessel territory infarct. Vague hypodensity at the anterior left temporal lobe felt to be most consistent with artifact. No mass lesion, midline shift or mass effect. No hydrocephalus. No extra-axial fluid collection. Vascular: No asymmetric hyperdense vessel. Scattered vascular calcifications noted within the carotid siphons. Skull: Scalp soft tissues and calvarium within normal limits. Sinuses/Orbits: Globes and orbital soft tissues within normal limits. Paranasal sinuses and mastoid air cells are clear. Other: None. ASPECTS [facility] Stroke Program Early CT Score) - Ganglionic level infarction (caudate, lentiform nuclei, internal capsule, insula, M1-M3 cortex): 7 - Supraganglionic infarction (M4-M6 cortex): 3 Total score (0-10 with 10 being normal): 10 IMPRESSION: 1. No acute intracranial infarct or other process identified. 2. ASPECTS is 10. 3. Age-related cerebral atrophy with chronic small vessel ischemic disease. Critical Value/emergent results were called by telephone at the time of interpretation on 06/23/2017 at 5:31 pm to Dr. Marda Stalker , who verbally acknowledged these results. Electronically Signed   By: Jeannine Boga M.D.   On: 06/23/2017 17:33    EKG: Independently reviewed.  Assessment/Plan Principal Problem:   Acute ischemic stroke [facility]) Active  Problems:   Essential hypertension, benign   A-fib ([place])   Diabetes mellitus ([place])    1. Acute ischemic stroke - 1. Stroke pathway 2. Neuro consult 1. Hold eliquis 2. Permissive HTN 3. ASA 325 4. Not candidate for intervention due to being on eliquis 3. CTA shows no large vessel occlusion, does show 50-60% B carotid stenosis / atherosclerosis 4. 2d echo 5. Will hold off on ordering carotid dopplers since CTA neck performed 6. Tele monitor 7. PT/OT/SLP 2. HTN - hold BP meds allow permissive HTN 3. A.Fib - 1. Continue Tikosyn 4. DM2 - 1. Hold invokana-met 2. Mod scale SSI AC  DVT prophylaxis: SCDs, getting ASA, and had eliquis earlier today Code Status: Full Family Communication: Husband at bedside Disposition Plan: TBD Consults called: Spoke with Dr. Rory Percy Admission status: Admit to inpatient   [place], [facility] Pager [phone]  If 7AM-7PM, please contact day team taking care of patient [email or web address] Password [facility]  06/23/2017, 8:54 PM

## 2017-06-23 NOTE — ED Notes (Signed)
[facility] called for transport. 

## 2017-06-24 ENCOUNTER — Inpatient Hospital Stay (HOSPITAL_COMMUNITY): Payer: Medicare Other

## 2017-06-24 ENCOUNTER — Encounter (HOSPITAL_COMMUNITY): Payer: Self-pay | Admitting: *Deleted

## 2017-06-24 ENCOUNTER — Other Ambulatory Visit: Payer: Self-pay

## 2017-06-24 DIAGNOSIS — I361 Nonrheumatic tricuspid (valve) insufficiency: Secondary | ICD-10-CM

## 2017-06-24 DIAGNOSIS — G459 Transient cerebral ischemic attack, unspecified: Secondary | ICD-10-CM | POA: Diagnosis not present

## 2017-06-24 DIAGNOSIS — R4701 Aphasia: Secondary | ICD-10-CM | POA: Diagnosis not present

## 2017-06-24 DIAGNOSIS — I639 Cerebral infarction, unspecified: Secondary | ICD-10-CM | POA: Diagnosis not present

## 2017-06-24 LAB — BASIC METABOLIC PANEL
Anion gap: 10 (ref 5–15)
BUN: 11 mg/dL (ref 6–20)
CO2: 27 mmol/L (ref 22–32)
CREATININE: 0.75 mg/dL (ref 0.44–1.00)
Calcium: 8.8 mg/dL — ABNORMAL LOW (ref 8.9–10.3)
Chloride: 104 mmol/L (ref 101–111)
GFR calc Af Amer: >60 mL/min (ref >60)
GFR calc non Af Amer: >60 mL/min (ref >60)
Glucose, Bld: 235 mg/dL — ABNORMAL HIGH (ref 65–99)
Potassium: 3.5 mmol/L (ref 3.5–5.1)
Sodium: 141 mmol/L (ref 135–145)

## 2017-06-24 LAB — TSH: TSH: 3.231 u[IU]/mL (ref 0.350–4.500)

## 2017-06-24 LAB — LIPID PANEL
Cholesterol: 129 mg/dL (ref 0–200)
HDL: 51 mg/dL (ref >40)
LDL Cholesterol: 61 mg/dL (ref 0–99)
Total CHOL/HDL Ratio: 2.5 RATIO
Triglycerides: 87 mg/dL (ref <150)
VLDL: 17 mg/dL (ref 0–40)

## 2017-06-24 LAB — ECHOCARDIOGRAM COMPLETE
HEIGHTINCHES: 67 in
Weight: 2624 oz

## 2017-06-24 LAB — HEMOGLOBIN A1C
Hgb A1c MFr Bld: 8.5 % — ABNORMAL HIGH (ref 4.8–5.6)
Mean Plasma Glucose: 197.25 mg/dL

## 2017-06-24 LAB — MAGNESIUM: Magnesium: 2.1 mg/dL (ref 1.7–2.4)

## 2017-06-24 LAB — GLUCOSE, CAPILLARY
GLUCOSE-CAPILLARY: 125 mg/dL — AB (ref 65–99)
Glucose-Capillary: 241 mg/dL — ABNORMAL HIGH (ref 65–99)

## 2017-06-24 MED ORDER — NICOTINE 21 MG/24HR TD PT24: 21.0000 mg | MEDICATED_PATCH | Freq: Every day | TRANSDERMAL | 0 refills | Status: DC

## 2017-06-24 MED ORDER — APIXABAN 5 MG PO TABS
5.0000 mg | ORAL_TABLET | Freq: Two times a day (BID) | ORAL | Status: DC
  Administered 2017-06-24: 5 mg via ORAL

## 2017-06-24 MED ORDER — NICOTINE 21 MG/24HR TD PT24
21.0000 mg | MEDICATED_PATCH | Freq: Every day | TRANSDERMAL | Status: DC
  Administered 2017-06-24: 21 mg via TRANSDERMAL
  Filled 2017-06-24: qty 1

## 2017-06-24 MED ORDER — ASPIRIN EC 81 MG PO TBEC: 81.0000 mg | DELAYED_RELEASE_TABLET | Freq: Every day | ORAL | 1 refills | Status: DC

## 2017-06-24 MED ORDER — POTASSIUM CHLORIDE CRYS ER 10 MEQ PO TBCR
50.0000 meq | EXTENDED_RELEASE_TABLET | Freq: Once | ORAL | Status: AC
  Administered 2017-06-24: 50 meq via ORAL
  Filled 2017-06-24: qty 1

## 2017-06-24 NOTE — Consult Note (Signed)
[facility] [place]  Patient name: Melanie Lucas MRN: [id number] DOB: 02/27/1950 Sex: female  REASON FOR CONSULT: Episode of a aphasia, TIA  HPI: Melanie Lucas is a 67 y.o. female, who is admitted with episode of a aphasia.  She reports that this lasted for approximately 2 hours.  She had had episodes of a aphasia approximately 3 years ago with 2 events that time.  She does have history of chronic atrial fibrillation on Eliquis.  She has a history of coronary artery disease with prior coronary stenting as well.  She is a cigarette smoker and is tried to stop in the past which was unsuccessful.  Also COPD.  History of lymphedema since age 35 right greater than left  Past Medical History:  Diagnosis Date  . Cancer (HCC)    Skin- leg   . Chronic acquired lymphedema    a. R>L;  b. 03/2012 Neg LE U/S for DVT. Legs since age 67  . Complication of anesthesia    woke up during colonoscopy, Lithrostrixpy, Biospy  . COPD (chronic obstructive pulmonary disease) ([place])   . Coronary artery disease   . CVD (cardiovascular disease)   . Diabetes mellitus    Type II  . Dysrhythmia    afib  . Elevated TSH    a. 10/2012 - inst to f/u PCP.  [name] Hematuria    a. while on pradaxa,  she reports that she has seen Dr Margie Ege and had low risk cystoscopy  . History of kidney stones   . Hyperlipidemia   . Hypertension    patient denies  . Lacunar infarction ([place])    a. 02/2009 non-acute Lacunar infarct of the right thalamus noted on MRI of brain.  [name] Lymphedema of extremity    had this problem, since she was a teenager. especially seen in R LE  . Meniere disease   . Myocardial infarction ([place])   . Obesity   . Osteoarthritis    cervical & lumbar region, knees, hands cramp also   . PAF with post-termination pauses    a. on dronedarone;  b. CHA2DS2VASc = 5 (HTN, DM, h/o lacunar infarct on MRI, Female) ->refused oral anticoagulation after h/o hematuria on  pradaxa;  c. 02/2012 Echo: EF 55-60%, mildly dil LA. D. Recurrent PAF 10/2012 after only taking Multaq 1x/day - spont conv to NSR, placed back on BID Multaq/eliquis;  e. 10/2014 Multaq d/c'd->tikosyn initiated.  . Shortness of breath dyspnea    with exertion  . Stroke [facility])    2 mini strokes  . Tobacco abuse     Family History  Problem Relation Age of Onset  . Heart attack Father   . Coronary artery disease Father        strong family hx  . Bladder Cancer Mother        bladder  . Leukemia Brother   . Breast cancer Maternal Aunt   . Breast cancer Paternal Aunt   . Breast cancer Maternal Aunt     SOCIAL HISTORY: Social History   Socioeconomic History  . Marital status: Married    Spouse name: Not on file  . Number of children: Not on file  . Years of education: Not on file  . Highest education level: Not on file  Occupational History  . Occupation: retired    Comment: Pharmacist, hospital - art  Social Needs  . Financial resource strain: Not on file  . Food insecurity:    Worry: Not on  file    Inability: Not on file  . Transportation needs:    Medical: Not on file    Non-medical: Not on file  Tobacco Use  . Smoking status: Current Every Day Smoker    Packs/day: 1.00    Years: 55.00    Pack years: 55.00    Types: Cigarettes  . Smokeless tobacco: Never Used  . Tobacco comment: not ready to quit  Substance and Sexual Activity  . Alcohol use: Yes    Comment: 1 beer per month  . Drug use: No  . Sexual activity: Never  Lifestyle  . Physical activity:    Days per week: Not on file    Minutes per session: Not on file  . Stress: Not on file  Relationships  . Social connections:    Talks on phone: Not on file    Gets together: Not on file    Attends religious service: Not on file    Active member of club or organization: Not on file    Attends meetings of clubs or organizations: Not on file    Relationship status: Not on file  . Intimate partner violence:    Fear of current or  ex partner: Not on file    Emotionally abused: Not on file    Physically abused: Not on file    Forced sexual activity: Not on file  Other Topics Concern  . Not on file  Social History Narrative   She operates an entertainment business    Allergies  Allergen Reactions  . Avelox [Moxifloxacin]     unknown  . Chantix [Varenicline]     nightmares  . Fosamax [Alendronate]     Pain all over  . Other     Blood thinners-extreme bleeding  . Statins Other (See Comments) and Nausea And Vomiting    Muscle pain Other reaction(s): Dizziness (intolerance)  . Sulfamethoxazole Nausea And Vomiting    Other reaction(s): Dizziness (intolerance)  . Sulfonamide Derivatives Hives    Nausea vertigo  . Ciprofloxacin Rash  . Gabapentin Rash    burning  . Tape Itching and Rash    Please use "paper" tape only    Current Facility-Administered Medications  Medication Dose Route Frequency Provider Last Rate Last Dose  . acetaminophen (TYLENOL) tablet 650 mg  650 mg Oral Q4H PRN Etta Quill, DO   650 mg at 06/23/17 2042   Or  . acetaminophen (TYLENOL) solution 650 mg  650 mg Per Tube Q4H PRN Etta Quill, DO       Or  . acetaminophen (TYLENOL) suppository 650 mg  650 mg Rectal Q4H PRN Etta Quill, DO      . albuterol (PROVENTIL) (2.5 MG/3ML) 0.083% nebulizer solution 2.5 mg  2.5 mg Inhalation Q6H PRN Etta Quill, DO      . apixaban (ELIQUIS) tablet 5 mg  5 mg Oral BID Burnetta Sabin L, NP   5 mg at 06/24/17 1315  . diazepam (VALIUM) tablet 2 mg  2 mg Oral Daily PRN Etta Quill, DO      . dofetilide (TIKOSYN) capsule 500 mcg  500 mcg Oral BID Jennette Kettle M, DO   500 mcg at 06/24/17 1315  . DULoxetine (CYMBALTA) DR capsule 30 mg  30 mg Oral Liz Beach M, DO   30 mg at 06/23/17 2042  . insulin aspart (novoLOG) injection 0-15 Units  0-15 Units Subcutaneous TID WC Etta Quill, DO   5 Units at 06/24/17  1300  . multivitamin with minerals tablet 1 tablet  1 tablet Oral  QHS Jennette Kettle M, DO      . nicotine (NICODERM CQ - dosed in mg/24 hours) patch 21 mg  21 mg Transdermal Daily Caren Griffins, MD   21 mg at 06/24/17 1055  . rosuvastatin (CRESTOR) tablet 5 mg  5 mg Oral QHS Jennette Kettle M, DO   5 mg at 06/23/17 2241   Current Outpatient Medications  Medication Sig Dispense Refill  . albuterol (PROVENTIL HFA;VENTOLIN HFA) 108 (90 Base) MCG/ACT inhaler Inhale 1-2 puffs into the lungs every 6 (six) hours as needed for wheezing or shortness of breath.    . diazepam (VALIUM) 2 MG tablet Take 2 mg by mouth daily as needed for anxiety.     . dofetilide (TIKOSYN) 500 MCG capsule Take 1 capsule (500 mcg total) 2 (two) times daily by mouth. 180 capsule 2  . DULoxetine (CYMBALTA) 30 MG capsule Take 1 capsule by mouth every other day.     [name] ELIQUIS 5 MG TABS tablet TAKE 1 TABLET (5 MG TOTAL) BY MOUTH 2 (TWO) TIMES DAILY. 60 tablet 9  . Empagliflozin-Metformin HCl ER (SYNJARDY XR) 11-998 MG TB24 Take 1 tablet by mouth 2 (two) times daily.     . furosemide (LASIX) 20 MG tablet Take 20 mg by mouth daily.     [name] ibuprofen (ADVIL,MOTRIN) 800 MG tablet Take 800 mg by mouth 3 (three) times daily as needed for mild pain.     . Multiple Vitamin (MULTIVITAMIN) tablet Take 1 tablet by mouth at bedtime.     . rosuvastatin (CRESTOR) 5 MG tablet Take 5 mg by mouth at bedtime.     [name] VALTREX 500 MG tablet Take 500 mg by mouth 2 (two) times daily as needed (for outbreaks).     . zoledronic acid (RECLAST) 5 MG/100ML SOLN injection Inject 5 mg into the vein every 6 (six) months.     [name] aspirin EC 81 MG tablet Take 1 tablet (81 mg total) by mouth daily. 30 tablet 1  . [START ON 06/25/2017] nicotine (NICODERM CQ - DOSED IN MG/24 HOURS) 21 mg/24hr patch Place 1 patch (21 mg total) onto the skin daily. 28 patch 0    REVIEW OF SYSTEMS:   Reviewed and her history and physical with nothing to add   PHYSICAL EXAM: Vitals:   06/24/17 0137 06/24/17 0330 06/24/17 0530 06/24/17 1201  BP:  (!) 151/54 (!) 147/76 (!) 125/48 (!) 145/68  Pulse: (!) 59 61 (!) 55 (!) 49  Resp: 16 17 16 18   Temp: 98.7 F (37.1 C) 98.7 F (37.1 C) 98.7 F (37.1 C) 98.5 F (36.9 C)  TempSrc: Oral Oral Oral Oral  SpO2: 96% 96% 97% 98%  Weight:      Height:        GENERAL: The patient is a well-nourished female, in no acute distress. The vital signs are documented above. CARDIOVASCULAR: 2+ radial 2+ popliteal and 2+ dorsalis pedis pulses.  She does have changes of lymphedema with no skin changes right greater than left PULMONARY: There is good air exchange  ABDOMEN: Soft and non-tender  MUSCULOSKELETAL: There are no major deformities or cyanosis. NEUROLOGIC: No focal weakness or paresthesias are detected. SKIN: There are no ulcers or rashes noted. PSYCHIATRIC: The patient has a normal affect.  DATA:  I reviewed her CT angiogram.  This does show 50 to 60% stenoses bilaterally.  She does have a very irregular plaque on  the left with what appears to be soft central area with calcification surrounding this.  MEDICAL ISSUES: Had a long discussion with the patient, her husband and daughter present.  Explained that it is impossible to know if this was the cause of her event.  I did feel that this was the most likely source of her left brain event.  She is right-handed.  I spoke with Dr.Xu with the stroke service who also agrees this is the most likely source.  I have recommended carotid endarterectomy for reduction of stroke risk.  She and her family are questioning whether this can be delayed until after an upcoming trip.  I did explain the only risk would be having another neurologic event prior to correction of her carotid stenosis and would recommend surgery sooner than later.  Explained that I was available on Wednesday after a 2-day discontinuation of Eliquis.  After my meeting with the patient, the patient decided to be discharged.  I will arrange follow-up to schedule surgery   Rosetta Posner, MD  [facility] Tel [phone] Pager [phone]

## 2017-06-24 NOTE — Progress Notes (Addendum)
STROKE TEAM PROGRESS NOTE   SUBJECTIVE (INTERVAL HISTORY) Her husband is at the bedside.  Daughter arrived during rounds. Neuro deficits resolved. MRI neg. She is taking eliquis bid as prescribed. She has had 3 episodes or speech related issues, once 3 years ago (difficulty reading on the computer and difficulty with understanding the TV), then again 2 years later (went to get a friend who had been stopped by the cops, difficulty w/ language), and again yesterday. Reports sudden onset midline temporal HA yesterday, unusual for her. She and husband report recent significant increase in stress (selling 3 homes and refinancing; also reports difficulty between the 2 of them). Reported HA between her eyes yesterday, pounding. She does not typically get HAs. No hx migraines.   OBJECTIVE Vitals:   06/23/17 2337 06/24/17 0137 06/24/17 0330 06/24/17 0530  BP: (!) 180/76 (!) 151/54 (!) 147/76 (!) 125/48  Pulse: (!) 51 (!) 59 61 (!) 55  Resp: 16 16 17 16   Temp: 98.1 F (36.7 C) 98.7 F (37.1 C) 98.7 F (37.1 C) 98.7 F (37.1 C)  TempSrc: Oral Oral Oral Oral  SpO2: 100% 96% 96% 97%  Weight:      Height:        CBC:  Recent Labs  Lab 06/23/17 1714 06/23/17 1716  WBC 7.1  --   NEUTROABS 3.6  --   HGB 16.3* 16.0*  HCT 46.7* 47.0*  MCV 92.8  --   PLT 171  --     Basic Metabolic Panel:  Recent Labs  Lab 06/23/17 1714 06/23/17 1716 06/24/17 1136  NA 141 142 141  K 3.4* 3.4* 3.5  CL 104 101 104  CO2 26  --  27  GLUCOSE 209* 204* 235*  BUN 13 14 11   CREATININE 0.67 0.60 0.75  CALCIUM 9.0  --  8.8*  MG  --   --  2.1    Lipid Panel:     Component Value Date/Time   CHOL 129 06/24/2017 0616   TRIG 87 06/24/2017 0616   HDL 51 06/24/2017 0616   CHOLHDL 2.5 06/24/2017 0616   VLDL 17 06/24/2017 0616   LDLCALC 61 06/24/2017 0616   HgbA1c:  Lab Results  Component Value Date   HGBA1C 8.5 (H) 06/24/2017   Urine Drug Screen:     Component Value Date/Time   LABOPIA NONE DETECTED  06/23/2017 2028   COCAINSCRNUR NONE DETECTED 06/23/2017 2028   LABBENZ NONE DETECTED 06/23/2017 2028   AMPHETMU NONE DETECTED 06/23/2017 2028   THCU NONE DETECTED 06/23/2017 2028   LABBARB NONE DETECTED 06/23/2017 2028    Alcohol Level No results found for: ETH  IMAGING  Ct Head Code Stroke Wo Contrast 06/23/2017 1. No acute intracranial infarct or other process identified. 2. ASPECTS is 10. 3. Age-related cerebral atrophy with chronic small vessel ischemic disease.   Ct Angio Head W Or Wo Contrast Ct Angio Neck W And/or Wo Contrast 06/23/2017 1. Negative CTA for emergent large vessel occlusion. 2. Atheromatous plaque about the carotid bifurcations with associated stenoses of up to 50-60% by NASCET criteria, right slightly worse than left. 3. Moderate atherosclerotic change throughout the carotid siphons without high-grade stenosis. No proximal high-grade or correctable stenosis identified.   Mr Kizzie Fantasia Contrast   06/23/2017 1. No acute intracranial abnormality. 2. Moderate cerebral white matter changes, progressed relative to most recent MRI from 10/12/2014. While these findings may be related to chronic microvascular ischemic changes, several of these foci are oriented perpendicular to the lateral  ventricles in a distribution that is commonly seen with demyelinating disease.   Dg Chest 2 View 06/23/2017 No acute cardiopulmonary process.    PHYSICAL EXAM Patient alert and oriented x 3. Speech clear. No aphasia. No dysarthria. Extraoccular movements intact. Visual fields full. Pupils equal and reactive. Face symmetric. Tongue midline. Follows complex commands without difficulty. Moves all extremities x 4. Strength normal. no drift in arms or legs. Coordination normal. Sensation intact. Heart rate regular. Breath sounds clear.    ASSESSMENT/PLAN Ms. SABRINIA PRIEN is a 67 y.o. female with history of lymphedema, COPD, coronary artery disease, diabetes, A. fib on Eliquis, prior stroke, MI,  tobacco and alcohol use presenting to [facility] with word finding difficulties and slurred speech.   L brain TIA  Code Stroke CT head No acute stroke. Small vessel disease. Atrophy. ASPECTS 10.     CTA head and neck no ELVO.  Bilateral ICA stenosis 50 to 60% read as right greater than left though neurology feels left may be greater than right.  Moderate atherosclerosis.  MRI head no acute stroke.  Moderate white matter changes progressed since 2016.  2D Echo  pending   LDL 61  HgbA1c 8.5  SCDs for VTE prophylaxis. Will resume Eliquis  Eliquis (apixaban) daily prior to admission (confirmed she was taking as prescribed, 5 mg twice daily), now on aspirin 325 mg daily.  Will resume Eliquis and discontinue aspirin.  Continue Eliquis at discharge.  Patient counseled to be compliant with her antithrombotic medications  Ongoing aggressive stroke risk factor management  Therapy recommendations: No anticipated therapy needs.  Patient back to baseline  Disposition: Return home  Followed by Dr. Carles Collet as an outpatient for tremors and peripheral neuropathy. Follow up with her recommended. Added to d/c followup.  Carotid stenosis   CTA neck - Bilateral ICA stenosis 50 to 60% -  read as right greater than left though neurology feels left may be greater than right.    Discussed with Dr. Erlinda Hong - concern ICA stenosis could be related to recurrent language difficulties in setting of ongoing tobacco use  VVS consult recommended - angio now vs f/u in 6 mos   Discussed with Dr. Cruzita Lederer  Hypertension  Elevated on arrival at 205/82.  Now normalized at 125/48   Stable now . Permissive hypertension (OK if < 220/120) but gradually normalize in 5-7 days . Long-term BP goal normotensive  Hyperlipidemia  Home meds: Crestor 5, resumed in hospital (intolerant to simvastatin in the past with myalgias)  LDL 61, goal < 70  Continue statin at discharge  Diabetes type II  HgbA1c 8.5, goal <  7.0  Uncontrolled  Other Stroke Risk Factors  Advanced age  Cigarette smoker, advised to stop smoking  ETOH use, advised to drink no more than 1 drink(s) a day  UDS negative / ETOH level not performed   Hx stroke/TIA - pt reports  L brain TIA 2 years ago  2016 - L brain TIA 3 years ago. MRI no acute stroke  MRI 02/2009 R thalamic infarct new since 2007 but not acute  MRI 04/2005 no acute stroke  No hx of Migraines  Hospital day # [place], MSN, APRN, ANVP-BC, AGPCNP-BC Advanced Practice Stroke Nurse Lucas for Schedule & Pager information 06/24/2017 1:25 PM   ATTENDING NOTE: I reviewed above note and agree with the assessment and plan. I have made any additions or clarifications directly to the above note. Pt was seen  and examined.   67 year old female with history of CAD/MI, DM, A. fib on Eliquis, HLD, HTN, obesity, smoker, essential tremor and neuropathy admitted for transient episode of word finding difficulty, slurred speech, lasting 1.5 hours.  Currently neuro intact.  CT no acute abnormality.  CTA head and neck, bilateral ICA 50 to 60% stenosis, left more than right with soft plaque.  MRI negative for acute infarct.  EF 50 to 55%.  UDS negative.  LDL 61 and A1c 8.5.  BP stable.  Her symptoms concerning for symptomatic left ICA stenosis with soft plaque.  Although symptoms also could be due to A. Fib, she is on full dose anticoagulation.  Had vascular surgery Dr. early consultation and recommended left CEA treatment in 2 days.  Educated on smoking cessation and better DM management.  Recommend continue Eliquis and add aspirin 81.  Continue Crestor.  I long discussion with patient regarding risks and benefit of left CEA and timing of the procedure.  However, patient had incoming cruise trip to [place] in the middle of this month, she decided not to proceed with procedure at this time.  She would like to follow-up with Dr. early as outpatient  after she comes back from the trip.  Neurology will sign off. Please call with questions. Pt will follow up with Dr. Carles Collet at [facility] in about 4 weeks. Thanks for the consult.  Rosalin Hawking, MD PhD Stroke Neurology 06/24/2017 10:01 PM   To contact Stroke Continuity provider, please refer to [email or web address] After hours, contact General Neurology

## 2017-06-24 NOTE — Evaluation (Signed)
Physical Therapy Evaluation Patient Details Name: Melanie Lucas MRN: [id number] DOB: [birth year] Today's Date: 06/24/2017   History of Present Illness  Pt is a 67 y/o female who presents to the ED with word finding difficulty and slurred speech. MRI negative for acute changes. CVA, DOE, MI, Meniere Disease, Lymphedema BLE's, DM, CAD, COPD, lymphedema since age 65, [place].   Clinical Impression  Pt admitted with above diagnosis. Pt currently with functional limitations due to the deficits listed below (see PT Problem List). At the time of PT eval pt demonstrating word finding difficulty and inappropriate answers to history questions. Pt required increased time to follow commands and at times followed incorrectly. For example, pt sitting up in bed with one leg off edge and one leg in bed; when asked to sit on the edge of the bed and put feet on floor, pt instead returned to supine. Appeared confused when therapist indicated that wasn't correct. Noted minor balance deficits requiring min assist throughout OOB mobility. Feel pt would benefit from continued therapies at the CIR level, to maximize functional independence and return to PLOF. Acutely, pt will benefit from skilled PT to increase their independence and safety with mobility to allow discharge to the venue listed below.       Follow Up Recommendations CIR;Supervision/Assistance - 24 hour    Equipment Recommendations  None recommended by PT(TBD by next venue of care)    Recommendations for Other Services Rehab consult     Precautions / Restrictions Precautions Precautions: Fall Restrictions Weight Bearing Restrictions: No      Mobility  Bed Mobility Overal bed mobility: Modified Independent Bed Mobility: Supine to Sit           General bed mobility comments: Pt easily maneuvering in the bed and going from long sitting, to/from EOB. No use of rails and HOB flat throughout.   Transfers Overall transfer level: Needs  assistance Equipment used: 1 person hand held assist Transfers: Sit to/from Stand Sit to Stand: Min assist         General transfer comment: Assist for balance support and safety. Pt requires increased time and appears guarded with wide stance during sit<>stand.   Ambulation/Gait Ambulation/Gait assistance: Min assist Ambulation Distance (Feet): 175 Feet Assistive device: 1 person hand held assist Gait Pattern/deviations: Step-through pattern;Decreased stride length;Drifts right/left Gait velocity: Decreased Gait velocity interpretation: <1.31 ft/sec, indicative of household ambulator General Gait Details: Drifts during gait training. Pt appears to be unaware of balance disturbance and requires frequent min assist to recover. With the addition of head turns, pt reports dizziness (horizontal worse than vertical).   Stairs            Wheelchair Mobility    Modified Rankin (Stroke Patients Only) Modified Rankin (Stroke Patients Only) Pre-Morbid Rankin Score: No symptoms Modified Rankin: Moderately severe disability     Balance Overall balance assessment: Needs assistance Sitting-balance support: Feet supported;No upper extremity supported Sitting balance-Leahy Scale: Fair     Standing balance support: No upper extremity supported;During functional activity Standing balance-Leahy Scale: Poor Standing balance comment: Occasional min assist provided to prevent loss of balance                             Pertinent Vitals/Pain Pain Assessment: Faces Faces Pain Scale: Hurts little more Pain Location: R neck with strength testing.  Pain Descriptors / Indicators: Aching Pain Intervention(s): Monitored during session    Home Living Family/patient expects to be  discharged to:: Private residence Living Arrangements: Spouse/significant other Available Help at Discharge: Family;Available 24 hours/day Type of Home: House Home Access: Stairs to enter Entrance  Stairs-Rails: Psychiatric nurse of Steps: 4 Home Layout: One level(Has upper level but rents it) Home Equipment: Grab bars - tub/shower      Prior Function Level of Independence: Independent               Hand Dominance        Extremity/Trunk Assessment   Upper Extremity Assessment Upper Extremity Assessment: Defer to OT evaluation    Lower Extremity Assessment Lower Extremity Assessment: Generalized weakness;Difficult to assess due to impaired cognition(Pt confused with MMT instruction)    Cervical / Trunk Assessment Cervical / Trunk Assessment: Other exceptions Cervical / Trunk Exceptions: s/p cervical surgery Jan 2018, Noted forward head posture with rounded shoulders  Communication   Communication: Expressive difficulties  Cognition Arousal/Alertness: Awake/alert Behavior During Therapy: Flat affect Overall Cognitive Status: No family/caregiver present to determine baseline cognitive functioning Area of Impairment: Orientation;Attention;Memory;Following commands;Safety/judgement;Awareness;Problem solving                 Orientation Level: Disoriented to;Time;Situation Current Attention Level: Sustained Memory: Decreased short-term memory Following Commands: Follows one step commands inconsistently;Follows one step commands with increased time;Follows multi-step commands inconsistently Safety/Judgement: Decreased awareness of safety;Decreased awareness of deficits Awareness: Intellectual Problem Solving: Slow processing;Decreased initiation;Difficulty sequencing;Requires verbal cues        General Comments      Exercises     Assessment/Plan    PT Assessment Patient needs continued PT services  PT Problem List Decreased strength;Decreased range of motion;Decreased activity tolerance;Decreased balance;Decreased mobility;Decreased knowledge of use of DME;Decreased safety awareness;Decreased knowledge of precautions;Pain       PT  Treatment Interventions DME instruction;Gait training;Stair training;Functional mobility training;Therapeutic activities;Therapeutic exercise;Neuromuscular re-education;Patient/family education    PT Goals (Current goals can be found in the Care Plan section)  Acute Rehab PT Goals Patient Stated Goal[place] PT Goal Formulation: With patient Time For Goal Achievement: 07/08/17 Potential to Achieve Goals: Good    Frequency Min 4X/week   Barriers to discharge Decreased caregiver support Per pt "my husband is just as bad as I am, he can't help me at home." --unsure how accurate this is.     Co-evaluation               AM-PAC PT "6 Clicks" Daily Activity  Outcome Measure Difficulty turning over in bed (including adjusting bedclothes, sheets and blankets)?: None Difficulty moving from lying on back to sitting on the side of the bed? : None Difficulty sitting down on and standing up from a chair with arms (e.g., wheelchair, bedside commode, etc,.)?: A Little Help needed moving to and from a bed to chair (including a wheelchair)?: A Little Help needed walking in hospital room?: A Little Help needed climbing 3-5 steps with a railing? : A Little 6 Click Score: 20    End of Session Equipment Utilized During Treatment: Gait belt Activity Tolerance: Patient tolerated treatment well Patient left: in bed;with call bell/phone within reach;with bed alarm set Nurse Communication: Mobility status PT Visit Diagnosis: Unsteadiness on feet (R26.81);Pain;Difficulty in walking, not elsewhere classified (R26.2)    Time: 0254-2706 PT Time Calculation (min) (ACUTE ONLY): 24 min   Charges:   PT Evaluation $PT Eval Moderate Complexity: 1 Mod PT Treatments $Gait Training: 8-22 mins   PT G Codes:  Rolinda Roan, PT, DPT Acute Rehabilitation Services Pager: [phone]   Thelma Comp 06/24/2017, 10:36 AM

## 2017-06-24 NOTE — Progress Notes (Signed)
  Echocardiogram 2D Echocardiogram has been performed.  Catha Ontko T Tensley Wery 06/24/2017, 11:38 AM

## 2017-06-24 NOTE — Discharge Instructions (Signed)

## 2017-06-24 NOTE — Discharge Summary (Signed)
Physician Discharge Summary  Melanie Lucas [id number]:[id number] DOB: 1950/12/21 DOA: 06/23/2017  PCP: Dineen Kid, MD  Admit date: 06/23/2017 Discharge date: 06/24/2017  Admitted From: home Disposition:  Home (patient declined CIR)  Recommendations for Outpatient Follow-up:  1. Follow up with PCP in 1-2 weeks 2. Follow up with Dr. Donnetta Hutching with Vascular surgery in 2-3 weeks 3. Follow up with Dr. Carles Collet in 2-3 weeks  Home Health: PT Equipment/Devices: none  Discharge Condition: stable CODE STATUS: Full code Diet recommendation: heart healthy  HPI: Per Dr. Alcario Drought, Melanie Lucas is a 67 y.o. female with medical history significant of A.Fib on eliquis, CAD, CVD, DM2, TIA x2, prior lacunar infarct. Patient presents to the ED with c/o expressive aphasia.  LKW 3pm, started having inability to speak.  Ongoing for nearly 2 hours.  Seen in triage and concern for acute stroke.  Code stroke canceled as she is on eliquis and not a candidate for TPA. ED Course: Speaking more clearly initially in ED when seen by EDP.  SBPs 200s.  Later on symptoms seemed to reoccur some, SBPs were 170s at that time, no BP treatment performed.  Symptoms seem to be improving again now (waxing and waning symptoms).  Hospital Course: TIA -patient was admitted to the hospital with expressive aphasia and concern for CVA.  Neurology was consulted and followed patient while hospitalized.  She underwent an MRI of the brain which was negative for acute CVA.  She underwent a CT angiogram of the head and neck which showed atheromatous plaque about the carotid bifurcations with associated stenosis of up to 50 to 60%, slightly worse on the right and left.  Vascular surgery was consulted and she was evaluated by Dr. Donnetta Hutching, with plans for surgery however patient has an upcoming trip to [place] in a week and would like to wait up until she comes back.  Lipid panel showed an LDL of 61, continue statin.  A1c was elevated at 8.5.  She underwent a 2D echo  which showed normal EF 50 to 55%, and grade 1 diastolic dysfunction, as well as mild increase in her PA pressure.  She is on Eliquis for A. fib which is to be continued, she was also placed on low-dose aspirin per neurology recommendations Hypertension -resume home medications Paroxysmal A. Fib -chads VASC score > 2, continue Eliquis, she is on Tikosyn, continue Type 2 diabetes mellitus -continue home medications, to be further addressed as an outpatient Tobacco use -extensive discussion with patient at bedside regarding tobacco cessation, she does not seem to interested at this point but knows she needs to quit.  I have given her nicotine patch prescription, she tried pharmaceutical approaches in the past with Chantix and Wellbutrin which did not help  Discharge Diagnoses:  Principal Problem:   Acute ischemic stroke [facility]) Active Problems:   Essential hypertension, benign   A-fib ([place])   Diabetes mellitus ([employer])   TIA (transient ischemic attack)   Discharge Instructions  Allergies as of 06/24/2017      Reactions   Avelox [moxifloxacin]    unknown   Chantix [varenicline]    nightmares   Fosamax [alendronate]    Pain all over   Other    Blood thinners-extreme bleeding   Statins Other (See Comments), Nausea And Vomiting   Muscle pain Other reaction(s): Dizziness (intolerance)   Sulfamethoxazole Nausea And Vomiting   Other reaction(s): Dizziness (intolerance)   Sulfonamide Derivatives Hives   Nausea vertigo   Ciprofloxacin Rash   Gabapentin Rash  burning   Tape Itching, Rash   Please use "paper" tape only      Medication List    TAKE these medications   albuterol 108 (90 Base) MCG/ACT inhaler Commonly known as:  PROVENTIL HFA;VENTOLIN HFA Inhale 1-2 puffs into the lungs every 6 (six) hours as needed for wheezing or shortness of breath.   aspirin EC 81 MG tablet Take 1 tablet (81 mg total) by mouth daily.   CYMBALTA 30 MG capsule Generic drug:  DULoxetine Take 1 capsule  by mouth every other day.   diazepam 2 MG tablet Commonly known as:  VALIUM Take 2 mg by mouth daily as needed for anxiety.   dofetilide 500 MCG capsule Commonly known as:  TIKOSYN Take 1 capsule (500 mcg total) 2 (two) times daily by mouth.   ELIQUIS 5 MG Tabs tablet Generic drug:  apixaban TAKE 1 TABLET (5 MG TOTAL) BY MOUTH 2 (TWO) TIMES DAILY.   furosemide 20 MG tablet Commonly known as:  LASIX Take 20 mg by mouth daily.   ibuprofen 800 MG tablet Commonly known as:  ADVIL,MOTRIN Take 800 mg by mouth 3 (three) times daily as needed for mild pain.   multivitamin tablet Take 1 tablet by mouth at bedtime.   nicotine 21 mg/24hr patch Commonly known as:  NICODERM CQ - dosed in mg/24 hours Place 1 patch (21 mg total) onto the skin daily. Start taking on:  06/25/2017   RECLAST 5 MG/100ML Soln injection Generic drug:  zoledronic acid Inject 5 mg into the vein every 6 (six) months.   rosuvastatin 5 MG tablet Commonly known as:  CRESTOR Take 5 mg by mouth at bedtime.   SYNJARDY XR 11-998 MG Tb24 Generic drug:  Empagliflozin-metFORMIN HCl ER Take 1 tablet by mouth 2 (two) times daily.   VALTREX 500 MG tablet Generic drug:  valACYclovir Take 500 mg by mouth 2 (two) times daily as needed (for outbreaks).      Follow-up Information    Tat, Eustace Quail, DO. Schedule an appointment as soon as possible for a visit in 4 week(s).   Specialty:  Neurology Why:  for hospital (TIA) follow up Contact information: [address] [place] [address] [phone]        Rosetta Posner, MD. Call in 1 day(s).   Specialties:  Vascular Surgery, Cardiology Why:  call in 1 day to schedule an appointment within 2-3 weeks Contact information: [place] [address] [phone]           Consultations:  Neurology   Vascular surgery   Procedures/Studies:  2D echo  Study Conclusions - Left ventricle: The cavity size was normal. Wall  thickness was increased in a pattern of mild LVH. Systolic function was normal. The estimated ejection fraction was in the range of 50% to 55%. Wall motion was normal; there were no regional wall motion abnormalities. Doppler parameters are consistent with abnormal left ventricular relaxation (grade 1 diastolic dysfunction). - Mitral valve: There was trivial regurgitation. - Left atrium: The atrium was moderately dilated. - Tricuspid valve: There was mild regurgitation. - Pulmonary arteries: Systolic pressure was mildly increased. PA peak pressure: 32 mm Hg (S).   Ct Angio Head W Or Wo Contrast  Result Date: 06/23/2017 CLINICAL DATA:  Initial evaluation for acute speech difficulty. EXAM: CT ANGIOGRAPHY HEAD AND NECK TECHNIQUE: Multidetector CT imaging of the head and neck was performed using the standard protocol during bolus administration of intravenous contrast. Multiplanar CT  image reconstructions and MIPs were obtained to evaluate the vascular anatomy. Carotid stenosis measurements (when applicable) are obtained utilizing NASCET criteria, using the distal internal carotid diameter as the denominator. CONTRAST:  <See Chart> ISOVUE-370 IOPAMIDOL (ISOVUE-370) INJECTION 76% COMPARISON:  Prior noncontrast head CT from earlier the same day. FINDINGS: CTA NECK FINDINGS Aortic arch: Visualized aortic arch of normal caliber with normal branch pattern. Mild scattered atheromatous plaque within the arch and about the origin of the great vessels without flow-limiting stenosis. Visualized subclavian arteries widely patent. Right carotid system: Right common carotid artery widely patent to the bifurcation. A centric calcified plaque at the proximal right ICA with up to 60% stenosis by NASCET criteria. Right ICA mildly tortuous but otherwise patent to the skull base without hemodynamically significant stenosis, dissection, or occlusion. Left carotid system: Left common carotid artery patent to the bifurcation without  flow-limiting stenosis. Eccentric calcified plaque about the proximal left ICA with associated stenosis of approximately 50% by NASCET criteria. Left ICA tortuous but otherwise widely patent to the skull base without stenosis, dissection, or occlusion. Vertebral arteries: Both of the vertebral arteries arise from the subclavian arteries. Vertebral arteries widely patent within the neck without stenosis, dissection, or occlusion. Skeleton: No acute osseous abnormality. No worrisome lytic or blastic osseous lesions. Patient is status post ACDF at C5 through C7 with C6 corpectomy. Patient is edentulous. Other neck: No acute soft tissue abnormality within the neck. Salivary glands within normal limits. Thyroid normal. No adenopathy. Upper chest: Visualized upper chest within normal limits. Visualized lungs are grossly clear. Review of the MIP images confirms the above findings CTA HEAD FINDINGS Anterior circulation: Mild scattered plaque within the petrous ICAs bilaterally without significant narrowing on the right. Associated mild stenosis on the left. Scattered atheromatous plaque within the cavernous/supraclinoid ICAs with mild multifocal narrowing. ICA termini widely patent. A1 segments somewhat irregular but patent without high-grade stenosis. Right A1 dominant. Patent and normal anterior communicating artery. Anterior cerebral arteries widely patent to their distal aspects without stenosis. M1 segments widely patent without stenosis. No proximal M2 occlusion. Distal MCA branches well perfused and symmetric. Posterior circulation: Vertebral arteries codominant and patent to the vertebrobasilar junction without stenosis. Patent right PICA. Left PICA not visualized. Basilar widely patent to its distal aspect. Dominant left AICA. Superior cerebral arteries patent bilaterally. PCAs primarily supplied via the basilar and are widely patent to their distal aspects. Small right posterior communicating artery. Venous  sinuses: Grossly patent. Anatomic variants: None significant.  No aneurysm. Delayed phase: No abnormal enhancement. Review of the MIP images confirms the above findings IMPRESSION: 1. Negative CTA for emergent large vessel occlusion. 2. Atheromatous plaque about the carotid bifurcations with associated stenoses of up to 50-60% by NASCET criteria, right slightly worse than left. 3. Moderate atherosclerotic change throughout the carotid siphons without high-grade stenosis. No proximal high-grade or correctable stenosis identified. Electronically Signed   By: Jeannine Boga M.D.   On: 06/23/2017 19:02   Dg Chest 2 View  Result Date: 06/23/2017 CLINICAL DATA:  She has some remnant of verbal hesitancy/aphasia. She c/o left temporal area h/a and is in no distress. Her husband is with her. Pt is lethargic. H/o COPD, Diabetes, Dysrhythmia, Myocardial Infarction, Stroke. EXAM: CHEST - 2 VIEW COMPARISON:  Chest radiograph 11/05/2016 FINDINGS: Normal cardiac silhouette. No effusion, infiltrate pneumothorax. Anterior cervical fusion IMPRESSION: No acute cardiopulmonary process. Electronically Signed   By: Suzy Bouchard M.D.   On: 06/23/2017 21:15   Ct Angio Neck W And/or Wo Contrast  Result Date: 06/23/2017 CLINICAL DATA:  Initial evaluation for acute speech difficulty. EXAM: CT ANGIOGRAPHY HEAD AND NECK TECHNIQUE: Multidetector CT imaging of the head and neck was performed using the standard protocol during bolus administration of intravenous contrast. Multiplanar CT image reconstructions and MIPs were obtained to evaluate the vascular anatomy. Carotid stenosis measurements (when applicable) are obtained utilizing NASCET criteria, using the distal internal carotid diameter as the denominator. CONTRAST:  <See Chart> ISOVUE-370 IOPAMIDOL (ISOVUE-370) INJECTION 76% COMPARISON:  Prior noncontrast head CT from earlier the same day. FINDINGS: CTA NECK FINDINGS Aortic arch: Visualized aortic arch of normal caliber with  normal branch pattern. Mild scattered atheromatous plaque within the arch and about the origin of the great vessels without flow-limiting stenosis. Visualized subclavian arteries widely patent. Right carotid system: Right common carotid artery widely patent to the bifurcation. A centric calcified plaque at the proximal right ICA with up to 60% stenosis by NASCET criteria. Right ICA mildly tortuous but otherwise patent to the skull base without hemodynamically significant stenosis, dissection, or occlusion. Left carotid system: Left common carotid artery patent to the bifurcation without flow-limiting stenosis. Eccentric calcified plaque about the proximal left ICA with associated stenosis of approximately 50% by NASCET criteria. Left ICA tortuous but otherwise widely patent to the skull base without stenosis, dissection, or occlusion. Vertebral arteries: Both of the vertebral arteries arise from the subclavian arteries. Vertebral arteries widely patent within the neck without stenosis, dissection, or occlusion. Skeleton: No acute osseous abnormality. No worrisome lytic or blastic osseous lesions. Patient is status post ACDF at C5 through C7 with C6 corpectomy. Patient is edentulous. Other neck: No acute soft tissue abnormality within the neck. Salivary glands within normal limits. Thyroid normal. No adenopathy. Upper chest: Visualized upper chest within normal limits. Visualized lungs are grossly clear. Review of the MIP images confirms the above findings CTA HEAD FINDINGS Anterior circulation: Mild scattered plaque within the petrous ICAs bilaterally without significant narrowing on the right. Associated mild stenosis on the left. Scattered atheromatous plaque within the cavernous/supraclinoid ICAs with mild multifocal narrowing. ICA termini widely patent. A1 segments somewhat irregular but patent without high-grade stenosis. Right A1 dominant. Patent and normal anterior communicating artery. Anterior cerebral  arteries widely patent to their distal aspects without stenosis. M1 segments widely patent without stenosis. No proximal M2 occlusion. Distal MCA branches well perfused and symmetric. Posterior circulation: Vertebral arteries codominant and patent to the vertebrobasilar junction without stenosis. Patent right PICA. Left PICA not visualized. Basilar widely patent to its distal aspect. Dominant left AICA. Superior cerebral arteries patent bilaterally. PCAs primarily supplied via the basilar and are widely patent to their distal aspects. Small right posterior communicating artery. Venous sinuses: Grossly patent. Anatomic variants: None significant.  No aneurysm. Delayed phase: No abnormal enhancement. Review of the MIP images confirms the above findings IMPRESSION: 1. Negative CTA for emergent large vessel occlusion. 2. Atheromatous plaque about the carotid bifurcations with associated stenoses of up to 50-60% by NASCET criteria, right slightly worse than left. 3. Moderate atherosclerotic change throughout the carotid siphons without high-grade stenosis. No proximal high-grade or correctable stenosis identified. Electronically Signed   By: Jeannine Boga M.D.   On: 06/23/2017 19:02   Mr Jeri Cos IW Contrast  Result Date: 06/23/2017 CLINICAL DATA:  Initial evaluation for transient speech difficulty, memory loss. EXAM: MRI HEAD WITHOUT AND WITH CONTRAST TECHNIQUE: Multiplanar, multiecho pulse sequences of the brain and surrounding structures were obtained without and with intravenous contrast. CONTRAST:  83mL MULTIHANCE GADOBENATE DIMEGLUMINE 529  MG/ML IV SOLN COMPARISON:  Prior CT and CTA from earlier same day. FINDINGS: Brain: Diffuse prominence of the CSF containing spaces compatible with generalized cerebral atrophy. Patchy and confluent T2/FLAIR hyperintensity within the periventricular, deep, and subcortical white matter both cerebral hemispheres, nonspecific, but fairly progressed relative to previous MRI  from 10/12/2014. Patchy T2 signal abnormality seen within the pons and bilateral cerebellar peduncles as well. Few these foci are oriented perpendicular to the lateral ventricles. While these findings are nonspecific, and could be related to chronic microvascular ischemic changes, possible demyelinating disease could also have this appearance as well. No associated diffusion abnormality or enhancement to suggest active demyelination. No evidence for acute or subacute infarct. Gray-white matter differentiation maintained. No areas of remote cortical infarction. Small remote right thalamic infarct noted. No evidence for acute or chronic intracranial hemorrhage. No mass lesion, midline shift or mass effect. No hydrocephalus. No extra-axial fluid collection. Major dural sinuses are grossly patent. Pituitary gland suprasellar region normal. Midline structures intact and normal. Vascular: Major intracranial vascular flow voids are maintained. Skull and upper cervical spine: Craniocervical junction within normal limits. Upper cervical spine normal. Bone marrow signal intensity within normal limits. No scalp soft tissue abnormality. Sinuses/Orbits: Globes and orbital soft tissues within normal limits. Paranasal sinuses are largely clear. Small left mastoid effusion noted, of doubtful significance. Other: None. IMPRESSION: 1. No acute intracranial abnormality. 2. Moderate cerebral white matter changes, progressed relative to most recent MRI from 10/12/2014. While these findings may be related to chronic microvascular ischemic changes, several of these foci are oriented perpendicular to the lateral ventricles in a distribution that is commonly seen with demyelinating disease. Clinical correlation recommended. No evidence for active demyelination on this exam. Electronically Signed   By: Jeannine Boga M.D.   On: 06/23/2017 22:49   Ct Head Code Stroke Wo Contrast  Result Date: 06/23/2017 CLINICAL DATA:  Code stroke.  Initial evaluation for acute speech difficulty. EXAM: CT HEAD WITHOUT CONTRAST TECHNIQUE: Contiguous axial images were obtained from the base of the skull through the vertex without intravenous contrast. COMPARISON:  Prior MRI from 10/12/2014. FINDINGS: Brain: Age-related cerebral atrophy. Patchy and confluent hypodensity within the periventricular and deep white matter both cerebral hemispheres, most consistent with chronic small vessel ischemic changes. Small remote lacunar infarct noted within the right thalamus. No acute intracranial hemorrhage. No acute large vessel territory infarct. Vague hypodensity at the anterior left temporal lobe felt to be most consistent with artifact. No mass lesion, midline shift or mass effect. No hydrocephalus. No extra-axial fluid collection. Vascular: No asymmetric hyperdense vessel. Scattered vascular calcifications noted within the carotid siphons. Skull: Scalp soft tissues and calvarium within normal limits. Sinuses/Orbits: Globes and orbital soft tissues within normal limits. Paranasal sinuses and mastoid air cells are clear. Other: None. ASPECTS [facility] Stroke Program Early CT Score) - Ganglionic level infarction (caudate, lentiform nuclei, internal capsule, insula, M1-M3 cortex): 7 - Supraganglionic infarction (M4-M6 cortex): 3 Total score (0-10 with 10 being normal): 10 IMPRESSION: 1. No acute intracranial infarct or other process identified. 2. ASPECTS is 10. 3. Age-related cerebral atrophy with chronic small vessel ischemic disease. Critical Value/emergent results were called by telephone at the time of interpretation on 06/23/2017 at 5:31 pm to Dr. Marda Stalker , who verbally acknowledged these results. Electronically Signed   By: Jeannine Boga M.D.   On: 06/23/2017 17:33      Subjective: - no chest pain, shortness of breath, no abdominal pain, nausea or vomiting.   Discharge Exam: Vitals:  06/24/17 0530 06/24/17 1201  BP: (!) 125/48 (!) 145/68    Pulse: (!) 55 (!) 49  Resp: 16 18  Temp: 98.7 F (37.1 C) 98.5 F (36.9 C)  SpO2: 97% 98%    General: Pt is alert, awake, not in acute distress Cardiovascular: RRR, S1/S2 +, no rubs, no gallops Respiratory: CTA bilaterally, no wheezing, no rhonchi Abdominal: Soft, NT, ND, bowel sounds + Extremities: no edema, no cyanosis    The results of significant diagnostics from this hospitalization (including imaging, microbiology, ancillary and laboratory) are listed below for reference.     Microbiology: No results found for this or any previous visit (from the past 240 hour(s)).   Labs: BNP (last 3 results) No results for input(s): BNP in the last 8760 hours. Basic Metabolic Panel: Recent Labs  Lab 06/23/17 1714 06/23/17 1716 06/24/17 1136  NA 141 142 141  K 3.4* 3.4* 3.5  CL 104 101 104  CO2 26  --  27  GLUCOSE 209* 204* 235*  BUN 13 14 11   CREATININE 0.67 0.60 0.75  CALCIUM 9.0  --  8.8*  MG  --   --  2.1   Liver Function Tests: Recent Labs  Lab 06/23/17 1714  AST 19  ALT 24  ALKPHOS 77  BILITOT 0.8  PROT 6.5  ALBUMIN 3.7   No results for input(s): LIPASE, AMYLASE in the last 168 hours. No results for input(s): AMMONIA in the last 168 hours. CBC: Recent Labs  Lab 06/23/17 1714 06/23/17 1716  WBC 7.1  --   NEUTROABS 3.6  --   HGB 16.3* 16.0*  HCT 46.7* 47.0*  MCV 92.8  --   PLT 171  --    Cardiac Enzymes: No results for input(s): CKTOTAL, CKMB, CKMBINDEX, TROPONINI in the last 168 hours. BNP: Invalid input(s): POCBNP CBG: Recent Labs  Lab 06/23/17 1655 06/24/17 0620 06/24/17 1159  GLUCAP 210* 125* 241*   D-Dimer No results for input(s): DDIMER in the last 72 hours. Hgb A1c Recent Labs    06/24/17 0616  HGBA1C 8.5*   Lipid Profile Recent Labs    06/24/17 0616  CHOL 129  HDL 51  LDLCALC 61  TRIG 87  CHOLHDL 2.5   Thyroid function studies Recent Labs    06/24/17 0734  TSH 3.231   Anemia work up No results for input(s):  VITAMINB12, FOLATE, FERRITIN, TIBC, IRON, RETICCTPCT in the last 72 hours. Urinalysis    Component Value Date/Time   COLORURINE YELLOW 06/23/2017 2028   APPEARANCEUR HAZY (A) 06/23/2017 2028   LABSPEC 1.027 06/23/2017 2028   PHURINE 9.0 (H) 06/23/2017 2028   GLUCOSEU >=500 (A) 06/23/2017 2028   HGBUR NEGATIVE 06/23/2017 2028   BILIRUBINUR NEGATIVE 06/23/2017 2028   BILIRUBINUR negative 10/17/2015 1839   KETONESUR NEGATIVE 06/23/2017 2028   PROTEINUR NEGATIVE 06/23/2017 2028   UROBILINOGEN 0.2 10/17/2015 1839   UROBILINOGEN 1.0 09/20/2010 0025   NITRITE NEGATIVE 06/23/2017 2028   LEUKOCYTESUR NEGATIVE 06/23/2017 2028   Sepsis Labs Invalid input(s): PROCALCITONIN,  WBC,  LACTICIDVEN   Time coordinating discharge: 30 minutes  SIGNED:  Marzetta Board, MD  [facility] 06/24/2017, 4:09 PM Pager [phone]  If 7PM-7AM, please contact night-coverage [email or web address] Password [email or web address]

## 2017-06-24 NOTE — Progress Notes (Signed)
OT Cancellation Note  Patient Details Name: Melanie Lucas MRN: [id number] DOB: 1950/11/08   Cancelled Treatment:    Reason Eval/Treat Not Completed: Patient at procedure or test/ unavailable;Other (comment). Attempted multiple times to see pt today, however pt unavailable due to testing, eating lunch or with MD  Britt Bottom 06/24/2017, 2:44 PM

## 2017-06-24 NOTE — Progress Notes (Signed)
Rehab Admissions Coordinator Note:  Patient was screened by Cleatrice Burke for appropriateness for an Inpatient Acute Rehab Consult per PT recommendation.  At this time, we are recommending await further medical workup with diagnosis.. I will follow.  Cleatrice Burke 06/24/2017, 10:46 AM  I can be reached at [phone].

## 2017-06-27 ENCOUNTER — Other Ambulatory Visit: Payer: Self-pay | Admitting: *Deleted

## 2017-06-27 ENCOUNTER — Telehealth: Payer: Self-pay | Admitting: *Deleted

## 2017-06-27 NOTE — Progress Notes (Signed)
Discussed with patient possible days for surgery. Patient is on a cruise and going to be out of town from 07-04-17 to 07-16-17. Instructed patient to be at [facility] admitting department at 7 am on 07/29/17 for surgery. NPO past MN night prior. Expect a call and follow the detailed instructions received from the Hospital pre-admission department about this surgery. Instructed to continue ASA and Hold Eliquis for 3 days pre-op. Verbalized understanding.

## 2017-06-27 NOTE — Telephone Encounter (Signed)
As requested by Dr. Donnetta Hutching I called the patient's her husband instructed me to leave a message, they have Dr. Luther Parody number. To this office and ask to speak with [facility]  when ready to schedule  Surgery or desire appt.  with Dr. Donnetta Hutching.

## 2017-07-01 ENCOUNTER — Telehealth: Payer: Self-pay | Admitting: Pharmacist

## 2017-07-01 NOTE — Telephone Encounter (Signed)
Pt takes Eliquis for afib with CHADS2VASc score of 7 (age, sex, HTN, CAD, DM, stroke). Renal function is normal. With elevated cardiac history including stroke, typically prefer to hold Eliquis no longer than 24 hours. Will route to Dr Rayann Heman for input regarding duration of holding Eliquis prior to procedure since request is for > 24 hours.

## 2017-07-01 NOTE — Telephone Encounter (Signed)
Thompson Grayer, MD  [place], [place], [facility]      Previous Messages    ----- Message -----  From: Willy Eddy, RN  Sent: 06/27/2017  2:28 PM  To: Thompson Grayer, MD  Subject: Medication Clearance for surgery         Patient is scheduled for Left Carotid Endarterectomy with Dr. Sherren Mocha Early for 07/29/17. Will need to hold Eliquis for 3 days pre-op. Patient to remain on ASA. Requesting clearance. Thank you for your time. Becky RN

## 2017-07-03 ENCOUNTER — Telehealth: Payer: Self-pay | Admitting: Neurology

## 2017-07-03 NOTE — Telephone Encounter (Signed)
I agree.  Would not recommend more prolonged duration unless bridged with parenteral therapy.  OK to proceed from CV standpoint with surgery if medically indicated.

## 2017-07-03 NOTE — Telephone Encounter (Signed)
Was going to call to see if patient switched from Eliquis to Pradaxa and if interested in trying Primidone. Note from two days ago states patient still on Eliquis. Will let patient contact us if she decides to switch medications in the future.

## 2017-07-04 NOTE — Telephone Encounter (Signed)
Recommendation to hold Eliquis for only 24 hours prior to procedure.

## 2017-07-04 NOTE — Telephone Encounter (Signed)
   Primary Cardiologist: Thompson Grayer, MD  Chart reviewed as part of pre-operative protocol coverage. Patient was contacted 07/04/2017 in reference to pre-operative risk assessment for pending surgery as outlined below.  Melanie Lucas has been medically cleared for surgery by Dr. Rayann Heman on 07/03/17.   Per Dr. Rayann Heman and pharmacy, recommend holding eliquis for only 24 hr prior to procedure. If longer duration off anticoagulation is needed, she would need to be bridged with parenteral therapy. If this is needed, please contact our office to setup up bridge instructions. The patient should remain on ASA.  Therefore, based on ACC/AHA guidelines, the patient would be at acceptable risk for the planned procedure without further cardiovascular testing.   I will route this recommendation to the requesting party via Epic fax function and remove from pre-op pool.  Please call with questions.  [place], PA 07/04/2017, 8:17 AM

## 2017-07-09 ENCOUNTER — Telehealth: Payer: Self-pay | Admitting: *Deleted

## 2017-07-09 NOTE — Telephone Encounter (Signed)
Daune Perch, NP  P Cv Div Preop Pharm         Per Dr Donnetta Hutching, Eliquis must be held for 3 days pre-op for her surgery on 07/29/17. Please provide patient with Lovenox bridging for this surgery. Contact me if any further questions. Thank you, Archivist

## 2017-07-09 NOTE — Telephone Encounter (Signed)
Scheduled pt to coordinate Eliquis to Lovenox bridge on 6/6 prior to procedure 6/10. Last dose of Eliquis will be 3 days prior to procedure.

## 2017-07-09 NOTE — Telephone Encounter (Signed)
-----   Message from Willy Eddy, RN sent at 07/09/2017  1:13 PM EDT ----- Regarding: Lovenox bridge /Eliquis hold for surgery. Per Dr Donnetta Hutching, Eliquis must be held for 3 days pre-op for her surgery on 07/29/17. Please provide patient with Lovenox bridging for this surgery. Contact me if any further questions. Thank you, Archivist

## 2017-07-18 ENCOUNTER — Telehealth: Payer: Self-pay | Admitting: Internal Medicine

## 2017-07-18 NOTE — Telephone Encounter (Signed)
From an EP standpoint, I do not have an indication for her to be on ASA.  Dr Francesca Oman note in April states "eliquis, no ASA".   More recently however, she had stroke despite eliquis.  Dr Phoebe Sharps note from 06/24/17 says "recommend continue eliquis and add aspirin 81mg  daily".  It appears for now, she should stay on ASA.  I will defer duration to Dr Erlinda Hong.

## 2017-07-18 NOTE — Telephone Encounter (Signed)
Pt calling and stated her neurologist wanted her to take an aspirin daily. Pt want to know would that be alright since she is taking other medications. Please call pt

## 2017-07-19 NOTE — Telephone Encounter (Signed)
CVM left per DPR.  Advised per Dr. Rayann Heman, she should follow instructions by Dr. Erlinda Hong for recent stroke.   (Take Eliquis and ASA).  Advised she should continue ASA until Dr. Erlinda Hong advises otherwise.  Left this nurse name and # for call back if any further questions.

## 2017-07-21 NOTE — Telephone Encounter (Signed)
Pt has afib on eliquis, but also had recent TIA and found to have carotid stenosis for which she will have CEA on 07/29/17 with Dr. Donnetta Hutching. With carotid stenosis and atherosclerosis, I feel ASA 81 on top of eliquis is needed probably lifelong unless contraindicated.   Katrina, please let the pt know that she should take ASA 81mg  on top of her eliquis due to her carotid artery stenosis and atherosclerosis. Thanks.   Rosalin Hawking, MD PhD Stroke Neurology 07/21/2017 4:47 PM

## 2017-07-22 NOTE — Pre-Procedure Instructions (Addendum)
LEGACY CARRENDER  07/22/2017      [id number] [place], [place] - [address] [place] [address] Phone: [phone] Fax: [phone]    Your procedure is scheduled on June 10  Report to [facility] [email or web address].  Call this number if you have problems the morning of surgery:  [phone]   Remember:  NOTHING TO EAT OR DRINK AFTER MIDNIGHT    Take these medicines the morning of surgery with A SIP OF WATER  albuterol (PROVENTIL HFA;VENTOLIN HFA) diazepam (VALIUM) dofetilide (TIKOSYN DULoxetine (CYMBALTA)  VALTREX ASPIRIN  7 days prior to surgery STOP taking any Aspirin(unless otherwise instructed by your surgeon), Aleve, Naproxen, Ibuprofen, Motrin, Advil, Goody's, BC's, all herbal medications, fish oil, and all vitamins  Follow your doctors instructions regarding your Aspirin.  If no instructions were given by your doctor, then you will need to call the prescribing office office to get instructions.    FOLLOW PHYSICIANS INSTRUCTIONS ABOUT ELIQUIS   WHAT DO I DO ABOUT MY DIABETES MEDICATION?   [name] Do not take oral diabetes medicines (pills) the morning of surgery. Empagliflozin-metFORMIN HCl (SYNJARDY)   How to Manage Your Diabetes Before and After Surgery  Why is it important to control my blood sugar before and after surgery? . Improving blood sugar levels before and after surgery helps healing and can limit problems. . A way of improving blood sugar control is eating a healthy diet by: o  Eating less sugar and carbohydrates o  Increasing activity/exercise o  Talking with your doctor about reaching your blood sugar goals . High blood sugars (greater than 180 mg/dL) can raise your risk of infections and slow your recovery, so you will need to focus on controlling your diabetes during the weeks before surgery. . Make sure that the doctor who takes care of your diabetes knows about your planned surgery including  the date and location.  How do I manage my blood sugar before surgery? . Check your blood sugar at least 4 times a day, starting 2 days before surgery, to make sure that the level is not too high or low. o Check your blood sugar the morning of your surgery when you wake up and every 2 hours until you get to the Short Stay unit. . If your blood sugar is less than 70 mg/dL, you will need to treat for low blood sugar: o Do not take insulin. o Treat a low blood sugar (less than 70 mg/dL) with  cup of clear juice (cranberry or apple), 4 glucose tablets, OR glucose gel. o Recheck blood sugar in 15 minutes after treatment (to make sure it is greater than 70 mg/dL). If your blood sugar is not greater than 70 mg/dL on recheck, call [phone] for further instructions. . Report your blood sugar to the short stay nurse when you get to Short Stay.  . If you are admitted to the hospital after surgery: o Your blood sugar will be checked by the staff and you will probably be given insulin after surgery (instead of oral diabetes medicines) to make sure you have good blood sugar levels. o The goal for blood sugar control after surgery is 80-180 mg/dL     Do not wear jewelry, make-up or nail polish.  Do not wear lotions, powders, or perfumes, or deodorant.  Do not shave 48 hours prior to surgery.   Do not bring valuables to the hospital.  [facility] is  not responsible for any belongings or valuables.  Contacts, dentures or bridgework may not be worn into surgery.  Leave your suitcase in the car.  After surgery it may be brought to your room.  For patients admitted to the hospital, discharge time will be determined by your treatment team.  Patients discharged the day of surgery will not be allowed to drive home.    Special instructions:  [facility]- Preparing For Surgery  Before surgery, you can play an important role. Because skin is not sterile, your skin needs to be as free of germs as possible.  You can reduce the number of germs on your skin by washing with CHG (chlorahexidine gluconate) Soap before surgery.  CHG is an antiseptic cleaner which kills germs and bonds with the skin to continue killing germs even after washing.    Oral Hygiene is also important to reduce your risk of infection.  Remember - BRUSH YOUR TEETH THE MORNING OF SURGERY WITH YOUR REGULAR TOOTHPASTE  Please do not use if you have an allergy to CHG or antibacterial soaps. If your skin becomes reddened/irritated stop using the CHG.  Do not shave (including legs and underarms) for at least 48 hours prior to first CHG shower. It is OK to shave your face.  Please follow these instructions carefully.   1. Shower the NIGHT BEFORE SURGERY and the MORNING OF SURGERY with CHG.   2. If you chose to wash your hair, wash your hair first as usual with your normal shampoo.  3. After you shampoo, rinse your hair and body thoroughly to remove the shampoo.  4. Use CHG as you would any other liquid soap. You can apply CHG directly to the skin and wash gently with a scrungie or a clean washcloth.   5. Apply the CHG Soap to your body ONLY FROM THE NECK DOWN.  Do not use on open wounds or open sores. Avoid contact with your eyes, ears, mouth and genitals (private parts). Wash Face and genitals (private parts)  with your normal soap.  6. Wash thoroughly, paying special attention to the area where your surgery will be performed.  7. Thoroughly rinse your body with warm water from the neck down.  8. DO NOT shower/wash with your normal soap after using and rinsing off the CHG Soap.  9. Pat yourself dry with a CLEAN TOWEL.  10. Wear CLEAN PAJAMAS to bed the night before surgery, wear comfortable clothes the morning of surgery  11. Place CLEAN SHEETS on your bed the night of your first shower and DO NOT SLEEP WITH PETS.    Day of Surgery:  Do not apply any deodorants/lotions.  Please wear clean clothes to the hospital/surgery  center.   Remember to brush your teeth WITH YOUR REGULAR TOOTHPASTE.    Please read over the following fact sheets that you were given.

## 2017-07-22 NOTE — Telephone Encounter (Signed)
LEft vm for patient to call back that she should continue aspirin per Dr. Erlinda Hong. Pt  Is to continue seeing Dr. Carles Collet for her neurology follow up per Dr. Erlinda Hong hospital note.

## 2017-07-23 ENCOUNTER — Encounter (HOSPITAL_COMMUNITY)
Admission: RE | Admit: 2017-07-23 | Discharge: 2017-07-23 | Disposition: A | Discharge status: Home / Self Care | Payer: Medicare Other | Source: Ambulatory Visit | Attending: Vascular Surgery | Admitting: Vascular Surgery

## 2017-07-23 ENCOUNTER — Encounter (HOSPITAL_COMMUNITY): Payer: Self-pay

## 2017-07-23 ENCOUNTER — Other Ambulatory Visit: Payer: Self-pay

## 2017-07-23 DIAGNOSIS — Z79899 Other long term (current) drug therapy: Secondary | ICD-10-CM | POA: Insufficient documentation

## 2017-07-23 DIAGNOSIS — I6522 Occlusion and stenosis of left carotid artery: Secondary | ICD-10-CM | POA: Insufficient documentation

## 2017-07-23 DIAGNOSIS — Z01818 Encounter for other preprocedural examination: Secondary | ICD-10-CM | POA: Insufficient documentation

## 2017-07-23 DIAGNOSIS — Z7901 Long term (current) use of anticoagulants: Secondary | ICD-10-CM | POA: Diagnosis not present

## 2017-07-23 DIAGNOSIS — Z7982 Long term (current) use of aspirin: Secondary | ICD-10-CM | POA: Diagnosis not present

## 2017-07-23 HISTORY — DX: Anxiety disorder, unspecified: F41.9

## 2017-07-23 HISTORY — DX: Occlusion and stenosis of left carotid artery: I65.22

## 2017-07-23 HISTORY — DX: Complete loss of teeth, unspecified cause, unspecified class: K08.109

## 2017-07-23 HISTORY — DX: Complete loss of teeth, unspecified cause, unspecified class: Z97.2

## 2017-07-23 HISTORY — DX: Type 2 diabetes mellitus with diabetic neuropathy, unspecified: E11.40

## 2017-07-23 HISTORY — DX: Unspecified urinary incontinence: R32

## 2017-07-23 HISTORY — DX: Unspecified hearing loss, right ear: H91.91

## 2017-07-23 LAB — TYPE AND SCREEN
ABO/RH(D): O POS
Antibody Screen: NEGATIVE

## 2017-07-23 LAB — COMPREHENSIVE METABOLIC PANEL
ALK PHOS: 74 U/L (ref 38–126)
ALT: 15 U/L (ref 14–54)
AST: 16 U/L (ref 15–41)
Albumin: 3.5 g/dL (ref 3.5–5.0)
Anion gap: 10 (ref 5–15)
BUN: 13 mg/dL (ref 6–20)
CALCIUM: 9.7 mg/dL (ref 8.9–10.3)
CO2: 29 mmol/L (ref 22–32)
CREATININE: 0.69 mg/dL (ref 0.44–1.00)
Chloride: 103 mmol/L (ref 101–111)
Glucose, Bld: 264 mg/dL — ABNORMAL HIGH (ref 65–99)
Potassium: 3.5 mmol/L (ref 3.5–5.1)
Sodium: 142 mmol/L (ref 135–145)
Total Bilirubin: 0.9 mg/dL (ref 0.3–1.2)
Total Protein: 6.1 g/dL — ABNORMAL LOW (ref 6.5–8.1)

## 2017-07-23 LAB — GLUCOSE, CAPILLARY: Glucose-Capillary: 306 mg/dL — ABNORMAL HIGH (ref 65–99)

## 2017-07-23 LAB — CBC
HEMATOCRIT: 50.2 % — AB (ref 36.0–46.0)
HEMOGLOBIN: 16.7 g/dL — AB (ref 12.0–15.0)
MCH: 30.6 pg (ref 26.0–34.0)
MCHC: 33.3 g/dL (ref 30.0–36.0)
MCV: 92.1 fL (ref 78.0–100.0)
Platelets: 186 10*3/uL (ref 150–400)
RBC: 5.45 MIL/uL — AB (ref 3.87–5.11)
RDW: 12.5 % (ref 11.5–15.5)
WBC: 6.7 10*3/uL (ref 4.0–10.5)

## 2017-07-23 LAB — URINALYSIS, ROUTINE W REFLEX MICROSCOPIC
Bacteria, UA: NONE SEEN
Bilirubin Urine: NEGATIVE
Glucose, UA: >=500 mg/dL — AB
HGB URINE DIPSTICK: NEGATIVE
KETONES UR: NEGATIVE mg/dL
LEUKOCYTES UA: NEGATIVE
Nitrite: NEGATIVE
PROTEIN: NEGATIVE mg/dL
Specific Gravity, Urine: 1.035 — ABNORMAL HIGH (ref 1.005–1.030)
pH: 5 (ref 5.0–8.0)

## 2017-07-23 LAB — ABO/RH: ABO/RH(D): O POS

## 2017-07-23 LAB — SURGICAL PCR SCREEN
MRSA, PCR: NEGATIVE
Staphylococcus aureus: NEGATIVE

## 2017-07-23 LAB — PROTIME-INR
INR: 1.06
PROTHROMBIN TIME: 13.7 s (ref 11.4–15.2)

## 2017-07-23 LAB — APTT: aPTT: 32 seconds (ref 24–36)

## 2017-07-23 NOTE — Telephone Encounter (Signed)
Left 2nd vm for patient to call back about to continue taking aspirin with eliquis per Dr. Erlinda Hong. She is to continue the aspirin because of carotid stenosis.

## 2017-07-23 NOTE — Progress Notes (Signed)
Pt denies any acute cardiopulmonary issues. Pt under the care of Dr. Rayann Heman, Cardiology. Pt stated that a stress test was performed in 2014 but she can't recall the name and location of the practice or the ordering physician. Pt stated that she was instructed to stop taking Empagliflozin-metFORMIN HCl [facility]) on June 8 and  Eliquis 3 days prior to surgery; pt stated that she will have a Lovenox bridge and was instructed to start taking Aspirin. Pt chart forwarded to anesthesia for review of cardiac history and abnormal labs (pt stated that she ate and had a piece of candy prior to PAT appointment ).

## 2017-07-23 NOTE — Pre-Procedure Instructions (Signed)
Melanie Lucas  07/23/2017      [id number] [place], [place] [address] Phone: [phone] Fax: [phone]    Your procedure is scheduled on Monday, July 29, 2017  Report to [facility] Admitting at 5:30 A.M.  Call this number if you have problems the morning of surgery:  [phone]   Remember:  NOTHING TO EAT OR DRINK AFTER MIDNIGHT Sunday, July 28, 2017    Take these medicines the morning of surgery with A SIP OF WATER: diazepam (VALIUM),  dofetilide (TIKOSYN),  DULoxetine (CYMBALTA) , ASPIRIN  If needed: VALTREX for outbreaks,  albuterol (PROVENTIL HFA;VENTOLIN HFA)  Inhaler for shortness of breath or wheezing  ( Bring inhaler in with you on day of surgery). 7 days prior to surgery STOP taking  Aleve, Naproxen, Ibuprofen, Motrin, Advil, Goody's, BC's, all herbal medications, fish oil, Phenylephrine-Acetaminophen (SINUS HEADACHE PE MAX ) and all vitamins.  Follow your doctors instructions regarding your Aspirin.  If no instructions were given by your doctor, then you will need to call the prescribing office office to get instructions.    FOLLOW PHYSICIANS INSTRUCTIONS ABOUT ELIQUIS  WHAT DO I DO ABOUT MY DIABETES MEDICATION?  [name] Do not take oral diabetes medicines (pills) the morning of surgery. Empagliflozin-metFORMIN HCl (SYNJARDY)   How to Manage Your Diabetes Before and After Surgery  Why is it important to control my blood sugar before and after surgery? . Improving blood sugar levels before and after surgery helps healing and can limit problems. . A way of improving blood sugar control is eating a healthy diet by: o  Eating less sugar and carbohydrates o  Increasing activity/exercise o  Talking with your doctor about reaching your blood sugar goals . High blood sugars (greater than 180 mg/dL) can raise your risk of infections and slow your recovery, so you will need to focus on controlling  your diabetes during the weeks before surgery. . Make sure that the doctor who takes care of your diabetes knows about your planned surgery including the date and location.  How do I manage my blood sugar before surgery? . Check your blood sugar at least 4 times a day, starting 2 days before surgery, to make sure that the level is not too high or low. o Check your blood sugar the morning of your surgery when you wake up and every 2 hours until you get to the Short Stay unit. . If your blood sugar is less than 70 mg/dL, you will need to treat for low blood sugar: o Do not take insulin. o Treat a low blood sugar (less than 70 mg/dL) with  cup of clear juice (cranberry or apple), 4 glucose tablets, OR glucose gel. o Recheck blood sugar in 15 minutes after treatment (to make sure it is greater than 70 mg/dL). If your blood sugar is not greater than 70 mg/dL on recheck, call [phone] for further instructions. . Report your blood sugar to the short stay nurse when you get to Short Stay.  . If you are admitted to the hospital after surgery: o Your blood sugar will be checked by the staff and you will probably be given insulin after surgery (instead of oral diabetes medicines) to make sure you have good blood sugar levels. o The goal for blood sugar control after surgery is 80-180 mg/dL    Do not wear jewelry, make-up or nail polish.  Do not wear  lotions, powders, or perfumes, or deodorant.  Do not shave 48 hours prior to surgery.   Do not bring valuables to the hospital.  [facility] is not responsible for any belongings or valuables.  Contacts, dentures or bridgework may not be worn into surgery.  Leave your suitcase in the car.  After surgery it may be brought to your room.  Special instructions:  [facility]- Preparing For Surgery  Before surgery, you can play an important role. Because skin is not sterile, your skin needs to be as free of germs as possible. You can reduce the number of  germs on your skin by washing with CHG (chlorahexidine gluconate) Soap before surgery.  CHG is an antiseptic cleaner which kills germs and bonds with the skin to continue killing germs even after washing.    Oral Hygiene is also important to reduce your risk of infection.  Remember - BRUSH YOUR TEETH THE MORNING OF SURGERY WITH YOUR REGULAR TOOTHPASTE  Please do not use if you have an allergy to CHG or antibacterial soaps. If your skin becomes reddened/irritated stop using the CHG.  Do not shave (including legs and underarms) for at least 48 hours prior to first CHG shower. It is OK to shave your face.  Please follow these instructions carefully.   1. Shower the NIGHT BEFORE SURGERY and the MORNING OF SURGERY with CHG.   2. If you chose to wash your hair, wash your hair first as usual with your normal shampoo.  3. After you shampoo, rinse your hair and body thoroughly to remove the shampoo.  4. Use CHG as you would any other liquid soap. You can apply CHG directly to the skin and wash gently with a scrungie or a clean washcloth.   5. Apply the CHG Soap to your body ONLY FROM THE NECK DOWN.  Do not use on open wounds or open sores. Avoid contact with your eyes, ears, mouth and genitals (private parts). Wash Face and genitals (private parts)  with your normal soap.  6. Wash thoroughly, paying special attention to the area where your surgery will be performed.  7. Thoroughly rinse your body with warm water from the neck down.  8. DO NOT shower/wash with your normal soap after using and rinsing off the CHG Soap.  9. Pat yourself dry with a CLEAN TOWEL.  10. Wear CLEAN PAJAMAS to bed the night before surgery, wear comfortable clothes the morning of surgery  11. Place CLEAN SHEETS on your bed the night of your first shower and DO NOT SLEEP WITH PETS.  Day of Surgery:  Do not apply any deodorants/lotions.  Please wear clean clothes to the hospital/surgery center.   Remember to brush your  teeth WITH YOUR REGULAR TOOTHPASTE.  Please read over the following fact sheets that you were given.

## 2017-07-24 NOTE — Progress Notes (Signed)
Anesthesia Chart Review:   Case:  [id number] Date/Time:  07/29/17 0715   Procedure:  ENDARTERECTOMY CAROTID LEFT (Left )   Anesthesia type:  General   Pre-op diagnosis:  LEFT CAROTID STENOSIS   Location:  [facility] OR ROOM 12 / [place] [facility]   Surgeon:  Rosetta Posner, MD      DISCUSSION: - Pt is a 67 year old female with hx CAD (BMS to D1 2015), afib, TIA x3 (most recent was 06/24/17), DM  - Pt to hold eliquis 3 days prior to surgery with lovenox bridge, and continue ASA perioperatively   - Hospitalized 5/5-06/24/17 for TIA   VS: BP (!) 145/63   Pulse (!) 52   Temp 36.7 C   Resp 20   Ht 5\' 6"  (1.676 m)   Wt 161 lb 6.4 oz (73.2 kg)   SpO2 100%   BMI 26.05 kg/m    PROVIDERS: PCP is Via, Lennette Bihari, MD   Cardiologist is Ena Dawley, MD. Last office visit 05/28/17 EP cardiologist is Thompson Grayer, MD.  Last office visit 11/26/16. Cleared pt for surgery 07/03/17 Neurologist is Rosalin Hawking, MD   LABS:  - Glucose 264 - HbA1c was 8.5 on 06/24/17  (all labs ordered are listed, but only abnormal results are displayed)  Labs Reviewed  GLUCOSE, CAPILLARY - Abnormal; Notable for the following components:      Result Value   Glucose-Capillary 306 (*)    All other components within normal limits  CBC - Abnormal; Notable for the following components:   RBC 5.45 (*)    Hemoglobin 16.7 (*)    HCT 50.2 (*)    All other components within normal limits  COMPREHENSIVE METABOLIC PANEL - Abnormal; Notable for the following components:   Glucose, Bld 264 (*)    Total Protein 6.1 (*)    All other components within normal limits  URINALYSIS, ROUTINE W REFLEX MICROSCOPIC - Abnormal; Notable for the following components:   Specific Gravity, Urine 1.035 (*)    Glucose, UA >=500 (*)    All other components within normal limits  SURGICAL PCR SCREEN  APTT  PROTIME-INR  TYPE AND SCREEN  ABO/RH     IMAGES:  CXR 06/23/17: No acute cardiopulmonary process.  CT angio head, neck 06/23/17:  1. Negative CTA for  emergent large vessel occlusion. 2. Atheromatous plaque about the carotid bifurcations with associated stenoses of up to 50-60% by NASCET criteria, right slightly worse than left. 3. Moderate atherosclerotic change throughout the carotid siphons without high-grade stenosis. No proximal high-grade or correctable stenosis identified.   EKG 06/23/17: Sinus rhythm. PACs. LAFB. RSR' in V1 or V2, probably normal variant. LVH with secondary repolarization abnormality. Anterior Q waves, possibly due to LVH   CV:  Echo 06/24/17:  - Left ventricle: The cavity size was normal. Wall thickness was increased in a pattern of mild LVH. Systolic function was normal. The estimated ejection fraction was in the range of 50% to 55%. Wall motion was normal; there were no regional wall motion abnormalities. Doppler parameters are consistent with abnormal left ventricular relaxation (grade 1 diastolic dysfunction). - Mitral valve: There was trivial regurgitation. - Left atrium: The atrium was moderately dilated. - Tricuspid valve: There was mild regurgitation. - Pulmonary arteries: Systolic pressure was mildly increased. PA peak pressure: 32 mm Hg (S).  Cardiac cath 02/27/13:  1. D1 with proximal 95% tubular lesion. Diffuse luminal irregularities proximal D1. S/p PTCA/stent to D1    Past Medical History:  Diagnosis Date  .  Anxiety   . Cancer (HCC)    Skin- leg   . Chronic acquired lymphedema    a. R>L;  b. 03/2012 Neg LE U/S for DVT. Legs since age 81  . Complication of anesthesia    woke up during colonoscopy, Lithrostrixpy, Biospy  . COPD (chronic obstructive pulmonary disease) ([place])   . Coronary artery disease   . CVD (cardiovascular disease)   . Deaf, right   . Diabetes mellitus    Type II  . Diabetic neuropathy ([place])   . Dysrhythmia    afib  . Elevated TSH    a. 10/2012 - inst to f/u PCP.  . Full dentures   . Hematuria    a. while on pradaxa,  she reports that she has seen Dr Margie Ege and had low risk  cystoscopy  . History of kidney stones   . Hyperlipidemia   . Hypertension    patient denies  . Incontinence    prior to urinating  . Lacunar infarction ([place])    a. 02/2009 non-acute Lacunar infarct of the right thalamus noted on MRI of brain.  . Left carotid stenosis   . Lymphedema of extremity    had this problem, since she was a teenager. especially seen in R [place]  . Meniere disease   . Myocardial infarction ([place])   . Obesity   . Osteoarthritis    cervical & lumbar region, knees, hands cramp also   . PAF with post-termination pauses    a. on dronedarone;  b. CHA2DS2VASc = 5 (HTN, DM, h/o lacunar infarct on MRI, Female) ->refused oral anticoagulation after h/o hematuria on pradaxa;  c. 02/2012 Echo: EF 55-60%, mildly dil LA. D. Recurrent PAF 10/2012 after only taking Multaq 1x/day - spont conv to NSR, placed back on BID Multaq/eliquis;  e. 10/2014 Multaq d/c'd->tikosyn initiated.  . Shortness of breath dyspnea    with exertion  . Stroke [facility])    2 mini strokes  . Tobacco abuse     Past Surgical History:  Procedure Laterality Date  . ANTERIOR CERVICAL CORPECTOMY N/A 02/21/2016   Procedure: ANTERIOR CERVICAL CORPECTOMY ANS FUSION CERVICAL SIX , ANTERIOR PLATING CERVICAL FIVE-SEVEN;  Surgeon: Consuella Lose, MD;  Location: [facility];  Service: Neurosurgery;  Laterality: N/A;  . ANTERIOR FUSION CERVICAL SPINE  02/21/2016  . Biopsy of adrenal glands    . BREAST BIOPSY Bilateral    benign results, both breasts   . CARDIAC CATHETERIZATION  2014   x1 stent placed, done in [place]   . CARDIOVERSION N/A 11/11/2014   Procedure: CARDIOVERSION;  Surgeon: Larey Dresser, MD;  Location: [place];  Service: Cardiovascular;  Laterality: N/A;  . COLONOSCOPY W/ POLYPECTOMY    . CORONARY STENT PLACEMENT    . TEE WITHOUT CARDIOVERSION N/A 11/11/2014   Procedure: TRANSESOPHAGEAL ECHOCARDIOGRAM (TEE);  Surgeon: Larey Dresser, MD;  Location[facility] ENDOSCOPY;  Service: Cardiovascular;  Laterality:  N/A;    MEDICATIONS: . albuterol (PROVENTIL HFA;VENTOLIN HFA) 108 (90 Base) MCG/ACT inhaler  . aspirin EC 81 MG tablet  . diazepam (VALIUM) 2 MG tablet  . dofetilide (TIKOSYN) 500 MCG capsule  . DULoxetine (CYMBALTA) 60 MG capsule  . ELIQUIS 5 MG TABS tablet  . Empagliflozin-metFORMIN HCl (SYNJARDY) 12.06-998 MG TABS  . furosemide (LASIX) 20 MG tablet  . ibuprofen (ADVIL,MOTRIN) 800 MG tablet  . Multiple Vitamin (MULTIVITAMIN) tablet  . nicotine (NICODERM CQ - DOSED IN MG/24 HOURS) 21 mg/24hr patch  . Phenylephrine-Acetaminophen (SINUS HEADACHE PE MAX ST) 5-325  MG TABS  . rosuvastatin (CRESTOR) 5 MG tablet  . VALTREX 500 MG tablet  . zoledronic acid (RECLAST) 5 MG/100ML SOLN injection   No current facility-administered medications for this encounter.    - Pt to hold eliquis 3 days prior to surgery with lovenox bridge, and continue ASA perioperatively    If labs acceptable day of surgery, I anticipate pt can proceed with surgery as scheduled.   Willeen Cass, FNP-BC [facility]/Anesthesiology Phone: [phone] 07/24/2017 11:35 AM

## 2017-07-25 ENCOUNTER — Ambulatory Visit (INDEPENDENT_AMBULATORY_CARE_PROVIDER_SITE_OTHER): Payer: Medicare Other | Admitting: Pharmacist

## 2017-07-25 DIAGNOSIS — G459 Transient cerebral ischemic attack, unspecified: Secondary | ICD-10-CM | POA: Diagnosis not present

## 2017-07-25 MED ORDER — ENOXAPARIN SODIUM 80 MG/0.8ML ~~LOC~~ SOLN: 80.0000 mg | Freq: Two times a day (BID) | SUBCUTANEOUS | 0 refills | Status: DC

## 2017-07-25 NOTE — Patient Instructions (Signed)
Endarterectomy on 07/29/17   Weight = 74kg  PLTs = 186 Est CrCl = 3ml/min   07/25/2017: Last dose of Eliquis.  07/26/2017: Inject Lovenox (enoxaparin) 80mg  in the fatty abdominal tissue at least 2 inches from the belly button every 12 hours . No Eliquis  07/27/2017: Inject Lovenox in the fatty tissue every 12 hours. No Eliquis.  07/28/2017: Inject Lovenox in the fatty tissue MORNING only (no PM dose). No Eliquis.  07/29/17: Procedure Day - No Lovenox - Resume Eliquis  as directed by doctor    Lashawnta Burgert Rodriguez-Guzman PharmD, BCPS, [place] [id number] 07/25/2017 2:22 PM

## 2017-07-25 NOTE — Progress Notes (Signed)
Endarterectomy on 07/29/17 . Need to HOLD Eliquis 3 days prior to surgery.  Surgeon requesting bridging therapy prior to surgery. Patient is to remain on Aspirin per surgeon request.   Weight = 74kg  PLTs = 186 Est CrCl = 12ml/min   07/25/2017: Last dose of Eliquis.  07/26/2017: Inject Lovenox (enoxaparin) 80mg  in the fatty abdominal tissue at least 2 inches from the belly button every 12 hours . No Eliquis  07/27/2017: Inject Lovenox in the fatty tissue every 12 hours. No Eliquis.  07/28/2017: Inject Lovenox in the fatty tissue MORNING only (no PM dose). No Eliquis.  07/29/17: Procedure Day - No Lovenox - Resume Eliquis  as directed by doctor    Bjorn Hallas Rodriguez-Guzman PharmD, BCPS, [place] [employer] [id number] 07/25/2017 2:22 PM

## 2017-07-25 NOTE — Telephone Encounter (Signed)
RN call patient that per Dr. Erlinda Hong he wants her to continue aspirin because of her carotid stenosis. Rn stated per Dr.Xu he wants her to follow up with Dr. Carles Collet at [facility] neurology as schedule. Rn stated she is to continue the aspirin,and eliquis ongoing. Pt verbalized understanding.

## 2017-07-26 ENCOUNTER — Encounter (HOSPITAL_COMMUNITY): Payer: Self-pay | Admitting: Anesthesiology

## 2017-07-28 NOTE — Anesthesia Preprocedure Evaluation (Addendum)
Anesthesia Evaluation  Patient identified by MRN, date of birth, ID band Patient awake    Reviewed: Allergy & Precautions, NPO status , Patient's Chart, lab work & pertinent test results  Airway Mallampati: I   Neck ROM: Full    Dental  (+) Edentulous Upper, Edentulous Lower   Pulmonary shortness of breath, COPD, Current Smoker,    breath sounds clear to auscultation       Cardiovascular hypertension, + CAD and + Peripheral Vascular Disease  + dysrhythmias Atrial Fibrillation  Rhythm:Regular Rate:Normal  Echo noted   Neuro/Psych Cerv myelopathy and cervical fusion hx TIACVA    GI/Hepatic negative GI ROS, Neg liver ROS,   Endo/Other  diabetes  Renal/GU      Musculoskeletal  (+) Arthritis ,   Abdominal   Peds  Hematology negative hematology ROS (+)   Anesthesia Other Findings   Reproductive/Obstetrics                            Anesthesia Physical Anesthesia Plan  ASA: III  Anesthesia Plan: General   Post-op Pain Management:    Induction: Intravenous  PONV Risk Score and Plan: 3 and Ondansetron and Dexamethasone  Airway Management Planned: Oral ETT  Additional Equipment: Arterial line  Intra-op Plan:   Post-operative Plan: Extubation in OR  Informed Consent: I have reviewed the patients History and Physical, chart, labs and discussed the procedure including the risks, benefits and alternatives for the proposed anesthesia with the patient or authorized representative who has indicated his/her understanding and acceptance.   Dental advisory given  Plan Discussed with:   Anesthesia Plan Comments:         Anesthesia Quick Evaluation

## 2017-07-29 ENCOUNTER — Encounter (HOSPITAL_COMMUNITY)
Admission: RE | Disposition: A | Discharge status: Home / Self Care | Payer: Self-pay | Source: Ambulatory Visit | Attending: Vascular Surgery

## 2017-07-29 ENCOUNTER — Inpatient Hospital Stay (HOSPITAL_COMMUNITY)
Admission: RE | Admit: 2017-07-29 | Discharge: 2017-07-30 | DRG: 039 | Disposition: A | Discharge status: Home / Self Care | Payer: Medicare Other | Source: Ambulatory Visit | Attending: Vascular Surgery | Admitting: Vascular Surgery

## 2017-07-29 ENCOUNTER — Other Ambulatory Visit: Payer: Self-pay

## 2017-07-29 ENCOUNTER — Encounter (HOSPITAL_COMMUNITY): Payer: Self-pay | Admitting: *Deleted

## 2017-07-29 ENCOUNTER — Inpatient Hospital Stay (HOSPITAL_COMMUNITY): Payer: Medicare Other | Admitting: Anesthesiology

## 2017-07-29 ENCOUNTER — Inpatient Hospital Stay (HOSPITAL_COMMUNITY): Payer: Medicare Other | Admitting: Emergency Medicine

## 2017-07-29 DIAGNOSIS — I69320 Aphasia following cerebral infarction: Secondary | ICD-10-CM

## 2017-07-29 DIAGNOSIS — I6522 Occlusion and stenosis of left carotid artery: Secondary | ICD-10-CM | POA: Diagnosis present

## 2017-07-29 DIAGNOSIS — Z882 Allergy status to sulfonamides status: Secondary | ICD-10-CM

## 2017-07-29 DIAGNOSIS — I480 Paroxysmal atrial fibrillation: Secondary | ICD-10-CM | POA: Diagnosis present

## 2017-07-29 DIAGNOSIS — Z888 Allergy status to other drugs, medicaments and biological substances status: Secondary | ICD-10-CM

## 2017-07-29 DIAGNOSIS — M1990 Unspecified osteoarthritis, unspecified site: Secondary | ICD-10-CM | POA: Diagnosis present

## 2017-07-29 DIAGNOSIS — Z807 Family history of other malignant neoplasms of lymphoid, hematopoietic and related tissues: Secondary | ICD-10-CM | POA: Diagnosis not present

## 2017-07-29 DIAGNOSIS — I252 Old myocardial infarction: Secondary | ICD-10-CM

## 2017-07-29 DIAGNOSIS — Z881 Allergy status to other antibiotic agents status: Secondary | ICD-10-CM

## 2017-07-29 DIAGNOSIS — E669 Obesity, unspecified: Secondary | ICD-10-CM | POA: Diagnosis present

## 2017-07-29 DIAGNOSIS — Z8249 Family history of ischemic heart disease and other diseases of the circulatory system: Secondary | ICD-10-CM | POA: Diagnosis not present

## 2017-07-29 DIAGNOSIS — I739 Peripheral vascular disease, unspecified: Secondary | ICD-10-CM | POA: Diagnosis present

## 2017-07-29 DIAGNOSIS — I2510 Atherosclerotic heart disease of native coronary artery without angina pectoris: Secondary | ICD-10-CM | POA: Diagnosis present

## 2017-07-29 DIAGNOSIS — Z7901 Long term (current) use of anticoagulants: Secondary | ICD-10-CM | POA: Diagnosis not present

## 2017-07-29 DIAGNOSIS — E785 Hyperlipidemia, unspecified: Secondary | ICD-10-CM | POA: Diagnosis present

## 2017-07-29 DIAGNOSIS — Z87442 Personal history of urinary calculi: Secondary | ICD-10-CM | POA: Diagnosis not present

## 2017-07-29 DIAGNOSIS — J449 Chronic obstructive pulmonary disease, unspecified: Secondary | ICD-10-CM | POA: Diagnosis present

## 2017-07-29 DIAGNOSIS — Z8052 Family history of malignant neoplasm of bladder: Secondary | ICD-10-CM | POA: Diagnosis not present

## 2017-07-29 DIAGNOSIS — Z91048 Other nonmedicinal substance allergy status: Secondary | ICD-10-CM

## 2017-07-29 DIAGNOSIS — H8109 Meniere's disease, unspecified ear: Secondary | ICD-10-CM | POA: Diagnosis present

## 2017-07-29 DIAGNOSIS — I890 Lymphedema, not elsewhere classified: Secondary | ICD-10-CM | POA: Diagnosis present

## 2017-07-29 DIAGNOSIS — Z803 Family history of malignant neoplasm of breast: Secondary | ICD-10-CM | POA: Diagnosis not present

## 2017-07-29 DIAGNOSIS — I482 Chronic atrial fibrillation: Secondary | ICD-10-CM | POA: Diagnosis present

## 2017-07-29 DIAGNOSIS — Z955 Presence of coronary angioplasty implant and graft: Secondary | ICD-10-CM | POA: Diagnosis not present

## 2017-07-29 DIAGNOSIS — I10 Essential (primary) hypertension: Secondary | ICD-10-CM | POA: Diagnosis present

## 2017-07-29 DIAGNOSIS — Z7982 Long term (current) use of aspirin: Secondary | ICD-10-CM

## 2017-07-29 DIAGNOSIS — Z6827 Body mass index (BMI) 27.0-27.9, adult: Secondary | ICD-10-CM

## 2017-07-29 HISTORY — PX: PATCH ANGIOPLASTY: SHX6230

## 2017-07-29 HISTORY — PX: ENDARTERECTOMY: SHX5162

## 2017-07-29 HISTORY — PX: CAROTID ENDARTERECTOMY: SUR193

## 2017-07-29 LAB — GLUCOSE, CAPILLARY
GLUCOSE-CAPILLARY: 207 mg/dL — AB (ref 65–99)
GLUCOSE-CAPILLARY: 187 mg/dL — AB (ref 65–99)
GLUCOSE-CAPILLARY: 220 mg/dL — AB (ref 65–99)
Glucose-Capillary: 217 mg/dL — ABNORMAL HIGH (ref 65–99)
Glucose-Capillary: 206 mg/dL — ABNORMAL HIGH (ref 65–99)

## 2017-07-29 LAB — BASIC METABOLIC PANEL
Anion gap: 9 (ref 5–15)
BUN: 12 mg/dL (ref 6–20)
CALCIUM: 8.5 mg/dL — AB (ref 8.9–10.3)
CO2: 27 mmol/L (ref 22–32)
CREATININE: 0.65 mg/dL (ref 0.44–1.00)
Chloride: 104 mmol/L (ref 101–111)
GFR calc Af Amer: >60 mL/min (ref >60)
GFR calc non Af Amer: >60 mL/min (ref >60)
Glucose, Bld: 233 mg/dL — ABNORMAL HIGH (ref 65–99)
Potassium: 3.7 mmol/L (ref 3.5–5.1)
Sodium: 140 mmol/L (ref 135–145)

## 2017-07-29 LAB — MAGNESIUM: Magnesium: 1.8 mg/dL (ref 1.7–2.4)

## 2017-07-29 SURGERY — ENDARTERECTOMY, CAROTID
Anesthesia: General | Site: Neck | Laterality: Left

## 2017-07-29 MED ORDER — HYDROMORPHONE HCL 2 MG/ML IJ SOLN
0.2500 mg | INTRAMUSCULAR | Status: DC | PRN
  Administered 2017-07-29: 0.5 mg via INTRAVENOUS

## 2017-07-29 MED ORDER — DEXTROSE 5 % IV SOLN
INTRAVENOUS | Status: DC | PRN
  Administered 2017-07-29: 50 ug/min via INTRAVENOUS

## 2017-07-29 MED ORDER — LIDOCAINE 2% (20 MG/ML) 5 ML SYRINGE
INTRAMUSCULAR | Status: AC
  Filled 2017-07-29: qty 5

## 2017-07-29 MED ORDER — CHLORHEXIDINE GLUCONATE 4 % EX LIQD: 60.0000 mL | Freq: Once | CUTANEOUS | Status: DC

## 2017-07-29 MED ORDER — NITROGLYCERIN IN D5W 200-5 MCG/ML-% IV SOLN
INTRAVENOUS | Status: DC | PRN
  Administered 2017-07-29: 20 ug/min via INTRAVENOUS

## 2017-07-29 MED ORDER — POLYETHYLENE GLYCOL 3350 17 G PO PACK: 17.0000 g | PACK | Freq: Every day | ORAL | Status: DC | PRN

## 2017-07-29 MED ORDER — DEXAMETHASONE SODIUM PHOSPHATE 10 MG/ML IJ SOLN
INTRAMUSCULAR | Status: AC
  Filled 2017-07-29: qty 1

## 2017-07-29 MED ORDER — DEXAMETHASONE SODIUM PHOSPHATE 10 MG/ML IJ SOLN
INTRAMUSCULAR | Status: DC | PRN
  Administered 2017-07-29: 4 mg via INTRAVENOUS

## 2017-07-29 MED ORDER — FENTANYL CITRATE (PF) 250 MCG/5ML IJ SOLN
INTRAMUSCULAR | Status: AC
  Filled 2017-07-29: qty 5

## 2017-07-29 MED ORDER — DOCUSATE SODIUM 100 MG PO CAPS
100.0000 mg | ORAL_CAPSULE | Freq: Every day | ORAL | Status: DC
  Administered 2017-07-30: 100 mg via ORAL
  Filled 2017-07-29: qty 1

## 2017-07-29 MED ORDER — VALACYCLOVIR HCL 500 MG PO TABS: 500.0000 mg | ORAL_TABLET | Freq: Two times a day (BID) | ORAL | Status: DC | PRN

## 2017-07-29 MED ORDER — DULOXETINE HCL 20 MG PO CPEP
40.0000 mg | ORAL_CAPSULE | Freq: Every day | ORAL | Status: DC
  Administered 2017-07-29 – 2017-07-30 (×2): 40 mg via ORAL
  Filled 2017-07-29 (×2): qty 2

## 2017-07-29 MED ORDER — MIDAZOLAM HCL 5 MG/5ML IJ SOLN
INTRAMUSCULAR | Status: DC | PRN
  Administered 2017-07-29: 1 mg via INTRAVENOUS

## 2017-07-29 MED ORDER — HEPARIN SODIUM (PORCINE) 1000 UNIT/ML IJ SOLN
INTRAMUSCULAR | Status: DC | PRN
  Administered 2017-07-29: 7000 [IU] via INTRAVENOUS

## 2017-07-29 MED ORDER — HYDRALAZINE HCL 20 MG/ML IJ SOLN: 5.0000 mg | INTRAMUSCULAR | Status: DC | PRN

## 2017-07-29 MED ORDER — NICOTINE 21 MG/24HR TD PT24
21.0000 mg | MEDICATED_PATCH | Freq: Every day | TRANSDERMAL | Status: DC
  Administered 2017-07-29 – 2017-07-30 (×2): 21 mg via TRANSDERMAL
  Filled 2017-07-29 (×2): qty 1

## 2017-07-29 MED ORDER — 0.9 % SODIUM CHLORIDE (POUR BTL) OPTIME
TOPICAL | Status: DC | PRN
  Administered 2017-07-29: 1000 mL

## 2017-07-29 MED ORDER — PROPOFOL 10 MG/ML IV BOLUS
INTRAVENOUS | Status: DC | PRN
  Administered 2017-07-29: 140 mg via INTRAVENOUS

## 2017-07-29 MED ORDER — SODIUM CHLORIDE 0.9 % IV SOLN: INTRAVENOUS | Status: DC

## 2017-07-29 MED ORDER — FENTANYL CITRATE (PF) 250 MCG/5ML IJ SOLN
INTRAMUSCULAR | Status: DC | PRN
  Administered 2017-07-29 (×5): 50 ug via INTRAVENOUS

## 2017-07-29 MED ORDER — SUGAMMADEX SODIUM 200 MG/2ML IV SOLN
INTRAVENOUS | Status: DC | PRN
  Administered 2017-07-29: 146 mg via INTRAVENOUS

## 2017-07-29 MED ORDER — ACETAMINOPHEN 325 MG PO TABS: 325.0000 mg | ORAL_TABLET | ORAL | Status: DC | PRN

## 2017-07-29 MED ORDER — POTASSIUM CHLORIDE CRYS ER 20 MEQ PO TBCR: 20.0000 meq | EXTENDED_RELEASE_TABLET | Freq: Every day | ORAL | Status: DC | PRN

## 2017-07-29 MED ORDER — MAGNESIUM SULFATE 2 GM/50ML IV SOLN: 2.0000 g | Freq: Every day | INTRAVENOUS | Status: DC | PRN

## 2017-07-29 MED ORDER — PHENYLEPHRINE 40 MCG/ML (10ML) SYRINGE FOR IV PUSH (FOR BLOOD PRESSURE SUPPORT)
PREFILLED_SYRINGE | INTRAVENOUS | Status: DC | PRN
  Administered 2017-07-29 (×5): 40 ug via INTRAVENOUS

## 2017-07-29 MED ORDER — ACETAMINOPHEN 325 MG RE SUPP: 325.0000 mg | RECTAL | Status: DC | PRN

## 2017-07-29 MED ORDER — HYDROMORPHONE HCL 2 MG/ML IJ SOLN
INTRAMUSCULAR | Status: AC
  Filled 2017-07-29: qty 1

## 2017-07-29 MED ORDER — ONDANSETRON HCL 4 MG/2ML IJ SOLN
INTRAMUSCULAR | Status: AC
  Filled 2017-07-29: qty 2

## 2017-07-29 MED ORDER — ALUM & MAG HYDROXIDE-SIMETH 200-200-20 MG/5ML PO SUSP: 15.0000 mL | ORAL | Status: DC | PRN

## 2017-07-29 MED ORDER — CANAGLIFLOZIN 100 MG PO TABS
100.0000 mg | ORAL_TABLET | Freq: Every day | ORAL | Status: DC
  Administered 2017-07-30: 100 mg via ORAL
  Filled 2017-07-29: qty 1

## 2017-07-29 MED ORDER — MIDAZOLAM HCL 2 MG/2ML IJ SOLN
INTRAMUSCULAR | Status: AC
  Filled 2017-07-29: qty 2

## 2017-07-29 MED ORDER — LACTATED RINGERS IV SOLN
INTRAVENOUS | Status: DC | PRN
  Administered 2017-07-29 (×2): via INTRAVENOUS

## 2017-07-29 MED ORDER — LIDOCAINE HCL 1 % IJ SOLN
INTRAMUSCULAR | Status: AC
  Filled 2017-07-29: qty 20

## 2017-07-29 MED ORDER — EPHEDRINE SULFATE 50 MG/ML IJ SOLN
INTRAMUSCULAR | Status: DC | PRN
  Administered 2017-07-29: 5 mg via INTRAVENOUS
  Administered 2017-07-29 (×2): 10 mg via INTRAVENOUS
  Administered 2017-07-29: 5 mg via INTRAVENOUS

## 2017-07-29 MED ORDER — LIDOCAINE 2% (20 MG/ML) 5 ML SYRINGE
INTRAMUSCULAR | Status: DC | PRN
  Administered 2017-07-29: 100 mg via INTRAVENOUS

## 2017-07-29 MED ORDER — ROCURONIUM BROMIDE 10 MG/ML (PF) SYRINGE
PREFILLED_SYRINGE | INTRAVENOUS | Status: AC
  Filled 2017-07-29: qty 5

## 2017-07-29 MED ORDER — HEPARIN SODIUM (PORCINE) 5000 UNIT/ML IJ SOLN
INTRAMUSCULAR | Status: DC | PRN
  Administered 2017-07-29: 07:00:00

## 2017-07-29 MED ORDER — NITROGLYCERIN 0.2 MG/ML ON CALL CATH LAB
INTRAVENOUS | Status: DC | PRN
  Administered 2017-07-29 (×3): 40 ug via INTRAVENOUS
  Administered 2017-07-29: 20 ug via INTRAVENOUS
  Administered 2017-07-29: 40 ug via INTRAVENOUS
  Administered 2017-07-29 (×4): 20 ug via INTRAVENOUS

## 2017-07-29 MED ORDER — SODIUM CHLORIDE 0.9 % IV SOLN
INTRAVENOUS | Status: DC
  Administered 2017-07-29: 23:00:00 via INTRAVENOUS
  Administered 2017-07-29: 75 mL/h via INTRAVENOUS

## 2017-07-29 MED ORDER — CEFAZOLIN SODIUM-DEXTROSE 2-4 GM/100ML-% IV SOLN
2.0000 g | Freq: Three times a day (TID) | INTRAVENOUS | Status: AC
  Administered 2017-07-29 (×2): 2 g via INTRAVENOUS
  Filled 2017-07-29 (×2): qty 100

## 2017-07-29 MED ORDER — PROPOFOL 10 MG/ML IV BOLUS
INTRAVENOUS | Status: AC
  Filled 2017-07-29: qty 20

## 2017-07-29 MED ORDER — OXYCODONE HCL 5 MG/5ML PO SOLN: 5.0000 mg | Freq: Once | ORAL | Status: DC | PRN

## 2017-07-29 MED ORDER — POTASSIUM CHLORIDE CRYS ER 20 MEQ PO TBCR
40.0000 meq | EXTENDED_RELEASE_TABLET | Freq: Once | ORAL | Status: AC
  Administered 2017-07-29: 40 meq via ORAL
  Filled 2017-07-29: qty 2

## 2017-07-29 MED ORDER — METFORMIN HCL 500 MG PO TABS
1000.0000 mg | ORAL_TABLET | Freq: Two times a day (BID) | ORAL | Status: DC
  Administered 2017-07-29 – 2017-07-30 (×2): 1000 mg via ORAL
  Filled 2017-07-29 (×2): qty 2

## 2017-07-29 MED ORDER — INSULIN ASPART 100 UNIT/ML ~~LOC~~ SOLN
0.0000 [IU] | Freq: Three times a day (TID) | SUBCUTANEOUS | Status: DC
  Administered 2017-07-29 (×2): 5 [IU] via SUBCUTANEOUS

## 2017-07-29 MED ORDER — GUAIFENESIN-DM 100-10 MG/5ML PO SYRP: 15.0000 mL | ORAL_SOLUTION | ORAL | Status: DC | PRN

## 2017-07-29 MED ORDER — CEFAZOLIN SODIUM-DEXTROSE 2-4 GM/100ML-% IV SOLN
2.0000 g | INTRAVENOUS | Status: AC
  Administered 2017-07-29: 2 g via INTRAVENOUS
  Filled 2017-07-29: qty 100

## 2017-07-29 MED ORDER — ALBUTEROL SULFATE (2.5 MG/3ML) 0.083% IN NEBU: 3.0000 mL | INHALATION_SOLUTION | Freq: Four times a day (QID) | RESPIRATORY_TRACT | Status: DC | PRN

## 2017-07-29 MED ORDER — OXYCODONE-ACETAMINOPHEN 5-325 MG PO TABS
1.0000 | ORAL_TABLET | ORAL | Status: DC | PRN
  Administered 2017-07-29 – 2017-07-30 (×4): 2 via ORAL
  Filled 2017-07-29 (×4): qty 2

## 2017-07-29 MED ORDER — FUROSEMIDE 20 MG PO TABS
20.0000 mg | ORAL_TABLET | Freq: Every day | ORAL | Status: DC
  Administered 2017-07-30: 20 mg via ORAL
  Filled 2017-07-29 (×2): qty 1

## 2017-07-29 MED ORDER — ONDANSETRON HCL 4 MG/2ML IJ SOLN
INTRAMUSCULAR | Status: DC | PRN
  Administered 2017-07-29: 4 mg via INTRAVENOUS

## 2017-07-29 MED ORDER — EMPAGLIFLOZIN-METFORMIN HCL 12.5-1000 MG PO TABS: 1.0000 | ORAL_TABLET | Freq: Two times a day (BID) | ORAL | Status: DC

## 2017-07-29 MED ORDER — ADULT MULTIVITAMIN W/MINERALS CH
1.0000 | ORAL_TABLET | Freq: Every day | ORAL | Status: DC
Start: 2017-07-29 — End: 2017-07-30
  Administered 2017-07-29: 1 via ORAL
  Filled 2017-07-29: qty 1

## 2017-07-29 MED ORDER — LABETALOL HCL 5 MG/ML IV SOLN: 10.0000 mg | INTRAVENOUS | Status: DC | PRN

## 2017-07-29 MED ORDER — ASPIRIN EC 81 MG PO TBEC
81.0000 mg | DELAYED_RELEASE_TABLET | Freq: Every day | ORAL | Status: DC
  Administered 2017-07-29 – 2017-07-30 (×2): 81 mg via ORAL
  Filled 2017-07-29 (×2): qty 1

## 2017-07-29 MED ORDER — LACTATED RINGERS IV SOLN
INTRAVENOUS | Status: DC | PRN
  Administered 2017-07-29: 07:00:00 via INTRAVENOUS

## 2017-07-29 MED ORDER — ROSUVASTATIN CALCIUM 10 MG PO TABS: 5.0000 mg | ORAL_TABLET | Freq: Every day | ORAL | Status: DC

## 2017-07-29 MED ORDER — LABETALOL HCL 5 MG/ML IV SOLN
INTRAVENOUS | Status: DC | PRN
  Administered 2017-07-29 (×2): 10 mg via INTRAVENOUS

## 2017-07-29 MED ORDER — PHENOL 1.4 % MT LIQD: 1.0000 | OROMUCOSAL | Status: DC | PRN

## 2017-07-29 MED ORDER — DIAZEPAM 2 MG PO TABS: 2.0000 mg | ORAL_TABLET | Freq: Every day | ORAL | Status: DC | PRN

## 2017-07-29 MED ORDER — BISACODYL 10 MG RE SUPP: 10.0000 mg | Freq: Every day | RECTAL | Status: DC | PRN

## 2017-07-29 MED ORDER — SUGAMMADEX SODIUM 200 MG/2ML IV SOLN
INTRAVENOUS | Status: AC
  Filled 2017-07-29: qty 2

## 2017-07-29 MED ORDER — SODIUM CHLORIDE 0.9 % IV SOLN
INTRAVENOUS | Status: AC
  Filled 2017-07-29: qty 1.2

## 2017-07-29 MED ORDER — PANTOPRAZOLE SODIUM 40 MG PO TBEC
40.0000 mg | DELAYED_RELEASE_TABLET | Freq: Every day | ORAL | Status: DC
  Administered 2017-07-30: 40 mg via ORAL
  Filled 2017-07-29 (×2): qty 1

## 2017-07-29 MED ORDER — DOFETILIDE 500 MCG PO CAPS
500.0000 ug | ORAL_CAPSULE | Freq: Two times a day (BID) | ORAL | Status: DC
  Administered 2017-07-29 – 2017-07-30 (×2): 500 ug via ORAL
  Filled 2017-07-29 (×2): qty 1

## 2017-07-29 MED ORDER — PROTAMINE SULFATE 10 MG/ML IV SOLN
INTRAVENOUS | Status: DC | PRN
  Administered 2017-07-29 (×5): 10 mg via INTRAVENOUS

## 2017-07-29 MED ORDER — METOPROLOL TARTRATE 5 MG/5ML IV SOLN: 2.0000 mg | INTRAVENOUS | Status: DC | PRN

## 2017-07-29 MED ORDER — OXYCODONE HCL 5 MG PO TABS: 5.0000 mg | ORAL_TABLET | Freq: Once | ORAL | Status: DC | PRN

## 2017-07-29 MED ORDER — MORPHINE SULFATE (PF) 2 MG/ML IV SOLN: 2.0000 mg | INTRAVENOUS | Status: DC | PRN

## 2017-07-29 MED ORDER — SODIUM CHLORIDE 0.9 % IV SOLN: 500.0000 mL | Freq: Once | INTRAVENOUS | Status: DC | PRN

## 2017-07-29 MED ORDER — PHENYLEPHRINE 40 MCG/ML (10ML) SYRINGE FOR IV PUSH (FOR BLOOD PRESSURE SUPPORT)
PREFILLED_SYRINGE | INTRAVENOUS | Status: AC
  Filled 2017-07-29: qty 10

## 2017-07-29 MED ORDER — ONDANSETRON HCL 4 MG/2ML IJ SOLN: 4.0000 mg | Freq: Four times a day (QID) | INTRAMUSCULAR | Status: DC | PRN

## 2017-07-29 MED ORDER — ROCURONIUM BROMIDE 10 MG/ML (PF) SYRINGE
PREFILLED_SYRINGE | INTRAVENOUS | Status: DC | PRN
  Administered 2017-07-29: 50 mg via INTRAVENOUS
  Administered 2017-07-29: 20 mg via INTRAVENOUS

## 2017-07-29 SURGICAL SUPPLY — 47 items
ADH SKN CLS APL DERMABOND .7 (GAUZE/BANDAGES/DRESSINGS) ×1
CANISTER SUCT 3000ML PPV (MISCELLANEOUS) ×3 IMPLANT
CANNULA VESSEL 3MM 2 BLNT TIP (CANNULA) ×6 IMPLANT
CATH ROBINSON RED A/P 18FR (CATHETERS) ×3 IMPLANT
CLIP LIGATING EXTRA MED SLVR (CLIP) ×3 IMPLANT
CLIP LIGATING EXTRA SM BLUE (MISCELLANEOUS) ×3 IMPLANT
CRADLE DONUT ADULT HEAD (MISCELLANEOUS) ×3 IMPLANT
DECANTER SPIKE VIAL GLASS SM (MISCELLANEOUS) IMPLANT
DERMABOND ADVANCED (GAUZE/BANDAGES/DRESSINGS) ×2
DERMABOND ADVANCED .7 DNX12 (GAUZE/BANDAGES/DRESSINGS) ×1 IMPLANT
DRAIN HEMOVAC 1/8 X 5 (WOUND CARE) IMPLANT
ELECT REM PT RETURN 9FT ADLT (ELECTROSURGICAL) ×3
ELECTRODE REM PT RTRN 9FT ADLT (ELECTROSURGICAL) ×1 IMPLANT
EVACUATOR SILICONE 100CC (DRAIN) IMPLANT
GLOVE BIO SURGEON STRL SZ 6.5 (GLOVE) ×2 IMPLANT
GLOVE BIO SURGEON STRL SZ7.5 (GLOVE) ×2 IMPLANT
GLOVE BIO SURGEONS STRL SZ 6.5 (GLOVE) ×2
GLOVE BIOGEL PI IND STRL 6.5 (GLOVE) IMPLANT
GLOVE BIOGEL PI IND STRL 7.0 (GLOVE) IMPLANT
GLOVE BIOGEL PI IND STRL 8 (GLOVE) IMPLANT
GLOVE BIOGEL PI INDICATOR 6.5 (GLOVE) ×8
GLOVE BIOGEL PI INDICATOR 7.0 (GLOVE) ×2
GLOVE BIOGEL PI INDICATOR 8 (GLOVE) ×2
GLOVE ECLIPSE 6.5 STRL STRAW (GLOVE) ×2 IMPLANT
GLOVE SS BIOGEL STRL SZ 7.5 (GLOVE) ×1 IMPLANT
GLOVE SUPERSENSE BIOGEL SZ 7.5 (GLOVE) ×2
GLOVE SURG SS PI 6.5 STRL IVOR (GLOVE) ×6 IMPLANT
GOWN STRL REUS W/ TWL LRG LVL3 (GOWN DISPOSABLE) ×3 IMPLANT
GOWN STRL REUS W/TWL LRG LVL3 (GOWN DISPOSABLE) ×18
KIT BASIN OR (CUSTOM PROCEDURE TRAY) ×3 IMPLANT
KIT TURNOVER KIT B (KITS) ×3 IMPLANT
NEEDLE 22X1 1/2 (OR ONLY) (NEEDLE) IMPLANT
NS IRRIG 1000ML POUR BTL (IV SOLUTION) ×6 IMPLANT
PACK CAROTID (CUSTOM PROCEDURE TRAY) ×3 IMPLANT
PAD ARMBOARD 7.5X6 YLW CONV (MISCELLANEOUS) ×6 IMPLANT
PATCH HEMASHIELD 8X75 (Vascular Products) ×2 IMPLANT
SHUNT CAROTID BYPASS 10 (VASCULAR PRODUCTS) ×2 IMPLANT
SUT ETHILON 3 0 PS 1 (SUTURE) IMPLANT
SUT PROLENE 6 0 CC (SUTURE) ×7 IMPLANT
SUT SILK 3 0 (SUTURE)
SUT SILK 3-0 18XBRD TIE 12 (SUTURE) IMPLANT
SUT VIC AB 3-0 SH 27 (SUTURE) ×6
SUT VIC AB 3-0 SH 27X BRD (SUTURE) ×2 IMPLANT
SUT VICRYL 4-0 PS2 18IN ABS (SUTURE) ×3 IMPLANT
SYR CONTROL 10ML LL (SYRINGE) IMPLANT
TOWEL GREEN STERILE (TOWEL DISPOSABLE) ×3 IMPLANT
WATER STERILE IRR 1000ML POUR (IV SOLUTION) ×3 IMPLANT

## 2017-07-29 NOTE — Op Note (Signed)
    OPERATIVE REPORT  DATE OF SURGERY: 07/29/2017  PATIENT: Melanie Lucas, 67 y.o. female MRN: [id number]  DOB: Feb 23, 1950  PRE-OPERATIVE DIAGNOSIS: Symptomatic left internal carotid artery stenosis  POST-OPERATIVE DIAGNOSIS:  Same  PROCEDURE: Left carotid endarterectomy and Dacron patch angioplasty  SURGEON:  Curt Jews, M.D.  PHYSICIAN ASSISTANT: Dr. Gae Gallop and Liana Crocker, PA-C  ANESTHESIA: General  EBL: 100 ml  Total I/O In: 1700 [I.V.:1700] Out: 100 [Blood:100]  BLOOD ADMINISTERED: None  DRAINS: None  SPECIMEN: None  COUNTS CORRECT:  YES  PLAN OF CARE: PACU neurologically intact  PATIENT DISPOSITION:  PACU - hemodynamically stable  PROCEDURE DETAILS: The patient was taken to the opera and placed supine position where the area of the left next prepped draped usual sterile fashion.  Incision was made anterior sternocleidomastoid and carried down through the platysma with electrocautery.  The sternocleidomastoid reflected posteriorly and the carotid sheath was opened.  The vagus and hypoglossal nerves were identified and preserved.  The common carotid artery was encircled with an umbilical tape and Rummel tourniquet.  The external carotid was encircled with a blue vessel loop.  The superior thyroid artery was encircled with a 2-0 silk Potts tie and the internal carotid was encircled with an umbilical tape and Rummel tourniquet.  The patient was given 7000 units of intravenous heparin.  After adequate circulation time the internal/external and common carotid arteries were occluded.  The common carotid artery was opened with an 11 blade and logically with Potts scissors.  It was extended through the plaque onto the internal carotid artery.  A 10 shunt was passed up the internal carotid and allowed to backbleed and down the common carotid where it was secured with a Rummel tourniquet.  The endarterectomy was again on the common carotid artery and the plaque was divided  proximally with Potts scissors.  The endarterectomy scanted onto the bifurcation.  The the external carotid endarterectomy of the eversion technique and the internal carotid was endarterectomized in an open fashion.  Remaining atheromatous debris was removed from the endarterectomy plane.  A Finesse Hemashield Dacron patch was brought onto the field and was sewn as a patch angioplasty with a running 6-0 Prolene suture.  Prior to completion of the closure the shunt was removed in the usual flushing maneuvers were undertaken.  The anastomosis completed and excellent flow characteristics were noted in the internal carotid artery.  There did appear to be a flap in the distal external carotid.  For this reason the artery was occluded proximal distal this and a longitudinal incision was made in the distal external carotid artery.  Further plaque was removed and that was reclosed with a running 6-0 Prolene suture.  The stent removed with excellent flow in both the internal and external carotid arteries.  The patient was given 50 mg of protamine to reverse the heparin.  Wounds irrigated with saline.  Hemostasis talus cautery.  Wounds were closed with 3-0 Vicryl to reapproximate sternocleidomastoid over the carotid sheath.  The incision was closed with a running 3-0 Vicryl suture and the skin was closed with a 4-0 subcuticular Vicryl stitch.  Sterile dressing was applied.  Patient was awakened neurologically intact in the operating room was transferred to the recovery room in stable condition   Melanie Lucas, M.D., [facility] 07/29/2017 10:59 AM

## 2017-07-29 NOTE — Progress Notes (Signed)
Patient arrived to 4E room 6 from PACU after left CEA by Dr. Donnetta Hutching.  Telemetry monitor applied and CCMD notified.  Patient oriented to unit and room to include call light and phone.  Will continue to monitor.

## 2017-07-29 NOTE — Progress Notes (Signed)
Pharmacy note per: Tikosyn  Asked to resume home Tikosyn post-op, as patient had reported begin off Tikosyn to PA.  However, patient reports to me that she mixed up which medication was held, and that she has NOT been off Tikosyn. She did not take a dose this morning, but took a dose at home last night.   Eliquis has been on hold for 3 days pre-op; was on Lovenox bridge.  Labs were ordered for today, K+3.7, Mag 1.8.  Prefer K+ at least 4.0 and Mag at least 1.8 while on Tikosyn.  Plan:   Resume Tikosyn 500 mcg PO BID tonight.   Bmet and Magnesium in am.   Follow up for resuming Eliquis when ok with surgery.  Arty Baumgartner Pager: [phone] or phone: [phone] 07/29/2017 6:11 PM

## 2017-07-29 NOTE — Progress Notes (Signed)
  Day of Surgery Note    Subjective:  Says she has a sore throat but not choking and swallowing okay.    Vitals:   07/29/17 1140 07/29/17 1158  BP:  124/70  Pulse: (!) 59 (!) 59  Resp: 14 16  Temp: 97.7 F (36.5 C) 98.2 F (36.8 C)  SpO2: 99% 98%    Incisions:   Clean and dry with mild fullness and mild ecchymosis Extremities:  Moving all extremities equally Cardiac:  regular Lungs:  Non labored Neuro:  In tact; tongue is midline   Assessment/Plan:  This is a 67 y.o. female who is s/p  Left CEA  -pt doing well neurologically -pharmacy consulted to restart Tikosyn as it has been 3 days since she has had it per the pt. -if hgb stable tomorrow and no issues, will restart her Eliquis at that time. -labs in am   Leontine Locket, PA-C 07/29/2017 4:46 PM [phone]

## 2017-07-29 NOTE — H&P (Signed)
Tjay Velazquez, Arvilla Meres, MD      Klarissa Mcilvain, Arvilla Meres, MD  Physician  Vascular Surgery      Consult Note  Signed     Date of Service:  06/24/2017  4:21 PM                   Signed                     Expand widget buttonCollapse widget button    Show:Clear all   ManualTemplateCopied  Added by:     Rosetta Posner, MD   Hover for detailscustomization button                                                                                             untitled image        [facility]     Patient name: Melanie Lucas     MRN: [id number]        DOB: 07/17/1950          Sex: female     REASON FOR CONSULT: Episode of a aphasia, TIA     HPI:  Melanie Lucas is a 68 y.o. female, who is admitted with episode of a aphasia.  She reports that this lasted for approximately 2 hours.  She had had episodes of a aphasia approximately 3 years ago with 2 events that time.  She does have history of chronic atrial fibrillation on Eliquis.  She has a history of coronary artery disease with prior coronary stenting as well.  She is a cigarette smoker and is tried to stop in the past which was unsuccessful.  Also COPD.  History of lymphedema since age 17 right greater than left          Past Medical History:    Diagnosis   Date    .   Cancer (HCC)            Skin- leg     .   Chronic acquired lymphedema            a. R>L;  b. 03/2012 Neg LE U/S for DVT. Legs since age 7    .   Complication of anesthesia            woke up during colonoscopy, Lithrostrixpy, Biospy    .   COPD (chronic obstructive pulmonary disease) ([place])        .   Coronary artery disease        .   CVD (cardiovascular disease)        .   Diabetes mellitus            Type II    .   Dysrhythmia             afib    .   Elevated TSH            a. 10/2012 - inst to f/u  PCP.    .   Hematuria            a. while on pradaxa,  she reports that she has seen Dr Margie Ege and had low risk cystoscopy    .   History of kidney stones        .   Hyperlipidemia        .   Hypertension            patient denies    .   Lacunar infarction ([place])            a. 02/2009 non-acute Lacunar infarct of the right thalamus noted on MRI of brain.    [name]   Lymphedema of extremity            had this problem, since she was a teenager. especially seen in R LE    .   Meniere disease        .   Myocardial infarction ([place])        .   Obesity        .   Osteoarthritis            cervical & lumbar region, knees, hands cramp also     .   PAF with post-termination pauses            a. on dronedarone;  b. CHA2DS2VASc = 5 (HTN, DM, h/o lacunar infarct on MRI, Female) ->refused oral anticoagulation after h/o hematuria on pradaxa;  c. 02/2012 Echo: EF 55-60%, mildly dil LA. D. Recurrent PAF 10/2012 after only taking Multaq 1x/day - spont conv to NSR, placed back on BID Multaq/eliquis;  e. 10/2014 Multaq d/c'd->tikosyn initiated.    .   Shortness of breath dyspnea            with exertion    .   Stroke [facility])            2 mini strokes    .   Tobacco abuse                    Family History    Problem   Relation   Age of Onset    .   Heart attack   Father        .   Coronary artery disease   Father                strong family hx    .   Bladder Cancer   Mother                bladder    .   Leukemia   Brother        .   Breast cancer   Maternal Aunt        .   Breast cancer   Paternal Aunt        .   Breast cancer   Maternal Aunt              SOCIAL HISTORY:   Social History             Socioeconomic History     .   Marital status:   Married            Spouse name:   Not on file    .   Number of children:   Not on file    .   Years of education:   Not on file    .  Highest education level:   Not on file    Occupational History    .   Occupation:   retired            Comment: Pharmacist, hospital - art    Social Needs    .   Financial resource strain:   Not on file    .   Food insecurity:            Worry:   Not on file            Inability:   Not on file    .   Transportation needs:            Medical:   Not on file            Non-medical:   Not on file    Tobacco Use    .   Smoking status:   Current Every Day Smoker            Packs/day:   1.00            Years:   55.00            Pack years:   55.00            Types:   Cigarettes    .   Smokeless tobacco:   Never Used    .   Tobacco comment: not ready to quit    Substance and Sexual Activity    .   Alcohol use:   Yes            Comment: 1 beer per month    .   Drug use:   No    .   Sexual activity:   Never    Lifestyle    .   Physical activity:            Days per week:   Not on file            Minutes per session:   Not on file    .   Stress:   Not on file    Relationships    .   Social connections:            Talks on phone:   Not on file            Gets together:   Not on file            Attends religious service:   Not on file            Active member of club or organization:   Not on file            Attends meetings of clubs or organizations:   Not on file            Relationship status:   Not on file    .   Intimate partner violence:            Fear of current or ex partner:   Not on file            Emotionally abused:   Not on file            Physically  abused:   Not on file            Forced sexual activity:   Not on file    Other Topics   Concern    .   Not on file  Social History Narrative        She operates an entertainment business                Allergies    Allergen   Reactions    .   Avelox [Moxifloxacin]                unknown    .   Chantix [Varenicline]                nightmares    .   Fosamax [Alendronate]                Pain all over    .   Other                Blood thinners-extreme bleeding    .   Statins   Other (See Comments) and Nausea And Vomiting            Muscle pain  Other reaction(s): Dizziness (intolerance)    .   Sulfamethoxazole   Nausea And Vomiting            Other reaction(s): Dizziness (intolerance)    .   Sulfonamide Derivatives   Hives            Nausea vertigo    .   Ciprofloxacin   Rash    .   Gabapentin   Rash            burning    .   Tape   Itching and Rash            Please use "paper" tape only                    Current Facility-Administered Medications    Medication   Dose   Route   Frequency   Provider   Last Rate   Last Dose    .   acetaminophen (TYLENOL) tablet 650 mg    650 mg   Oral   Q4H PRN   Etta Quill, DO       650 mg at 06/23/17 2042        Or    .   acetaminophen (TYLENOL) solution 650 mg    650 mg   Per Tube   Q4H PRN   Etta Quill, DO                Or    .   acetaminophen (TYLENOL) suppository 650 mg    650 mg   Rectal   Q4H PRN   Etta Quill, DO            .   albuterol (PROVENTIL) (2.5 MG/3ML) 0.083% nebulizer solution 2.5 mg    2.5 mg   Inhalation   Q6H PRN   Etta Quill, DO            .   apixaban (ELIQUIS) tablet 5 mg    5 mg   Oral   BID   Burnetta Sabin L, NP       5 mg at 06/24/17 1315     .   diazepam (VALIUM) tablet 2 mg    2 mg   Oral   Daily PRN   Etta Quill, DO            .   dofetilide (TIKOSYN) capsule 500 mcg    500 mcg   Oral   BID  Etta Quill, DO       500 mcg at 06/24/17 1315    .   DULoxetine (CYMBALTA) DR capsule 30 mg    30 mg   Oral   Liz Beach M, DO       30 mg at 06/23/17 2042    .   insulin aspart (novoLOG) injection 0-15 Units    0-15 Units   Subcutaneous   TID WC   Etta Quill, DO       5 Units at 06/24/17 1300    .   multivitamin with minerals tablet 1 tablet    1 tablet   Oral   QHS   Jennette Kettle M, DO            .   nicotine (NICODERM CQ - dosed in mg/24 hours) patch 21 mg    21 mg   Transdermal   Daily   Caren Griffins, MD       21 mg at 06/24/17 1055    .   rosuvastatin (CRESTOR) tablet 5 mg    5 mg   Oral   QHS   Jennette Kettle M, DO       5 mg at 06/23/17 2241              Current Outpatient Medications    Medication   Sig   Dispense   Refill    .   albuterol (PROVENTIL HFA;VENTOLIN HFA) 108 (90 Base) MCG/ACT inhaler   Inhale 1-2 puffs into the lungs every 6 (six) hours as needed for wheezing or shortness of breath.            .   diazepam (VALIUM) 2 MG tablet   Take 2 mg by mouth daily as needed for anxiety.             .   dofetilide (TIKOSYN) 500 MCG capsule   Take 1 capsule (500 mcg total) 2 (two) times daily by mouth.   180 capsule   2    .   DULoxetine (CYMBALTA) 30 MG capsule   Take 1 capsule by mouth every other day.             [name]   ELIQUIS 5 MG TABS tablet   TAKE 1 TABLET (5 MG TOTAL) BY MOUTH 2 (TWO) TIMES DAILY.   60 tablet   9    .   Empagliflozin-Metformin HCl ER (SYNJARDY XR) 11-998 MG TB24   Take 1 tablet by mouth 2 (two) times daily.             .   furosemide (LASIX) 20 MG tablet   Take 20 mg by mouth daily.              [name]   ibuprofen (ADVIL,MOTRIN) 800 MG tablet   Take 800 mg by mouth 3 (three) times daily as needed for mild pain.             .   Multiple Vitamin (MULTIVITAMIN) tablet   Take 1 tablet by mouth at bedtime.             .   rosuvastatin (CRESTOR) 5 MG tablet   Take 5 mg by mouth at bedtime.             [name]   VALTREX 500 MG tablet   Take 500 mg by mouth 2 (two) times daily as needed (for outbreaks).             [name]  zoledronic acid (RECLAST) 5 MG/100ML SOLN injection   Inject 5 mg into the vein every 6 (six) months.             [name]   aspirin EC 81 MG tablet   Take 1 tablet (81 mg total) by mouth daily.   30 tablet   1    .   [START ON 06/25/2017] nicotine (NICODERM CQ - DOSED IN MG/24 HOURS) 21 mg/24hr patch   Place 1 patch (21 mg total) onto the skin daily.   28 patch   0          REVIEW OF SYSTEMS:      Reviewed and her history and physical with nothing to add        PHYSICAL EXAM:         Vitals:        06/24/17 0137   06/24/17 0330   06/24/17 0530   06/24/17 1201    BP:   (!) 151/54   (!) 147/76   (!) 125/48   (!) 145/68    Pulse:   (!) 59   61   (!) 55   (!) 49    Resp:   16   17   16   18     Temp:   98.7 F (37.1 C)   98.7 F (37.1 C)   98.7 F (37.1 C)   98.5 F (36.9 C)    TempSrc:   Oral   Oral   Oral   Oral    SpO2:   96%   96%   97%   98%    Weight:                    Height:                          GENERAL: The patient is a well-nourished female, in no acute distress. The vital signs are documented above.  CARDIOVASCULAR: 2+ radial 2+ popliteal and 2+ dorsalis pedis pulses.  She does have changes of lymphedema with no skin changes right greater than left  PULMONARY: There is good air exchange   ABDOMEN: Soft and non-tender   MUSCULOSKELETAL: There are no major deformities or  cyanosis.  NEUROLOGIC: No focal weakness or paresthesias are detected.  SKIN: There are no ulcers or rashes noted.  PSYCHIATRIC: The patient has a normal affect.     DATA:   I reviewed her CT angiogram.  This does show 50 to 60% stenoses bilaterally.  She does have a very irregular plaque on the left with what appears to be soft central area with calcification surrounding this.     MEDICAL ISSUES:  Had a long discussion with the patient, her husband and daughter present.  Explained that it is impossible to know if this was the cause of her event.  I did feel that this was the most likely source of her left brain event.  She is right-handed.  I spoke with Dr.Xu with the stroke service who also agrees this is the most likely source.  I have recommended carotid endarterectomy for reduction of stroke risk.  She and her family are questioning whether this can be delayed until after an upcoming trip.  I did explain the only risk would be having another neurologic event prior to correction of her carotid stenosis and would recommend surgery sooner than later.  Explained that I was available on Wednesday after  a 2-day discontinuation of Eliquis.  After my meeting with the patient, the patient decided to be discharged.  I will arrange follow-up to schedule surgery        Rosetta Posner, MD [facility]  [facility] [phone]  Pager [phone]                       Electronically signed by Rosetta Posner, MD at 06/24/2017  6:22 PM               Addendum:  The patient has been re-examined and re-evaluated.  The patient's history and physical has been reviewed and is unchanged.    Melanie Lucas is a 67 y.o. female is being admitted with LEFT CAROTID STENOSIS. All the risks, benefits and other treatment options have been discussed with the patient. The patient has consented to proceed with Procedure(s): ENDARTERECTOMY CAROTID  LEFT as a surgical intervention.  Efrata Brunner 07/29/2017 7:20 AM Vascular and Vein Surgery

## 2017-07-29 NOTE — Discharge Instructions (Signed)
° °  [facility] [place] ° °Discharge Instructions °  °Carotid Endarterectomy (CEA) ° °Please refer to the following instructions for your post-procedure care. Your surgeon or physician assistant will discuss any changes with you. ° °Activity ° °You are encouraged to walk as much as you can. You can slowly return to normal activities but must avoid strenuous activity and heavy lifting until your doctor tell you it's okay. Avoid activities such as vacuuming or swinging a golf club. You can drive after one week if you are comfortable and you are no longer taking prescription pain medications. It is normal to feel tired for serval weeks after your surgery. It is also normal to have difficulty with sleep habits, eating, and bowel movements after surgery. These will go away with time. ° °Bathing/Showering ° °Shower daily after you go home. Do not soak in a bathtub, hot tub, or swim until the incision heals completely. ° °Incision Care ° °Shower every day. Clean your incision with mild soap and water. Pat the area dry with a clean towel. You do not need a bandage unless otherwise instructed. Do not apply any ointments or creams to your incision. You may have skin glue on your incision. Do not peel it off. It will come off on its own in about one week. Your incision may feel thickened and raised for several weeks after your surgery. This is normal and the skin will soften over time.  ° °For Men Only: It's okay to shave around the incision but do not shave the incision itself for 2 weeks. It is common to have numbness under your chin that could last for several months. ° °Diet ° °Resume your normal diet. There are no special food restrictions following this procedure. A low fat/low cholesterol diet is recommended for all patients with vascular disease. In order to heal from your surgery, it is CRITICAL to get adequate nutrition. Your body requires vitamins, minerals, and protein. Vegetables are the  best source of vitamins and minerals. Vegetables also provide the perfect balance of protein. Processed food has little nutritional value, so try to avoid this. ° °Medications ° °Resume taking all of your medications unless your doctor or physician assistant tells you not to. If your incision is causing pain, you may take over-the- counter pain relievers such as acetaminophen (Tylenol). If you were prescribed a stronger pain medication, please be aware these medications can cause nausea and constipation. Prevent nausea by taking the medication with a snack or meal. Avoid constipation by drinking plenty of fluids and eating foods with a high amount of fiber, such as fruits, vegetables, and grains.  °Do not take Tylenol if you are taking prescription pain medications. ° °Follow Up ° °Our office will schedule a follow up appointment 2-3 weeks following discharge. ° °Please call us immediately for any of the following conditions ° °Increased pain, redness, drainage (pus) from your incision site. °Fever of 101 degrees or higher. °If you should develop stroke (slurred speech, difficulty swallowing, weakness on one side of your body, loss of vision) you should call 911 and go to the nearest emergency room. ° °Reduce your risk of vascular disease: ° °Stop smoking. If you would like help call [facility] at 1-800-QUIT-NOW (1-800-784-8669) or [place] at 336-586-4000. °Manage your cholesterol °Maintain a desired weight °Control your diabetes °Keep your blood pressure down ° °If you have any questions, please call the office at [phone]. ° °

## 2017-07-29 NOTE — Anesthesia Postprocedure Evaluation (Signed)
Anesthesia Post Note  Patient: AMARIANNA ABPLANALP  Procedure(s) Performed: ENDARTERECTOMY CAROTID LEFT (Left Neck) PATCH ANGIOPLASTY USING HEMASHIELD GOLD VASCULAR PATCH (Left Neck)     Patient location during evaluation: PACU Anesthesia Type: General Level of consciousness: awake and sedated Pain management: pain level controlled Vital Signs Assessment: post-procedure vital signs reviewed and stable Respiratory status: spontaneous breathing, nonlabored ventilation, respiratory function stable and patient connected to nasal cannula oxygen Cardiovascular status: blood pressure returned to baseline and stable Postop Assessment: no apparent nausea or vomiting Anesthetic complications: no    Last Vitals:  Vitals:   07/29/17 1140 07/29/17 1158  BP:  124/70  Pulse: (!) 59 (!) 59  Resp: 14 16  Temp: 36.5 C 36.8 C  SpO2: 99% 98%    Last Pain:  Vitals:   07/29/17 1253  TempSrc:   PainSc: 8                  Theodis Kinsel,JAMES TERRILL

## 2017-07-29 NOTE — Anesthesia Procedure Notes (Signed)
Procedure Name: Intubation Date/Time: 07/29/2017 7:34 AM Performed by: Renato Shin, CRNA Pre-anesthesia Checklist: Patient identified, Emergency Drugs available, Suction available and Patient being monitored Patient Re-evaluated:Patient Re-evaluated prior to induction Oxygen Delivery Method: Circle system utilized Preoxygenation: Pre-oxygenation with 100% oxygen Induction Type: IV induction Ventilation: Mask ventilation without difficulty Laryngoscope Size: Miller and 2 Grade View: Grade II Tube type: Oral Tube size: 7.0 mm Number of attempts: 1 Airway Equipment and Method: Stylet Placement Confirmation: ETT inserted through vocal cords under direct vision,  positive ETCO2,  breath sounds checked- equal and bilateral and CO2 detector Secured at: 21 cm Tube secured with: Tape Dental Injury: Teeth and Oropharynx as per pre-operative assessment

## 2017-07-29 NOTE — Anesthesia Procedure Notes (Signed)
Arterial Line Insertion Start/End6/11/2017 7:05 AM, 07/29/2017 7:10 AM Performed by: CRNA  Preanesthetic checklist: patient identified, IV checked, site marked, risks and benefits discussed, surgical consent, monitors and equipment checked, pre-op evaluation, timeout performed and anesthesia consent Lidocaine 1% used for infiltration and patient sedated Right, radial was placed Catheter size: 20 G Hand hygiene performed , maximum sterile barriers used  and Seldinger technique used Allen's test indicative of satisfactory collateral circulation Attempts: 1 Procedure performed without using ultrasound guided technique. Following insertion, dressing applied and Biopatch. Post procedure assessment: normal  Patient tolerated the procedure well with no immediate complications.

## 2017-07-29 NOTE — Transfer of Care (Signed)
Immediate Anesthesia Transfer of Care Note  Patient: Melanie Lucas  Procedure(s) Performed: ENDARTERECTOMY CAROTID LEFT (Left Neck) PATCH ANGIOPLASTY USING HEMASHIELD GOLD VASCULAR PATCH (Left Neck)  Patient Location: PACU  Anesthesia Type:General  Level of Consciousness: awake, alert , drowsy and patient cooperative  Airway & Oxygen Therapy: Patient Spontanous Breathing and Patient connected to nasal cannula oxygen  Post-op Assessment: Report given to RN, Post -op Vital signs reviewed and stable, Patient moving all extremities X 4 and Patient able to stick tongue midline  Post vital signs: Reviewed  Last Vitals:  Vitals Value Taken Time  BP 124/68 07/29/2017 10:15 AM  Temp    Pulse 55 07/29/2017 10:20 AM  Resp 13 07/29/2017 10:20 AM  SpO2 99 % 07/29/2017 10:20 AM  Vitals shown include unvalidated device data.  Last Pain:  Vitals:   07/29/17 0602  TempSrc:   PainSc: 4       Patients Stated Pain Goal: 3 (93/73/42 8768)  Complications: No apparent anesthesia complications

## 2017-07-30 ENCOUNTER — Encounter (HOSPITAL_COMMUNITY): Payer: Self-pay | Admitting: Vascular Surgery

## 2017-07-30 ENCOUNTER — Telehealth: Payer: Self-pay | Admitting: Vascular Surgery

## 2017-07-30 LAB — BASIC METABOLIC PANEL
Anion gap: 7 (ref 5–15)
BUN: 13 mg/dL (ref 6–20)
CALCIUM: 8.1 mg/dL — AB (ref 8.9–10.3)
CO2: 26 mmol/L (ref 22–32)
Chloride: 108 mmol/L (ref 101–111)
Creatinine, Ser: 0.65 mg/dL (ref 0.44–1.00)
GFR calc Af Amer: >60 mL/min (ref >60)
GLUCOSE: 162 mg/dL — AB (ref 65–99)
POTASSIUM: 4 mmol/L (ref 3.5–5.1)
SODIUM: 141 mmol/L (ref 135–145)

## 2017-07-30 LAB — CBC
HCT: 41.4 % (ref 36.0–46.0)
Hemoglobin: 13.9 g/dL (ref 12.0–15.0)
MCH: 31.4 pg (ref 26.0–34.0)
MCHC: 33.6 g/dL (ref 30.0–36.0)
MCV: 93.7 fL (ref 78.0–100.0)
PLATELETS: 155 10*3/uL (ref 150–400)
RBC: 4.42 MIL/uL (ref 3.87–5.11)
RDW: 12.7 % (ref 11.5–15.5)
WBC: 9.5 10*3/uL (ref 4.0–10.5)

## 2017-07-30 LAB — GLUCOSE, CAPILLARY: Glucose-Capillary: 177 mg/dL — ABNORMAL HIGH (ref 65–99)

## 2017-07-30 LAB — MAGNESIUM: MAGNESIUM: 1.8 mg/dL (ref 1.7–2.4)

## 2017-07-30 MED ORDER — OXYCODONE-ACETAMINOPHEN 5-325 MG PO TABS: 1.0000 | ORAL_TABLET | Freq: Four times a day (QID) | ORAL | 0 refills | Status: DC | PRN

## 2017-07-30 NOTE — Progress Notes (Addendum)
  Progress Note    07/30/2017 7:23 AM 1 Day Post-Op  Subjective:  She denies slurring speech, changes in vision, or one sided weakness.   Vitals:   07/30/17 0003 07/30/17 0417  BP: (!) 134/56 (!) 146/77  Pulse:  (!) 58  Resp:  13  Temp: 98.6 F (37 C) 98.8 F (37.1 C)  SpO2:  93%   Physical Exam: Lungs:  Non labored Incisions:  L neck incision painful to touch however soft and without drainage Extremities:  Moving all extremities well Abdomen:  soft Neurologic: CN III-XII grossly intact  CBC    Component Value Date/Time   WBC 9.5 07/30/2017 0445   RBC 4.42 07/30/2017 0445   HGB 13.9 07/30/2017 0445   HGB 14.8 11/26/2016 1550   HCT 41.4 07/30/2017 0445   HCT 43.5 11/26/2016 1550   PLT 155 07/30/2017 0445   PLT 180 11/26/2016 1550   MCV 93.7 07/30/2017 0445   MCV 91 11/26/2016 1550   MCH 31.4 07/30/2017 0445   MCHC 33.6 07/30/2017 0445   RDW 12.7 07/30/2017 0445   RDW 14.0 11/26/2016 1550   LYMPHSABS 2.8 06/23/2017 1714   LYMPHSABS 2.2 11/26/2016 1550   MONOABS 0.6 06/23/2017 1714   EOSABS 0.1 06/23/2017 1714   EOSABS 0.2 11/26/2016 1550   BASOSABS 0.0 06/23/2017 1714   BASOSABS 0.0 11/26/2016 1550    BMET    Component Value Date/Time   NA 141 07/30/2017 0445   NA 142 11/26/2016 1550   K 4.0 07/30/2017 0445   CL 108 07/30/2017 0445   CO2 26 07/30/2017 0445   GLUCOSE 162 (H) 07/30/2017 0445   BUN 13 07/30/2017 0445   BUN 15 11/26/2016 1550   CREATININE 0.65 07/30/2017 0445   CREATININE 0.77 10/17/2015 1824   CALCIUM 8.1 (L) 07/30/2017 0445   GFRNONAA >60 07/30/2017 0445   GFRAA >60 07/30/2017 0445    INR    Component Value Date/Time   INR 1.06 07/23/2017 1502     Intake/Output Summary (Last 24 hours) at 07/30/2017 0723 Last data filed at 07/30/2017 0424 Gross per 24 hour  Intake 3330 ml  Output 200 ml  Net 3130 ml     Assessment/Plan:  67 y.o. female is s/p L CEA due to symptomatic carotid stenosis 1 Day Post-Op   No events  overnight Ok for discharge home today Follow up in office 2 weeks Eliquis will be resumed   Dagoberto Ligas, PA-C [facility] [phone] 07/30/2017 7:23 AM  I have examined the patient, reviewed and agree with above.  Incision healing nicely.  Okay for discharge today.  Curt Jews, MD 07/30/2017 8:08 AM

## 2017-07-30 NOTE — Progress Notes (Signed)
A-line d/c'd after morning labs. Right IV d/c'd d/t proximity to A-line. Patient has left IV that flushes and works well. Will continue to monitor

## 2017-07-30 NOTE — Plan of Care (Signed)
  Problem: Education: Goal: Knowledge of General Education information will improve Outcome: Progressing   Problem: Health Behavior/Discharge Planning: Goal: Ability to manage health-related needs will improve Outcome: Progressing   Problem: Clinical Measurements: Goal: Ability to maintain clinical measurements within normal limits will improve Outcome: Progressing   Problem: Activity: Goal: Risk for activity intolerance will decrease Outcome: Progressing   Problem: Nutrition: Goal: Adequate nutrition will be maintained Outcome: Progressing

## 2017-07-30 NOTE — Care Management Note (Signed)
Case Management Note Marvetta Gibbons RN, BSN Unit 4E-Case Manager [phone]  Patient Details  Name: Melanie Lucas MRN: [id number] Date of Birth: 1950-07-16  Subjective/Objective:   Pt admitted s/p CEA                 Action/Plan: PTA pt lived at home- notified by tiffany with [facility] pt has pre-op referral for any HH needs- pt for discharge home today- Tiffany notified of pt's transition home- [facility] will f/u in the home tomorrow.   Expected Discharge Date:  07/30/17               Expected Discharge Plan:  [place]  In-House Referral:  NA  Discharge planning Services  CM Consult  Post Acute Care Choice:  Home Health Choice offered to:     DME Arranged:    DME Agency:     HH Arranged:    [place] Agency:  [facility]  Status of Service:     If discussed at [place] of Stay Meetings, dates discussed:    Discharge Disposition:  Home/home health   Additional Comments:  Monserrat, Vidaurri, RN 07/30/2017, 4:28 PM

## 2017-07-30 NOTE — Telephone Encounter (Signed)
sch appt spk to pt husband 08/13/17 330pm f/u MD

## 2017-07-30 NOTE — Progress Notes (Signed)
07/30/2017 1000 Discharge AVS meds taken today and those due this evening reviewed.  Follow-up appointments and when to call md reviewed.  D/C IV and TELE.  Questions and concerns addressed.   D/C home per orders. [place]

## 2017-07-31 NOTE — Discharge Summary (Signed)
Discharge Summary     Melanie Lucas [birth year] 67 y.o. female  [id number]  Admission Date: 07/29/2017  Discharge Date: 07/30/17  Physician: Dr. Donnetta Hutching  Admission Diagnosis: LEFT CAROTID STENOSIS  Discharge Day services:    see progress note 07/30/17 Physical Exam: Vitals:   07/30/17 0417 07/30/17 0741  BP: (!) 146/77 (!) 143/70  Pulse: (!) 58 (!) 45  Resp: 13 12  Temp: 98.8 F (37.1 C)   SpO2: 93% 98%    Hospital Course:  The patient was admitted to the hospital and taken to the operating room on 07/29/2017 and underwent L carotid endarterectomy.  The pt tolerated the procedure well and was transported to the PACU in good condition.   By POD 1, the pt neuro status remained at baseline  The remainder of the hospital course consisted of increasing mobilization and increasing intake of solids without difficulty.  She will follow up in office in 2 weeks.  Discharge instructions were reviewed with the patient and she voiced her understanding.  She was discharged in stable condition.  Recent Labs    07/29/17 1328 07/30/17 0445  NA 140 141  K 3.7 4.0  CL 104 108  CO2 27 26  GLUCOSE 233* 162*  BUN 12 13  CALCIUM 8.5* 8.1*   Recent Labs    07/30/17 0445  WBC 9.5  HGB 13.9  HCT 41.4  PLT 155   No results for input(s): INR in the last 72 hours.     Discharge Diagnosis:  LEFT CAROTID STENOSIS  Secondary Diagnosis: Patient Active Problem List   Diagnosis Date Noted  . Left carotid artery stenosis 07/29/2017  . TIA (transient ischemic attack) 06/24/2017  . Acute ischemic stroke ([place]) 06/23/2017  . Cervical spondylosis with myelopathy 02/21/2016  . Bilateral leg edema 11/24/2014  . Cervical stenosis of spine 11/24/2014  . Numbness of fingers of both hands 11/24/2014  . HLD (hyperlipidemia) 11/09/2014  . Diabetes mellitus ([place]) 11/09/2014  . Pulmonary hypertension ([place]) 08/07/2013  . CAD (coronary artery disease) 03/11/2013  . Peripheral edema  02/25/2012  . Essential hypertension, benign 05/06/2009  . Overweight 04/13/2009  . TOBACCO ABUSE 04/13/2009  . A-fib ([place]) 04/13/2009  . CHEST PAIN, ATYPICAL 04/13/2009   Past Medical History:  Diagnosis Date  . Anxiety   . Cancer (HCC)    Skin- leg   . Chronic acquired lymphedema    a. R>L;  b. 03/2012 Neg LE U/S for DVT. Legs since age 26  . Complication of anesthesia    woke up during colonoscopy, Lithrostrixpy, Biospy  . COPD (chronic obstructive pulmonary disease) ([place])   . Coronary artery disease   . CVD (cardiovascular disease)   . Deaf, right   . Diabetes mellitus    Type II  . Diabetic neuropathy ([place])   . Dysrhythmia    afib  . Elevated TSH    a. 10/2012 - inst to f/u PCP.  . Full dentures   . Hematuria    a. while on pradaxa,  she reports that she has seen Dr Margie Ege and had low risk cystoscopy  . History of kidney stones   . Hyperlipidemia   . Hypertension    patient denies  . Incontinence    prior to urinating  . Lacunar infarction ([place])    a. 02/2009 non-acute Lacunar infarct of the right thalamus noted on MRI of brain.  . Left carotid stenosis   . Lymphedema of extremity    had this problem, since she was  a teenager. especially seen in R LE  . Meniere disease   . Myocardial infarction ([place])   . Obesity   . Osteoarthritis    cervical & lumbar region, knees, hands cramp also   . PAF with post-termination pauses    a. on dronedarone;  b. CHA2DS2VASc = 5 (HTN, DM, h/o lacunar infarct on MRI, Female) ->refused oral anticoagulation after h/o hematuria on pradaxa;  c. 02/2012 Echo: EF 55-60%, mildly dil LA. D. Recurrent PAF 10/2012 after only taking Multaq 1x/day - spont conv to NSR, placed back on BID Multaq/eliquis;  e. 10/2014 Multaq d/c'd->tikosyn initiated.  . Shortness of breath dyspnea    with exertion  . Stroke [facility])    2 mini strokes  . Tobacco abuse     Allergies as of 07/30/2017      Reactions   Avelox [moxifloxacin]    Unknown reaction    Chantix  [varenicline]    nightmares   Fosamax [alendronate]    Pain all over   Pradaxa [dabigatran Etexilate Mesylate]    Extreme bleeding   Statins Nausea And Vomiting, Other (See Comments)   Muscle pain, Dizziness (intolerance)   Sulfamethoxazole Nausea And Vomiting   Dizziness (intolerance)   Sulfonamide Derivatives Hives   Nausea vertigo   Ciprofloxacin Rash   Gabapentin Rash   burning   Tape Itching, Rash   Please use "paper" tape only      Medication List    TAKE these medications   albuterol 108 (90 Base) MCG/ACT inhaler Commonly known as:  PROVENTIL HFA;VENTOLIN HFA Inhale 2 puffs into the lungs every 6 (six) hours as needed for wheezing or shortness of breath.   aspirin EC 81 MG tablet Take 1 tablet (81 mg total) by mouth daily.   diazepam 2 MG tablet Commonly known as:  VALIUM Take 2 mg by mouth daily as needed (dizziness).   dofetilide 500 MCG capsule Commonly known as:  TIKOSYN Take 1 capsule (500 mcg total) 2 (two) times daily by mouth.   DULoxetine 60 MG capsule Commonly known as:  CYMBALTA Take 40 mg by mouth daily.   ELIQUIS 5 MG Tabs tablet Generic drug:  apixaban TAKE 1 TABLET (5 MG TOTAL) BY MOUTH 2 (TWO) TIMES DAILY.   enoxaparin 80 MG/0.8ML injection Commonly known as:  LOVENOX Inject 0.8 mLs (80 mg total) into the skin every 12 (twelve) hours.   furosemide 20 MG tablet Commonly known as:  LASIX Take 20 mg by mouth daily.   ibuprofen 800 MG tablet Commonly known as:  ADVIL,MOTRIN Take 800 mg by mouth 3 (three) times daily as needed for mild pain.   multivitamin tablet Take 1 tablet by mouth at bedtime.   nicotine 21 mg/24hr patch Commonly known as:  NICODERM CQ - dosed in mg/24 hours Place 1 patch (21 mg total) onto the skin daily.   oxyCODONE-acetaminophen 5-325 MG tablet Commonly known as:  PERCOCET/ROXICET Take 1 tablet by mouth every 6 (six) hours as needed for moderate pain.   RECLAST 5 MG/100ML Soln injection Generic drug:   zoledronic acid Inject 5 mg into the vein every 6 (six) months.   rosuvastatin 5 MG tablet Commonly known as:  CRESTOR Take 5 mg by mouth at bedtime.   SINUS HEADACHE PE MAX ST 5-325 MG Tabs Generic drug:  Phenylephrine-Acetaminophen Take 1 tablet by mouth daily as needed (sinus headaches).   SYNJARDY 12.06-998 MG Tabs Generic drug:  Empagliflozin-metFORMIN HCl Take 1 tablet by mouth 2 (two) times daily.  VALTREX 500 MG tablet Generic drug:  valACYclovir Take 500 mg by mouth 2 (two) times daily as needed (for outbreaks).        Discharge Instructions:   [facility] Discharge Instructions Carotid Endarterectomy (CEA)  Please refer to the following instructions for your post-procedure care. Your surgeon or physician assistant will discuss any changes with you.  Activity  You are encouraged to walk as much as you can. You can slowly return to normal activities but must avoid strenuous activity and heavy lifting until your doctor tell you it's OK. Avoid activities such as vacuuming or swinging a golf club. You can drive after one week if you are comfortable and you are no longer taking prescription pain medications. It is normal to feel tired for serval weeks after your surgery. It is also normal to have difficulty with sleep habits, eating, and bowel movements after surgery. These will go away with time.  Bathing/Showering  You may shower after you come home. Do not soak in a bathtub, hot tub, or swim until the incision heals completely.  Incision Care  Shower every day. Clean your incision with mild soap and water. Pat the area dry with a clean towel. You do not need a bandage unless otherwise instructed. Do not apply any ointments or creams to your incision. You may have skin glue on your incision. Do not peel it off. It will come off on its own in about one week. Your incision may feel thickened and raised for several weeks after your surgery.  This is normal and the skin will soften over time. For Men Only: It's OK to shave around the incision but do not shave the incision itself for 2 weeks. It is common to have numbness under your chin that could last for several months.  Diet  Resume your normal diet. There are no special food restrictions following this procedure. A low fat/low cholesterol diet is recommended for all patients with vascular disease. In order to heal from your surgery, it is CRITICAL to get adequate nutrition. Your body requires vitamins, minerals, and protein. Vegetables are the best source of vitamins and minerals. Vegetables also provide the perfect balance of protein. Processed food has little nutritional value, so try to avoid this.  Medications  Resume taking all of your medications unless your doctor or physician assistant tells you not to.  If your incision is causing pain, you may take over-the- counter pain relievers such as acetaminophen (Tylenol). If you were prescribed a stronger pain medication, please be aware these medications can cause nausea and constipation.  Prevent nausea by taking the medication with a snack or meal. Avoid constipation by drinking plenty of fluids and eating foods with a high amount of fiber, such as fruits, vegetables, and grains. Do not take Tylenol if you are taking prescription pain medications.  Follow Up  Our office will schedule a follow up appointment 2-3 weeks following discharge.  Please call us immediately for any of the following conditions  Increased pain, redness, drainage (pus) from your incision site. Fever of 101 degrees or higher. If you should develop stroke (slurred speech, difficulty swallowing, weakness on one side of your body, loss of vision) you should call 911 and go to the nearest emergency room.  Reduce your risk of vascular disease:  Stop smoking. If you would like help call [facility] at 1-800-QUIT-NOW (249)005-9836) or [place] at  [phone]. Manage your cholesterol Maintain a desired weight Control your diabetes  Keep your blood pressure down  If you have any questions, please call the office at [phone].  Disposition: home  Patient's condition: is Good  Follow up: 1. Dr. Donnetta Hutching in 2 weeks.   Dagoberto Ligas, PA-C [facility] [phone]   --- For [facility] Registry use ---   Modified Rankin score at D/C (0-6): 0  IV medication needed for:  1. Hypertension: No 2. Hypotension: No  Post-op Complications: No  1. Post-op CVA or TIA: No  If yes: Event classification (right eye, left eye, right cortical, left cortical, verterobasilar, other):   If yes: Timing of event (intra-op, <6 hrs post-op, >=6 hrs post-op, unknown):   2. CN injury: No  If yes: CN  injuried   3. Myocardial infarction: No  If yes: Dx by (EKG or clinical, Troponin):   4.  CHF: No  5.  Dysrhythmia (new): No  6. Wound infection: No  7. Reperfusion symptoms: No  8. Return to OR: No  If yes: return to OR for (bleeding, neurologic, other CEA incision, other):   Discharge medications: Statin use:  Yes ASA use:  Yes   Beta blocker use:  No ACE-Inhibitor use:  No  ARB use:  No CCB use: No P2Y12 Antagonist use: No, [ ]  Plavix, [ ]  Plasugrel, [ ]  Ticlopinine, [ ]  Ticagrelor, [ ]  Other, [ ]  No for medical reason, [ ]  Non-compliant, [ ]  Not-indicated Anti-coagulant use:  Yes, [ ]  Warfarin, [ ]  Rivaroxaban, [ ]  Dabigatran,  Eliquis

## 2017-08-13 ENCOUNTER — Ambulatory Visit (INDEPENDENT_AMBULATORY_CARE_PROVIDER_SITE_OTHER): Payer: Medicare Other | Admitting: Vascular Surgery

## 2017-08-13 ENCOUNTER — Encounter: Payer: Self-pay | Admitting: Vascular Surgery

## 2017-08-13 VITALS — BP 109/68 | HR 51 | Temp 97.3°F | Ht 66.0 in | Wt 162.0 lb

## 2017-08-13 DIAGNOSIS — Z9889 Other specified postprocedural states: Secondary | ICD-10-CM

## 2017-08-13 NOTE — Progress Notes (Signed)
Patient name: Melanie Lucas MRN: [id number] DOB: [birth year] Sex: female  REASON FOR VISIT: Follow-up recent left carotid endarterectomy for symptomatic disease  HPI: Melanie Lucas is a 67 y.o. female here today for follow-up.  She underwent left carotid endarterectomize after a left brain stroke.  Her surgery was on 07/29/2017.  She did well and was discharged home on postoperative day #1.  Current Outpatient Medications  Medication Sig Dispense Refill  . albuterol (PROVENTIL HFA;VENTOLIN HFA) 108 (90 Base) MCG/ACT inhaler Inhale 2 puffs into the lungs every 6 (six) hours as needed for wheezing or shortness of breath.     [name] aspirin EC 81 MG tablet Take 1 tablet (81 mg total) by mouth daily. 30 tablet 1  . diazepam (VALIUM) 2 MG tablet Take 2 mg by mouth daily as needed (dizziness).     . dofetilide (TIKOSYN) 500 MCG capsule Take 1 capsule (500 mcg total) 2 (two) times daily by mouth. 180 capsule 2  . DULoxetine (CYMBALTA) 60 MG capsule Take 40 mg by mouth daily.     [name] ELIQUIS 5 MG TABS tablet TAKE 1 TABLET (5 MG TOTAL) BY MOUTH 2 (TWO) TIMES DAILY. 60 tablet 9  . Empagliflozin-metFORMIN HCl (SYNJARDY) 12.06-998 MG TABS Take 1 tablet by mouth 2 (two) times daily.    [name] enoxaparin (LOVENOX) 80 MG/0.8ML injection Inject 0.8 mLs (80 mg total) into the skin every 12 (twelve) hours. 5 Syringe 0  . furosemide (LASIX) 20 MG tablet Take 20 mg by mouth daily.     [name] ibuprofen (ADVIL,MOTRIN) 800 MG tablet Take 800 mg by mouth 3 (three) times daily as needed for mild pain.     . Multiple Vitamin (MULTIVITAMIN) tablet Take 1 tablet by mouth at bedtime.     . nicotine (NICODERM CQ - DOSED IN MG/24 HOURS) 21 mg/24hr patch Place 1 patch (21 mg total) onto the skin daily. 28 patch 0  . oxyCODONE-acetaminophen (PERCOCET/ROXICET) 5-325 MG tablet Take 1 tablet by mouth every 6 (six) hours as needed for moderate pain. 10 tablet 0  . Phenylephrine-Acetaminophen (SINUS HEADACHE PE MAX  ST) 5-325 MG TABS Take 1 tablet by mouth daily as needed (sinus headaches).    . rosuvastatin (CRESTOR) 5 MG tablet Take 5 mg by mouth at bedtime.     [name] VALTREX 500 MG tablet Take 500 mg by mouth 2 (two) times daily as needed (for outbreaks).     . zoledronic acid (RECLAST) 5 MG/100ML SOLN injection Inject 5 mg into the vein every 6 (six) months.      No current facility-administered medications for this visit.      PHYSICAL EXAM: Vitals:   08/13/17 1535  BP: 109/68  Pulse: (!) 51  Temp: (!) 97.3 F (36.3 C)  TempSrc: Oral  SpO2: 99%  Weight: 162 lb (73.5 kg)  Height: 5\' 6"  (1.676 m)    GENERAL: The patient is a well-nourished female, in no acute distress. The vital signs are documented above. Left neck incision is well-healed with the usual peri-incisional numbness.  Neurologically otherwise intact. Of note she does have chronic lower extremity lymphedema and has had superficial abrasion on her right pretibial area.  She does have easily palpable dorsalis pedis pulse  MEDICAL ISSUES: Stable following carotid endarterectomy.  Should she develop any wound issues or neurologic deficits.  Otherwise we will see her again in 6 months with repeat carotid duplex.  She does have known 50 to 60% asymptomatic right carotid stenosis and will continue  surveillance of this as well   Rosetta Posner, MD [facility] Tel [phone] Pager [phone]

## 2017-11-29 ENCOUNTER — Emergency Department (HOSPITAL_COMMUNITY)
Admission: EM | Admit: 2017-11-29 | Discharge: 2017-11-30 | Disposition: A | Discharge status: Home / Self Care | Payer: Medicare Other | Attending: Emergency Medicine | Admitting: Emergency Medicine

## 2017-11-29 ENCOUNTER — Emergency Department (HOSPITAL_COMMUNITY): Payer: Medicare Other

## 2017-11-29 DIAGNOSIS — Z79899 Other long term (current) drug therapy: Secondary | ICD-10-CM | POA: Diagnosis not present

## 2017-11-29 DIAGNOSIS — R51 Headache: Secondary | ICD-10-CM | POA: Insufficient documentation

## 2017-11-29 DIAGNOSIS — Z85828 Personal history of other malignant neoplasm of skin: Secondary | ICD-10-CM | POA: Insufficient documentation

## 2017-11-29 DIAGNOSIS — F17210 Nicotine dependence, cigarettes, uncomplicated: Secondary | ICD-10-CM | POA: Diagnosis not present

## 2017-11-29 DIAGNOSIS — I10 Essential (primary) hypertension: Secondary | ICD-10-CM | POA: Insufficient documentation

## 2017-11-29 DIAGNOSIS — Z7982 Long term (current) use of aspirin: Secondary | ICD-10-CM | POA: Insufficient documentation

## 2017-11-29 DIAGNOSIS — J449 Chronic obstructive pulmonary disease, unspecified: Secondary | ICD-10-CM | POA: Insufficient documentation

## 2017-11-29 DIAGNOSIS — I2510 Atherosclerotic heart disease of native coronary artery without angina pectoris: Secondary | ICD-10-CM | POA: Diagnosis not present

## 2017-11-29 DIAGNOSIS — E119 Type 2 diabetes mellitus without complications: Secondary | ICD-10-CM | POA: Insufficient documentation

## 2017-11-29 DIAGNOSIS — R4182 Altered mental status, unspecified: Secondary | ICD-10-CM | POA: Insufficient documentation

## 2017-11-29 DIAGNOSIS — Z7984 Long term (current) use of oral hypoglycemic drugs: Secondary | ICD-10-CM | POA: Insufficient documentation

## 2017-11-29 DIAGNOSIS — I252 Old myocardial infarction: Secondary | ICD-10-CM | POA: Diagnosis not present

## 2017-11-29 DIAGNOSIS — R519 Headache, unspecified: Secondary | ICD-10-CM

## 2017-11-29 LAB — I-STAT CHEM 8, ED
BUN: 8 mg/dL (ref 8–23)
CREATININE: 0.6 mg/dL (ref 0.44–1.00)
Calcium, Ion: 1.18 mmol/L (ref 1.15–1.40)
Chloride: 99 mmol/L (ref 98–111)
GLUCOSE: 263 mg/dL — AB (ref 70–99)
HCT: 48 % — ABNORMAL HIGH (ref 36.0–46.0)
HEMOGLOBIN: 16.3 g/dL — AB (ref 12.0–15.0)
Potassium: 3.3 mmol/L — ABNORMAL LOW (ref 3.5–5.1)
Sodium: 139 mmol/L (ref 135–145)
TCO2: 29 mmol/L (ref 22–32)

## 2017-11-29 LAB — COMPREHENSIVE METABOLIC PANEL
ALT: 19 U/L (ref 0–44)
AST: 17 U/L (ref 15–41)
Albumin: 3.6 g/dL (ref 3.5–5.0)
Alkaline Phosphatase: 77 U/L (ref 38–126)
Anion gap: 10 (ref 5–15)
BILIRUBIN TOTAL: 0.7 mg/dL (ref 0.3–1.2)
BUN: 7 mg/dL — AB (ref 8–23)
CO2: 28 mmol/L (ref 22–32)
CREATININE: 0.68 mg/dL (ref 0.44–1.00)
Calcium: 9.3 mg/dL (ref 8.9–10.3)
Chloride: 100 mmol/L (ref 98–111)
Glucose, Bld: 272 mg/dL — ABNORMAL HIGH (ref 70–99)
POTASSIUM: 3.6 mmol/L (ref 3.5–5.1)
Sodium: 138 mmol/L (ref 135–145)
TOTAL PROTEIN: 6.1 g/dL — AB (ref 6.5–8.1)

## 2017-11-29 LAB — DIFFERENTIAL
ABS IMMATURE GRANULOCYTES: 0.02 10*3/uL (ref 0.00–0.07)
Basophils Absolute: 0 10*3/uL (ref 0.0–0.1)
Basophils Relative: 1 %
EOS ABS: 0.2 10*3/uL (ref 0.0–0.5)
Eosinophils Relative: 3 %
Immature Granulocytes: 0 %
LYMPHS ABS: 2.3 10*3/uL (ref 0.7–4.0)
Lymphocytes Relative: 31 %
MONO ABS: 0.6 10*3/uL (ref 0.1–1.0)
MONOS PCT: 8 %
Neutro Abs: 4.3 10*3/uL (ref 1.7–7.7)
Neutrophils Relative %: 57 %

## 2017-11-29 LAB — PROTIME-INR
INR: 1.04
PROTHROMBIN TIME: 13.5 s (ref 11.4–15.2)

## 2017-11-29 LAB — I-STAT TROPONIN, ED: TROPONIN I, POC: 0.01 ng/mL (ref 0.00–0.08)

## 2017-11-29 LAB — CBC
HCT: 50.5 % — ABNORMAL HIGH (ref 36.0–46.0)
Hemoglobin: 16.2 g/dL — ABNORMAL HIGH (ref 12.0–15.0)
MCH: 30 pg (ref 26.0–34.0)
MCHC: 32.1 g/dL (ref 30.0–36.0)
MCV: 93.5 fL (ref 80.0–100.0)
PLATELETS: 184 10*3/uL (ref 150–400)
RBC: 5.4 MIL/uL — AB (ref 3.87–5.11)
RDW: 12.7 % (ref 11.5–15.5)
WBC: 7.4 10*3/uL (ref 4.0–10.5)
nRBC: 0 % (ref 0.0–0.2)

## 2017-11-29 LAB — APTT: APTT: 31 s (ref 24–36)

## 2017-11-29 MED ORDER — KETOROLAC TROMETHAMINE 15 MG/ML IJ SOLN
15.0000 mg | Freq: Once | INTRAMUSCULAR | Status: AC
  Administered 2017-11-30: 15 mg via INTRAVENOUS
  Filled 2017-11-29: qty 1

## 2017-11-29 MED ORDER — METOCLOPRAMIDE HCL 5 MG/ML IJ SOLN
10.0000 mg | Freq: Once | INTRAMUSCULAR | Status: AC
  Administered 2017-11-30: 10 mg via INTRAVENOUS
  Filled 2017-11-29: qty 2

## 2017-11-29 MED ORDER — DIPHENHYDRAMINE HCL 50 MG/ML IJ SOLN
25.0000 mg | Freq: Once | INTRAMUSCULAR | Status: AC
  Administered 2017-11-30: 25 mg via INTRAVENOUS
  Filled 2017-11-29: qty 1

## 2017-11-29 MED ORDER — SODIUM CHLORIDE 0.9 % IV BOLUS
1000.0000 mL | Freq: Once | INTRAVENOUS | Status: AC
  Administered 2017-11-30: 1000 mL via INTRAVENOUS

## 2017-11-29 NOTE — ED Notes (Signed)
Pt arrives with c/o "stroke symptoms".  Reports headache and nausea since 9am and feeling "off".  VAN negative.

## 2017-11-29 NOTE — ED Triage Notes (Signed)
Pt complains that she is "a little off... Actually a lot off.  I'm feeling really stupid, can't figure out how to dial a phone number.  Reports a headache right behind her right eye.  Negative neural deficits at this time.  Pt went to bed around 2pm, no problems, found that "I was stupid when I woke up around 9am."  Pt states she was placed on two new medications this week.

## 2017-11-29 NOTE — ED Provider Notes (Signed)
[facility] EMERGENCY DEPARTMENT Provider Note   CSN: [id number] Arrival date & time: 11/29/17  2137     History   Chief Complaint Chief Complaint  Patient presents with  . Altered Mental Status  . Headache    HPI Melanie Lucas is a 67 y.o. female.  Patient presents to the emergency department with a chief complaint of headache.  She states that she woke up with a bad headache this morning.  She reports a history of the same.  States that she had some confusion earlier this morning, also states that she lost a pair of shoes, and that she had some difficulty working her phone.  She states that occasionally this happens, and she generally sleeps off her headache, and feels better the next day.  She denies any fevers or chills.  Denies any neck stiffness.  Denies any cough, shortness of breath, or chest pain.  Denies any numbness, weakness, or tingling in her extremities.  Denies any vision changes or slurred speech.  She does have a history of TIAs, but takes Eliquis.  Denies any other associated complaints tonight.  The history is provided by the patient. No language interpreter was used.    Past Medical History:  Diagnosis Date  . Anxiety   . Cancer (HCC)    Skin- leg   . Chronic acquired lymphedema    a. R>L;  b. 03/2012 Neg LE U/S for DVT. Legs since age 26  . Complication of anesthesia    woke up during colonoscopy, Lithrostrixpy, Biospy  . COPD (chronic obstructive pulmonary disease) ([place])   . Coronary artery disease   . CVD (cardiovascular disease)   . Deaf, right   . Diabetes mellitus    Type II  . Diabetic neuropathy ([place])   . Dysrhythmia    afib  . Elevated TSH    a. 10/2012 - inst to f/u PCP.  . Full dentures   . Hematuria    a. while on pradaxa,  she reports that she has seen Dr Margie Ege and had low risk cystoscopy  . History of kidney stones   . Hyperlipidemia   . Hypertension    patient denies  . Incontinence    prior to urinating  .  Lacunar infarction ([place])    a. 02/2009 non-acute Lacunar infarct of the right thalamus noted on MRI of brain.  . Left carotid stenosis   . Lymphedema of extremity    had this problem, since she was a teenager. especially seen in R LE  . Meniere disease   . Myocardial infarction ([place])   . Obesity   . Osteoarthritis    cervical & lumbar region, knees, hands cramp also   . PAF with post-termination pauses    a. on dronedarone;  b. CHA2DS2VASc = 5 (HTN, DM, h/o lacunar infarct on MRI, Female) ->refused oral anticoagulation after h/o hematuria on pradaxa;  c. 02/2012 Echo: EF 55-60%, mildly dil LA. D. Recurrent PAF 10/2012 after only taking Multaq 1x/day - spont conv to NSR, placed back on BID Multaq/eliquis;  e. 10/2014 Multaq d/c'd->tikosyn initiated.  . Shortness of breath dyspnea    with exertion  . Stroke [facility])    2 mini strokes  . Tobacco abuse     Patient Active Problem List   Diagnosis Date Noted  . Left carotid artery stenosis 07/29/2017  . TIA (transient ischemic attack) 06/24/2017  . Acute ischemic stroke ([place]) 06/23/2017  . Cervical spondylosis with myelopathy 02/21/2016  .  Bilateral leg edema 11/24/2014  . Cervical stenosis of spine 11/24/2014  . Numbness of fingers of both hands 11/24/2014  . HLD (hyperlipidemia) 11/09/2014  . Diabetes mellitus ([place]) 11/09/2014  . Pulmonary hypertension ([place]) 08/07/2013  . CAD (coronary artery disease) 03/11/2013  . Peripheral edema 02/25/2012  . Essential hypertension, benign 05/06/2009  . Overweight 04/13/2009  . TOBACCO ABUSE 04/13/2009  . A-fib ([place]) 04/13/2009  . CHEST PAIN, ATYPICAL 04/13/2009    Past Surgical History:  Procedure Laterality Date  . ANTERIOR CERVICAL CORPECTOMY N/A 02/21/2016   Procedure: ANTERIOR CERVICAL CORPECTOMY ANS FUSION CERVICAL SIX , ANTERIOR PLATING CERVICAL FIVE-SEVEN;  Surgeon: Consuella Lose, MD;  Location: [place];  Service: Neurosurgery;  Laterality: N/A;  . ANTERIOR FUSION CERVICAL SPINE   02/21/2016  . Biopsy of adrenal glands    . BREAST BIOPSY Bilateral    benign results, both breasts   . CARDIAC CATHETERIZATION  2014   x1 stent placed, done in [place]   . CARDIOVERSION N/A 11/11/2014   Procedure: CARDIOVERSION;  Surgeon: Larey Dresser, MD;  Location: [place];  Service: Cardiovascular;  Laterality: N/A;  . CAROTID ENDARTERECTOMY Left 07/29/2017  . COLONOSCOPY W/ POLYPECTOMY    . CORONARY STENT PLACEMENT    . ENDARTERECTOMY Left 07/29/2017   Procedure: ENDARTERECTOMY CAROTID LEFT;  Surgeon: Rosetta Posner, MD;  Location: [place];  Service: Vascular;  Laterality: Left;  . PATCH ANGIOPLASTY Left 07/29/2017   Procedure: PATCH ANGIOPLASTY USING HEMASHIELD GOLD VASCULAR PATCH;  Surgeon: Rosetta Posner, MD;  Location: [place];  Service: Vascular;  Laterality: Left;  . TEE WITHOUT CARDIOVERSION N/A 11/11/2014   Procedure: TRANSESOPHAGEAL ECHOCARDIOGRAM (TEE);  Surgeon: Larey Dresser, MD;  Location: [place];  Service: Cardiovascular;  Laterality: N/A;     OB History   None      Home Medications    Prior to Admission medications   Medication Sig Start Date End Date Taking? Authorizing Provider  albuterol (PROVENTIL HFA;VENTOLIN HFA) 108 (90 Base) MCG/ACT inhaler Inhale 2 puffs into the lungs every 6 (six) hours as needed for wheezing or shortness of breath.     [provider]  aspirin EC 81 MG tablet Take 1 tablet (81 mg total) by mouth daily. 06/24/17   Caren Griffins, MD  diazepam (VALIUM) 2 MG tablet Take 2 mg by mouth daily as needed (dizziness).  09/02/12   [provider]  dofetilide (TIKOSYN) 500 MCG capsule Take 1 capsule (500 mcg total) 2 (two) times daily by mouth. 12/28/16   Allred, Jeneen Rinks, MD  DULoxetine (CYMBALTA) 60 MG capsule Take 40 mg by mouth daily.     [provider]  ELIQUIS 5 MG TABS tablet TAKE 1 TABLET (5 MG TOTAL) BY MOUTH 2 (TWO) TIMES DAILY. 03/14/17   Allred, Jeneen Rinks, MD  Empagliflozin-metFORMIN HCl (SYNJARDY)  12.06-998 MG TABS Take 1 tablet by mouth 2 (two) times daily.    [provider]  enoxaparin (LOVENOX) 80 MG/0.8ML injection Inject 0.8 mLs (80 mg total) into the skin every 12 (twelve) hours. 07/25/17   Dorothy Spark, MD  furosemide (LASIX) 20 MG tablet Take 20 mg by mouth daily.     [provider]  ibuprofen (ADVIL,MOTRIN) 800 MG tablet Take 800 mg by mouth 3 (three) times daily as needed for mild pain.     [provider]  Multiple Vitamin (MULTIVITAMIN) tablet Take 1 tablet by mouth at bedtime.     [provider]  nicotine (NICODERM CQ -  DOSED IN MG/24 HOURS) 21 mg/24hr patch Place 1 patch (21 mg total) onto the skin daily. 06/25/17   Caren Griffins, MD  oxyCODONE-acetaminophen (PERCOCET/ROXICET) 5-325 MG tablet Take 1 tablet by mouth every 6 (six) hours as needed for moderate pain. 07/30/17   Dagoberto Ligas, PA-C  Phenylephrine-Acetaminophen (SINUS HEADACHE PE MAX ST) 5-325 MG TABS Take 1 tablet by mouth daily as needed (sinus headaches).    [provider]  rosuvastatin (CRESTOR) 5 MG tablet Take 5 mg by mouth at bedtime.     [provider]  VALTREX 500 MG tablet Take 500 mg by mouth 2 (two) times daily as needed (for outbreaks).  02/19/13   [provider]  zoledronic acid (RECLAST) 5 MG/100ML SOLN injection Inject 5 mg into the vein every 6 (six) months.     [provider]    Family History Family History  Problem Relation Age of Onset  . Heart attack Father   . Coronary artery disease Father        strong family hx  . Bladder Cancer Mother        bladder  . Leukemia Brother   . Breast cancer Maternal Aunt   . Breast cancer Paternal Aunt   . Breast cancer Maternal Aunt     Social History Social History   Tobacco Use  . Smoking status: Current Every Day Smoker    Packs/day: 1.00    Years: 55.00    Pack years: 55.00    Types: Cigarettes  . Smokeless tobacco: Never Used  . Tobacco comment: pt has  patches   Substance Use Topics  . Alcohol use: Yes    Comment: 1 beer per month  . Drug use: No     Allergies   Avelox [moxifloxacin]; Chantix [varenicline]; Fosamax [alendronate]; Pradaxa [dabigatran etexilate mesylate]; Statins; Sulfamethoxazole; Sulfonamide derivatives; Ciprofloxacin; Gabapentin; and Tape   Review of Systems Review of Systems  All other systems reviewed and are negative.    Physical Exam Updated Vital Signs BP (!) 191/81 (BP Location: Right Arm)   Pulse 61   Temp 98.2 F (36.8 C) (Oral)   Resp 16   Ht 5\' 7"  (1.702 m)   Wt 73.9 kg   SpO2 100%   BMI 25.53 kg/m   Physical Exam  Constitutional: She is oriented to person, place, and time. She appears well-developed and well-nourished.  HENT:  Head: Normocephalic and atraumatic.  Right Ear: External ear normal.  Left Ear: External ear normal.  Eyes: Pupils are equal, round, and reactive to light. Conjunctivae and EOM are normal.  Neck: Normal range of motion. Neck supple.  No pain with neck flexion, no meningismus  Cardiovascular: Normal rate, regular rhythm and normal heart sounds. Exam reveals no gallop and no friction rub.  No murmur heard. Pulmonary/Chest: Effort normal and breath sounds normal. No respiratory distress. She has no wheezes. She has no rales. She exhibits no tenderness.  Abdominal: Soft. She exhibits no distension and no mass. There is no tenderness. There is no rebound and no guarding.  Musculoskeletal: Normal range of motion. She exhibits no edema or tenderness.  Normal gait.  Neurological: She is alert and oriented to person, place, and time. She has normal reflexes.  CN 3-12 intact, normal finger to nose, no pronator drift, sensation and strength intact bilaterally.  Skin: Skin is warm and dry.  Psychiatric: She has a normal mood and affect. Her behavior is normal. Judgment and thought content normal.  Nursing note and  vitals reviewed.    ED Treatments / Results  Labs (all  labs ordered are listed, but only abnormal results are displayed) Labs Reviewed  CBC - Abnormal; Notable for the following components:      Result Value   RBC 5.40 (*)    Hemoglobin 16.2 (*)    HCT 50.5 (*)    All other components within normal limits  COMPREHENSIVE METABOLIC PANEL - Abnormal; Notable for the following components:   Glucose, Bld 272 (*)    BUN 7 (*)    Total Protein 6.1 (*)    All other components within normal limits  I-STAT CHEM 8, ED - Abnormal; Notable for the following components:   Potassium 3.3 (*)    Glucose, Bld 263 (*)    Hemoglobin 16.3 (*)    HCT 48.0 (*)    All other components within normal limits  PROTIME-INR  APTT  DIFFERENTIAL  I-STAT TROPONIN, ED  CBG MONITORING, ED    EKG None  Radiology Ct Head Wo Contrast  Result Date: 11/29/2017 CLINICAL DATA:  Headache and memory loss. EXAM: CT HEAD WITHOUT CONTRAST TECHNIQUE: Contiguous axial images were obtained from the base of the skull through the vertex without intravenous contrast. COMPARISON:  CTA head neck 06/23/2017 FINDINGS: Brain: There is no mass, hemorrhage or extra-axial collection. There is generalized atrophy without lobar predilection. There is an old right thalamic infarct. Hypoattenuation in the bilateral centrum semiovale also likely old infarcts. No acute infarction is evident. There is hypoattenuation of the periventricular white matter, most commonly indicating chronic ischemic microangiopathy. Vascular: Atherosclerotic calcification of the internal carotid arteries at the skull base. No abnormal hyperdensity of the major intracranial arteries or dural venous sinuses. Skull: The visualized skull base, calvarium and extracranial soft tissues are normal. Sinuses/Orbits: No fluid levels or advanced mucosal thickening of the visualized paranasal sinuses. No mastoid or middle ear effusion. The orbits are normal. IMPRESSION: Chronic ischemic microangiopathy without acute intracranial  abnormality. Electronically Signed   By: Ulyses Jarred M.D.   On: 11/29/2017 22:58    Procedures Procedures (including critical care time)  Medications Ordered in ED Medications - No data to display   Initial Impression / Assessment and Plan / ED Course  I have reviewed the triage vital signs and the nursing notes.  Pertinent labs & imaging results that were available during my care of the patient were reviewed by me and considered in my medical decision making (see chart for details).    Patient with headache.  Complained of having some confusion, and some difficulty working her phone and also having lost her shoes earlier today.  She states that sometimes things like this happens to her when she has bad headache, but she is normally able to sleep it off.  Laboratory work-up ordered in triage shows at or near baseline for patient.  No concerning findings tonight.  CT head ordered in triage shows chronic changes, but no acute process.  Patient is neurovascularly intact on my exam.  I doubt stroke or TIA.  Patient given headache cocktail with resolution of her headache.  Patient seen by discussed with Dr. Ellender Hose, who recommends neurology follow-up.  Final Clinical Impressions(s) / ED Diagnoses   Final diagnoses:  Acute nonintractable headache, unspecified headache type    ED Discharge Orders    None       Montine Circle, PA-C 11/30/17 [id number]    Duffy Bruce, MD 12/01/17 [phone]

## 2017-11-30 NOTE — Discharge Instructions (Addendum)
Please follow-up with your neurologist.  Your head CT and blood tests do not reveal an acute causes of your headache.

## 2017-11-30 NOTE — ED Notes (Signed)
Patient ambulated to the bathroom with assistance. Steady gait noted.

## 2017-11-30 NOTE — ED Notes (Signed)
Patient verbalizes understanding of medications and discharge instructions. No further questions at this time. VSS and patient ambulatory at discharge.   

## 2017-12-30 ENCOUNTER — Emergency Department (HOSPITAL_COMMUNITY): Payer: Medicare Other

## 2017-12-30 ENCOUNTER — Encounter (HOSPITAL_COMMUNITY): Payer: Self-pay

## 2017-12-30 ENCOUNTER — Inpatient Hospital Stay (HOSPITAL_COMMUNITY)
Admission: EM | Admit: 2017-12-30 | Discharge: 2018-01-01 | DRG: 064 | Disposition: A | Discharge status: Home / Self Care | Payer: Medicare Other | Attending: Internal Medicine | Admitting: Internal Medicine

## 2017-12-30 DIAGNOSIS — Z888 Allergy status to other drugs, medicaments and biological substances status: Secondary | ICD-10-CM

## 2017-12-30 DIAGNOSIS — I4891 Unspecified atrial fibrillation: Secondary | ICD-10-CM | POA: Diagnosis present

## 2017-12-30 DIAGNOSIS — Z7901 Long term (current) use of anticoagulants: Secondary | ICD-10-CM

## 2017-12-30 DIAGNOSIS — Z791 Long term (current) use of non-steroidal anti-inflammatories (NSAID): Secondary | ICD-10-CM

## 2017-12-30 DIAGNOSIS — Z7982 Long term (current) use of aspirin: Secondary | ICD-10-CM

## 2017-12-30 DIAGNOSIS — I5030 Unspecified diastolic (congestive) heart failure: Secondary | ICD-10-CM | POA: Diagnosis present

## 2017-12-30 DIAGNOSIS — R29700 NIHSS score 0: Secondary | ICD-10-CM | POA: Diagnosis present

## 2017-12-30 DIAGNOSIS — Z8673 Personal history of transient ischemic attack (TIA), and cerebral infarction without residual deficits: Secondary | ICD-10-CM

## 2017-12-30 DIAGNOSIS — I890 Lymphedema, not elsewhere classified: Secondary | ICD-10-CM | POA: Diagnosis present

## 2017-12-30 DIAGNOSIS — Z6825 Body mass index (BMI) 25.0-25.9, adult: Secondary | ICD-10-CM

## 2017-12-30 DIAGNOSIS — J449 Chronic obstructive pulmonary disease, unspecified: Secondary | ICD-10-CM | POA: Diagnosis present

## 2017-12-30 DIAGNOSIS — R471 Dysarthria and anarthria: Secondary | ICD-10-CM | POA: Diagnosis present

## 2017-12-30 DIAGNOSIS — F17200 Nicotine dependence, unspecified, uncomplicated: Secondary | ICD-10-CM | POA: Diagnosis present

## 2017-12-30 DIAGNOSIS — H9191 Unspecified hearing loss, right ear: Secondary | ICD-10-CM | POA: Diagnosis present

## 2017-12-30 DIAGNOSIS — F329 Major depressive disorder, single episode, unspecified: Secondary | ICD-10-CM | POA: Diagnosis present

## 2017-12-30 DIAGNOSIS — F32A Depression, unspecified: Secondary | ICD-10-CM | POA: Diagnosis present

## 2017-12-30 DIAGNOSIS — E1151 Type 2 diabetes mellitus with diabetic peripheral angiopathy without gangrene: Secondary | ICD-10-CM | POA: Diagnosis present

## 2017-12-30 DIAGNOSIS — I480 Paroxysmal atrial fibrillation: Secondary | ICD-10-CM | POA: Diagnosis not present

## 2017-12-30 DIAGNOSIS — I10 Essential (primary) hypertension: Secondary | ICD-10-CM

## 2017-12-30 DIAGNOSIS — I252 Old myocardial infarction: Secondary | ICD-10-CM

## 2017-12-30 DIAGNOSIS — F419 Anxiety disorder, unspecified: Secondary | ICD-10-CM | POA: Diagnosis present

## 2017-12-30 DIAGNOSIS — Z79899 Other long term (current) drug therapy: Secondary | ICD-10-CM

## 2017-12-30 DIAGNOSIS — I482 Chronic atrial fibrillation, unspecified: Secondary | ICD-10-CM | POA: Diagnosis present

## 2017-12-30 DIAGNOSIS — F17210 Nicotine dependence, cigarettes, uncomplicated: Secondary | ICD-10-CM | POA: Diagnosis present

## 2017-12-30 DIAGNOSIS — Z85828 Personal history of other malignant neoplasm of skin: Secondary | ICD-10-CM

## 2017-12-30 DIAGNOSIS — Z79891 Long term (current) use of opiate analgesic: Secondary | ICD-10-CM

## 2017-12-30 DIAGNOSIS — G9341 Metabolic encephalopathy: Secondary | ICD-10-CM | POA: Diagnosis not present

## 2017-12-30 DIAGNOSIS — E782 Mixed hyperlipidemia: Secondary | ICD-10-CM | POA: Diagnosis present

## 2017-12-30 DIAGNOSIS — E119 Type 2 diabetes mellitus without complications: Secondary | ICD-10-CM

## 2017-12-30 DIAGNOSIS — I110 Hypertensive heart disease with heart failure: Secondary | ICD-10-CM | POA: Diagnosis present

## 2017-12-30 DIAGNOSIS — Z8052 Family history of malignant neoplasm of bladder: Secondary | ICD-10-CM

## 2017-12-30 DIAGNOSIS — Z981 Arthrodesis status: Secondary | ICD-10-CM

## 2017-12-30 DIAGNOSIS — Z8249 Family history of ischemic heart disease and other diseases of the circulatory system: Secondary | ICD-10-CM

## 2017-12-30 DIAGNOSIS — R600 Localized edema: Secondary | ICD-10-CM | POA: Diagnosis not present

## 2017-12-30 DIAGNOSIS — E785 Hyperlipidemia, unspecified: Secondary | ICD-10-CM | POA: Diagnosis present

## 2017-12-30 DIAGNOSIS — R29701 NIHSS score 1: Secondary | ICD-10-CM | POA: Diagnosis not present

## 2017-12-30 DIAGNOSIS — I6389 Other cerebral infarction: Secondary | ICD-10-CM | POA: Diagnosis not present

## 2017-12-30 DIAGNOSIS — Z716 Tobacco abuse counseling: Secondary | ICD-10-CM

## 2017-12-30 DIAGNOSIS — I272 Pulmonary hypertension, unspecified: Secondary | ICD-10-CM | POA: Diagnosis present

## 2017-12-30 DIAGNOSIS — I639 Cerebral infarction, unspecified: Secondary | ICD-10-CM | POA: Diagnosis not present

## 2017-12-30 DIAGNOSIS — E669 Obesity, unspecified: Secondary | ICD-10-CM | POA: Diagnosis present

## 2017-12-30 DIAGNOSIS — Z882 Allergy status to sulfonamides status: Secondary | ICD-10-CM

## 2017-12-30 DIAGNOSIS — Z87442 Personal history of urinary calculi: Secondary | ICD-10-CM

## 2017-12-30 DIAGNOSIS — E1169 Type 2 diabetes mellitus with other specified complication: Secondary | ICD-10-CM | POA: Diagnosis present

## 2017-12-30 DIAGNOSIS — R262 Difficulty in walking, not elsewhere classified: Secondary | ICD-10-CM | POA: Diagnosis present

## 2017-12-30 DIAGNOSIS — E1140 Type 2 diabetes mellitus with diabetic neuropathy, unspecified: Secondary | ICD-10-CM | POA: Diagnosis present

## 2017-12-30 DIAGNOSIS — Z803 Family history of malignant neoplasm of breast: Secondary | ICD-10-CM

## 2017-12-30 DIAGNOSIS — Z91048 Other nonmedicinal substance allergy status: Secondary | ICD-10-CM

## 2017-12-30 DIAGNOSIS — I2510 Atherosclerotic heart disease of native coronary artery without angina pectoris: Secondary | ICD-10-CM | POA: Diagnosis present

## 2017-12-30 DIAGNOSIS — Z806 Family history of leukemia: Secondary | ICD-10-CM

## 2017-12-30 LAB — COMPREHENSIVE METABOLIC PANEL
ALBUMIN: 3.4 g/dL — AB (ref 3.5–5.0)
ALT: 17 U/L (ref 0–44)
AST: 19 U/L (ref 15–41)
Alkaline Phosphatase: 82 U/L (ref 38–126)
Anion gap: 8 (ref 5–15)
BUN: 8 mg/dL (ref 8–23)
CHLORIDE: 104 mmol/L (ref 98–111)
CO2: 28 mmol/L (ref 22–32)
CREATININE: 0.71 mg/dL (ref 0.44–1.00)
Calcium: 8.9 mg/dL (ref 8.9–10.3)
GFR calc Af Amer: >60 mL/min (ref >60)
GLUCOSE: 269 mg/dL — AB (ref 70–99)
POTASSIUM: 3.4 mmol/L — AB (ref 3.5–5.1)
Sodium: 140 mmol/L (ref 135–145)
Total Bilirubin: 0.8 mg/dL (ref 0.3–1.2)
Total Protein: 5.9 g/dL — ABNORMAL LOW (ref 6.5–8.1)

## 2017-12-30 LAB — DIFFERENTIAL
ABS IMMATURE GRANULOCYTES: 0.02 10*3/uL (ref 0.00–0.07)
BASOS PCT: 1 %
Basophils Absolute: 0.1 10*3/uL (ref 0.0–0.1)
Eosinophils Absolute: 0.1 10*3/uL (ref 0.0–0.5)
Eosinophils Relative: 2 %
IMMATURE GRANULOCYTES: 0 %
Lymphocytes Relative: 26 %
Lymphs Abs: 1.7 10*3/uL (ref 0.7–4.0)
MONOS PCT: 6 %
Monocytes Absolute: 0.4 10*3/uL (ref 0.1–1.0)
NEUTROS ABS: 4.3 10*3/uL (ref 1.7–7.7)
Neutrophils Relative %: 65 %

## 2017-12-30 LAB — RAPID URINE DRUG SCREEN, HOSP PERFORMED
AMPHETAMINES: NOT DETECTED
BARBITURATES: NOT DETECTED
BENZODIAZEPINES: NOT DETECTED
COCAINE: NOT DETECTED
Opiates: NOT DETECTED
TETRAHYDROCANNABINOL: NOT DETECTED

## 2017-12-30 LAB — I-STAT CHEM 8, ED
BUN: 9 mg/dL (ref 8–23)
CALCIUM ION: 1.14 mmol/L — AB (ref 1.15–1.40)
CREATININE: 0.6 mg/dL (ref 0.44–1.00)
Chloride: 103 mmol/L (ref 98–111)
GLUCOSE: 274 mg/dL — AB (ref 70–99)
HCT: 46 % (ref 36.0–46.0)
HEMOGLOBIN: 15.6 g/dL — AB (ref 12.0–15.0)
Potassium: 3.5 mmol/L (ref 3.5–5.1)
Sodium: 140 mmol/L (ref 135–145)
TCO2: 29 mmol/L (ref 22–32)

## 2017-12-30 LAB — CBC
HCT: 47.4 % — ABNORMAL HIGH (ref 36.0–46.0)
HEMOGLOBIN: 16 g/dL — AB (ref 12.0–15.0)
MCH: 30.9 pg (ref 26.0–34.0)
MCHC: 33.8 g/dL (ref 30.0–36.0)
MCV: 91.7 fL (ref 80.0–100.0)
NRBC: 0 % (ref 0.0–0.2)
Platelets: 174 10*3/uL (ref 150–400)
RBC: 5.17 MIL/uL — AB (ref 3.87–5.11)
RDW: 12.3 % (ref 11.5–15.5)
WBC: 6.6 10*3/uL (ref 4.0–10.5)

## 2017-12-30 LAB — URINALYSIS, ROUTINE W REFLEX MICROSCOPIC
Bilirubin Urine: NEGATIVE
Hgb urine dipstick: NEGATIVE
Ketones, ur: 5 mg/dL — AB
Leukocytes, UA: NEGATIVE
Nitrite: NEGATIVE
PH: 6 (ref 5.0–8.0)
Protein, ur: NEGATIVE mg/dL
SPECIFIC GRAVITY, URINE: 1.038 — AB (ref 1.005–1.030)

## 2017-12-30 LAB — CBG MONITORING, ED: Glucose-Capillary: 262 mg/dL — ABNORMAL HIGH (ref 70–99)

## 2017-12-30 LAB — I-STAT TROPONIN, ED: TROPONIN I, POC: 0.01 ng/mL (ref 0.00–0.08)

## 2017-12-30 LAB — PROTIME-INR
INR: 1.05
Prothrombin Time: 13.6 seconds (ref 11.4–15.2)

## 2017-12-30 LAB — ETHANOL: Alcohol, Ethyl (B): <10 mg/dL (ref <10)

## 2017-12-30 LAB — APTT: aPTT: 31 seconds (ref 24–36)

## 2017-12-30 MED ORDER — IOPAMIDOL (ISOVUE-370) INJECTION 76%
INTRAVENOUS | Status: AC
  Filled 2017-12-30: qty 100

## 2017-12-30 MED ORDER — IOPAMIDOL (ISOVUE-370) INJECTION 76%
75.0000 mL | Freq: Once | INTRAVENOUS | Status: AC | PRN
  Administered 2017-12-30: 75 mL via INTRAVENOUS

## 2017-12-30 MED ORDER — PROCHLORPERAZINE EDISYLATE 10 MG/2ML IJ SOLN
10.0000 mg | Freq: Once | INTRAMUSCULAR | Status: AC
  Administered 2017-12-30: 10 mg via INTRAVENOUS
  Filled 2017-12-30: qty 2

## 2017-12-30 MED ORDER — SODIUM CHLORIDE 0.9 % IV BOLUS
500.0000 mL | Freq: Once | INTRAVENOUS | Status: AC
  Administered 2017-12-30: 500 mL via INTRAVENOUS

## 2017-12-30 MED ORDER — DIPHENHYDRAMINE HCL 50 MG/ML IJ SOLN
25.0000 mg | Freq: Once | INTRAMUSCULAR | Status: AC
  Administered 2017-12-30: 25 mg via INTRAVENOUS
  Filled 2017-12-30: qty 1

## 2017-12-30 NOTE — ED Notes (Signed)
Patient transported to CT 

## 2017-12-30 NOTE — H&P (Signed)
History and Physical    Melanie Lucas UDJ:[id number] DOB: [birth year] DOA: 12/30/2017  Referring MD/NP/PA:   PCP: Dineen Kid, MD   Patient coming from:  The patient is coming from home.  At baseline, pt is independent for most of ADL.        Chief Complaint: unsteady gait, difficulty walking, difficulty speaking,  HPI: Melanie Lucas is a 67 y.o. female with medical history significant of hypertension, hyperlipidemia, diabetes mellitus, COPD, lymphedema in both legs, CAD, PAF on Eliquis, tobacco abuse, dCHF, depression, anxiety, who presents with unsteady gait, difficulty walking, difficulty speaking.  Pt states that has been having difficulty walking in the past 3 days.  She was reportedly to have unsteady gait, and difficult speaking words out. She also had headache.  Patient has mild confusion, but is still oriented x3 currenlty.  She states that she has right ear hearing loss due to history of Mnire's disease, which has not changed.  No vision loss.  Patient states that has chronic hand tingling sometimes, which is a chronic issue, no change recently.  He denies chest pain, shortness breath, cough, nausea, vomiting, diarrhea, abdominal pain, symptoms of UTI.  ED Course: pt was found to have WBC 6.6, negative urinalysis, INR 1.05, negative troponin, negative UDS, electrolytes renal function okay, temperature normal, oxygen saturation 96% on room air. MRI of brain showed acute right corona radiata infarct. Pt is placed on tele bed for obs. CTA of neck and head showed no LVO. Neurology, Dr. Leonel Ramsay was consulted.  CTA-head and neck: 1. Negative CTA for emergent large vessel occlusion. 2. Postoperative changes from interval left carotid endarterectomy without adverse features. No significant residual or recurrent stenosis identified. 3. Atheromatous stenosis of approximately 50% at the right carotid bifurcation, stable. 4. Moderate atherosclerotic change throughout the carotid siphons  without high-grade stenosis, similar to previous. No other high-grade or correctable stenosis identified.  Review of Systems:   General: no fevers, chills, no body weight gain, has fatigue HEENT: no blurry vision, hearing changes or sore throat Respiratory: no dyspnea, coughing, wheezing CV: no chest pain, no palpitations GI: no nausea, vomiting, abdominal pain, diarrhea, constipation GU: no dysuria, burning on urination, increased urinary frequency, hematuria  Ext: no leg edema Neuro: has new unsteady gait, difficulty walking, difficulty speaking. Has chronic right ear hearing loss, and chronic bilateral hand tingling. Has mild confusion. Skin: no rash, no skin tear. MSK: No muscle spasm, no deformity, no limitation of range of movement in spin Heme: No easy bruising.  Travel history: No recent long distant travel.  Allergy:  Allergies  Allergen Reactions  . Avelox [Moxifloxacin]     Unknown reaction   . Chantix [Varenicline]     nightmares  . Fosamax [Alendronate]     Pain all over  . Pradaxa [Dabigatran Etexilate Mesylate]     Extreme bleeding  . Statins Nausea And Vomiting and Other (See Comments)    Muscle pain, Dizziness (intolerance)  . Sulfamethoxazole Nausea And Vomiting    Dizziness (intolerance)  . Sulfonamide Derivatives Hives    Nausea vertigo  . Ciprofloxacin Rash  . Gabapentin Rash    burning  . Tape Itching and Rash    Please use "paper" tape only    Past Medical History:  Diagnosis Date  . Anxiety   . Cancer (HCC)    Skin- leg   . Chronic acquired lymphedema    a. R>L;  b. 03/2012 Neg LE U/S for DVT. Legs since age 17  .  Complication of anesthesia    woke up during colonoscopy, Lithrostrixpy, Biospy  . COPD (chronic obstructive pulmonary disease) ([place])   . Coronary artery disease   . CVD (cardiovascular disease)   . Deaf, right   . Diabetes mellitus    Type II  . Diabetic neuropathy ([place])   . Dysrhythmia    afib  . Elevated TSH    a. 10/2012  - inst to f/u PCP.  . Full dentures   . Hematuria    a. while on pradaxa,  she reports that she has seen Dr Margie Ege and had low risk cystoscopy  . History of kidney stones   . Hyperlipidemia   . Hypertension    patient denies  . Incontinence    prior to urinating  . Lacunar infarction ([place])    a. 02/2009 non-acute Lacunar infarct of the right thalamus noted on MRI of brain.  . Left carotid stenosis   . Lymphedema of extremity    had this problem, since she was a teenager. especially seen in R LE  . Meniere disease   . Myocardial infarction ([place])   . Obesity   . Osteoarthritis    cervical & lumbar region, knees, hands cramp also   . PAF with post-termination pauses    a. on dronedarone;  b. CHA2DS2VASc = 5 (HTN, DM, h/o lacunar infarct on MRI, Female) ->refused oral anticoagulation after h/o hematuria on pradaxa;  c. 02/2012 Echo: EF 55-60%, mildly dil LA. D. Recurrent PAF 10/2012 after only taking Multaq 1x/day - spont conv to NSR, placed back on BID Multaq/eliquis;  e. 10/2014 Multaq d/c'd->tikosyn initiated.  . Shortness of breath dyspnea    with exertion  . Stroke [facility])    2 mini strokes  . Tobacco abuse     Past Surgical History:  Procedure Laterality Date  . ANTERIOR CERVICAL CORPECTOMY N/A 02/21/2016   Procedure: ANTERIOR CERVICAL CORPECTOMY ANS FUSION CERVICAL SIX , ANTERIOR PLATING CERVICAL FIVE-SEVEN;  Surgeon: Consuella Lose, MD;  Location: [facility];  Service: Neurosurgery;  Laterality: N/A;  . ANTERIOR FUSION CERVICAL SPINE  02/21/2016  . Biopsy of adrenal glands    . BREAST BIOPSY Bilateral    benign results, both breasts   . CARDIAC CATHETERIZATION  2014   x1 stent placed, done in [place]   . CARDIOVERSION N/A 11/11/2014   Procedure: CARDIOVERSION;  Surgeon: Larey Dresser, MD;  Location: [facility];  Service: Cardiovascular;  Laterality: N/A;  . CAROTID ENDARTERECTOMY Left 07/29/2017  . COLONOSCOPY W/ POLYPECTOMY    . CORONARY STENT PLACEMENT    . ENDARTERECTOMY  Left 07/29/2017   Procedure: ENDARTERECTOMY CAROTID LEFT;  Surgeon: Rosetta Posner, MD;  Location: [place];  Service: Vascular;  Laterality: Left;  . PATCH ANGIOPLASTY Left 07/29/2017   Procedure: PATCH ANGIOPLASTY USING HEMASHIELD GOLD VASCULAR PATCH;  Surgeon: Rosetta Posner, MD;  Location: [facility];  Service: Vascular;  Laterality: Left;  . TEE WITHOUT CARDIOVERSION N/A 11/11/2014   Procedure: TRANSESOPHAGEAL ECHOCARDIOGRAM (TEE);  Surgeon: Larey Dresser, MD;  Location: [facility];  Service: Cardiovascular;  Laterality: N/A;    Social History:  reports that she has been smoking cigarettes. She has a 55.00 pack-year smoking history. She has never used smokeless tobacco. She reports that she drinks alcohol. She reports that she does not use drugs.  Family History:  Family History  Problem Relation Age of Onset  . Heart attack Father   . Coronary artery disease Father  strong family hx  . Bladder Cancer Mother        bladder  . Leukemia Brother   . Breast cancer Maternal Aunt   . Breast cancer Paternal Aunt   . Breast cancer Maternal Aunt      Prior to Admission medications   Medication Sig Start Date End Date Taking? Authorizing Provider  albuterol (PROVENTIL HFA;VENTOLIN HFA) 108 (90 Base) MCG/ACT inhaler Inhale 2 puffs into the lungs every 6 (six) hours as needed for wheezing or shortness of breath.    Yes [provider]  aspirin EC 81 MG tablet Take 1 tablet (81 mg total) by mouth daily. 06/24/17  Yes Gherghe, Vella Redhead, MD  dofetilide (TIKOSYN) 500 MCG capsule Take 1 capsule (500 mcg total) 2 (two) times daily by mouth. 12/28/16  Yes Allred, Jeneen Rinks, MD  DULOXETINE HCL PO Take 40 mg by mouth daily.    Yes [provider]  ELIQUIS 5 MG TABS tablet TAKE 1 TABLET (5 MG TOTAL) BY MOUTH 2 (TWO) TIMES DAILY. 03/14/17  Yes Allred, Jeneen Rinks, MD  Empagliflozin-metFORMIN HCl (SYNJARDY) 12.06-998 MG TABS Take 1 tablet by mouth 2 (two) times daily.   Yes [provider]    furosemide (LASIX) 20 MG tablet Take 20 mg by mouth daily.    Yes [provider]  ibuprofen (ADVIL,MOTRIN) 800 MG tablet Take 800 mg by mouth 3 (three) times daily as needed for mild pain.    Yes [provider]  Multiple Vitamin (MULTIVITAMIN) tablet Take 1 tablet by mouth at bedtime.    Yes [provider]  oxyCODONE-acetaminophen (PERCOCET/ROXICET) 5-325 MG tablet Take 1 tablet by mouth every 6 (six) hours as needed for moderate pain. 07/30/17  Yes Dagoberto Ligas, PA-C  Phenylephrine-Acetaminophen (SINUS HEADACHE PE MAX ST) 5-325 MG TABS Take 1 tablet by mouth daily as needed (sinus headaches).   Yes [provider]  repaglinide (PRANDIN) 1 MG tablet Take 1 mg by mouth 2 (two) times daily. 11/27/17  Yes [provider]  rosuvastatin (CRESTOR) 5 MG tablet Take 5 mg by mouth at bedtime.    Yes [provider]  VALTREX 500 MG tablet Take 500 mg by mouth 2 (two) times daily as needed (for outbreaks).  02/19/13  Yes [provider]  enoxaparin (LOVENOX) 80 MG/0.8ML injection Inject 0.8 mLs (80 mg total) into the skin every 12 (twelve) hours. Patient not taking: Reported on 12/30/2017 07/25/17   Dorothy Spark, MD  nicotine (NICODERM CQ - DOSED IN MG/24 HOURS) 21 mg/24hr patch Place 1 patch (21 mg total) onto the skin daily. Patient not taking: Reported on 12/30/2017 06/25/17   Caren Griffins, MD    Physical Exam: Vitals:   12/30/17 2000 12/30/17 2015 12/30/17 2030 12/31/17 0238  BP: (!) 105/48 (!) 102/57 (!) 107/53 (!) 164/61  Pulse: 80 81 71 61  Resp: (!) 32 (!) 29 (!) 28 18  Temp:      TempSrc:      SpO2: 95% 100% 96% 97%   General: Not in acute distress HEENT:       Eyes: PERRL, EOMI, no scleral icterus.       ENT: No discharge from the ears and nose, no pharynx injection, no tonsillar enlargement.        Neck: No JVD, no bruit, no mass felt. Heme: No neck lymph node enlargement. Cardiac: S1/S2, RRR, No murmurs, No  gallops or rubs. Respiratory: No rales, wheezing, rhonchi or rubs. GI: Soft, nondistended, nontender, no rebound  pain, no organomegaly, BS present. GU: No hematuria Ext: No pitting leg edema bilaterally. 2+DP/PT pulse bilaterally. Musculoskeletal: No joint deformities, No joint redness or warmth, no limitation of ROM in spin. Skin: No rashes.  Neuro: has mild confusion, but oriented X3, cranial nerves II-XII grossly intact, moves all extremities normally. Muscle strength 5/5 in all extremities, sensation to light touch intact. Brachial reflex 2+ bilaterally. Negative Babinski's sign. Psych: Patient is not psychotic, no suicidal or hemocidal ideation.  Labs on Admission: I have personally reviewed following labs and imaging studies  CBC: Recent Labs  Lab 12/30/17 1344 12/30/17 1405  WBC 6.6  --   NEUTROABS 4.3  --   HGB 16.0* 15.6*  HCT 47.4* 46.0  MCV 91.7  --   PLT 174  --    Basic Metabolic Panel: Recent Labs  Lab 12/30/17 1344 12/30/17 1405 12/31/17 0233  NA 140 140  --   K 3.4* 3.5  --   CL 104 103  --   CO2 28  --   --   GLUCOSE 269* 274*  --   BUN 8 9  --   CREATININE 0.71 0.60  --   CALCIUM 8.9  --   --   MG  --   --  2.0   GFR: CrCl cannot be calculated (Unknown ideal weight.). Liver Function Tests: Recent Labs  Lab 12/30/17 1344  AST 19  ALT 17  ALKPHOS 82  BILITOT 0.8  PROT 5.9*  ALBUMIN 3.4*   No results for input(s): LIPASE, AMYLASE in the last 168 hours. No results for input(s): AMMONIA in the last 168 hours. Coagulation Profile: Recent Labs  Lab 12/30/17 1344  INR 1.05   Cardiac Enzymes: No results for input(s): CKTOTAL, CKMB, CKMBINDEX, TROPONINI in the last 168 hours. BNP (last 3 results) No results for input(s): PROBNP in the last 8760 hours. HbA1C: Recent Labs    12/31/17 0233  HGBA1C 9.3*   CBG: Recent Labs  Lab 12/30/17 1335 12/31/17 0131  GLUCAP 262* 201*   Lipid Profile: Recent Labs    12/31/17 0233  CHOL 114    HDL 46  LDLCALC 45  TRIG 114  CHOLHDL 2.5   Thyroid Function Tests: No results for input(s): TSH, T4TOTAL, FREET4, T3FREE, THYROIDAB in the last 72 hours. Anemia Panel: No results for input(s): VITAMINB12, FOLATE, FERRITIN, TIBC, IRON, RETICCTPCT in the last 72 hours. Urine analysis:    Component Value Date/Time   COLORURINE YELLOW 12/30/2017 1344   APPEARANCEUR CLEAR 12/30/2017 1344   LABSPEC 1.038 (H) 12/30/2017 1344   PHURINE 6.0 12/30/2017 1344   GLUCOSEU >=500 (A) 12/30/2017 1344   HGBUR NEGATIVE 12/30/2017 1344   BILIRUBINUR NEGATIVE 12/30/2017 1344   BILIRUBINUR negative 10/17/2015 1839   KETONESUR 5 (A) 12/30/2017 1344   PROTEINUR NEGATIVE 12/30/2017 1344   UROBILINOGEN 0.2 10/17/2015 1839   UROBILINOGEN 1.0 09/20/2010 0025   NITRITE NEGATIVE 12/30/2017 1344   LEUKOCYTESUR NEGATIVE 12/30/2017 1344   Sepsis Labs: @LABRCNTIP (procalcitonin:4,lacticidven:4) )No results found for this or any previous visit (from the past 240 hour(s)).   Radiological Exams on Admission: Ct Angio Head W Or Wo Contrast  Result Date: 12/31/2017 CLINICAL DATA:  Initial evaluation for acute stroke. EXAM: CT ANGIOGRAPHY HEAD AND NECK TECHNIQUE: Multidetector CT imaging of the head and neck was performed using the standard protocol during bolus administration of intravenous contrast. Multiplanar CT image reconstructions and MIPs were obtained to evaluate the vascular anatomy. Carotid stenosis measurements (when applicable) are obtained utilizing NASCET criteria,  using the distal internal carotid diameter as the denominator. CONTRAST:  23mL ISOVUE-370 IOPAMIDOL (ISOVUE-370) INJECTION 76% COMPARISON:  Previous MRI from earlier the same day as well as previous CTA from 06/23/2017. FINDINGS: CT HEAD FINDINGS Brain: Atrophy with moderate chronic small vessel ischemic disease again noted. Subtle vaulting ischemic infarct at the posterior right corona radiata grossly stable from previous MRI. No evidence for  associated hemorrhage. No acute intracranial hemorrhage. No other acute large vessel territory infarct. No mass lesion, midline shift or mass effect. No hydrocephalus. No extra-axial fluid collection. Vascular: No hyperdense vessel. Scattered vascular calcifications noted within the carotid siphons. Skull: Scalp soft tissues and calvarium within normal limits. Sinuses: Mild scattered mucosal thickening within the ethmoidal air cells. Visualized paranasal sinuses are otherwise clear. No mastoid effusion. Orbits: Globes orbital soft tissues demonstrate no acute finding. CTA NECK FINDINGS Aortic arch: Visualized aortic arch of normal caliber with normal 3 vessel morphology. Moderate atheromatous plaque within the visualize arch and about the origin of the great vessels without hemodynamically significant stenosis. Visualized subclavian arteries widely patent. Prominent coronary artery calcifications partially visualized. Right carotid system: Right common carotid artery patent from its origin to the bifurcation without stenosis. A centric calcified plaque at the proximal right ICA with associated stenosis of up to 50% by NASCET criteria, similar to previous. Right ICA tortuous but otherwise widely patent distally to the skull base without stenosis, dissection, or occlusion. Left carotid system: Left common carotid artery patent from its origin to the bifurcation without stenosis. Sequelae of prior endarterectomy noted at the left bifurcation/proximal left ICA. No significant residual or recurrent stenosis about the endarterectomy site. Left ICA widely patent distally to the skull base without stenosis, dissection, or occlusion. Vertebral arteries: Both of the vertebral arteries arise from the subclavian arteries. Vertebral arteries widely patent within the neck without stenosis, dissection, or occlusion. Skeleton: No acute osseus abnormality. Mild wedging of the superior endplate of T3 grossly stable from previous. No  discrete lytic or blastic osseous lesions. Prior ACDF at C5 through C7 with C6 corpectomy. Patient is edentulous. Other neck: No acute soft tissue abnormality within the neck. Salivary glands normal. Few scattered subcentimeter hypodense thyroid nodules noted, of doubtful significance given size and patient age. No adenopathy. Upper chest: Visualized upper chest demonstrates no acute finding. Cardiomegaly with coronary artery calcifications partially visualized. Partially visualized lungs are grossly clear. Review of the MIP images confirms the above findings CTA HEAD FINDINGS Anterior circulation: Mild scattered plaque within the petrous ICAs bilaterally without significant narrowing on the right. Similar mild stenosis on the left. Scattered atheromatous plaque throughout the cavernous/supraclinoid ICAs with resultant mild multifocal narrowing, also stable from previous. ICA termini widely patent. A1 segments well perfused. Normal anterior communicating artery. Anterior cerebral arteries widely patent to their distal aspects without stenosis. M1 segments widely patent bilaterally. Normal MCA bifurcations. No proximal M2 occlusion. Distal MCA branches well perfused and symmetric. Posterior circulation: Vertebral arteries widely patent to the vertebrobasilar junction without stenosis. Patent right PICA. Left PICA not visualized. Dominant left AICA. Basilar widely patent to its distal aspect. Superior cerebral arteries patent bilaterally. Both of the posterior cerebral arteries primarily supplied via the basilar. Mild atheromatous irregularity within the PCAs bilaterally without high-grade stenosis. PCAs are patent to their distal aspects. Small right posterior communicating artery noted. Venous sinuses: Grossly patent, although not well assessed due to arterial timing the contrast bolus. Anatomic variants: None significant. Delayed phase: No abnormal enhancement. Review of the MIP images confirms the above  findings  IMPRESSION: 1. Negative CTA for emergent large vessel occlusion. 2. Postoperative changes from interval left carotid endarterectomy without adverse features. No significant residual or recurrent stenosis identified. 3. Atheromatous stenosis of approximately 50% at the right carotid bifurcation, stable. 4. Moderate atherosclerotic change throughout the carotid siphons without high-grade stenosis, similar to previous. No other high-grade or correctable stenosis identified. Electronically Signed   By: Jeannine Boga M.D.   On: 12/31/2017 00:33   Ct Angio Neck W Or Wo Contrast  Result Date: 12/31/2017 CLINICAL DATA:  Initial evaluation for acute stroke. EXAM: CT ANGIOGRAPHY HEAD AND NECK TECHNIQUE: Multidetector CT imaging of the head and neck was performed using the standard protocol during bolus administration of intravenous contrast. Multiplanar CT image reconstructions and MIPs were obtained to evaluate the vascular anatomy. Carotid stenosis measurements (when applicable) are obtained utilizing NASCET criteria, using the distal internal carotid diameter as the denominator. CONTRAST:  55mL ISOVUE-370 IOPAMIDOL (ISOVUE-370) INJECTION 76% COMPARISON:  Previous MRI from earlier the same day as well as previous CTA from 06/23/2017. FINDINGS: CT HEAD FINDINGS Brain: Atrophy with moderate chronic small vessel ischemic disease again noted. Subtle vaulting ischemic infarct at the posterior right corona radiata grossly stable from previous MRI. No evidence for associated hemorrhage. No acute intracranial hemorrhage. No other acute large vessel territory infarct. No mass lesion, midline shift or mass effect. No hydrocephalus. No extra-axial fluid collection. Vascular: No hyperdense vessel. Scattered vascular calcifications noted within the carotid siphons. Skull: Scalp soft tissues and calvarium within normal limits. Sinuses: Mild scattered mucosal thickening within the ethmoidal air cells. Visualized paranasal  sinuses are otherwise clear. No mastoid effusion. Orbits: Globes orbital soft tissues demonstrate no acute finding. CTA NECK FINDINGS Aortic arch: Visualized aortic arch of normal caliber with normal 3 vessel morphology. Moderate atheromatous plaque within the visualize arch and about the origin of the great vessels without hemodynamically significant stenosis. Visualized subclavian arteries widely patent. Prominent coronary artery calcifications partially visualized. Right carotid system: Right common carotid artery patent from its origin to the bifurcation without stenosis. A centric calcified plaque at the proximal right ICA with associated stenosis of up to 50% by NASCET criteria, similar to previous. Right ICA tortuous but otherwise widely patent distally to the skull base without stenosis, dissection, or occlusion. Left carotid system: Left common carotid artery patent from its origin to the bifurcation without stenosis. Sequelae of prior endarterectomy noted at the left bifurcation/proximal left ICA. No significant residual or recurrent stenosis about the endarterectomy site. Left ICA widely patent distally to the skull base without stenosis, dissection, or occlusion. Vertebral arteries: Both of the vertebral arteries arise from the subclavian arteries. Vertebral arteries widely patent within the neck without stenosis, dissection, or occlusion. Skeleton: No acute osseus abnormality. Mild wedging of the superior endplate of T3 grossly stable from previous. No discrete lytic or blastic osseous lesions. Prior ACDF at C5 through C7 with C6 corpectomy. Patient is edentulous. Other neck: No acute soft tissue abnormality within the neck. Salivary glands normal. Few scattered subcentimeter hypodense thyroid nodules noted, of doubtful significance given size and patient age. No adenopathy. Upper chest: Visualized upper chest demonstrates no acute finding. Cardiomegaly with coronary artery calcifications partially  visualized. Partially visualized lungs are grossly clear. Review of the MIP images confirms the above findings CTA HEAD FINDINGS Anterior circulation: Mild scattered plaque within the petrous ICAs bilaterally without significant narrowing on the right. Similar mild stenosis on the left. Scattered atheromatous plaque throughout the cavernous/supraclinoid ICAs with resultant mild multifocal  narrowing, also stable from previous. ICA termini widely patent. A1 segments well perfused. Normal anterior communicating artery. Anterior cerebral arteries widely patent to their distal aspects without stenosis. M1 segments widely patent bilaterally. Normal MCA bifurcations. No proximal M2 occlusion. Distal MCA branches well perfused and symmetric. Posterior circulation: Vertebral arteries widely patent to the vertebrobasilar junction without stenosis. Patent right PICA. Left PICA not visualized. Dominant left AICA. Basilar widely patent to its distal aspect. Superior cerebral arteries patent bilaterally. Both of the posterior cerebral arteries primarily supplied via the basilar. Mild atheromatous irregularity within the PCAs bilaterally without high-grade stenosis. PCAs are patent to their distal aspects. Small right posterior communicating artery noted. Venous sinuses: Grossly patent, although not well assessed due to arterial timing the contrast bolus. Anatomic variants: None significant. Delayed phase: No abnormal enhancement. Review of the MIP images confirms the above findings IMPRESSION: 1. Negative CTA for emergent large vessel occlusion. 2. Postoperative changes from interval left carotid endarterectomy without adverse features. No significant residual or recurrent stenosis identified. 3. Atheromatous stenosis of approximately 50% at the right carotid bifurcation, stable. 4. Moderate atherosclerotic change throughout the carotid siphons without high-grade stenosis, similar to previous. No other high-grade or correctable  stenosis identified. Electronically Signed   By: Jeannine Boga M.D.   On: 12/31/2017 00:33   Mr Brain Wo Contrast (neuro Protocol)  Result Date: 12/30/2017 CLINICAL DATA:  Gait difficulty. Headache. Intermittent confusion for 3 days. EXAM: MRI HEAD WITHOUT CONTRAST TECHNIQUE: Multiplanar, multiecho pulse sequences of the brain and surrounding structures were obtained without intravenous contrast. COMPARISON:  Head CT 11/29/2017 and MRI 06/23/2017 FINDINGS: Brain: A 1.3 cm region of restricted diffusion involving the posterior right corona radiata is consistent with an acute lateral lenticulostriate territory infarct. Scattered small areas of mild T2 shine through are noted on diffusion imaging elsewhere in both cerebral hemispheres. One or two foci of chronic microhemorrhage are noted in the right frontal and right parietal lobes. Patchy chronic T2 hyperintensities in the cerebral white matter and pons are similar to the prior MRI and are nonspecific but compatible with moderate chronic small vessel ischemic disease, advanced for age. Chronic lacunar infarcts are again seen in the cerebral white matter bilaterally, right thalamus, and pons. There is moderate cerebral atrophy. There is no midline shift or extra-axial fluid collection. Vascular: Major intracranial vascular flow voids are preserved. Skull and upper cervical spine: Unremarkable bone marrow signal. Sinuses/Orbits: Unremarkable orbits. Clear paranasal sinuses. Trace bilateral mastoid effusions. Other: None. IMPRESSION: 1. Acute right corona radiata infarct. 2. Moderate chronic small vessel ischemic disease. Electronically Signed   By: Logan Bores M.D.   On: 12/30/2017 18:45     EKG: Independently reviewed.  Sinus rhythm, QTC 495, bifascicular block, poor R wave progression  Assessment/Plan Principal Problem:   Ischemic stroke (HCC) Active Problems:   TOBACCO ABUSE   Essential hypertension, benign   A-fib (HCC)   CAD (coronary  artery disease)   HLD (hyperlipidemia)   Diabetes mellitus without complication (HCC)   Bilateral leg edema   COPD (chronic obstructive pulmonary disease) (HCC)   Acute metabolic encephalopathy   Depression   Ischemic stroke [facility]): MRI of brain showed acute right corona radiata infarct. CTA of neck and head showed no LVO. Neurology, Dr. Leonel Ramsay was consulted.  - will place on tele bed for obs - Highly appreciated neurologist's consultation,will follow up recommendations - continue Eliquis - fasting lipid panel and HbA1c  - 2D transthoracic echocardiography  - swallowing screen. If fails, will get  SLP - Check UDS  - PT/OT consult  HTN:  -Continue home medications: lasix which is also for bilateral lower extremity lymphedema -IV hydralazine prn  TOBACCO ABUSE: -nicotine patch  Atrial Fibrillation: CHA2DS2-VASc Score is 8, needs oral anticoagulation. Patient is on Eliquis at home. Heart rate is well controlled.  -Eliquis -continue Tikosyn (keep K>4.0 and Mg>2.0 per pharm)  CAD (coronary artery disease): no CP -on crestor  HLD (hyperlipidemia): -crestor  Diabetes mellitus without complication ([place]): Last A1c 8.5 on 06/24/17, poorly controled. Patient is taking synjardy and Prandin at home -SSI  Bilateral leg edema due to lymphedema -Continue Lasix 20 mg daily  COPD (chronic obstructive pulmonary disease) ([place]): stable -prn albuterol nebs  Acute metabolic encephalopathy: mild confusion, but still oriented x 3, likely due to stroke -Frequent neuro check  Depression: Stable, no suicidal or homicidal ideations. -Continue home medications: Duloxetine    DVT ppx: on eliquis Code Status: Full code Family Communication: None at bed side.    Disposition Plan:  Anticipate discharge back to previous home environment Consults called:  Neuro, Dr. Leonel Ramsay Admission status: Obs / tele    Date of Service 12/31/2017    Ivor Costa [facility] Pager  [phone]  If 7PM-7AM, please contact night-coverage [email or web address] Password [facility] 12/31/2017, 6:27 AM

## 2017-12-30 NOTE — ED Notes (Addendum)
C/o headache behind eyes, photophobia that started today at 0930. Hx of migraines

## 2017-12-30 NOTE — ED Provider Notes (Addendum)
Care assumed from [facility] shift change, pending MRI.  See his note for full HPI and work-up.  Briefly, patient presenting with 3days difficulty walking, unsteady. Int episodes of confusion, speech difficulty. Hx of similar in past with suspected complex migraine after MRI.  Also has a history of stroke.  HA began this morning, feels similar, right side, photophobia. Outside stroke window. Pupils unequal which is new today. Plan, MRI to r/o stroke. Migraine cocktail provided improvement.  If MRI neg, d/c with neuro follow up.  Physical Exam  BP (!) 146/74   Pulse 64   Temp 98.2 F (36.8 C) (Oral)   Resp 18   SpO2 97%   Physical Exam  Constitutional: She is oriented to person, place, and time. She appears well-developed and well-nourished. No distress.  HENT:  Head: Normocephalic and atraumatic.  Eyes: Conjunctivae and EOM are normal.  Cardiovascular: Normal rate.  Pulmonary/Chest: Effort normal.  Abdominal: Soft.  Neurological: She is alert and oriented to person, place, and time.  Pupils are unequal. No cranial nerve deficits.  Equal strength bilateral upper and lower extremities.  Normal sensation.  Normal tone.  Skin: Skin is warm.  Psychiatric: She has a normal mood and affect. Her behavior is normal.  Nursing note and vitals reviewed.   Results for orders placed or performed during the hospital encounter of 12/30/17  Ethanol  Result Value Ref Range   Alcohol, Ethyl (B) <10 <10 mg/dL  Protime-INR  Result Value Ref Range   Prothrombin Time 13.6 11.4 - 15.2 seconds   INR 1.05   APTT  Result Value Ref Range   aPTT 31 24 - 36 seconds  CBC  Result Value Ref Range   WBC 6.6 4.0 - 10.5 K/uL   RBC 5.17 (H) 3.87 - 5.11 MIL/uL   Hemoglobin 16.0 (H) 12.0 - 15.0 g/dL   HCT 47.4 (H) 36.0 - 46.0 %   MCV 91.7 80.0 - 100.0 fL   MCH 30.9 26.0 - 34.0 pg   MCHC 33.8 30.0 - 36.0 g/dL   RDW 12.3 11.5 - 15.5 %   Platelets 174 150 - 400 K/uL   nRBC 0.0 0.0 - 0.2 %  Differential   Result Value Ref Range   Neutrophils Relative % 65 %   Neutro Abs 4.3 1.7 - 7.7 K/uL   Lymphocytes Relative 26 %   Lymphs Abs 1.7 0.7 - 4.0 K/uL   Monocytes Relative 6 %   Monocytes Absolute 0.4 0.1 - 1.0 K/uL   Eosinophils Relative 2 %   Eosinophils Absolute 0.1 0.0 - 0.5 K/uL   Basophils Relative 1 %   Basophils Absolute 0.1 0.0 - 0.1 K/uL   Immature Granulocytes 0 %   Abs Immature Granulocytes 0.02 0.00 - 0.07 K/uL  Comprehensive metabolic panel  Result Value Ref Range   Sodium 140 135 - 145 mmol/L   Potassium 3.4 (L) 3.5 - 5.1 mmol/L   Chloride 104 98 - 111 mmol/L   CO2 28 22 - 32 mmol/L   Glucose, Bld 269 (H) 70 - 99 mg/dL   BUN 8 8 - 23 mg/dL   Creatinine, Ser 0.71 0.44 - 1.00 mg/dL   Calcium 8.9 8.9 - 10.3 mg/dL   Total Protein 5.9 (L) 6.5 - 8.1 g/dL   Albumin 3.4 (L) 3.5 - 5.0 g/dL   AST 19 15 - 41 U/L   ALT 17 0 - 44 U/L   Alkaline Phosphatase 82 38 - 126 U/L   Total Bilirubin 0.8  0.3 - 1.2 mg/dL   GFR calc non Af Amer >60 >60 mL/min   GFR calc Af Amer >60 >60 mL/min   Anion gap 8 5 - 15  Urine rapid drug screen (hosp performed)  Result Value Ref Range   Opiates NONE DETECTED NONE DETECTED   Cocaine NONE DETECTED NONE DETECTED   Benzodiazepines NONE DETECTED NONE DETECTED   Amphetamines NONE DETECTED NONE DETECTED   Tetrahydrocannabinol NONE DETECTED NONE DETECTED   Barbiturates NONE DETECTED NONE DETECTED  Urinalysis, Routine w reflex microscopic  Result Value Ref Range   Color, Urine YELLOW YELLOW   APPearance CLEAR CLEAR   Specific Gravity, Urine 1.038 (H) 1.005 - 1.030   pH 6.0 5.0 - 8.0   Glucose, UA >=500 (A) NEGATIVE mg/dL   Hgb urine dipstick NEGATIVE NEGATIVE   Bilirubin Urine NEGATIVE NEGATIVE   Ketones, ur 5 (A) NEGATIVE mg/dL   Protein, ur NEGATIVE NEGATIVE mg/dL   Nitrite NEGATIVE NEGATIVE   Leukocytes, UA NEGATIVE NEGATIVE   RBC / HPF 0-5 0 - 5 RBC/hpf   WBC, UA 0-5 0 - 5 WBC/hpf   Bacteria, UA RARE (A) NONE SEEN   Squamous Epithelial /  LPF 0-5 0 - 5  CBG monitoring, ED  Result Value Ref Range   Glucose-Capillary 262 (H) 70 - 99 mg/dL   Comment 1 Notify RN    Comment 2 Document in Chart   I-Stat Chem 8, ED  Result Value Ref Range   Sodium 140 135 - 145 mmol/L   Potassium 3.5 3.5 - 5.1 mmol/L   Chloride 103 98 - 111 mmol/L   BUN 9 8 - 23 mg/dL   Creatinine, Ser 0.60 0.44 - 1.00 mg/dL   Glucose, Bld 274 (H) 70 - 99 mg/dL   Calcium, Ion 1.14 (L) 1.15 - 1.40 mmol/L   TCO2 29 22 - 32 mmol/L   Hemoglobin 15.6 (H) 12.0 - 15.0 g/dL   HCT 46.0 36.0 - 46.0 %  I-stat troponin, ED  Result Value Ref Range   Troponin i, poc 0.01 0.00 - 0.08 ng/mL   Comment 3           Ct Angio Head W Or Wo Contrast  Result Date: 12/31/2017 CLINICAL DATA:  Initial evaluation for acute stroke. EXAM: CT ANGIOGRAPHY HEAD AND NECK TECHNIQUE: Multidetector CT imaging of the head and neck was performed using the standard protocol during bolus administration of intravenous contrast. Multiplanar CT image reconstructions and MIPs were obtained to evaluate the vascular anatomy. Carotid stenosis measurements (when applicable) are obtained utilizing NASCET criteria, using the distal internal carotid diameter as the denominator. CONTRAST:  53mL ISOVUE-370 IOPAMIDOL (ISOVUE-370) INJECTION 76% COMPARISON:  Previous MRI from earlier the same day as well as previous CTA from 06/23/2017. FINDINGS: CT HEAD FINDINGS Brain: Atrophy with moderate chronic small vessel ischemic disease again noted. Subtle vaulting ischemic infarct at the posterior right corona radiata grossly stable from previous MRI. No evidence for associated hemorrhage. No acute intracranial hemorrhage. No other acute large vessel territory infarct. No mass lesion, midline shift or mass effect. No hydrocephalus. No extra-axial fluid collection. Vascular: No hyperdense vessel. Scattered vascular calcifications noted within the carotid siphons. Skull: Scalp soft tissues and calvarium within normal limits.  Sinuses: Mild scattered mucosal thickening within the ethmoidal air cells. Visualized paranasal sinuses are otherwise clear. No mastoid effusion. Orbits: Globes orbital soft tissues demonstrate no acute finding. CTA NECK FINDINGS Aortic arch: Visualized aortic arch of normal caliber with normal 3 vessel morphology. Moderate  atheromatous plaque within the visualize arch and about the origin of the great vessels without hemodynamically significant stenosis. Visualized subclavian arteries widely patent. Prominent coronary artery calcifications partially visualized. Right carotid system: Right common carotid artery patent from its origin to the bifurcation without stenosis. A centric calcified plaque at the proximal right ICA with associated stenosis of up to 50% by NASCET criteria, similar to previous. Right ICA tortuous but otherwise widely patent distally to the skull base without stenosis, dissection, or occlusion. Left carotid system: Left common carotid artery patent from its origin to the bifurcation without stenosis. Sequelae of prior endarterectomy noted at the left bifurcation/proximal left ICA. No significant residual or recurrent stenosis about the endarterectomy site. Left ICA widely patent distally to the skull base without stenosis, dissection, or occlusion. Vertebral arteries: Both of the vertebral arteries arise from the subclavian arteries. Vertebral arteries widely patent within the neck without stenosis, dissection, or occlusion. Skeleton: No acute osseus abnormality. Mild wedging of the superior endplate of T3 grossly stable from previous. No discrete lytic or blastic osseous lesions. Prior ACDF at C5 through C7 with C6 corpectomy. Patient is edentulous. Other neck: No acute soft tissue abnormality within the neck. Salivary glands normal. Few scattered subcentimeter hypodense thyroid nodules noted, of doubtful significance given size and patient age. No adenopathy. Upper chest: Visualized upper  chest demonstrates no acute finding. Cardiomegaly with coronary artery calcifications partially visualized. Partially visualized lungs are grossly clear. Review of the MIP images confirms the above findings CTA HEAD FINDINGS Anterior circulation: Mild scattered plaque within the petrous ICAs bilaterally without significant narrowing on the right. Similar mild stenosis on the left. Scattered atheromatous plaque throughout the cavernous/supraclinoid ICAs with resultant mild multifocal narrowing, also stable from previous. ICA termini widely patent. A1 segments well perfused. Normal anterior communicating artery. Anterior cerebral arteries widely patent to their distal aspects without stenosis. M1 segments widely patent bilaterally. Normal MCA bifurcations. No proximal M2 occlusion. Distal MCA branches well perfused and symmetric. Posterior circulation: Vertebral arteries widely patent to the vertebrobasilar junction without stenosis. Patent right PICA. Left PICA not visualized. Dominant left AICA. Basilar widely patent to its distal aspect. Superior cerebral arteries patent bilaterally. Both of the posterior cerebral arteries primarily supplied via the basilar. Mild atheromatous irregularity within the PCAs bilaterally without high-grade stenosis. PCAs are patent to their distal aspects. Small right posterior communicating artery noted. Venous sinuses: Grossly patent, although not well assessed due to arterial timing the contrast bolus. Anatomic variants: None significant. Delayed phase: No abnormal enhancement. Review of the MIP images confirms the above findings IMPRESSION: 1. Negative CTA for emergent large vessel occlusion. 2. Postoperative changes from interval left carotid endarterectomy without adverse features. No significant residual or recurrent stenosis identified. 3. Atheromatous stenosis of approximately 50% at the right carotid bifurcation, stable. 4. Moderate atherosclerotic change throughout the  carotid siphons without high-grade stenosis, similar to previous. No other high-grade or correctable stenosis identified. Electronically Signed   By: Jeannine Boga M.D.   On: 12/31/2017 00:33   Ct Angio Neck W Or Wo Contrast  Result Date: 12/31/2017 CLINICAL DATA:  Initial evaluation for acute stroke. EXAM: CT ANGIOGRAPHY HEAD AND NECK TECHNIQUE: Multidetector CT imaging of the head and neck was performed using the standard protocol during bolus administration of intravenous contrast. Multiplanar CT image reconstructions and MIPs were obtained to evaluate the vascular anatomy. Carotid stenosis measurements (when applicable) are obtained utilizing NASCET criteria, using the distal internal carotid diameter as the denominator. CONTRAST:  50mL ISOVUE-370  IOPAMIDOL (ISOVUE-370) INJECTION 76% COMPARISON:  Previous MRI from earlier the same day as well as previous CTA from 06/23/2017. FINDINGS: CT HEAD FINDINGS Brain: Atrophy with moderate chronic small vessel ischemic disease again noted. Subtle vaulting ischemic infarct at the posterior right corona radiata grossly stable from previous MRI. No evidence for associated hemorrhage. No acute intracranial hemorrhage. No other acute large vessel territory infarct. No mass lesion, midline shift or mass effect. No hydrocephalus. No extra-axial fluid collection. Vascular: No hyperdense vessel. Scattered vascular calcifications noted within the carotid siphons. Skull: Scalp soft tissues and calvarium within normal limits. Sinuses: Mild scattered mucosal thickening within the ethmoidal air cells. Visualized paranasal sinuses are otherwise clear. No mastoid effusion. Orbits: Globes orbital soft tissues demonstrate no acute finding. CTA NECK FINDINGS Aortic arch: Visualized aortic arch of normal caliber with normal 3 vessel morphology. Moderate atheromatous plaque within the visualize arch and about the origin of the great vessels without hemodynamically significant  stenosis. Visualized subclavian arteries widely patent. Prominent coronary artery calcifications partially visualized. Right carotid system: Right common carotid artery patent from its origin to the bifurcation without stenosis. A centric calcified plaque at the proximal right ICA with associated stenosis of up to 50% by NASCET criteria, similar to previous. Right ICA tortuous but otherwise widely patent distally to the skull base without stenosis, dissection, or occlusion. Left carotid system: Left common carotid artery patent from its origin to the bifurcation without stenosis. Sequelae of prior endarterectomy noted at the left bifurcation/proximal left ICA. No significant residual or recurrent stenosis about the endarterectomy site. Left ICA widely patent distally to the skull base without stenosis, dissection, or occlusion. Vertebral arteries: Both of the vertebral arteries arise from the subclavian arteries. Vertebral arteries widely patent within the neck without stenosis, dissection, or occlusion. Skeleton: No acute osseus abnormality. Mild wedging of the superior endplate of T3 grossly stable from previous. No discrete lytic or blastic osseous lesions. Prior ACDF at C5 through C7 with C6 corpectomy. Patient is edentulous. Other neck: No acute soft tissue abnormality within the neck. Salivary glands normal. Few scattered subcentimeter hypodense thyroid nodules noted, of doubtful significance given size and patient age. No adenopathy. Upper chest: Visualized upper chest demonstrates no acute finding. Cardiomegaly with coronary artery calcifications partially visualized. Partially visualized lungs are grossly clear. Review of the MIP images confirms the above findings CTA HEAD FINDINGS Anterior circulation: Mild scattered plaque within the petrous ICAs bilaterally without significant narrowing on the right. Similar mild stenosis on the left. Scattered atheromatous plaque throughout the cavernous/supraclinoid  ICAs with resultant mild multifocal narrowing, also stable from previous. ICA termini widely patent. A1 segments well perfused. Normal anterior communicating artery. Anterior cerebral arteries widely patent to their distal aspects without stenosis. M1 segments widely patent bilaterally. Normal MCA bifurcations. No proximal M2 occlusion. Distal MCA branches well perfused and symmetric. Posterior circulation: Vertebral arteries widely patent to the vertebrobasilar junction without stenosis. Patent right PICA. Left PICA not visualized. Dominant left AICA. Basilar widely patent to its distal aspect. Superior cerebral arteries patent bilaterally. Both of the posterior cerebral arteries primarily supplied via the basilar. Mild atheromatous irregularity within the PCAs bilaterally without high-grade stenosis. PCAs are patent to their distal aspects. Small right posterior communicating artery noted. Venous sinuses: Grossly patent, although not well assessed due to arterial timing the contrast bolus. Anatomic variants: None significant. Delayed phase: No abnormal enhancement. Review of the MIP images confirms the above findings IMPRESSION: 1. Negative CTA for emergent large vessel occlusion. 2. Postoperative changes  from interval left carotid endarterectomy without adverse features. No significant residual or recurrent stenosis identified. 3. Atheromatous stenosis of approximately 50% at the right carotid bifurcation, stable. 4. Moderate atherosclerotic change throughout the carotid siphons without high-grade stenosis, similar to previous. No other high-grade or correctable stenosis identified. Electronically Signed   By: Jeannine Boga M.D.   On: 12/31/2017 00:33   Mr Brain Wo Contrast (neuro Protocol)  Result Date: 12/30/2017 CLINICAL DATA:  Gait difficulty. Headache. Intermittent confusion for 3 days. EXAM: MRI HEAD WITHOUT CONTRAST TECHNIQUE: Multiplanar, multiecho pulse sequences of the brain and surrounding  structures were obtained without intravenous contrast. COMPARISON:  Head CT 11/29/2017 and MRI 06/23/2017 FINDINGS: Brain: A 1.3 cm region of restricted diffusion involving the posterior right corona radiata is consistent with an acute lateral lenticulostriate territory infarct. Scattered small areas of mild T2 shine through are noted on diffusion imaging elsewhere in both cerebral hemispheres. One or two foci of chronic microhemorrhage are noted in the right frontal and right parietal lobes. Patchy chronic T2 hyperintensities in the cerebral white matter and pons are similar to the prior MRI and are nonspecific but compatible with moderate chronic small vessel ischemic disease, advanced for age. Chronic lacunar infarcts are again seen in the cerebral white matter bilaterally, right thalamus, and pons. There is moderate cerebral atrophy. There is no midline shift or extra-axial fluid collection. Vascular: Major intracranial vascular flow voids are preserved. Skull and upper cervical spine: Unremarkable bone marrow signal. Sinuses/Orbits: Unremarkable orbits. Clear paranasal sinuses. Trace bilateral mastoid effusions. Other: None. IMPRESSION: 1. Acute right corona radiata infarct. 2. Moderate chronic small vessel ischemic disease. Electronically Signed   By: Logan Bores M.D.   On: 12/30/2017 18:45     ED Course/Procedures   Clinical Course as of Nov 12 0002  Mon Dec 30, 2017  1910 Patient reevaluated after MRI results.  Discussed plan to follow-up with neurology.  Agree with neuro exam from previous provider.  Left pupil is larger than the right.  No other deficits evident.  Did not ambulate patient.   [JR]  1920 Pt discussed with Dr. Leonel Ramsay with neuro. Recommends medicine admission. Continue eliquis.    [JR]  2329 Dr. Blaine Hamper, accepting admission.   [JR]    Clinical Course User Index [JR] Lee-Ann Gal, [place] N, PA-C    Procedures  MDM  MRI with acute right corona radiata infarct.  Discussed this  with the neuro hospitalist, Dr. Leonel Ramsay.  Patient to be admitted by hospitalist.  He recommends continue Eliquis and normal stroke work-up.  Dr. Blaine Hamper with [facility] accepting admission.      Whittaker Lenis, [place] N, PA-C 12/31/17 0005    Ladonna Vanorder, [place] N, PA-C 12/31/17 [id number]    Lajean Saver, MD 12/31/17 [phone]

## 2017-12-30 NOTE — ED Triage Notes (Signed)
Pt c/o "not being able to walk, not able to think, not myself at all" x 3 days. Has had TIA 2 weeks ago per husband. Husband says tht pt is unable to walk straight. Same symptoms 1 month ago.

## 2017-12-30 NOTE — ED Notes (Signed)
Pt ready for MRI 

## 2017-12-30 NOTE — ED Notes (Signed)
REPAGED [facility]

## 2017-12-30 NOTE — ED Provider Notes (Signed)
[facility] EMERGENCY DEPARTMENT Provider Note   CSN: [id number] Arrival date & time: 12/30/17  1322     History   Chief Complaint Chief Complaint  Patient presents with  . Weakness  . swelling in feet  . Fall    HPI Melanie Lucas is a 67 y.o. female with a history of CAD, diabetes, hypertension, hyperlipidemia, TIA, prior lacunar infarct (2011) on Eliquis who presents emergency department today for headache,  and difficulty with gait.  Patient reports of the last 3 days she has been having difficulty with her gait.  She reports that whenever she tries to walk her left knee feels like it is going to give way and she feels like she is dizzy as though she is off balance and has to use the wall to steady her.  She denies any falls, head trauma or loss consciousness.  She reports that over the last 3 days she has had intermittent episodes where she feels confused and cannot remember which words she wants to say.  She reports that at times she will have difficulty working on her phone.  She reports this morning when she woke at 9:30 AM she had a right-sided frontal headache with associated photophobia.  She states this feels similar to the headache that she had on 10/11.  Patient denies this being the worst headache of her life or thunderclap onset.  The patient denies any visual changes, diplopia, facial droop, slurred speech, neck stiffness, fever, numbness/tingling of the face or extremities, focal weakness, chest pain, shortness of breath, cough.  She notes she has chronic lower extremity edema from her lymphedema.  She has been compliant with her home medications.  HPI  Past Medical History:  Diagnosis Date  . Anxiety   . Cancer (HCC)    Skin- leg   . Chronic acquired lymphedema    a. R>L;  b. 03/2012 Neg LE U/S for DVT. Legs since age 62  . Complication of anesthesia    woke up during colonoscopy, Lithrostrixpy, Biospy  . COPD (chronic obstructive pulmonary disease)  ([place])   . Coronary artery disease   . CVD (cardiovascular disease)   . Deaf, right   . Diabetes mellitus    Type II  . Diabetic neuropathy ([place])   . Dysrhythmia    afib  . Elevated TSH    a. 10/2012 - inst to f/u PCP.  . Full dentures   . Hematuria    a. while on pradaxa,  she reports that she has seen Dr Margie Ege and had low risk cystoscopy  . History of kidney stones   . Hyperlipidemia   . Hypertension    patient denies  . Incontinence    prior to urinating  . Lacunar infarction ([place])    a. 02/2009 non-acute Lacunar infarct of the right thalamus noted on MRI of brain.  . Left carotid stenosis   . Lymphedema of extremity    had this problem, since she was a teenager. especially seen in R LE  . Meniere disease   . Myocardial infarction ([place])   . Obesity   . Osteoarthritis    cervical & lumbar region, knees, hands cramp also   . PAF with post-termination pauses    a. on dronedarone;  b. CHA2DS2VASc = 5 (HTN, DM, h/o lacunar infarct on MRI, Female) ->refused oral anticoagulation after h/o hematuria on pradaxa;  c. 02/2012 Echo: EF 55-60%, mildly dil LA. D. Recurrent PAF 10/2012 after only taking Multaq 1x/day -  spont conv to NSR, placed back on BID Multaq/eliquis;  e. 10/2014 Multaq d/c'd->tikosyn initiated.  . Shortness of breath dyspnea    with exertion  . Stroke [facility])    2 mini strokes  . Tobacco abuse     Patient Active Problem List   Diagnosis Date Noted  . Left carotid artery stenosis 07/29/2017  . TIA (transient ischemic attack) 06/24/2017  . Acute ischemic stroke ([place]) 06/23/2017  . Cervical spondylosis with myelopathy 02/21/2016  . Bilateral leg edema 11/24/2014  . Cervical stenosis of spine 11/24/2014  . Numbness of fingers of both hands 11/24/2014  . HLD (hyperlipidemia) 11/09/2014  . Diabetes mellitus ([place]) 11/09/2014  . Pulmonary hypertension ([place]) 08/07/2013  . CAD (coronary artery disease) 03/11/2013  . Peripheral edema 02/25/2012  . Essential hypertension,  benign 05/06/2009  . Overweight 04/13/2009  . TOBACCO ABUSE 04/13/2009  . A-fib ([place]) 04/13/2009  . CHEST PAIN, ATYPICAL 04/13/2009    Past Surgical History:  Procedure Laterality Date  . ANTERIOR CERVICAL CORPECTOMY N/A 02/21/2016   Procedure: ANTERIOR CERVICAL CORPECTOMY ANS FUSION CERVICAL SIX , ANTERIOR PLATING CERVICAL FIVE-SEVEN;  Surgeon: Consuella Lose, MD;  Location: [facility];  Service: Neurosurgery;  Laterality: N/A;  . ANTERIOR FUSION CERVICAL SPINE  02/21/2016  . Biopsy of adrenal glands    . BREAST BIOPSY Bilateral    benign results, both breasts   . CARDIAC CATHETERIZATION  2014   x1 stent placed, done in [place]   . CARDIOVERSION N/A 11/11/2014   Procedure: CARDIOVERSION;  Surgeon: Larey Dresser, MD;  Location: [facility];  Service: Cardiovascular;  Laterality: N/A;  . CAROTID ENDARTERECTOMY Left 07/29/2017  . COLONOSCOPY W/ POLYPECTOMY    . CORONARY STENT PLACEMENT    . ENDARTERECTOMY Left 07/29/2017   Procedure: ENDARTERECTOMY CAROTID LEFT;  Surgeon: Rosetta Posner, MD;  Location: [facility];  Service: Vascular;  Laterality: Left;  . PATCH ANGIOPLASTY Left 07/29/2017   Procedure: PATCH ANGIOPLASTY USING HEMASHIELD GOLD VASCULAR PATCH;  Surgeon: Rosetta Posner, MD;  Location: [facility];  Service: Vascular;  Laterality: Left;  . TEE WITHOUT CARDIOVERSION N/A 11/11/2014   Procedure: TRANSESOPHAGEAL ECHOCARDIOGRAM (TEE);  Surgeon: Larey Dresser, MD;  Location: [facility];  Service: Cardiovascular;  Laterality: N/A;     OB History   None      Home Medications    Prior to Admission medications   Medication Sig Start Date End Date Taking? Authorizing Provider  albuterol (PROVENTIL HFA;VENTOLIN HFA) 108 (90 Base) MCG/ACT inhaler Inhale 2 puffs into the lungs every 6 (six) hours as needed for wheezing or shortness of breath.     [provider]  aspirin EC 81 MG tablet Take 1 tablet (81 mg total) by mouth daily. 06/24/17   Caren Griffins, MD  diazepam (VALIUM) 2  MG tablet Take 2 mg by mouth daily as needed (dizziness).  09/02/12   [provider]  dofetilide (TIKOSYN) 500 MCG capsule Take 1 capsule (500 mcg total) 2 (two) times daily by mouth. 12/28/16   Allred, Jeneen Rinks, MD  DULoxetine (CYMBALTA) 60 MG capsule Take 40 mg by mouth daily.     [provider]  ELIQUIS 5 MG TABS tablet TAKE 1 TABLET (5 MG TOTAL) BY MOUTH 2 (TWO) TIMES DAILY. 03/14/17   Allred, Jeneen Rinks, MD  Empagliflozin-metFORMIN HCl (SYNJARDY) 12.06-998 MG TABS Take 1 tablet by mouth 2 (two) times daily.    [provider]  enoxaparin (LOVENOX) 80 MG/0.8ML injection Inject 0.8 mLs (80 mg total) into  the skin every 12 (twelve) hours. 07/25/17   Dorothy Spark, MD  furosemide (LASIX) 20 MG tablet Take 20 mg by mouth daily.     [provider]  ibuprofen (ADVIL,MOTRIN) 800 MG tablet Take 800 mg by mouth 3 (three) times daily as needed for mild pain.     [provider]  Multiple Vitamin (MULTIVITAMIN) tablet Take 1 tablet by mouth at bedtime.     [provider]  nicotine (NICODERM CQ - DOSED IN MG/24 HOURS) 21 mg/24hr patch Place 1 patch (21 mg total) onto the skin daily. 06/25/17   Caren Griffins, MD  oxyCODONE-acetaminophen (PERCOCET/ROXICET) 5-325 MG tablet Take 1 tablet by mouth every 6 (six) hours as needed for moderate pain. 07/30/17   Dagoberto Ligas, PA-C  Phenylephrine-Acetaminophen (SINUS HEADACHE PE MAX ST) 5-325 MG TABS Take 1 tablet by mouth daily as needed (sinus headaches).    [provider]  rosuvastatin (CRESTOR) 5 MG tablet Take 5 mg by mouth at bedtime.     [provider]  VALTREX 500 MG tablet Take 500 mg by mouth 2 (two) times daily as needed (for outbreaks).  02/19/13   [provider]  zoledronic acid (RECLAST) 5 MG/100ML SOLN injection Inject 5 mg into the vein every 6 (six) months.     [provider]    Family History Family History  Problem Relation Age of Onset  . Heart attack  Father   . Coronary artery disease Father        strong family hx  . Bladder Cancer Mother        bladder  . Leukemia Brother   . Breast cancer Maternal Aunt   . Breast cancer Paternal Aunt   . Breast cancer Maternal Aunt     Social History Social History   Tobacco Use  . Smoking status: Current Every Day Smoker    Packs/day: 1.00    Years: 55.00    Pack years: 55.00    Types: Cigarettes  . Smokeless tobacco: Never Used  . Tobacco comment: pt has patches   Substance Use Topics  . Alcohol use: Yes    Comment: 1 beer per month  . Drug use: No     Allergies   Avelox [moxifloxacin]; Chantix [varenicline]; Fosamax [alendronate]; Pradaxa [dabigatran etexilate mesylate]; Statins; Sulfamethoxazole; Sulfonamide derivatives; Ciprofloxacin; Gabapentin; and Tape   Review of Systems Review of Systems  All other systems reviewed and are negative.    Physical Exam Updated Vital Signs BP (!) 184/84 (BP Location: Right Arm)   Pulse 62   Temp 98.2 F (36.8 C) (Oral)   Resp 12   SpO2 100%   Physical Exam  Constitutional: She appears well-developed and well-nourished.  HENT:  Head: Normocephalic and atraumatic.  Right Ear: External ear normal.  Left Ear: External ear normal.  Nose: Nose normal.  Mouth/Throat: Uvula is midline, oropharynx is clear and moist and mucous membranes are normal. No tonsillar exudate.  Temporal artery pulses equal and symmetric b/l.  Eyes: Pupils are equal, round, and reactive to light. Right eye exhibits no discharge. Left eye exhibits no discharge. No scleral icterus.  Left pupil 31mm. Right pupil 61mm. Normal EOM. No vertical or rotary nystagmus.   Neck: Trachea normal. Neck supple. No spinous process tenderness present. No neck rigidity. Normal range of motion present.  No nuchal rigidity or meningismus  Cardiovascular: Normal rate, regular rhythm and intact distal pulses.  No murmur heard. Pulses:      Radial  pulses are 2+ on the right side,  and 2+ on the left side.       Dorsalis pedis pulses are 2+ on the right side, and 2+ on the left side.       Posterior tibial pulses are 2+ on the right side, and 2+ on the left side.  Lower extremity edema b/l. Symmetric  Pulmonary/Chest: Effort normal and breath sounds normal. She exhibits no tenderness.  Abdominal: Soft. Bowel sounds are normal. There is no tenderness. There is no rebound and no guarding.  Musculoskeletal: She exhibits no edema.  Lymphadenopathy:    She has no cervical adenopathy.  Neurological: She is alert.  Mental Status:  Alert, oriented, thought content appropriate, able to give a coherent history. Speech fluent without evidence of aphasia. Able to follow 2 step commands without difficulty.  Cranial Nerves:  II:  Peripheral visual fields grossly normal, left pupil 56mm, right pupil 62mm. Both are round, reactive to light III,IV, VI: ptosis not present, extra-ocular motions intact bilaterally  V,VII: smile symmetric, eyebrows raise symmetric, facial light touch sensation equal VIII: hearing grossly normal to voice  X: uvula elevates symmetrically  XI: bilateral shoulder shrug symmetric and strong XII: midline tongue extension without fassiculations Motor:  Normal tone. 5/5 in upper and lower extremities bilaterally including strong and equal grip strength and dorsiflexion/plantar flexion Sensory: Sensation intact to light touch in all extremities. Deep Tendon Reflexes: 2+ and symmetric in the biceps and patella Cerebellar: normal finger-to-nose with bilateral upper extremities. Normal heel-to -shin balance bilaterally of the lower extremity. No pronator drift.  CV: distal pulses palpable throughout   Skin: Skin is warm and dry. No rash noted. She is not diaphoretic.  Psychiatric: She has a normal mood and affect.  Nursing note and vitals reviewed.    ED Treatments / Results  Labs (all labs ordered are listed, but only abnormal results are displayed) Labs  Reviewed  CBC - Abnormal; Notable for the following components:      Result Value   RBC 5.17 (*)    Hemoglobin 16.0 (*)    HCT 47.4 (*)    All other components within normal limits  COMPREHENSIVE METABOLIC PANEL - Abnormal; Notable for the following components:   Potassium 3.4 (*)    Glucose, Bld 269 (*)    Total Protein 5.9 (*)    Albumin 3.4 (*)    All other components within normal limits  URINALYSIS, ROUTINE W REFLEX MICROSCOPIC - Abnormal; Notable for the following components:   Specific Gravity, Urine 1.038 (*)    Glucose, UA >=500 (*)    Ketones, ur 5 (*)    Bacteria, UA RARE (*)    All other components within normal limits  CBG MONITORING, ED - Abnormal; Notable for the following components:   Glucose-Capillary 262 (*)    All other components within normal limits  I-STAT CHEM 8, ED - Abnormal; Notable for the following components:   Glucose, Bld 274 (*)    Calcium, Ion 1.14 (*)    Hemoglobin 15.6 (*)    All other components within normal limits  ETHANOL  PROTIME-INR  APTT  DIFFERENTIAL  RAPID URINE DRUG SCREEN, HOSP PERFORMED  I-STAT TROPONIN, ED    EKG EKG Interpretation  Date/Time:  Monday December 30 2017 13:31:37 EST Ventricular Rate:  67 PR Interval:    QRS Duration: 127 QT Interval:  468 QTC Calculation: 495 R Axis:   -85 Text Interpretation:  Sinus arrhythmia RBBB and LAFB Probable left ventricular  hypertrophy Nonspecific T wave abnormality Baseline wander in lead(s) III aVL aVF V6 Confirmed by Lajean Saver [phone]) on 12/30/2017 1:36:55 PM Also confirmed by Lajean Saver [phone]), editor Lynder Parents [phone])  on 12/30/2017 2:12:37 PM   Radiology No results found.  Procedures Procedures (including critical care time)  Medications Ordered in ED Medications  sodium chloride 0.9 % bolus 500 mL (500 mLs Intravenous New Bag/Given 12/30/17 1459)  prochlorperazine (COMPAZINE) injection 10 mg (10 mg Intravenous Given 12/30/17 1459)  diphenhydrAMINE  (BENADRYL) injection 25 mg (25 mg Intravenous Given 12/30/17 1459)     Initial Impression / Assessment and Plan / ED Course  I have reviewed the triage vital signs and the nursing notes.  Pertinent labs & imaging results that were available during my care of the patient were reviewed by me and considered in my medical decision making (see chart for details).     67 y.o. female with a history of CAD, diabetes, hypertension, hyperlipidemia, TIA, prior lacunar infarct (2011) on Eliquis who presents emergency department today for headache, and difficulty with gait.  Patient reports of the last 3 days she has been having difficulty with her gait, saying that is hard to keep her balance and she cannot walk in a straight line.  She reports she has had intermittent episodes of confusion cannot remember which words she needs to say.  Patient does have a history of this with similar episode several months ago and most recently on 10/11.  Patient reports that this morning at 9:30 AM she also developed a right-sided frontal headache with associated photophobia.  She states this is similar to the headache that she has had in past and was treated for a migraine with resolution of her symptoms.  She states the unsteadiness with gait is new.  She denies any fever, neck stiffness, focal weakness, visual changes, facial droop, thunderclap onset of headache or worst headache of her life.  Exam is reassuring as above.  No focal deficits on exam besides new pupil asymmetry.  Headache is not consistent with GCA or subarachnoid.  No meningeal signs.  Will order TIA order set.  No indication to call code stroke.  Will treat with migraine cocktail.  After discussion with my attending, Dr. Ashok Cordia will order MRI as well to r/o cerebellar stroke. Patient updated on plan and in agreement.   Blood work is reassuring and at patients baseline. VSS. Will CT and MRI negative, case signed out to [place]. If MRI negative, plan for  discharge home with neuro follow up. If MRI positive, will need neuro consult.   Final Clinical Impressions(s) / ED Diagnoses   Final diagnoses:  None    ED Discharge Orders    None       Lorelle Gibbs 12/30/17 1601    Lajean Saver, MD 01/03/18 2012

## 2017-12-30 NOTE — Consult Note (Signed)
Neurology Consultation Reason for Consult: Stroke Referring Physician: Tegeler, C  CC: Unsteady gait  History is obtained from: Patient  HPI: Melanie Lucas is a 67 y.o. female with a history of diabetes, tobacco use, hyperlipidemia who presents with unsteady gait for 3 days.  She states that she is also noticed that her left leg is been giving way.   LKW: 3 days ago tpa given?: no, outside of window Premorbid modified rankin scale: 0    ROS: A 14 point ROS was performed and is negative except as noted in the HPI.    Past Medical History:  Diagnosis Date  . Anxiety   . Cancer (HCC)    Skin- leg   . Chronic acquired lymphedema    a. R>L;  b. 03/2012 Neg LE U/S for DVT. Legs since age 60  . Complication of anesthesia    woke up during colonoscopy, Lithrostrixpy, Biospy  . COPD (chronic obstructive pulmonary disease) ([place])   . Coronary artery disease   . CVD (cardiovascular disease)   . Deaf, right   . Diabetes mellitus    Type II  . Diabetic neuropathy ([place])   . Dysrhythmia    afib  . Elevated TSH    a. 10/2012 - inst to f/u PCP.  . Full dentures   . Hematuria    a. while on pradaxa,  she reports that she has seen Dr Margie Ege and had low risk cystoscopy  . History of kidney stones   . Hyperlipidemia   . Hypertension    patient denies  . Incontinence    prior to urinating  . Lacunar infarction ([place])    a. 02/2009 non-acute Lacunar infarct of the right thalamus noted on MRI of brain.  . Left carotid stenosis   . Lymphedema of extremity    had this problem, since she was a teenager. especially seen in R LE  . Meniere disease   . Myocardial infarction ([place])   . Obesity   . Osteoarthritis    cervical & lumbar region, knees, hands cramp also   . PAF with post-termination pauses    a. on dronedarone;  b. CHA2DS2VASc = 5 (HTN, DM, h/o lacunar infarct on MRI, Female) ->refused oral anticoagulation after h/o hematuria on pradaxa;  c. 02/2012 Echo: EF 55-60%, mildly dil LA. D.  Recurrent PAF 10/2012 after only taking Multaq 1x/day - spont conv to NSR, placed back on BID Multaq/eliquis;  e. 10/2014 Multaq d/c'd->tikosyn initiated.  . Shortness of breath dyspnea    with exertion  . Stroke [facility])    2 mini strokes  . Tobacco abuse      Family History  Problem Relation Age of Onset  . Heart attack Father   . Coronary artery disease Father        strong family hx  . Bladder Cancer Mother        bladder  . Leukemia Brother   . Breast cancer Maternal Aunt   . Breast cancer Paternal Aunt   . Breast cancer Maternal Aunt      Social History:  reports that she has been smoking cigarettes. She has a 55.00 pack-year smoking history. She has never used smokeless tobacco. She reports that she drinks alcohol. She reports that she does not use drugs.   Exam: Current vital signs: BP (!) 108/45   Pulse 100   Temp 98.2 F (36.8 C) (Oral)   Resp (!) 21   SpO2 98%  Vital signs in last 24 hours:  Temp:  [98.2 F (36.8 C)] 98.2 F (36.8 C) (11/11 1332) Pulse Rate:  [58-100] 100 (11/11 1915) Resp:  [12-21] 21 (11/11 1915) BP: (108-184)/(45-84) 108/45 (11/11 1915) SpO2:  [96 %-100 %] 98 % (11/11 1915)   Physical Exam  Constitutional: Appears well-developed and well-nourished.  Psych: Affect appropriate to situation Eyes: No scleral injection HENT: No OP obstrucion Head: Normocephalic.  Cardiovascular: Normal rate and regular rhythm.  Respiratory: Effort normal, non-labored breathing GI: Soft.  No distension. There is no tenderness.  Skin: WDI  Neuro: Mental Status: Patient is awake, alert, oriented to person, place, month, year, and situation. Patient is able to give a clear and coherent history. No signs of aphasia or neglect Cranial Nerves: II: Visual Fields are full.  Left pupil is larger than right, both are reactive III,IV, VI: EOMI without ptosis or diploplia.  V: Facial sensation is symmetric to temperature VII: Facial movement is symmetric.  VIII:  hearing is intact to voice X: Uvula elevates symmetrically XI: Shoulder shrug is symmetric. XII: tongue is midline without atrophy or fasciculations.  Motor: Tone is normal. Bulk is normal.  She has 4+/5 strength in the left upper extremity, 4/5 strength in the left lower extremity Sensory: Sensation is symmetric to light touch and temperature in the arms, but decreased to light touch in the left leg Cerebellar: No clear ataxia    I have reviewed labs in epic and the results pertinent to this consultation are: CMP-elevated glucose UDS-negative  I have reviewed the images obtained: MRI brain-subcortical infarct on the right  Impression: 67 year old female with a history of atrial fibrillation on Eliquis who presents with small ischemic infarct.  I suspect that this may actually be due to small vessel disease from her diabetes and tobacco abuse as opposed to embolic.  In any case, she will need therapy and admission for secondary risk factor modification.  She is already multiple days out and has been on anticoagulation this entire time, I think that the risk of continuing it would be relatively small.  Recommendations: - HgbA1c, fasting lipid panel - Frequent neuro checks - Echocardiogram -CTA head neck - Prophylactic therapy-Antiplatelet med: Anticoagulation. - Risk factor modification - Telemetry monitoring - PT consult, OT consult, Speech consult - Stroke team to follow    Roland Rack, MD [facility] [phone]  If 7pm- 7am, please page neurology on call as listed in [place].

## 2017-12-31 ENCOUNTER — Ambulatory Visit (HOSPITAL_COMMUNITY): Payer: Medicare Other

## 2017-12-31 DIAGNOSIS — E1140 Type 2 diabetes mellitus with diabetic neuropathy, unspecified: Secondary | ICD-10-CM | POA: Diagnosis present

## 2017-12-31 DIAGNOSIS — Z888 Allergy status to other drugs, medicaments and biological substances status: Secondary | ICD-10-CM | POA: Diagnosis not present

## 2017-12-31 DIAGNOSIS — G9341 Metabolic encephalopathy: Secondary | ICD-10-CM | POA: Diagnosis present

## 2017-12-31 DIAGNOSIS — I480 Paroxysmal atrial fibrillation: Secondary | ICD-10-CM | POA: Diagnosis present

## 2017-12-31 DIAGNOSIS — Z882 Allergy status to sulfonamides status: Secondary | ICD-10-CM | POA: Diagnosis not present

## 2017-12-31 DIAGNOSIS — Z85828 Personal history of other malignant neoplasm of skin: Secondary | ICD-10-CM | POA: Diagnosis not present

## 2017-12-31 DIAGNOSIS — J449 Chronic obstructive pulmonary disease, unspecified: Secondary | ICD-10-CM | POA: Diagnosis present

## 2017-12-31 DIAGNOSIS — I639 Cerebral infarction, unspecified: Secondary | ICD-10-CM | POA: Diagnosis present

## 2017-12-31 DIAGNOSIS — I482 Chronic atrial fibrillation, unspecified: Secondary | ICD-10-CM | POA: Diagnosis present

## 2017-12-31 DIAGNOSIS — E785 Hyperlipidemia, unspecified: Secondary | ICD-10-CM | POA: Diagnosis present

## 2017-12-31 DIAGNOSIS — Z91048 Other nonmedicinal substance allergy status: Secondary | ICD-10-CM | POA: Diagnosis not present

## 2017-12-31 DIAGNOSIS — R262 Difficulty in walking, not elsewhere classified: Secondary | ICD-10-CM | POA: Diagnosis present

## 2017-12-31 DIAGNOSIS — I6389 Other cerebral infarction: Secondary | ICD-10-CM | POA: Diagnosis present

## 2017-12-31 DIAGNOSIS — I5030 Unspecified diastolic (congestive) heart failure: Secondary | ICD-10-CM | POA: Diagnosis present

## 2017-12-31 DIAGNOSIS — I252 Old myocardial infarction: Secondary | ICD-10-CM | POA: Diagnosis not present

## 2017-12-31 DIAGNOSIS — F32A Depression, unspecified: Secondary | ICD-10-CM | POA: Diagnosis present

## 2017-12-31 DIAGNOSIS — H9191 Unspecified hearing loss, right ear: Secondary | ICD-10-CM | POA: Diagnosis present

## 2017-12-31 DIAGNOSIS — I340 Nonrheumatic mitral (valve) insufficiency: Secondary | ICD-10-CM

## 2017-12-31 DIAGNOSIS — F419 Anxiety disorder, unspecified: Secondary | ICD-10-CM | POA: Diagnosis present

## 2017-12-31 DIAGNOSIS — R471 Dysarthria and anarthria: Secondary | ICD-10-CM | POA: Diagnosis present

## 2017-12-31 DIAGNOSIS — R29700 NIHSS score 0: Secondary | ICD-10-CM | POA: Diagnosis present

## 2017-12-31 DIAGNOSIS — Z8673 Personal history of transient ischemic attack (TIA), and cerebral infarction without residual deficits: Secondary | ICD-10-CM | POA: Diagnosis not present

## 2017-12-31 DIAGNOSIS — I110 Hypertensive heart disease with heart failure: Secondary | ICD-10-CM | POA: Diagnosis present

## 2017-12-31 DIAGNOSIS — R29701 NIHSS score 1: Secondary | ICD-10-CM | POA: Diagnosis not present

## 2017-12-31 DIAGNOSIS — Z981 Arthrodesis status: Secondary | ICD-10-CM | POA: Diagnosis not present

## 2017-12-31 DIAGNOSIS — F329 Major depressive disorder, single episode, unspecified: Secondary | ICD-10-CM | POA: Diagnosis present

## 2017-12-31 DIAGNOSIS — Z87442 Personal history of urinary calculi: Secondary | ICD-10-CM | POA: Diagnosis not present

## 2017-12-31 DIAGNOSIS — I2510 Atherosclerotic heart disease of native coronary artery without angina pectoris: Secondary | ICD-10-CM | POA: Diagnosis present

## 2017-12-31 DIAGNOSIS — R600 Localized edema: Secondary | ICD-10-CM | POA: Diagnosis not present

## 2017-12-31 LAB — CBG MONITORING, ED
GLUCOSE-CAPILLARY: 256 mg/dL — AB (ref 70–99)
Glucose-Capillary: 201 mg/dL — ABNORMAL HIGH (ref 70–99)
Glucose-Capillary: 154 mg/dL — ABNORMAL HIGH (ref 70–99)

## 2017-12-31 LAB — GLUCOSE, CAPILLARY
Glucose-Capillary: 179 mg/dL — ABNORMAL HIGH (ref 70–99)
Glucose-Capillary: 276 mg/dL — ABNORMAL HIGH (ref 70–99)

## 2017-12-31 LAB — LIPID PANEL
CHOLESTEROL: 114 mg/dL (ref 0–200)
HDL: 46 mg/dL (ref >40)
LDL CALC: 45 mg/dL (ref 0–99)
TRIGLYCERIDES: 114 mg/dL (ref <150)
Total CHOL/HDL Ratio: 2.5 RATIO
VLDL: 23 mg/dL (ref 0–40)

## 2017-12-31 LAB — HIV ANTIBODY (ROUTINE TESTING W REFLEX): HIV Screen 4th Generation wRfx: NONREACTIVE

## 2017-12-31 LAB — ECHOCARDIOGRAM COMPLETE

## 2017-12-31 LAB — HEMOGLOBIN A1C
HEMOGLOBIN A1C: 9.3 % — AB (ref 4.8–5.6)
Mean Plasma Glucose: 220.21 mg/dL

## 2017-12-31 LAB — MAGNESIUM: MAGNESIUM: 2 mg/dL (ref 1.7–2.4)

## 2017-12-31 MED ORDER — DOFETILIDE 500 MCG PO CAPS
500.0000 ug | ORAL_CAPSULE | Freq: Two times a day (BID) | ORAL | Status: DC
  Administered 2017-12-31 – 2018-01-01 (×3): 500 ug via ORAL
  Filled 2017-12-31 (×4): qty 1

## 2017-12-31 MED ORDER — PSEUDOEPHEDRINE HCL 30 MG PO TABS
30.0000 mg | ORAL_TABLET | Freq: Every day | ORAL | Status: DC | PRN
  Filled 2017-12-31: qty 1

## 2017-12-31 MED ORDER — STROKE: EARLY STAGES OF RECOVERY BOOK
Freq: Once | Status: AC
  Administered 2017-12-31: 09:00:00
  Filled 2017-12-31: qty 1

## 2017-12-31 MED ORDER — ALBUTEROL SULFATE (2.5 MG/3ML) 0.083% IN NEBU: 2.5000 mg | INHALATION_SOLUTION | RESPIRATORY_TRACT | Status: DC | PRN

## 2017-12-31 MED ORDER — FUROSEMIDE 20 MG PO TABS
20.0000 mg | ORAL_TABLET | Freq: Every day | ORAL | Status: DC
  Administered 2017-12-31 – 2018-01-01 (×2): 20 mg via ORAL
  Filled 2017-12-31 (×2): qty 1

## 2017-12-31 MED ORDER — INSULIN ASPART 100 UNIT/ML ~~LOC~~ SOLN
0.0000 [IU] | Freq: Three times a day (TID) | SUBCUTANEOUS | Status: DC
  Administered 2017-12-31 – 2018-01-01 (×2): 2 [IU] via SUBCUTANEOUS
  Filled 2017-12-31: qty 1

## 2017-12-31 MED ORDER — ACETAMINOPHEN 500 MG PO TABS: 500.0000 mg | ORAL_TABLET | Freq: Every day | ORAL | Status: DC | PRN

## 2017-12-31 MED ORDER — OXYCODONE-ACETAMINOPHEN 5-325 MG PO TABS: 1.0000 | ORAL_TABLET | Freq: Four times a day (QID) | ORAL | Status: DC | PRN

## 2017-12-31 MED ORDER — ACETAMINOPHEN 325 MG PO TABS: 650.0000 mg | ORAL_TABLET | ORAL | Status: DC | PRN

## 2017-12-31 MED ORDER — ADULT MULTIVITAMIN W/MINERALS CH
1.0000 | ORAL_TABLET | Freq: Every day | ORAL | Status: DC
  Administered 2017-12-31: 1 via ORAL
  Filled 2017-12-31: qty 1

## 2017-12-31 MED ORDER — POTASSIUM CHLORIDE 20 MEQ/15ML (10%) PO SOLN
40.0000 meq | Freq: Once | ORAL | Status: AC
  Administered 2017-12-31: 40 meq via ORAL
  Filled 2017-12-31: qty 30

## 2017-12-31 MED ORDER — PHENYLEPHRINE-ACETAMINOPHEN 5-325 MG PO TABS: 1.0000 | ORAL_TABLET | Freq: Every day | ORAL | Status: DC | PRN

## 2017-12-31 MED ORDER — ACETAMINOPHEN 650 MG RE SUPP: 650.0000 mg | RECTAL | Status: DC | PRN

## 2017-12-31 MED ORDER — ROSUVASTATIN CALCIUM 5 MG PO TABS
5.0000 mg | ORAL_TABLET | Freq: Every day | ORAL | Status: DC
  Administered 2017-12-31 (×2): 5 mg via ORAL
  Filled 2017-12-31 (×3): qty 1

## 2017-12-31 MED ORDER — ONDANSETRON HCL 4 MG/2ML IJ SOLN: 4.0000 mg | Freq: Three times a day (TID) | INTRAMUSCULAR | Status: DC | PRN

## 2017-12-31 MED ORDER — HYDRALAZINE HCL 20 MG/ML IJ SOLN: 5.0000 mg | INTRAMUSCULAR | Status: DC | PRN

## 2017-12-31 MED ORDER — INSULIN ASPART 100 UNIT/ML ~~LOC~~ SOLN
0.0000 [IU] | Freq: Every day | SUBCUTANEOUS | Status: DC
  Administered 2017-12-31: 2 [IU] via SUBCUTANEOUS
  Administered 2018-01-01: 3 [IU] via SUBCUTANEOUS
  Filled 2017-12-31: qty 1

## 2017-12-31 MED ORDER — ZOLPIDEM TARTRATE 5 MG PO TABS: 5.0000 mg | ORAL_TABLET | Freq: Every evening | ORAL | Status: DC | PRN

## 2017-12-31 MED ORDER — APIXABAN 5 MG PO TABS
5.0000 mg | ORAL_TABLET | Freq: Two times a day (BID) | ORAL | Status: DC
  Administered 2017-12-31 – 2018-01-01 (×4): 5 mg via ORAL
  Filled 2017-12-31 (×5): qty 1

## 2017-12-31 MED ORDER — NICOTINE 21 MG/24HR TD PT24
21.0000 mg | MEDICATED_PATCH | Freq: Every day | TRANSDERMAL | Status: DC
  Administered 2017-12-31 – 2018-01-01 (×3): 21 mg via TRANSDERMAL
  Filled 2017-12-31 (×3): qty 1

## 2017-12-31 MED ORDER — SENNOSIDES-DOCUSATE SODIUM 8.6-50 MG PO TABS: 1.0000 | ORAL_TABLET | Freq: Every evening | ORAL | Status: DC | PRN

## 2017-12-31 MED ORDER — ASPIRIN EC 81 MG PO TBEC
81.0000 mg | DELAYED_RELEASE_TABLET | Freq: Every day | ORAL | Status: DC
  Administered 2018-01-01: 81 mg via ORAL
  Filled 2017-12-31 (×3): qty 1

## 2017-12-31 MED ORDER — DULOXETINE HCL 20 MG PO CPEP
40.0000 mg | ORAL_CAPSULE | Freq: Every day | ORAL | Status: DC
  Administered 2017-12-31 – 2018-01-01 (×2): 40 mg via ORAL
  Filled 2017-12-31 (×2): qty 2

## 2017-12-31 MED ORDER — ACETAMINOPHEN 160 MG/5ML PO SOLN: 650.0000 mg | ORAL | Status: DC | PRN

## 2017-12-31 NOTE — Progress Notes (Signed)
  Echocardiogram 2D Echocardiogram has been performed.  Madelaine Etienne 12/31/2017, 10:36 AM

## 2017-12-31 NOTE — Progress Notes (Addendum)
STROKE TEAM PROGRESS NOTE   INTERVAL HISTORY No family is at the bedside.  She is upset because she has not had anyuthing to eat x 3 days. Patient concerned about tremors - denies taking albuterol. Denies ETOH use.   Vitals:   12/30/17 2015 12/30/17 2030 12/31/17 0238 12/31/17 1028  BP: (!) 102/57 (!) 107/53 (!) 164/61 (!) 159/79  Pulse: 81 71 61 63  Resp: (!) 29 (!) 28 18 18   Temp:      TempSrc:      SpO2: 100% 96% 97% 100%    CBC:  Recent Labs  Lab 12/30/17 1344 12/30/17 1405  WBC 6.6  --   NEUTROABS 4.3  --   HGB 16.0* 15.6*  HCT 47.4* 46.0  MCV 91.7  --   PLT 174  --     Basic Metabolic Panel:  Recent Labs  Lab 12/30/17 1344 12/30/17 1405 12/31/17 0233  NA 140 140  --   K 3.4* 3.5  --   CL 104 103  --   CO2 28  --   --   GLUCOSE 269* 274*  --   BUN 8 9  --   CREATININE 0.71 0.60  --   CALCIUM 8.9  --   --   MG  --   --  2.0   Lipid Panel:     Component Value Date/Time   CHOL 114 12/31/2017 0233   TRIG 114 12/31/2017 0233   HDL 46 12/31/2017 0233   CHOLHDL 2.5 12/31/2017 0233   VLDL 23 12/31/2017 0233   LDLCALC 45 12/31/2017 0233   HgbA1c:  Lab Results  Component Value Date   HGBA1C 9.3 (H) 12/31/2017   Urine Drug Screen:     Component Value Date/Time   LABOPIA NONE DETECTED 12/30/2017 1344   COCAINSCRNUR NONE DETECTED 12/30/2017 1344   LABBENZ NONE DETECTED 12/30/2017 1344   AMPHETMU NONE DETECTED 12/30/2017 1344   THCU NONE DETECTED 12/30/2017 1344   LABBARB NONE DETECTED 12/30/2017 1344    Alcohol Level     Component Value Date/Time   ETH <10 12/30/2017 1344    IMAGING Ct Angio Head W Or Wo Contrast  Result Date: 12/31/2017 CLINICAL DATA:  Initial evaluation for acute stroke. EXAM: CT ANGIOGRAPHY HEAD AND NECK TECHNIQUE: Multidetector CT imaging of the head and neck was performed using the standard protocol during bolus administration of intravenous contrast. Multiplanar CT image reconstructions and MIPs were obtained to evaluate  the vascular anatomy. Carotid stenosis measurements (when applicable) are obtained utilizing NASCET criteria, using the distal internal carotid diameter as the denominator. CONTRAST:  32mL ISOVUE-370 IOPAMIDOL (ISOVUE-370) INJECTION 76% COMPARISON:  Previous MRI from earlier the same day as well as previous CTA from 06/23/2017. FINDINGS: CT HEAD FINDINGS Brain: Atrophy with moderate chronic small vessel ischemic disease again noted. Subtle vaulting ischemic infarct at the posterior right corona radiata grossly stable from previous MRI. No evidence for associated hemorrhage. No acute intracranial hemorrhage. No other acute large vessel territory infarct. No mass lesion, midline shift or mass effect. No hydrocephalus. No extra-axial fluid collection. Vascular: No hyperdense vessel. Scattered vascular calcifications noted within the carotid siphons. Skull: Scalp soft tissues and calvarium within normal limits. Sinuses: Mild scattered mucosal thickening within the ethmoidal air cells. Visualized paranasal sinuses are otherwise clear. No mastoid effusion. Orbits: Globes orbital soft tissues demonstrate no acute finding. CTA NECK FINDINGS Aortic arch: Visualized aortic arch of normal caliber with normal 3 vessel morphology. Moderate atheromatous plaque within the visualize arch  and about the origin of the great vessels without hemodynamically significant stenosis. Visualized subclavian arteries widely patent. Prominent coronary artery calcifications partially visualized. Right carotid system: Right common carotid artery patent from its origin to the bifurcation without stenosis. A centric calcified plaque at the proximal right ICA with associated stenosis of up to 50% by NASCET criteria, similar to previous. Right ICA tortuous but otherwise widely patent distally to the skull base without stenosis, dissection, or occlusion. Left carotid system: Left common carotid artery patent from its origin to the bifurcation without  stenosis. Sequelae of prior endarterectomy noted at the left bifurcation/proximal left ICA. No significant residual or recurrent stenosis about the endarterectomy site. Left ICA widely patent distally to the skull base without stenosis, dissection, or occlusion. Vertebral arteries: Both of the vertebral arteries arise from the subclavian arteries. Vertebral arteries widely patent within the neck without stenosis, dissection, or occlusion. Skeleton: No acute osseus abnormality. Mild wedging of the superior endplate of T3 grossly stable from previous. No discrete lytic or blastic osseous lesions. Prior ACDF at C5 through C7 with C6 corpectomy. Patient is edentulous. Other neck: No acute soft tissue abnormality within the neck. Salivary glands normal. Few scattered subcentimeter hypodense thyroid nodules noted, of doubtful significance given size and patient age. No adenopathy. Upper chest: Visualized upper chest demonstrates no acute finding. Cardiomegaly with coronary artery calcifications partially visualized. Partially visualized lungs are grossly clear. Review of the MIP images confirms the above findings CTA HEAD FINDINGS Anterior circulation: Mild scattered plaque within the petrous ICAs bilaterally without significant narrowing on the right. Similar mild stenosis on the left. Scattered atheromatous plaque throughout the cavernous/supraclinoid ICAs with resultant mild multifocal narrowing, also stable from previous. ICA termini widely patent. A1 segments well perfused. Normal anterior communicating artery. Anterior cerebral arteries widely patent to their distal aspects without stenosis. M1 segments widely patent bilaterally. Normal MCA bifurcations. No proximal M2 occlusion. Distal MCA branches well perfused and symmetric. Posterior circulation: Vertebral arteries widely patent to the vertebrobasilar junction without stenosis. Patent right PICA. Left PICA not visualized. Dominant left AICA. Basilar widely  patent to its distal aspect. Superior cerebral arteries patent bilaterally. Both of the posterior cerebral arteries primarily supplied via the basilar. Mild atheromatous irregularity within the PCAs bilaterally without high-grade stenosis. PCAs are patent to their distal aspects. Small right posterior communicating artery noted. Venous sinuses: Grossly patent, although not well assessed due to arterial timing the contrast bolus. Anatomic variants: None significant. Delayed phase: No abnormal enhancement. Review of the MIP images confirms the above findings IMPRESSION: 1. Negative CTA for emergent large vessel occlusion. 2. Postoperative changes from interval left carotid endarterectomy without adverse features. No significant residual or recurrent stenosis identified. 3. Atheromatous stenosis of approximately 50% at the right carotid bifurcation, stable. 4. Moderate atherosclerotic change throughout the carotid siphons without high-grade stenosis, similar to previous. No other high-grade or correctable stenosis identified. Electronically Signed   By: Jeannine Boga M.D.   On: 12/31/2017 00:33   Ct Angio Neck W Or Wo Contrast  Result Date: 12/31/2017 CLINICAL DATA:  Initial evaluation for acute stroke. EXAM: CT ANGIOGRAPHY HEAD AND NECK TECHNIQUE: Multidetector CT imaging of the head and neck was performed using the standard protocol during bolus administration of intravenous contrast. Multiplanar CT image reconstructions and MIPs were obtained to evaluate the vascular anatomy. Carotid stenosis measurements (when applicable) are obtained utilizing NASCET criteria, using the distal internal carotid diameter as the denominator. CONTRAST:  93mL ISOVUE-370 IOPAMIDOL (ISOVUE-370) INJECTION 76% COMPARISON:  Previous MRI from earlier the same day as well as previous CTA from 06/23/2017. FINDINGS: CT HEAD FINDINGS Brain: Atrophy with moderate chronic small vessel ischemic disease again noted. Subtle vaulting  ischemic infarct at the posterior right corona radiata grossly stable from previous MRI. No evidence for associated hemorrhage. No acute intracranial hemorrhage. No other acute large vessel territory infarct. No mass lesion, midline shift or mass effect. No hydrocephalus. No extra-axial fluid collection. Vascular: No hyperdense vessel. Scattered vascular calcifications noted within the carotid siphons. Skull: Scalp soft tissues and calvarium within normal limits. Sinuses: Mild scattered mucosal thickening within the ethmoidal air cells. Visualized paranasal sinuses are otherwise clear. No mastoid effusion. Orbits: Globes orbital soft tissues demonstrate no acute finding. CTA NECK FINDINGS Aortic arch: Visualized aortic arch of normal caliber with normal 3 vessel morphology. Moderate atheromatous plaque within the visualize arch and about the origin of the great vessels without hemodynamically significant stenosis. Visualized subclavian arteries widely patent. Prominent coronary artery calcifications partially visualized. Right carotid system: Right common carotid artery patent from its origin to the bifurcation without stenosis. A centric calcified plaque at the proximal right ICA with associated stenosis of up to 50% by NASCET criteria, similar to previous. Right ICA tortuous but otherwise widely patent distally to the skull base without stenosis, dissection, or occlusion. Left carotid system: Left common carotid artery patent from its origin to the bifurcation without stenosis. Sequelae of prior endarterectomy noted at the left bifurcation/proximal left ICA. No significant residual or recurrent stenosis about the endarterectomy site. Left ICA widely patent distally to the skull base without stenosis, dissection, or occlusion. Vertebral arteries: Both of the vertebral arteries arise from the subclavian arteries. Vertebral arteries widely patent within the neck without stenosis, dissection, or occlusion. Skeleton:  No acute osseus abnormality. Mild wedging of the superior endplate of T3 grossly stable from previous. No discrete lytic or blastic osseous lesions. Prior ACDF at C5 through C7 with C6 corpectomy. Patient is edentulous. Other neck: No acute soft tissue abnormality within the neck. Salivary glands normal. Few scattered subcentimeter hypodense thyroid nodules noted, of doubtful significance given size and patient age. No adenopathy. Upper chest: Visualized upper chest demonstrates no acute finding. Cardiomegaly with coronary artery calcifications partially visualized. Partially visualized lungs are grossly clear. Review of the MIP images confirms the above findings CTA HEAD FINDINGS Anterior circulation: Mild scattered plaque within the petrous ICAs bilaterally without significant narrowing on the right. Similar mild stenosis on the left. Scattered atheromatous plaque throughout the cavernous/supraclinoid ICAs with resultant mild multifocal narrowing, also stable from previous. ICA termini widely patent. A1 segments well perfused. Normal anterior communicating artery. Anterior cerebral arteries widely patent to their distal aspects without stenosis. M1 segments widely patent bilaterally. Normal MCA bifurcations. No proximal M2 occlusion. Distal MCA branches well perfused and symmetric. Posterior circulation: Vertebral arteries widely patent to the vertebrobasilar junction without stenosis. Patent right PICA. Left PICA not visualized. Dominant left AICA. Basilar widely patent to its distal aspect. Superior cerebral arteries patent bilaterally. Both of the posterior cerebral arteries primarily supplied via the basilar. Mild atheromatous irregularity within the PCAs bilaterally without high-grade stenosis. PCAs are patent to their distal aspects. Small right posterior communicating artery noted. Venous sinuses: Grossly patent, although not well assessed due to arterial timing the contrast bolus. Anatomic variants: None  significant. Delayed phase: No abnormal enhancement. Review of the MIP images confirms the above findings IMPRESSION: 1. Negative CTA for emergent large vessel occlusion. 2. Postoperative changes from interval left carotid endarterectomy  without adverse features. No significant residual or recurrent stenosis identified. 3. Atheromatous stenosis of approximately 50% at the right carotid bifurcation, stable. 4. Moderate atherosclerotic change throughout the carotid siphons without high-grade stenosis, similar to previous. No other high-grade or correctable stenosis identified. Electronically Signed   By: Jeannine Boga M.D.   On: 12/31/2017 00:33   Mr Brain Wo Contrast (neuro Protocol)  Result Date: 12/30/2017 CLINICAL DATA:  Gait difficulty. Headache. Intermittent confusion for 3 days. EXAM: MRI HEAD WITHOUT CONTRAST TECHNIQUE: Multiplanar, multiecho pulse sequences of the brain and surrounding structures were obtained without intravenous contrast. COMPARISON:  Head CT 11/29/2017 and MRI 06/23/2017 FINDINGS: Brain: A 1.3 cm region of restricted diffusion involving the posterior right corona radiata is consistent with an acute lateral lenticulostriate territory infarct. Scattered small areas of mild T2 shine through are noted on diffusion imaging elsewhere in both cerebral hemispheres. One or two foci of chronic microhemorrhage are noted in the right frontal and right parietal lobes. Patchy chronic T2 hyperintensities in the cerebral white matter and pons are similar to the prior MRI and are nonspecific but compatible with moderate chronic small vessel ischemic disease, advanced for age. Chronic lacunar infarcts are again seen in the cerebral white matter bilaterally, right thalamus, and pons. There is moderate cerebral atrophy. There is no midline shift or extra-axial fluid collection. Vascular: Major intracranial vascular flow voids are preserved. Skull and upper cervical spine: Unremarkable bone marrow  signal. Sinuses/Orbits: Unremarkable orbits. Clear paranasal sinuses. Trace bilateral mastoid effusions. Other: None. IMPRESSION: 1. Acute right corona radiata infarct. 2. Moderate chronic small vessel ischemic disease. Electronically Signed   By: Logan Bores M.D.   On: 12/30/2017 18:45   2D echocardiogram - Left ventricle: The cavity size was normal. There was mild   concentric hypertrophy. Systolic function was normal. The   estimated ejection fraction was in the range of 50% to 55%.   Probable hypokinesis of the apical anterolateral and   inferolateral myocardium; consistent with ischemia in the   distribution of a diagonal/ramus intermedius artery or proximal   OM branch of the left circumflex coronary artery. Doppler   parameters are consistent with abnormal left ventricular   relaxation (grade 1 diastolic dysfunction). Doppler parameters   are consistent with elevated mean left atrial filling pressure. A   mid-diastolic &quot;L&quot; wave is seen, consistent with elevated left   atrial pressure. - Mitral valve: There was mild regurgitation. - Left atrium: The atrium was mildly dilated. - Atrial septum: No defect or patent foramen ovale was identified.  Impressions:  - Retrospectively, a similar LV wall motion abnormality is seen on   studies from May 2019 and 2016, although not reported.   PHYSICAL EXAM Middle aged Caucasian lady not in distress.  . Afebrile. Head is nontraumatic. Neck is supple without bruit.    Cardiac exam no murmur or gallop. Lungs are clear to auscultation. Distal pulses are well felt.puffy face Neurological Exam :  Awake alert oriented x 3 normal speech and language. Mild left lower face asymmetry. Tongue midline. No drift.but mild left hemiparesis 4/5 strength. Mild diminished fine finger movements on left. Orbits right over left upper extremity. Mild left grip weak.. Normal sensation . Normal coordination.  ASSESSMENT/PLAN [name] is a 67  y.o. female with history of diabetes, tobacco abuse, HLD presenting with unsteady gait x3 days.   Stroke:  right corona infarct embolic secondary to known atrial fibrillation on Eliquis  MRI  R corona radiata infarct. Small vessel  disease.   CTA head & neck no LVO. Post op changes from L CEA. R ICA 50% bifurcation. Mod atherosclerosis carotids.  2D Echo EF 55 to 60%.  No PFO noted  LDL 45  HgbA1c 9.3  eliquis for VTE prophylaxis Diet Order            Diet Carb Modified Fluid consistency: Thin; Room service appropriate? Yes  Diet effective now              Eliquis (apixaban) daily prior to admission (has not been taking aspirin as prescribed), now on Eliquis (apixaban) daily. Given cardiac history, will add aspirin   Therapy recommendations:  pending   Disposition:  pending   Atrial Fibrillation  Home anticoagulation:  Eliquis (apixaban) daily continued in the hospital  On Tikosyn . Continue Eliquis (apixaban) daily at discharge   Hypertension  Stable . Permissive hypertension (OK if < 220/120) but gradually normalize in 5-7 days . Long-term BP goal normotensive  Hyperlipidemia  Home meds:  crestor 5, resumed in hospital  LDL 45, goal < 70  Continue statin at discharge  Diabetes type II Diabetic neuropathy  HgbA1c 9.3, goal < 7.0  Uncontrolled  Other Stroke Risk Factors  Advanced age  Cigarette smoker, advised to stop smoking  ETOH use, advised to drink no more than 1 drink(s) a day  Hx stroke/TIA  02/2009 non-acute Lacunar infarct of the right thalamus   Obesity, There is no height or weight on file to calculate BMI.   Coronary artery disease, MI  Carotid stenosis  Other Active Problems  Mnire's disease.  Deaf in left ear.  Bilateral leg edema due to lymphedema  COPD  Acute metabolic encephalopathy  Depression  Hospital day # 1  Burnetta Sabin, MSN, APRN, ANVP-BC, AGPCNP-BC Advanced Practice Stroke Nurse [place] for Schedule & Pager information 12/31/2017 4:35 PM  I have personally examined this patient, reviewed notes, independently viewed imaging studies, participated in medical decision making and plan of care.ROS completed by me personally and pertinent positives fully documented  I have made any additions or clarifications directly to the above note. Agree with note above. She has presented with right subcortical infarct likely from small vessel disease but also has atrial fibrillation and is on eliquis. Recommend aggressive risk factor modification. Does not any definite data suggesting switching eliquis to Xarelto upper extremity be any better. Continue ongoing stroke workup. Discussed with Dr. Tana Coast. Greater than 50% time during this 35 minute visit was spent on counseling and coordination of care about her lacunar infarct, discussion about atrial fibrillation and stroke risk and answered questions  Antony Contras, MD Medical Director [place] Pager: [phone] 12/31/2017 5:10 PM  To contact Stroke Continuity provider, please refer to [email or web address] After hours, contact General Neurology

## 2017-12-31 NOTE — Progress Notes (Signed)
Inpatient Diabetes Program Recommendations  AACE/ADA: New Consensus Statement on Inpatient Glycemic Control (2015)  Target Ranges:  Prepandial:   less than 140 mg/dL      Peak postprandial:   less than 180 mg/dL (1-2 hours)      Critically ill patients:  140 - 180 mg/dL   Results for PHOEBIE, SHAD (MRN [id number]) as of 12/31/2017 14:40  Ref. Range 12/31/2017 01:31 12/31/2017 09:17 12/31/2017 13:45  Glucose-Capillary Latest Ref Range: 70 - 99 mg/dL 201 (H) 154 (H) 256 (H)   Review of Glycemic Control  Diabetes history: DM 2 Outpatient Diabetes medications: Synjardy (Empagliflozin-Metformin) 12.06-998 mg BID Current orders for Inpatient glycemic control: Novolog 0-9 units tid, + Novolog hs scale  Inpatient Diabetes Program Recommendations:    A1c 9.3% on 11/12  Glucose trends elevated. Consider increasing Novolog Correction to 0-15 units tid.  Thanks,  Tama Headings RN, MSN, BC-ADM Inpatient Diabetes Coordinator Team Pager [phone] (8a-5p)

## 2017-12-31 NOTE — Evaluation (Signed)
Physical Therapy Evaluation Patient Details Name: Melanie Lucas MRN: [id number] DOB: 1950-06-02 Today's Date: 12/31/2017   History of Present Illness  Pt is a 67 y/o female admitted secondary to unsteady gait and dysarthria. Imaging revealed R corona radiata infarct. PMH includes HTN, a fib, CAD, COPD, DM, and tobacco abuse.   Clinical Impression  Pt admitted secondary to problem above with deficits below. Pt with decreased coordination and L extremity weakness. Required min A and HHA for steadying assist this session. Educated about CIR, however, pt currently refusing stating she was going home and going to the beach this weekend. Educated about likely need for AD to improve balance. Will continue to follow acutely to maximize functional mobility independence and safety.     Follow Up Recommendations Other (comment);Supervision for mobility/OOB(HHPT vs outpatient neuro; refusing CIR )    Equipment Recommendations  None recommended by PT    Recommendations for Other Services       Precautions / Restrictions Precautions Precautions: Fall Restrictions Weight Bearing Restrictions: No      Mobility  Bed Mobility Overal bed mobility: Needs Assistance Bed Mobility: Supine to Sit;Sit to Supine     Supine to sit: Supervision Sit to supine: Supervision   General bed mobility comments: Supervision for safety.   Transfers Overall transfer level: Needs assistance Equipment used: 1 person hand held assist Transfers: Sit to/from Stand Sit to Stand: Min assist         General transfer comment: Min A for lift assist and steadying.   Ambulation/Gait Ambulation/Gait assistance: Min assist Gait Distance (Feet): 15 Feet Assistive device: 1 person hand held assist Gait Pattern/deviations: Step-through pattern;Decreased stride length Gait velocity: Decreased    General Gait Details: Unsteady gait during forward and backward ambulation. Min A for steadying assist.   Stairs             Wheelchair Mobility    Modified Rankin (Stroke Patients Only) Modified Rankin (Stroke Patients Only) Pre-Morbid Rankin Score: No symptoms Modified Rankin: Moderately severe disability     Balance Overall balance assessment: Needs assistance Sitting-balance support: No upper extremity supported;Feet supported Sitting balance-Leahy Scale: Fair     Standing balance support: Single extremity supported;During functional activity Standing balance-Leahy Scale: Poor Standing balance comment: Reliant on external assist and HHA                             Pertinent Vitals/Pain Pain Assessment: 0-10 Pain Score: 3  Pain Location: headache  Pain Descriptors / Indicators: Headache Pain Intervention(s): Monitored during session;Limited activity within patient's tolerance;Repositioned    Home Living Family/patient expects to be discharged to:: Private residence Living Arrangements: Spouse/significant other;Non-relatives/Friends Available Help at Discharge: Family;Available 24 hours/day Type of Home: House Home Access: Stairs to enter Entrance Stairs-Rails: Psychiatric nurse of Steps: 3 Home Layout: Two level;Able to live on main level with bedroom/bathroom Home Equipment: Gilford Rile - 2 wheels;Cane - single point      Prior Function Level of Independence: Independent               Hand Dominance        Extremity/Trunk Assessment   Upper Extremity Assessment Upper Extremity Assessment: Defer to OT evaluation    Lower Extremity Assessment Lower Extremity Assessment: LLE deficits/detail LLE Deficits / Details: Grossly 4-/5 throughout LLE. Decreased coordination with heel to shin test.  LLE Coordination: decreased gross motor    Cervical / Trunk Assessment Cervical / Trunk Assessment: Normal  Communication   Communication: No difficulties  Cognition Arousal/Alertness: Awake/alert Behavior During Therapy: WFL for tasks  assessed/performed Overall Cognitive Status: Within Functional Limits for tasks assessed                                        General Comments General comments (skin integrity, edema, etc.): Pt's husband present during session. Educated about CIR, however, pt currently refusing stating she needed to go to the beach this weekend.     Exercises     Assessment/Plan    PT Assessment Patient needs continued PT services  PT Problem List Decreased strength;Decreased balance;Decreased mobility;Decreased knowledge of use of DME;Decreased knowledge of precautions;Pain       PT Treatment Interventions DME instruction;Gait training;Functional mobility training;Stair training;Therapeutic activities;Therapeutic exercise;Balance training;Patient/family education    PT Goals (Current goals can be found in the Care Plan section)  Acute Rehab PT Goals Patient Stated Goal: to go home and go to the beach this weekend PT Goal Formulation: With patient Time For Goal Achievement: 01/14/18 Potential to Achieve Goals: Good    Frequency Min 4X/week   Barriers to discharge        Co-evaluation               AM-PAC PT "6 Clicks" Daily Activity  Outcome Measure Difficulty turning over in bed (including adjusting bedclothes, sheets and blankets)?: A Little Difficulty moving from lying on back to sitting on the side of the bed? : A Little Difficulty sitting down on and standing up from a chair with arms (e.g., wheelchair, bedside commode, etc,.)?: Unable Help needed moving to and from a bed to chair (including a wheelchair)?: A Little Help needed walking in hospital room?: A Little Help needed climbing 3-5 steps with a railing? : A Lot 6 Click Score: 15    End of Session Equipment Utilized During Treatment: Gait belt Activity Tolerance: Patient tolerated treatment well Patient left: in bed;with call bell/phone within reach;with family/visitor present Nurse Communication:  Mobility status PT Visit Diagnosis: Unsteadiness on feet (R26.81);Muscle weakness (generalized) (M62.81)    Time: 1358-1410 PT Time Calculation (min) (ACUTE ONLY): 12 min   Charges:   PT Evaluation $PT Eval Low Complexity: [place], PT, DPT  Acute Rehabilitation Services  Pager: [phone] Office: [phone]   Rudean Hitt 12/31/2017, 3:45 PM

## 2017-12-31 NOTE — ED Notes (Signed)
Pt continues to have unsteady gait, drags L foot at times

## 2017-12-31 NOTE — Progress Notes (Signed)
[facility]                                                                              Patient Demographics  Melanie Lucas, is a 67 y.o. female, DOB - 12-Jul-1950, YIR:[id number]  Admit date - 12/30/2017   Admitting Physician Ivor Costa, MD  Outpatient Primary MD for the patient is Via, Lennette Bihari, MD  Outpatient specialists:   LOS - 1  days   Medical records reviewed and are as summarized below:    Chief Complaint  Patient presents with  . Gait Problem  . Dizziness       Brief summary   Patient is a 67 year old female with hypertension, hyperlipidemia, diabetes, COPD, PAF on Eliquis, diastolic CHF, presented with unsteady gait, difficulty ambulating and dysarthria.  Patient had difficulty walking in the past 3 days, headache with difficulty speaking and mild confusion.  MRI of the brain showed acute right corona radiata infarct.  Patient was admitted for further work-up.  Assessment & Plan    Principal Problem: Acute ischemic stroke [facility]), right coronary radiata infarct on MRI, nonhemorrhagic -Patient presented with difficulty in bleeding, dysarthria and unsteady gait.  Currently improving per patient -MRI brain showed acute right coronary radiata infarct, moderate chronic small vessel ischemic disease -Complete stroke work-up, 2D echo, PT OT, SLP -CTA head and neck showed no emergent large vessel occlusion -Neuro consulted, currently on aspirin 81 mg daily and Eliquis, will defer to stroke team recommendations -LDL 45  Active Problems:   TOBACCO ABUSE -Continue nicotine patch    Essential hypertension, benign -Permissive hypertension due to acute CVA  Chronic A-fib (HCC) -Continue Tikosyn, Eliquis    CAD (coronary artery disease) -Currently stable, no chest pain, continue Crestor, aspirin    HLD (hyperlipidemia) -Continue current    Diabetes mellitus without complication (HCC) -Hemoglobin A1c 9.3, poorly controlled - Continue sliding scale  insulin    Bilateral leg edema -Continue Lasix    COPD (chronic obstructive pulmonary disease) (HCC) -Continue albuterol nebs as needed    Acute metabolic encephalopathy -Resolved, oriented x3   Code Status: Full Code DVT Prophylaxis: Eliquis Family Communication: Discussed in detail with the patient, all imaging results, lab results explained to the patient    Disposition Plan:   Time Spent in minutes 35 minutes  Procedures:  MRI brain   Consultants:   Neurology  Antimicrobials:      Medications  Scheduled Meds: . apixaban  5 mg Oral BID  . aspirin EC  81 mg Oral Daily  . dofetilide  500 mcg Oral BID  . DULoxetine  40 mg Oral Daily  . furosemide  20 mg Oral Daily  . insulin aspart  0-5 Units Subcutaneous QHS  . insulin aspart  0-9 Units Subcutaneous TID WC  . multivitamin with minerals  1 tablet Oral QHS  . nicotine  21 mg Transdermal Daily  . rosuvastatin  5 mg Oral QHS   Continuous Infusions: PRN Meds:.acetaminophen **OR** acetaminophen (TYLENOL) oral liquid 160 mg/5 mL **OR** acetaminophen, pseudoephedrine **AND** acetaminophen, albuterol, hydrALAZINE, ondansetron (ZOFRAN) IV, oxyCODONE-acetaminophen, senna-docusate, zolpidem   Antibiotics   Anti-infectives (From admission, onward)   None  Subjective:   Melanie Lucas was seen and examined today. Patient denies dizziness, chest pain, shortness of breath, abdominal pain, N/V/D/C.  No new weakness this morning  Objective:   Vitals:   12/30/17 2030 12/31/17 0238 12/31/17 1028 12/31/17 1104  BP: (!) 107/53 (!) 164/61 (!) 159/79 (!) 159/91  Pulse: 71 61 63 62  Resp: (!) 28 18 18 18   Temp:      TempSrc:      SpO2: 96% 97% 100% 98%   No intake or output data in the 24 hours ending 12/31/17 1227   Wt Readings from Last 3 Encounters:  11/29/17 73.9 kg  08/13/17 73.5 kg  07/29/17 77.6 kg     Exam  General: Alert and oriented x 3, NAD  Eyes: PERRLA, EOMI,  HEENT:   Cardiovascular:  S1 S2 auscultated, Regular rate and rhythm.  Respiratory: Clear to auscultation bilaterally, no wheezing, rales or rhonchi  Gastrointestinal: Soft, nontender, nondistended, + bowel sounds  Ext: no pedal edema bilaterally  Neuro: left lower extremity 4/5 left upper extremity, 5/5, right side 5/5  Musculoskeletal: No digital cyanosis, clubbing  Skin: No rashes  Psych: Normal affect and demeanor, alert and oriented x3    Data Reviewed:  I have personally reviewed following labs and imaging studies  Micro Results No results found for this or any previous visit (from the past 240 hour(s)).  Radiology Reports Ct Angio Head W Or Wo Contrast  Result Date: 12/31/2017 CLINICAL DATA:  Initial evaluation for acute stroke. EXAM: CT ANGIOGRAPHY HEAD AND NECK TECHNIQUE: Multidetector CT imaging of the head and neck was performed using the standard protocol during bolus administration of intravenous contrast. Multiplanar CT image reconstructions and MIPs were obtained to evaluate the vascular anatomy. Carotid stenosis measurements (when applicable) are obtained utilizing NASCET criteria, using the distal internal carotid diameter as the denominator. CONTRAST:  38mL ISOVUE-370 IOPAMIDOL (ISOVUE-370) INJECTION 76% COMPARISON:  Previous MRI from earlier the same day as well as previous CTA from 06/23/2017. FINDINGS: CT HEAD FINDINGS Brain: Atrophy with moderate chronic small vessel ischemic disease again noted. Subtle vaulting ischemic infarct at the posterior right corona radiata grossly stable from previous MRI. No evidence for associated hemorrhage. No acute intracranial hemorrhage. No other acute large vessel territory infarct. No mass lesion, midline shift or mass effect. No hydrocephalus. No extra-axial fluid collection. Vascular: No hyperdense vessel. Scattered vascular calcifications noted within the carotid siphons. Skull: Scalp soft tissues and calvarium within normal limits. Sinuses: Mild scattered  mucosal thickening within the ethmoidal air cells. Visualized paranasal sinuses are otherwise clear. No mastoid effusion. Orbits: Globes orbital soft tissues demonstrate no acute finding. CTA NECK FINDINGS Aortic arch: Visualized aortic arch of normal caliber with normal 3 vessel morphology. Moderate atheromatous plaque within the visualize arch and about the origin of the great vessels without hemodynamically significant stenosis. Visualized subclavian arteries widely patent. Prominent coronary artery calcifications partially visualized. Right carotid system: Right common carotid artery patent from its origin to the bifurcation without stenosis. A centric calcified plaque at the proximal right ICA with associated stenosis of up to 50% by NASCET criteria, similar to previous. Right ICA tortuous but otherwise widely patent distally to the skull base without stenosis, dissection, or occlusion. Left carotid system: Left common carotid artery patent from its origin to the bifurcation without stenosis. Sequelae of prior endarterectomy noted at the left bifurcation/proximal left ICA. No significant residual or recurrent stenosis about the endarterectomy site. Left ICA widely patent distally to the skull base without  stenosis, dissection, or occlusion. Vertebral arteries: Both of the vertebral arteries arise from the subclavian arteries. Vertebral arteries widely patent within the neck without stenosis, dissection, or occlusion. Skeleton: No acute osseus abnormality. Mild wedging of the superior endplate of T3 grossly stable from previous. No discrete lytic or blastic osseous lesions. Prior ACDF at C5 through C7 with C6 corpectomy. Patient is edentulous. Other neck: No acute soft tissue abnormality within the neck. Salivary glands normal. Few scattered subcentimeter hypodense thyroid nodules noted, of doubtful significance given size and patient age. No adenopathy. Upper chest: Visualized upper chest demonstrates no acute  finding. Cardiomegaly with coronary artery calcifications partially visualized. Partially visualized lungs are grossly clear. Review of the MIP images confirms the above findings CTA HEAD FINDINGS Anterior circulation: Mild scattered plaque within the petrous ICAs bilaterally without significant narrowing on the right. Similar mild stenosis on the left. Scattered atheromatous plaque throughout the cavernous/supraclinoid ICAs with resultant mild multifocal narrowing, also stable from previous. ICA termini widely patent. A1 segments well perfused. Normal anterior communicating artery. Anterior cerebral arteries widely patent to their distal aspects without stenosis. M1 segments widely patent bilaterally. Normal MCA bifurcations. No proximal M2 occlusion. Distal MCA branches well perfused and symmetric. Posterior circulation: Vertebral arteries widely patent to the vertebrobasilar junction without stenosis. Patent right PICA. Left PICA not visualized. Dominant left AICA. Basilar widely patent to its distal aspect. Superior cerebral arteries patent bilaterally. Both of the posterior cerebral arteries primarily supplied via the basilar. Mild atheromatous irregularity within the PCAs bilaterally without high-grade stenosis. PCAs are patent to their distal aspects. Small right posterior communicating artery noted. Venous sinuses: Grossly patent, although not well assessed due to arterial timing the contrast bolus. Anatomic variants: None significant. Delayed phase: No abnormal enhancement. Review of the MIP images confirms the above findings IMPRESSION: 1. Negative CTA for emergent large vessel occlusion. 2. Postoperative changes from interval left carotid endarterectomy without adverse features. No significant residual or recurrent stenosis identified. 3. Atheromatous stenosis of approximately 50% at the right carotid bifurcation, stable. 4. Moderate atherosclerotic change throughout the carotid siphons without high-grade  stenosis, similar to previous. No other high-grade or correctable stenosis identified. Electronically Signed   By: Jeannine Boga M.D.   On: 12/31/2017 00:33   Ct Angio Neck W Or Wo Contrast  Result Date: 12/31/2017 CLINICAL DATA:  Initial evaluation for acute stroke. EXAM: CT ANGIOGRAPHY HEAD AND NECK TECHNIQUE: Multidetector CT imaging of the head and neck was performed using the standard protocol during bolus administration of intravenous contrast. Multiplanar CT image reconstructions and MIPs were obtained to evaluate the vascular anatomy. Carotid stenosis measurements (when applicable) are obtained utilizing NASCET criteria, using the distal internal carotid diameter as the denominator. CONTRAST:  78mL ISOVUE-370 IOPAMIDOL (ISOVUE-370) INJECTION 76% COMPARISON:  Previous MRI from earlier the same day as well as previous CTA from 06/23/2017. FINDINGS: CT HEAD FINDINGS Brain: Atrophy with moderate chronic small vessel ischemic disease again noted. Subtle vaulting ischemic infarct at the posterior right corona radiata grossly stable from previous MRI. No evidence for associated hemorrhage. No acute intracranial hemorrhage. No other acute large vessel territory infarct. No mass lesion, midline shift or mass effect. No hydrocephalus. No extra-axial fluid collection. Vascular: No hyperdense vessel. Scattered vascular calcifications noted within the carotid siphons. Skull: Scalp soft tissues and calvarium within normal limits. Sinuses: Mild scattered mucosal thickening within the ethmoidal air cells. Visualized paranasal sinuses are otherwise clear. No mastoid effusion. Orbits: Globes orbital soft tissues demonstrate no acute finding. CTA NECK  FINDINGS Aortic arch: Visualized aortic arch of normal caliber with normal 3 vessel morphology. Moderate atheromatous plaque within the visualize arch and about the origin of the great vessels without hemodynamically significant stenosis. Visualized subclavian  arteries widely patent. Prominent coronary artery calcifications partially visualized. Right carotid system: Right common carotid artery patent from its origin to the bifurcation without stenosis. A centric calcified plaque at the proximal right ICA with associated stenosis of up to 50% by NASCET criteria, similar to previous. Right ICA tortuous but otherwise widely patent distally to the skull base without stenosis, dissection, or occlusion. Left carotid system: Left common carotid artery patent from its origin to the bifurcation without stenosis. Sequelae of prior endarterectomy noted at the left bifurcation/proximal left ICA. No significant residual or recurrent stenosis about the endarterectomy site. Left ICA widely patent distally to the skull base without stenosis, dissection, or occlusion. Vertebral arteries: Both of the vertebral arteries arise from the subclavian arteries. Vertebral arteries widely patent within the neck without stenosis, dissection, or occlusion. Skeleton: No acute osseus abnormality. Mild wedging of the superior endplate of T3 grossly stable from previous. No discrete lytic or blastic osseous lesions. Prior ACDF at C5 through C7 with C6 corpectomy. Patient is edentulous. Other neck: No acute soft tissue abnormality within the neck. Salivary glands normal. Few scattered subcentimeter hypodense thyroid nodules noted, of doubtful significance given size and patient age. No adenopathy. Upper chest: Visualized upper chest demonstrates no acute finding. Cardiomegaly with coronary artery calcifications partially visualized. Partially visualized lungs are grossly clear. Review of the MIP images confirms the above findings CTA HEAD FINDINGS Anterior circulation: Mild scattered plaque within the petrous ICAs bilaterally without significant narrowing on the right. Similar mild stenosis on the left. Scattered atheromatous plaque throughout the cavernous/supraclinoid ICAs with resultant mild  multifocal narrowing, also stable from previous. ICA termini widely patent. A1 segments well perfused. Normal anterior communicating artery. Anterior cerebral arteries widely patent to their distal aspects without stenosis. M1 segments widely patent bilaterally. Normal MCA bifurcations. No proximal M2 occlusion. Distal MCA branches well perfused and symmetric. Posterior circulation: Vertebral arteries widely patent to the vertebrobasilar junction without stenosis. Patent right PICA. Left PICA not visualized. Dominant left AICA. Basilar widely patent to its distal aspect. Superior cerebral arteries patent bilaterally. Both of the posterior cerebral arteries primarily supplied via the basilar. Mild atheromatous irregularity within the PCAs bilaterally without high-grade stenosis. PCAs are patent to their distal aspects. Small right posterior communicating artery noted. Venous sinuses: Grossly patent, although not well assessed due to arterial timing the contrast bolus. Anatomic variants: None significant. Delayed phase: No abnormal enhancement. Review of the MIP images confirms the above findings IMPRESSION: 1. Negative CTA for emergent large vessel occlusion. 2. Postoperative changes from interval left carotid endarterectomy without adverse features. No significant residual or recurrent stenosis identified. 3. Atheromatous stenosis of approximately 50% at the right carotid bifurcation, stable. 4. Moderate atherosclerotic change throughout the carotid siphons without high-grade stenosis, similar to previous. No other high-grade or correctable stenosis identified. Electronically Signed   By: Jeannine Boga M.D.   On: 12/31/2017 00:33   Mr Brain Wo Contrast (neuro Protocol)  Result Date: 12/30/2017 CLINICAL DATA:  Gait difficulty. Headache. Intermittent confusion for 3 days. EXAM: MRI HEAD WITHOUT CONTRAST TECHNIQUE: Multiplanar, multiecho pulse sequences of the brain and surrounding structures were obtained  without intravenous contrast. COMPARISON:  Head CT 11/29/2017 and MRI 06/23/2017 FINDINGS: Brain: A 1.3 cm region of restricted diffusion involving the posterior right corona radiata is  consistent with an acute lateral lenticulostriate territory infarct. Scattered small areas of mild T2 shine through are noted on diffusion imaging elsewhere in both cerebral hemispheres. One or two foci of chronic microhemorrhage are noted in the right frontal and right parietal lobes. Patchy chronic T2 hyperintensities in the cerebral white matter and pons are similar to the prior MRI and are nonspecific but compatible with moderate chronic small vessel ischemic disease, advanced for age. Chronic lacunar infarcts are again seen in the cerebral white matter bilaterally, right thalamus, and pons. There is moderate cerebral atrophy. There is no midline shift or extra-axial fluid collection. Vascular: Major intracranial vascular flow voids are preserved. Skull and upper cervical spine: Unremarkable bone marrow signal. Sinuses/Orbits: Unremarkable orbits. Clear paranasal sinuses. Trace bilateral mastoid effusions. Other: None. IMPRESSION: 1. Acute right corona radiata infarct. 2. Moderate chronic small vessel ischemic disease. Electronically Signed   By: Logan Bores M.D.   On: 12/30/2017 18:45    Lab Data:  CBC: Recent Labs  Lab 12/30/17 1344 12/30/17 1405  WBC 6.6  --   NEUTROABS 4.3  --   HGB 16.0* 15.6*  HCT 47.4* 46.0  MCV 91.7  --   PLT 174  --    Basic Metabolic Panel: Recent Labs  Lab 12/30/17 1344 12/30/17 1405 12/31/17 0233  NA 140 140  --   K 3.4* 3.5  --   CL 104 103  --   CO2 28  --   --   GLUCOSE 269* 274*  --   BUN 8 9  --   CREATININE 0.71 0.60  --   CALCIUM 8.9  --   --   MG  --   --  2.0   GFR: CrCl cannot be calculated (Unknown ideal weight.). Liver Function Tests: Recent Labs  Lab 12/30/17 1344  AST 19  ALT 17  ALKPHOS 82  BILITOT 0.8  PROT 5.9*  ALBUMIN 3.4*   No results  for input(s): LIPASE, AMYLASE in the last 168 hours. No results for input(s): AMMONIA in the last 168 hours. Coagulation Profile: Recent Labs  Lab 12/30/17 1344  INR 1.05   Cardiac Enzymes: No results for input(s): CKTOTAL, CKMB, CKMBINDEX, TROPONINI in the last 168 hours. BNP (last 3 results) No results for input(s): PROBNP in the last 8760 hours. HbA1C: Recent Labs    12/31/17 0233  HGBA1C 9.3*   CBG: Recent Labs  Lab 12/30/17 1335 12/31/17 0131 12/31/17 0917  GLUCAP 262* 201* 154*   Lipid Profile: Recent Labs    12/31/17 0233  CHOL 114  HDL 46  LDLCALC 45  TRIG 114  CHOLHDL 2.5   Thyroid Function Tests: No results for input(s): TSH, T4TOTAL, FREET4, T3FREE, THYROIDAB in the last 72 hours. Anemia Panel: No results for input(s): VITAMINB12, FOLATE, FERRITIN, TIBC, IRON, RETICCTPCT in the last 72 hours. Urine analysis:    Component Value Date/Time   COLORURINE YELLOW 12/30/2017 1344   APPEARANCEUR CLEAR 12/30/2017 1344   LABSPEC 1.038 (H) 12/30/2017 1344   PHURINE 6.0 12/30/2017 1344   GLUCOSEU >=500 (A) 12/30/2017 1344   HGBUR NEGATIVE 12/30/2017 1344   BILIRUBINUR NEGATIVE 12/30/2017 1344   BILIRUBINUR negative 10/17/2015 1839   KETONESUR 5 (A) 12/30/2017 1344   PROTEINUR NEGATIVE 12/30/2017 1344   UROBILINOGEN 0.2 10/17/2015 1839   UROBILINOGEN 1.0 09/20/2010 0025   NITRITE NEGATIVE 12/30/2017 1344   LEUKOCYTESUR NEGATIVE 12/30/2017 1344     Ripudeep Rai M.D. [facility] 12/31/2017, 12:27 PM  Pager: [phone] Between 7am to  7pm - call Pager - [phone]  After 7pm go to [email or web address] - password [email or web address]  Call night coverage person covering after 7pm   

## 2018-01-01 DIAGNOSIS — I2510 Atherosclerotic heart disease of native coronary artery without angina pectoris: Secondary | ICD-10-CM

## 2018-01-01 LAB — GLUCOSE, CAPILLARY
GLUCOSE-CAPILLARY: 251 mg/dL — AB (ref 70–99)
Glucose-Capillary: 174 mg/dL — ABNORMAL HIGH (ref 70–99)
Glucose-Capillary: 261 mg/dL — ABNORMAL HIGH (ref 70–99)

## 2018-01-01 NOTE — Discharge Summary (Signed)
Physician Discharge Summary  [name]:[id number] DOB: [birth year] DOA: 12/30/2017  PCP: Dineen Kid, MD  Admit date: 12/30/2017 Discharge date: 01/01/2018  Time spent: 45 minutes  Recommendations for Outpatient Follow-up:  Patient will be discharged to home with home health physical and occupational therapy.  Patient will need to follow up with primary care provider within one week of discharge.  Follow up with neurology in 4 weeks. Patient should continue medications as prescribed.  Patient should follow a heart healthy/carb modified diet.   Discharge Diagnoses:  Acute ischemic stroke, right corona radiata infarct on MRI, nonhemorrhagic Tobacco abuse Essential hypertension Chronic atrial fibrillation Coronary artery disease Hyperlipidemia Diabetes mellitus, type II Bilateral leg edema COPD Acute metabolic encephalopathy  Discharge Condition: Stable  Diet recommendation: heart healthy/carb modified  There were no vitals filed for this visit.  History of present illness:  On 12/30/2017 by Dr. Ivor Costa Melanie Lucas is a 67 y.o. female with medical history significant of hypertension, hyperlipidemia, diabetes mellitus, COPD, lymphedema in both legs, CAD, PAF on Eliquis, tobacco abuse, dCHF, depression, anxiety, who presents with unsteady gait, difficulty walking, difficulty speaking.  Pt states that has been having difficulty walking in the past 3 days.  She was reportedly to have unsteady gait, and difficult speaking words out. She also had headache.  Patient has mild confusion, but is still oriented x3 currenlty.  She states that she has right ear hearing loss due to history of Mnire's disease, which has not changed.  No vision loss.  Patient states that has chronic hand tingling sometimes, which is a chronic issue, no change recently.  He denies chest pain, shortness breath, cough, nausea, vomiting, diarrhea, abdominal pain, symptoms of UTI.  Hospital Course:  Acute  ischemic stroke, right corona radiata infarct on MRI, nonhemorrhagic -Patient presented with dysarthria and unsteady gait -Appears to be improving -MRI brain showed acute right corona radiata infarct -CTA neck and head showed no emergent large vessel occlusion -Neurology consulted and appreciated, recommended continuing Eliquis and having patient speak to her cardiologist regarding the continuation of aspirin  -Echocardiogram shows an EF of 50 to 56%, grade 1 diastolic dysfunction.  Studies May 2019, 2016 -LDL 45, hemoglobin A1c 9.3 -PT recommending HH (as patient refused CIR) -Patient, she states that her husband will have knee replacement surgery soon and she will need to be at home taking care of him. -Recommended the patient follow-up with her neurologist within 4 weeks.  Tobacco abuse -Continue nicotine patch  Essential hypertension -stable, allowed for permissive HTN during hospitalization   Chronic atrial fibrillation -Continue Tikosyn, Eliquis  Coronary artery disease -Currently chest pain-free -Continue statin, aspirin  Hyperlipidemia -Continue statin  Diabetes mellitus, type II -Hemoglobin A1c 9.3 -Continue Synjardy and prandin on discharge -Follow up with PCP for better control of blood sugars  Bilateral leg edema -Continue Lasix  COPD -Stable, no wheezing on exam -Continue home medications  Acute metabolic encephalopathy -Likely secondary to CVA -Resolved, patient currently alert and oriented x3  Procedures: Echocardiogram  Consultations: Neurology  Discharge Exam: Vitals:   01/01/18 0420 01/01/18 0800  BP: (!) 173/66 (!) 152/80  Pulse: (!) 53 (!) 58  Resp: 18 18  Temp: 98.8 F (37.1 C) (!) 97.5 F (36.4 C)  SpO2: 95% 98%   No complaints today.  Patient wanting to go home today.  Denies current chest pain, shortness breath, abdominal pain, nausea vomiting, diarrhea constipation, dizziness or headache.  Does complain of tremor for which she sees  a  neurologist for.   General: Well developed, well nourished, NAD, appears stated age  22: NCAT, mucous membranes moist.  Neck: Supple  Cardiovascular: S1 S2 auscultated,RRR  Respiratory: Clear to auscultation bilaterally with equal chest rise  Abdomen: Soft, nontender, nondistended, + bowel sounds  Extremities: warm dry without cyanosis clubbing or edema  Neuro: AAOx3, nonfocal, slightly weaker LLE 4/5, otherwise strength 5/5  Psych: Appropriate mood and affect  Discharge Instructions Discharge Instructions    Diet - low sodium heart healthy   Complete by:  As directed    Discharge instructions   Complete by:  As directed    Patient will be discharged to home with home health physical and occupational therapy.  Patient will need to follow up with primary care provider within one week of discharge.  Follow up with neurology in 4 weeks. Patient should continue medications as prescribed.  Patient should follow a heart healthy/carb modified diet.     Allergies as of 01/01/2018      Reactions   Avelox [moxifloxacin]    Unknown reaction    Chantix [varenicline]    nightmares   Fosamax [alendronate]    Pain all over   Pradaxa [dabigatran Etexilate Mesylate]    Extreme bleeding   Statins Nausea And Vomiting, Other (See Comments)   Muscle pain, Dizziness (intolerance)   Sulfamethoxazole Nausea And Vomiting   Dizziness (intolerance)   Sulfonamide Derivatives Hives   Nausea vertigo   Ciprofloxacin Rash   Gabapentin Rash   burning   Tape Itching, Rash   Please use "paper" tape only      Medication List    STOP taking these medications   enoxaparin 80 MG/0.8ML injection Commonly known as:  LOVENOX     TAKE these medications   albuterol 108 (90 Base) MCG/ACT inhaler Commonly known as:  PROVENTIL HFA;VENTOLIN HFA Inhale 2 puffs into the lungs every 6 (six) hours as needed for wheezing or shortness of breath.   aspirin EC 81 MG tablet Take 1 tablet (81 mg total)  by mouth daily.   dofetilide 500 MCG capsule Commonly known as:  TIKOSYN Take 1 capsule (500 mcg total) 2 (two) times daily by mouth.   DULOXETINE HCL PO Take 40 mg by mouth daily.   ELIQUIS 5 MG Tabs tablet Generic drug:  apixaban TAKE 1 TABLET (5 MG TOTAL) BY MOUTH 2 (TWO) TIMES DAILY.   furosemide 20 MG tablet Commonly known as:  LASIX Take 20 mg by mouth daily.   ibuprofen 800 MG tablet Commonly known as:  ADVIL,MOTRIN Take 800 mg by mouth 3 (three) times daily as needed for mild pain.   multivitamin tablet Take 1 tablet by mouth at bedtime.   nicotine 21 mg/24hr patch Commonly known as:  NICODERM CQ - dosed in mg/24 hours Place 1 patch (21 mg total) onto the skin daily.   oxyCODONE-acetaminophen 5-325 MG tablet Commonly known as:  PERCOCET/ROXICET Take 1 tablet by mouth every 6 (six) hours as needed for moderate pain.   repaglinide 1 MG tablet Commonly known as:  PRANDIN Take 1 mg by mouth 2 (two) times daily.   rosuvastatin 5 MG tablet Commonly known as:  CRESTOR Take 5 mg by mouth at bedtime.   SINUS HEADACHE PE MAX ST 5-325 MG Tabs Generic drug:  Phenylephrine-Acetaminophen Take 1 tablet by mouth daily as needed (sinus headaches).   SYNJARDY 12.06-998 MG Tabs Generic drug:  Empagliflozin-metFORMIN HCl Take 1 tablet by mouth 2 (two) times daily.   VALTREX 500 MG  tablet Generic drug:  valACYclovir Take 500 mg by mouth 2 (two) times daily as needed (for outbreaks).      Allergies  Allergen Reactions  . Avelox [Moxifloxacin]     Unknown reaction   . Chantix [Varenicline]     nightmares  . Fosamax [Alendronate]     Pain all over  . Pradaxa [Dabigatran Etexilate Mesylate]     Extreme bleeding  . Statins Nausea And Vomiting and Other (See Comments)    Muscle pain, Dizziness (intolerance)  . Sulfamethoxazole Nausea And Vomiting    Dizziness (intolerance)  . Sulfonamide Derivatives Hives    Nausea vertigo  . Ciprofloxacin Rash  . Gabapentin Rash      burning  . Tape Itching and Rash    Please use "paper" tape only   Follow-up Information    Via, Lennette Bihari, MD. Schedule an appointment as soon as possible for a visit in 1 week(s).   Specialty:  Family Medicine Why:  Hospital follow up Contact information: [place]  [address] [phone]        Thompson Grayer, MD .   Specialty:  Cardiology Contact information: [address] [place] [address] [phone]        Tat, Eustace Quail, DO. Schedule an appointment as soon as possible for a visit in 2 week(s).   Specialty:  Neurology Why:  Stroke and tremors Contact information: [address] [place] [address] [phone]            The results of significant diagnostics from this hospitalization (including imaging, microbiology, ancillary and laboratory) are listed below for reference.    Significant Diagnostic Studies: Ct Angio Head W Or Wo Contrast  Result Date: 12/31/2017 CLINICAL DATA:  Initial evaluation for acute stroke. EXAM: CT ANGIOGRAPHY HEAD AND NECK TECHNIQUE: Multidetector CT imaging of the head and neck was performed using the standard protocol during bolus administration of intravenous contrast. Multiplanar CT image reconstructions and MIPs were obtained to evaluate the vascular anatomy. Carotid stenosis measurements (when applicable) are obtained utilizing NASCET criteria, using the distal internal carotid diameter as the denominator. CONTRAST:  63mL ISOVUE-370 IOPAMIDOL (ISOVUE-370) INJECTION 76% COMPARISON:  Previous MRI from earlier the same day as well as previous CTA from 06/23/2017. FINDINGS: CT HEAD FINDINGS Brain: Atrophy with moderate chronic small vessel ischemic disease again noted. Subtle vaulting ischemic infarct at the posterior right corona radiata grossly stable from previous MRI. No evidence for associated hemorrhage. No acute intracranial hemorrhage. No other acute large vessel territory  infarct. No mass lesion, midline shift or mass effect. No hydrocephalus. No extra-axial fluid collection. Vascular: No hyperdense vessel. Scattered vascular calcifications noted within the carotid siphons. Skull: Scalp soft tissues and calvarium within normal limits. Sinuses: Mild scattered mucosal thickening within the ethmoidal air cells. Visualized paranasal sinuses are otherwise clear. No mastoid effusion. Orbits: Globes orbital soft tissues demonstrate no acute finding. CTA NECK FINDINGS Aortic arch: Visualized aortic arch of normal caliber with normal 3 vessel morphology. Moderate atheromatous plaque within the visualize arch and about the origin of the great vessels without hemodynamically significant stenosis. Visualized subclavian arteries widely patent. Prominent coronary artery calcifications partially visualized. Right carotid system: Right common carotid artery patent from its origin to the bifurcation without stenosis. A centric calcified plaque at the proximal right ICA with associated stenosis of up to 50% by NASCET criteria, similar to previous. Right ICA tortuous but otherwise widely patent distally to the skull base without  stenosis, dissection, or occlusion. Left carotid system: Left common carotid artery patent from its origin to the bifurcation without stenosis. Sequelae of prior endarterectomy noted at the left bifurcation/proximal left ICA. No significant residual or recurrent stenosis about the endarterectomy site. Left ICA widely patent distally to the skull base without stenosis, dissection, or occlusion. Vertebral arteries: Both of the vertebral arteries arise from the subclavian arteries. Vertebral arteries widely patent within the neck without stenosis, dissection, or occlusion. Skeleton: No acute osseus abnormality. Mild wedging of the superior endplate of T3 grossly stable from previous. No discrete lytic or blastic osseous lesions. Prior ACDF at C5 through C7 with C6 corpectomy.  Patient is edentulous. Other neck: No acute soft tissue abnormality within the neck. Salivary glands normal. Few scattered subcentimeter hypodense thyroid nodules noted, of doubtful significance given size and patient age. No adenopathy. Upper chest: Visualized upper chest demonstrates no acute finding. Cardiomegaly with coronary artery calcifications partially visualized. Partially visualized lungs are grossly clear. Review of the MIP images confirms the above findings CTA HEAD FINDINGS Anterior circulation: Mild scattered plaque within the petrous ICAs bilaterally without significant narrowing on the right. Similar mild stenosis on the left. Scattered atheromatous plaque throughout the cavernous/supraclinoid ICAs with resultant mild multifocal narrowing, also stable from previous. ICA termini widely patent. A1 segments well perfused. Normal anterior communicating artery. Anterior cerebral arteries widely patent to their distal aspects without stenosis. M1 segments widely patent bilaterally. Normal MCA bifurcations. No proximal M2 occlusion. Distal MCA branches well perfused and symmetric. Posterior circulation: Vertebral arteries widely patent to the vertebrobasilar junction without stenosis. Patent right PICA. Left PICA not visualized. Dominant left AICA. Basilar widely patent to its distal aspect. Superior cerebral arteries patent bilaterally. Both of the posterior cerebral arteries primarily supplied via the basilar. Mild atheromatous irregularity within the PCAs bilaterally without high-grade stenosis. PCAs are patent to their distal aspects. Small right posterior communicating artery noted. Venous sinuses: Grossly patent, although not well assessed due to arterial timing the contrast bolus. Anatomic variants: None significant. Delayed phase: No abnormal enhancement. Review of the MIP images confirms the above findings IMPRESSION: 1. Negative CTA for emergent large vessel occlusion. 2. Postoperative changes  from interval left carotid endarterectomy without adverse features. No significant residual or recurrent stenosis identified. 3. Atheromatous stenosis of approximately 50% at the right carotid bifurcation, stable. 4. Moderate atherosclerotic change throughout the carotid siphons without high-grade stenosis, similar to previous. No other high-grade or correctable stenosis identified. Electronically Signed   By: Jeannine Boga M.D.   On: 12/31/2017 00:33   Ct Angio Neck W Or Wo Contrast  Result Date: 12/31/2017 CLINICAL DATA:  Initial evaluation for acute stroke. EXAM: CT ANGIOGRAPHY HEAD AND NECK TECHNIQUE: Multidetector CT imaging of the head and neck was performed using the standard protocol during bolus administration of intravenous contrast. Multiplanar CT image reconstructions and MIPs were obtained to evaluate the vascular anatomy. Carotid stenosis measurements (when applicable) are obtained utilizing NASCET criteria, using the distal internal carotid diameter as the denominator. CONTRAST:  56mL ISOVUE-370 IOPAMIDOL (ISOVUE-370) INJECTION 76% COMPARISON:  Previous MRI from earlier the same day as well as previous CTA from 06/23/2017. FINDINGS: CT HEAD FINDINGS Brain: Atrophy with moderate chronic small vessel ischemic disease again noted. Subtle vaulting ischemic infarct at the posterior right corona radiata grossly stable from previous MRI. No evidence for associated hemorrhage. No acute intracranial hemorrhage. No other acute large vessel territory infarct. No mass lesion, midline shift or mass effect. No hydrocephalus. No extra-axial fluid collection.  Vascular: No hyperdense vessel. Scattered vascular calcifications noted within the carotid siphons. Skull: Scalp soft tissues and calvarium within normal limits. Sinuses: Mild scattered mucosal thickening within the ethmoidal air cells. Visualized paranasal sinuses are otherwise clear. No mastoid effusion. Orbits: Globes orbital soft tissues  demonstrate no acute finding. CTA NECK FINDINGS Aortic arch: Visualized aortic arch of normal caliber with normal 3 vessel morphology. Moderate atheromatous plaque within the visualize arch and about the origin of the great vessels without hemodynamically significant stenosis. Visualized subclavian arteries widely patent. Prominent coronary artery calcifications partially visualized. Right carotid system: Right common carotid artery patent from its origin to the bifurcation without stenosis. A centric calcified plaque at the proximal right ICA with associated stenosis of up to 50% by NASCET criteria, similar to previous. Right ICA tortuous but otherwise widely patent distally to the skull base without stenosis, dissection, or occlusion. Left carotid system: Left common carotid artery patent from its origin to the bifurcation without stenosis. Sequelae of prior endarterectomy noted at the left bifurcation/proximal left ICA. No significant residual or recurrent stenosis about the endarterectomy site. Left ICA widely patent distally to the skull base without stenosis, dissection, or occlusion. Vertebral arteries: Both of the vertebral arteries arise from the subclavian arteries. Vertebral arteries widely patent within the neck without stenosis, dissection, or occlusion. Skeleton: No acute osseus abnormality. Mild wedging of the superior endplate of T3 grossly stable from previous. No discrete lytic or blastic osseous lesions. Prior ACDF at C5 through C7 with C6 corpectomy. Patient is edentulous. Other neck: No acute soft tissue abnormality within the neck. Salivary glands normal. Few scattered subcentimeter hypodense thyroid nodules noted, of doubtful significance given size and patient age. No adenopathy. Upper chest: Visualized upper chest demonstrates no acute finding. Cardiomegaly with coronary artery calcifications partially visualized. Partially visualized lungs are grossly clear. Review of the MIP images  confirms the above findings CTA HEAD FINDINGS Anterior circulation: Mild scattered plaque within the petrous ICAs bilaterally without significant narrowing on the right. Similar mild stenosis on the left. Scattered atheromatous plaque throughout the cavernous/supraclinoid ICAs with resultant mild multifocal narrowing, also stable from previous. ICA termini widely patent. A1 segments well perfused. Normal anterior communicating artery. Anterior cerebral arteries widely patent to their distal aspects without stenosis. M1 segments widely patent bilaterally. Normal MCA bifurcations. No proximal M2 occlusion. Distal MCA branches well perfused and symmetric. Posterior circulation: Vertebral arteries widely patent to the vertebrobasilar junction without stenosis. Patent right PICA. Left PICA not visualized. Dominant left AICA. Basilar widely patent to its distal aspect. Superior cerebral arteries patent bilaterally. Both of the posterior cerebral arteries primarily supplied via the basilar. Mild atheromatous irregularity within the PCAs bilaterally without high-grade stenosis. PCAs are patent to their distal aspects. Small right posterior communicating artery noted. Venous sinuses: Grossly patent, although not well assessed due to arterial timing the contrast bolus. Anatomic variants: None significant. Delayed phase: No abnormal enhancement. Review of the MIP images confirms the above findings IMPRESSION: 1. Negative CTA for emergent large vessel occlusion. 2. Postoperative changes from interval left carotid endarterectomy without adverse features. No significant residual or recurrent stenosis identified. 3. Atheromatous stenosis of approximately 50% at the right carotid bifurcation, stable. 4. Moderate atherosclerotic change throughout the carotid siphons without high-grade stenosis, similar to previous. No other high-grade or correctable stenosis identified. Electronically Signed   By: Jeannine Boga M.D.   On:  12/31/2017 00:33   Mr Brain Wo Contrast (neuro Protocol)  Result Date: 12/30/2017 CLINICAL DATA:  Gait difficulty.  Headache. Intermittent confusion for 3 days. EXAM: MRI HEAD WITHOUT CONTRAST TECHNIQUE: Multiplanar, multiecho pulse sequences of the brain and surrounding structures were obtained without intravenous contrast. COMPARISON:  Head CT 11/29/2017 and MRI 06/23/2017 FINDINGS: Brain: A 1.3 cm region of restricted diffusion involving the posterior right corona radiata is consistent with an acute lateral lenticulostriate territory infarct. Scattered small areas of mild T2 shine through are noted on diffusion imaging elsewhere in both cerebral hemispheres. One or two foci of chronic microhemorrhage are noted in the right frontal and right parietal lobes. Patchy chronic T2 hyperintensities in the cerebral white matter and pons are similar to the prior MRI and are nonspecific but compatible with moderate chronic small vessel ischemic disease, advanced for age. Chronic lacunar infarcts are again seen in the cerebral white matter bilaterally, right thalamus, and pons. There is moderate cerebral atrophy. There is no midline shift or extra-axial fluid collection. Vascular: Major intracranial vascular flow voids are preserved. Skull and upper cervical spine: Unremarkable bone marrow signal. Sinuses/Orbits: Unremarkable orbits. Clear paranasal sinuses. Trace bilateral mastoid effusions. Other: None. IMPRESSION: 1. Acute right corona radiata infarct. 2. Moderate chronic small vessel ischemic disease. Electronically Signed   By: Logan Bores M.D.   On: 12/30/2017 18:45    Microbiology: No results found for this or any previous visit (from the past 240 hour(s)).   Labs: Basic Metabolic Panel: Recent Labs  Lab 12/30/17 1344 12/30/17 1405 12/31/17 0233  NA 140 140  --   K 3.4* 3.5  --   CL 104 103  --   CO2 28  --   --   GLUCOSE 269* 274*  --   BUN 8 9  --   CREATININE 0.71 0.60  --   CALCIUM 8.9  --    --   MG  --   --  2.0   Liver Function Tests: Recent Labs  Lab 12/30/17 1344  AST 19  ALT 17  ALKPHOS 82  BILITOT 0.8  PROT 5.9*  ALBUMIN 3.4*   No results for input(s): LIPASE, AMYLASE in the last 168 hours. No results for input(s): AMMONIA in the last 168 hours. CBC: Recent Labs  Lab 12/30/17 1344 12/30/17 1405  WBC 6.6  --   NEUTROABS 4.3  --   HGB 16.0* 15.6*  HCT 47.4* 46.0  MCV 91.7  --   PLT 174  --    Cardiac Enzymes: No results for input(s): CKTOTAL, CKMB, CKMBINDEX, TROPONINI in the last 168 hours. BNP: BNP (last 3 results) No results for input(s): BNP in the last 8760 hours.  ProBNP (last 3 results) No results for input(s): PROBNP in the last 8760 hours.  CBG: Recent Labs  Lab 12/31/17 1345 12/31/17 1737 12/31/17 2153 01/01/18 0012 01/01/18 0627  GLUCAP 256* 179* 276* 261* 174*       Signed:  Gilliam Hawkes  [facility] 01/01/2018, 10:26 AM

## 2018-01-01 NOTE — Care Management Note (Signed)
Case Management Note  Patient Details  Name: Melanie Lucas MRN: [id number] Date of Birth: 02-Oct-1950  Subjective/Objective:    Pt admitted with a stroke. She is from home with her spouse. Currently he is able to provide supervision and assistance at home but will be having a knee replacement in upcoming future.  DME: cane and walker Pt denies issues with obtaining her medications.  No issues with transportation.                Action/Plan: Recommendations are for CIR but pt refusing. Other recommendation is for outpatient therapy. Pt is in agreement. She would like to attend [employer]. Orders in Epic and information on the AVS.  Pt with orders for 3 in 1. James with [facility] DME notified and will deliver to the room. Pt's spouse to provide transport home. MD had placed orders for [facility] service. Pt prefers outpatient therapy. MD aware and in agreement.   Expected Discharge Date:  01/01/18               Expected Discharge Plan:  OP Rehab  In-House Referral:     Discharge planning Services  CM Consult  Post Acute Care Choice:    Choice offered to:     DME Arranged:  3-N-1 DME Agency:  [place]:    [facility] Agency:     Status of Service:  Completed, signed off  If discussed at [place] of Stay Meetings, dates discussed:    Additional Comments:  Pollie Friar, RN 01/01/2018, 11:39 AM

## 2018-01-01 NOTE — Evaluation (Signed)
Speech Language Pathology Evaluation Patient Details Name: Melanie Lucas MRN: [id number] DOB: November 07, 1950 Today's Date: 01/01/2018 Time: 4270-6237 SLP Time Calculation (min) (ACUTE ONLY): 23 min  Problem List:  Patient Active Problem List   Diagnosis Date Noted  . Acute metabolic encephalopathy 62/83/1517  . Depression 12/31/2017  . Stroke ([place]) 12/31/2017  . COPD (chronic obstructive pulmonary disease) ([place]) 12/30/2017  . Left carotid artery stenosis 07/29/2017  . TIA (transient ischemic attack) 06/24/2017  . Ischemic stroke ([place]) 06/23/2017  . Cervical spondylosis with myelopathy 02/21/2016  . Bilateral leg edema 11/24/2014  . Cervical stenosis of spine 11/24/2014  . Numbness of fingers of both hands 11/24/2014  . HLD (hyperlipidemia) 11/09/2014  . Diabetes mellitus without complication ([place]) 61/60/7371  . Pulmonary hypertension ([place]) 08/07/2013  . CAD (coronary artery disease) 03/11/2013  . Peripheral edema 02/25/2012  . Essential hypertension, benign 05/06/2009  . Overweight 04/13/2009  . TOBACCO ABUSE 04/13/2009  . A-fib ([place]) 04/13/2009  . CHEST PAIN, ATYPICAL 04/13/2009   Past Medical History:  Past Medical History:  Diagnosis Date  . Anxiety   . Cancer (HCC)    Skin- leg   . Chronic acquired lymphedema    a. R>L;  b. 03/2012 Neg LE U/S for DVT. Legs since age 46  . Complication of anesthesia    woke up during colonoscopy, Lithrostrixpy, Biospy  . COPD (chronic obstructive pulmonary disease) ([place])   . Coronary artery disease   . CVD (cardiovascular disease)   . Deaf, right   . Diabetes mellitus    Type II  . Diabetic neuropathy ([place])   . Dysrhythmia    afib  . Elevated TSH    a. 10/2012 - inst to f/u PCP.  . Full dentures   . Hematuria    a. while on pradaxa,  she reports that she has seen Dr Margie Ege and had low risk cystoscopy  . History of kidney stones   . Hyperlipidemia   . Hypertension    patient denies  . Incontinence    prior to urinating  .  Lacunar infarction ([place])    a. 02/2009 non-acute Lacunar infarct of the right thalamus noted on MRI of brain.  . Left carotid stenosis   . Lymphedema of extremity    had this problem, since she was a teenager. especially seen in R LE  . Meniere disease   . Myocardial infarction ([place])   . Obesity   . Osteoarthritis    cervical & lumbar region, knees, hands cramp also   . PAF with post-termination pauses    a. on dronedarone;  b. CHA2DS2VASc = 5 (HTN, DM, h/o lacunar infarct on MRI, Female) ->refused oral anticoagulation after h/o hematuria on pradaxa;  c. 02/2012 Echo: EF 55-60%, mildly dil LA. D. Recurrent PAF 10/2012 after only taking Multaq 1x/day - spont conv to NSR, placed back on BID Multaq/eliquis;  e. 10/2014 Multaq d/c'd->tikosyn initiated.  . Shortness of breath dyspnea    with exertion  . Stroke [facility])    2 mini strokes  . Tobacco abuse    Past Surgical History:  Past Surgical History:  Procedure Laterality Date  . ANTERIOR CERVICAL CORPECTOMY N/A 02/21/2016   Procedure: ANTERIOR CERVICAL CORPECTOMY ANS FUSION CERVICAL SIX , ANTERIOR PLATING CERVICAL FIVE-SEVEN;  Surgeon: Consuella Lose, MD;  Location: [facility];  Service: Neurosurgery;  Laterality: N/A;  . ANTERIOR FUSION CERVICAL SPINE  02/21/2016  . Biopsy of adrenal glands    . BREAST BIOPSY Bilateral    benign results,  both breasts   . CARDIAC CATHETERIZATION  2014   x1 stent placed, done in [place]   . CARDIOVERSION N/A 11/11/2014   Procedure: CARDIOVERSION;  Surgeon: Larey Dresser, MD;  Location: [place];  Service: Cardiovascular;  Laterality: N/A;  . CAROTID ENDARTERECTOMY Left 07/29/2017  . COLONOSCOPY W/ POLYPECTOMY    . CORONARY STENT PLACEMENT    . ENDARTERECTOMY Left 07/29/2017   Procedure: ENDARTERECTOMY CAROTID LEFT;  Surgeon: Rosetta Posner, MD;  Location: [place];  Service: Vascular;  Laterality: Left;  . PATCH ANGIOPLASTY Left 07/29/2017   Procedure: PATCH ANGIOPLASTY USING HEMASHIELD GOLD VASCULAR PATCH;   Surgeon: Rosetta Posner, MD;  Location: [place];  Service: Vascular;  Laterality: Left;  . TEE WITHOUT CARDIOVERSION N/A 11/11/2014   Procedure: TRANSESOPHAGEAL ECHOCARDIOGRAM (TEE);  Surgeon: Larey Dresser, MD;  Location: [facility] ENDOSCOPY;  Service: Cardiovascular;  Laterality: N/A;   HPI:  Melanie Lucas is a 67 y.o. female with a history of tobacco use, CAD, DM, HTN, hyperlipidemia, obesity, meniere disease, COPD, TIA X2, prior lacunar infarct (2011) on Eliquis who presents emergency department today for headache, unstedy gait/L foot drag, and dysarthria. MRI showed acute right corona radiata infarct and moderate chronic small vessel ischemic disease.   Assessment / Plan / Recommendation Clinical Impression  Pt was attentive and pleasant during ST evaluation today; she completed the [facility] Cognitive Assessment [facility]) with a score of 26/30, indicating cognitive functioning WFL. Although no family at bedside, pt reported complete independence with ADLs prior to this admission. She spoke to prior deficits from previous strokes (decreased processing time/comprehension/dysarthria/weakness), however reports they have since then resolved, and she does not perceive decline in cognition or speech since this hospital admission. She also reports her husband is available for 24hr assistance as needed upon d/c. Pt's expressive/receptive language and motor speech appears WFL at the conversation level. She also exhibited appropriate initiation, simple problem solving, attention, and visuospatial skills during tasks assessed. Mild short-term memory deficit was evidenced in decreased recall of newly learned words; she recalled 2/5 words independently and required min-mod verbal cues to recall additional 3 words. Although pt's functioning believed to be close to/at baseline, SLP reviewed external memory aid strategies to facilitate optimal functioning upon d/c. No further ST is recommended at this time.     SLP Assessment   SLP Recommendation/Assessment: Patient does not need any further Speech Lanaguage Pathology Services SLP Visit Diagnosis: Cognitive communication deficit (R41.841)    Follow Up Recommendations  None    Frequency and Duration           SLP Evaluation Cognition  Overall Cognitive Status: Within Functional Limits for tasks assessed Arousal/Alertness: Awake/alert Orientation Level: Oriented X4 Attention: Focused;Sustained Focused Attention: Appears intact Sustained Attention: Appears intact Memory: Impaired Memory Impairment: Decreased short term memory;Storage deficit Decreased Short Term Memory: Verbal basic Awareness: Appears intact Problem Solving: Appears intact Executive Function: Initiating;Self Correcting Initiating: Appears intact Self Correcting: Appears intact Safety/Judgment: Appears intact       Comprehension  Auditory Comprehension Overall Auditory Comprehension: Appears within functional limits for tasks assessed Yes/No Questions: Not tested Commands: Not tested Conversation: Simple Visual Recognition/Discrimination Discrimination: Not tested Reading Comprehension Reading Status: Not tested    Expression Expression Primary Mode of Expression: Verbal Verbal Expression Overall Verbal Expression: Appears within functional limits for tasks assessed Initiation: No impairment Level of Generative/Spontaneous Verbalization: Conversation Repetition: (NT) Naming: No impairment Pragmatics: No impairment Written Expression Dominant Hand: Right Written Expression: Not tested   Oral /  Motor  Oral Motor/Sensory Function Overall Oral Motor/Sensory Function: Within functional limits Motor Speech Overall Motor Speech: Appears within functional limits for tasks assessed Respiration: Within functional limits Phonation: Normal Resonance: Within functional limits Articulation: Within functional limitis Intelligibility: Intelligible Motor Planning: Witnin functional  limits Motor Speech Errors: Not applicable   Jettie Booze, Student SLP                     Jettie Booze 01/01/2018, 12:02 PM

## 2018-01-01 NOTE — Evaluation (Signed)
Occupational Therapy Evaluation Patient Details Name: Melanie Lucas MRN: [id number] DOB: November 21, 1950 Today's Date: 01/01/2018    History of Present Illness Pt is a 67 y/o female admitted secondary to unsteady gait and dysarthria. Imaging revealed R corona radiata infarct. PMH includes HTN, a fib, CAD, COPD, DM, and tobacco abuse.    Clinical Impression   PTA patient independent.  Currently admitted for above and limited by L sided weakness and impaired coordination, decreased activity tolerance, impaired balance, and decreased safety awareness.  Patient currently requires min guard for grooming at sink standing, setup/supervision for UB ADLs, supervision for LB ADLs, min guard for transfers and mobility using RW.  Pt educated on safety with sitting on 3:1 for bathing with supervision, using RW for mobility and having supervision; pt voiced understanding but then found completing mobility in room without RW or assistance. Patient will benefit from continued OT services while admitted and after dc at OP OT level.  Will continue to follow.     Follow Up Recommendations  Outpatient OT    Equipment Recommendations  3 in 1 bedside commode    Recommendations for Other Services       Precautions / Restrictions Precautions Precautions: Fall Restrictions Weight Bearing Restrictions: No      Mobility Bed Mobility Overal bed mobility: Needs Assistance Bed Mobility: Supine to Sit     Supine to sit: Supervision     General bed mobility comments: Supervision for safety.   Transfers Overall transfer level: Needs assistance Equipment used: Rolling walker (2 wheeled) Transfers: Sit to/from Stand Sit to Stand: Min guard         General transfer comment: min guard for safety and balance    Balance Overall balance assessment: Needs assistance Sitting-balance support: No upper extremity supported;Feet supported Sitting balance-Leahy Scale: Good     Standing balance support: During  functional activity;No upper extremity supported Standing balance-Leahy Scale: Poor Standing balance comment: able to complete grooming standing with 0 hand support with min guard, but preference to UE support (furniture walking when not using RW)                           ADL either performed or assessed with clinical judgement   ADL Overall ADL's : Needs assistance/impaired     Grooming: Min guard;Standing;Wash/dry hands   Upper Body Bathing: Supervision/ safety;Sitting   Lower Body Bathing: Supervison/ safety;Sit to/from stand   Upper Body Dressing : Supervision/safety;Sitting   Lower Body Dressing: Supervision/safety;Sit to/from stand Lower Body Dressing Details (indicate cue type and reason): able to don socks seated EOB without assist Toilet Transfer: Min guard;Ambulation;RW Toilet Transfer Details (indicate cue type and reason): min guard for safety and balance, simulated in room Toileting- Clothing Manipulation and Hygiene: Supervision/safety;Sit to/from stand   Tub/ Shower Transfer: Min guard;Ambulation;3 in 1 Tub/Shower Transfer Details (indicate cue type and reason): simulated for safety, discussed safety with hand held support when stepping over ledge, using of 3:1 over commode; educated not to sit in tub  Functional mobility during ADLs: Min guard;Rolling walker General ADL Comments: min guard for safety and balance, cueing for safety awareness      Vision Baseline Vision/History: No visual deficits Patient Visual Report: No change from baseline Vision Assessment?: Yes Eye Alignment: Within Functional Limits Ocular Range of Motion: Within Functional Limits Alignment/Gaze Preference: Within Defined Limits Tracking/Visual Pursuits: Able to track stimulus in all quads without difficulty Saccades: Additional head turns occurred during testing Convergence:  Within functional limits Visual Fields: No apparent deficits     Perception     Praxis       Pertinent Vitals/Pain Pain Assessment: Faces Faces Pain Scale: No hurt     Hand Dominance Right   Extremity/Trunk Assessment Upper Extremity Assessment Upper Extremity Assessment: LUE deficits/detail LUE Deficits / Details: grossly 4-/5 MMT, dysmetric  LUE Sensation: WNL LUE Coordination: decreased fine motor;decreased gross motor   Lower Extremity Assessment Lower Extremity Assessment: Defer to PT evaluation   Cervical / Trunk Assessment Cervical / Trunk Assessment: Normal   Communication Communication Communication: No difficulties   Cognition Arousal/Alertness: Awake/alert Behavior During Therapy: WFL for tasks assessed/performed Overall Cognitive Status: No family/caregiver present to determine baseline cognitive functioning Area of Impairment: Memory;Safety/judgement                     Memory: Decreased recall of precautions;Decreased short-term memory   Safety/Judgement: Decreased awareness of safety         General Comments       Exercises     Shoulder Instructions      Home Living Family/patient expects to be discharged to:: Private residence Living Arrangements: Spouse/significant other;Non-relatives/Friends Available Help at Discharge: Family;Available 24 hours/day Type of Home: House Home Access: Stairs to enter [employer] of Steps: 3 Entrance Stairs-Rails: Right;Left Home Layout: Two level;Able to live on main level with bedroom/bathroom     Bathroom Shower/Tub: Teacher, early years/pre: Standard     Home Equipment: Environmental consultant - 2 wheels;Kasandra Knudsen - single point      Lives With: Spouse    Prior Functioning/Environment Level of Independence: Independent                 OT Problem List: Decreased strength;Decreased activity tolerance;Impaired balance (sitting and/or standing);Decreased coordination;Decreased safety awareness;Decreased knowledge of use of DME or AE;Impaired UE functional use;Decreased knowledge  of precautions      OT Treatment/Interventions: Self-care/ADL training;Therapeutic exercise;Neuromuscular education;Energy conservation;DME and/or AE instruction;Balance training;Therapeutic activities;Patient/family education    OT Goals(Current goals can be found in the care plan section) Acute Rehab OT Goals Patient Stated Goal: to go home and go to the beach this weekend OT Goal Formulation: With patient Time For Goal Achievement: 01/15/18 Potential to Achieve Goals: Good  OT Frequency: Min 2X/week   Barriers to D/C:            Co-evaluation              AM-PAC PT "6 Clicks" Daily Activity     Outcome Measure Help from another person eating meals?: None Help from another person taking care of personal grooming?: A Little Help from another person toileting, which includes using toliet, bedpan, or urinal?: None Help from another person bathing (including washing, rinsing, drying)?: A Little Help from another person to put on and taking off regular upper body clothing?: None Help from another person to put on and taking off regular lower body clothing?: None 6 Click Score: 22   End of Session Equipment Utilized During Treatment: Rolling walker Nurse Communication: Mobility status  Activity Tolerance: Patient tolerated treatment well Patient left: with call bell/phone within reach;Other (comment)(seated EOB, SLP in room)  OT Visit Diagnosis: Other abnormalities of gait and mobility (R26.89);Muscle weakness (generalized) (M62.81);Other symptoms and signs involving the nervous system (R29.898)                Time: 7591-6384 OT Time Calculation (min): 18 min Charges:  OT General Charges $OT Visit:  1 Visit OT Evaluation $OT Eval Moderate Complexity: [place], [place] Acute Rehabilitation Services Pager [phone] Office [phone]   Delight Stare 01/01/2018, 1:31 PM

## 2018-01-04 ENCOUNTER — Other Ambulatory Visit: Payer: Self-pay | Admitting: Internal Medicine

## 2018-01-06 ENCOUNTER — Other Ambulatory Visit: Payer: Self-pay | Admitting: *Deleted

## 2018-01-06 NOTE — Patient Outreach (Addendum)
[facility]) Care Management  01/06/2018  Melanie Lucas [birth year] [id number]   EMMI- stroke  RED ON EMMI ALERT Day # 1 Date: 01/03/18 1000 Red Alert Reason: problems setting up rehab? Yes    Insurance:  United health care medicare  [facility] admissions x 2 ED visits x 1 in the last 6 months    Outreach attempt # 1 successful  Patient is able to verify HIPAA [facility] Care Management RN reviewed and addressed red alert with patient [name] confirms she answered the EMMI question correct. [name] report she is presently at the beach "trying to recuperate" She reports she has not set up outpatient rehab at this time because she is not sure of when the start date will be. She is reporting that she and her husband are to discuss it because her husband is scheduled to have surgery also. She reports she thinks she will postpone neuro rehab a week or more [facility] RN CM spoke with Ubaldo Glassing of Neuro rehab prior to calling the patient who stated she will attempt to call the patient to schedule her prn.  [facility] RN CM encouraged [name] to update neuro rehab staff when they contact her on her decision to postpone therapy.    Transition of care services noted to be completed by primary care MD office staff [facility] [employer]  Consent: [facility] RN CM reviewed [facility] services with patient. Patient gave verbal consent for services. She denies need of services at this time.   CM offered to assist prn with referrals for transportation services or any other supportive service prn She voiced appreciation and understanding  [facility] RN Cm encouraged her to call Cm back if assistance is needed Discussed with her that option of home health available for her if she wants to stay home to care for her husband after his knee replacement  Advised patient that there will be further automated EMMI- post discharge calls to assess how the patient is doing following the recent hospitalization Advised the  patient that another call may be received from a nurse if any of their responses were abnormal. Patient voiced understanding and was appreciative of f/u call.   Plan: [facility] RN CM will close case at this time as patient has been assessed and no needs identified.   [facility] RN CM sent a successful outreach letter as discussed with [facility] brochure enclosed for review  Pt encouraged to return a call to [facility] RN CM prn    L. Lavina Hamman, RN, BSN, [place] Coordinator Office number [phone] Mobile number [phone]  Main [facility] number [phone] Fax number [phone]

## 2018-01-09 ENCOUNTER — Other Ambulatory Visit: Payer: Self-pay | Admitting: *Deleted

## 2018-01-09 NOTE — Patient Outreach (Addendum)
[facility]) Care Management  01/09/2018  Melanie Lucas May 13, 1950 [id number]   EMMI-stroke  RED ON EMMI ALERT Day # 6 Date: 01/08/18 1002 Red Alert Reason: New or worsening pain/fever/shortness of breath? yes Smoked or been around smoke? yes  Insurance: united health care medicare  [facility] admissions x 2 ED visits x 1 in the last 6 months   Outreach attempt # 1 successful to her home/mobile number  Patient is able to verify HIPAA [place] Management RN reviewed and addressed red alert with patient [name] reports she is not having new or worsening pain/fevers/sob The answer was documented by EMMI incorrectly The answer should have been "No"  She does confirm the answer to Smoked or been around smoke is correct as yes She reports she is a "smoker" and continues to smoke she reports she attempted to stop again while hospitalized recently with "patches" but began smoking again after discharge. She reports she was not given a rx for patches at discharge She reports she can not take chantix "cause nightmares" "I can probably do nicorette" She reports she "can not take Wellbutrin", Excuse my french but I became a bitch" She is to see her primary MD, Via on Monday January 13 2018 CM encouraged her to speak with MD about smoke cessation methods and Rx prn  She also informs CM she will make other MD appointments with Dr Rayann Heman now since today her husband, Edd Arbour has had his knee surgery She and her husband decided they would each like to have home health services  Her husband will be going home with a CPM machine and their daughter is present to help care for both of them    Consent: [facility] RN CM reviewed [facility] services with patient. Patient gave verbal consent for services.   Advised patient that there will be further automated EMMI- post discharge calls to assess how the patient is doing following the recent hospitalization Advised the patient that another call may be  received from a nurse if any of their responses were abnormal. Patient voiced understanding and was appreciative of f/u call.   Plan: [facility] RN CM will close case at this time as patient has been assessed and no needs identified.   Pt encouraged to return a call to [facility] RN CM prn to assist with home health and other services as needed  Routed note to MDs listed in [place]. Lavina Hamman, RN, BSN, [place] Coordinator Office number [phone] Mobile number [phone]  Main [facility] number [phone] Fax number [phone]

## 2018-01-10 ENCOUNTER — Other Ambulatory Visit: Payer: Self-pay

## 2018-01-10 ENCOUNTER — Emergency Department (HOSPITAL_COMMUNITY): Payer: Medicare Other

## 2018-01-10 ENCOUNTER — Emergency Department (HOSPITAL_COMMUNITY)
Admission: EM | Admit: 2018-01-10 | Discharge: 2018-01-11 | Disposition: A | Discharge status: Home / Self Care | Payer: Medicare Other | Attending: Emergency Medicine | Admitting: Emergency Medicine

## 2018-01-10 ENCOUNTER — Encounter (HOSPITAL_COMMUNITY): Payer: Self-pay

## 2018-01-10 DIAGNOSIS — J449 Chronic obstructive pulmonary disease, unspecified: Secondary | ICD-10-CM | POA: Diagnosis not present

## 2018-01-10 DIAGNOSIS — Z85828 Personal history of other malignant neoplasm of skin: Secondary | ICD-10-CM | POA: Diagnosis not present

## 2018-01-10 DIAGNOSIS — F17210 Nicotine dependence, cigarettes, uncomplicated: Secondary | ICD-10-CM | POA: Diagnosis not present

## 2018-01-10 DIAGNOSIS — I2510 Atherosclerotic heart disease of native coronary artery without angina pectoris: Secondary | ICD-10-CM | POA: Diagnosis not present

## 2018-01-10 DIAGNOSIS — I4891 Unspecified atrial fibrillation: Secondary | ICD-10-CM | POA: Insufficient documentation

## 2018-01-10 DIAGNOSIS — I252 Old myocardial infarction: Secondary | ICD-10-CM | POA: Insufficient documentation

## 2018-01-10 DIAGNOSIS — Z7901 Long term (current) use of anticoagulants: Secondary | ICD-10-CM | POA: Insufficient documentation

## 2018-01-10 DIAGNOSIS — Z79899 Other long term (current) drug therapy: Secondary | ICD-10-CM | POA: Insufficient documentation

## 2018-01-10 DIAGNOSIS — I10 Essential (primary) hypertension: Secondary | ICD-10-CM | POA: Insufficient documentation

## 2018-01-10 DIAGNOSIS — M542 Cervicalgia: Secondary | ICD-10-CM | POA: Diagnosis present

## 2018-01-10 DIAGNOSIS — E1165 Type 2 diabetes mellitus with hyperglycemia: Secondary | ICD-10-CM | POA: Insufficient documentation

## 2018-01-10 DIAGNOSIS — I509 Heart failure, unspecified: Secondary | ICD-10-CM | POA: Diagnosis not present

## 2018-01-10 DIAGNOSIS — R739 Hyperglycemia, unspecified: Secondary | ICD-10-CM

## 2018-01-10 LAB — URINALYSIS, ROUTINE W REFLEX MICROSCOPIC
Bacteria, UA: NONE SEEN
Bilirubin Urine: NEGATIVE
Hgb urine dipstick: NEGATIVE
Ketones, ur: 5 mg/dL — AB
NITRITE: NEGATIVE
Protein, ur: NEGATIVE mg/dL
SPECIFIC GRAVITY, URINE: 1.029 (ref 1.005–1.030)
pH: 7 (ref 5.0–8.0)

## 2018-01-10 LAB — CBG MONITORING, ED: GLUCOSE-CAPILLARY: 358 mg/dL — AB (ref 70–99)

## 2018-01-10 LAB — BASIC METABOLIC PANEL
ANION GAP: 10 (ref 5–15)
BUN: 15 mg/dL (ref 8–23)
CALCIUM: 9.1 mg/dL (ref 8.9–10.3)
CO2: 24 mmol/L (ref 22–32)
Chloride: 104 mmol/L (ref 98–111)
Creatinine, Ser: 0.71 mg/dL (ref 0.44–1.00)
GFR calc Af Amer: >60 mL/min (ref >60)
Glucose, Bld: 339 mg/dL — ABNORMAL HIGH (ref 70–99)
POTASSIUM: 4.3 mmol/L (ref 3.5–5.1)
Sodium: 138 mmol/L (ref 135–145)

## 2018-01-10 LAB — CBC
HCT: 51.1 % — ABNORMAL HIGH (ref 36.0–46.0)
HEMOGLOBIN: 16.9 g/dL — AB (ref 12.0–15.0)
MCH: 30.7 pg (ref 26.0–34.0)
MCHC: 33.1 g/dL (ref 30.0–36.0)
MCV: 92.7 fL (ref 80.0–100.0)
PLATELETS: 207 10*3/uL (ref 150–400)
RBC: 5.51 MIL/uL — AB (ref 3.87–5.11)
RDW: 12.7 % (ref 11.5–15.5)
WBC: 7.1 10*3/uL (ref 4.0–10.5)
nRBC: 0 % (ref 0.0–0.2)

## 2018-01-10 LAB — I-STAT TROPONIN, ED: Troponin i, poc: 0.02 ng/mL (ref 0.00–0.08)

## 2018-01-10 MED ORDER — ETOMIDATE 2 MG/ML IV SOLN: 15.0000 mg | Freq: Once | INTRAVENOUS | Status: DC

## 2018-01-10 MED ORDER — ETOMIDATE 2 MG/ML IV SOLN
12.0000 mg | Freq: Once | INTRAVENOUS | Status: AC
  Administered 2018-01-10: 12 mg via INTRAVENOUS
  Filled 2018-01-10: qty 10

## 2018-01-10 MED ORDER — DILTIAZEM HCL 25 MG/5ML IV SOLN
20.0000 mg | Freq: Once | INTRAVENOUS | Status: AC
  Administered 2018-01-10: 20 mg via INTRAVENOUS
  Filled 2018-01-10: qty 5

## 2018-01-10 MED ORDER — SODIUM CHLORIDE 0.9 % IV BOLUS
1000.0000 mL | Freq: Once | INTRAVENOUS | Status: AC
  Administered 2018-01-10: 1000 mL via INTRAVENOUS

## 2018-01-10 NOTE — ED Provider Notes (Signed)
[facility] DEPT Provider Note   CSN: [id number] Arrival date & time: 01/10/18  2117     History   Chief Complaint Chief Complaint  Patient presents with  . Hyperglycemia  . Arm Pain    HPI Melanie Lucas is a 67 y.o. female with a hx of tobacco abuse, COPD, CAD, K4YJ, diastolic CHF, HTN, hyperlipidemia, PAF w/ post-termination pauses on Eliquis, and recent admission for ischemic stroke on MRI 11/11-11/13 who presents to the ED with multiple complaints. S/p discharge from the hospital patient has been overall doing well, she has been ambulating with a cane due to some residual balance issues. She states that she is having bilateral neck pain and bilateral upper arm pain x 5-6 days which she attributes to walking with the cane (has been switching between UEs), no associated neuro complaints. She states pain is achy, mild, worse with movement, improved with ibuprofen. She states that tonight she drank a cold slurppy/smoothie and developed headache which has since resolved. She returned home and called her family member who works as a Audiological scientist as she generally did not feel well, he checked her blood sugar and it was 320. She became mildly dyspneic with some very mild  anterior bilateral chest discomfort described as pressure and the decision was ultimately made to come to the ED for evaluation. Patient states pain in all locations is currently mild, 2-3/10 in severity. She denies change in vision, syncope, palpitations, nausea, vomiting, diaphoresis, fever, or chills.   Per chart review: Patient has hx of PAF, on eliquis, sees cardiologist Dr. Rayann Heman, appears she underwent cardioversion 10/2014. Does not believe she is frequently in afib or in afib at baseline.   HPI  Past Medical History:  Diagnosis Date  . Anxiety   . Cancer (HCC)    Skin- leg   . Chronic acquired lymphedema    a. R>L;  b. 03/2012 Neg LE U/S for DVT. Legs since age 76  . Complication of  anesthesia    woke up during colonoscopy, Lithrostrixpy, Biospy  . COPD (chronic obstructive pulmonary disease) ([place])   . Coronary artery disease   . CVD (cardiovascular disease)   . Deaf, right   . Diabetes mellitus    Type II  . Diabetic neuropathy ([place])   . Dysrhythmia    afib  . Elevated TSH    a. 10/2012 - inst to f/u PCP.  . Full dentures   . Hematuria    a. while on pradaxa,  she reports that she has seen Dr Margie Ege and had low risk cystoscopy  . History of kidney stones   . Hyperlipidemia   . Hypertension    patient denies  . Incontinence    prior to urinating  . Lacunar infarction ([place])    a. 02/2009 non-acute Lacunar infarct of the right thalamus noted on MRI of brain.  . Left carotid stenosis   . Lymphedema of extremity    had this problem, since she was a teenager. especially seen in R LE  . Meniere disease   . Myocardial infarction ([place])   . Obesity   . Osteoarthritis    cervical & lumbar region, knees, hands cramp also   . PAF with post-termination pauses    a. on dronedarone;  b. CHA2DS2VASc = 5 (HTN, DM, h/o lacunar infarct on MRI, Female) ->refused oral anticoagulation after h/o hematuria on pradaxa;  c. 02/2012 Echo: EF 55-60%, mildly dil LA. D. Recurrent PAF 10/2012 after only taking Multaq  1x/day - spont conv to NSR, placed back on BID Multaq/eliquis;  e. 10/2014 Multaq d/c'd->tikosyn initiated.  . Shortness of breath dyspnea    with exertion  . Stroke [facility])    2 mini strokes  . Tobacco abuse     Patient Active Problem List   Diagnosis Date Noted  . Acute metabolic encephalopathy 57/84/6962  . Depression 12/31/2017  . Stroke ([place]) 12/31/2017  . COPD (chronic obstructive pulmonary disease) ([place]) 12/30/2017  . Left carotid artery stenosis 07/29/2017  . TIA (transient ischemic attack) 06/24/2017  . Ischemic stroke ([place]) 06/23/2017  . Cervical spondylosis with myelopathy 02/21/2016  . Bilateral leg edema 11/24/2014  . Cervical stenosis of spine 11/24/2014    . Numbness of fingers of both hands 11/24/2014  . HLD (hyperlipidemia) 11/09/2014  . Diabetes mellitus without complication ([place]) 95/28/4132  . Pulmonary hypertension ([place]) 08/07/2013  . CAD (coronary artery disease) 03/11/2013  . Peripheral edema 02/25/2012  . Essential hypertension, benign 05/06/2009  . Overweight 04/13/2009  . TOBACCO ABUSE 04/13/2009  . A-fib ([place]) 04/13/2009  . CHEST PAIN, ATYPICAL 04/13/2009    Past Surgical History:  Procedure Laterality Date  . ANTERIOR CERVICAL CORPECTOMY N/A 02/21/2016   Procedure: ANTERIOR CERVICAL CORPECTOMY ANS FUSION CERVICAL SIX , ANTERIOR PLATING CERVICAL FIVE-SEVEN;  Surgeon: Consuella Lose, MD;  Location: [facility];  Service: Neurosurgery;  Laterality: N/A;  . ANTERIOR FUSION CERVICAL SPINE  02/21/2016  . Biopsy of adrenal glands    . BREAST BIOPSY Bilateral    benign results, both breasts   . CARDIAC CATHETERIZATION  2014   x1 stent placed, done in [place]   . CARDIOVERSION N/A 11/11/2014   Procedure: CARDIOVERSION;  Surgeon: Larey Dresser, MD;  Location: [facility];  Service: Cardiovascular;  Laterality: N/A;  . CAROTID ENDARTERECTOMY Left 07/29/2017  . COLONOSCOPY W/ POLYPECTOMY    . CORONARY STENT PLACEMENT    . ENDARTERECTOMY Left 07/29/2017   Procedure: ENDARTERECTOMY CAROTID LEFT;  Surgeon: Rosetta Posner, MD;  Location: [facility];  Service: Vascular;  Laterality: Left;  . PATCH ANGIOPLASTY Left 07/29/2017   Procedure: PATCH ANGIOPLASTY USING HEMASHIELD GOLD VASCULAR PATCH;  Surgeon: Rosetta Posner, MD;  Location: [facility];  Service: Vascular;  Laterality: Left;  . TEE WITHOUT CARDIOVERSION N/A 11/11/2014   Procedure: TRANSESOPHAGEAL ECHOCARDIOGRAM (TEE);  Surgeon: Larey Dresser, MD;  Location: [facility];  Service: Cardiovascular;  Laterality: N/A;     OB History   None      Home Medications    Prior to Admission medications   Medication Sig Start Date End Date Taking? Authorizing Provider  albuterol (PROVENTIL  HFA;VENTOLIN HFA) 108 (90 Base) MCG/ACT inhaler Inhale 2 puffs into the lungs every 6 (six) hours as needed for wheezing or shortness of breath.     [provider]  aspirin EC 81 MG tablet Take 1 tablet (81 mg total) by mouth daily. 06/24/17   Caren Griffins, MD  dofetilide (TIKOSYN) 500 MCG capsule TAKE 1 CAPSULE (500 MCG TOTAL) 2 (TWO) TIMES DAILY BY MOUTH. 01/07/18   Allred, Jeneen Rinks, MD  DULOXETINE HCL PO Take 40 mg by mouth daily.     [provider]  ELIQUIS 5 MG TABS tablet TAKE 1 TABLET (5 MG TOTAL) BY MOUTH 2 (TWO) TIMES DAILY. 03/14/17   Allred, Jeneen Rinks, MD  Empagliflozin-metFORMIN HCl (SYNJARDY) 12.06-998 MG TABS Take 1 tablet by mouth 2 (two) times daily.    [provider]  furosemide (LASIX) 20 MG tablet Take 20 mg  by mouth daily.     [provider]  ibuprofen (ADVIL,MOTRIN) 800 MG tablet Take 800 mg by mouth 3 (three) times daily as needed for mild pain.     [provider]  Multiple Vitamin (MULTIVITAMIN) tablet Take 1 tablet by mouth at bedtime.     [provider]  nicotine (NICODERM CQ - DOSED IN MG/24 HOURS) 21 mg/24hr patch Place 1 patch (21 mg total) onto the skin daily. Patient not taking: Reported on 12/30/2017 06/25/17   Caren Griffins, MD  oxyCODONE-acetaminophen (PERCOCET/ROXICET) 5-325 MG tablet Take 1 tablet by mouth every 6 (six) hours as needed for moderate pain. 07/30/17   Dagoberto Ligas, PA-C  Phenylephrine-Acetaminophen (SINUS HEADACHE PE MAX ST) 5-325 MG TABS Take 1 tablet by mouth daily as needed (sinus headaches).    [provider]  repaglinide (PRANDIN) 1 MG tablet Take 1 mg by mouth 2 (two) times daily. 11/27/17   [provider]  rosuvastatin (CRESTOR) 5 MG tablet Take 5 mg by mouth at bedtime.     [provider]  VALTREX 500 MG tablet Take 500 mg by mouth 2 (two) times daily as needed (for outbreaks).  02/19/13   [provider]    Family History Family History  Problem  Relation Age of Onset  . Heart attack Father   . Coronary artery disease Father        strong family hx  . Bladder Cancer Mother        bladder  . Leukemia Brother   . Breast cancer Maternal Aunt   . Breast cancer Paternal Aunt   . Breast cancer Maternal Aunt     Social History Social History   Tobacco Use  . Smoking status: Current Every Day Smoker    Packs/day: 1.00    Years: 55.00    Pack years: 55.00    Types: Cigarettes  . Smokeless tobacco: Never Used  . Tobacco comment: pt has patches   Substance Use Topics  . Alcohol use: Yes    Comment: 1 beer per month  . Drug use: No     Allergies   Avelox [moxifloxacin]; Chantix [varenicline]; Fosamax [alendronate]; Pradaxa [dabigatran etexilate mesylate]; Statins; Sulfamethoxazole; Sulfonamide derivatives; Ciprofloxacin; Gabapentin; and Tape   Review of Systems Review of Systems  Constitutional: Negative for chills and fever.  Eyes: Negative for visual disturbance.  Respiratory: Positive for shortness of breath. Negative for cough.   Cardiovascular: Positive for chest pain.  Gastrointestinal: Negative for abdominal pain, diarrhea, nausea, rectal pain and vomiting.  Musculoskeletal: Positive for myalgias (bilateral upper extremities) and neck pain.  Neurological: Positive for headaches (resolved at present). Negative for syncope, weakness and numbness.       Positive for off balance s/p admission for stroke 11/11.   All other systems reviewed and are negative.  Physical Exam Updated Vital Signs BP (!) 129/92   Pulse (!) 138   Temp 98.5 F (36.9 C) (Oral)   Resp 18   SpO2 95%   Physical Exam  Constitutional: She appears well-developed and well-nourished.  Non-toxic appearance. No distress.  HENT:  Head: Normocephalic and atraumatic. Head is without raccoon's eyes and without Battle's sign.  Mouth/Throat: Uvula is midline.  Eyes: Conjunctivae and EOM are normal. Right eye exhibits no discharge. Left eye exhibits  no discharge.  Neck: Normal range of motion. Neck supple. No JVD present. Muscular tenderness (tender to bilateral paraspinal muscles as well as tender to the bilateral SCM muscles) present. No spinous process  tenderness present.  Cardiovascular: Intact distal pulses. An irregularly irregular rhythm present. Tachycardia present.  Pulmonary/Chest: Effort normal and breath sounds normal. No respiratory distress. She has no wheezes. She has no rhonchi. She has no rales.  Respiration even and unlabored  Abdominal: Soft. She exhibits no distension. There is no tenderness.  Musculoskeletal:  RLE w/ lymphedema which patient reports is chronic.  No obvious deformity, erythema, ecchymosis, or significant open wounds.  Upper extremities: Normal AROM. Mildly tender to proximal upper arms bilaterally without point/focal bony tenderness.  Back: no midline tenderness Lower extremities: Normal ROM, nontender.   Neurological: She is alert.  Alert. Clear speech. No facial droop. CNIII-XII grossly intact. Bilateral upper and lower extremities' sensation grossly intact. 5/5 symmetric strength with grip strength and with plantar and dorsi flexion bilaterally. Normal finger to nose bilaterally. Negative pronator drift.   Skin: Skin is warm and dry. No rash noted.  Psychiatric: She has a normal mood and affect. Her behavior is normal.  Nursing note and vitals reviewed.    ED Treatments / Results  Labs (all labs ordered are listed, but only abnormal results are displayed) Labs Reviewed  BASIC METABOLIC PANEL - Abnormal; Notable for the following components:      Result Value   Glucose, Bld 339 (*)    All other components within normal limits  CBC - Abnormal; Notable for the following components:   RBC 5.51 (*)    Hemoglobin 16.9 (*)    HCT 51.1 (*)    All other components within normal limits  URINALYSIS, ROUTINE W REFLEX MICROSCOPIC - Abnormal; Notable for the following components:   Color, Urine RED (*)     Glucose, UA >=500 (*)    Ketones, ur 5 (*)    Leukocytes, UA SMALL (*)    All other components within normal limits  CBG MONITORING, ED - Abnormal; Notable for the following components:   Glucose-Capillary 358 (*)    All other components within normal limits  CBG MONITORING, ED - Abnormal; Notable for the following components:   Glucose-Capillary 259 (*)    All other components within normal limits  TROPONIN I  I-STAT TROPONIN, ED  CBG MONITORING, ED    EKG EKG Interpretation  Date/Time:  Friday January 10 2018 23:50:17 EST Ventricular Rate:  64 PR Interval:    QRS Duration: 131 QT Interval:  377 QTC Calculation: 389 R Axis:   -86 Text Interpretation:  Sinus rhythm RBBB and LAFB Probable left ventricular hypertrophy ST elevation, consider inferior injury Baseline wander in lead(s) V4 last tracing was rapid afib  Confirmed by Wandra Arthurs [phone]) on 01/11/2018 12:02:08 AM   Radiology Dg Chest 2 View  Result Date: 01/10/2018 CLINICAL DATA:  Hyperglycemia today. Shortness of breath. History of COPD, diabetes, hypertension, current smoker. EXAM: CHEST - 2 VIEW COMPARISON:  06/23/2017 FINDINGS: Normal heart size and pulmonary vascularity. No focal airspace disease or consolidation in the lungs. No blunting of costophrenic angles. No pneumothorax. Mediastinal contours appear intact. Postoperative changes in the cervical spine. Thoracolumbar scoliosis convex towards the left. IMPRESSION: No active cardiopulmonary disease. Electronically Signed   By: Lucienne Capers M.D.   On: 01/10/2018 23:04    Procedures Procedures (including critical care time)  CRITICAL CARE Performed by: Kennith Maes   Total critical care time: 45 minutes  Critical care time was exclusive of separately billable procedures and treating other patients.  Critical care was necessary to treat or prevent imminent or life-threatening deterioration.  Critical care was  time spent personally by me on  the following activities: development of treatment plan with patient and/or surrogate as well as nursing, discussions with consultants, evaluation of patient's response to treatment, examination of patient, obtaining history from patient or surrogate, ordering and performing treatments and interventions, ordering and review of laboratory studies, ordering and review of radiographic studies, pulse oximetry and re-evaluation of patient's condition.  Medications Ordered in ED Medications  diltiazem (CARDIZEM) injection 20 mg (20 mg Intravenous Given 01/10/18 2318)  sodium chloride 0.9 % bolus 1,000 mL (0 mLs Intravenous Stopped 01/11/18 0118)  etomidate (AMIDATE) injection 12 mg (12 mg Intravenous Given 01/10/18 2346)  ondansetron (ZOFRAN) injection 4 mg (4 mg Intravenous Given 01/11/18 0012)    Echocardiogram 12/31/17:  Study Conclusions  - Left ventricle: The cavity size was normal. There was mild   concentric hypertrophy. Systolic function was normal. The   estimated ejection fraction was in the range of 50% to 55%.   Probable hypokinesis of the apical anterolateral and   inferolateral myocardium; consistent with ischemia in the   distribution of a diagonal/ramus intermedius artery or proximal   OM branch of the left circumflex coronary artery. Doppler   parameters are consistent with abnormal left ventricular   relaxation (grade 1 diastolic dysfunction). Doppler parameters   are consistent with elevated mean left atrial filling pressure. A   mid-diastolic &quot;L&quot; wave is seen, consistent with elevated left   atrial pressure. - Mitral valve: There was mild regurgitation. - Left atrium: The atrium was mildly dilated. - Atrial septum: No defect or patent foramen ovale was identified.   Last cardiac catheterization in [place] in 2015: She underwent stenting of a first diagonal that was 95% occluded  Initial Impression / Assessment and Plan / ED Course  I have reviewed the  triage vital signs and the nursing notes.  Pertinent labs & imaging results that were available during my care of the patient were reviewed by me and considered in my medical decision making (see chart for details).   Patient w/ extensive past medical hx who presents to the ED with multiple complaints including neck/upper extremity pain which she attributes secondary to cane use, hyperglycemia after drinking a slurpy tonight, as well as some chest pain and dyspnea which are improving at present. Patient nontoxic appearing, in no apparent distress.  - Her neck/arm pain is reproducible with muscular palpation, she is without acute focal neuro deficits, suspect MSK related, doubt dissection or ischemic/hemorrhagic CVA.  - Hyperglycemia s/p slurpy consumption- glucose elevated to 358. Trending down in the ER to 259. Labs do not appear consistent with DKA. PCP recheck w/ continued management.  - Dyspnea/CP- She is notably in afib w/ RVR on the monitor ranging from 140s-160s- suspect this as cause. Patient with hx of PAF CHADVASC of 5, long term anticoagulation on eliquis, she has hx of prior successful cardioversion. Discussed with supervising physician Dr. Darl Householder, trial of cardizem, subsequent cardioversion performed w/ conscious sedation which I was present for- please see Dr. [name] note for further details. Cardioversion successful, patient converted to NSR, subsequently bradycardic which per chart review is consistent with her baseline HR. She did have an episode of orthostatic hypotension following cardioversion, fluids administered w/ improvement, VSS w/ repeat orthostatics. Trop negative, doubt ACS. CXR negative. Patient requesting discharge, she appears safe for discharge home at this time with close cardiology follow up. .  I discussed results, treatment plan, need for follow-up, and return precautions with the patient and friend at  bedside. Provided opportunity for questions, patient confirmed  understanding and is in agreement with plan.    Final Clinical Impressions(s) / ED Diagnoses   Final diagnoses:  Atrial fibrillation with RVR [facility])  Hyperglycemia    ED Discharge Orders    None       Amaryllis Dyke, PA-C 01/11/18 0165    Drenda Freeze, MD 01/11/18 1500

## 2018-01-10 NOTE — ED Triage Notes (Addendum)
Pt reports hyperglycemia today. Above 350 at home. She was brought in after an episode of SOB. Hx of 4 CVAs. She is also complaining of arm pain that she attributes to trying to learn to walk with a cane. Pt also reports neck pain along her carotids bilaterally. A&Ox4. Ambulatory with cane. Also reports an episode of SOB, but that has resolved.

## 2018-01-10 NOTE — ED Notes (Signed)
Pt aware urine sample is needed 

## 2018-01-11 LAB — CBG MONITORING, ED: Glucose-Capillary: 259 mg/dL — ABNORMAL HIGH (ref 70–99)

## 2018-01-11 MED ORDER — ONDANSETRON HCL 4 MG/2ML IJ SOLN
4.0000 mg | Freq: Once | INTRAMUSCULAR | Status: AC
  Administered 2018-01-11: 4 mg via INTRAVENOUS

## 2018-01-11 MED ORDER — ONDANSETRON HCL 4 MG/2ML IJ SOLN
INTRAMUSCULAR | Status: AC
  Administered 2018-01-11: 4 mg via INTRAVENOUS
  Filled 2018-01-11: qty 2

## 2018-01-11 NOTE — Discharge Instructions (Signed)
You were seen in the emergency department today for hyperglycemia, chest pains or shortness of breath, and some neck and arm discomfort.  Your heart rate was elevated with a rhythm consistent with atrial fibrillation with rapid ventricular response, in other words the top portion of your heart was beating too quickly.  We performed cardioversion which was successful in the emergency department.  Please continue to take your Eliquis as prescribed.  We would like you to follow-up with your cardiologist in the next 3 days for reevaluation.  Your blood sugar was high at 358 when you arrived, this improved to 259 in the emergency department.  Please continue to monitor this at home and take your diabetes medications as prescribed.  You also had glucose and ketones in your urine.  These are all things that should be rechecked by primary care provider in the next 3 to 5 days.  Please up as discussed above.  Please return to the ER for new or worsening symptoms or any other concerns.

## 2018-01-11 NOTE — ED Notes (Signed)
Patient is awake and alert conversing with family members

## 2018-01-11 NOTE — ED Notes (Signed)
Patient up to chair/on bedpan-void ~200 cc's. Patient with SBP 80's-assisted back to bed-fluid bolus initiated-EDPA Sam made aware-BP increased after being back in bed-will continue to monitor

## 2018-01-11 NOTE — ED Notes (Signed)
MP SB 50's-no complaints of pain states still nauseated-requesting ice chips-given

## 2018-01-11 NOTE — ED Provider Notes (Signed)
Physical Exam  BP 140/82 (BP Location: Right Arm)   Pulse (!) 54   Temp 98.5 F (36.9 C) (Oral)   Resp 18   SpO2 100%   Physical Exam  ED Course/Procedures     .Sedation Date/Time: 01/11/2018 2:55 PM Performed by: Drenda Freeze, MD Authorized by: Drenda Freeze, MD   Consent:    Consent obtained:  Written (electronic informed consent)   Risks discussed:  Allergic reaction, dysrhythmia, inadequate sedation, nausea, vomiting, respiratory compromise necessitating ventilatory assistance and intubation, prolonged sedation necessitating reversal and prolonged hypoxia resulting in organ damage Universal protocol:    Procedure explained and questions answered to patient or proxy's satisfaction: yes     Relevant documents present and verified: yes     Test results available and properly labeled: yes     Imaging studies available: yes     Required blood products, implants, devices, and special equipment available: yes     Immediately prior to procedure a time out was called: yes     Patient identity confirmation method:  Arm band Pre-sedation assessment:    Time since last food or drink:  3 hours   ASA classification: class 1 - normal, healthy patient     Mallampati score:  I - soft palate, uvula, fauces, pillars visible   Pre-sedation assessments completed and reviewed: airway patency   Immediate pre-procedure details:    Reassessment: Patient reassessed immediately prior to procedure     Reviewed: vital signs, relevant labs/tests and NPO status     Verified: bag valve mask available, emergency equipment available, intubation equipment available, IV patency confirmed, oxygen available, reversal medications available and suction available   Procedure details (see MAR for exact dosages):    Intra-procedure monitoring:  Blood pressure monitoring, continuous pulse oximetry, cardiac monitor, frequent vital sign checks and frequent LOC assessments   Intra-procedure events: none      Total Provider sedation time (minutes):  30 Post-procedure details:    Attendance: Constant attendance by certified staff until patient recovered     Recovery: Patient returned to pre-procedure baseline     Post-sedation assessments completed and reviewed: airway patency, cardiovascular function, hydration status, mental status and respiratory function     Patient is stable for discharge or admission: yes     Patient tolerance:  Tolerated well, no immediate complications .Cardioversion Date/Time: 01/11/2018 2:56 PM Performed by: Drenda Freeze, MD Authorized by: Drenda Freeze, MD   Consent:    Consent obtained:  Verbal   Consent given by:  Patient   Risks discussed:  Cutaneous burn, death and induced arrhythmia   Alternatives discussed:  No treatment Pre-procedure details:    Cardioversion basis:  Emergent   Rhythm:  Atrial fibrillation Patient sedated: Yes. Refer to sedation procedure documentation for details of sedation.  Attempt one:    Cardioversion mode:  Synchronous   Waveform:  Biphasic   Shock (Joules):  200   Shock outcome:  Conversion to normal sinus rhythm Post-procedure details:    Patient status:  Awake   Patient tolerance of procedure:  Tolerated well, no immediate complications    MDM  Medical screening examination/treatment/procedure(s) were conducted as a shared visit with non-physician practitioner(s) and myself.  I personally evaluated the patient during the encounter.  EKG Interpretation  Date/Time:  Friday January 10 2018 23:50:17 EST Ventricular Rate:  64 PR Interval:    QRS Duration: 131 QT Interval:  377 QTC Calculation: 389 R Axis:   -86 Text Interpretation:  Sinus rhythm RBBB and LAFB Probable left ventricular hypertrophy ST elevation, consider inferior injury Baseline wander in lead(s) V4 last tracing was rapid afib  Confirmed by Wandra Arthurs [phone]) on 01/11/2018 12:02:08 AM Also confirmed by Wandra Arthurs [phone]), editor Lynder Parents [phone])  on 01/11/2018 11:04:35 AM  Melanie Lucas is a 67 y.o. female history of A. fib on Eliquis (CHADVASC of 5), diabetes, recent stroke here presenting with shortness of breath.  She states that she had a slow to be around 3 hours prior to arrival and had sudden onset of shortness of breath.  She did not have any palpitations or chest pain.  Patient called her son, who is a paramedic and made her come to the ER.  On arrival, patient was found to be in rapid A. fib with a rate of 160s.  Patient states that she is compliant with her Eliquis.  She has a nonfocal neuro exam currently.  We initially attempted to rate control with Cardizem 20 mg IV but her heart rate still in the 1 40-1 50s.  Since she is anticoagulated for years, we performed cardioversion under conscious sedation. She then converted back to sinus rhythm. She then woke up and was able tolerate PO prior to discharge. She will follow up with cardiology.  CRITICAL CARE Performed by: Wandra Arthurs   Total critical care time: 30 minutes  Critical care time was exclusive of separately billable procedures and treating other patients.  Critical care was necessary to treat or prevent imminent or life-threatening deterioration.  Critical care was time spent personally by me on the following activities: development of treatment plan with patient and/or surrogate as well as nursing, discussions with consultants, evaluation of patient's response to treatment, examination of patient, obtaining history from patient or surrogate, ordering and performing treatments and interventions, ordering and review of laboratory studies, ordering and review of radiographic studies, pulse oximetry and re-evaluation of patient's condition.        Drenda Freeze, MD 01/11/18 [phone]

## 2018-01-11 NOTE — ED Notes (Signed)
Pt CBG 259. RN notified

## 2018-01-11 NOTE — ED Notes (Signed)
Patient complaining of nausea-medicated with Zofran 4 mg IV

## 2018-01-15 ENCOUNTER — Other Ambulatory Visit: Payer: Self-pay | Admitting: *Deleted

## 2018-01-15 NOTE — Patient Outreach (Signed)
[facility]) Care Management  01/15/2018  Melanie Lucas 1950-06-26 [id number]   Care coordination-follow up on ADT alert     Cm received an ADT alert of patient ED visit for Atrial fibrillation  [facility] RN CM called patient  Patient is able to verify HIPAA Reviewed and addressed ADT alert with patient  Pt confirms issues with atrial fibrillation on 01/10/18. She reports doing better since "they shocked me back into rhythm"  She reports she is walking a much as she can. She reports she had been using a cane and is now not using a cane but notes issues with some dizziness Fall prevention discussed although she denies falls at this time CM inquired about her BP She is not checking it She is noted to be on ASA, eliquis, lasix, tikosyn, duloxetine, MVI and crestor.  She reports increase voiding so she decreased her lasix CM discussed taking the medications as ordered, the Afib and CHF action plans and s/s   Transition of care services noted to be completed by primary care MD office staff- [facility] at [facility]- she confirms having medications, DME and f/u with cardiologist  Social Melanie Lucas is confirming today she is at home alone as her husband, Melanie Lucas remains hospitalized after knee surgery and will be transferred to [place] care for rehab today, 01/15/18  She confirms she has no issues driving  Her Daughter, Melanie Lucas, lives in [place], did not go to the home to assist the pt, and is now in [place] for the holiday.  Her other daughter lives in another state  She confirms she did see Dr Via, Primary care MD on Monday 01/13/18 and he recommends therapy at home 3-4 days Pt is questioning the need for therapy. [facility] RN CM encouraged her to have therapy to get stronger to be able to assist her husband in the future.  She voiced understanding    Conditions: Atrial fibrillation, HTN, CAD, pulmonary HTN, ischemia stroke, TIA, COPD, DM, smoker, HLD, hx  of bilateral leg edema, depression, cervical spondylosis with myelopathy  Medications: Reports not taking as ordered by reports she has them all  Appointments: to see Cardiologist Dr Rayann Heman on 01/22/18   Consent: [facility] RN CM reviewed [facility] services with patient. Patient gave verbal consent for services for [facility] RN CM with her permission today 01/15/18   Plan [facility] RN CM will refer Melanie Lucas Community RN CM (fall risks, Insurance:  [employer] medicare  [facility] admissions x 2 ED visits x 2 in the last 6 months Disease management for CVA, Afib, HTN, COPD, smoker, hx of non adherence, poor of support system)  Melanie Lack L. Lavina Hamman, RN, BSN, [place] Coordinator Office number [phone] Mobile number [phone]  Main [facility] number [phone] Fax number [phone]

## 2018-01-20 ENCOUNTER — Other Ambulatory Visit: Payer: Self-pay | Admitting: *Deleted

## 2018-01-20 NOTE — Patient Outreach (Signed)
[facility]) Care Management  01/20/2018  CHARNITA TRUDEL 1950-04-07 [id number]   RN spoke with pt briefly today and introduced [facility] and the purpose for today's call. Discuss pt's current issues related to spouse currently in rehab and her pending PT services ordered by her provider last week for rehab from a recent CVA. Pt states the agency has not contacted her yet but she is awaiting a call. RN has encouraged pt to follow up with her provider later in this week concerning what agency and to contact the agency if she wishes to expedite services. Pt states she understandings. Other area discussed as followed:  COPD- Pt states no issues or problems to address DM-Pt states she has issues with her CBG around 250-350 and this has been the case for the past few years. Pt reports she takes Synjardy with the Metformin 1000 BID. No insulin at this time as pt wishes to manage without the insulin. RN discussed the risk and pt has agreed to possible make changes to improve her glucose levels. RN further inquired on specialist as pt reports she has a endocrinologist. Pt indicates she was standing on the phone and needed to sit. RN encouraged pt to sit however pt indicated she needed to get off of the phone.Pt states she has a lot going on with heating issues and other family issues. RN offered to call back or intervene at this time. Pt did not object to a call back in a few days. RN also offered a consult with a Social worker to possible assist with her heating issues (pt receptive) indicating she "didn't care".   Plan at this time based upon the limited information on this call. RN will follow up later in this week and make a referral to a social worker to possible assist pt further with the above barrier. RN was not able to establish [facility] RN case management involvement on this call but will further intervene this week on the next call.  Raina Mina, RN Care Management Coordinator [place] Office [phone]

## 2018-01-21 ENCOUNTER — Observation Stay (HOSPITAL_COMMUNITY): Payer: Medicare Other

## 2018-01-21 ENCOUNTER — Emergency Department (HOSPITAL_COMMUNITY): Payer: Medicare Other

## 2018-01-21 ENCOUNTER — Encounter (HOSPITAL_COMMUNITY): Payer: Self-pay

## 2018-01-21 ENCOUNTER — Observation Stay (HOSPITAL_COMMUNITY)
Admission: EM | Admit: 2018-01-21 | Discharge: 2018-01-22 | Disposition: A | Discharge status: Home / Self Care | Payer: Medicare Other | Attending: Internal Medicine | Admitting: Internal Medicine

## 2018-01-21 DIAGNOSIS — I4891 Unspecified atrial fibrillation: Secondary | ICD-10-CM | POA: Diagnosis present

## 2018-01-21 DIAGNOSIS — I639 Cerebral infarction, unspecified: Secondary | ICD-10-CM | POA: Diagnosis not present

## 2018-01-21 DIAGNOSIS — Z79899 Other long term (current) drug therapy: Secondary | ICD-10-CM | POA: Diagnosis not present

## 2018-01-21 DIAGNOSIS — E1140 Type 2 diabetes mellitus with diabetic neuropathy, unspecified: Secondary | ICD-10-CM | POA: Diagnosis not present

## 2018-01-21 DIAGNOSIS — Z7982 Long term (current) use of aspirin: Secondary | ICD-10-CM | POA: Diagnosis not present

## 2018-01-21 DIAGNOSIS — I2510 Atherosclerotic heart disease of native coronary artery without angina pectoris: Secondary | ICD-10-CM | POA: Diagnosis present

## 2018-01-21 DIAGNOSIS — F329 Major depressive disorder, single episode, unspecified: Secondary | ICD-10-CM | POA: Diagnosis not present

## 2018-01-21 DIAGNOSIS — I10 Essential (primary) hypertension: Secondary | ICD-10-CM | POA: Diagnosis present

## 2018-01-21 DIAGNOSIS — E785 Hyperlipidemia, unspecified: Secondary | ICD-10-CM | POA: Diagnosis not present

## 2018-01-21 DIAGNOSIS — I480 Paroxysmal atrial fibrillation: Secondary | ICD-10-CM | POA: Diagnosis not present

## 2018-01-21 DIAGNOSIS — F32A Depression, unspecified: Secondary | ICD-10-CM | POA: Diagnosis present

## 2018-01-21 DIAGNOSIS — Z85828 Personal history of other malignant neoplasm of skin: Secondary | ICD-10-CM | POA: Diagnosis not present

## 2018-01-21 DIAGNOSIS — J449 Chronic obstructive pulmonary disease, unspecified: Secondary | ICD-10-CM | POA: Diagnosis present

## 2018-01-21 DIAGNOSIS — E8841 MELAS syndrome: Principal | ICD-10-CM | POA: Insufficient documentation

## 2018-01-21 DIAGNOSIS — Z955 Presence of coronary angioplasty implant and graft: Secondary | ICD-10-CM | POA: Diagnosis not present

## 2018-01-21 DIAGNOSIS — R531 Weakness: Secondary | ICD-10-CM | POA: Diagnosis present

## 2018-01-21 DIAGNOSIS — F17210 Nicotine dependence, cigarettes, uncomplicated: Secondary | ICD-10-CM | POA: Diagnosis not present

## 2018-01-21 DIAGNOSIS — E1169 Type 2 diabetes mellitus with other specified complication: Secondary | ICD-10-CM | POA: Diagnosis present

## 2018-01-21 DIAGNOSIS — F17200 Nicotine dependence, unspecified, uncomplicated: Secondary | ICD-10-CM | POA: Diagnosis present

## 2018-01-21 DIAGNOSIS — E119 Type 2 diabetes mellitus without complications: Secondary | ICD-10-CM

## 2018-01-21 DIAGNOSIS — R2990 Unspecified symptoms and signs involving the nervous system: Secondary | ICD-10-CM | POA: Diagnosis present

## 2018-01-21 LAB — URINALYSIS, ROUTINE W REFLEX MICROSCOPIC
Bilirubin Urine: NEGATIVE
HGB URINE DIPSTICK: NEGATIVE
Ketones, ur: 5 mg/dL — AB
Nitrite: NEGATIVE
PH: 8 (ref 5.0–8.0)
PROTEIN: NEGATIVE mg/dL
SPECIFIC GRAVITY, URINE: 1.037 — AB (ref 1.005–1.030)

## 2018-01-21 LAB — COMPREHENSIVE METABOLIC PANEL
ALK PHOS: 71 U/L (ref 38–126)
ALT: 17 U/L (ref 0–44)
AST: 15 U/L (ref 15–41)
Albumin: 3.5 g/dL (ref 3.5–5.0)
Anion gap: 10 (ref 5–15)
BUN: 12 mg/dL (ref 8–23)
CALCIUM: 8.9 mg/dL (ref 8.9–10.3)
CO2: 29 mmol/L (ref 22–32)
Chloride: 102 mmol/L (ref 98–111)
Creatinine, Ser: 0.71 mg/dL (ref 0.44–1.00)
Glucose, Bld: 244 mg/dL — ABNORMAL HIGH (ref 70–99)
Potassium: 3.5 mmol/L (ref 3.5–5.1)
SODIUM: 141 mmol/L (ref 135–145)
Total Bilirubin: 1 mg/dL (ref 0.3–1.2)
Total Protein: 5.9 g/dL — ABNORMAL LOW (ref 6.5–8.1)

## 2018-01-21 LAB — CBC WITH DIFFERENTIAL/PLATELET
ABS IMMATURE GRANULOCYTES: 0.03 10*3/uL (ref 0.00–0.07)
Basophils Absolute: 0 10*3/uL (ref 0.0–0.1)
Basophils Relative: 1 %
Eosinophils Absolute: 0.1 10*3/uL (ref 0.0–0.5)
Eosinophils Relative: 2 %
HEMATOCRIT: 48.7 % — AB (ref 36.0–46.0)
Hemoglobin: 15.6 g/dL — ABNORMAL HIGH (ref 12.0–15.0)
IMMATURE GRANULOCYTES: 0 %
LYMPHS PCT: 23 %
Lymphs Abs: 1.6 10*3/uL (ref 0.7–4.0)
MCH: 29.9 pg (ref 26.0–34.0)
MCHC: 32 g/dL (ref 30.0–36.0)
MCV: 93.5 fL (ref 80.0–100.0)
MONO ABS: 0.5 10*3/uL (ref 0.1–1.0)
MONOS PCT: 8 %
NEUTROS ABS: 4.5 10*3/uL (ref 1.7–7.7)
NEUTROS PCT: 66 %
Platelets: 171 10*3/uL (ref 150–400)
RBC: 5.21 MIL/uL — ABNORMAL HIGH (ref 3.87–5.11)
RDW: 12.6 % (ref 11.5–15.5)
WBC: 6.8 10*3/uL (ref 4.0–10.5)
nRBC: 0 % (ref 0.0–0.2)

## 2018-01-21 LAB — GLUCOSE, CAPILLARY: Glucose-Capillary: 337 mg/dL — ABNORMAL HIGH (ref 70–99)

## 2018-01-21 LAB — I-STAT TROPONIN, ED: TROPONIN I, POC: 0 ng/mL (ref 0.00–0.08)

## 2018-01-21 MED ORDER — ONDANSETRON HCL 4 MG PO TABS: 4.0000 mg | ORAL_TABLET | Freq: Four times a day (QID) | ORAL | Status: DC | PRN

## 2018-01-21 MED ORDER — DOFETILIDE 500 MCG PO CAPS
500.0000 ug | ORAL_CAPSULE | Freq: Two times a day (BID) | ORAL | Status: DC
  Administered 2018-01-21 – 2018-01-22 (×2): 500 ug via ORAL
  Filled 2018-01-21 (×2): qty 1

## 2018-01-21 MED ORDER — INSULIN ASPART 100 UNIT/ML ~~LOC~~ SOLN
0.0000 [IU] | Freq: Three times a day (TID) | SUBCUTANEOUS | Status: DC
  Administered 2018-01-22 (×2): 2 [IU] via SUBCUTANEOUS

## 2018-01-21 MED ORDER — ALBUTEROL SULFATE (2.5 MG/3ML) 0.083% IN NEBU: 3.0000 mL | INHALATION_SOLUTION | Freq: Four times a day (QID) | RESPIRATORY_TRACT | Status: DC | PRN

## 2018-01-21 MED ORDER — INSULIN ASPART 100 UNIT/ML ~~LOC~~ SOLN
0.0000 [IU] | Freq: Every day | SUBCUTANEOUS | Status: DC
  Administered 2018-01-21: 4 [IU] via SUBCUTANEOUS

## 2018-01-21 MED ORDER — SODIUM CHLORIDE 0.9% FLUSH
3.0000 mL | Freq: Two times a day (BID) | INTRAVENOUS | Status: DC
  Administered 2018-01-21 – 2018-01-22 (×2): 3 mL via INTRAVENOUS

## 2018-01-21 MED ORDER — APIXABAN 5 MG PO TABS
5.0000 mg | ORAL_TABLET | Freq: Two times a day (BID) | ORAL | Status: DC
  Administered 2018-01-21 – 2018-01-22 (×2): 5 mg via ORAL
  Filled 2018-01-21 (×2): qty 1

## 2018-01-21 MED ORDER — ACETAMINOPHEN 650 MG RE SUPP: 650.0000 mg | Freq: Four times a day (QID) | RECTAL | Status: DC | PRN

## 2018-01-21 MED ORDER — ROSUVASTATIN CALCIUM 5 MG PO TABS
5.0000 mg | ORAL_TABLET | Freq: Every day | ORAL | Status: DC
  Administered 2018-01-21: 5 mg via ORAL
  Filled 2018-01-21: qty 1

## 2018-01-21 MED ORDER — ACETAMINOPHEN 325 MG PO TABS
650.0000 mg | ORAL_TABLET | Freq: Four times a day (QID) | ORAL | Status: DC | PRN
  Administered 2018-01-22: 650 mg via ORAL
  Filled 2018-01-21: qty 2

## 2018-01-21 MED ORDER — GADOBUTROL 1 MMOL/ML IV SOLN
7.0000 mL | Freq: Once | INTRAVENOUS | Status: AC | PRN
  Administered 2018-01-21: 7 mL via INTRAVENOUS

## 2018-01-21 MED ORDER — ONDANSETRON HCL 4 MG/2ML IJ SOLN: 4.0000 mg | Freq: Four times a day (QID) | INTRAMUSCULAR | Status: DC | PRN

## 2018-01-21 MED ORDER — DULOXETINE HCL 20 MG PO CPEP
40.0000 mg | ORAL_CAPSULE | Freq: Every day | ORAL | Status: DC
  Administered 2018-01-21 – 2018-01-22 (×2): 40 mg via ORAL
  Filled 2018-01-21 (×2): qty 2

## 2018-01-21 MED ORDER — IBUPROFEN 200 MG PO TABS: 600.0000 mg | ORAL_TABLET | Freq: Four times a day (QID) | ORAL | Status: DC | PRN

## 2018-01-21 MED ORDER — ASPIRIN EC 81 MG PO TBEC
81.0000 mg | DELAYED_RELEASE_TABLET | Freq: Every day | ORAL | Status: DC
  Administered 2018-01-21 – 2018-01-22 (×2): 81 mg via ORAL
  Filled 2018-01-21 (×2): qty 1

## 2018-01-21 NOTE — H&P (Signed)
History and Physical   Melanie Lucas QIO:[id number] DOB: 1950/06/27 DOA: 01/21/2018  Referring MD/NP/PA: Dr. Lacinda Axon, [place] PCP: Via, Lennette Bihari, MD  Patient coming from: Home  Chief Complaint: Weakness  HPI: Melanie Lucas is a 67 y.o. female with a history of HTN, HLD, T2DM, COPD, lymphedema, TIA/CVA, left carotid stenosis s/p CEA, CAD, PAF on eliquis, chronic HFpEF, tobacco use, depression and anxiety who presented to the ED with weakness. She reported awakening in her normal state of health this morning, going to the kitchen to make coffee and having an abrupt sensation of inability to move legs which was severe and constant, lasting several minutes. She sat down and felt fecal urgency so went to the bathroom but had incontinence. She felt this was similar to prior TIA experiences she's had so she called EMS but had difficulty saying the words she wanted to say. EMS brought her to the ED where symptoms have been resolved. CT of the head showed no acute changes and MRI without contrast demonstrated new findings including scattered bihemispheric foci. EDP spoke with neurology who recommended admission for MRI w/contrast of the brain, and MRI w/o and w/contrast of the cervical spine due to concern for demyelinating disease.   Review of Systems: Denies fever, chills, weight loss, Has abnormalities in vision she finds difficult to explain but denies blackout/spots/photopsia, blurring or diplopia. Has meniere's but no recent changes to hearing. Currently has a bifrontal headache. No cough, sore throat, chest pain, palpitations, shortness of breath, abdominal pain, nausea, vomiting, blood in stool, change in bladder habits, myalgias, arthralgias, or rash, and per HPI. All others reviewed and are negative.   Past Medical History:  Diagnosis Date  . Anxiety   . Cancer (HCC)    Skin- leg   . Chronic acquired lymphedema    a. R>L;  b. 03/2012 Neg LE U/S for DVT. Legs since age 33  . Complication of anesthesia    woke up during colonoscopy, Lithrostrixpy, Biospy  . COPD (chronic obstructive pulmonary disease) ([place])   . Coronary artery disease   . CVD (cardiovascular disease)   . Deaf, right   . Diabetes mellitus    Type II  . Diabetic neuropathy ([place])   . Dysrhythmia    afib  . Elevated TSH    a. 10/2012 - inst to f/u PCP.  . Full dentures   . Hematuria    a. while on pradaxa,  she reports that she has seen Dr Margie Ege and had low risk cystoscopy  . History of kidney stones   . Hyperlipidemia   . Hypertension    patient denies  . Incontinence    prior to urinating  . Lacunar infarction ([place])    a. 02/2009 non-acute Lacunar infarct of the right thalamus noted on MRI of brain.  . Left carotid stenosis   . Lymphedema of extremity    had this problem, since she was a teenager. especially seen in R [place]  . Meniere disease   . Myocardial infarction ([place])   . Obesity   . Osteoarthritis    cervical & lumbar region, knees, hands cramp also   . PAF with post-termination pauses    a. on dronedarone;  b. CHA2DS2VASc = 5 (HTN, DM, h/o lacunar infarct on MRI, Female) ->refused oral anticoagulation after h/o hematuria on pradaxa;  c. 02/2012 Echo: EF 55-60%, mildly dil LA. D. Recurrent PAF 10/2012 after only taking Multaq 1x/day - spont conv to NSR, placed back on BID Multaq/eliquis;  e.  10/2014 Multaq d/c'd->tikosyn initiated.  . Shortness of breath dyspnea    with exertion  . Stroke [facility])    2 mini strokes  . Tobacco abuse    Past Surgical History:  Procedure Laterality Date  . ANTERIOR CERVICAL CORPECTOMY N/A 02/21/2016   Procedure: ANTERIOR CERVICAL CORPECTOMY ANS FUSION CERVICAL SIX , ANTERIOR PLATING CERVICAL FIVE-SEVEN;  Surgeon: Consuella Lose, MD;  Location: [facility];  Service: Neurosurgery;  Laterality: N/A;  . ANTERIOR FUSION CERVICAL SPINE  02/21/2016  . Biopsy of adrenal glands    . BREAST BIOPSY Bilateral    benign results, both breasts   . CARDIAC CATHETERIZATION  2014   x1 stent placed,  done in [place]   . CARDIOVERSION N/A 11/11/2014   Procedure: CARDIOVERSION;  Surgeon: Larey Dresser, MD;  Location: [facility];  Service: Cardiovascular;  Laterality: N/A;  . CAROTID ENDARTERECTOMY Left 07/29/2017  . COLONOSCOPY W/ POLYPECTOMY    . CORONARY STENT PLACEMENT    . ENDARTERECTOMY Left 07/29/2017   Procedure: ENDARTERECTOMY CAROTID LEFT;  Surgeon: Rosetta Posner, MD;  Location: [place];  Service: Vascular;  Laterality: Left;  . PATCH ANGIOPLASTY Left 07/29/2017   Procedure: PATCH ANGIOPLASTY USING HEMASHIELD GOLD VASCULAR PATCH;  Surgeon: Rosetta Posner, MD;  Location: [facility];  Service: Vascular;  Laterality: Left;  . TEE WITHOUT CARDIOVERSION N/A 11/11/2014   Procedure: TRANSESOPHAGEAL ECHOCARDIOGRAM (TEE);  Surgeon: Larey Dresser, MD;  Location[facility] ENDOSCOPY;  Service: Cardiovascular;  Laterality: N/A;   - Smoker, occasional alcohol, lives alone right now since husband is at [facility] post TKA.  reports that she has been smoking cigarettes. She has a 55.00 pack-year smoking history. She has never used smokeless tobacco. She reports that she drinks alcohol. She reports that she does not use drugs. Allergies  Allergen Reactions  . Avelox [Moxifloxacin]     Unknown reaction   . Chantix [Varenicline]     nightmares  . Fosamax [Alendronate]     Pain all over  . Pradaxa [Dabigatran Etexilate Mesylate]     Extreme bleeding  . Statins Nausea And Vomiting and Other (See Comments)    Muscle pain, Dizziness (intolerance)  . Sulfamethoxazole Nausea And Vomiting    Dizziness (intolerance)  . Sulfonamide Derivatives Hives    Nausea vertigo  . Ciprofloxacin Rash  . Gabapentin Rash    burning  . Tape Itching and Rash    Please use "paper" tape only   Family History  Problem Relation Age of Onset  . Heart attack Father   . Coronary artery disease Father        strong family hx  . Bladder Cancer Mother        bladder  . Leukemia Brother   . Breast cancer Maternal Aunt   . Breast  cancer Paternal Aunt   . Breast cancer Maternal Aunt    - Family history otherwise reviewed and not pertinent.  Prior to Admission medications   Medication Sig Start Date End Date Taking? Authorizing Provider  albuterol (PROVENTIL HFA;VENTOLIN HFA) 108 (90 Base) MCG/ACT inhaler Inhale 2 puffs into the lungs every 6 (six) hours as needed for wheezing or shortness of breath.    Yes [provider]  aspirin EC 81 MG tablet Take 1 tablet (81 mg total) by mouth daily. 06/24/17  Yes Gherghe, Vella Redhead, MD  dofetilide (TIKOSYN) 500 MCG capsule TAKE 1 CAPSULE (500 MCG TOTAL) 2 (TWO) TIMES DAILY BY MOUTH. 01/07/18  Yes Thompson Grayer, MD  DULOXETINE HCL  PO Take 40 mg by mouth daily.    Yes [provider]  ELIQUIS 5 MG TABS tablet TAKE 1 TABLET (5 MG TOTAL) BY MOUTH 2 (TWO) TIMES DAILY. 03/14/17  Yes Allred, Jeneen Rinks, MD  Empagliflozin-metFORMIN HCl (SYNJARDY) 12.06-998 MG TABS Take 1 tablet by mouth 2 (two) times daily.   Yes [provider]  furosemide (LASIX) 20 MG tablet Take 20 mg by mouth as needed for edema.    Yes [provider]  ibuprofen (ADVIL,MOTRIN) 800 MG tablet Take 800 mg by mouth 3 (three) times daily as needed for mild pain.    Yes [provider]  Multiple Vitamin (MULTIVITAMIN) tablet Take 1 tablet by mouth at bedtime.    Yes [provider]  rosuvastatin (CRESTOR) 5 MG tablet Take 5 mg by mouth at bedtime.    Yes [provider]    Physical Exam: Vitals:   01/21/18 1630 01/21/18 1700 01/21/18 1715 01/21/18 1730  BP: (!) 149/77 (!) 164/80 (!) 166/76   Pulse:  69 (!) 57 63  Resp:  16 16 13   Temp:      TempSrc:      SpO2:  97% 98% 99%   Constitutional: Chronically ill-appearing female in no distress, calm demeanor Eyes: Lids and conjunctivae normal, PERRL ENMT: Mucous membranes are moist. Posterior pharynx clear of any exudate or lesions. Poor dentition.  Neck: normal, supple, no masses, no thyromegaly Respiratory:  Non-labored breathing room air without accessory muscle use. Clear breath sounds to auscultation bilaterally Cardiovascular: Regular rate and rhythm, no murmurs, rubs, or gallops. No carotid bruits. No JVD. Palpable pedal pulses. Abdomen: Normoactive bowel sounds. No tenderness, non-distended, and no masses palpated. No hepatosplenomegaly. GU: No indwelling catheter Musculoskeletal: No clubbing / cyanosis. No joint deformity upper and lower extremities. Good ROM, no contractures. Normal muscle tone. +Nonpitting edema to lower extremities bilaterally. Skin: Warm, dry. No rashes, wounds, or ulcers. 2 small scabs on left arm/hand. Neurologic: CN II-XII grossly intact. Gait steady, narrow-based. Speech with occasional misnaming and word blocking but no slurring. No focal deficits in motor strength or sensation in all extremities.  Psychiatric: Alert and oriented x3. Normal judgment and insight. Mood anxious with broad affect.   Labs on Admission: I have personally reviewed following labs and imaging studies  CBC: Recent Labs  Lab 01/21/18 1229  WBC 6.8  NEUTROABS 4.5  HGB 15.6*  HCT 48.7*  MCV 93.5  PLT 299   Basic Metabolic Panel: Recent Labs  Lab 01/21/18 1229  NA 141  K 3.5  CL 102  CO2 29  GLUCOSE 244*  BUN 12  CREATININE 0.71  CALCIUM 8.9   GFR: CrCl cannot be calculated (Unknown ideal weight.). Liver Function Tests: Recent Labs  Lab 01/21/18 1229  AST 15  ALT 17  ALKPHOS 71  BILITOT 1.0  PROT 5.9*  ALBUMIN 3.5   No results for input(s): LIPASE, AMYLASE in the last 168 hours. No results for input(s): AMMONIA in the last 168 hours. Coagulation Profile: No results for input(s): INR, PROTIME in the last 168 hours. Cardiac Enzymes: No results for input(s): CKTOTAL, CKMB, CKMBINDEX, TROPONINI in the last 168 hours. BNP (last 3 results) No results for input(s): PROBNP in the last 8760 hours. HbA1C: No results for input(s): HGBA1C in the last 72 hours. CBG: No  results for input(s): GLUCAP in the last 168 hours. Lipid Profile: No results for input(s): CHOL, HDL, LDLCALC, TRIG, CHOLHDL, LDLDIRECT in the last 72 hours. Thyroid Function Tests: No results  for input(s): TSH, T4TOTAL, FREET4, T3FREE, THYROIDAB in the last 72 hours. Anemia Panel: No results for input(s): VITAMINB12, FOLATE, FERRITIN, TIBC, IRON, RETICCTPCT in the last 72 hours. Urine analysis:    Component Value Date/Time   COLORURINE YELLOW 01/21/2018 1254   APPEARANCEUR CLEAR 01/21/2018 1254   LABSPEC 1.037 (H) 01/21/2018 1254   PHURINE 8.0 01/21/2018 1254   GLUCOSEU >=500 (A) 01/21/2018 1254   HGBUR NEGATIVE 01/21/2018 1254   BILIRUBINUR NEGATIVE 01/21/2018 1254   BILIRUBINUR negative 10/17/2015 1839   KETONESUR 5 (A) 01/21/2018 1254   PROTEINUR NEGATIVE 01/21/2018 1254   UROBILINOGEN 0.2 10/17/2015 1839   UROBILINOGEN 1.0 09/20/2010 0025   NITRITE NEGATIVE 01/21/2018 1254   LEUKOCYTESUR TRACE (A) 01/21/2018 1254    No results found for this or any previous visit (from the past 240 hour(s)).   Radiological Exams on Admission: Ct Head Wo Contrast  Result Date: 01/21/2018 CLINICAL DATA:  Sudden onset of generalized weakness. Aphasia. Incontinence. EXAM: CT HEAD WITHOUT CONTRAST TECHNIQUE: Contiguous axial images were obtained from the base of the skull through the vertex without intravenous contrast. COMPARISON:  December 30, 2017 FINDINGS: Brain: No subdural, epidural, or subarachnoid hemorrhage. Scattered white matter changes and a small right thalamic lacunar infarct are stable. No acute cortical ischemia or infarct. No mass effect or midline shift. Ventricles and sulci are prominent but stable. Cerebellum, brainstem, and basal cisterns are normal. Vascular: Calcified atherosclerosis in the intracranial carotids. Skull: Normal. Negative for fracture or focal lesion. Sinuses/Orbits: Mucosal thickening in scattered ethmoid air cells. Sinuses, mastoid air cells, and middle ears are  otherwise well aerated. Other: None. IMPRESSION: 1. Chronic white matter changes. Small stable right thalamic lacunar infarct. No acute intracranial abnormalities noted. 2. No other acute abnormalities are identified. Electronically Signed   By: Dorise Bullion III M.D   On: 01/21/2018 13:32   Mr Brain Wo Contrast (neuro Protocol)  Result Date: 01/21/2018 CLINICAL DATA:  Sudden onset of weakness beginning 1050 hours today. EXAM: MRI HEAD WITHOUT CONTRAST TECHNIQUE: Multiplanar, multiecho pulse sequences of the brain and surrounding structures were obtained without intravenous contrast. COMPARISON:  CT earlier same day. MRI 12/30/2017. multiple previous studies as distant as 10/12/2014 FINDINGS: Brain: Today's examination is becoming more worrisome for demyelinating disease as an underlying diagnosis as compared with chronic small-vessel ischemic change. There are several scattered foci showing restricted diffusion within the cerebral hemispheric white matter, but coming larger and more conspicuous in comparison to the study of 12/30/2017. the largest is in the white matter adjacent to the posterior body of the right lateral ventricle but there are several other smaller foci scattered within both hemispheres. Elsewhere, the brain shows extensive patchy involvement of the pons, possibly with some involvement of the middle cerebellar peduncles. Extensive patchy involvement elsewhere throughout the cerebral hemispheric deep and subcortical white matter. Scattered small vessel infarctions in the right thalamus which are old. No large vessel territory infarction. No mass lesion, hemorrhage, hydrocephalus or extra-axial collection. Vascular: Major vessels at the base of the brain show flow. Skull and upper cervical spine: Negative Sinuses/Orbits: Clear/normal Other: None IMPRESSION: Scattered foci of restricted diffusion within the cerebral hemispheric white matter bilaterally, progressive and more conspicuous than on  the study 12/30/2017. The pattern is suggestive of active demyelinating disease/multiple sclerosis. The differential diagnosis does include small-vessel ischemic changes and changes related to vasculitis. I think the patient probably does have at least a degree of ordinary small vessel ischemic change in addition, but I am beginning to doubt  if that is the dominant or primary process in this case. Electronically Signed   By: Nelson Chimes M.D.   On: 01/21/2018 16:09    EKG: Independently reviewed. Sinus bradycardia with RBBB, LAFB. Conduction abnormalities are not new. No ischemic ST changes.  Assessment/Plan Active Problems:   Stroke-like episode (HCC)   Stroke-like episode, concern for recurrent stroke vs. demyelinating disease: Recently had full stroke work up which will not need to be repeated.  - MRI brain w/contrast, MRI cervical spine w/o and w/contrast per neurology - No empiric steroids at this time - LP relatively contraindicated in setting of recent eliquis (last dose yesterday PM). D/w neurology, Dr. Rory Percy, will not start heparin now, ok to continue eliquis. At risk of embolic event.  Tobacco use:  - Nicotine patch, cessation counseling to be provided prior to discharge.   Paroxysmal AFib:  - Continue eliquis  - Continue tikosyn, maintain K >4 and Mg >2, QTc interval on ECG was 456 msec  CAD: Neg troponin, no ischemic ECG changes and no chest pain.  - Continue ASA, statin  Hyperlipidemia:  - Continue crestor, note other statin allergies.  T2DM: HbA1c recently 9.3%. Needs better control.  - SSI - Hold home medication including metformin.   Lymphedema: Dx at early age, stable.  - No indication for lasix at this time.   COPD: No exacerbation  Depression:  - Continue duloxetine  DVT prophylaxis: Eliquis  Code Status: Full  Family Communication: None at bedside Disposition Plan: Per neurology Consults called: Neurology, Dr. Rory Percy  Admission status: Observation.     Patrecia Pour, MD [facility] [email or web address] Password [facility] 01/21/2018, 6:26 PM

## 2018-01-21 NOTE — ED Notes (Signed)
Arora, MD at bedside.  

## 2018-01-21 NOTE — Discharge Instructions (Addendum)
Continue to be compliant with her medications.  Cut back on smoking if you can.  Return to the ED if symptoms worsen.

## 2018-01-21 NOTE — ED Provider Notes (Signed)
[facility] EMERGENCY DEPARTMENT Provider Note   CSN: [id number] Arrival date & time:        History   Chief Complaint Chief Complaint  Patient presents with  . Weakness    HPI Melanie Lucas is a 67 y.o. female.  The history is provided by the patient.  Weakness  Primary symptoms include speech change.  Primary symptoms include no focal weakness, no loss of sensation, no loss of balance, no memory loss, no movement disorder, no visual change, no auditory change, and no dizziness. Primary symptoms comment: general weakness. This is a recurrent problem. The current episode started 1 to 2 hours ago. The problem has been resolved. There was no focality noted. There has been no fever. The fever has been present for less than 1 day. Pertinent negatives include no shortness of breath, no chest pain, no vomiting, no altered mental status, no confusion and no headaches. Associated medical issues include CVA.    Past Medical History:  Diagnosis Date  . Anxiety   . Cancer (HCC)    Skin- leg   . Chronic acquired lymphedema    a. R>L;  b. 03/2012 Neg LE U/S for DVT. Legs since age 16  . Complication of anesthesia    woke up during colonoscopy, Lithrostrixpy, Biospy  . COPD (chronic obstructive pulmonary disease) ([place])   . Coronary artery disease   . CVD (cardiovascular disease)   . Deaf, right   . Diabetes mellitus    Type II  . Diabetic neuropathy ([place])   . Dysrhythmia    afib  . Elevated TSH    a. 10/2012 - inst to f/u PCP.  . Full dentures   . Hematuria    a. while on pradaxa,  she reports that she has seen Dr Margie Ege and had low risk cystoscopy  . History of kidney stones   . Hyperlipidemia   . Hypertension    patient denies  . Incontinence    prior to urinating  . Lacunar infarction ([place])    a. 02/2009 non-acute Lacunar infarct of the right thalamus noted on MRI of brain.  . Left carotid stenosis   . Lymphedema of extremity    had this problem, since she  was a teenager. especially seen in R LE  . Meniere disease   . Myocardial infarction ([place])   . Obesity   . Osteoarthritis    cervical & lumbar region, knees, hands cramp also   . PAF with post-termination pauses    a. on dronedarone;  b. CHA2DS2VASc = 5 (HTN, DM, h/o lacunar infarct on MRI, Female) ->refused oral anticoagulation after h/o hematuria on pradaxa;  c. 02/2012 Echo: EF 55-60%, mildly dil LA. D. Recurrent PAF 10/2012 after only taking Multaq 1x/day - spont conv to NSR, placed back on BID Multaq/eliquis;  e. 10/2014 Multaq d/c'd->tikosyn initiated.  . Shortness of breath dyspnea    with exertion  . Stroke [facility])    2 mini strokes  . Tobacco abuse     Patient Active Problem List   Diagnosis Date Noted  . Acute metabolic encephalopathy 66/07/3014  . Depression 12/31/2017  . Stroke ([place]) 12/31/2017  . COPD (chronic obstructive pulmonary disease) ([place]) 12/30/2017  . Left carotid artery stenosis 07/29/2017  . TIA (transient ischemic attack) 06/24/2017  . Ischemic stroke ([place]) 06/23/2017  . Cervical spondylosis with myelopathy 02/21/2016  . Bilateral leg edema 11/24/2014  . Cervical stenosis of spine 11/24/2014  . Numbness of fingers of both hands  11/24/2014  . HLD (hyperlipidemia) 11/09/2014  . Diabetes mellitus without complication ([place]) 40/98/1191  . Pulmonary hypertension ([place]) 08/07/2013  . CAD (coronary artery disease) 03/11/2013  . Peripheral edema 02/25/2012  . Essential hypertension, benign 05/06/2009  . Overweight 04/13/2009  . TOBACCO ABUSE 04/13/2009  . A-fib ([place]) 04/13/2009  . CHEST PAIN, ATYPICAL 04/13/2009    Past Surgical History:  Procedure Laterality Date  . ANTERIOR CERVICAL CORPECTOMY N/A 02/21/2016   Procedure: ANTERIOR CERVICAL CORPECTOMY ANS FUSION CERVICAL SIX , ANTERIOR PLATING CERVICAL FIVE-SEVEN;  Surgeon: Consuella Lose, MD;  Location: [facility];  Service: Neurosurgery;  Laterality: N/A;  . ANTERIOR FUSION CERVICAL SPINE  02/21/2016  . Biopsy of  adrenal glands    . BREAST BIOPSY Bilateral    benign results, both breasts   . CARDIAC CATHETERIZATION  2014   x1 stent placed, done in [place]   . CARDIOVERSION N/A 11/11/2014   Procedure: CARDIOVERSION;  Surgeon: Larey Dresser, MD;  Location: [facility];  Service: Cardiovascular;  Laterality: N/A;  . CAROTID ENDARTERECTOMY Left 07/29/2017  . COLONOSCOPY W/ POLYPECTOMY    . CORONARY STENT PLACEMENT    . ENDARTERECTOMY Left 07/29/2017   Procedure: ENDARTERECTOMY CAROTID LEFT;  Surgeon: Rosetta Posner, MD;  Location: [facility];  Service: Vascular;  Laterality: Left;  . PATCH ANGIOPLASTY Left 07/29/2017   Procedure: PATCH ANGIOPLASTY USING HEMASHIELD GOLD VASCULAR PATCH;  Surgeon: Rosetta Posner, MD;  Location: [facility];  Service: Vascular;  Laterality: Left;  . TEE WITHOUT CARDIOVERSION N/A 11/11/2014   Procedure: TRANSESOPHAGEAL ECHOCARDIOGRAM (TEE);  Surgeon: Larey Dresser, MD;  Location: [facility];  Service: Cardiovascular;  Laterality: N/A;     OB History   None      Home Medications    Prior to Admission medications   Medication Sig Start Date End Date Taking? Authorizing Provider  albuterol (PROVENTIL HFA;VENTOLIN HFA) 108 (90 Base) MCG/ACT inhaler Inhale 2 puffs into the lungs every 6 (six) hours as needed for wheezing or shortness of breath.     [provider]  aspirin EC 81 MG tablet Take 1 tablet (81 mg total) by mouth daily. 06/24/17   Caren Griffins, MD  dofetilide (TIKOSYN) 500 MCG capsule TAKE 1 CAPSULE (500 MCG TOTAL) 2 (TWO) TIMES DAILY BY MOUTH. 01/07/18   Allred, Jeneen Rinks, MD  DULOXETINE HCL PO Take 40 mg by mouth daily.     [provider]  ELIQUIS 5 MG TABS tablet TAKE 1 TABLET (5 MG TOTAL) BY MOUTH 2 (TWO) TIMES DAILY. 03/14/17   Allred, Jeneen Rinks, MD  Empagliflozin-metFORMIN HCl (SYNJARDY) 12.06-998 MG TABS Take 1 tablet by mouth 2 (two) times daily.    [provider]  furosemide (LASIX) 20 MG tablet Take 20 mg by mouth as needed for  edema.     [provider]  ibuprofen (ADVIL,MOTRIN) 800 MG tablet Take 800 mg by mouth 3 (three) times daily as needed for mild pain.     [provider]  Multiple Vitamin (MULTIVITAMIN) tablet Take 1 tablet by mouth at bedtime.     [provider]  rosuvastatin (CRESTOR) 5 MG tablet Take 5 mg by mouth at bedtime.     [provider]    Family History Family History  Problem Relation Age of Onset  . Heart attack Father   . Coronary artery disease Father        strong family hx  . Bladder Cancer Mother        bladder  .  Leukemia Brother   . Breast cancer Maternal Aunt   . Breast cancer Paternal Aunt   . Breast cancer Maternal Aunt     Social History Social History   Tobacco Use  . Smoking status: Current Every Day Smoker    Packs/day: 1.00    Years: 55.00    Pack years: 55.00    Types: Cigarettes  . Smokeless tobacco: Never Used  . Tobacco comment: pt has patches   Substance Use Topics  . Alcohol use: Yes    Comment: 1 beer per month  . Drug use: No     Allergies   Avelox [moxifloxacin]; Chantix [varenicline]; Fosamax [alendronate]; Pradaxa [dabigatran etexilate mesylate]; Statins; Sulfamethoxazole; Sulfonamide derivatives; Ciprofloxacin; Gabapentin; and Tape   Review of Systems Review of Systems  Constitutional: Negative for chills and fever.  HENT: Negative for ear pain and sore throat.   Eyes: Negative for pain and visual disturbance.  Respiratory: Negative for cough and shortness of breath.   Cardiovascular: Negative for chest pain and palpitations.  Gastrointestinal: Negative for abdominal pain and vomiting.  Genitourinary: Negative for dysuria and hematuria.  Musculoskeletal: Negative for arthralgias and back pain.  Skin: Negative for color change and rash.  Neurological: Positive for speech change, speech difficulty and weakness. Negative for dizziness, tremors, focal weakness, seizures, syncope, facial asymmetry,  light-headedness, numbness, headaches and loss of balance.  Psychiatric/Behavioral: Negative for confusion and memory loss.  All other systems reviewed and are negative.    Physical Exam Updated Vital Signs  ED Triage Vitals  Enc Vitals Group     BP 01/21/18 1145 130/87     Pulse Rate 01/21/18 1145 65     Resp 01/21/18 1145 16     Temp 01/21/18 1145 97.9 F (36.6 C)     Temp Source 01/21/18 1145 Oral     SpO2 01/21/18 1145 100 %     Weight --      Height --      Head Circumference --      Peak Flow --      Pain Score 01/21/18 1146 0     Pain Loc --      Pain Edu? --      Excl. in [place]? --     Physical Exam  Constitutional: She is oriented to person, place, and time. She appears well-developed and well-nourished. No distress.  HENT:  Head: Normocephalic and atraumatic.  Eyes: Pupils are equal, round, and reactive to light. Conjunctivae and EOM are normal.  Neck: Normal range of motion. Neck supple.  Cardiovascular: Normal rate, regular rhythm, normal heart sounds and intact distal pulses.  No murmur heard. Pulmonary/Chest: Effort normal and breath sounds normal. No respiratory distress.  Abdominal: Soft. There is no tenderness.  Musculoskeletal: Normal range of motion. She exhibits edema (chronic lymphedema ).  Neurological: She is alert and oriented to person, place, and time. No cranial nerve deficit or sensory deficit. She exhibits normal muscle tone. Coordination normal.  5+/5 strength, normal sensation, no drift, normal finger to nose finger, normal speech  Skin: Skin is warm and dry. Capillary refill takes less than 2 seconds.  Psychiatric: She has a normal mood and affect.  Nursing note and vitals reviewed.    ED Treatments / Results  Labs (all labs ordered are listed, but only abnormal results are displayed) Labs Reviewed  COMPREHENSIVE METABOLIC PANEL - Abnormal; Notable for the following components:      Result Value   Glucose, Bld 244 (*)  Total Protein  5.9 (*)    All other components within normal limits  URINALYSIS, ROUTINE W REFLEX MICROSCOPIC - Abnormal; Notable for the following components:   Specific Gravity, Urine 1.037 (*)    Glucose, UA >=500 (*)    Ketones, ur 5 (*)    Leukocytes, UA TRACE (*)    Bacteria, UA RARE (*)    All other components within normal limits  CBC WITH DIFFERENTIAL/PLATELET - Abnormal; Notable for the following components:   RBC 5.21 (*)    Hemoglobin 15.6 (*)    HCT 48.7 (*)    All other components within normal limits  I-STAT TROPONIN, ED    EKG EKG Interpretation  Date/Time:  Tuesday January 21 2018 11:42:56 EST Ventricular Rate:  57 PR Interval:    QRS Duration: 134 QT Interval:  468 QTC Calculation: 456 R Axis:   -80 Text Interpretation:  Sinus rhythm RBBB and LAFB Probable left ventricular hypertrophy Confirmed by Lennice Sites [phone]) on 01/21/2018 12:04:21 PM   Radiology Ct Head Wo Contrast  Result Date: 01/21/2018 CLINICAL DATA:  Sudden onset of generalized weakness. Aphasia. Incontinence. EXAM: CT HEAD WITHOUT CONTRAST TECHNIQUE: Contiguous axial images were obtained from the base of the skull through the vertex without intravenous contrast. COMPARISON:  December 30, 2017 FINDINGS: Brain: No subdural, epidural, or subarachnoid hemorrhage. Scattered white matter changes and a small right thalamic lacunar infarct are stable. No acute cortical ischemia or infarct. No mass effect or midline shift. Ventricles and sulci are prominent but stable. Cerebellum, brainstem, and basal cisterns are normal. Vascular: Calcified atherosclerosis in the intracranial carotids. Skull: Normal. Negative for fracture or focal lesion. Sinuses/Orbits: Mucosal thickening in scattered ethmoid air cells. Sinuses, mastoid air cells, and middle ears are otherwise well aerated. Other: None. IMPRESSION: 1. Chronic white matter changes. Small stable right thalamic lacunar infarct. No acute intracranial abnormalities noted. 2.  No other acute abnormalities are identified. Electronically Signed   By: Dorise Bullion III M.D   On: 01/21/2018 13:32   Mr Brain Wo Contrast (neuro Protocol)  Result Date: 01/21/2018 CLINICAL DATA:  Sudden onset of weakness beginning 1050 hours today. EXAM: MRI HEAD WITHOUT CONTRAST TECHNIQUE: Multiplanar, multiecho pulse sequences of the brain and surrounding structures were obtained without intravenous contrast. COMPARISON:  CT earlier same day. MRI 12/30/2017. multiple previous studies as distant as 10/12/2014 FINDINGS: Brain: Today's examination is becoming more worrisome for demyelinating disease as an underlying diagnosis as compared with chronic small-vessel ischemic change. There are several scattered foci showing restricted diffusion within the cerebral hemispheric white matter, but coming larger and more conspicuous in comparison to the study of 12/30/2017. the largest is in the white matter adjacent to the posterior body of the right lateral ventricle but there are several other smaller foci scattered within both hemispheres. Elsewhere, the brain shows extensive patchy involvement of the pons, possibly with some involvement of the middle cerebellar peduncles. Extensive patchy involvement elsewhere throughout the cerebral hemispheric deep and subcortical white matter. Scattered small vessel infarctions in the right thalamus which are old. No large vessel territory infarction. No mass lesion, hemorrhage, hydrocephalus or extra-axial collection. Vascular: Major vessels at the base of the brain show flow. Skull and upper cervical spine: Negative Sinuses/Orbits: Clear/normal Other: None IMPRESSION: Scattered foci of restricted diffusion within the cerebral hemispheric white matter bilaterally, progressive and more conspicuous than on the study 12/30/2017. The pattern is suggestive of active demyelinating disease/multiple sclerosis. The differential diagnosis does include small-vessel ischemic changes and  changes related  to vasculitis. I think the patient probably does have at least a degree of ordinary small vessel ischemic change in addition, but I am beginning to doubt if that is the dominant or primary process in this case. Electronically Signed   By: Nelson Chimes M.D.   On: 01/21/2018 16:09    Procedures Procedures (including critical care time)  Medications Ordered in ED Medications - No data to display   Initial Impression / Assessment and Plan / ED Course  I have reviewed the triage vital signs and the nursing notes.  Pertinent labs & imaging results that were available during my care of the patient were reviewed by me and considered in my medical decision making (see chart for details).     LYLIE BLACKLOCK is a 67 year old female with history of tobacco abuse, COPD, CAD, diabetes, stroke, A. fib on Eliquis who presents to the ED with strokelike symptoms.  Patient with normal vitals.  No fever.  Patient had several minutes of a aphasia and general body weakness prior to arrival that has now resolved.  Patient states that she has some incontinence of urine but appears may be she was not able to get to the bathroom in time.  Patient had stroke last month.  She is neurologically intact on exam now.  She states that she is under a lot of stress as her husband is currently in rehab for recent knee injury.  She has been alone at home.  She has not been eating and drinking well.  Patient overall is well-appearing.  Normal neurological exam.  Will obtain basic labs, CT of the head, MRI.  Contacted Dr. Malen Gauze with neurology who is agreeable with the plan.  At this time patient had recent admission and had recent stroke work-up.  If there are no new findings patient is okay for discharge to home and outpatient follow-up as she is already medically optimized from a stroke standpoint.  Education was given to the patient and the family about needing to be adherent to medications and to try to stop  smoking.  Patient had no significant anemia, no electrolyte abnormality, kidney injury.  No urinary tract infection.  CT scan showed no acute findings.  MRI was obtained that was suggestive of possibly a demyelinating process such as MS.  Contacted Dr. Malen Gauze again with neurology who will evaluate images and patient for further recommendations.  Pt handed off to Dr. Lacinda Axon with patient awaiting final neuro disposition.  Final Clinical Impressions(s) / ED Diagnoses   Final diagnoses:  Weakness    ED Discharge Orders    None       Lennice Sites, DO 01/21/18 1640

## 2018-01-21 NOTE — Consult Note (Addendum)
Neurology Consultation  Reason for Consult: Generalized weakness and bilateral lower legs Referring Physician: Ronnald Nian  CC: Generalized weakness in bilateral lower legs  History is obtained from: Patient  HPI: Melanie Lucas is a 67 y.o. female with history of tobacco abuse, Mnire's disease, stroke, PAF with post ablation on Eliquis, left carotid stenosis, lymphedema, incontinence, hypertension, hyperlipidemia, dysrhythmia, diabetes, COPD.  Patient has been to the hospital multiple times in the past and seen neurology.  Majority of these visits were thought to be TIAs or strokes as she had strokes on MRI.   She is even had visits where although she has had a stroke the symptoms did not correlate with where the location of the stroke was.   Today patient was fine until approximately 10:30 in the morning at that time patient noted that she was very weak in her legs.  As she made her way to go to the bathroom she had significant fecal incontinence.  Patient called her neighbor upstairs who told her to go to the ED.  Initially she did not want to go but later decided to come.   MRI of the brain was obtained however this time the MRI was noted to be more worrisome for demyelinating disease as an underlying diagnosis as compared with small vessel ischemic changes.   The largest is in the white matter adjacent to the posterior body of the right lateral ventricle but there are also several other small foci scattered within both hemispheres.  Currently patient is not having any issues.  She is chronically weaker on her left arm and leg but this is not new.   ED course: MRI of brain  ROS: ROS was performed and is negative except as noted in the HPI.   Past Medical History:  Diagnosis Date  . Anxiety   . Cancer (HCC)    Skin- leg   . Chronic acquired lymphedema    a. R>L;  b. 03/2012 Neg LE U/S for DVT. Legs since age 45  . Complication of anesthesia    woke up during colonoscopy, Lithrostrixpy,  [facility]  . COPD (chronic obstructive pulmonary disease) ([place])   . Coronary artery disease   . CVD (cardiovascular disease)   . Deaf, right   . Diabetes mellitus    Type II  . Diabetic neuropathy ([place])   . Dysrhythmia    afib  . Elevated TSH    a. 10/2012 - inst to f/u PCP.  . Full dentures   . Hematuria    a. while on pradaxa,  she reports that she has seen Dr Margie Ege and had low risk cystoscopy  . History of kidney stones   . Hyperlipidemia   . Hypertension    patient denies  . Incontinence    prior to urinating  . Lacunar infarction ([place])    a. 02/2009 non-acute Lacunar infarct of the right thalamus noted on MRI of brain.  . Left carotid stenosis   . Lymphedema of extremity    had this problem, since she was a teenager. especially seen in R LE  . Meniere disease   . Myocardial infarction ([place])   . Obesity   . Osteoarthritis    cervical & lumbar region, knees, hands cramp also   . PAF with post-termination pauses    a. on dronedarone;  b. CHA2DS2VASc = 5 (HTN, DM, h/o lacunar infarct on MRI, Female) ->refused oral anticoagulation after h/o hematuria on pradaxa;  c. 02/2012 Echo: EF 55-60%, mildly dil LA. D.  Recurrent PAF 10/2012 after only taking Multaq 1x/day - spont conv to NSR, placed back on BID Multaq/eliquis;  e. 10/2014 Multaq d/c'd->tikosyn initiated.  . Shortness of breath dyspnea    with exertion  . Stroke [facility])    2 mini strokes  . Tobacco abuse      Family History  Problem Relation Age of Onset  . Heart attack Father   . Coronary artery disease Father        strong family hx  . Bladder Cancer Mother        bladder  . Leukemia Brother   . Breast cancer Maternal Aunt   . Breast cancer Paternal Aunt   . Breast cancer Maternal Aunt      Social History:   reports that she has been smoking cigarettes. She has a 55.00 pack-year smoking history. She has never used smokeless tobacco. She reports that she drinks alcohol. She reports that she does not use  drugs.  Medications No current facility-administered medications for this encounter.   Current Outpatient Medications:  .  albuterol (PROVENTIL HFA;VENTOLIN HFA) 108 (90 Base) MCG/ACT inhaler, Inhale 2 puffs into the lungs every 6 (six) hours as needed for wheezing or shortness of breath. , Disp: , Rfl:  .  aspirin EC 81 MG tablet, Take 1 tablet (81 mg total) by mouth daily., Disp: 30 tablet, Rfl: 1 .  dofetilide (TIKOSYN) 500 MCG capsule, TAKE 1 CAPSULE (500 MCG TOTAL) 2 (TWO) TIMES DAILY BY MOUTH., Disp: 180 capsule, Rfl: 1 .  DULOXETINE HCL PO, Take 40 mg by mouth daily. , Disp: , Rfl:  .  ELIQUIS 5 MG TABS tablet, TAKE 1 TABLET (5 MG TOTAL) BY MOUTH 2 (TWO) TIMES DAILY., Disp: 60 tablet, Rfl: 9 .  Empagliflozin-metFORMIN HCl (SYNJARDY) 12.06-998 MG TABS, Take 1 tablet by mouth 2 (two) times daily., Disp: , Rfl:  .  furosemide (LASIX) 20 MG tablet, Take 20 mg by mouth as needed for edema. , Disp: , Rfl:  .  ibuprofen (ADVIL,MOTRIN) 800 MG tablet, Take 800 mg by mouth 3 (three) times daily as needed for mild pain. , Disp: , Rfl:  .  Multiple Vitamin (MULTIVITAMIN) tablet, Take 1 tablet by mouth at bedtime. , Disp: , Rfl:  .  rosuvastatin (CRESTOR) 5 MG tablet, Take 5 mg by mouth at bedtime. , Disp: , Rfl:    Exam: Current vital signs: BP (!) 145/98   Pulse 61   Temp 97.9 F (36.6 C) (Oral)   Resp 11   SpO2 100%  Vital signs in last 24 hours: Temp:  [97.9 F (36.6 C)] 97.9 F (36.6 C) (12/03 1145) Pulse Rate:  [61-68] 61 (12/03 1415) Resp:  [11-19] 11 (12/03 1415) BP: (130-150)/(69-98) 145/98 (12/03 1415) SpO2:  [98 %-100 %] 100 % (12/03 1415)  Physical Exam  Constitutional: Appears well-developed and well-nourished.  Psych: Affect appropriate to situation Eyes: No scleral injection HENT: No OP obstrucion Head: Normocephalic.  Cardiovascular: Irregularly irregular Respiratory: Effort normal, non-labored breathing GI: Soft.  No distension. There is no tenderness.  Skin:  WDI--right leg lymphedema more significant than left  Neuro: Mental Status: Patient is awake, alert, oriented to person, place, month, year, and situation. Patient is able to give a clear and coherent history. No signs of aphasia or neglect Cranial Nerves: II: Visual Fields are full. Pupils are equal, round, and reactive to light.   III,IV, VI: EOMI without ptosis or diploplia.  V: Facial sensation is symmetric to temperature VII: Facial movement  is symmetric.  VIII: hearing is intact to voice X: Uvula elevates symmetrically XI: Shoulder shrug is symmetric. XII: tongue is midline without atrophy or fasciculations.  Motor: 5/5 throughout with the exception of the left triceps extension and left leg knee flexion Sensory: Sensation is symmetric to light touch and temperature in the arms and legs. Deep Tendon Reflexes: Right arm and leg have 2+ reflexes.  Left arm has 3+ reflexes along with Hoffmann sign.  Left knee jerk is 3+. Plantars: Upgoing bilaterally Cerebellar: FNF within normal limits  Labs I have reviewed labs in epic and the results pertinent to this consultation are: CBC    Component Value Date/Time   WBC 6.8 01/21/2018 1229   RBC 5.21 (H) 01/21/2018 1229   HGB 15.6 (H) 01/21/2018 1229   HGB 14.8 11/26/2016 1550   HCT 48.7 (H) 01/21/2018 1229   HCT 43.5 11/26/2016 1550   PLT 171 01/21/2018 1229   PLT 180 11/26/2016 1550   MCV 93.5 01/21/2018 1229   MCV 91 11/26/2016 1550   MCH 29.9 01/21/2018 1229   MCHC 32.0 01/21/2018 1229   RDW 12.6 01/21/2018 1229   RDW 14.0 11/26/2016 1550   LYMPHSABS 1.6 01/21/2018 1229   LYMPHSABS 2.2 11/26/2016 1550   MONOABS 0.5 01/21/2018 1229   EOSABS 0.1 01/21/2018 1229   EOSABS 0.2 11/26/2016 1550   BASOSABS 0.0 01/21/2018 1229   BASOSABS 0.0 11/26/2016 1550   CMP     Component Value Date/Time   NA 141 01/21/2018 1229   NA 142 11/26/2016 1550   K 3.5 01/21/2018 1229   CL 102 01/21/2018 1229   CO2 29 01/21/2018 1229    GLUCOSE 244 (H) 01/21/2018 1229   BUN 12 01/21/2018 1229   BUN 15 11/26/2016 1550   CREATININE 0.71 01/21/2018 1229   CREATININE 0.77 10/17/2015 1824   CALCIUM 8.9 01/21/2018 1229   PROT 5.9 (L) 01/21/2018 1229   ALBUMIN 3.5 01/21/2018 1229   AST 15 01/21/2018 1229   ALT 17 01/21/2018 1229   ALKPHOS 71 01/21/2018 1229   BILITOT 1.0 01/21/2018 1229   GFRNONAA >60 01/21/2018 1229   GFRAA >60 01/21/2018 1229   Lipid Panel     Component Value Date/Time   CHOL 114 12/31/2017 0233   TRIG 114 12/31/2017 0233   HDL 46 12/31/2017 0233   CHOLHDL 2.5 12/31/2017 0233   VLDL 23 12/31/2017 0233   LDLCALC 45 12/31/2017 0233   Imaging I have reviewed the images obtained: MRI examination of the brain--scattered foci of restricted diffusion within the cerebral hemispheric white matter bilaterally, progressive and more can suspicious than on the study of 12/30/2017.  This pattern is suggestive of active demyelinating disease/multiple sclerosis.  Etta Quill PA-C [facility] [phone]  M-F  (9:00 am- 5:00 PM)  01/21/2018, 5:31 PM    Attending addendum Patient seen and examined. Agree with the history and physical documented above. I have independently examined the patient and obtained history. I also performed an independent review of systems.  Assessment:  This is a pleasant 67 year old female presenting to the hospital after multiple visits with generalized weakness and fecal incontinence.  Previous visits have all been thought to be strokes/TIAs however with findings of MRI brain without contrast done today, there is high suspicion that many of the symptoms likely could have been symptoms of demyelinating disease such as multiple sclerosis in addition to strokes that she has all the risk factors for strokes as well. Her age is in favor of a  vascular etiology such as strokes but not in favor of edema leading process but initial diagnosis of MS in a patient in their 60s is  not completely out of question. For this reason, we will further evaluate patient with a MRI of brain with contrast along with MRI cervical spine with contrast to evaluate for active lesions. A spinal tap would also be helpful modality for adding to the diagnostics but she is on Eliquis and I would not like to discontinue that at this time until we get all the imaging completed.  Impression: -Generalized weakness - Possible multiple sclerosis - Previous ischemic infarcts  Recommendations: -Recommend admitting patient to the hospitalist for observation. - We will obtain MRI with contrast of head and MRI with and without contrast of cervical spine to evaluate for active lesions -We will consider starting steroids based on imaging results and further review of images. - Unfortunately patient is on Eliquis and LP cannot be performed.  This will need to be done as an outpatient if needed. -She recently had a stroke work-up done last month.  No need to repeat the stroke work-up at this time.   -- Amie Portland, MD [facility] Pager: [phone] If 7pm to 7am, please call on call as listed on AMION.

## 2018-01-21 NOTE — ED Notes (Signed)
Purewick placed on pt. Waiting to get urine sample. 

## 2018-01-21 NOTE — ED Notes (Signed)
Pt transported to radiology for CT scan

## 2018-01-21 NOTE — ED Notes (Signed)
Patient transported to MRI 

## 2018-01-21 NOTE — ED Triage Notes (Signed)
Pt presents with sudden onset of generalized weakness at 10:50 today.  Pt reports standing in her kitchen with incontinence of urine and aphasia.  Pt has h/o CVA and TIA.

## 2018-01-22 ENCOUNTER — Ambulatory Visit: Payer: Medicare Other | Admitting: Internal Medicine

## 2018-01-22 DIAGNOSIS — I639 Cerebral infarction, unspecified: Secondary | ICD-10-CM | POA: Diagnosis not present

## 2018-01-22 DIAGNOSIS — F17200 Nicotine dependence, unspecified, uncomplicated: Secondary | ICD-10-CM

## 2018-01-22 DIAGNOSIS — I739 Peripheral vascular disease, unspecified: Secondary | ICD-10-CM | POA: Diagnosis not present

## 2018-01-22 DIAGNOSIS — E785 Hyperlipidemia, unspecified: Secondary | ICD-10-CM | POA: Diagnosis not present

## 2018-01-22 DIAGNOSIS — I10 Essential (primary) hypertension: Secondary | ICD-10-CM

## 2018-01-22 LAB — GLUCOSE, CAPILLARY
Glucose-Capillary: 194 mg/dL — ABNORMAL HIGH (ref 70–99)
Glucose-Capillary: 196 mg/dL — ABNORMAL HIGH (ref 70–99)

## 2018-01-22 LAB — BASIC METABOLIC PANEL
ANION GAP: 10 (ref 5–15)
BUN: 14 mg/dL (ref 8–23)
CO2: 27 mmol/L (ref 22–32)
Calcium: 8.6 mg/dL — ABNORMAL LOW (ref 8.9–10.3)
Chloride: 104 mmol/L (ref 98–111)
Creatinine, Ser: 0.68 mg/dL (ref 0.44–1.00)
GFR calc Af Amer: >60 mL/min (ref >60)
GFR calc non Af Amer: >60 mL/min (ref >60)
Glucose, Bld: 218 mg/dL — ABNORMAL HIGH (ref 70–99)
Potassium: 3.2 mmol/L — ABNORMAL LOW (ref 3.5–5.1)
Sodium: 141 mmol/L (ref 135–145)

## 2018-01-22 LAB — MAGNESIUM: Magnesium: 2.1 mg/dL (ref 1.7–2.4)

## 2018-01-22 MED ORDER — NICOTINE 14 MG/24HR TD PT24: 14.0000 mg | MEDICATED_PATCH | TRANSDERMAL | 0 refills | Status: AC

## 2018-01-22 MED ORDER — POTASSIUM CHLORIDE CRYS ER 20 MEQ PO TBCR
40.0000 meq | EXTENDED_RELEASE_TABLET | Freq: Once | ORAL | Status: AC
  Administered 2018-01-22: 40 meq via ORAL
  Filled 2018-01-22: qty 2

## 2018-01-22 NOTE — Progress Notes (Addendum)
Neurology Progress Note   S:// Seen and examined.  No acute changes.  No overnight complaints.  O:// Current vital signs: BP (!) 166/63 (BP Location: Right Arm)   Pulse (!) 53   Temp 98.1 F (36.7 C) (Oral)   Resp 17   SpO2 96%  Vital signs in last 24 hours: Temp:  [97.9 F (36.6 C)-98.2 F (36.8 C)] 98.1 F (36.7 C) (12/04 0746) Pulse Rate:  [53-69] 53 (12/04 0746) Resp:  [11-19] 17 (12/04 0746) BP: (130-166)/(63-98) 166/63 (12/04 0746) SpO2:  [94 %-100 %] 96 % (12/04 0746) UNCHANGED FROM YESTERDAY Physical Exam  Constitutional: Appears well-developed and well-nourished.  Psych: Affect appropriate to situation Eyes: No scleral injection HENT: No OP obstrucion Head: Normocephalic.  Cardiovascular: Irregularly irregular Respiratory: Effort normal, non-labored breathing GI: Soft.  No distension. There is no tenderness.  Skin: WDI--right leg lymphedema more significant than left Neuro: Mental Status: Patient is awake, alert, oriented to person, place, month, year, and situation. Patient is able to give a clear and coherent history. No signs of aphasia or neglect Cranial Nerves: II: Visual Fields are full. Pupils are equal, round, and reactive to light.   III,IV, VI: EOMI without ptosis or diploplia.  V: Facial sensation is symmetric to temperature VII: Facial movement is symmetric.  VIII: hearing is intact to voice X: Uvula elevates symmetrically XI: Shoulder shrug is symmetric. XII: tongue is midline without atrophy or fasciculations.  Motor: 5/5 throughout with the exception of the left triceps extension and left leg knee flexion which is 4+/5 Sensory: Sensation is symmetric to light touch and temperature in the arms and legs. Deep Tendon Reflexes: Right arm and leg have 2+ reflexes.  Left arm has 3+ reflexes along with Hoffmann sign.  Left knee jerk is 3+. Plantars: Upgoing bilaterally Cerebellar: FNF within normal limits  Medications  Current  Facility-Administered Medications:  .  acetaminophen (TYLENOL) tablet 650 mg, 650 mg, Oral, Q6H PRN, 650 mg at 01/22/18 0111 **OR** acetaminophen (TYLENOL) suppository 650 mg, 650 mg, Rectal, Q6H PRN, Vance Gather B, MD .  albuterol (PROVENTIL) (2.5 MG/3ML) 0.083% nebulizer solution 3 mL, 3 mL, Inhalation, Q6H PRN, Vance Gather B, MD .  apixaban (ELIQUIS) tablet 5 mg, 5 mg, Oral, BID, Vance Gather B, MD, 5 mg at 01/22/18 1000 .  aspirin EC tablet 81 mg, 81 mg, Oral, Daily, Vance Gather B, MD, 81 mg at 01/22/18 1000 .  dofetilide (TIKOSYN) capsule 500 mcg, 500 mcg, Oral, BID, Patrecia Pour, MD, 500 mcg at 01/22/18 1001 .  DULoxetine (CYMBALTA) DR capsule 40 mg, 40 mg, Oral, Daily, Vance Gather B, MD, 40 mg at 01/22/18 1001 .  ibuprofen (ADVIL,MOTRIN) tablet 600 mg, 600 mg, Oral, Q6H PRN, Vance Gather B, MD .  insulin aspart (novoLOG) injection 0-5 Units, 0-5 Units, Subcutaneous, QHS, Patrecia Pour, MD, 4 Units at 01/21/18 2132 .  insulin aspart (novoLOG) injection 0-9 Units, 0-9 Units, Subcutaneous, TID WC, Patrecia Pour, MD, 2 Units at 01/22/18 [phone] .  ondansetron (ZOFRAN) tablet 4 mg, 4 mg, Oral, Q6H PRN **OR** ondansetron (ZOFRAN) injection 4 mg, 4 mg, Intravenous, Q6H PRN, Vance Gather B, MD .  rosuvastatin (CRESTOR) tablet 5 mg, 5 mg, Oral, QHS, Patrecia Pour, MD, 5 mg at 01/21/18 2143 .  sodium chloride flush (NS) 0.9 % injection 3 mL, 3 mL, Intravenous, Q12H, Vance Gather B, MD, 3 mL at 01/22/18 1001 Labs CBC    Component Value Date/Time   WBC 6.8 01/21/2018 1229  RBC 5.21 (H) 01/21/2018 1229   HGB 15.6 (H) 01/21/2018 1229   HGB 14.8 11/26/2016 1550   HCT 48.7 (H) 01/21/2018 1229   HCT 43.5 11/26/2016 1550   PLT 171 01/21/2018 1229   PLT 180 11/26/2016 1550   MCV 93.5 01/21/2018 1229   MCV 91 11/26/2016 1550   MCH 29.9 01/21/2018 1229   MCHC 32.0 01/21/2018 1229   RDW 12.6 01/21/2018 1229   RDW 14.0 11/26/2016 1550   LYMPHSABS 1.6 01/21/2018 1229   LYMPHSABS 2.2 11/26/2016 1550    MONOABS 0.5 01/21/2018 1229   EOSABS 0.1 01/21/2018 1229   EOSABS 0.2 11/26/2016 1550   BASOSABS 0.0 01/21/2018 1229   BASOSABS 0.0 11/26/2016 1550    CMP     Component Value Date/Time   NA 141 01/22/2018 0418   NA 142 11/26/2016 1550   K 3.2 (L) 01/22/2018 0418   CL 104 01/22/2018 0418   CO2 27 01/22/2018 0418   GLUCOSE 218 (H) 01/22/2018 0418   BUN 14 01/22/2018 0418   BUN 15 11/26/2016 1550   CREATININE 0.68 01/22/2018 0418   CREATININE 0.77 10/17/2015 1824   CALCIUM 8.6 (L) 01/22/2018 0418   PROT 5.9 (L) 01/21/2018 1229   ALBUMIN 3.5 01/21/2018 1229   AST 15 01/21/2018 1229   ALT 17 01/21/2018 1229   ALKPHOS 71 01/21/2018 1229   BILITOT 1.0 01/21/2018 1229   GFRNONAA >60 01/22/2018 0418   GFRAA >60 01/22/2018 0418    glycosylated hemoglobin  Lipid Panel     Component Value Date/Time   CHOL 114 12/31/2017 0233   TRIG 114 12/31/2017 0233   HDL 46 12/31/2017 0233   CHOLHDL 2.5 12/31/2017 0233   VLDL 23 12/31/2017 0233   LDLCALC 45 12/31/2017 0233     Imaging I have reviewed images in epic and the results pertinent to this consultation are: MRI examination of the brain with and without contrast completed.  MRI of the brain has accelerated progression of white matter disease which is concerning for demyelinating versus stroke.  The contrast-enhanced imaging shows faint enhancement of the new lesion that showed restricted diffusion in the right periventricular white matter. I have discussed the imaging results with multiple neuroradiologist as well as 2 other neurologists who all concur that this is most likely small vessel disease in the setting of her multiple risk factors.  A vasculitic etiology should also be pursued. MRI C-spine shows some myelomalacia at C5-6 level from chronic compressive myelopathy stable from prior scans.  No enhancement.  Does not look like demyelinating lesion.  Assessment:  67 year old woman presenting for generalized weakness and  incontinence. Previous visits have been for multiple strokes/TIAs with some concern on the brain MRI for the accelerated white matter changes being indicative of a demyelinating process rather than small vessel disease. Postcontrast imaging revealed faint enhancement of the new right periventricular lesion without it being grossly evident on the FLAIR/T2 images favoring more of stroke etiology. Also, multiplicity of risk factors and advanced age favor more of accelerated small vessel disease versus a vasculitic process then demyelination. In either case, an outpatient LP would be helpful.  Currently LP cannot be performed because she is on Eliquis.  Impression: Possible new stroke-stroke work-up recently completed no need to repeat. Paroxysmal atrial fibrillation-on Eliquis Coronary artery disease Hypertension Tobacco abuse   Recommendations: -From a neurological standpoint, I have ordered some labs including lupus panel, vasculitic panel and rheumatoid factor. -Strict risk factor modification including smoking cessation, blood pressure control  and diabetes control. -I will refer her back to Dr. Leonie Man in the stroke clinic at [facility] neurology for follow-up on the test results. -Also, at that time decision can be made to have a spinal tap performed.  She will have to go from Eliquis at the time. -CSF analysis especially for oligoclonal bands and IgG index might help guide the diagnosis more towards demyelinating disease if the suspicion still exists.  For now, consensus is that this is accelerated small vessel disease in the presence of multiple cerebrovascular risk factors. -For now, it is okay to continue her Eliquis and aspirin as the stroke is not big and duration of symptoms is not certain, so the risk for hemorrhagic transformation might not be high.  -- Amie Portland, MD [facility] Pager: [phone] If 7pm to 7am, please call on call as listed on AMION.

## 2018-01-22 NOTE — Evaluation (Signed)
Physical Therapy Evaluation Patient Details Name: Melanie Lucas MRN: [id number] DOB: 02/05/1951 Today's Date: 01/22/2018   History of Present Illness  Pt is a 67 y/o female admitted secondary to weakness. MRI revealed Numerous white matter lesions in periventricular white matter, subcortical white matter, pons, brachium pontis, right anterior genu of corpus callosum, right posterior limb of internal capsule, and right thalamus. Lesions with reduced diffusion in the right posterior frontal periventricular white matter and cortex demonstrate enhancement. PMH including but not limited to HTN, HLD, T2DM, COPD, lymphedema, TIA/CVA, left carotid stenosis s/p CEA, CAD, PAF on eliquis, chronic HFpEF, tobacco use, depression and anxiety.    Clinical Impression  Pt presented supine in bed with HOB elevated, awake and willing to participate in therapy session. Prior to admission, pt reported that she was independent with all functional mobility and ADLs. Pt lives with her husband who is currently at a SNF for rehab but is expected to d/c home this week per family report. Pt currently able to perform bed mobility with supervision, transfers with min guard and ambulate in hallway without an AD or UE supports with min guard for safety. Pt also participated in stair training this session without difficulties. Pt would continue to benefit from skilled physical therapy services at this time while admitted and after d/c to address the below listed limitations in order to improve overall safety and independence with functional mobility.     Follow Up Recommendations Home health PT    Equipment Recommendations  None recommended by PT    Recommendations for Other Services       Precautions / Restrictions Precautions Precautions: Fall Restrictions Weight Bearing Restrictions: No      Mobility  Bed Mobility Overal bed mobility: Needs Assistance Bed Mobility: Supine to Sit     Supine to sit: Supervision      General bed mobility comments: supervision for safety  Transfers Overall transfer level: Needs assistance Equipment used: None Transfers: Sit to/from Stand Sit to Stand: Min guard         General transfer comment: min guard for safety with transition into standing from EOB  Ambulation/Gait Ambulation/Gait assistance: Min guard Gait Distance (Feet): 100 Feet Assistive device: None Gait Pattern/deviations: Step-through pattern;Decreased stride length Gait velocity: Decreased    General Gait Details: pt generally steady without use of an AD, min guard for safety but no overt LOB or need for physical assistance  Stairs Stairs: Yes Stairs assistance: Supervision Stair Management: Two rails;Alternating pattern;Forwards Number of Stairs: 2 General stair comments: no difficulties, supervision for safety  Wheelchair Mobility    Modified Rankin (Stroke Patients Only)       Balance Overall balance assessment: Needs assistance Sitting-balance support: No upper extremity supported;Feet supported Sitting balance-Leahy Scale: Good     Standing balance support: During functional activity;No upper extremity supported Standing balance-Leahy Scale: Fair                               Pertinent Vitals/Pain Pain Assessment: No/denies pain    Home Living Family/patient expects to be discharged to:: Private residence Living Arrangements: Spouse/significant other;Other (Comment)(pt's husband currently at a SNF ) Available Help at Discharge: Family;Friend(s);Available 24 hours/day Type of Home: House Home Access: Stairs to enter Entrance Stairs-Rails: Psychiatric nurse of Steps: 3 Home Layout: Able to live on main level with bedroom/bathroom Home Equipment: Walker - 2 wheels;Kasandra Knudsen - single point      Prior Function  Level of Independence: Independent               Hand Dominance        Extremity/Trunk Assessment   Upper Extremity  Assessment Upper Extremity Assessment: Overall WFL for tasks assessed    Lower Extremity Assessment Lower Extremity Assessment: Overall WFL for tasks assessed    Cervical / Trunk Assessment Cervical / Trunk Assessment: Normal  Communication   Communication: No difficulties  Cognition Arousal/Alertness: Awake/alert Behavior During Therapy: WFL for tasks assessed/performed Overall Cognitive Status: Within Functional Limits for tasks assessed                                 General Comments: cognition not formally assessed but [facility] for general conversation      General Comments      Exercises     Assessment/Plan    PT Assessment Patient needs continued PT services  PT Problem List Decreased balance;Decreased mobility;Decreased coordination       PT Treatment Interventions DME instruction;Gait training;Stair training;Functional mobility training;Therapeutic activities;Therapeutic exercise;Balance training;Neuromuscular re-education;Patient/family education    PT Goals (Current goals can be found in the Care Plan section)  Acute Rehab PT Goals Patient Stated Goal: to return home ASAP PT Goal Formulation: With patient/family Time For Goal Achievement: 02/05/18 Potential to Achieve Goals: Good    Frequency Min 3X/week   Barriers to discharge        Co-evaluation               AM-PAC PT "6 Clicks" Mobility  Outcome Measure Help needed turning from your back to your side while in a flat bed without using bedrails?: None Help needed moving from lying on your back to sitting on the side of a flat bed without using bedrails?: None Help needed moving to and from a bed to a chair (including a wheelchair)?: None Help needed standing up from a chair using your arms (e.g., wheelchair or bedside chair)?: None Help needed to walk in hospital room?: A Little Help needed climbing 3-5 steps with a railing? : A Little 6 Click Score: 22    End of Session  Equipment Utilized During Treatment: Gait belt Activity Tolerance: Patient tolerated treatment well Patient left: with call bell/phone within reach;with family/visitor present;Other (comment)(seated EOB) Nurse Communication: Mobility status PT Visit Diagnosis: Other abnormalities of gait and mobility (R26.89)    Time: 8453-6468 PT Time Calculation (min) (ACUTE ONLY): 12 min   Charges:   PT Evaluation $PT Eval Low Complexity: [place], PT, DPT  Acute Rehabilitation Services Pager [phone] Office [place] 01/22/2018, 2:00 PM

## 2018-01-22 NOTE — Discharge Summary (Signed)
Physician Discharge Summary  Melanie Lucas IFO:[id number] DOB: 06/05/1950 DOA: 01/21/2018  PCP: Dineen Kid, MD  Admit date: 01/21/2018 Discharge date: 01/22/2018  Time spent: 40 minutes  Recommendations for Outpatient Follow-up:  1. Follow up with dr Leonie Man [facility] 3 weeks for OP follow up and results of vasculitis work up 2. Follow up with PCP 1-2 weeks for evaluation of diabetes control   Discharge Diagnoses:  Principal Problem:   Stroke-like episode ([place]) Active Problems:   Paroxysmal atrial fibrillation ([facility])   HLD (hyperlipidemia)   Essential hypertension, benign   CAD (coronary artery disease)   Diabetes mellitus without complication ([place])   COPD (chronic obstructive pulmonary disease) ([place])   Depression   TOBACCO ABUSE   Discharge Condition: stable  Diet recommendation: heart healthy carb modified  There were no vitals filed for this visit.  History of present illness:  Melanie Lucas is a 67 y.o. female with a history of HTN, HLD, T2DM, COPD, lymphedema, TIA/CVA, left carotid stenosis s/p CEA, CAD, PAF on eliquis, chronic HFpEF, tobacco use, depression and anxiety who presented to the ED on 01/21/18 with weakness. She reported awakening in her normal state of health  going to the kitchen to make coffee and having an abrupt sensation of inability to move legs which was severe and constant, lasting several minutes. She sat down and felt fecal urgency so went to the bathroom but had incontinence. She felt this was similar to prior TIA experiences she's had so she called EMS but had difficulty saying the words she wanted to say. EMS brought her to the ED where symptoms have been resolved. CT of the head showed no acute changes and MRI without contrast demonstrated new findings including scattered bihemispheric foci. EDP spoke with neurology who recommended admission for MRI w/contrast of the brain, and MRI w/o and w/contrast of the cervical spine due to concern for demyelinating disease.    Hospital Course:  Stroke-like episode, concern for recurrent stroke vs. demyelinating disease: Recently had full stroke work up which will not need to be repeated. Evaluated by neuro who opines some concern on MRI brain for the accelerated white matter changes bieng indicative of a demyelinating process rather than small vessel disease and notes that post contrast imaging revealed faint enhancement of the new right periventricular lesion without it being grossly evident on images favoring more a stroke etiology. Also recommend OP LP as well as OP follow up on vasculitis workup. Neuro note indicates CSF analysis especially for oligoclonal bands and IgG index might help guide the diagnosis more toward demyelinating disease if suspicion still exists. At time of discharge neuro opines that this is accelerated small vessel diease in presence of multiple cerebrovascular risk factor. Ok to continue eliquis  Tobacco use:  - Nicotine patch, cessation counseling to be provided prior to discharge.   Paroxysmal AFib:  - Continue eliquis  - Continue tikosyn, maintain K >4 and Mg >2, QTc interval on ECG was 456 msec  CAD: Neg troponin, no ischemic ECG changes and no chest pain.    Hyperlipidemia:  - Continue crestor, note other statin allergies.  T2DM: HbA1c recently 9.3%. Needs better control.   Lymphedema: Dx at early age, stable.  - No indication for lasix at this time.   COPD: No exacerbation  Depression:  - Continue duloxetine   Procedures:    Consultations:  Dr Rory Percy neurology  Discharge Exam: Vitals:   01/22/18 0746 01/22/18 1100  BP: (!) 166/63 (!) 169/81  Pulse: [name])  53   Resp: 17 16  Temp: 98.1 F (36.7 C) 98 F (36.7 C)  SpO2: 96% 98%    General: well nourished alert no acute distress Cardiovascular: rrr no mgr no LE edema Respiratory: normal effort BS clear bilaterally no wheeze  Discharge Instructions   Discharge Instructions    Ambulatory referral to  Neurology   Complete by:  As directed    An appointment is requested in approximately: 3 weeks dr Leonie Man established patient needs follow up   Call MD for:  difficulty breathing, headache or visual disturbances   Complete by:  As directed    Call MD for:  persistant dizziness or light-headedness   Complete by:  As directed    Call MD for:  redness, tenderness, or signs of infection (pain, swelling, redness, odor or green/yellow discharge around incision site)   Complete by:  As directed    Diet - low sodium heart healthy   Complete by:  As directed    Discharge instructions   Complete by:  As directed    Follow up with Dr Leonie Man clinic for results of vasculitis work up and discussion regarding need for OP LP.   Increase activity slowly   Complete by:  As directed      Allergies as of 01/22/2018      Reactions   Avelox [moxifloxacin]    Unknown reaction    Chantix [varenicline]    nightmares   Fosamax [alendronate]    Pain all over   Pradaxa [dabigatran Etexilate Mesylate]    Extreme bleeding   Statins Nausea And Vomiting, Other (See Comments)   Muscle pain, Dizziness (intolerance)   Sulfamethoxazole Nausea And Vomiting   Dizziness (intolerance)   Sulfonamide Derivatives Hives   Nausea vertigo   Ciprofloxacin Rash   Gabapentin Rash   burning   Tape Itching, Rash   Please use "paper" tape only      Medication List    TAKE these medications   albuterol 108 (90 Base) MCG/ACT inhaler Commonly known as:  PROVENTIL HFA;VENTOLIN HFA Inhale 2 puffs into the lungs every 6 (six) hours as needed for wheezing or shortness of breath.   aspirin EC 81 MG tablet Take 1 tablet (81 mg total) by mouth daily.   dofetilide 500 MCG capsule Commonly known as:  TIKOSYN TAKE 1 CAPSULE (500 MCG TOTAL) 2 (TWO) TIMES DAILY BY MOUTH.   DULOXETINE HCL PO Take 40 mg by mouth daily.   ELIQUIS 5 MG Tabs tablet Generic drug:  apixaban TAKE 1 TABLET (5 MG TOTAL) BY MOUTH 2 (TWO) TIMES DAILY.    furosemide 20 MG tablet Commonly known as:  LASIX Take 20 mg by mouth as needed for edema.   ibuprofen 800 MG tablet Commonly known as:  ADVIL,MOTRIN Take 800 mg by mouth 3 (three) times daily as needed for mild pain.   multivitamin tablet Take 1 tablet by mouth at bedtime.   nicotine 14 mg/24hr patch Commonly known as:  NICODERM CQ - dosed in mg/24 hours Place 1 patch (14 mg total) onto the skin daily.   rosuvastatin 5 MG tablet Commonly known as:  CRESTOR Take 5 mg by mouth at bedtime.   SYNJARDY 12.06-998 MG Tabs Generic drug:  Empagliflozin-metFORMIN HCl Take 1 tablet by mouth 2 (two) times daily.      Allergies  Allergen Reactions  . Avelox [Moxifloxacin]     Unknown reaction   . Chantix [Varenicline]     nightmares  . Fosamax [Alendronate]  Pain all over  . Pradaxa [Dabigatran Etexilate Mesylate]     Extreme bleeding  . Statins Nausea And Vomiting and Other (See Comments)    Muscle pain, Dizziness (intolerance)  . Sulfamethoxazole Nausea And Vomiting    Dizziness (intolerance)  . Sulfonamide Derivatives Hives    Nausea vertigo  . Ciprofloxacin Rash  . Gabapentin Rash    burning  . Tape Itching and Rash    Please use "paper" tape only   Follow-up Information    [facility]. Schedule an appointment as soon as possible for a visit in 3 days.   Contact information: [address]     [place] [id number] [phone]       Via, Lennette Bihari, MD. Schedule an appointment as soon as possible for a visit in 2 days.   Specialty:  Family Medicine Contact information: [place] [address] [phone]            The results of significant diagnostics from this hospitalization (including imaging, microbiology, ancillary and laboratory) are listed below for reference.    Significant Diagnostic Studies: Ct Angio Head W Or Wo Contrast  Result Date: 12/31/2017 CLINICAL DATA:  Initial  evaluation for acute stroke. EXAM: CT ANGIOGRAPHY HEAD AND NECK TECHNIQUE: Multidetector CT imaging of the head and neck was performed using the standard protocol during bolus administration of intravenous contrast. Multiplanar CT image reconstructions and MIPs were obtained to evaluate the vascular anatomy. Carotid stenosis measurements (when applicable) are obtained utilizing NASCET criteria, using the distal internal carotid diameter as the denominator. CONTRAST:  27mL ISOVUE-370 IOPAMIDOL (ISOVUE-370) INJECTION 76% COMPARISON:  Previous MRI from earlier the same day as well as previous CTA from 06/23/2017. FINDINGS: CT HEAD FINDINGS Brain: Atrophy with moderate chronic small vessel ischemic disease again noted. Subtle vaulting ischemic infarct at the posterior right corona radiata grossly stable from previous MRI. No evidence for associated hemorrhage. No acute intracranial hemorrhage. No other acute large vessel territory infarct. No mass lesion, midline shift or mass effect. No hydrocephalus. No extra-axial fluid collection. Vascular: No hyperdense vessel. Scattered vascular calcifications noted within the carotid siphons. Skull: Scalp soft tissues and calvarium within normal limits. Sinuses: Mild scattered mucosal thickening within the ethmoidal air cells. Visualized paranasal sinuses are otherwise clear. No mastoid effusion. Orbits: Globes orbital soft tissues demonstrate no acute finding. CTA NECK FINDINGS Aortic arch: Visualized aortic arch of normal caliber with normal 3 vessel morphology. Moderate atheromatous plaque within the visualize arch and about the origin of the great vessels without hemodynamically significant stenosis. Visualized subclavian arteries widely patent. Prominent coronary artery calcifications partially visualized. Right carotid system: Right common carotid artery patent from its origin to the bifurcation without stenosis. A centric calcified plaque at the proximal right ICA with  associated stenosis of up to 50% by NASCET criteria, similar to previous. Right ICA tortuous but otherwise widely patent distally to the skull base without stenosis, dissection, or occlusion. Left carotid system: Left common carotid artery patent from its origin to the bifurcation without stenosis. Sequelae of prior endarterectomy noted at the left bifurcation/proximal left ICA. No significant residual or recurrent stenosis about the endarterectomy site. Left ICA widely patent distally to the skull base without stenosis, dissection, or occlusion. Vertebral arteries: Both of the vertebral arteries arise from the subclavian arteries. Vertebral arteries widely patent within the neck without stenosis, dissection, or occlusion. Skeleton: No acute osseus abnormality. Mild wedging of the superior endplate of T3 grossly stable from previous.  No discrete lytic or blastic osseous lesions. Prior ACDF at C5 through C7 with C6 corpectomy. Patient is edentulous. Other neck: No acute soft tissue abnormality within the neck. Salivary glands normal. Few scattered subcentimeter hypodense thyroid nodules noted, of doubtful significance given size and patient age. No adenopathy. Upper chest: Visualized upper chest demonstrates no acute finding. Cardiomegaly with coronary artery calcifications partially visualized. Partially visualized lungs are grossly clear. Review of the MIP images confirms the above findings CTA HEAD FINDINGS Anterior circulation: Mild scattered plaque within the petrous ICAs bilaterally without significant narrowing on the right. Similar mild stenosis on the left. Scattered atheromatous plaque throughout the cavernous/supraclinoid ICAs with resultant mild multifocal narrowing, also stable from previous. ICA termini widely patent. A1 segments well perfused. Normal anterior communicating artery. Anterior cerebral arteries widely patent to their distal aspects without stenosis. M1 segments widely patent bilaterally.  Normal MCA bifurcations. No proximal M2 occlusion. Distal MCA branches well perfused and symmetric. Posterior circulation: Vertebral arteries widely patent to the vertebrobasilar junction without stenosis. Patent right PICA. Left PICA not visualized. Dominant left AICA. Basilar widely patent to its distal aspect. Superior cerebral arteries patent bilaterally. Both of the posterior cerebral arteries primarily supplied via the basilar. Mild atheromatous irregularity within the PCAs bilaterally without high-grade stenosis. PCAs are patent to their distal aspects. Small right posterior communicating artery noted. Venous sinuses: Grossly patent, although not well assessed due to arterial timing the contrast bolus. Anatomic variants: None significant. Delayed phase: No abnormal enhancement. Review of the MIP images confirms the above findings IMPRESSION: 1. Negative CTA for emergent large vessel occlusion. 2. Postoperative changes from interval left carotid endarterectomy without adverse features. No significant residual or recurrent stenosis identified. 3. Atheromatous stenosis of approximately 50% at the right carotid bifurcation, stable. 4. Moderate atherosclerotic change throughout the carotid siphons without high-grade stenosis, similar to previous. No other high-grade or correctable stenosis identified. Electronically Signed   By: Jeannine Boga M.D.   On: 12/31/2017 00:33   Dg Chest 2 View  Result Date: 01/10/2018 CLINICAL DATA:  Hyperglycemia today. Shortness of breath. History of COPD, diabetes, hypertension, current smoker. EXAM: CHEST - 2 VIEW COMPARISON:  06/23/2017 FINDINGS: Normal heart size and pulmonary vascularity. No focal airspace disease or consolidation in the lungs. No blunting of costophrenic angles. No pneumothorax. Mediastinal contours appear intact. Postoperative changes in the cervical spine. Thoracolumbar scoliosis convex towards the left. IMPRESSION: No active cardiopulmonary  disease. Electronically Signed   By: Lucienne Capers M.D.   On: 01/10/2018 23:04   Ct Head Wo Contrast  Result Date: 01/21/2018 CLINICAL DATA:  Sudden onset of generalized weakness. Aphasia. Incontinence. EXAM: CT HEAD WITHOUT CONTRAST TECHNIQUE: Contiguous axial images were obtained from the base of the skull through the vertex without intravenous contrast. COMPARISON:  December 30, 2017 FINDINGS: Brain: No subdural, epidural, or subarachnoid hemorrhage. Scattered white matter changes and a small right thalamic lacunar infarct are stable. No acute cortical ischemia or infarct. No mass effect or midline shift. Ventricles and sulci are prominent but stable. Cerebellum, brainstem, and basal cisterns are normal. Vascular: Calcified atherosclerosis in the intracranial carotids. Skull: Normal. Negative for fracture or focal lesion. Sinuses/Orbits: Mucosal thickening in scattered ethmoid air cells. Sinuses, mastoid air cells, and middle ears are otherwise well aerated. Other: None. IMPRESSION: 1. Chronic white matter changes. Small stable right thalamic lacunar infarct. No acute intracranial abnormalities noted. 2. No other acute abnormalities are identified. Electronically Signed   By: Dorise Bullion III M.D   On: 01/21/2018  13:32   Ct Angio Neck W Or Wo Contrast  Result Date: 12/31/2017 CLINICAL DATA:  Initial evaluation for acute stroke. EXAM: CT ANGIOGRAPHY HEAD AND NECK TECHNIQUE: Multidetector CT imaging of the head and neck was performed using the standard protocol during bolus administration of intravenous contrast. Multiplanar CT image reconstructions and MIPs were obtained to evaluate the vascular anatomy. Carotid stenosis measurements (when applicable) are obtained utilizing NASCET criteria, using the distal internal carotid diameter as the denominator. CONTRAST:  27mL ISOVUE-370 IOPAMIDOL (ISOVUE-370) INJECTION 76% COMPARISON:  Previous MRI from earlier the same day as well as previous CTA from  06/23/2017. FINDINGS: CT HEAD FINDINGS Brain: Atrophy with moderate chronic small vessel ischemic disease again noted. Subtle vaulting ischemic infarct at the posterior right corona radiata grossly stable from previous MRI. No evidence for associated hemorrhage. No acute intracranial hemorrhage. No other acute large vessel territory infarct. No mass lesion, midline shift or mass effect. No hydrocephalus. No extra-axial fluid collection. Vascular: No hyperdense vessel. Scattered vascular calcifications noted within the carotid siphons. Skull: Scalp soft tissues and calvarium within normal limits. Sinuses: Mild scattered mucosal thickening within the ethmoidal air cells. Visualized paranasal sinuses are otherwise clear. No mastoid effusion. Orbits: Globes orbital soft tissues demonstrate no acute finding. CTA NECK FINDINGS Aortic arch: Visualized aortic arch of normal caliber with normal 3 vessel morphology. Moderate atheromatous plaque within the visualize arch and about the origin of the great vessels without hemodynamically significant stenosis. Visualized subclavian arteries widely patent. Prominent coronary artery calcifications partially visualized. Right carotid system: Right common carotid artery patent from its origin to the bifurcation without stenosis. A centric calcified plaque at the proximal right ICA with associated stenosis of up to 50% by NASCET criteria, similar to previous. Right ICA tortuous but otherwise widely patent distally to the skull base without stenosis, dissection, or occlusion. Left carotid system: Left common carotid artery patent from its origin to the bifurcation without stenosis. Sequelae of prior endarterectomy noted at the left bifurcation/proximal left ICA. No significant residual or recurrent stenosis about the endarterectomy site. Left ICA widely patent distally to the skull base without stenosis, dissection, or occlusion. Vertebral arteries: Both of the vertebral arteries arise  from the subclavian arteries. Vertebral arteries widely patent within the neck without stenosis, dissection, or occlusion. Skeleton: No acute osseus abnormality. Mild wedging of the superior endplate of T3 grossly stable from previous. No discrete lytic or blastic osseous lesions. Prior ACDF at C5 through C7 with C6 corpectomy. Patient is edentulous. Other neck: No acute soft tissue abnormality within the neck. Salivary glands normal. Few scattered subcentimeter hypodense thyroid nodules noted, of doubtful significance given size and patient age. No adenopathy. Upper chest: Visualized upper chest demonstrates no acute finding. Cardiomegaly with coronary artery calcifications partially visualized. Partially visualized lungs are grossly clear. Review of the MIP images confirms the above findings CTA HEAD FINDINGS Anterior circulation: Mild scattered plaque within the petrous ICAs bilaterally without significant narrowing on the right. Similar mild stenosis on the left. Scattered atheromatous plaque throughout the cavernous/supraclinoid ICAs with resultant mild multifocal narrowing, also stable from previous. ICA termini widely patent. A1 segments well perfused. Normal anterior communicating artery. Anterior cerebral arteries widely patent to their distal aspects without stenosis. M1 segments widely patent bilaterally. Normal MCA bifurcations. No proximal M2 occlusion. Distal MCA branches well perfused and symmetric. Posterior circulation: Vertebral arteries widely patent to the vertebrobasilar junction without stenosis. Patent right PICA. Left PICA not visualized. Dominant left AICA. Basilar widely patent to its distal  aspect. Superior cerebral arteries patent bilaterally. Both of the posterior cerebral arteries primarily supplied via the basilar. Mild atheromatous irregularity within the PCAs bilaterally without high-grade stenosis. PCAs are patent to their distal aspects. Small right posterior communicating artery  noted. Venous sinuses: Grossly patent, although not well assessed due to arterial timing the contrast bolus. Anatomic variants: None significant. Delayed phase: No abnormal enhancement. Review of the MIP images confirms the above findings IMPRESSION: 1. Negative CTA for emergent large vessel occlusion. 2. Postoperative changes from interval left carotid endarterectomy without adverse features. No significant residual or recurrent stenosis identified. 3. Atheromatous stenosis of approximately 50% at the right carotid bifurcation, stable. 4. Moderate atherosclerotic change throughout the carotid siphons without high-grade stenosis, similar to previous. No other high-grade or correctable stenosis identified. Electronically Signed   By: Jeannine Boga M.D.   On: 12/31/2017 00:33   Mr Brain Wo Contrast (neuro Protocol)  Result Date: 01/21/2018 CLINICAL DATA:  Sudden onset of weakness beginning 1050 hours today. EXAM: MRI HEAD WITHOUT CONTRAST TECHNIQUE: Multiplanar, multiecho pulse sequences of the brain and surrounding structures were obtained without intravenous contrast. COMPARISON:  CT earlier same day. MRI 12/30/2017. multiple previous studies as distant as 10/12/2014 FINDINGS: Brain: Today's examination is becoming more worrisome for demyelinating disease as an underlying diagnosis as compared with chronic small-vessel ischemic change. There are several scattered foci showing restricted diffusion within the cerebral hemispheric white matter, but coming larger and more conspicuous in comparison to the study of 12/30/2017. the largest is in the white matter adjacent to the posterior body of the right lateral ventricle but there are several other smaller foci scattered within both hemispheres. Elsewhere, the brain shows extensive patchy involvement of the pons, possibly with some involvement of the middle cerebellar peduncles. Extensive patchy involvement elsewhere throughout the cerebral hemispheric deep  and subcortical white matter. Scattered small vessel infarctions in the right thalamus which are old. No large vessel territory infarction. No mass lesion, hemorrhage, hydrocephalus or extra-axial collection. Vascular: Major vessels at the base of the brain show flow. Skull and upper cervical spine: Negative Sinuses/Orbits: Clear/normal Other: None IMPRESSION: Scattered foci of restricted diffusion within the cerebral hemispheric white matter bilaterally, progressive and more conspicuous than on the study 12/30/2017. The pattern is suggestive of active demyelinating disease/multiple sclerosis. The differential diagnosis does include small-vessel ischemic changes and changes related to vasculitis. I think the patient probably does have at least a degree of ordinary small vessel ischemic change in addition, but I am beginning to doubt if that is the dominant or primary process in this case. Electronically Signed   By: Nelson Chimes M.D.   On: 01/21/2018 16:09   Mr Brain Wo Contrast (neuro Protocol)  Result Date: 12/30/2017 CLINICAL DATA:  Gait difficulty. Headache. Intermittent confusion for 3 days. EXAM: MRI HEAD WITHOUT CONTRAST TECHNIQUE: Multiplanar, multiecho pulse sequences of the brain and surrounding structures were obtained without intravenous contrast. COMPARISON:  Head CT 11/29/2017 and MRI 06/23/2017 FINDINGS: Brain: A 1.3 cm region of restricted diffusion involving the posterior right corona radiata is consistent with an acute lateral lenticulostriate territory infarct. Scattered small areas of mild T2 shine through are noted on diffusion imaging elsewhere in both cerebral hemispheres. One or two foci of chronic microhemorrhage are noted in the right frontal and right parietal lobes. Patchy chronic T2 hyperintensities in the cerebral white matter and pons are similar to the prior MRI and are nonspecific but compatible with moderate chronic small vessel ischemic disease, advanced for age. Chronic  lacunar infarcts are again seen in the cerebral white matter bilaterally, right thalamus, and pons. There is moderate cerebral atrophy. There is no midline shift or extra-axial fluid collection. Vascular: Major intracranial vascular flow voids are preserved. Skull and upper cervical spine: Unremarkable bone marrow signal. Sinuses/Orbits: Unremarkable orbits. Clear paranasal sinuses. Trace bilateral mastoid effusions. Other: None. IMPRESSION: 1. Acute right corona radiata infarct. 2. Moderate chronic small vessel ischemic disease. Electronically Signed   By: Logan Bores M.D.   On: 12/30/2017 18:45   Mr Brain W Contrast  Result Date: 01/21/2018 CLINICAL DATA:  67 y/o F; abnormal MRI of the brain. Evaluation of multiple sclerosis. EXAM: MRI HEAD WITH CONTRAST MRI CERVICAL SPINE WITHOUT AND WITH CONTRAST TECHNIQUE: Multiplanar, multiecho pulse sequences of the cervical spine, to include the craniocervical junction and cervicothoracic junction, were obtained without and with intravenous contrast. Multiplanar multi echo MRI of the brain was acquired with T2 FLAIR space, axial T1 post, coronal T1 post, sagittal T1 postcontrast sequences. CONTRAST:  7 cc Gadavist. COMPARISON:  01/21/2018 MRI of the head. 12/30/2017 MRI head. 06/23/2017 MRI head. 04/23/2017 and 02/15/2016 MRI cervical spine. FINDINGS: MRI HEAD FINDINGS Brain: Volumetric T2 FLAIR weighted sequence with fat suppression demonstrates numerous white matter lesions throughout subcortical and periventricular white matter of the supratentorial brain. Additionally, there is signal abnormality within the right anterior genu of corpus callosum, right posterior limb of internal capsule and the right thalamus as well as central pons extending into the bilateral brachium pontis. No juxta cortical lesion identified. After administration of intravenous contrast there is enhancement of lesions demonstrating reduced diffusion in the right posterior frontal  periventricular white matter and cortex (series 12 image 18 and 21). No mass effect, extra-axial collection, hydrocephalus, or herniation. MRI CERVICAL SPINE FINDINGS Alignment: Straightening of cervical lordosis. No listhesis. Mild leftward curvature with apex at C6. Vertebrae: C5-C7 ACDF. Susceptibility artifact from fusion hardware partially obscures the vertebral bodies. No evidence for discitis or osteomyelitis. No fracture. Mild right-sided C3-4 facet edema, likely degenerative. Cord: Stable bilateral central cord signal with cord volume loss extending from from C5-C6. No additional cord lesion. No abnormal enhancement of the spinal cord. Posterior Fossa, vertebral arteries, paraspinal tissues: Negative. Disc levels: C2-3: No significant disc displacement, foraminal stenosis, or canal stenosis. C3-4: Stable right-sided uncovertebral hypertrophy and facet hypertrophy with mild-to-moderate right foraminal stenosis. No canal stenosis. C4-5: Stable disc osteophyte complex with right greater than left uncovertebral and facet hypertrophy. Severe right foraminal stenosis and mild canal stenosis. No left foraminal stenosis. C5-6: ACDF. Right greater than left uncovertebral and facet hypertrophy is stable with moderate right and mild left foraminal stenosis. Posterior ossific ridging of the vertebral bodies with residual mild spinal canal stenosis. C6-7: ACDF. Stable right central disc protrusion with endplate marginal osteophytes as well as predominant right-sided uncovertebral and facet hypertrophy. Moderate spinal canal stenosis with disc contact on the right anterior cord and cord flattening. Moderate right foraminal stenosis. No left foraminal stenosis. C7-T1: Stable small central disc protrusion with mild spinal canal stenosis. No significant foraminal stenosis. IMPRESSION: MRI head: 1. Numerous white matter lesions in periventricular white matter, subcortical white matter, pons, brachium pontis, right anterior  genu of corpus callosum, right posterior limb of internal capsule, and right thalamus. 2. Lesions with reduced diffusion in the right posterior frontal periventricular white matter and cortex demonstrate enhancement. 3. Differential includes demyelination as described on same-day MRI of the head or enhancing infarctions in the setting of advanced chronic microvascular ischemic changes. MRI cervical spine:  1. Stable postsurgical changes related to C5-C7 ACDF. No acute osseous abnormality. 2. Stable increased cord signal with volume loss at C5-C6, probably chronic compressive myelopathy. 3. No new cord lesion or enhancement to suggest an active process. 4. Stable advanced cervical spondylosis with multilevel foraminal and canal stenosis as above. Electronically Signed   By: Kristine Garbe M.D.   On: 01/21/2018 20:18   Mr Cervical Spine W Wo Contrast  Result Date: 01/21/2018 CLINICAL DATA:  67 y/o F; abnormal MRI of the brain. Evaluation of multiple sclerosis. EXAM: MRI HEAD WITH CONTRAST MRI CERVICAL SPINE WITHOUT AND WITH CONTRAST TECHNIQUE: Multiplanar, multiecho pulse sequences of the cervical spine, to include the craniocervical junction and cervicothoracic junction, were obtained without and with intravenous contrast. Multiplanar multi echo MRI of the brain was acquired with T2 FLAIR space, axial T1 post, coronal T1 post, sagittal T1 postcontrast sequences. CONTRAST:  7 cc Gadavist. COMPARISON:  01/21/2018 MRI of the head. 12/30/2017 MRI head. 06/23/2017 MRI head. 04/23/2017 and 02/15/2016 MRI cervical spine. FINDINGS: MRI HEAD FINDINGS Brain: Volumetric T2 FLAIR weighted sequence with fat suppression demonstrates numerous white matter lesions throughout subcortical and periventricular white matter of the supratentorial brain. Additionally, there is signal abnormality within the right anterior genu of corpus callosum, right posterior limb of internal capsule and the right thalamus as well as central  pons extending into the bilateral brachium pontis. No juxta cortical lesion identified. After administration of intravenous contrast there is enhancement of lesions demonstrating reduced diffusion in the right posterior frontal periventricular white matter and cortex (series 12 image 18 and 21). No mass effect, extra-axial collection, hydrocephalus, or herniation. MRI CERVICAL SPINE FINDINGS Alignment: Straightening of cervical lordosis. No listhesis. Mild leftward curvature with apex at C6. Vertebrae: C5-C7 ACDF. Susceptibility artifact from fusion hardware partially obscures the vertebral bodies. No evidence for discitis or osteomyelitis. No fracture. Mild right-sided C3-4 facet edema, likely degenerative. Cord: Stable bilateral central cord signal with cord volume loss extending from from C5-C6. No additional cord lesion. No abnormal enhancement of the spinal cord. Posterior Fossa, vertebral arteries, paraspinal tissues: Negative. Disc levels: C2-3: No significant disc displacement, foraminal stenosis, or canal stenosis. C3-4: Stable right-sided uncovertebral hypertrophy and facet hypertrophy with mild-to-moderate right foraminal stenosis. No canal stenosis. C4-5: Stable disc osteophyte complex with right greater than left uncovertebral and facet hypertrophy. Severe right foraminal stenosis and mild canal stenosis. No left foraminal stenosis. C5-6: ACDF. Right greater than left uncovertebral and facet hypertrophy is stable with moderate right and mild left foraminal stenosis. Posterior ossific ridging of the vertebral bodies with residual mild spinal canal stenosis. C6-7: ACDF. Stable right central disc protrusion with endplate marginal osteophytes as well as predominant right-sided uncovertebral and facet hypertrophy. Moderate spinal canal stenosis with disc contact on the right anterior cord and cord flattening. Moderate right foraminal stenosis. No left foraminal stenosis. C7-T1: Stable small central disc  protrusion with mild spinal canal stenosis. No significant foraminal stenosis. IMPRESSION: MRI head: 1. Numerous white matter lesions in periventricular white matter, subcortical white matter, pons, brachium pontis, right anterior genu of corpus callosum, right posterior limb of internal capsule, and right thalamus. 2. Lesions with reduced diffusion in the right posterior frontal periventricular white matter and cortex demonstrate enhancement. 3. Differential includes demyelination as described on same-day MRI of the head or enhancing infarctions in the setting of advanced chronic microvascular ischemic changes. MRI cervical spine: 1. Stable postsurgical changes related to C5-C7 ACDF. No acute osseous abnormality. 2. Stable increased cord signal with volume  loss at C5-C6, probably chronic compressive myelopathy. 3. No new cord lesion or enhancement to suggest an active process. 4. Stable advanced cervical spondylosis with multilevel foraminal and canal stenosis as above. Electronically Signed   By: Kristine Garbe M.D.   On: 01/21/2018 20:18    Microbiology: No results found for this or any previous visit (from the past 240 hour(s)).   Labs: Basic Metabolic Panel: Recent Labs  Lab 01/21/18 1229 01/22/18 0418  NA 141 141  K 3.5 3.2*  CL 102 104  CO2 29 27  GLUCOSE 244* 218*  BUN 12 14  CREATININE 0.71 0.68  CALCIUM 8.9 8.6*  MG  --  2.1   Liver Function Tests: Recent Labs  Lab 01/21/18 1229  AST 15  ALT 17  ALKPHOS 71  BILITOT 1.0  PROT 5.9*  ALBUMIN 3.5   No results for input(s): LIPASE, AMYLASE in the last 168 hours. No results for input(s): AMMONIA in the last 168 hours. CBC: Recent Labs  Lab 01/21/18 1229  WBC 6.8  NEUTROABS 4.5  HGB 15.6*  HCT 48.7*  MCV 93.5  PLT 171   Cardiac Enzymes: No results for input(s): CKTOTAL, CKMB, CKMBINDEX, TROPONINI in the last 168 hours. BNP: BNP (last 3 results) No results for input(s): BNP in the last 8760  hours.  ProBNP (last 3 results) No results for input(s): PROBNP in the last 8760 hours.  CBG: Recent Labs  Lab 01/21/18 2113 01/22/18 0621 01/22/18 1201  GLUCAP 337* 194* 196*       Signed:  Radene Gunning NP [facility] 01/22/2018, 2:16 PM

## 2018-01-23 ENCOUNTER — Other Ambulatory Visit: Payer: Self-pay | Admitting: *Deleted

## 2018-01-23 ENCOUNTER — Other Ambulatory Visit: Payer: Self-pay

## 2018-01-23 LAB — ANTI-JO 1 ANTIBODY, IGG: Anti JO-1: <0.2 AI (ref 0.0–0.9)

## 2018-01-23 LAB — HEPATITIS PANEL, ACUTE
HCV Ab: <0.1 s/co ratio (ref 0.0–0.9)
Hep A IgM: NEGATIVE
Hep B C IgM: NEGATIVE
Hepatitis B Surface Ag: NEGATIVE

## 2018-01-23 LAB — EXTRACTABLE NUCLEAR ANTIGEN ANTIBODY
ENA SM Ab Ser-aCnc: <0.2 AI (ref 0.0–0.9)
SSA (Ro) (ENA) Antibody, IgG: <0.2 AI (ref 0.0–0.9)
SSB (La) (ENA) Antibody, IgG: <0.2 AI (ref 0.0–0.9)
Scleroderma (Scl-70) (ENA) Antibody, IgG: 0.4 AI (ref 0.0–0.9)
ds DNA Ab: <1 IU/mL (ref 0–9)

## 2018-01-23 LAB — ANCA TITERS
Atypical P-ANCA titer: <1:20 {titer}
C-ANCA: <1:20 {titer}
P-ANCA: <1:20 {titer}

## 2018-01-23 LAB — C3 COMPLEMENT: C3 Complement: 108 mg/dL (ref 82–167)

## 2018-01-23 LAB — C4 COMPLEMENT: Complement C4, Body Fluid: 27 mg/dL (ref 14–44)

## 2018-01-23 LAB — RHEUMATOID FACTOR: Rheumatoid fact SerPl-aCnc: <10 IU/mL (ref 0.0–13.9)

## 2018-01-23 NOTE — Patient Outreach (Signed)
[facility]) Care Management  01/23/2018  Melanie Lucas 03-13-1950 [id number]    RN attempted outreach call however unsuccessful and unable to leave a voice message. Will rescheduled another outreach call over the next week for pending [facility] services.   Raina Mina, RN Care Management Coordinator [place] Office [phone]

## 2018-01-23 NOTE — Patient Outreach (Signed)
[place] [facility]) Care Management  01/23/2018  Melanie Lucas [birth year] [id number]  Successful outreach to the patient on today's date, HIPAA identifiers confirmed. BSW introduced self to the patient and the reason for today's call, indicating the patient had been referred for assistance with heat. The patient denies concerns with her heat stating it is working properly. The patient does identify that her A/C unit needs a new part in order for A/C to work properly but she is not concerned with heat at this time. The patient denies transportation concerns stating she has a vehicle and is able to drive herself at this time. The patient indicates her husband is scheduled to return home next week from rehab for recent knee replacement. The patient states both she and her husband will be receiving home health services.   The patient has yet to begin services but states "they will call me soon". BSW to perform a discipline closure as no social work needs have been identified at this time. BSW encouraged the patient to notify [facility] RN CM Raina Mina if she has not heard from home health within the next few days. The patient stated understanding. The patient is in agreement with discipline closure and understands she may initiate services with social work if a need is identified. BSW to update [facility] RN CM via in basket message of discipline closure.  Daneen Schick, BSW, CDP [facility] [phone]

## 2018-01-24 ENCOUNTER — Other Ambulatory Visit: Payer: Self-pay | Admitting: *Deleted

## 2018-01-24 ENCOUNTER — Encounter: Payer: Self-pay | Admitting: *Deleted

## 2018-01-24 LAB — MPO/PR-3 (ANCA) ANTIBODIES
ANCA Proteinase 3: <3.5 U/mL (ref 0.0–3.5)
Myeloperoxidase Abs: <9 U/mL (ref 0.0–9.0)

## 2018-01-24 NOTE — Patient Outreach (Signed)
[facility]) Care Management  01/24/2018  Melanie Lucas 1950/12/16 [id number]    RN received a call back from pt as RN reintroduced [facility] and purpose for the ongoing contacts. RN  Also inquired on the EMMI RED related to pt's medications. Pt states she has a prescription for a nicotine patch that she has yet to fill. All other medications are filled and pt states she is taking as prescribed. RN inquired once again on pt's medical issues at this time. Pt states she is managing her COPD and At fib with no encountered problems and acute issues. RN inquired once again on her diabetes with the last reported readings around 350 indicated by pt. Pt states she is taking her medications and managing this condition better with reduced readings now around 250. RN continue to stress these reading are elevated reading and offered a one-on-one consult with a plan of care to reduce these readings through possible changes with hier dietary habits and lifestyle. Pt feels she is doing the best she can with the reduce readings already and opt ot decline this offer. RN also offered telephone disease management however pt opt to also decline. RN inquired on any other issues at this time that may need to be addressed however pt feels she is improving in managing her medical issues and does not needs [facility] services at this time.   RN has informed pt that her provider will be notified and verified pt has name and contact number for [facility] services in the future. No other inquires or request at this time. Case will be closed.  Raina Mina, RN Care Management Coordinator [place] Office [phone]

## 2018-01-24 NOTE — Patient Outreach (Signed)
[facility]) Care Management  01/24/2018  JASMEET MANTON 12-12-1950 [id number]  Follow up EMMI RED on 12/05 however RN attempted a call yesterday but was unsuccessful.  RN another attempted outreach to pt today however voice box is FULL. Therefore RN call pt's spouse cell and spoke briefly with the pt's spouse who indicated he would past a message to the pt to call back. Will wait a call back for [facility] services.  Raina Mina, RN Care Management Coordinator [place] Office [phone]

## 2018-01-29 ENCOUNTER — Ambulatory Visit: Payer: Self-pay | Admitting: *Deleted

## 2018-03-16 ENCOUNTER — Other Ambulatory Visit (HOSPITAL_COMMUNITY): Payer: Self-pay | Admitting: Internal Medicine

## 2018-03-17 NOTE — Telephone Encounter (Signed)
Pt last saw Dr Meda Coffee on 05/28/17, last labs 01/22/18 Creat 0.68, age 68, weight 73.5kg, based on specified criteria pt is on appropriate dosage of Eliquis 5mg  BID.  Will refill rx.

## 2018-03-24 ENCOUNTER — Encounter: Payer: Self-pay | Admitting: Internal Medicine

## 2018-03-24 ENCOUNTER — Encounter (INDEPENDENT_AMBULATORY_CARE_PROVIDER_SITE_OTHER): Payer: Self-pay

## 2018-03-24 ENCOUNTER — Ambulatory Visit (INDEPENDENT_AMBULATORY_CARE_PROVIDER_SITE_OTHER): Payer: Medicare Other | Admitting: Internal Medicine

## 2018-03-24 ENCOUNTER — Encounter

## 2018-03-24 VITALS — BP 124/70 | HR 62 | Ht 67.0 in | Wt 164.6 lb

## 2018-03-24 DIAGNOSIS — I480 Paroxysmal atrial fibrillation: Secondary | ICD-10-CM | POA: Diagnosis not present

## 2018-03-24 DIAGNOSIS — I10 Essential (primary) hypertension: Secondary | ICD-10-CM | POA: Diagnosis not present

## 2018-03-24 DIAGNOSIS — I2510 Atherosclerotic heart disease of native coronary artery without angina pectoris: Secondary | ICD-10-CM | POA: Diagnosis not present

## 2018-03-24 NOTE — Progress Notes (Signed)
PCP: Dineen Kid, MD Primary Cardiologist: Dr Meda Coffee Primary EP: Dr Bolivar Haw Melanie Lucas is a 68 y.o. female who presents today for routine electrophysiology followup.  I have not seen her in well over a year.  Since last being seen in our clinic, the patient reports doing reasonably well.  She has occasional afib.  She is not clear as to how often she is having afib.  She also has occasional jaw and chest pain which she thinks could be due to afib but is not sure.  Today, she denies symptoms of chest pain, shortness of breath,  lower extremity edema, dizziness, presyncope, or syncope.  The patient is otherwise without complaint today.   Past Medical History:  Diagnosis Date  . Anxiety   . Cancer (HCC)    Skin- leg   . Chronic acquired lymphedema    a. R>L;  b. 03/2012 Neg LE U/S for DVT. Legs since age 1  . Complication of anesthesia    woke up during colonoscopy, Lithrostrixpy, Biospy  . COPD (chronic obstructive pulmonary disease) ([place])   . Coronary artery disease   . CVD (cardiovascular disease)   . Deaf, right   . Diabetes mellitus    Type II  . Diabetic neuropathy ([place])   . Dysrhythmia    afib  . Elevated TSH    a. 10/2012 - inst to f/u PCP.  . Full dentures   . Hematuria    a. while on pradaxa,  she reports that she has seen Dr Margie Ege and had low risk cystoscopy  . History of kidney stones   . Hyperlipidemia   . Hypertension    patient denies  . Incontinence    prior to urinating  . Lacunar infarction ([place])    a. 02/2009 non-acute Lacunar infarct of the right thalamus noted on MRI of brain.  . Left carotid stenosis   . Lymphedema of extremity    had this problem, since she was a teenager. especially seen in R LE  . Meniere disease   . Myocardial infarction ([place])   . Obesity   . Osteoarthritis    cervical & lumbar region, knees, hands cramp also   . PAF with post-termination pauses    a. on dronedarone;  b. CHA2DS2VASc = 5 (HTN, DM, h/o lacunar infarct on MRI,  Female) ->refused oral anticoagulation after h/o hematuria on pradaxa;  c. 02/2012 Echo: EF 55-60%, mildly dil LA. D. Recurrent PAF 10/2012 after only taking Multaq 1x/day - spont conv to NSR, placed back on BID Multaq/eliquis;  e. 10/2014 Multaq d/c'd->tikosyn initiated.  . Shortness of breath dyspnea    with exertion  . Stroke [facility])    2 mini strokes  . Tobacco abuse    Past Surgical History:  Procedure Laterality Date  . ANTERIOR CERVICAL CORPECTOMY N/A 02/21/2016   Procedure: ANTERIOR CERVICAL CORPECTOMY ANS FUSION CERVICAL SIX , ANTERIOR PLATING CERVICAL FIVE-SEVEN;  Surgeon: Consuella Lose, MD;  Location: [place];  Service: Neurosurgery;  Laterality: N/A;  . ANTERIOR FUSION CERVICAL SPINE  02/21/2016  . Biopsy of adrenal glands    . BREAST BIOPSY Bilateral    benign results, both breasts   . CARDIAC CATHETERIZATION  2014   x1 stent placed, done in [place]   . CARDIOVERSION N/A 11/11/2014   Procedure: CARDIOVERSION;  Surgeon: Larey Dresser, MD;  Location: [place];  Service: Cardiovascular;  Laterality: N/A;  . CAROTID ENDARTERECTOMY Left 07/29/2017  . COLONOSCOPY W/ POLYPECTOMY    .  CORONARY STENT PLACEMENT    . ENDARTERECTOMY Left 07/29/2017   Procedure: ENDARTERECTOMY CAROTID LEFT;  Surgeon: Rosetta Posner, MD;  Location: [place];  Service: Vascular;  Laterality: Left;  . PATCH ANGIOPLASTY Left 07/29/2017   Procedure: PATCH ANGIOPLASTY USING HEMASHIELD GOLD VASCULAR PATCH;  Surgeon: Rosetta Posner, MD;  Location: [place];  Service: Vascular;  Laterality: Left;  . TEE WITHOUT CARDIOVERSION N/A 11/11/2014   Procedure: TRANSESOPHAGEAL ECHOCARDIOGRAM (TEE);  Surgeon: Larey Dresser, MD;  Location: [place];  Service: Cardiovascular;  Laterality: N/A;    ROS- all systems are reviewed and negatives except as per HPI above  Current Outpatient Medications  Medication Sig Dispense Refill  . albuterol (PROVENTIL HFA;VENTOLIN HFA) 108 (90 Base) MCG/ACT inhaler Inhale 2 puffs into the  lungs every 6 (six) hours as needed for wheezing or shortness of breath.     [name] aspirin EC 81 MG tablet Take 1 tablet (81 mg total) by mouth daily. 30 tablet 1  . dofetilide (TIKOSYN) 500 MCG capsule TAKE 1 CAPSULE (500 MCG TOTAL) 2 (TWO) TIMES DAILY BY MOUTH. 180 capsule 1  . DULOXETINE HCL PO Take 40 mg by mouth daily.     [name] ELIQUIS 5 MG TABS tablet TAKE 1 TABLET BY MOUTH TWICE A DAY 180 tablet 1  . Empagliflozin-metFORMIN HCl (SYNJARDY) 12.06-998 MG TABS Take 1 tablet by mouth 2 (two) times daily.    . furosemide (LASIX) 20 MG tablet Take 20 mg by mouth as needed for edema.     [name] ibuprofen (ADVIL,MOTRIN) 800 MG tablet Take 800 mg by mouth 3 (three) times daily as needed for mild pain.     . Multiple Vitamin (MULTIVITAMIN) tablet Take 1 tablet by mouth at bedtime.     . rosuvastatin (CRESTOR) 5 MG tablet Take 5 mg by mouth at bedtime.      No current facility-administered medications for this visit.     Physical Exam: Vitals:   03/24/18 1141  BP: 124/70  Pulse: 62  SpO2: 97%  Weight: 164 lb 9.6 oz (74.7 kg)  Height: 5\' 7"  (1.702 m)    GEN- The patient is well appearing, alert and oriented x 3 today.   Head- normocephalic, atraumatic Eyes-  Sclera clear, conjunctiva pink Ears- hearing intact Oropharynx- clear Lungs- Clear to ausculation bilaterally, normal work of breathing Heart- Regular rate and rhythm, no murmurs, rubs or gallops, PMI not laterally displaced GI- soft, NT, ND, + BS Extremities- no clubbing, cyanosis, or edema  Wt Readings from Last 3 Encounters:  03/24/18 164 lb 9.6 oz (74.7 kg)  11/29/17 163 lb (73.9 kg)  08/13/17 162 lb (73.5 kg)    EKG tracing ordered today is personally reviewed and shows sinus rhythm 62 bpm, PR 134 msec, RBBB, LAHB. QTc 468 msec  Assessment and Plan:  1. Paroxysmal atrial fibrillation Remains on tikosyn Qt looks good, recent labs ok chads2vasc score is 5.  Continue eliquis I worry about her symptoms.  I am not sure as to how  often she is having afib and if this is the cause of her chest pain and jaw pain.  She would like to better characterize and manage her  Afib.  I agree with this concern.  She has previously worn 30 day monitors which have not been successful in this regardi.   I have therefore advised long term monitoring with an implantable loop recorder.  Risks and benefits to ILR as well as long term monitoring were discussed at length with  the patient today who wishes to proceed.  She prefers in office implant rather than in hospital.  2. HTN Stable No change required today  3. CAD No ischemic symptoms  4. Tobacco Not ready to quit  5. Overweight She has made major improvements and is working diligently to keep her weight down.  Follow-up me in 2 months  Thompson Grayer MD, [facility] 03/24/2018 11:49 AM

## 2018-03-24 NOTE — Patient Instructions (Addendum)
Medication Instructions:  Your physician recommends that you continue on your current medications as directed. Please refer to the Current Medication list given to you today.  Labwork: None ordered.  Testing/Procedures: None ordered.  Follow-Up: Your physician wants you to follow-up in: 2 months with Dr. Rayann Heman- OV    Any Other Special Instructions Will Be Listed Below (If Applicable).  If you need a refill on your cardiac medications before your next appointment, please call your pharmacy.

## 2018-03-31 ENCOUNTER — Telehealth: Payer: Self-pay

## 2018-03-31 ENCOUNTER — Ambulatory Visit: Payer: Medicare Other | Admitting: Internal Medicine

## 2018-03-31 NOTE — Telephone Encounter (Signed)
Left message requesting call back.  Will advise Pt placing loops in office on April 07, 2018  Left this nurse name and # for call back.

## 2018-04-01 ENCOUNTER — Ambulatory Visit: Payer: Medicare Other | Admitting: Neurology

## 2018-04-01 ENCOUNTER — Encounter: Payer: Self-pay | Admitting: Neurology

## 2018-04-01 ENCOUNTER — Other Ambulatory Visit: Payer: Self-pay

## 2018-04-01 DIAGNOSIS — F17210 Nicotine dependence, cigarettes, uncomplicated: Secondary | ICD-10-CM | POA: Diagnosis not present

## 2018-04-01 DIAGNOSIS — I6381 Other cerebral infarction due to occlusion or stenosis of small artery: Secondary | ICD-10-CM

## 2018-04-01 DIAGNOSIS — I482 Chronic atrial fibrillation, unspecified: Secondary | ICD-10-CM

## 2018-04-01 NOTE — Patient Instructions (Addendum)
I had a long d/w patient and her husband about her recent lacunar strokes and TIA, atrial fibrillation,, risk for recurrent stroke/TIAs, personally independently reviewed imaging studies and stroke evaluation results and answered questions.Continue Eliquis (apixaban) daily  for secondary stroke prevention and maintain strict control of hypertension with blood pressure goal below 130/90, diabetes with hemoglobin A1c goal below 6.5% and lipids with LDL cholesterol goal below 70 mg/dL. I also advised the patient to eat a healthy diet with plenty of whole grains, cereals, fruits and vegetables, exercise regularly and maintain ideal body weight .I have counseled the patient to quit smoking completely and seek help from her primary medical doctor for the same.  Followup in the future with my nurse practitioner Janett Billow or call earlier if necessary   Coping with Quitting Smoking  Quitting smoking is a physical and mental challenge. You will face cravings, withdrawal symptoms, and temptation. Before quitting, work with your health care provider to make a plan that can help you cope. Preparation can help you quit and keep you from giving in. How can I cope with cravings? Cravings usually last for 5-10 minutes. If you get through it, the craving will pass. Consider taking the following actions to help you cope with cravings:  Keep your mouth busy: ? Chew sugar-free gum. ? Suck on hard candies or a straw. ? Brush your teeth.  Keep your hands and body busy: ? Immediately change to a different activity when you feel a craving. ? Squeeze or play with a ball. ? Do an activity or a hobby, like making bead jewelry, practicing needlepoint, or working with wood. ? Mix up your normal routine. ? Take a short exercise break. Go for a quick walk or run up and down stairs. ? Spend time in public places where smoking is not allowed.  Focus on doing something kind or helpful for someone else.  Call a friend or family  member to talk during a craving.  Join a support group.  Call a quit line, such as 1-800-QUIT-NOW.  Talk with your health care provider about medicines that might help you cope with cravings and make quitting easier for you. How can I deal with withdrawal symptoms? Your body may experience negative effects as it tries to get used to not having nicotine in the system. These effects are called withdrawal symptoms. They may include:  Feeling hungrier than normal.  Trouble concentrating.  Irritability.  Trouble sleeping.  Feeling depressed.  Restlessness and agitation.  Craving a cigarette. To manage withdrawal symptoms:  Avoid places, people, and activities that trigger your cravings.  Remember why you want to quit.  Get plenty of sleep.  Avoid coffee and other caffeinated drinks. These may worsen some of your symptoms. How can I handle social situations? Social situations can be difficult when you are quitting smoking, especially in the first few weeks. To manage this, you can:  Avoid parties, bars, and other social situations where people might be smoking.  Avoid alcohol.  Leave right away if you have the urge to smoke.  Explain to your family and friends that you are quitting smoking. Ask for understanding and support.  Plan activities with friends or family where smoking is not an option. What are some ways I can cope with stress? Wanting to smoke may cause stress, and stress can make you want to smoke. Find ways to manage your stress. Relaxation techniques can help. For example:  Breathe slowly and deeply, in through your nose and out  through your mouth.  Listen to soothing, relaxing music.  Talk with a family member or friend about your stress.  Light a candle.  Soak in a bath or take a shower.  Think about a peaceful place. What are some ways I can prevent weight gain? Be aware that many people gain weight after they quit smoking. However, not everyone  does. To keep from gaining weight, have a plan in place before you quit and stick to the plan after you quit. Your plan should include:  Having healthy snacks. When you have a craving, it may help to: ? Eat plain popcorn, crunchy carrots, celery, or other cut vegetables. ? Chew sugar-free gum.  Changing how you eat: ? Eat small portion sizes at meals. ? Eat 4-6 small meals throughout the day instead of 1-2 large meals a day. ? Be mindful when you eat. Do not watch television or do other things that might distract you as you eat.  Exercising regularly: ? Make time to exercise each day. If you do not have time for a long workout, do short bouts of exercise for 5-10 minutes several times a day. ? Do some form of strengthening exercise, like weight lifting, and some form of aerobic exercise, like running or swimming.  Drinking plenty of water or other low-calorie or no-calorie drinks. Drink 6-8 glasses of water daily, or as much as instructed by your health care provider. Summary  Quitting smoking is a physical and mental challenge. You will face cravings, withdrawal symptoms, and temptation to smoke again. Preparation can help you as you go through these challenges.  You can cope with cravings by keeping your mouth busy (such as by chewing gum), keeping your body and hands busy, and making calls to family, friends, or a helpline for people who want to quit smoking.  You can cope with withdrawal symptoms by avoiding places where people smoke, avoiding drinks with caffeine, and getting plenty of rest.  Ask your health care provider about the different ways to prevent weight gain, avoid stress, and handle social situations. This information is not intended to replace advice given to you by your health care provider. Make sure you discuss any questions you have with your health care provider. Document Released: 02/03/2016 Document Revised: 02/03/2016 Document Reviewed: 02/03/2016 Elsevier  Interactive Patient Education  2019 [employer].

## 2018-04-01 NOTE — Progress Notes (Signed)
[facility] [address] [place]. [place] [address] [phone]       OFFICE FOLLOW-UP NOTE  [name] Date of Birth:  06/03/1950 Medical Record Number:  [id number]   HPI: [name] is a 68 year old Caucasian lady seen today for initial office follow-up visit following hospital admission for recurrent strokes in May, November and December 2019.  History is obtained from the patient, husband, review of electronic medical records and have personally reviewed imaging films.  She has a past medical history of chronic lymphedema right leg, coronary artery disease, COPD, deafness in the right ear, diabetes, diabetic neuropathy, chronic atrial fibrillation, hypertension, hyperlipidemia, right thalamic infarct January 2011, left carotid stenosis status post surgery, myocardial infarction, obesity, osteoarthritis, left brain TIA in May 2019, right coronary radiata infarct in November 2019 and most recent recent right periventricular infarct in December 2019. She presented on 01/21/2018 with weakness in the legs and MRI scan of the brain showed tiny right lateral ventricular periventricular infarct with subtle enhancement.  The MRI scan did not correlate with the symptoms.  She has longstanding history of chronic heavy smoking and has not quit smoking despite being asked to do so.  She did not get a repeat stroke work-up since she had it done previously in the month of November and she was admitted with a right corona radiata infarct.  At that time CT angiogram of the head and neck showed postoperative changes of left carotid surgery with 50% right ICA stenosis.  Transthoracic echo showed normal ejection fraction.  LDL cholesterol was 45 mg percent.  Hemoglobin A1c was elevated at 9.3.  Patient has been on Eliquis for anticoagulation and has been compliant with it.  She states she is done well since discharge.  She is finished home physical and Occupational Therapy.  She still has some  mild subjective left-sided weakness.  She has mild gait ataxia and paresthesias in the feet from diabetic neuropathy which is longstanding.  The patient is reluctant to quit smoking as she states that it is too difficult for her.  She is tolerating Eliquis well without significant bleeding or bruising. ROS:   14 system review of systems is positive for fatigue, eye itching, cough, drooling, trouble swallowing, hearing loss, ringing in the ears, leg swelling, palpitations, nausea, excessive thirst, frequency of urination, bladder urgency, walking difficulty, back and joint pain, skin wounds, rash, itching, tremors, speech difficulty, numbness, dizziness, confusion, depression and all other systems negative PMH:  Past Medical History:  Diagnosis Date  . Anxiety   . Cancer (HCC)    Skin- leg   . Chronic acquired lymphedema    a. R>L;  b. 03/2012 Neg LE U/S for DVT. Legs since age 29  . Complication of anesthesia    woke up during colonoscopy, Lithrostrixpy, Biospy  . COPD (chronic obstructive pulmonary disease) ([place])   . Coronary artery disease   . CVD (cardiovascular disease)   . Deaf, right   . Diabetes mellitus    Type II  . Diabetic neuropathy ([place])   . Dysrhythmia    afib  . Elevated TSH    a. 10/2012 - inst to f/u PCP.  . Full dentures   . Hematuria    a. while on pradaxa,  she reports that she has seen Dr Margie Ege and had low risk cystoscopy  . History of kidney stones   . Hyperlipidemia   . Hypertension    patient denies  . Incontinence    prior  to urinating  . Lacunar infarction ([place])    a. 02/2009 non-acute Lacunar infarct of the right thalamus noted on MRI of brain.  . Left carotid stenosis   . Lymphedema of extremity    had this problem, since she was a teenager. especially seen in R LE  . Meniere disease   . Myocardial infarction ([place])   . Obesity   . Osteoarthritis    cervical & lumbar region, knees, hands cramp also   . PAF with post-termination pauses    a. on  dronedarone;  b. CHA2DS2VASc = 5 (HTN, DM, h/o lacunar infarct on MRI, Female) ->refused oral anticoagulation after h/o hematuria on pradaxa;  c. 02/2012 Echo: EF 55-60%, mildly dil LA. D. Recurrent PAF 10/2012 after only taking Multaq 1x/day - spont conv to NSR, placed back on BID Multaq/eliquis;  e. 10/2014 Multaq d/c'd->tikosyn initiated.  . Shortness of breath dyspnea    with exertion  . Stroke [facility])    2 mini strokes  . Tobacco abuse   . Vision abnormalities     Social History:  Social History   Socioeconomic History  . Marital status: Married    Spouse name: Melanie Lucas  . Number of children: 2  . Years of education: 72  . Highest education level: Master's degree (e.g., MA, MS, MEng, MEd, MSW, MBA)  Occupational History  . Occupation: retired    Comment: Pharmacist, hospital - art  Social Needs  . Financial resource strain: Not on file  . Food insecurity:    Worry: Not on file    Inability: Not on file  . Transportation needs:    Medical: Not on file    Non-medical: Not on file  Tobacco Use  . Smoking status: Current Every Day Smoker    Packs/day: 1.00    Years: 55.00    Pack years: 55.00    Types: Cigarettes  . Smokeless tobacco: Never Used  . Tobacco comment: pt has patches   Substance and Sexual Activity  . Alcohol use: Yes    Comment: 1 beer per month  . Drug use: No  . Sexual activity: Yes  Lifestyle  . Physical activity:    Days per week: Not on file    Minutes per session: Not on file  . Stress: Not on file  Relationships  . Social connections:    Talks on phone: Not on file    Gets together: Not on file    Attends religious service: Not on file    Active member of club or organization: Not on file    Attends meetings of clubs or organizations: Not on file    Relationship status: Not on file  . Intimate partner violence:    Fear of current or ex partner: Not on file    Emotionally abused: Not on file    Physically abused: Not on file    Forced sexual activity: Not on  file  Other Topics Concern  . Not on file  Social History Narrative   She operates an entertainment business    Medications:   Current Outpatient Medications on File Prior to Visit  Medication Sig Dispense Refill  . albuterol (PROVENTIL HFA;VENTOLIN HFA) 108 (90 Base) MCG/ACT inhaler Inhale 2 puffs into the lungs every 6 (six) hours as needed for wheezing or shortness of breath.     [name] aspirin EC 81 MG tablet Take 1 tablet (81 mg total) by mouth daily. 30 tablet 1  . dofetilide (TIKOSYN) 500 MCG capsule TAKE 1  CAPSULE (500 MCG TOTAL) 2 (TWO) TIMES DAILY BY MOUTH. 180 capsule 1  . DULOXETINE HCL PO Take 40 mg by mouth daily.     [name] ELIQUIS 5 MG TABS tablet TAKE 1 TABLET BY MOUTH TWICE A DAY 180 tablet 1  . Empagliflozin-metFORMIN HCl (SYNJARDY) 12.06-998 MG TABS Take 1 tablet by mouth 2 (two) times daily.    . furosemide (LASIX) 20 MG tablet Take 20 mg by mouth as needed for edema.     [name] ibuprofen (ADVIL,MOTRIN) 800 MG tablet Take 800 mg by mouth 3 (three) times daily as needed for mild pain.     . Multiple Vitamin (MULTIVITAMIN) tablet Take 1 tablet by mouth at bedtime.     . repaglinide (PRANDIN) 1 MG tablet Take 1 mg by mouth 3 (three) times daily before meals.    . rosuvastatin (CRESTOR) 5 MG tablet Take 5 mg by mouth at bedtime.      No current facility-administered medications on file prior to visit.     Allergies:   Allergies  Allergen Reactions  . Avelox [Moxifloxacin]     Unknown reaction   . Chantix [Varenicline]     nightmares  . Fosamax [Alendronate]     Pain all over  . Pradaxa [Dabigatran Etexilate Mesylate]     Extreme bleeding  . Statins Nausea And Vomiting and Other (See Comments)    Muscle pain, Dizziness (intolerance)  . Sulfamethoxazole Nausea And Vomiting    Dizziness (intolerance)  . Sulfonamide Derivatives Hives    Nausea vertigo  . Ciprofloxacin Rash  . Gabapentin Rash    burning  . Tape Itching and Rash    Please use "paper" tape only    Physical  Exam General: well developed, well nourished middle-aged Caucasian lady, seated, in no evident distress Head: head normocephalic and atraumatic.  Neck: supple with no carotid or supraclavicular bruits Cardiovascular: regular rate and rhythm, no murmurs Musculoskeletal: no deformity Skin:  no rash/petichiae 3+ right lower extremity edema and 1+ left lower extremity edema. Vascular:  Normal pulses all extremities There were no vitals filed for this visit. Neurologic Exam Mental Status: Awake and fully alert. Oriented to place and time. Recent and remote memory intact. Attention span, concentration and fund of knowledge appropriate. Mood and affect appropriate.  Cranial Nerves: Fundoscopic exam reveals sharp disc margins. Pupils equal, briskly reactive to light. Extraocular movements full without nystagmus. Visual fields full to confrontation. Hearing intact. Facial sensation intact. Face, tongue, palate moves normally and symmetrically.  Motor: Normal bulk and tone. Normal strength in all tested extremity muscles.  Diminished fine finger movements on the left.  Orbits right over left upper extremity.  No resting or action tremor noted. Sensory.: intact to touch ,pinprick .position sense but diminished vibratory sensation from ankle down bilaterally..  Coordination: Rapid alternating movements normal in all extremities. Finger-to-nose and heel-to-shin performed accurately bilaterally. Gait and Station: Arises from chair without difficulty. Stance is normal. Gait is slightly broad-based but demonstrates normal stride length and balance . Able to heel, toe and tandem walk with mild difficulty.  Reflexes: 1+ and symmetric except both ankle jerks are depressed. Toes downgoing.   NIHSS  0 Modified Rankin  2  ASSESSMENT: 68 year old lady with right periventricular white matter infarct in December 2019, right corona radiata infarct in November 2019 and left brain TIA in May 2019 and right thalamic  infarct in 2011 due to small vessel disease but longstanding history of chronic atrial fibrillation on anticoagulation with Eliquis.  Also  mild underlying chronic peripheral sensory neuropathy.  Vascular risk factors of diabetes, hypertension, hyperlipidemia and atrial fibrillation    PLAN: I had a long d/w patient and her husband about her recent lacunar strokes and TIA, atrial fibrillation,, risk for recurrent stroke/TIAs, personally independently reviewed imaging studies and stroke evaluation results and answered questions.Continue Eliquis (apixaban) daily  for secondary stroke prevention and maintain strict control of hypertension with blood pressure goal below 130/90, diabetes with hemoglobin A1c goal below 6.5% and lipids with LDL cholesterol goal below 70 mg/dL. I also advised the patient to eat a healthy diet with plenty of whole grains, cereals, fruits and vegetables, exercise regularly and maintain ideal body weight .I have counseled the patient to quit smoking completely and seek help from her primary medical doctor for the same.  Followup in the future with my nurse practitioner Janett Billow or call earlier if necessary  Greater than 50% of time during this 25 minute visit was spent on counseling,explanation of diagnosis, planning of further management, discussion with patient and family and coordination of care Antony Contras, MD  [facility] [address] [place], [place] [address]  Phone [phone] Fax [phone] Note: This document was prepared with digital dictation and possible smart phrase technology. Any transcriptional errors that result from this process are unintentional

## 2018-04-02 NOTE — Telephone Encounter (Signed)
Left message requesting call back to [facility] with scheduling.  [name] direct number given.  Advised Pt could be scheduled for loop placement in office on April 07, 2018.  Requested call back today to [facility] to schedule.

## 2018-05-21 ENCOUNTER — Telehealth: Payer: Self-pay

## 2018-05-21 NOTE — Telephone Encounter (Signed)
lpmtcb 4/1

## 2018-05-22 ENCOUNTER — Telehealth: Payer: Self-pay

## 2018-05-22 NOTE — Telephone Encounter (Signed)
lpmtcb 4/2

## 2018-05-23 ENCOUNTER — Telehealth: Payer: Self-pay

## 2018-05-23 NOTE — Telephone Encounter (Signed)
lpmtcb 4/3

## 2018-05-26 ENCOUNTER — Telehealth: Payer: Self-pay

## 2018-05-26 NOTE — Telephone Encounter (Signed)
lpmtcb 4/6;  I have also left family members (DPR) messages.

## 2018-05-26 NOTE — Telephone Encounter (Signed)
   Cardiac Questionnaire:    Since your last visit or hospitalization:    1. Have you been having new or worsening chest pain? no   2. Have you been having new or worsening shortness of breath? no 3. Have you been having new or worsening leg swelling, wt gain, or increase in abdominal girth (pants fitting more tightly)? no   4. Have you had any passing out spells? no    *A YES to any of these questions would result in the appointment being kept. *If all the answers to these questions are NO, we should indicate that given the current situation regarding the worldwide coronarvirus pandemic, at the recommendation of the CDC, we are looking to limit gatherings in our waiting area, and thus will reschedule their appointment beyond four weeks from today.   _____________   Patient informed me that she had a TIA last Thursday 4/2, but did not want to go to the hospital.  She has verbally consented to a PHONE Virtual Visit with Lyda Jester, PA on 4/8 @ 10:00 am.

## 2018-05-26 NOTE — Telephone Encounter (Signed)
New Message   Patient returning Michael's call

## 2018-05-28 ENCOUNTER — Encounter: Payer: Self-pay | Admitting: Cardiology

## 2018-05-28 ENCOUNTER — Telehealth (INDEPENDENT_AMBULATORY_CARE_PROVIDER_SITE_OTHER): Payer: Medicare Other | Admitting: Internal Medicine

## 2018-05-28 ENCOUNTER — Other Ambulatory Visit: Payer: Self-pay

## 2018-05-28 ENCOUNTER — Telehealth (INDEPENDENT_AMBULATORY_CARE_PROVIDER_SITE_OTHER): Payer: Medicare Other | Admitting: Cardiology

## 2018-05-28 ENCOUNTER — Telehealth: Payer: Self-pay

## 2018-05-28 VITALS — BP 157/87 | HR 50 | Ht 67.0 in | Wt 165.0 lb

## 2018-05-28 DIAGNOSIS — I4891 Unspecified atrial fibrillation: Secondary | ICD-10-CM

## 2018-05-28 DIAGNOSIS — I10 Essential (primary) hypertension: Secondary | ICD-10-CM | POA: Diagnosis not present

## 2018-05-28 DIAGNOSIS — I4819 Other persistent atrial fibrillation: Secondary | ICD-10-CM

## 2018-05-28 DIAGNOSIS — I2510 Atherosclerotic heart disease of native coronary artery without angina pectoris: Secondary | ICD-10-CM | POA: Diagnosis not present

## 2018-05-28 DIAGNOSIS — I480 Paroxysmal atrial fibrillation: Secondary | ICD-10-CM

## 2018-05-28 NOTE — Telephone Encounter (Signed)
Spoke with pt regarding appt on 05/28/18. Pt stated she will check vitals day of appt and is unable to upload EKG. Pt questions and concerns were address.

## 2018-05-28 NOTE — Patient Instructions (Signed)
Medication Instructions:  None at this time.  I will call patient on 4/10 to check BP reading and relay to Brittainy. If you need a refill on your cardiac medications before your next appointment, please call your pharmacy.   Lab work: none If you have labs (blood work) drawn today and your tests are completely normal, you will receive your results only by: [name] MyChart Message (if you have MyChart) OR . A paper copy in the mail If you have any lab test that is abnormal or we need to change your treatment, we will call you to review the results.  Testing/Procedures: none  Follow-Up:To be determined by BP reading on 4/10. At [facility], you and your health needs are our priority.  As part of our continuing mission to provide you with exceptional heart care, we have created designated Provider Care Teams.  These Care Teams include your primary Cardiologist (physician) and Advanced Practice Providers (APPs -  Physician Assistants and Nurse Practitioners) who all work together to provide you with the care you need, when you need it.  Any Other Special Instructions Will Be Listed Below (If Applicable).  F/u with patient on 4/10 for BP reading.

## 2018-05-28 NOTE — Progress Notes (Signed)
She has already had a telehealth visit today with [employer].  I have reviewed her note.  I have cancelled my visit today.  I did speak with the patient and confirmed this with her.  I will reschedule an office visit with me for 3 months  Thompson Grayer MD, [place] 05/28/2018 12:13 PM

## 2018-05-28 NOTE — Progress Notes (Signed)
Virtual Visit via Telephone Note   This visit type was conducted due to national recommendations for restrictions regarding the COVID-19 Pandemic (e.g. social distancing) in an effort to limit this patient's exposure and mitigate transmission in our community.  Due to her co-morbid illnesses, this patient is at least at moderate risk for complications without adequate follow up.  This format is felt to be most appropriate for this patient at this time.  The patient did not have access to video technology/had technical difficulties with video requiring transitioning to audio format only (telephone).  All issues noted in this document were discussed and addressed.  No physical exam could be performed with this format.  Please refer to the patient's chart for her  consent to telehealth for [facility].   Evaluation Performed:  Follow-up visit  Date:  05/28/2018   ID:  Melanie Lucas, Melanie Lucas 10-Sep-1950, MRN [id number]  Patient Location: Home  Provider Location: Home  PCP:  Kristen Loader, FNP  Cardiologist:  Ena Dawley, MD  Electrophysiologist:  Thompson Grayer, MD   Chief Complaint: Follow-up for CAD  History of Present Illness:    Melanie Lucas is a 68 y.o. female who presents via audio/video conferencing for a telehealth visit today.   She is followed by Dr. Meda Coffee. Dr. Rayann Heman is her electrophysiologist. She has a history of atrial fib on AAD therapy w/ Tikosyn. She stopped her anticoagulation back in October 2014 because of hematuria on Pradaxa and was reluctant to restart, but ultimately started on Eliquis.  She has CHA2Ds2VASc=5 (HTN,DM, H/O lacunar infarcton MRI, female).  Echo 02/2012 EF 55-60%, mildly dilated LA. Active smoker, 1 pack/day. Has COPD, thus not on  blocker due to wheezing.   Also has CAD. She was traveling to an [place] in [place] in 2015 and suffered an MI. Dr. Carlis Stable called our office to discuss her history as she was transferred to a [facility] in [place]. She  underwent stenting of a first diagonal that was 95% occluded. She received bare-metal stent because of her history of not taking anticoagulants and hematuria. She was supposed to be sent home on Plavix and aspirin but was never given the Plavix.   She also has carotid artery disease.  She had a TIA in June 2019 secondary to obstructive left carotid artery and underwent carotid endarterectomy by Dr. Donnetta Hutching.  She has chronic lymphedema more on prominent on her left right lower extremity ever since she was 68 year old.  From a cardiac standpoint, she denies any anginal symptomatology.  She is active around her home.  She has been doing a lot of yard work over the past week.  She denies any exertional chest pain.  She does have mild exertional dyspnea, which she reports is chronic and she attributes to her COPD.  She denies any palpitations.  Her resting heart rate is in the upper 50s/ low 60s.  She reports full compliance with Eliquis.  She denies any abnormal bleeding.  She does however report that she experienced abnormalities last Thursday which she felt was a TIA.  She reports that she, all of a sudden, had speech difficulty.  She could not speak in [place].  She could only speak [place].  She did not have any other associated symptoms.  No numbness tingling or weakness.  No visual changes.  She went to bed and woke up the next morning normal.  Again, she reports full compliance with Eliquis.  No missed doses.  Based on  her home blood pressure cuff this morning, her blood pressure is high at 157/88.  She does not usually check her blood pressure on a regular basis.  Per med review, she is not currently on any antihypertensive.  She does have Lasix ordered but this is PRN for edema.  As noted above, she is an active smoker.  She smokes 1 pack/day.  She also admits to eating a diet high in sodium.  Some occasional caffeine use.  No habitual use of ibuprofen.  She denies any pain.  The patient does not have  symptoms concerning for COVID-19 infection (fever, chills, cough, or new shortness of breath).    Past Medical History:  Diagnosis Date  . Anxiety   . Cancer (HCC)    Skin- leg   . Chronic acquired lymphedema    a. R>L;  b. 03/2012 Neg LE U/S for DVT. Legs since age 41  . Complication of anesthesia    woke up during colonoscopy, Lithrostrixpy, Biospy  . COPD (chronic obstructive pulmonary disease) ([place])   . Coronary artery disease   . CVD (cardiovascular disease)   . Deaf, right   . Diabetes mellitus    Type II  . Diabetic neuropathy ([place])   . Dysrhythmia    afib  . Elevated TSH    a. 10/2012 - inst to f/u PCP.  . Full dentures   . Hematuria    a. while on pradaxa,  she reports that she has seen Dr Margie Ege and had low risk cystoscopy  . History of kidney stones   . Hyperlipidemia   . Hypertension    patient denies  . Incontinence    prior to urinating  . Lacunar infarction ([place])    a. 02/2009 non-acute Lacunar infarct of the right thalamus noted on MRI of brain.  . Left carotid stenosis   . Lymphedema of extremity    had this problem, since she was a teenager. especially seen in R LE  . Meniere disease   . Myocardial infarction ([place])   . Obesity   . Osteoarthritis    cervical & lumbar region, knees, hands cramp also   . PAF with post-termination pauses    a. on dronedarone;  b. CHA2DS2VASc = 5 (HTN, DM, h/o lacunar infarct on MRI, Female) ->refused oral anticoagulation after h/o hematuria on pradaxa;  c. 02/2012 Echo: EF 55-60%, mildly dil LA. D. Recurrent PAF 10/2012 after only taking Multaq 1x/day - spont conv to NSR, placed back on BID Multaq/eliquis;  e. 10/2014 Multaq d/c'd->tikosyn initiated.  . Shortness of breath dyspnea    with exertion  . Stroke [facility])    2 mini strokes  . Tobacco abuse   . Vision abnormalities    Past Surgical History:  Procedure Laterality Date  . ANTERIOR CERVICAL CORPECTOMY N/A 02/21/2016   Procedure: ANTERIOR CERVICAL CORPECTOMY ANS FUSION  CERVICAL SIX , ANTERIOR PLATING CERVICAL FIVE-SEVEN;  Surgeon: Consuella Lose, MD;  Location: [place];  Service: Neurosurgery;  Laterality: N/A;  . ANTERIOR FUSION CERVICAL SPINE  02/21/2016  . Biopsy of adrenal glands    . BREAST BIOPSY Bilateral    benign results, both breasts   . CARDIAC CATHETERIZATION  2014   x1 stent placed, done in [place]   . CARDIOVERSION N/A 11/11/2014   Procedure: CARDIOVERSION;  Surgeon: Larey Dresser, MD;  Location: [place];  Service: Cardiovascular;  Laterality: N/A;  . CAROTID ENDARTERECTOMY Left 07/29/2017  . COLONOSCOPY W/ POLYPECTOMY    . CORONARY STENT  PLACEMENT    . ENDARTERECTOMY Left 07/29/2017   Procedure: ENDARTERECTOMY CAROTID LEFT;  Surgeon: Rosetta Posner, MD;  Location: [place];  Service: Vascular;  Laterality: Left;  . PATCH ANGIOPLASTY Left 07/29/2017   Procedure: PATCH ANGIOPLASTY USING HEMASHIELD GOLD VASCULAR PATCH;  Surgeon: Rosetta Posner, MD;  Location: [place];  Service: Vascular;  Laterality: Left;  . TEE WITHOUT CARDIOVERSION N/A 11/11/2014   Procedure: TRANSESOPHAGEAL ECHOCARDIOGRAM (TEE);  Surgeon: Larey Dresser, MD;  Location: [facility];  Service: Cardiovascular;  Laterality: N/A;     Current Meds  Medication Sig  . albuterol (PROVENTIL HFA;VENTOLIN HFA) 108 (90 Base) MCG/ACT inhaler Inhale 2 puffs into the lungs every 6 (six) hours as needed for wheezing or shortness of breath.   [name] aspirin EC 81 MG tablet Take 1 tablet (81 mg total) by mouth daily.  [name] dofetilide (TIKOSYN) 500 MCG capsule TAKE 1 CAPSULE (500 MCG TOTAL) 2 (TWO) TIMES DAILY BY MOUTH.  . DULOXETINE HCL PO Take 40 mg by mouth daily.   [name] ELIQUIS 5 MG TABS tablet TAKE 1 TABLET BY MOUTH TWICE A DAY  . Empagliflozin-metFORMIN HCl (SYNJARDY) 12.06-998 MG TABS Take 1 tablet by mouth 2 (two) times daily.  . furosemide (LASIX) 20 MG tablet Take 20 mg by mouth as needed for edema.   [name] ibuprofen (ADVIL,MOTRIN) 800 MG tablet Take 800 mg by mouth 3 (three) times daily as  needed for mild pain.   . Multiple Vitamin (MULTIVITAMIN) tablet Take 1 tablet by mouth at bedtime.   . repaglinide (PRANDIN) 1 MG tablet Take 1 mg by mouth 3 (three) times daily before meals.  . rosuvastatin (CRESTOR) 5 MG tablet Take 5 mg by mouth at bedtime.      Allergies:   Avelox [moxifloxacin]; Chantix [varenicline]; Fosamax [alendronate]; Pradaxa [dabigatran etexilate mesylate]; Statins; Sulfamethoxazole; Sulfonamide derivatives; Ciprofloxacin; Gabapentin; and Tape   Social History   Tobacco Use  . Smoking status: Current Every Day Smoker    Packs/day: 1.00    Years: 55.00    Pack years: 55.00    Types: Cigarettes  . Smokeless tobacco: Never Used  . Tobacco comment: pt has patches   Substance Use Topics  . Alcohol use: Yes    Comment: 1 beer per month  . Drug use: No     Family Hx: The patient's family history includes Bladder Cancer in her mother; Breast cancer in her maternal aunt, maternal aunt, and paternal aunt; Coronary artery disease in her father; Heart attack in her father; Leukemia in her brother.  ROS:   Please see the history of present illness.     All other systems reviewed and are negative.   Prior CV studies:   The following studies were reviewed today:  TTE: 10/2014  - Left ventricle: The cavity size was normal. Systolic function was normal. The estimated ejection fraction was in the range of 50% to 55%. Wall motion was normal; there were no regional wall motion abnormalities. The study was not technically sufficient to allow evaluation of LV diastolic dysfunction due to atrial fibrillation. - Aortic valve: Trileaflet; normal thickness leaflets. There was no regurgitation. - Aortic root: The aortic root was normal in size. - Ascending aorta: The ascending aorta was normal in size. - Mitral valve: Structurally normal valve. There was mild regurgitation. - Left atrium: The atrium was normal in size. - Right ventricle: The cavity  size was normal. Wall thickness was normal. Systolic function was normal. - Tricuspid valve: There was mild-moderate  regurgitation. - Pulmonary arteries: Systolic pressure was mildly increased. PA peak pressure: 37 mm Hg (S). - Inferior vena cava: The vessel was normal in size. - Pericardium, extracardiac: There was no pericardial effusion.   Labs/Other Tests and Data Reviewed:    EKG:  An ECG dated 03/24/18 was personally reviewed today and demonstrated:  NSR w/ PACs 62 bpm, bifacicular block  Recent Labs: 06/24/2017: TSH 3.231 01/21/2018: ALT 17; Hemoglobin 15.6; Platelets 171 01/22/2018: BUN 14; Creatinine, Ser 0.68; Magnesium 2.1; Potassium 3.2; Sodium 141   Recent Lipid Panel Lab Results  Component Value Date/Time   CHOL 114 12/31/2017 02:33 AM   TRIG 114 12/31/2017 02:33 AM   HDL 46 12/31/2017 02:33 AM   CHOLHDL 2.5 12/31/2017 02:33 AM   LDLCALC 45 12/31/2017 02:33 AM    Wt Readings from Last 3 Encounters:  05/28/18 165 lb (74.8 kg)  03/24/18 164 lb 9.6 oz (74.7 kg)  11/29/17 163 lb (73.9 kg)     Objective:    Vital Signs:  BP (!) 157/87   Pulse (!) 50   Ht 5\' 7"  (1.702 m)   Wt 165 lb (74.8 kg)   BMI 25.84 kg/m    Well sounding female in no acute distress. Speaking in clear complete sentences.  Speech unlabored. Breathing unlabored.  ASSESSMENT & PLAN:    1.  CAD: status post PCI with placement of bare-metal stents to the diagonal branch in [place] in January of 2015.   Stable without anginal symptoms.  Continue aspirin and statin.  She is not on a beta-blocker due to COPD.  Smoking cessation encouraged.  2. Atrial Fibrillation: on AAD therapy w/ Tikosyn. On Eliquis for a/c. CHA2Ds2VASc=5 (HTN,DM, H/O lacunar infarcton MRI, female).  She denies any palpitations.  Heart rate in the upper 50s/ low 60s.  She denies any abnormal bleeding with Eliquis.  She has follow-up with Dr. Rayann Heman later today.  3. HTN: elevated this morning at 157/87.  Does not appear to  be on any antihypertensives currently.  She does not regularly check her blood pressure at home.  She admits to eating a high sodium diet and also smokes 1 pack of cigarettes a day.  Mild caffeine consumption.  No habitual use of ibuprofen.  Denies pain.  I will have my RN follow-up with her in 2 days for repeat blood pressure check.  If blood pressure remains elevated, we will start therapy.  Options however may be limited.  Due to treatment with Tikosyn, would need to try to avoid diuretics in an effort to reduce risk of hypokalemia.  Given her chronic lymphedema she would not be a good candidate for amlodipine.  Due to COPD, would not be a good candidate for beta-blockers.  Due to bradycardia would also not be a good candidate for beta-blocker/calcium channel blockers.  May be able to consider starting on an ARB such as losartan.  This would require baseline assessment of renal function and potassium levels (would need a BMP prior to initiation).  I also encouraged nonpharmacological treatment/lifestyle modification including reduction of salt intake and cutting down on cigarette use.  4. HLD: on statin therapy w/ Crestor. Reports compliance. Last lipid panel 12/2017 showed controlled LDL at 45 mg/dl.   5. Chronic Lymphedema, R>L: present since age 75. R>L. Recommended compression stockings. On Lasix PRN.   6. COPD: not on  blockers for cad/ afib b/c of this.   7. Carotid Artery Disease: s/p Lt CEA by Dr. Donnetta Hutching 07/2017. Continue ASA and  Crestor. Dopplers to be followed by Dr. Donnetta Hutching.   8.  Tobacco abuse: Active smoker.  She smokes 1 pack/day.  We discussed the importance of smoking cessation given her cardiovascular history as well as COPD.  COVID-19 Education: The signs and symptoms of COVID-19 were discussed with the patient and how to seek care for testing (follow up with PCP or arrange E-visit).  The importance of social distancing was discussed today.  Time:   Today, I have spent 25 minutes  with the patient with telehealth technology discussing the above problems.     Medication Adjustments/Labs and Tests Ordered: Current medicines are reviewed at length with the patient today.  Concerns regarding medicines are outlined above.  Tests Ordered: No orders of the defined types were placed in this encounter.  Medication Changes: No orders of the defined types were placed in this encounter.   Disposition:   RN will follow-up for repeat blood pressure check on 4/10.  If blood pressure remains elevated, we will start antihypertensive therapy and will arrange f/u telehealth visit in 2 to 3 weeks.  However if blood pressure is stable and no need for medication change, will plan to follow-up with Dr. Meda Coffee in 6 months.  Signed, Lyda Jester, PA-C  05/28/2018 9:46 AM    [facility]

## 2018-05-30 ENCOUNTER — Telehealth: Payer: Self-pay

## 2018-05-30 DIAGNOSIS — I10 Essential (primary) hypertension: Secondary | ICD-10-CM

## 2018-05-30 DIAGNOSIS — Z79899 Other long term (current) drug therapy: Secondary | ICD-10-CM

## 2018-05-30 MED ORDER — LOSARTAN POTASSIUM 25 MG PO TABS: 25.0000 mg | ORAL_TABLET | Freq: Every day | ORAL | 3 refills | Status: DC

## 2018-05-30 NOTE — Telephone Encounter (Signed)
Follow up:   Patient returning a call back.  

## 2018-05-30 NOTE — Telephone Encounter (Signed)
I spoke to the patient with Brittainy's recommendations.  She will come in Monday 4/13 for BMP, then start Losartan 25 mg daily and then come in on Monday 4/20 for another BMP.  We will call her on 4/27 for an update.

## 2018-05-30 NOTE — Telephone Encounter (Signed)
lpmtcb regarding BP. 4/10

## 2018-05-30 NOTE — Telephone Encounter (Signed)
Patient called back to record a BP of 161/89 with HR of 50.  Brittainy wanted a f/u BP from Wednesday's Virtual Visit.[name]

## 2018-05-30 NOTE — Telephone Encounter (Signed)
.     At this point, I recommend starting antihypertensive therapy. During tele visit 05/28/18, her initial SBP was in the 150s and repeat SBP in the 170s. Has SBP in the 160s today. Other medical problems include CAD, diastolic HF, atrial fibrillation and chronic lymphedema.   Difficult situation as medical options are limited. Despite afib, she also has bradycardia w/ baseline HR in the 50s, limiting use of  blockes and CCBs. Although use of diuretics is not an absolute contraindication w/ Tikosyn, I would prefer to avoid diuretics to reduce risk of hypokalemia. Given her chronic lymphedema, will also avoid amlodipine. Hydralazine also not ideal given TID dosing. I therefore recommend addition of an ARB, however this would require baseline assessment of renal function and K, as well as f/u labs 7-10 days after initiation. Last BMP was 01/2011. SCr was 0.68 and K was 3.2.   Check BMP for baseline assessment. Start Losartan 25 mg daily.  Repeat BMP 7-10 days after start.  Instruct pt to monitor BP at home. BP goal <130/80. We may need to titrate further based on response.  F/u phone call in 2 weeks.   Lyda Jester, PA-C 05/30/2018

## 2018-05-30 NOTE — Addendum Note (Signed)
Addended by: Frederik Schmidt on: 05/30/2018 02:00 PM   Modules accepted: Orders

## 2018-06-02 ENCOUNTER — Other Ambulatory Visit: Payer: Self-pay

## 2018-06-02 ENCOUNTER — Other Ambulatory Visit: Payer: Medicare Other | Admitting: *Deleted

## 2018-06-02 DIAGNOSIS — I10 Essential (primary) hypertension: Secondary | ICD-10-CM

## 2018-06-02 DIAGNOSIS — Z79899 Other long term (current) drug therapy: Secondary | ICD-10-CM

## 2018-06-03 LAB — BASIC METABOLIC PANEL
BUN/Creatinine Ratio: 18 (ref 12–28)
BUN: 12 mg/dL (ref 8–27)
CO2: 23 mmol/L (ref 20–29)
Calcium: 9.2 mg/dL (ref 8.7–10.3)
Chloride: 100 mmol/L (ref 96–106)
Creatinine, Ser: 0.68 mg/dL (ref 0.57–1.00)
GFR calc Af Amer: 103 mL/min/{1.73_m2} (ref >59)
GFR calc non Af Amer: 90 mL/min/{1.73_m2} (ref >59)
Glucose: 159 mg/dL — ABNORMAL HIGH (ref 65–99)
Potassium: 3.8 mmol/L (ref 3.5–5.2)
Sodium: 141 mmol/L (ref 134–144)

## 2018-06-04 ENCOUNTER — Telehealth: Payer: Self-pay

## 2018-06-04 NOTE — Telephone Encounter (Signed)
Notes recorded by Frederik Schmidt, RN on 06/04/2018 at 8:52 AM EDT The patient has been notified of the result and verbalized understanding. All questions (if any) were answered. Frederik Schmidt, RN 06/04/2018 8:52 AM

## 2018-06-04 NOTE — Telephone Encounter (Signed)
-----   Message from Consuelo Pandy, [place] sent at 06/03/2018  9:05 AM EDT ----- Renal function and K normal. Plan to start Losartan 25 mg like we discussed last week. Will need repeat BMP in 7-10 days after initiation to ensure both renal function and K remains normal w/ medication.

## 2018-06-06 ENCOUNTER — Ambulatory Visit: Payer: Medicare Other | Admitting: Internal Medicine

## 2018-06-09 ENCOUNTER — Other Ambulatory Visit: Payer: Medicare Other | Admitting: *Deleted

## 2018-06-09 ENCOUNTER — Other Ambulatory Visit: Payer: Self-pay

## 2018-06-09 DIAGNOSIS — Z79899 Other long term (current) drug therapy: Secondary | ICD-10-CM

## 2018-06-09 DIAGNOSIS — I10 Essential (primary) hypertension: Secondary | ICD-10-CM

## 2018-06-09 LAB — BASIC METABOLIC PANEL
BUN/Creatinine Ratio: 21 (ref 12–28)
BUN: 16 mg/dL (ref 8–27)
CO2: 25 mmol/L (ref 20–29)
Calcium: 9.3 mg/dL (ref 8.7–10.3)
Chloride: 98 mmol/L (ref 96–106)
Creatinine, Ser: 0.75 mg/dL (ref 0.57–1.00)
GFR calc Af Amer: 94 mL/min/{1.73_m2} (ref >59)
GFR calc non Af Amer: 82 mL/min/{1.73_m2} (ref >59)
Glucose: 234 mg/dL — ABNORMAL HIGH (ref 65–99)
Potassium: 3.9 mmol/L (ref 3.5–5.2)
Sodium: 141 mmol/L (ref 134–144)

## 2018-06-10 ENCOUNTER — Telehealth: Payer: Self-pay

## 2018-06-10 NOTE — Telephone Encounter (Signed)
-----   Message from Consuelo Pandy, [place] sent at 06/10/2018  9:32 AM EDT ----- Kidney function and potassium remains stable with new BP medication. Ok to continue taking. Blood glucose levels however have been high. She has DM and is on medications followed by PCP. Make sure she has been compliant w/ Diabetic regimen and she should let PCP know if glucose levels have been running high at home.

## 2018-06-10 NOTE — Telephone Encounter (Signed)
Notes recorded by Frederik Schmidt, RN on 06/10/2018 at 9:45 AM EDT The patient has been notified of the result and verbalized understanding. All questions (if any) were answered. Frederik Schmidt, RN 06/10/2018 9:45 AM

## 2018-06-16 ENCOUNTER — Other Ambulatory Visit: Payer: Medicare Other

## 2018-06-16 ENCOUNTER — Telehealth: Payer: Self-pay

## 2018-06-16 NOTE — Telephone Encounter (Signed)
Follow up    Pt is calling back and says she will be at home later and will call back with the BP reading

## 2018-06-16 NOTE — Telephone Encounter (Signed)
I was calling the patient to get a BP reading following medication changes.  I left her a message to call and just leave [place] an update.

## 2018-06-17 ENCOUNTER — Telehealth: Payer: Self-pay

## 2018-06-17 NOTE — Telephone Encounter (Signed)
Left patient a message requesting her to check a BP since the start of Losartan.

## 2018-06-19 NOTE — Telephone Encounter (Signed)
lpmtcb and give [place] a BP reading since the start of her Losartan. 4/30

## 2018-08-14 ENCOUNTER — Emergency Department (HOSPITAL_COMMUNITY)
Admission: EM | Admit: 2018-08-14 | Discharge: 2018-08-14 | Disposition: A | Discharge status: Home / Self Care | Payer: Medicare Other | Attending: Emergency Medicine | Admitting: Emergency Medicine

## 2018-08-14 ENCOUNTER — Emergency Department (HOSPITAL_COMMUNITY): Payer: Medicare Other

## 2018-08-14 ENCOUNTER — Encounter (HOSPITAL_COMMUNITY): Payer: Self-pay | Admitting: Emergency Medicine

## 2018-08-14 ENCOUNTER — Other Ambulatory Visit: Payer: Self-pay

## 2018-08-14 DIAGNOSIS — R0602 Shortness of breath: Secondary | ICD-10-CM | POA: Insufficient documentation

## 2018-08-14 DIAGNOSIS — R531 Weakness: Secondary | ICD-10-CM | POA: Insufficient documentation

## 2018-08-14 DIAGNOSIS — R5383 Other fatigue: Secondary | ICD-10-CM | POA: Diagnosis present

## 2018-08-14 DIAGNOSIS — I2510 Atherosclerotic heart disease of native coronary artery without angina pectoris: Secondary | ICD-10-CM | POA: Insufficient documentation

## 2018-08-14 DIAGNOSIS — Z79899 Other long term (current) drug therapy: Secondary | ICD-10-CM | POA: Diagnosis not present

## 2018-08-14 DIAGNOSIS — Z8673 Personal history of transient ischemic attack (TIA), and cerebral infarction without residual deficits: Secondary | ICD-10-CM | POA: Insufficient documentation

## 2018-08-14 DIAGNOSIS — Z7982 Long term (current) use of aspirin: Secondary | ICD-10-CM | POA: Insufficient documentation

## 2018-08-14 DIAGNOSIS — E119 Type 2 diabetes mellitus without complications: Secondary | ICD-10-CM | POA: Diagnosis not present

## 2018-08-14 DIAGNOSIS — Z955 Presence of coronary angioplasty implant and graft: Secondary | ICD-10-CM | POA: Insufficient documentation

## 2018-08-14 DIAGNOSIS — Z7901 Long term (current) use of anticoagulants: Secondary | ICD-10-CM | POA: Diagnosis not present

## 2018-08-14 DIAGNOSIS — I10 Essential (primary) hypertension: Secondary | ICD-10-CM | POA: Diagnosis not present

## 2018-08-14 LAB — CBC WITH DIFFERENTIAL/PLATELET
Abs Immature Granulocytes: 0.02 10*3/uL (ref 0.00–0.07)
Basophils Absolute: 0 10*3/uL (ref 0.0–0.1)
Basophils Relative: 1 %
Eosinophils Absolute: 0.1 10*3/uL (ref 0.0–0.5)
Eosinophils Relative: 2 %
HCT: 45.7 % (ref 36.0–46.0)
Hemoglobin: 15.2 g/dL — ABNORMAL HIGH (ref 12.0–15.0)
Immature Granulocytes: 0 %
Lymphocytes Relative: 27 %
Lymphs Abs: 1.7 10*3/uL (ref 0.7–4.0)
MCH: 31.5 pg (ref 26.0–34.0)
MCHC: 33.3 g/dL (ref 30.0–36.0)
MCV: 94.8 fL (ref 80.0–100.0)
Monocytes Absolute: 0.5 10*3/uL (ref 0.1–1.0)
Monocytes Relative: 8 %
Neutro Abs: 3.9 10*3/uL (ref 1.7–7.7)
Neutrophils Relative %: 62 %
Platelets: 153 10*3/uL (ref 150–400)
RBC: 4.82 MIL/uL (ref 3.87–5.11)
RDW: 12.3 % (ref 11.5–15.5)
WBC: 6.2 10*3/uL (ref 4.0–10.5)
nRBC: 0 % (ref 0.0–0.2)

## 2018-08-14 LAB — BASIC METABOLIC PANEL
Anion gap: 11 (ref 5–15)
BUN: 11 mg/dL (ref 8–23)
CO2: 22 mmol/L (ref 22–32)
Calcium: 8.7 mg/dL — ABNORMAL LOW (ref 8.9–10.3)
Chloride: 108 mmol/L (ref 98–111)
Creatinine, Ser: 0.58 mg/dL (ref 0.44–1.00)
GFR calc Af Amer: >60 mL/min (ref >60)
GFR calc non Af Amer: >60 mL/min (ref >60)
Glucose, Bld: 268 mg/dL — ABNORMAL HIGH (ref 70–99)
Potassium: 3.7 mmol/L (ref 3.5–5.1)
Sodium: 141 mmol/L (ref 135–145)

## 2018-08-14 LAB — TROPONIN I (HIGH SENSITIVITY): Troponin I (High Sensitivity): 8 ng/L (ref <18)

## 2018-08-14 MED ORDER — SODIUM CHLORIDE 0.9 % IV BOLUS
500.0000 mL | Freq: Once | INTRAVENOUS | Status: AC
  Administered 2018-08-14: 500 mL via INTRAVENOUS

## 2018-08-14 MED ORDER — FUROSEMIDE 10 MG/ML IJ SOLN: 40.0000 mg | Freq: Once | INTRAMUSCULAR | Status: DC

## 2018-08-14 NOTE — Discharge Instructions (Signed)
Follow up with your family doc.  Return for chest pain, shortness of breath.

## 2018-08-14 NOTE — ED Provider Notes (Signed)
[facility] EMERGENCY DEPARTMENT Provider Note   CSN: [id number] Arrival date & time: 08/14/18  1304    History   Chief Complaint Chief Complaint  Patient presents with  . Shortness of Breath    HPI Melanie Lucas is a 68 y.o. female.     68 yo F with a chief complaints of generalized fatigue.  Going on this morning when she woke up.  Patient has a history of atrial fibrillation and recently had a cardioversion.  She thought that this felt like when she had A. fib in the past.  Her son is a paramedic and he evaluated her at home and felt that something was not right and sent her to the ED for evaluation.  Patient denies shortness of breath denies chest pain denies nausea vomiting or diarrhea.  States she is been eating and drinking normally.  Denies fever denies headache.  The history is provided by the patient.  Shortness of Breath Associated symptoms: no chest pain, no fever, no headaches, no vomiting and no wheezing   Illness Severity:  Moderate Onset quality:  Gradual Duration:  1 day Timing:  Constant Progression:  Unchanged Chronicity:  New Associated symptoms: shortness of breath   Associated symptoms: no chest pain, no congestion, no fever, no headaches, no myalgias, no nausea, no rhinorrhea, no vomiting and no wheezing     Past Medical History:  Diagnosis Date  . Anxiety   . Cancer (HCC)    Skin- leg   . Chronic acquired lymphedema    a. R>L;  b. 03/2012 Neg LE U/S for DVT. Legs since age 32  . Complication of anesthesia    woke up during colonoscopy, Lithrostrixpy, Biospy  . COPD (chronic obstructive pulmonary disease) ([place])   . Coronary artery disease   . CVD (cardiovascular disease)   . Deaf, right   . Diabetes mellitus    Type II  . Diabetic neuropathy ([place])   . Dysrhythmia    afib  . Elevated TSH    a. 10/2012 - inst to f/u PCP.  . Full dentures   . Hematuria    a. while on pradaxa,  she reports that she has seen Dr Margie Ege and had  low risk cystoscopy  . History of kidney stones   . Hyperlipidemia   . Hypertension    patient denies  . Incontinence    prior to urinating  . Lacunar infarction ([place])    a. 02/2009 non-acute Lacunar infarct of the right thalamus noted on MRI of brain.  . Left carotid stenosis   . Lymphedema of extremity    had this problem, since she was a teenager. especially seen in R LE  . Meniere disease   . Myocardial infarction ([place])   . Obesity   . Osteoarthritis    cervical & lumbar region, knees, hands cramp also   . PAF with post-termination pauses    a. on dronedarone;  b. CHA2DS2VASc = 5 (HTN, DM, h/o lacunar infarct on MRI, Female) ->refused oral anticoagulation after h/o hematuria on pradaxa;  c. 02/2012 Echo: EF 55-60%, mildly dil LA. D. Recurrent PAF 10/2012 after only taking Multaq 1x/day - spont conv to NSR, placed back on BID Multaq/eliquis;  e. 10/2014 Multaq d/c'd->tikosyn initiated.  . Shortness of breath dyspnea    with exertion  . Stroke [facility])    2 mini strokes  . Tobacco abuse   . Vision abnormalities     Patient Active Problem List  Diagnosis Date Noted  . Stroke-like episode ([place]) 01/21/2018  . Acute metabolic encephalopathy 97/35/3299  . Depression 12/31/2017  . Stroke ([place]) 12/31/2017  . COPD (chronic obstructive pulmonary disease) ([place]) 12/30/2017  . Left carotid artery stenosis 07/29/2017  . TIA (transient ischemic attack) 06/24/2017  . Ischemic stroke ([place]) 06/23/2017  . Cervical spondylosis with myelopathy 02/21/2016  . Bilateral leg edema 11/24/2014  . Cervical stenosis of spine 11/24/2014  . Numbness of fingers of both hands 11/24/2014  . HLD (hyperlipidemia) 11/09/2014  . Diabetes mellitus without complication ([place]) 24/26/8341  . Pulmonary hypertension ([place]) 08/07/2013  . CAD (coronary artery disease) 03/11/2013  . Peripheral edema 02/25/2012  . Essential hypertension, benign 05/06/2009  . Overweight 04/13/2009  . TOBACCO ABUSE 04/13/2009  .  Paroxysmal atrial fibrillation ([place]) 04/13/2009  . CHEST PAIN, ATYPICAL 04/13/2009    Past Surgical History:  Procedure Laterality Date  . ANTERIOR CERVICAL CORPECTOMY N/A 02/21/2016   Procedure: ANTERIOR CERVICAL CORPECTOMY ANS FUSION CERVICAL SIX , ANTERIOR PLATING CERVICAL FIVE-SEVEN;  Surgeon: Consuella Lose, MD;  Location: [facility];  Service: Neurosurgery;  Laterality: N/A;  . ANTERIOR FUSION CERVICAL SPINE  02/21/2016  . Biopsy of adrenal glands    . BREAST BIOPSY Bilateral    benign results, both breasts   . CARDIAC CATHETERIZATION  2014   x1 stent placed, done in [place]   . CARDIOVERSION N/A 11/11/2014   Procedure: CARDIOVERSION;  Surgeon: Larey Dresser, MD;  Location: [facility];  Service: Cardiovascular;  Laterality: N/A;  . CAROTID ENDARTERECTOMY Left 07/29/2017  . COLONOSCOPY W/ POLYPECTOMY    . CORONARY STENT PLACEMENT    . ENDARTERECTOMY Left 07/29/2017   Procedure: ENDARTERECTOMY CAROTID LEFT;  Surgeon: Rosetta Posner, MD;  Location: [facility];  Service: Vascular;  Laterality: Left;  . PATCH ANGIOPLASTY Left 07/29/2017   Procedure: PATCH ANGIOPLASTY USING HEMASHIELD GOLD VASCULAR PATCH;  Surgeon: Rosetta Posner, MD;  Location: [facility];  Service: Vascular;  Laterality: Left;  . TEE WITHOUT CARDIOVERSION N/A 11/11/2014   Procedure: TRANSESOPHAGEAL ECHOCARDIOGRAM (TEE);  Surgeon: Larey Dresser, MD;  Location: [facility] ENDOSCOPY;  Service: Cardiovascular;  Laterality: N/A;     OB History   No obstetric history on file.      Home Medications    Prior to Admission medications   Medication Sig Start Date End Date Taking? Authorizing Provider  albuterol (PROVENTIL HFA;VENTOLIN HFA) 108 (90 Base) MCG/ACT inhaler Inhale 2 puffs into the lungs every 6 (six) hours as needed for wheezing or shortness of breath.     [provider]  aspirin EC 81 MG tablet Take 1 tablet (81 mg total) by mouth daily. 06/24/17   Caren Griffins, MD  dofetilide (TIKOSYN) 500 MCG capsule TAKE 1  CAPSULE (500 MCG TOTAL) 2 (TWO) TIMES DAILY BY MOUTH. 01/07/18   Allred, Jeneen Rinks, MD  DULOXETINE HCL PO Take 40 mg by mouth daily.     [provider]  ELIQUIS 5 MG TABS tablet TAKE 1 TABLET BY MOUTH TWICE A DAY 03/17/18   Dorothy Spark, MD  Empagliflozin-metFORMIN HCl (SYNJARDY) 12.06-998 MG TABS Take 1 tablet by mouth 2 (two) times daily.    [provider]  furosemide (LASIX) 20 MG tablet Take 20 mg by mouth as needed for edema.     [provider]  ibuprofen (ADVIL,MOTRIN) 800 MG tablet Take 800 mg by mouth 3 (three) times daily as needed for mild pain.     [provider]  losartan (COZAAR) 25  MG tablet Take 1 tablet (25 mg total) by mouth daily. 05/30/18 08/28/18  Consuelo Pandy, PA-C  Multiple Vitamin (MULTIVITAMIN) tablet Take 1 tablet by mouth at bedtime.     [provider]  repaglinide (PRANDIN) 1 MG tablet Take 1 mg by mouth 3 (three) times daily before meals.    [provider]  rosuvastatin (CRESTOR) 5 MG tablet Take 5 mg by mouth at bedtime.     [provider]    Family History Family History  Problem Relation Age of Onset  . Heart attack Father   . Coronary artery disease Father        strong family hx  . Bladder Cancer Mother        bladder  . Leukemia Brother   . Breast cancer Maternal Aunt   . Breast cancer Paternal Aunt   . Breast cancer Maternal Aunt     Social History Social History   Tobacco Use  . Smoking status: Current Every Day Smoker    Packs/day: 1.00    Years: 55.00    Pack years: 55.00    Types: Cigarettes  . Smokeless tobacco: Never Used  . Tobacco comment: pt has patches   Substance Use Topics  . Alcohol use: Yes    Comment: 1 beer per month  . Drug use: No     Allergies   Avelox [moxifloxacin], Chantix [varenicline], Fosamax [alendronate], Pradaxa [dabigatran etexilate mesylate], Statins, Sulfamethoxazole, Sulfonamide derivatives, Ciprofloxacin, Gabapentin, and Tape    Review of Systems Review of Systems  Constitutional: Negative for chills and fever.  HENT: Negative for congestion and rhinorrhea.   Eyes: Negative for redness and visual disturbance.  Respiratory: Positive for shortness of breath. Negative for wheezing.   Cardiovascular: Negative for chest pain and palpitations.  Gastrointestinal: Negative for nausea and vomiting.  Genitourinary: Negative for dysuria and urgency.  Musculoskeletal: Negative for arthralgias and myalgias.  Skin: Negative for pallor and wound.  Neurological: Negative for dizziness and headaches.     Physical Exam Updated Vital Signs BP (!) 156/70   Pulse (!) 53   Temp 98.6 F (37 C) (Oral)   Resp (!) 24   Ht 5\' 7"  (1.702 m)   Wt 73.9 kg   SpO2 93%   BMI 25.53 kg/m   Physical Exam Vitals signs and nursing note reviewed.  Constitutional:      General: She is not in acute distress.    Appearance: She is well-developed. She is not diaphoretic.  HENT:     Head: Normocephalic and atraumatic.  Eyes:     Pupils: Pupils are equal, round, and reactive to light.  Neck:     Musculoskeletal: Normal range of motion and neck supple.  Cardiovascular:     Rate and Rhythm: Normal rate and regular rhythm.     Heart sounds: No murmur. No friction rub. No gallop.   Pulmonary:     Effort: Pulmonary effort is normal.     Breath sounds: No wheezing or rales.  Abdominal:     General: There is no distension.     Palpations: Abdomen is soft.     Tenderness: There is no abdominal tenderness.  Musculoskeletal:        General: No tenderness.  Skin:    General: Skin is warm and dry.  Neurological:     Mental Status: She is alert and oriented to person, place, and time.  Psychiatric:        Behavior: Behavior normal.  ED Treatments / Results  Labs (all labs ordered are listed, but only abnormal results are displayed) Labs Reviewed  CBC WITH DIFFERENTIAL/PLATELET - Abnormal; Notable for the following components:       Result Value   Hemoglobin 15.2 (*)    All other components within normal limits  BASIC METABOLIC PANEL - Abnormal; Notable for the following components:   Glucose, Bld 268 (*)    Calcium 8.7 (*)    All other components within normal limits  TROPONIN I (HIGH SENSITIVITY)  TROPONIN I (HIGH SENSITIVITY)    EKG EKG Interpretation  Date/Time:  Thursday August 14 2018 13:08:18 EDT Ventricular Rate:  52 PR Interval:    QRS Duration: 131 QT Interval:  501 QTC Calculation: 466 R Axis:   -84 Text Interpretation:  Sinus rhythm Atrial premature complex RBBB and LAFB No significant change since last tracing Confirmed by Deno Etienne [phone]) on 08/14/2018 1:56:47 PM   Radiology Dg Chest 2 View  Result Date: 08/14/2018 CLINICAL DATA:  Fatigue and shortness of breath today. EXAM: CHEST - 2 VIEW COMPARISON:  January 10, 2018 FINDINGS: The heart size and mediastinal contours are stable. Both lungs are clear. The visualized skeletal structures are stable. Patient status post prior fixation of lower cervical spine. IMPRESSION: No active cardiopulmonary disease. Electronically Signed   By: Abelardo Diesel M.D.   On: 08/14/2018 14:23    Procedures Procedures (including critical care time)  Medications Ordered in ED Medications  sodium chloride 0.9 % bolus 500 mL (500 mLs Intravenous New Bag/Given 08/14/18 1425)     Initial Impression / Assessment and Plan / ED Course  I have reviewed the triage vital signs and the nursing notes.  Pertinent labs & imaging results that were available during my care of the patient were reviewed by me and considered in my medical decision making (see chart for details).        68 yo F with a chief complaint of generalized fatigue.  Going on for the past day.  She is well-appearing and nontoxic.  Will obtain a basic laboratory evaluation give a bolus of IV fluids and reassess.  Labwork unremarkable.  D/c home.   3:10 PM:  I have discussed the  diagnosis/risks/treatment options with the patient and believe the pt to be eligible for discharge home to follow-up with PCP. We also discussed returning to the ED immediately if new or worsening sx occur. We discussed the sx which are most concerning (e.g., sudden worsening pain, fever, inability to tolerate by mouth) that necessitate immediate return. Medications administered to the patient during their visit and any new prescriptions provided to the patient are listed below.  Medications given during this visit Medications  sodium chloride 0.9 % bolus 500 mL (500 mLs Intravenous New Bag/Given 08/14/18 1425)     The patient appears reasonably screen and/or stabilized for discharge and I doubt any other medical condition or other [facility] requiring further screening, evaluation, or treatment in the ED at this time prior to discharge.    Final Clinical Impressions(s) / ED Diagnoses   Final diagnoses:  Weakness    ED Discharge Orders    None       Deno Etienne, DO 08/14/18 1511

## 2018-08-14 NOTE — ED Notes (Signed)
Pt verbalized understanding of d/c instructions and has no further questions, VSS, NAD.  

## 2018-08-14 NOTE — ED Triage Notes (Signed)
Pt arrives via EMS from home with reports of sudden SOB that started today. Denies any new leg swelling.

## 2018-08-27 ENCOUNTER — Other Ambulatory Visit: Payer: Self-pay

## 2018-08-27 ENCOUNTER — Encounter (HOSPITAL_BASED_OUTPATIENT_CLINIC_OR_DEPARTMENT_OTHER): Payer: Self-pay

## 2018-08-27 ENCOUNTER — Emergency Department (HOSPITAL_BASED_OUTPATIENT_CLINIC_OR_DEPARTMENT_OTHER): Payer: Medicare Other

## 2018-08-27 ENCOUNTER — Emergency Department (HOSPITAL_BASED_OUTPATIENT_CLINIC_OR_DEPARTMENT_OTHER)
Admission: EM | Admit: 2018-08-27 | Discharge: 2018-08-27 | Disposition: A | Discharge status: Home / Self Care | Payer: Medicare Other | Attending: Emergency Medicine | Admitting: Emergency Medicine

## 2018-08-27 DIAGNOSIS — R319 Hematuria, unspecified: Secondary | ICD-10-CM

## 2018-08-27 DIAGNOSIS — Z7982 Long term (current) use of aspirin: Secondary | ICD-10-CM | POA: Diagnosis not present

## 2018-08-27 DIAGNOSIS — R1032 Left lower quadrant pain: Secondary | ICD-10-CM | POA: Diagnosis not present

## 2018-08-27 DIAGNOSIS — Z7901 Long term (current) use of anticoagulants: Secondary | ICD-10-CM | POA: Diagnosis not present

## 2018-08-27 DIAGNOSIS — J449 Chronic obstructive pulmonary disease, unspecified: Secondary | ICD-10-CM | POA: Insufficient documentation

## 2018-08-27 DIAGNOSIS — E1140 Type 2 diabetes mellitus with diabetic neuropathy, unspecified: Secondary | ICD-10-CM | POA: Insufficient documentation

## 2018-08-27 DIAGNOSIS — I10 Essential (primary) hypertension: Secondary | ICD-10-CM | POA: Insufficient documentation

## 2018-08-27 DIAGNOSIS — Z79899 Other long term (current) drug therapy: Secondary | ICD-10-CM | POA: Diagnosis not present

## 2018-08-27 DIAGNOSIS — F17210 Nicotine dependence, cigarettes, uncomplicated: Secondary | ICD-10-CM | POA: Diagnosis not present

## 2018-08-27 DIAGNOSIS — Z87442 Personal history of urinary calculi: Secondary | ICD-10-CM | POA: Insufficient documentation

## 2018-08-27 LAB — CBC WITH DIFFERENTIAL/PLATELET
Abs Immature Granulocytes: 0.02 10*3/uL (ref 0.00–0.07)
Basophils Absolute: 0 10*3/uL (ref 0.0–0.1)
Basophils Relative: 1 %
Eosinophils Absolute: 0.1 10*3/uL (ref 0.0–0.5)
Eosinophils Relative: 2 %
HCT: 45.3 % (ref 36.0–46.0)
Hemoglobin: 15 g/dL (ref 12.0–15.0)
Immature Granulocytes: 0 %
Lymphocytes Relative: 34 %
Lymphs Abs: 2.3 10*3/uL (ref 0.7–4.0)
MCH: 31.2 pg (ref 26.0–34.0)
MCHC: 33.1 g/dL (ref 30.0–36.0)
MCV: 94.2 fL (ref 80.0–100.0)
Monocytes Absolute: 0.5 10*3/uL (ref 0.1–1.0)
Monocytes Relative: 7 %
Neutro Abs: 3.9 10*3/uL (ref 1.7–7.7)
Neutrophils Relative %: 56 %
Platelets: 181 10*3/uL (ref 150–400)
RBC: 4.81 MIL/uL (ref 3.87–5.11)
RDW: 12.3 % (ref 11.5–15.5)
WBC: 6.8 10*3/uL (ref 4.0–10.5)
nRBC: 0 % (ref 0.0–0.2)

## 2018-08-27 LAB — BASIC METABOLIC PANEL
Anion gap: 10 (ref 5–15)
BUN: 15 mg/dL (ref 8–23)
CO2: 28 mmol/L (ref 22–32)
Calcium: 9 mg/dL (ref 8.9–10.3)
Chloride: 99 mmol/L (ref 98–111)
Creatinine, Ser: 0.69 mg/dL (ref 0.44–1.00)
GFR calc Af Amer: >60 mL/min (ref >60)
GFR calc non Af Amer: >60 mL/min (ref >60)
Glucose, Bld: 223 mg/dL — ABNORMAL HIGH (ref 70–99)
Potassium: 3.6 mmol/L (ref 3.5–5.1)
Sodium: 137 mmol/L (ref 135–145)

## 2018-08-27 LAB — URINALYSIS, ROUTINE W REFLEX MICROSCOPIC
Bilirubin Urine: NEGATIVE
Glucose, UA: >=500 mg/dL — AB
Ketones, ur: NEGATIVE mg/dL
Leukocytes,Ua: NEGATIVE
Nitrite: NEGATIVE
Protein, ur: NEGATIVE mg/dL
Specific Gravity, Urine: 1.015 (ref 1.005–1.030)
pH: 7 (ref 5.0–8.0)

## 2018-08-27 LAB — URINALYSIS, MICROSCOPIC (REFLEX): RBC / HPF: >50 RBC/hpf (ref 0–5)

## 2018-08-27 MED ORDER — SODIUM CHLORIDE 0.9 % IV BOLUS
1000.0000 mL | Freq: Once | INTRAVENOUS | Status: AC
  Administered 2018-08-27: 1000 mL via INTRAVENOUS

## 2018-08-27 MED ORDER — FENTANYL CITRATE (PF) 100 MCG/2ML IJ SOLN
50.0000 ug | Freq: Once | INTRAMUSCULAR | Status: AC
  Administered 2018-08-27: 50 ug via INTRAVENOUS
  Filled 2018-08-27: qty 2

## 2018-08-27 MED ORDER — ONDANSETRON HCL 4 MG/2ML IJ SOLN
4.0000 mg | Freq: Once | INTRAMUSCULAR | Status: AC
  Administered 2018-08-27: 4 mg via INTRAVENOUS
  Filled 2018-08-27: qty 2

## 2018-08-27 NOTE — Discharge Instructions (Signed)
Continue Tylenol for pain.  Follow-up with your urologist as scheduled.

## 2018-08-27 NOTE — ED Triage Notes (Signed)
Pt c/o blood in urine x 2 moths-states she has been seen x 3-last just PTA with pos blood in urine-pt c/o pain to bilat flank pain-NAD-steady gait

## 2018-08-27 NOTE — ED Notes (Signed)
Patient transported to CT 

## 2018-08-27 NOTE — ED Provider Notes (Signed)
[facility] EMERGENCY DEPARTMENT Provider Note   CSN: [id number] Arrival date & time: 08/27/18  1451    History   Chief Complaint Chief Complaint  Patient presents with   Hematuria    HPI Melanie Lucas is a 68 y.o. female.     Patient concerned about kidney stones.  Left-sided flank pain, hematuria started yesterday.  The history is provided by the patient.  Flank Pain This is a recurrent problem. The current episode started 2 days ago. The problem occurs constantly. Pertinent negatives include no chest pain, no abdominal pain, no headaches and no shortness of breath. Nothing aggravates the symptoms. Nothing relieves the symptoms. She has tried nothing for the symptoms. The treatment provided no relief.    Past Medical History:  Diagnosis Date   Anxiety    Cancer ([place])    Skin- leg    Chronic acquired lymphedema    a. R>L;  b. 03/2012 Neg LE U/S for DVT. Legs since age 42   Complication of anesthesia    woke up during colonoscopy, Lithrostrixpy, Biospy   COPD (chronic obstructive pulmonary disease) ([place])    Coronary artery disease    CVD (cardiovascular disease)    Deaf, right    Diabetes mellitus    Type II   Diabetic neuropathy ([place])    Dysrhythmia    afib   Elevated TSH    a. 10/2012 - inst to f/u PCP.   Full dentures    Hematuria    a. while on pradaxa,  she reports that she has seen Dr Margie Ege and had low risk cystoscopy   History of kidney stones    Hyperlipidemia    Hypertension    patient denies   Incontinence    prior to urinating   Lacunar infarction [facility])    a. 02/2009 non-acute Lacunar infarct of the right thalamus noted on MRI of brain.   Left carotid stenosis    Lymphedema of extremity    had this problem, since she was a teenager. especially seen in R LE   Meniere disease    Myocardial infarction [facility])    Obesity    Osteoarthritis    cervical & lumbar region, knees, hands cramp also    PAF with  post-termination pauses    a. on dronedarone;  b. CHA2DS2VASc = 5 (HTN, DM, h/o lacunar infarct on MRI, Female) ->refused oral anticoagulation after h/o hematuria on pradaxa;  c. 02/2012 Echo: EF 55-60%, mildly dil LA. D. Recurrent PAF 10/2012 after only taking Multaq 1x/day - spont conv to NSR, placed back on BID Multaq/eliquis;  e. 10/2014 Multaq d/c'd->tikosyn initiated.   Shortness of breath dyspnea    with exertion   Stroke ([place])    2 mini strokes   Tobacco abuse    Vision abnormalities     Patient Active Problem List   Diagnosis Date Noted   Stroke-like episode ([place]) 44/02/270   Acute metabolic encephalopathy 53/66/4403   Depression 12/31/2017   Stroke ([place]) 12/31/2017   COPD (chronic obstructive pulmonary disease) ([place]) 12/30/2017   Left carotid artery stenosis 07/29/2017   TIA (transient ischemic attack) 06/24/2017   Ischemic stroke ([place]) 06/23/2017   Cervical spondylosis with myelopathy 02/21/2016   Bilateral leg edema 11/24/2014   Cervical stenosis of spine 11/24/2014   Numbness of fingers of both hands 11/24/2014   HLD (hyperlipidemia) 11/09/2014   Diabetes mellitus without complication ([place]) 47/42/5956   Pulmonary hypertension ([place]) 08/07/2013   CAD (coronary artery disease) 03/11/2013  Peripheral edema 02/25/2012   Essential hypertension, benign 05/06/2009   Overweight 04/13/2009   TOBACCO ABUSE 04/13/2009   Paroxysmal atrial fibrillation ([place]) 04/13/2009   CHEST PAIN, ATYPICAL 04/13/2009    Past Surgical History:  Procedure Laterality Date   ANTERIOR CERVICAL CORPECTOMY N/A 02/21/2016   Procedure: ANTERIOR CERVICAL CORPECTOMY ANS FUSION CERVICAL SIX , ANTERIOR PLATING CERVICAL FIVE-SEVEN;  Surgeon: Consuella Lose, MD;  Location: [facility];  Service: Neurosurgery;  Laterality: N/A;   ANTERIOR FUSION CERVICAL SPINE  02/21/2016   Biopsy of adrenal glands     BREAST BIOPSY Bilateral    benign results, both breasts    CARDIAC  CATHETERIZATION  2014   x1 stent placed, done in [place]    CARDIOVERSION N/A 11/11/2014   Procedure: CARDIOVERSION;  Surgeon: Larey Dresser, MD;  Location: [place] [facility];  Service: Cardiovascular;  Laterality: N/A;   CAROTID ENDARTERECTOMY Left 07/29/2017   COLONOSCOPY W/ POLYPECTOMY     CORONARY STENT PLACEMENT     ENDARTERECTOMY Left 07/29/2017   Procedure: ENDARTERECTOMY CAROTID LEFT;  Surgeon: Rosetta Posner, MD;  Location: [facility];  Service: Vascular;  Laterality: Left;   PATCH ANGIOPLASTY Left 07/29/2017   Procedure: PATCH ANGIOPLASTY USING HEMASHIELD GOLD VASCULAR PATCH;  Surgeon: Rosetta Posner, MD;  Location: [facility];  Service: Vascular;  Laterality: Left;   TEE WITHOUT CARDIOVERSION N/A 11/11/2014   Procedure: TRANSESOPHAGEAL ECHOCARDIOGRAM (TEE);  Surgeon: Larey Dresser, MD;  Location: [facility] ENDOSCOPY;  Service: Cardiovascular;  Laterality: N/A;     OB History   No obstetric history on file.      Home Medications    Prior to Admission medications   Medication Sig Start Date End Date Taking? Authorizing Provider  albuterol (PROVENTIL HFA;VENTOLIN HFA) 108 (90 Base) MCG/ACT inhaler Inhale 2 puffs into the lungs every 6 (six) hours as needed for wheezing or shortness of breath.     [provider]  aspirin EC 81 MG tablet Take 1 tablet (81 mg total) by mouth daily. 06/24/17   Caren Griffins, MD  dofetilide (TIKOSYN) 500 MCG capsule TAKE 1 CAPSULE (500 MCG TOTAL) 2 (TWO) TIMES DAILY BY MOUTH. 01/07/18   Allred, Jeneen Rinks, MD  DULOXETINE HCL PO Take 40 mg by mouth daily.     [provider]  ELIQUIS 5 MG TABS tablet TAKE 1 TABLET BY MOUTH TWICE A DAY 03/17/18   Dorothy Spark, MD  Empagliflozin-metFORMIN HCl (SYNJARDY) 12.06-998 MG TABS Take 1 tablet by mouth 2 (two) times daily.    [provider]  furosemide (LASIX) 20 MG tablet Take 20 mg by mouth as needed for edema.     [provider]  ibuprofen (ADVIL,MOTRIN) 800 MG tablet Take 800 mg  by mouth 3 (three) times daily as needed for mild pain.     [provider]  losartan (COZAAR) 25 MG tablet Take 1 tablet (25 mg total) by mouth daily. 05/30/18 08/28/18  Consuelo Pandy, PA-C  Multiple Vitamin (MULTIVITAMIN) tablet Take 1 tablet by mouth at bedtime.     [provider]  repaglinide (PRANDIN) 1 MG tablet Take 1 mg by mouth 3 (three) times daily before meals.    [provider]  rosuvastatin (CRESTOR) 5 MG tablet Take 5 mg by mouth at bedtime.     [provider]    Family History Family History  Problem Relation Age of Onset   Heart attack Father    Coronary artery disease Father  strong family hx   Bladder Cancer Mother        bladder   Leukemia Brother    Breast cancer Maternal Aunt    Breast cancer Paternal Aunt    Breast cancer Maternal Aunt     Social History Social History   Tobacco Use   Smoking status: Current Every Day Smoker    Packs/day: 1.00    Years: 55.00    Pack years: 55.00    Types: Cigarettes   Smokeless tobacco: Never Used  Substance Use Topics   Alcohol use: Not Currently   Drug use: No     Allergies   Avelox [moxifloxacin], Chantix [varenicline], Fosamax [alendronate], Pradaxa [dabigatran etexilate mesylate], Statins, Sulfamethoxazole, Sulfonamide derivatives, Ciprofloxacin, Gabapentin, and Tape   Review of Systems Review of Systems  Constitutional: Negative for chills and fever.  HENT: Negative for ear pain and sore throat.   Eyes: Negative for pain and visual disturbance.  Respiratory: Negative for cough and shortness of breath.   Cardiovascular: Negative for chest pain and palpitations.  Gastrointestinal: Negative for abdominal pain and vomiting.  Genitourinary: Positive for flank pain and hematuria. Negative for dysuria.  Musculoskeletal: Negative for arthralgias and back pain.  Skin: Negative for color change and rash.  Neurological: Negative for seizures, syncope and  headaches.  All other systems reviewed and are negative.    Physical Exam Updated Vital Signs BP (!) 146/76 (BP Location: Right Arm)    Pulse (!) 51    Temp 98.4 F (36.9 C) (Oral)    Resp 20    SpO2 99%   Physical Exam Vitals signs and nursing note reviewed.  Constitutional:      General: She is not in acute distress.    Appearance: She is well-developed. She is not ill-appearing.  HENT:     Head: Normocephalic and atraumatic.  Eyes:     Conjunctiva/sclera: Conjunctivae normal.  Neck:     Musculoskeletal: Normal range of motion and neck supple.  Cardiovascular:     Rate and Rhythm: Normal rate and regular rhythm.     Heart sounds: No murmur.  Pulmonary:     Effort: Pulmonary effort is normal. No respiratory distress.     Breath sounds: Normal breath sounds.  Abdominal:     General: Abdomen is flat.     Palpations: Abdomen is soft.     Tenderness: There is no abdominal tenderness. There is left CVA tenderness. There is no right CVA tenderness.  Musculoskeletal: Normal range of motion.  Skin:    General: Skin is warm and dry.     Capillary Refill: Capillary refill takes less than 2 seconds.  Neurological:     General: No focal deficit present.     Mental Status: She is alert.  Psychiatric:        Mood and Affect: Mood normal.      ED Treatments / Results  Labs (all labs ordered are listed, but only abnormal results are displayed) Labs Reviewed  URINALYSIS, ROUTINE W REFLEX MICROSCOPIC - Abnormal; Notable for the following components:      Result Value   APPearance TURBID (*)    Glucose, UA >=500 (*)    Hgb urine dipstick LARGE (*)    All other components within normal limits  BASIC METABOLIC PANEL - Abnormal; Notable for the following components:   Glucose, Bld 223 (*)    All other components within normal limits  URINALYSIS, MICROSCOPIC (REFLEX) - Abnormal; Notable for the following components:   Bacteria,  UA FEW (*)    All other components within normal  limits  CBC WITH DIFFERENTIAL/PLATELET    EKG None  Radiology Ct Renal Stone Study  Result Date: 08/27/2018 CLINICAL DATA:  Left-sided flank pain, hematuria EXAM: CT ABDOMEN AND PELVIS WITHOUT CONTRAST TECHNIQUE: Multidetector CT imaging of the abdomen and pelvis was performed following the standard protocol without IV contrast. COMPARISON:  04/17/2016 FINDINGS: Lower chest: No acute abnormality.  Coronary artery calcifications. Hepatobiliary: No solid liver abnormality is seen. No gallstones, gallbladder wall thickening, or biliary dilatation. Pancreas: Unremarkable. No pancreatic ductal dilatation or surrounding inflammatory changes. Spleen: Normal in size without significant abnormality. Adrenals/Urinary Tract: Redemonstrated large, bilateral, fat containing benign adrenal adenomas. Punctuate nonobstructive calculus of the inferior pole of the right kidney (series 2, image 38). No evidence of ureteral calculus or hydronephrosis. Bladder is unremarkable. Stomach/Bowel: Stomach is within normal limits. Appendix appears normal. No evidence of bowel wall thickening, distention, or inflammatory changes. Vascular/Lymphatic: Aortic atherosclerosis. No enlarged abdominal or pelvic lymph nodes. Reproductive: No mass or other significant abnormality. Other: No abdominal wall hernia or abnormality. No abdominopelvic ascites. Musculoskeletal: There is a sclerotic Schmorl type superior endplate deformity of the L2 vertebral body, new compared to prior examination dated 04/17/2016 although age indeterminate. IMPRESSION: 1. Punctuate nonobstructive calculus of the inferior pole of the right kidney (series 2, image 38). No evidence of ureteral calculus or hydronephrosis. Consider multiphasic contrast enhanced CT urogram and/or direct cystoscopic visualization to further evaluate unexplained hematuria. 2. There is a sclerotic Schmorl type superior endplate deformity of the L2 vertebral body, new compared to prior  examination dated 04/17/2016 although age indeterminate. Correlate for acute pain and point tenderness. Electronically Signed   By: Eddie Candle M.D.   On: 08/27/2018 15:35    Procedures Procedures (including critical care time)  Medications Ordered in ED Medications  fentaNYL (SUBLIMAZE) injection 50 mcg (50 mcg Intravenous Given 08/27/18 1526)  sodium chloride 0.9 % bolus 1,000 mL (1,000 mLs Intravenous New Bag/Given 08/27/18 1525)  ondansetron (ZOFRAN) injection 4 mg (4 mg Intravenous Given 08/27/18 1526)     Initial Impression / Assessment and Plan / ED Course  I have reviewed the triage vital signs and the nursing notes.  Pertinent labs & imaging results that were available during my care of the patient were reviewed by me and considered in my medical decision making (see chart for details).     Melanie Lucas is a 67 year old female history of kidney stones who presents to the ED with flank pain, hematuria.  Patient with normal vitals.  No fever.  Left CVA tenderness on exam.  Feels like prior kidney stones.  However patient overall appears comfortable.  Will obtain blood work, CT scan.  History and physical not consistent with obstruction or appendicitis or diverticulitis.  Patient given IV fluids, IV fentanyl, IV Zofran.  Will reevaluate.  Patient with no significant anemia, electrolyte abnormality, leukocytosis.  CT scan overall unremarkable.  There are some nonobstructive stones but nothing in the urethra.  Patient does not have any back pain.  Recommend that patient follow-up with urologist as she has scheduled already to further investigate intermittent hematuria.  Does have a smoking history.  Patient otherwise with unremarkable labs.  Discharged in ED in good condition.  Given return precautions.  This chart was dictated using voice recognition software.  Despite best efforts to proofread,  errors can occur which can change the documentation meaning.   Final Clinical Impressions(s) /  ED Diagnoses  Final diagnoses:  Hematuria, unspecified type    ED Discharge Orders    None       Lennice Sites, DO 08/27/18 1552

## 2018-09-29 ENCOUNTER — Ambulatory Visit: Payer: Medicare Other | Admitting: Internal Medicine

## 2018-09-30 ENCOUNTER — Other Ambulatory Visit: Payer: Self-pay

## 2018-09-30 ENCOUNTER — Ambulatory Visit (INDEPENDENT_AMBULATORY_CARE_PROVIDER_SITE_OTHER): Payer: Medicare Other | Admitting: Adult Health

## 2018-09-30 VITALS — BP 141/75 | HR 45 | Temp 97.7°F | Ht 67.0 in | Wt 167.0 lb

## 2018-09-30 DIAGNOSIS — E785 Hyperlipidemia, unspecified: Secondary | ICD-10-CM

## 2018-09-30 DIAGNOSIS — I6381 Other cerebral infarction due to occlusion or stenosis of small artery: Secondary | ICD-10-CM | POA: Diagnosis not present

## 2018-09-30 DIAGNOSIS — E1142 Type 2 diabetes mellitus with diabetic polyneuropathy: Secondary | ICD-10-CM | POA: Diagnosis not present

## 2018-09-30 DIAGNOSIS — I482 Chronic atrial fibrillation, unspecified: Secondary | ICD-10-CM

## 2018-09-30 DIAGNOSIS — I10 Essential (primary) hypertension: Secondary | ICD-10-CM | POA: Diagnosis not present

## 2018-09-30 NOTE — Progress Notes (Signed)
[facility] [address] [place]. [place] [address] [phone]       OFFICE FOLLOW-UP NOTE  [name] Date of Birth:  02-14-1951 Medical Record Number:  [id number]   Chief Complaint  Patient presents with   Follow-up    Rm 15, friend   Cerebrovascular Accident    states has pain bil back? can;t fingure out what is causing this.     HPI: Ms.  [name] is a 68 year old Caucasian lady seen today for initial office follow-up visit following hospital admission for recurrent strokes in May, November and December 2019.  History is obtained from the patient, husband, review of electronic medical records and have personally reviewed imaging films.  She has a past medical history of chronic lymphedema right leg, coronary artery disease, COPD, deafness in the right ear, diabetes, diabetic neuropathy, chronic atrial fibrillation, hypertension, hyperlipidemia, right thalamic infarct January 2011, left carotid stenosis status post surgery, myocardial infarction, obesity, osteoarthritis, left brain TIA in May 2019, right coronary radiata infarct in November 2019 and most recent recent right periventricular infarct in December 2019. She presented on 01/21/2018 with weakness in the legs and MRI scan of the brain showed tiny right lateral ventricular periventricular infarct with subtle enhancement.  The MRI scan did not correlate with the symptoms.  She has longstanding history of chronic heavy smoking and has not quit smoking despite being asked to do so.  She did not get a repeat stroke work-up since she had it done previously in the month of November and she was admitted with a right corona radiata infarct.  At that time CT angiogram of the head and neck showed postoperative changes of left carotid surgery with 50% right ICA stenosis.  Transthoracic echo showed normal ejection fraction.  LDL cholesterol was 45 mg percent.  Hemoglobin A1c was elevated at 9.3.  Patient has been on Eliquis  for anticoagulation and has been compliant with it.  She states she is done well since discharge.  She is finished home physical and Occupational Therapy.  She still has some mild subjective left-sided weakness.  She has mild gait ataxia and paresthesias in the feet from diabetic neuropathy which is longstanding.  The patient is reluctant to quit smoking as she states that it is too difficult for her.  She is tolerating Eliquis well without significant bleeding or bruising.  09/30/2018 update: [name] is a 68 year old female who is being seen today for stroke follow-up.  She has been stable from a stroke standpoint without new or worsening stroke/TIA symptoms.  Residual deficits mild left-sided weakness and balance difficulties but denies any worsening.  Continues on Eliquis for atrial fibrillation and aspirin 81 mg for secondary stroke prevention without bleeding or bruising.  Continues on Crestor 5 mg daily without myalgias.  Blood pressure at today's visit 141/75.  She also endorses ongoing morning headache which has been present since her stroke.  Will resolve without intervention.  Located bilateral temples with throbbing type sensation.  Denies photophobia, phonophobia or nausea/vomiting.  She does endorse increased stress with multiple complaints at today's visit for which she is currently being worked up for including upper abdominal/rib pain, bilateral finger numbness which has been present since cervical surgery, and excessive fatigue.  Per patient, she had a negative work-up for MS but she feels as though this still may be a cause of her ongoing symptoms.  She was recently seen in the ED for generalized weakness without abnormal findings and hematuria which has  since resolved.  She continues to follow with urology.  She was also evaluated by ENT for sensorineural hearing loss.  No further concerns at this time.   ROS:   14 system review of systems is positive for see HPI and all other systems  negative   PMH:  Past Medical History:  Diagnosis Date   Anxiety    Cancer ([place])    Skin- leg    Chronic acquired lymphedema    a. R>L;  b. 03/2012 Neg LE U/S for DVT. Legs since age 67   Complication of anesthesia    woke up during colonoscopy, Lithrostrixpy, Biospy   COPD (chronic obstructive pulmonary disease) ([place])    Coronary artery disease    CVD (cardiovascular disease)    Deaf, right    Diabetes mellitus    Type II   Diabetic neuropathy ([place])    Dysrhythmia    afib   Elevated TSH    a. 10/2012 - inst to f/u PCP.   Full dentures    Hematuria    a. while on pradaxa,  she reports that she has seen Dr Margie Ege and had low risk cystoscopy   History of kidney stones    Hyperlipidemia    Hypertension    patient denies   Incontinence    prior to urinating   Lacunar infarction [facility])    a. 02/2009 non-acute Lacunar infarct of the right thalamus noted on MRI of brain.   Left carotid stenosis    Lymphedema of extremity    had this problem, since she was a teenager. especially seen in R LE   Meniere disease    Myocardial infarction [facility])    Obesity    Osteoarthritis    cervical & lumbar region, knees, hands cramp also    PAF with post-termination pauses    a. on dronedarone;  b. CHA2DS2VASc = 5 (HTN, DM, h/o lacunar infarct on MRI, Female) ->refused oral anticoagulation after h/o hematuria on pradaxa;  c. 02/2012 Echo: EF 55-60%, mildly dil LA. D. Recurrent PAF 10/2012 after only taking Multaq 1x/day - spont conv to NSR, placed back on BID Multaq/eliquis;  e. 10/2014 Multaq d/c'd->tikosyn initiated.   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 strain: Not on file   Food insecurity    Worry: Not on file    Inability: Not on Lexicographer needs    Medical: Not on file    Non-medical: Not on file  Tobacco Use   Smoking status: Current Every Day Smoker    Packs/day: 1.00    Years: 55.00    Pack years: 55.00    Types: Cigarettes   Smokeless tobacco: Never Used  Substance and Sexual Activity   Alcohol use: Not Currently   Drug use: No   Sexual activity: Yes  Lifestyle   Physical activity    Days per week: Not on file    Minutes per session: Not on file  Stress: Not on file  Relationships   Social connections    Talks on phone: Not on file    Gets together: Not on file    Attends religious service: Not on file    Active member of club or organization: Not on file    Attends meetings of clubs or organizations: Not on file    Relationship status: Not on file   Intimate partner violence    Fear of current or ex partner: Not on file    Emotionally abused: Not on file    Physically abused: Not on file    Forced sexual activity: Not on file  Other Topics Concern   Not on file  Social History Narrative   She operates an entertainment business    Medications:   Current Outpatient Medications on File Prior to Visit  Medication Sig Dispense Refill   albuterol (PROVENTIL HFA;VENTOLIN HFA) 108 (90 Base) MCG/ACT inhaler Inhale 2 puffs into the lungs every 6 (six) hours as needed for wheezing or shortness of breath.      aspirin EC 81 MG tablet Take 1 tablet (81 mg total) by mouth daily. 30 tablet 1   dofetilide (TIKOSYN) 500 MCG capsule TAKE 1 CAPSULE (500 MCG TOTAL) 2 (TWO) TIMES DAILY BY MOUTH. 180 capsule 1   DULOXETINE HCL PO Take 40 mg by mouth daily.      ELIQUIS 5 MG TABS tablet TAKE 1 TABLET BY MOUTH TWICE A DAY 180 tablet 1   Empagliflozin-metFORMIN HCl (SYNJARDY)  12.06-998 MG TABS Take 1 tablet by mouth 2 (two) times daily.     furosemide (LASIX) 20 MG tablet Take 20 mg by mouth as needed for edema.      ibuprofen (ADVIL,MOTRIN) 800 MG tablet Take 800 mg by mouth 3 (three) times daily as needed for mild pain.      losartan (COZAAR) 25 MG tablet Take 1 tablet (25 mg total) by mouth daily. 90 tablet 3   Multiple Vitamin (MULTIVITAMIN) tablet Take 1 tablet by mouth at bedtime.      polyethylene glycol (MIRALAX / GLYCOLAX) 17 g packet Take 17 g by mouth daily as needed.     repaglinide (PRANDIN) 1 MG tablet Take 1 mg by mouth 3 (three) times daily before meals.     rosuvastatin (CRESTOR) 5 MG tablet Take 5 mg by mouth at bedtime.      No current facility-administered medications on file prior to visit.     Allergies:   Allergies  Allergen Reactions   Avelox [Moxifloxacin]     Unknown reaction    Chantix [Varenicline]     nightmares   Fosamax [Alendronate]     Pain all over   Pradaxa [Dabigatran Etexilate Mesylate]     Extreme bleeding   Statins Nausea And Vomiting and Other (See Comments)    Muscle pain, Dizziness (intolerance)   Sulfamethoxazole Nausea And Vomiting    Dizziness (intolerance)   Sulfonamide Derivatives Hives    Nausea vertigo   Ciprofloxacin Rash   Gabapentin Rash    burning   Tape Itching and Rash    Please use "paper" tape only    Physical Exam General: well developed, well nourished middle-aged Caucasian lady, seated, in no evident distress Head: head normocephalic and atraumatic.  Neck: supple with no carotid or supraclavicular bruits Cardiovascular: regular rate and rhythm, no murmurs; +2 pitting edema bilateral lower extremities Musculoskeletal: no deformity Skin:  no rash/petichiae  Vascular:  Normal pulses all extremities Vitals:  09/30/18 1549  BP: (!) 141/75  Pulse: (!) 45  Temp: 97.7 F (36.5 C)   Neurologic Exam Mental Status: Awake and fully alert. Oriented to place and time.  Recent and remote memory intact. Attention span, concentration and fund of knowledge appropriate. Mood and affect appropriate.  Cranial Nerves: Pupils equal, briskly reactive to light. Extraocular movements full without nystagmus. Visual fields full to confrontation. Hearing intact. Facial sensation intact. Face, tongue, palate moves normally and symmetrically.  Motor: Normal bulk and tone. Normal strength in all tested extremity muscles.  Mildly diminished fine finger movements on the left.  No resting or action tremor noted. Sensory.: intact to touch ,pinprick .position sense but diminished vibratory sensation from ankle down bilaterally. Coordination: Rapid alternating movements normal in all extremities. Finger-to-nose and heel-to-shin performed accurately bilaterally. Gait and Station: Arises from chair without difficulty. Stance is normal. Gait is slightly broad-based but demonstrates normal stride length and balance .  Reflexes: 1+ and symmetric except both ankle jerks are depressed. Toes downgoing.       ASSESSMENT: Melanie Lucas is a 68 year old female with right periventricular white matter infarct in December 2019, right corona radiata infarct in November 2019 and left brain TIA in May 2019 and right thalamic infarct in 2011 due to small vessel disease but longstanding history of chronic atrial fibrillation on anticoagulation with Eliquis.  Also mild underlying chronic peripheral sensory neuropathy.  Vascular risk factors of diabetes, hypertension, hyperlipidemia and atrial fibrillation.  Residual deficits of mild left upper extremity decreased dexterity but overall stable from neurological standpoint.  She has multiple other complaints at today's visit which was not felt to be neurologically related    PLAN: Continue Eliquis (apixaban) daily and Crestor 5 mg daily for secondary stroke prevention Advised to continue to follow with urology as recommended at hospital discharge Advised to  follow-up with PCP for further evaluation of upper abdominal pain/rib pain with possible need of GI referral for further evaluation She has had bilateral finger numbness prior to undergoing cervical procedure which has been unchanged Discussion regarding undergoing sleep evaluation but patient will call office if interested in the future as she is currently declining evaluation Discussion regarding multiple complaints may have association with increased stress with anxiety/depression.  Discussion regarding stress relaxation techniques and ongoing follow-up with PCP for depression/anxiety management maintain strict control of hypertension with blood pressure goal below 130/90, diabetes with hemoglobin A1c goal below 6.5% and lipids with LDL cholesterol goal below 70 mg/dL. I also advised the patient to eat a healthy diet with plenty of whole grains, cereals, fruits and vegetables, exercise regularly and maintain ideal body weight .  Overall stable from a neurological standpoint recommend follow-up as needed  Greater than 50% of time during this 25 minute visit was spent on counseling,explanation of diagnosis, planning of further management, discussion with patient and family and coordination of care  Frann Rider [facility]), AGNP-BC  [facility] [address] [place], [place] [address]  Phone [phone] Fax [phone] Note: This document was prepared with digital dictation and possible smart phrase technology. Any transcriptional errors that result from this process are unintentional.

## 2018-10-01 ENCOUNTER — Encounter: Payer: Self-pay | Admitting: Adult Health

## 2018-10-04 NOTE — Progress Notes (Signed)
I agree with the above plan 

## 2018-10-09 ENCOUNTER — Other Ambulatory Visit: Payer: Self-pay | Admitting: Family Medicine

## 2018-10-09 DIAGNOSIS — Z1231 Encounter for screening mammogram for malignant neoplasm of breast: Secondary | ICD-10-CM

## 2018-10-13 ENCOUNTER — Ambulatory Visit
Admission: RE | Admit: 2018-10-13 | Discharge: 2018-10-13 | Disposition: A | Discharge status: Home / Self Care | Payer: Medicare Other | Source: Ambulatory Visit | Attending: Family Medicine | Admitting: Family Medicine

## 2018-10-13 ENCOUNTER — Other Ambulatory Visit: Payer: Self-pay

## 2018-10-13 DIAGNOSIS — Z1231 Encounter for screening mammogram for malignant neoplasm of breast: Secondary | ICD-10-CM

## 2018-10-29 ENCOUNTER — Telehealth: Payer: Self-pay | Admitting: *Deleted

## 2018-10-29 NOTE — Telephone Encounter (Signed)
   [facility] Pre-operative Risk Assessment    Request for surgical clearance:  1. What type of surgery is being performed? COLONOSCOPY   2. When is this surgery scheduled? TBD   3. What type of clearance is required (medical clearance vs. Pharmacy clearance to hold med vs. Both)? BOTH  4. Are there any medications that need to be held prior to surgery and how long? ELIQUIS   5. Practice name and name of physician performing surgery? [facility]; DR. Oletta Lamas   6. What is your office phone number [phone]    7.   What is your office fax number [phone]  8.   Anesthesia type (None, local, MAC, general) ? PROPOFOL   Melanie Lucas 10/29/2018, 10:57 AM  _________________________________________________________________   (provider comments below)

## 2018-10-29 NOTE — Telephone Encounter (Signed)
Pt takes Eliquis for afib with CHADS2VASc score of 7 (age, sex, HTN, DM, CAD, recurrent strokes - TIA in May 2019, infarct in Nov 2019 and Dec 2019). Renal function is normal. Recommend only holding Eliquis for 1 day prior to colonoscopy and resume as soon as safely possible after given high CV risk.

## 2018-10-30 NOTE — Telephone Encounter (Signed)
   Primary Cardiologist: Ena Dawley, MD  Chart reviewed as part of pre-operative protocol coverage. Patient was contacted 10/30/2018 in reference to pre-operative risk assessment for pending surgery as outlined below.  Melanie Lucas was last seen on 05/28/2018 by Lyda Jester, PA-C.  Since that day, GLENETTE HOSAKA has done fine from a cardiac standpoint. She has chronic SOB/DOE 2/2 COPD but no significant change in recent months. She is able to walk on level ground, go up a flight of stairs, do yard work, and ADLs/household chores without anginal complaints, easily >4 METs.   Therefore, based on ACC/AHA guidelines, the patient would be at acceptable risk for the planned procedure without further cardiovascular testing.   Per pharmacy recommendations, patient can hold eliquis 1 day prior to her upcoming colonoscopy and should resume eliquis as soon as she is cleared to do so by her gastroenterologist.  I will route this recommendation to the requesting party via [facility] fax function and remove from pre-op pool.  Please call with questions.  Abigail Butts, PA-C 10/30/2018, 3:57 PM

## 2018-11-03 ENCOUNTER — Encounter

## 2018-11-03 ENCOUNTER — Telehealth (INDEPENDENT_AMBULATORY_CARE_PROVIDER_SITE_OTHER): Payer: Medicare Other | Admitting: Internal Medicine

## 2018-11-03 VITALS — Ht 67.0 in | Wt 166.0 lb

## 2018-11-03 DIAGNOSIS — F17200 Nicotine dependence, unspecified, uncomplicated: Secondary | ICD-10-CM | POA: Diagnosis not present

## 2018-11-03 DIAGNOSIS — I10 Essential (primary) hypertension: Secondary | ICD-10-CM

## 2018-11-03 DIAGNOSIS — I480 Paroxysmal atrial fibrillation: Secondary | ICD-10-CM | POA: Diagnosis not present

## 2018-11-03 DIAGNOSIS — I2510 Atherosclerotic heart disease of native coronary artery without angina pectoris: Secondary | ICD-10-CM | POA: Diagnosis not present

## 2018-11-03 NOTE — Progress Notes (Signed)
Electrophysiology TeleHealth Note  Due to national recommendations of social distancing due to [place] 19, an audio telehealth visit is felt to be most appropriate for this patient at this time.  Verbal consent was obtained by me for the telehealth visit today.  The patient does not have capability for a virtual visit.  A phone visit is therefore required today.   Date:  11/03/2018   ID:  Melanie Lucas, DOB [birth year], MRN [id number]  Location: patient's home  Provider location:  [place]   Evaluation Performed: Follow-up visit  PCP:  Kristen Loader, FNP   Electrophysiologist:  Dr Rayann Heman  Chief Complaint:  palpitations  History of Present Illness:    Melanie Lucas is a 68 y.o. female who presents via telehealth conferencing today.  Since last being seen in our clinic, the patient reports doing very well.  Today, she denies symptoms of palpitations, chest pain,  dizziness, presyncope, or syncope.  Her SOB and edema are stable.  She continues to smoke.  She has pain in her stomach with workup ongoing.  The patient is otherwise without complaint today.  The patient denies symptoms of fevers, chills, cough, or new SOB worrisome for COVID 19.  Past Medical History:  Diagnosis Date  . Anxiety   . Cancer (HCC)    Skin- leg   . Chronic acquired lymphedema    a. R>L;  b. 03/2012 Neg LE U/S for DVT. Legs since age 62  . Complication of anesthesia    woke up during colonoscopy, Lithrostrixpy, Biospy  . COPD (chronic obstructive pulmonary disease) ([place])   . Coronary artery disease   . CVD (cardiovascular disease)   . Deaf, right   . Diabetes mellitus    Type II  . Diabetic neuropathy ([place])   . Dysrhythmia    afib  . Elevated TSH    a. 10/2012 - inst to f/u PCP.  . Full dentures   . Hematuria    a. while on pradaxa,  she reports that she has seen Dr Margie Ege and had low risk cystoscopy  . History of kidney stones   . Hyperlipidemia   . Hypertension    patient denies  .  Incontinence    prior to urinating  . Lacunar infarction ([place])    a. 02/2009 non-acute Lacunar infarct of the right thalamus noted on MRI of brain.  . Left carotid stenosis   . Lymphedema of extremity    had this problem, since she was a teenager. especially seen in R LE  . Meniere disease   . Myocardial infarction ([place])   . Obesity   . Osteoarthritis    cervical & lumbar region, knees, hands cramp also   . PAF with post-termination pauses    a. on dronedarone;  b. CHA2DS2VASc = 5 (HTN, DM, h/o lacunar infarct on MRI, Female) ->refused oral anticoagulation after h/o hematuria on pradaxa;  c. 02/2012 Echo: EF 55-60%, mildly dil LA. D. Recurrent PAF 10/2012 after only taking Multaq 1x/day - spont conv to NSR, placed back on BID Multaq/eliquis;  e. 10/2014 Multaq d/c'd->tikosyn initiated.  . Shortness of breath dyspnea    with exertion  . Stroke [facility])    2 mini strokes  . Tobacco abuse   . Vision abnormalities     Past Surgical History:  Procedure Laterality Date  . ANTERIOR CERVICAL CORPECTOMY N/A 02/21/2016   Procedure: ANTERIOR CERVICAL CORPECTOMY ANS FUSION CERVICAL SIX , ANTERIOR PLATING CERVICAL FIVE-SEVEN;  Surgeon: Consuella Lose,  MD;  Location: [place];  Service: Neurosurgery;  Laterality: N/A;  . ANTERIOR FUSION CERVICAL SPINE  02/21/2016  . Biopsy of adrenal glands    . BREAST BIOPSY Bilateral    benign results, both breasts   . CARDIAC CATHETERIZATION  2014   x1 stent placed, done in [place]   . CARDIOVERSION N/A 11/11/2014   Procedure: CARDIOVERSION;  Surgeon: Larey Dresser, MD;  Location: [place];  Service: Cardiovascular;  Laterality: N/A;  . CAROTID ENDARTERECTOMY Left 07/29/2017  . COLONOSCOPY W/ POLYPECTOMY    . CORONARY STENT PLACEMENT    . ENDARTERECTOMY Left 07/29/2017   Procedure: ENDARTERECTOMY CAROTID LEFT;  Surgeon: Rosetta Posner, MD;  Location: [place];  Service: Vascular;  Laterality: Left;  . PATCH ANGIOPLASTY Left 07/29/2017   Procedure: PATCH  ANGIOPLASTY USING HEMASHIELD GOLD VASCULAR PATCH;  Surgeon: Rosetta Posner, MD;  Location: [place];  Service: Vascular;  Laterality: Left;  . TEE WITHOUT CARDIOVERSION N/A 11/11/2014   Procedure: TRANSESOPHAGEAL ECHOCARDIOGRAM (TEE);  Surgeon: Larey Dresser, MD;  Location: [facility] ENDOSCOPY;  Service: Cardiovascular;  Laterality: N/A;    Current Outpatient Medications  Medication Sig Dispense Refill  . albuterol (PROVENTIL HFA;VENTOLIN HFA) 108 (90 Base) MCG/ACT inhaler Inhale 2 puffs into the lungs every 6 (six) hours as needed for wheezing or shortness of breath.     [name] aspirin EC 81 MG tablet Take 1 tablet (81 mg total) by mouth daily. 30 tablet 1  . dofetilide (TIKOSYN) 500 MCG capsule TAKE 1 CAPSULE (500 MCG TOTAL) 2 (TWO) TIMES DAILY BY MOUTH. 180 capsule 1  . DULOXETINE HCL PO Take 40 mg by mouth daily.     [name] ELIQUIS 5 MG TABS tablet TAKE 1 TABLET BY MOUTH TWICE A DAY 180 tablet 1  . Empagliflozin-metFORMIN HCl (SYNJARDY) 12.06-998 MG TABS Take 1 tablet by mouth 2 (two) times daily.    . furosemide (LASIX) 20 MG tablet Take 20 mg by mouth as needed for edema.     [name] ibuprofen (ADVIL,MOTRIN) 800 MG tablet Take 800 mg by mouth 3 (three) times daily as needed for mild pain.     . Multiple Vitamin (MULTIVITAMIN) tablet Take 1 tablet by mouth at bedtime.     . polyethylene glycol (MIRALAX / GLYCOLAX) 17 g packet Take 17 g by mouth daily as needed.    . repaglinide (PRANDIN) 1 MG tablet Take 1 mg by mouth 3 (three) times daily before meals.    . rosuvastatin (CRESTOR) 5 MG tablet Take 5 mg by mouth at bedtime.     [name] losartan (COZAAR) 25 MG tablet Take 1 tablet (25 mg total) by mouth daily. 90 tablet 3   No current facility-administered medications for this visit.     Allergies:   Avelox [moxifloxacin], Chantix [varenicline], Fosamax [alendronate], Pradaxa [dabigatran etexilate mesylate], Statins, Sulfamethoxazole, Sulfonamide derivatives, Ciprofloxacin, Gabapentin, and Tape   Social History:  The  patient  reports that she has been smoking cigarettes. She has a 55.00 pack-year smoking history. She has never used smokeless tobacco. She reports previous alcohol use. She reports that she does not use drugs.   Family History:  The patient's family history includes Bladder Cancer in her mother; Breast cancer in her maternal aunt, maternal aunt, and paternal aunt; Coronary artery disease in her father; Heart attack in her father; Leukemia in her brother.   ROS:  Please see the history of present illness.   All other systems are personally reviewed and negative.  Exam:    Vital Signs:  Ht 5\' 7"  (1.702 m)   Wt 166 lb (75.3 kg)   BMI 26.00 kg/m   Well sounding, alert and conversant   Labs/Other Tests and Data Reviewed:    Recent Labs: 01/21/2018: ALT 17 01/22/2018: Magnesium 2.1 08/27/2018: BUN 15; Creatinine, Ser 0.69; Hemoglobin 15.0; Platelets 181; Potassium 3.6; Sodium 137   Wt Readings from Last 3 Encounters:  11/03/18 166 lb (75.3 kg)  09/30/18 167 lb (75.8 kg)  08/14/18 163 lb (73.9 kg)     ASSESSMENT & PLAN:    1.  Paroxysmal atrial fibrillation Doing well with Melanie Lucas Qt is reviewed by EKG in June Follow-up in AF clinic in 2 months On eliquis for chads2vasc score of 5 We have discussed ILR in the past.  She may be more willing to consider this after her GI workup for abdomina pain and adrenal masses is completed.  2. CAD No ischemic symptoms  3. Tobacco Still smoking Cessation advised  4. HTN Stable No change required today  5. Overweight She is working on this diligently   Follow-up:  AF clinic in 2 months to follow-up on tikosyn   Patient Risk:  after full review of this patients clinical status, I feel that they are at moderate risk at this time.  Today, I have spent 15 minutes with the patient with telehealth technology discussing arrhythmia management .    Army Fossa, MD  11/03/2018 4:03 PM     [place] [address] [phone] (office) [phone] (fax)

## 2018-11-10 ENCOUNTER — Other Ambulatory Visit (HOSPITAL_COMMUNITY): Payer: Self-pay | Admitting: Cardiology

## 2018-11-10 NOTE — Telephone Encounter (Signed)
38f 75.8kg Scr 0.69 08/27/18 Lovw/allred 11/03/18

## 2018-12-01 ENCOUNTER — Ambulatory Visit: Payer: Medicare Other | Admitting: Internal Medicine

## 2018-12-24 ENCOUNTER — Other Ambulatory Visit: Payer: Self-pay | Admitting: Internal Medicine

## 2019-01-05 ENCOUNTER — Other Ambulatory Visit: Payer: Self-pay

## 2019-01-05 ENCOUNTER — Ambulatory Visit (HOSPITAL_COMMUNITY)
Admission: RE | Admit: 2019-01-05 | Discharge: 2019-01-05 | Disposition: A | Discharge status: Home / Self Care | Payer: Medicare Other | Source: Ambulatory Visit | Attending: Nurse Practitioner | Admitting: Nurse Practitioner

## 2019-01-05 ENCOUNTER — Encounter (HOSPITAL_COMMUNITY): Payer: Self-pay | Admitting: Nurse Practitioner

## 2019-01-05 VITALS — BP 156/74 | HR 55 | Ht 67.0 in | Wt 170.0 lb

## 2019-01-05 DIAGNOSIS — Z79899 Other long term (current) drug therapy: Secondary | ICD-10-CM | POA: Insufficient documentation

## 2019-01-05 DIAGNOSIS — I4891 Unspecified atrial fibrillation: Secondary | ICD-10-CM | POA: Insufficient documentation

## 2019-01-05 DIAGNOSIS — E1140 Type 2 diabetes mellitus with diabetic neuropathy, unspecified: Secondary | ICD-10-CM | POA: Diagnosis not present

## 2019-01-05 DIAGNOSIS — J449 Chronic obstructive pulmonary disease, unspecified: Secondary | ICD-10-CM | POA: Diagnosis not present

## 2019-01-05 DIAGNOSIS — Z7982 Long term (current) use of aspirin: Secondary | ICD-10-CM | POA: Insufficient documentation

## 2019-01-05 DIAGNOSIS — I2510 Atherosclerotic heart disease of native coronary artery without angina pectoris: Secondary | ICD-10-CM | POA: Insufficient documentation

## 2019-01-05 DIAGNOSIS — D6869 Other thrombophilia: Secondary | ICD-10-CM | POA: Diagnosis not present

## 2019-01-05 DIAGNOSIS — I252 Old myocardial infarction: Secondary | ICD-10-CM | POA: Insufficient documentation

## 2019-01-05 DIAGNOSIS — F17210 Nicotine dependence, cigarettes, uncomplicated: Secondary | ICD-10-CM | POA: Diagnosis not present

## 2019-01-05 DIAGNOSIS — M1990 Unspecified osteoarthritis, unspecified site: Secondary | ICD-10-CM | POA: Insufficient documentation

## 2019-01-05 DIAGNOSIS — E785 Hyperlipidemia, unspecified: Secondary | ICD-10-CM | POA: Insufficient documentation

## 2019-01-05 DIAGNOSIS — Z7901 Long term (current) use of anticoagulants: Secondary | ICD-10-CM | POA: Diagnosis not present

## 2019-01-05 DIAGNOSIS — I480 Paroxysmal atrial fibrillation: Secondary | ICD-10-CM

## 2019-01-05 DIAGNOSIS — I10 Essential (primary) hypertension: Secondary | ICD-10-CM | POA: Insufficient documentation

## 2019-01-05 DIAGNOSIS — Z7984 Long term (current) use of oral hypoglycemic drugs: Secondary | ICD-10-CM | POA: Diagnosis not present

## 2019-01-05 DIAGNOSIS — Z8673 Personal history of transient ischemic attack (TIA), and cerebral infarction without residual deficits: Secondary | ICD-10-CM | POA: Insufficient documentation

## 2019-01-05 LAB — MAGNESIUM: Magnesium: 2.1 mg/dL (ref 1.7–2.4)

## 2019-01-05 LAB — BASIC METABOLIC PANEL
Anion gap: 13 (ref 5–15)
BUN: 16 mg/dL (ref 8–23)
CO2: 27 mmol/L (ref 22–32)
Calcium: 9 mg/dL (ref 8.9–10.3)
Chloride: 101 mmol/L (ref 98–111)
Creatinine, Ser: 0.7 mg/dL (ref 0.44–1.00)
GFR calc Af Amer: >60 mL/min (ref >60)
GFR calc non Af Amer: >60 mL/min (ref >60)
Glucose, Bld: 171 mg/dL — ABNORMAL HIGH (ref 70–99)
Potassium: 4.1 mmol/L (ref 3.5–5.1)
Sodium: 141 mmol/L (ref 135–145)

## 2019-01-05 NOTE — Progress Notes (Addendum)
Patient ID: Melanie Lucas, female   DOB: [birth year], 68 y.o.   MRN: [id number]     Primary Care Physician: Kristen Loader, FNP Referring Physician: [facility] f/u   Melanie Lucas is a 68 y.o. female with a h/o PAF previously managed with dronedarone, lacunar infarct, HTN, DM, CAD, and tobacco abuse. She was  Seen 11/09/15 and was noted to be back in atrial fibrillation with RVR. Decision was made to admit her for further evaluation and eliquis initiation followed by tikosyn loading.  Following admission, pt was placed on IV diltitazem and dronedarone was discontinued. Eliquis was started with a plan to perfom TEE and cardioversion after she had received 5 doses of eliquis. Rates were relatively stable and troponins were normal. She underwent TEE on 9/23 revealing no evidence of LAA thrombus. This was followed by DCCV using one synchronized shock at 200 joules with successful conversion to sinus bradycardia with intermittent junctional rhythm. Following cardioversion, she was loaded with Tikosyn 500 mcg q12 hrs. She  tolerated tikosyn loading well and has not had any further atrial fibrillation. Her QTc  remained relatively stable.  F/u in afib clinic, 01/05/19, after seeing Dr. Rayann Heman virtually  in September.  I have not seen pt since 2017. She states that she has not noted any recent afib. Remains on dofetilide 500 mcg bid and eliquis 5 mg bid for chadsvasc score of at least  7.   Today, she denies symptoms of palpitations, chest pain, shortness of breath, orthopnea, PND, lower extremity edema, dizziness, presyncope, syncope, or neurologic sequela. The patient is tolerating medications without difficulties and is otherwise without complaint today.   Past Medical History:  Diagnosis Date  . Anxiety   . Cancer (HCC)    Skin- leg   . Chronic acquired lymphedema    a. R>L;  b. 03/2012 Neg LE U/S for DVT. Legs since age 57  . Complication of anesthesia    woke up during colonoscopy, Lithrostrixpy,  Biospy  . COPD (chronic obstructive pulmonary disease) ([place])   . Coronary artery disease   . CVD (cardiovascular disease)   . Deaf, right   . Diabetes mellitus    Type II  . Diabetic neuropathy ([place])   . Dysrhythmia    afib  . Elevated TSH    a. 10/2012 - inst to f/u PCP.  . Full dentures   . Hematuria    a. while on pradaxa,  she reports that she has seen Dr Margie Ege and had low risk cystoscopy  . History of kidney stones   . Hyperlipidemia   . Hypertension    patient denies  . Incontinence    prior to urinating  . Lacunar infarction ([place])    a. 02/2009 non-acute Lacunar infarct of the right thalamus noted on MRI of brain.  . Left carotid stenosis   . Lymphedema of extremity    had this problem, since she was a teenager. especially seen in R LE  . Meniere disease   . Myocardial infarction ([place])   . Obesity   . Osteoarthritis    cervical & lumbar region, knees, hands cramp also   . PAF with post-termination pauses    a. on dronedarone;  b. CHA2DS2VASc = 5 (HTN, DM, h/o lacunar infarct on MRI, Female) ->refused oral anticoagulation after h/o hematuria on pradaxa;  c. 02/2012 Echo: EF 55-60%, mildly dil LA. D. Recurrent PAF 10/2012 after only taking Multaq 1x/day - spont conv to NSR, placed back on BID Multaq/eliquis;  e. 10/2014 Multaq d/c'd->tikosyn initiated.  . Shortness of breath dyspnea    with exertion  . Stroke [facility])    2 mini strokes  . Tobacco abuse   . Vision abnormalities    Past Surgical History:  Procedure Laterality Date  . ANTERIOR CERVICAL CORPECTOMY N/A 02/21/2016   Procedure: ANTERIOR CERVICAL CORPECTOMY ANS FUSION CERVICAL SIX , ANTERIOR PLATING CERVICAL FIVE-SEVEN;  Surgeon: Consuella Lose, MD;  Location: [place];  Service: Neurosurgery;  Laterality: N/A;  . ANTERIOR FUSION CERVICAL SPINE  02/21/2016  . Biopsy of adrenal glands    . BREAST BIOPSY Bilateral    benign results, both breasts   . CARDIAC CATHETERIZATION  2014   x1 stent placed, done in [place]    . CARDIOVERSION N/A 11/11/2014   Procedure: CARDIOVERSION;  Surgeon: Larey Dresser, MD;  Location: [place];  Service: Cardiovascular;  Laterality: N/A;  . CAROTID ENDARTERECTOMY Left 07/29/2017  . COLONOSCOPY W/ POLYPECTOMY    . CORONARY STENT PLACEMENT    . ENDARTERECTOMY Left 07/29/2017   Procedure: ENDARTERECTOMY CAROTID LEFT;  Surgeon: Rosetta Posner, MD;  Location: [place];  Service: Vascular;  Laterality: Left;  . PATCH ANGIOPLASTY Left 07/29/2017   Procedure: PATCH ANGIOPLASTY USING HEMASHIELD GOLD VASCULAR PATCH;  Surgeon: Rosetta Posner, MD;  Location: [place];  Service: Vascular;  Laterality: Left;  . TEE WITHOUT CARDIOVERSION N/A 11/11/2014   Procedure: TRANSESOPHAGEAL ECHOCARDIOGRAM (TEE);  Surgeon: Larey Dresser, MD;  Location: [facility] ENDOSCOPY;  Service: Cardiovascular;  Laterality: N/A;    Current Outpatient Medications  Medication Sig Dispense Refill  . albuterol (PROVENTIL HFA;VENTOLIN HFA) 108 (90 Base) MCG/ACT inhaler Inhale 2 puffs into the lungs every 6 (six) hours as needed for wheezing or shortness of breath.     [name] aspirin EC 81 MG tablet Take 1 tablet (81 mg total) by mouth daily. 30 tablet 1  . dofetilide (TIKOSYN) 500 MCG capsule TAKE 1 CAPSULE (500 MCG TOTAL) 2 (TWO) TIMES DAILY BY MOUTH. 180 capsule 0  . DULOXETINE HCL PO Take 40 mg by mouth daily.     [name] ELIQUIS 5 MG TABS tablet TAKE 1 TABLET BY MOUTH TWICE A DAY 180 tablet 1  . Empagliflozin-metFORMIN HCl (SYNJARDY) 12.06-998 MG TABS Take 1 tablet by mouth 2 (two) times daily.    . furosemide (LASIX) 20 MG tablet Take 20 mg by mouth as needed for edema.     [name] ibuprofen (ADVIL,MOTRIN) 800 MG tablet Take 800 mg by mouth 3 (three) times daily as needed for mild pain.     [name] losartan (COZAAR) 25 MG tablet Take 1 tablet (25 mg total) by mouth daily. 90 tablet 3  . Multiple Vitamin (MULTIVITAMIN) tablet Take 1 tablet by mouth at bedtime.     . polyethylene glycol (MIRALAX / GLYCOLAX) 17 g packet Take 17 g by mouth daily  as needed.    . repaglinide (PRANDIN) 2 MG tablet     . rosuvastatin (CRESTOR) 5 MG tablet Take 5 mg by mouth at bedtime.      No current facility-administered medications for this encounter.     Allergies  Allergen Reactions  . Avelox [Moxifloxacin]     Unknown reaction   . Chantix [Varenicline]     nightmares  . Fosamax [Alendronate]     Pain all over  . Pradaxa [Dabigatran Etexilate Mesylate]     Extreme bleeding  . Statins Nausea And Vomiting and Other (See Comments)    Muscle pain, Dizziness (intolerance)  .  Sulfamethoxazole Nausea And Vomiting    Dizziness (intolerance)  . Sulfonamide Derivatives Hives    Nausea vertigo  . Ciprofloxacin Rash  . Gabapentin Rash    burning  . Tape Itching and Rash    Please use "paper" tape only    Social History   Socioeconomic History  . Marital status: Married    Spouse name: Coralee Pesa  . Number of children: 2  . Years of education: 21  . Highest education level: Master's degree (e.g., MA, MS, MEng, MEd, MSW, MBA)  Occupational History  . Occupation: retired    Comment: Pharmacist, hospital - art  Social Needs  . Financial resource strain: Not on file  . Food insecurity    Worry: Not on file    Inability: Not on file  . Transportation needs    Medical: Not on file    Non-medical: Not on file  Tobacco Use  . Smoking status: Current Every Day Smoker    Packs/day: 1.00    Years: 55.00    Pack years: 55.00    Types: Cigarettes  . Smokeless tobacco: Never Used  Substance and Sexual Activity  . Alcohol use: Yes    Alcohol/week: 1.0 standard drinks    Types: 1 Glasses of wine per week  . Drug use: No  . Sexual activity: Yes  Lifestyle  . Physical activity    Days per week: Not on file    Minutes per session: Not on file  . Stress: Not on file  Relationships  . Social Herbalist on phone: Not on file    Gets together: Not on file    Attends religious service: Not on file    Active member of club or organization: Not on  file    Attends meetings of clubs or organizations: Not on file    Relationship status: Not on file  . Intimate partner violence    Fear of current or ex partner: Not on file    Emotionally abused: Not on file    Physically abused: Not on file    Forced sexual activity: Not on file  Other Topics Concern  . Not on file  Social History Narrative   She operates an entertainment business    Family History  Problem Relation Age of Onset  . Heart attack Father   . Coronary artery disease Father        strong family hx  . Bladder Cancer Mother        bladder  . Leukemia Brother   . Breast cancer Maternal Aunt   . Breast cancer Paternal Aunt   . Breast cancer Maternal Aunt     ROS- All systems are reviewed and negative except as per the HPI above  Physical Exam: Vitals:   01/05/19 1335  BP: (!) 156/74  Pulse: (!) 55  Weight: 77.1 kg  Height: 5\' 7"  (1.702 m)    GEN- The patient is well appearing, alert and oriented x 3 today.   Head- normocephalic, atraumatic Eyes-  Sclera clear, conjunctiva pink Ears- hearing intact Oropharynx- clear Neck- supple, no JVP Lymph- no cervical lymphadenopathy Lungs- Clear to ausculation bilaterally, normal work of breathing Heart- Regular rate and rhythm, no murmurs, rubs or gallops, PMI not laterally displaced GI- soft, NT, ND, + BS Extremities- no clubbing, cyanosis, or edema MS- no significant deformity or atrophy Skin- no rash or lesion Psych- euthymic mood, full affect Neuro- strength and sensation are intact  EKG- sinus brady at 56  bpm, pr int 120 ms, qrs 100 ms, qtc 441 ms Labs checked 10/3, K+ 4.0 and mag 2.0 Epic records reviewed Echo-- Left ventricle: The cavity size was normal. Systolic function was normal. The estimated ejection fraction was in the range of 50% to 55%. Wall motion was normal; there were no regional wall motion abnormalities. The study was not technically sufficient to allow evaluation of LV  diastolic dysfunction due to atrial fibrillation. - Aortic valve: Trileaflet; normal thickness leaflets. There was no regurgitation. - Aortic root: The aortic root was normal in size. - Ascending aorta: The ascending aorta was normal in size. - Mitral valve: Structurally normal valve. There was mild regurgitation. - Left atrium: The atrium was normal in size. - Right ventricle: The cavity size was normal. Wall thickness was normal. Systolic function was normal. - Tricuspid valve: There was mild-moderate regurgitation. - Pulmonary arteries: Systolic pressure was mildly increased. PA peak pressure: 37 mm Hg (S). - Inferior vena cava: The vessel was normal in size. - Pericardium, extracardiac: There was no pericardial effusion.   Assessment and Plan: 1. Afib In SR on tikosyn 500 mcg bid qtc is stable  Continue eliquis/ASA chadsvasc score at least 7 bmet/mag  2. Tobacco abuse Encouraged cessation  3. HTN  Not optimal today Avoid salt    4. CAD  No  anginal symptoms   F/u Dr. Rayann Heman 03/2018  Melanie Lucas, [facility] [address] [place], [place] [address] [phone]

## 2019-02-20 ENCOUNTER — Other Ambulatory Visit: Payer: Self-pay | Admitting: Internal Medicine

## 2019-03-18 ENCOUNTER — Telehealth: Payer: Self-pay | Admitting: Internal Medicine

## 2019-03-18 NOTE — Telephone Encounter (Signed)
Called to discuss with patient about Covid symptoms and the use of bamlanivimab, a monoclonal antibody infusion for those with mild to moderate Covid symptoms and at high risk of hospitalization.   Pt appears to qualify for this infusion at [facility] infusion center due to co-morbid conditions and/or member of at risk group. Pt is 69yo with multiple comorbities including htn, DM2, CVD and COPD. I have confirmed covid + results with MD on call for [place].   Unable to reach pt. I will attempt to reach her first thing in am 1/28.    Alan Ripper, NP-C [place]

## 2019-03-19 ENCOUNTER — Other Ambulatory Visit: Payer: Self-pay | Admitting: Internal Medicine

## 2019-03-19 DIAGNOSIS — J449 Chronic obstructive pulmonary disease, unspecified: Secondary | ICD-10-CM

## 2019-03-19 DIAGNOSIS — I2510 Atherosclerotic heart disease of native coronary artery without angina pectoris: Secondary | ICD-10-CM

## 2019-03-19 DIAGNOSIS — I10 Essential (primary) hypertension: Secondary | ICD-10-CM

## 2019-03-19 DIAGNOSIS — U071 COVID-19: Secondary | ICD-10-CM

## 2019-03-19 NOTE — Progress Notes (Unsigned)
  I connected by phone with Melanie Lucas on 03/19/2019 at 8:38 AM to discuss the potential use of an new treatment for mild to moderate COVID-19 viral infection in non-hospitalized patients.  This patient is a 69 y.o. female that meets the FDA criteria for Emergency Use Authorization of bamlanivimab or casirivimab\imdevimab.  Has a (+) direct SARS-CoV-2 viral test result  Has mild or moderate COVID-19   Is ? 71 years of age and weighs ? 40 kg  Is NOT hospitalized due to COVID-19  Is NOT requiring oxygen therapy or requiring an increase in baseline oxygen flow rate due to COVID-19  Is within 10 days of symptom onset  Has at least one of the high risk factor(s) for progression to severe COVID-19 and/or hospitalization as defined in EUA.  Specific high risk criteria : >/= 71 yo also with DM2, htn, CAD, COPD   I have spoken and communicated the following to the patient or parent/caregiver:  1. FDA has authorized the emergency use of bamlanivimab and casirivimab\imdevimab for the treatment of mild to moderate COVID-19 in adults and pediatric patients with positive results of direct SARS-CoV-2 viral testing who are 63 years of age and older weighing at least 40 kg, and who are at high risk for progressing to severe COVID-19 and/or hospitalization.  2. The significant known and potential risks and benefits of bamlanivimab and casirivimab\imdevimab, and the extent to which such potential risks and benefits are unknown.  3. Information on available alternative treatments and the risks and benefits of those alternatives, including clinical trials.  4. Patients treated with bamlanivimab and casirivimab\imdevimab should continue to self-isolate and use infection control measures (e.g., wear mask, isolate, social distance, avoid sharing personal items, clean and disinfect "high touch" surfaces, and frequent handwashing) according to CDC guidelines.   5. The patient or parent/caregiver has the option  to accept or refuse bamlanivimab or casirivimab\imdevimab .  After reviewing this information with the patient, The patient agreed to proceed with receiving the bamlanimivab infusion and will be provided a copy of the Fact sheet prior to receiving the infusion.Alan Ripper, NP-C [facility] [place]  pgr [phone]

## 2019-03-20 ENCOUNTER — Ambulatory Visit (HOSPITAL_COMMUNITY)
Admission: RE | Admit: 2019-03-20 | Discharge: 2019-03-20 | Disposition: A | Discharge status: Home / Self Care | Payer: Medicare Other | Source: Ambulatory Visit | Attending: Pulmonary Disease | Admitting: Pulmonary Disease

## 2019-03-20 ENCOUNTER — Encounter (HOSPITAL_COMMUNITY): Payer: Self-pay

## 2019-03-20 DIAGNOSIS — I10 Essential (primary) hypertension: Secondary | ICD-10-CM | POA: Diagnosis present

## 2019-03-20 DIAGNOSIS — U071 COVID-19: Secondary | ICD-10-CM | POA: Diagnosis present

## 2019-03-20 DIAGNOSIS — Z23 Encounter for immunization: Secondary | ICD-10-CM | POA: Insufficient documentation

## 2019-03-20 DIAGNOSIS — J449 Chronic obstructive pulmonary disease, unspecified: Secondary | ICD-10-CM | POA: Diagnosis present

## 2019-03-20 DIAGNOSIS — I2510 Atherosclerotic heart disease of native coronary artery without angina pectoris: Secondary | ICD-10-CM

## 2019-03-20 MED ORDER — DIPHENHYDRAMINE HCL 50 MG/ML IJ SOLN: 50.0000 mg | Freq: Once | INTRAMUSCULAR | Status: DC | PRN

## 2019-03-20 MED ORDER — SODIUM CHLORIDE 0.9 % IV SOLN
700.0000 mg | Freq: Once | INTRAVENOUS | Status: AC
  Administered 2019-03-20: 17:00:00 700 mg via INTRAVENOUS
  Filled 2019-03-20: qty 20

## 2019-03-20 MED ORDER — SODIUM CHLORIDE 0.9 % IV SOLN
INTRAVENOUS | Status: DC | PRN
  Administered 2019-03-20: 250 mL via INTRAVENOUS

## 2019-03-20 MED ORDER — FAMOTIDINE IN NACL 20-0.9 MG/50ML-% IV SOLN: 20.0000 mg | Freq: Once | INTRAVENOUS | Status: DC | PRN

## 2019-03-20 MED ORDER — EPINEPHRINE 0.3 MG/0.3ML IJ SOAJ: 0.3000 mg | Freq: Once | INTRAMUSCULAR | Status: DC | PRN

## 2019-03-20 MED ORDER — ALBUTEROL SULFATE HFA 108 (90 BASE) MCG/ACT IN AERS: 2.0000 | INHALATION_SPRAY | Freq: Once | RESPIRATORY_TRACT | Status: DC | PRN

## 2019-03-20 MED ORDER — METHYLPREDNISOLONE SODIUM SUCC 125 MG IJ SOLR: 125.0000 mg | Freq: Once | INTRAMUSCULAR | Status: DC | PRN

## 2019-03-20 NOTE — Progress Notes (Signed)
  Diagnosis: COVID-19  Physician: DR. Joya Gaskins  Procedure: Covid Infusion Clinic Med: bamlanivimab infusion - Provided patient with bamlanimivab fact sheet for patients, parents and caregivers prior to infusion.  Complications: No immediate complications noted.  Discharge: Discharged home   Acquanetta Chain 03/20/2019

## 2019-03-20 NOTE — Discharge Instructions (Signed)

## 2019-04-03 ENCOUNTER — Telehealth (INDEPENDENT_AMBULATORY_CARE_PROVIDER_SITE_OTHER): Payer: Medicare PPO | Admitting: Internal Medicine

## 2019-04-03 DIAGNOSIS — I480 Paroxysmal atrial fibrillation: Secondary | ICD-10-CM

## 2019-04-03 DIAGNOSIS — I10 Essential (primary) hypertension: Secondary | ICD-10-CM | POA: Diagnosis not present

## 2019-04-03 DIAGNOSIS — I2510 Atherosclerotic heart disease of native coronary artery without angina pectoris: Secondary | ICD-10-CM | POA: Diagnosis not present

## 2019-04-03 DIAGNOSIS — Z720 Tobacco use: Secondary | ICD-10-CM | POA: Diagnosis not present

## 2019-04-03 DIAGNOSIS — E663 Overweight: Secondary | ICD-10-CM

## 2019-04-03 NOTE — Progress Notes (Signed)
Electrophysiology TeleHealth Note  Due to national recommendations of social distancing due to [place] 19, an audio telehealth visit is felt to be most appropriate for this patient at this time.  Verbal consent was obtained by me for the telehealth visit today.  The patient does not have capability for a virtual visit.  A phone visit is therefore required today.   Date:  04/03/2019   ID:  Melanie Lucas, DOB 08/11/1950, MRN [id number]  Location: patient's home  Provider location:  [place]  Evaluation Performed: Follow-up visit  PCP:  Melanie Loader, FNP   Electrophysiologist:  Dr Rayann Heman  Chief Complaint:  palpitations  History of Present Illness:    Melanie Lucas is a 69 y.o. female who presents via telehealth conferencing today.  Since last being seen in our clinic, the patient reports doing very well.  Her husband died a week ago.  She is grieving.  She recently tested positive for covid 19 but did not have any symptoms.  Today, she denies symptoms of palpitations, chest pain, shortness of breath,  lower extremity edema, dizziness, presyncope, or syncope.  The patient is otherwise without complaint today.  The patient denies symptoms of fevers, chills, cough, or new SOB worrisome for COVID 19.  Past Medical History:  Diagnosis Date  . Anxiety   . Cancer (HCC)    Skin- leg   . Chronic acquired lymphedema    a. R>L;  b. 03/2012 Neg LE U/S for DVT. Legs since age 23  . Complication of anesthesia    woke up during colonoscopy, Lithrostrixpy, Biospy  . COPD (chronic obstructive pulmonary disease) ([place])   . Coronary artery disease   . CVD (cardiovascular disease)   . Deaf, right   . Diabetes mellitus    Type II  . Diabetic neuropathy ([place])   . Dysrhythmia    afib  . Elevated TSH    a. 10/2012 - inst to f/u PCP.  . Full dentures   . Hematuria    a. while on pradaxa,  she reports that she has seen Dr Margie Ege and had low risk cystoscopy  . History of kidney stones   .  Hyperlipidemia   . Hypertension    patient denies  . Incontinence    prior to urinating  . Lacunar infarction ([place])    a. 02/2009 non-acute Lacunar infarct of the right thalamus noted on MRI of brain.  . Left carotid stenosis   . Lymphedema of extremity    had this problem, since she was a teenager. especially seen in R LE  . Meniere disease   . Myocardial infarction ([place])   . Obesity   . Osteoarthritis    cervical & lumbar region, knees, hands cramp also   . PAF with post-termination pauses    a. on dronedarone;  b. CHA2DS2VASc = 5 (HTN, DM, h/o lacunar infarct on MRI, Female) ->refused oral anticoagulation after h/o hematuria on pradaxa;  c. 02/2012 Echo: EF 55-60%, mildly dil LA. D. Recurrent PAF 10/2012 after only taking Multaq 1x/day - spont conv to NSR, placed back on BID Multaq/eliquis;  e. 10/2014 Multaq d/c'd->tikosyn initiated.  . Shortness of breath dyspnea    with exertion  . Stroke [facility])    2 mini strokes  . Tobacco abuse   . Vision abnormalities     Past Surgical History:  Procedure Laterality Date  . ANTERIOR CERVICAL CORPECTOMY N/A 02/21/2016   Procedure: ANTERIOR CERVICAL CORPECTOMY ANS FUSION CERVICAL SIX ,  ANTERIOR PLATING CERVICAL FIVE-SEVEN;  Surgeon: Melanie Lose, MD;  Location: [place];  Service: Neurosurgery;  Laterality: N/A;  . ANTERIOR FUSION CERVICAL SPINE  02/21/2016  . Biopsy of adrenal glands    . BREAST BIOPSY Bilateral    benign results, both breasts   . CARDIAC CATHETERIZATION  2014   x1 stent placed, done in [place]   . CARDIOVERSION N/A 11/11/2014   Procedure: CARDIOVERSION;  Surgeon: Melanie Dresser, MD;  Location: [place];  Service: Cardiovascular;  Laterality: N/A;  . CAROTID ENDARTERECTOMY Left 07/29/2017  . COLONOSCOPY W/ POLYPECTOMY    . CORONARY STENT PLACEMENT    . ENDARTERECTOMY Left 07/29/2017   Procedure: ENDARTERECTOMY CAROTID LEFT;  Surgeon: Melanie Posner, MD;  Location: [place];  Service: Vascular;  Laterality: Left;  .  PATCH ANGIOPLASTY Left 07/29/2017   Procedure: PATCH ANGIOPLASTY USING HEMASHIELD GOLD VASCULAR PATCH;  Surgeon: Melanie Posner, MD;  Location: [place];  Service: Vascular;  Laterality: Left;  . TEE WITHOUT CARDIOVERSION N/A 11/11/2014   Procedure: TRANSESOPHAGEAL ECHOCARDIOGRAM (TEE);  Surgeon: Melanie Dresser, MD;  Location[facility] ENDOSCOPY;  Service: Cardiovascular;  Laterality: N/A;    Current Outpatient Medications  Medication Sig Dispense Refill  . albuterol (PROVENTIL HFA;VENTOLIN HFA) 108 (90 Base) MCG/ACT inhaler Inhale 2 puffs into the lungs every 6 (six) hours as needed for wheezing or shortness of breath.     [name] aspirin EC 81 MG tablet Take 1 tablet (81 mg total) by mouth daily. 30 tablet 1  . dofetilide (TIKOSYN) 500 MCG capsule TAKE 1 CAPSULE (500 MCG TOTAL) 2 (TWO) TIMES DAILY BY MOUTH. 180 capsule 0  . DULOXETINE HCL PO Take 40 mg by mouth daily.     [name] ELIQUIS 5 MG TABS tablet TAKE 1 TABLET BY MOUTH TWICE A DAY 180 tablet 1  . Empagliflozin-metFORMIN HCl (SYNJARDY) 12.06-998 MG TABS Take 1 tablet by mouth 2 (two) times daily.    . furosemide (LASIX) 20 MG tablet Take 20 mg by mouth as needed for edema.     [name] ibuprofen (ADVIL,MOTRIN) 800 MG tablet Take 800 mg by mouth 3 (three) times daily as needed for mild pain.     [name] losartan (COZAAR) 25 MG tablet Take 1 tablet (25 mg total) by mouth daily. 90 tablet 3  . Multiple Vitamin (MULTIVITAMIN) tablet Take 1 tablet by mouth at bedtime.     . polyethylene glycol (MIRALAX / GLYCOLAX) 17 g packet Take 17 g by mouth daily as needed.    . repaglinide (PRANDIN) 2 MG tablet     . rosuvastatin (CRESTOR) 5 MG tablet Take 5 mg by mouth at bedtime.      No current facility-administered medications for this visit.    Allergies:   Avelox [moxifloxacin], Chantix [varenicline], Fosamax [alendronate], Pradaxa [dabigatran etexilate mesylate], Statins, Sulfamethoxazole, Sulfonamide derivatives, Ciprofloxacin, Gabapentin, and Tape   Social History:  The  patient  reports that she has been smoking cigarettes. She has a 55.00 pack-year smoking history. She has never used smokeless tobacco. She reports current alcohol use of about 1.0 standard drinks of alcohol per week. She reports that she does not use drugs.   Family History:  The patient's family history includes Bladder Cancer in her mother; Breast cancer in her maternal aunt, maternal aunt, and paternal aunt; Coronary artery disease in her father; Heart attack in her father; Leukemia in her brother.   ROS:  Please see the history of present illness.   All other systems are  personally reviewed and negative.    Exam:    Vital Signs:  There were no vitals taken for this visit.  Well sounding, alert and conversant   Labs/Other Tests and Data Reviewed:    Recent Labs: 08/27/2018: Hemoglobin 15.0; Platelets 181 01/05/2019: BUN 16; Creatinine, Ser 0.70; Magnesium 2.1; Potassium 4.1; Sodium 141   Wt Readings from Last 3 Encounters:  01/05/19 170 lb (77.1 kg)  11/03/18 166 lb (75.3 kg)  09/30/18 167 lb (75.8 kg)     ASSESSMENT & PLAN:    1.  Paroxysmal atrial fibrillation Doing very well on tikosyn The importance of close monitoring for toxicity related to tikosyn was discussed today.  ekg 12/2018 and labs from 01/05/2019 are reviewed today Her chads2vasc score is 5.  She is on eliquis  2. CAD No ischemic symptoms Continue current medicines  3. Tobacco She is clear that she is not interested in cessation, particularly given stress of spouse's death  4. HTN Stable No change required today  5. Overweight She has lost 10 lbs, likely due to grieving.  Follow-up: AF clinic in 3 months   Patient Risk:  after full review of this patients clinical status, I feel that they are at high risk at this time.  Today, I have spent 25 minutes with the patient with telehealth technology discussing arrhythmia management .    Army Fossa, MD  04/03/2019 4:33 PM     [place] [address] [phone] (office) [phone] (fax)

## 2019-04-11 ENCOUNTER — Other Ambulatory Visit: Payer: Self-pay | Admitting: Nurse Practitioner

## 2019-06-03 DIAGNOSIS — E6609 Other obesity due to excess calories: Secondary | ICD-10-CM | POA: Diagnosis not present

## 2019-06-03 DIAGNOSIS — T466X5D Adverse effect of antihyperlipidemic and antiarteriosclerotic drugs, subsequent encounter: Secondary | ICD-10-CM | POA: Diagnosis not present

## 2019-06-03 DIAGNOSIS — E7800 Pure hypercholesterolemia, unspecified: Secondary | ICD-10-CM | POA: Diagnosis not present

## 2019-06-03 DIAGNOSIS — Z0000 Encounter for general adult medical examination without abnormal findings: Secondary | ICD-10-CM | POA: Diagnosis not present

## 2019-06-03 DIAGNOSIS — J449 Chronic obstructive pulmonary disease, unspecified: Secondary | ICD-10-CM | POA: Diagnosis not present

## 2019-06-03 DIAGNOSIS — M810 Age-related osteoporosis without current pathological fracture: Secondary | ICD-10-CM | POA: Diagnosis not present

## 2019-06-03 DIAGNOSIS — F411 Generalized anxiety disorder: Secondary | ICD-10-CM | POA: Diagnosis not present

## 2019-06-03 DIAGNOSIS — D6869 Other thrombophilia: Secondary | ICD-10-CM | POA: Diagnosis not present

## 2019-06-03 DIAGNOSIS — E1140 Type 2 diabetes mellitus with diabetic neuropathy, unspecified: Secondary | ICD-10-CM | POA: Diagnosis not present

## 2019-06-03 DIAGNOSIS — F3341 Major depressive disorder, recurrent, in partial remission: Secondary | ICD-10-CM | POA: Diagnosis not present

## 2019-06-07 ENCOUNTER — Other Ambulatory Visit: Payer: Self-pay | Admitting: Cardiology

## 2019-07-11 ENCOUNTER — Other Ambulatory Visit (HOSPITAL_COMMUNITY): Payer: Self-pay | Admitting: Internal Medicine

## 2019-07-13 NOTE — Telephone Encounter (Signed)
Prescription refill request for Eliquis received.  Last office visit: [place], 04/03/2019 Scr: 0.7, 01/05/2019 Age: 69 y.o. Weight: 77.1 kg   Prescription refill sent.

## 2019-07-28 ENCOUNTER — Other Ambulatory Visit: Payer: Self-pay | Admitting: Nurse Practitioner

## 2019-08-10 DIAGNOSIS — R109 Unspecified abdominal pain: Secondary | ICD-10-CM | POA: Diagnosis not present

## 2019-08-10 DIAGNOSIS — J449 Chronic obstructive pulmonary disease, unspecified: Secondary | ICD-10-CM | POA: Diagnosis not present

## 2019-09-10 DIAGNOSIS — D485 Neoplasm of uncertain behavior of skin: Secondary | ICD-10-CM | POA: Diagnosis not present

## 2019-09-10 DIAGNOSIS — L720 Epidermal cyst: Secondary | ICD-10-CM | POA: Diagnosis not present

## 2019-09-10 DIAGNOSIS — L821 Other seborrheic keratosis: Secondary | ICD-10-CM | POA: Diagnosis not present

## 2019-09-10 DIAGNOSIS — D1801 Hemangioma of skin and subcutaneous tissue: Secondary | ICD-10-CM | POA: Diagnosis not present

## 2019-09-10 DIAGNOSIS — D225 Melanocytic nevi of trunk: Secondary | ICD-10-CM | POA: Diagnosis not present

## 2019-09-10 DIAGNOSIS — H02824 Cysts of left upper eyelid: Secondary | ICD-10-CM | POA: Diagnosis not present

## 2019-09-14 ENCOUNTER — Other Ambulatory Visit: Payer: Self-pay | Admitting: Family Medicine

## 2019-09-14 DIAGNOSIS — Z1231 Encounter for screening mammogram for malignant neoplasm of breast: Secondary | ICD-10-CM

## 2019-09-21 DIAGNOSIS — L239 Allergic contact dermatitis, unspecified cause: Secondary | ICD-10-CM | POA: Diagnosis not present

## 2019-09-25 ENCOUNTER — Telehealth (HOSPITAL_COMMUNITY): Payer: Self-pay

## 2019-09-25 ENCOUNTER — Other Ambulatory Visit: Payer: Self-pay | Admitting: Nurse Practitioner

## 2019-09-25 NOTE — Telephone Encounter (Signed)
I sent in her Tikosyn medication to her pharmacy with no additional refills. Patient needs to come in for a follow appointment to see Ceasar Lund.  Left message for patient to call back.

## 2019-10-07 DIAGNOSIS — Z1152 Encounter for screening for COVID-19: Secondary | ICD-10-CM | POA: Diagnosis not present

## 2019-10-07 DIAGNOSIS — H9191 Unspecified hearing loss, right ear: Secondary | ICD-10-CM | POA: Diagnosis not present

## 2019-10-10 ENCOUNTER — Other Ambulatory Visit: Payer: Self-pay | Admitting: Nurse Practitioner

## 2019-10-12 ENCOUNTER — Other Ambulatory Visit: Payer: Self-pay | Admitting: Family Medicine

## 2019-10-12 DIAGNOSIS — M810 Age-related osteoporosis without current pathological fracture: Secondary | ICD-10-CM

## 2019-10-19 ENCOUNTER — Ambulatory Visit: Payer: Medicare PPO

## 2019-10-21 ENCOUNTER — Encounter (HOSPITAL_COMMUNITY): Payer: Self-pay | Admitting: Nurse Practitioner

## 2019-10-21 ENCOUNTER — Other Ambulatory Visit: Payer: Self-pay

## 2019-10-21 ENCOUNTER — Ambulatory Visit (HOSPITAL_COMMUNITY)
Admission: RE | Admit: 2019-10-21 | Discharge: 2019-10-21 | Disposition: A | Discharge status: Home / Self Care | Payer: Medicare PPO | Source: Ambulatory Visit | Attending: Nurse Practitioner | Admitting: Nurse Practitioner

## 2019-10-21 VITALS — BP 140/68 | HR 53 | Ht 67.0 in | Wt 162.6 lb

## 2019-10-21 DIAGNOSIS — I2510 Atherosclerotic heart disease of native coronary artery without angina pectoris: Secondary | ICD-10-CM | POA: Diagnosis not present

## 2019-10-21 DIAGNOSIS — E785 Hyperlipidemia, unspecified: Secondary | ICD-10-CM | POA: Diagnosis not present

## 2019-10-21 DIAGNOSIS — Z882 Allergy status to sulfonamides status: Secondary | ICD-10-CM | POA: Insufficient documentation

## 2019-10-21 DIAGNOSIS — I10 Essential (primary) hypertension: Secondary | ICD-10-CM | POA: Insufficient documentation

## 2019-10-21 DIAGNOSIS — Z7984 Long term (current) use of oral hypoglycemic drugs: Secondary | ICD-10-CM | POA: Diagnosis not present

## 2019-10-21 DIAGNOSIS — I4891 Unspecified atrial fibrillation: Secondary | ICD-10-CM | POA: Insufficient documentation

## 2019-10-21 DIAGNOSIS — Z888 Allergy status to other drugs, medicaments and biological substances status: Secondary | ICD-10-CM | POA: Diagnosis not present

## 2019-10-21 DIAGNOSIS — Z7982 Long term (current) use of aspirin: Secondary | ICD-10-CM | POA: Diagnosis not present

## 2019-10-21 DIAGNOSIS — Z791 Long term (current) use of non-steroidal anti-inflammatories (NSAID): Secondary | ICD-10-CM | POA: Diagnosis not present

## 2019-10-21 DIAGNOSIS — Z7901 Long term (current) use of anticoagulants: Secondary | ICD-10-CM | POA: Insufficient documentation

## 2019-10-21 DIAGNOSIS — M159 Polyosteoarthritis, unspecified: Secondary | ICD-10-CM | POA: Insufficient documentation

## 2019-10-21 DIAGNOSIS — F419 Anxiety disorder, unspecified: Secondary | ICD-10-CM | POA: Insufficient documentation

## 2019-10-21 DIAGNOSIS — F17210 Nicotine dependence, cigarettes, uncomplicated: Secondary | ICD-10-CM | POA: Insufficient documentation

## 2019-10-21 DIAGNOSIS — E1140 Type 2 diabetes mellitus with diabetic neuropathy, unspecified: Secondary | ICD-10-CM | POA: Diagnosis not present

## 2019-10-21 DIAGNOSIS — I480 Paroxysmal atrial fibrillation: Secondary | ICD-10-CM

## 2019-10-21 DIAGNOSIS — Z79899 Other long term (current) drug therapy: Secondary | ICD-10-CM | POA: Insufficient documentation

## 2019-10-21 DIAGNOSIS — Z85828 Personal history of other malignant neoplasm of skin: Secondary | ICD-10-CM | POA: Insufficient documentation

## 2019-10-21 DIAGNOSIS — Z87442 Personal history of urinary calculi: Secondary | ICD-10-CM | POA: Insufficient documentation

## 2019-10-21 DIAGNOSIS — H9191 Unspecified hearing loss, right ear: Secondary | ICD-10-CM | POA: Diagnosis not present

## 2019-10-21 DIAGNOSIS — J449 Chronic obstructive pulmonary disease, unspecified: Secondary | ICD-10-CM | POA: Diagnosis not present

## 2019-10-21 DIAGNOSIS — Z881 Allergy status to other antibiotic agents status: Secondary | ICD-10-CM | POA: Diagnosis not present

## 2019-10-21 DIAGNOSIS — D6869 Other thrombophilia: Secondary | ICD-10-CM

## 2019-10-21 DIAGNOSIS — Z8673 Personal history of transient ischemic attack (TIA), and cerebral infarction without residual deficits: Secondary | ICD-10-CM | POA: Insufficient documentation

## 2019-10-21 LAB — CBC
HCT: 47.1 % — ABNORMAL HIGH (ref 36.0–46.0)
Hemoglobin: 15.7 g/dL — ABNORMAL HIGH (ref 12.0–15.0)
MCH: 32.4 pg (ref 26.0–34.0)
MCHC: 33.3 g/dL (ref 30.0–36.0)
MCV: 97.1 fL (ref 80.0–100.0)
Platelets: 184 10*3/uL (ref 150–400)
RBC: 4.85 MIL/uL (ref 3.87–5.11)
RDW: 12.7 % (ref 11.5–15.5)
WBC: 5.8 10*3/uL (ref 4.0–10.5)
nRBC: 0 % (ref 0.0–0.2)

## 2019-10-21 LAB — BASIC METABOLIC PANEL
Anion gap: 10 (ref 5–15)
BUN: 10 mg/dL (ref 8–23)
CO2: 30 mmol/L (ref 22–32)
Calcium: 9.1 mg/dL (ref 8.9–10.3)
Chloride: 101 mmol/L (ref 98–111)
Creatinine, Ser: 0.68 mg/dL (ref 0.44–1.00)
GFR calc Af Amer: >60 mL/min (ref >60)
GFR calc non Af Amer: >60 mL/min (ref >60)
Glucose, Bld: 194 mg/dL — ABNORMAL HIGH (ref 70–99)
Potassium: 3.9 mmol/L (ref 3.5–5.1)
Sodium: 141 mmol/L (ref 135–145)

## 2019-10-21 LAB — MAGNESIUM: Magnesium: 2 mg/dL (ref 1.7–2.4)

## 2019-10-21 NOTE — Progress Notes (Signed)
Patient ID: Melanie Lucas, female   DOB: 08-Sep-1950, 69 y.o.   MRN: [id number]     Primary Care Physician: Kristen Loader, FNP Referring Physician: [facility] f/u   Melanie Lucas is a 69 y.o. female with a h/o PAF previously managed with dronedarone, lacunar infarct, HTN, DM, CAD, and tobacco abuse. She was  Seen 11/09/15 and was noted to be back in atrial fibrillation with RVR. Decision was made to admit her for further evaluation and eliquis initiation followed by tikosyn loading.  Following admission, pt was placed on IV diltitazem and dronedarone was discontinued. Eliquis was started with a plan to perfom TEE and cardioversion after she had received 5 doses of eliquis. Rates were relatively stable and troponins were normal. She underwent TEE on 9/23 revealing no evidence of LAA thrombus. This was followed by DCCV using one synchronized shock at 200 joules with successful conversion to sinus bradycardia with intermittent junctional rhythm. Following cardioversion, she was loaded with Tikosyn 500 mcg q12 hrs. She  tolerated tikosyn loading well and has not had any further atrial fibrillation. Her QTc  remained relatively stable.  F/u in afib clinic, 01/05/19, after seeing Dr. Rayann Heman virtually  in September.  I have not seen pt since 2017. She states that she has not noted any recent afib. Remains on dofetilide 500 mcg bid and eliquis 5 mg bid for chadsvasc score of at least  7.   F/u in afib clinic, 10/21/19. She remains on Tikosyn and is staying in [place]. She continues to smoke. She is still grieving the death of her husband from [place]  last February. She is in SR today with acceptable qt.   Today, she denies symptoms of palpitations, chest pain, shortness of breath, orthopnea, PND, lower extremity edema, dizziness, presyncope, syncope, or neurologic sequela. The patient is tolerating medications without difficulties and is otherwise without complaint today.   Past Medical History:  Diagnosis Date   . Anxiety   . Cancer (HCC)    Skin- leg   . Chronic acquired lymphedema    a. R>L;  b. 03/2012 Neg LE U/S for DVT. Legs since age 69  . Complication of anesthesia    woke up during colonoscopy, Lithrostrixpy, Biospy  . COPD (chronic obstructive pulmonary disease) ([place])   . Coronary artery disease   . CVD (cardiovascular disease)   . Deaf, right   . Diabetes mellitus    Type II  . Diabetic neuropathy ([place])   . Dysrhythmia    afib  . Elevated TSH    a. 10/2012 - inst to f/u PCP.  . Full dentures   . Hematuria    a. while on pradaxa,  she reports that she has seen Dr Margie Ege and had low risk cystoscopy  . History of kidney stones   . Hyperlipidemia   . Hypertension    patient denies  . Incontinence    prior to urinating  . Lacunar infarction ([place])    a. 02/2009 non-acute Lacunar infarct of the right thalamus noted on MRI of brain.  . Left carotid stenosis   . Lymphedema of extremity    had this problem, since she was a teenager. especially seen in R LE  . Meniere disease   . Myocardial infarction ([place])   . Obesity   . Osteoarthritis    cervical & lumbar region, knees, hands cramp also   . PAF with post-termination pauses    a. on dronedarone;  b. CHA2DS2VASc = 5 (HTN, DM, h/o  lacunar infarct on MRI, Female) ->refused oral anticoagulation after h/o hematuria on pradaxa;  c. 02/2012 Echo: EF 55-60%, mildly dil LA. D. Recurrent PAF 10/2012 after only taking Multaq 1x/day - spont conv to NSR, placed back on BID Multaq/eliquis;  e. 10/2014 Multaq d/c'd->tikosyn initiated.  . Shortness of breath dyspnea    with exertion  . Stroke [facility])    2 mini strokes  . Tobacco abuse   . Vision abnormalities    Past Surgical History:  Procedure Laterality Date  . ANTERIOR CERVICAL CORPECTOMY N/A 02/21/2016   Procedure: ANTERIOR CERVICAL CORPECTOMY ANS FUSION CERVICAL SIX , ANTERIOR PLATING CERVICAL FIVE-SEVEN;  Surgeon: Consuella Lose, MD;  Location: [facility];  Service: Neurosurgery;  Laterality:  N/A;  . ANTERIOR FUSION CERVICAL SPINE  02/21/2016  . Biopsy of adrenal glands    . BREAST BIOPSY Bilateral    benign results, both breasts   . CARDIAC CATHETERIZATION  2014   x1 stent placed, done in [place]   . CARDIOVERSION N/A 11/11/2014   Procedure: CARDIOVERSION;  Surgeon: Larey Dresser, MD;  Location: [place];  Service: Cardiovascular;  Laterality: N/A;  . CAROTID ENDARTERECTOMY Left 07/29/2017  . COLONOSCOPY W/ POLYPECTOMY    . CORONARY STENT PLACEMENT    . ENDARTERECTOMY Left 07/29/2017   Procedure: ENDARTERECTOMY CAROTID LEFT;  Surgeon: Rosetta Posner, MD;  Location: [facility];  Service: Vascular;  Laterality: Left;  . PATCH ANGIOPLASTY Left 07/29/2017   Procedure: PATCH ANGIOPLASTY USING HEMASHIELD GOLD VASCULAR PATCH;  Surgeon: Rosetta Posner, MD;  Location: [place] [facility];  Service: Vascular;  Laterality: Left;  . TEE WITHOUT CARDIOVERSION N/A 11/11/2014   Procedure: TRANSESOPHAGEAL ECHOCARDIOGRAM (TEE);  Surgeon: Larey Dresser, MD;  Location: [facility] ENDOSCOPY;  Service: Cardiovascular;  Laterality: N/A;    Current Outpatient Medications  Medication Sig Dispense Refill  . albuterol (PROVENTIL HFA;VENTOLIN HFA) 108 (90 Base) MCG/ACT inhaler Inhale 2 puffs into the lungs every 6 (six) hours as needed for wheezing or shortness of breath.     [name] aspirin EC 81 MG tablet Take 1 tablet (81 mg total) by mouth daily. 30 tablet 1  . diazepam (VALIUM) 2 MG tablet as needed. Only for [place]    . diltiazem (TIAZAC) 120 MG 24 hr capsule TAKE 1 CAPSULE (120 MG TOTAL) BY MOUTH DAILY.    [name] dofetilide (TIKOSYN) 500 MCG capsule TAKE 1 CAPSULE (500 MCG TOTAL) BY MOUTH 2 (TWO) TIMES DAILY. APPT REQUIRED FOR REFILLS [phone] 60 capsule 0  . DULoxetine (CYMBALTA) 60 MG capsule Take 60 mg by mouth daily.     [name] ELIQUIS 5 MG TABS tablet TAKE 1 TABLET BY MOUTH TWICE A DAY 180 tablet 1  . Empagliflozin-metFORMIN HCl (SYNJARDY) 12.06-998 MG TABS Take 1 tablet by mouth 2 (two) times daily.    . furosemide  (LASIX) 20 MG tablet Take 20 mg by mouth as needed for edema.     [name] ibuprofen (ADVIL,MOTRIN) 800 MG tablet Take 800 mg by mouth 3 (three) times daily as needed for mild pain.     [name] losartan (COZAAR) 25 MG tablet TAKE 1 TABLET BY MOUTH EVERY DAY 90 tablet 3  . Multiple Vitamin (MULTIVITAMIN) tablet Take 1 tablet by mouth at bedtime.     . polyethylene glycol (MIRALAX / GLYCOLAX) 17 g packet Take 17 g by mouth daily as needed.    . repaglinide (PRANDIN) 2 MG tablet     . rosuvastatin (CRESTOR) 5 MG tablet Take 5 mg by mouth at bedtime.  No current facility-administered medications for this encounter.    Allergies  Allergen Reactions  . Avelox [Moxifloxacin]     Unknown reaction   . Chantix [Varenicline]     nightmares  . Fosamax [Alendronate]     Pain all over  . Pradaxa [Dabigatran Etexilate Mesylate]     Extreme bleeding  . Statins Nausea And Vomiting and Other (See Comments)    Muscle pain, Dizziness (intolerance)  . Sulfamethoxazole Nausea And Vomiting    Dizziness (intolerance)  . Sulfonamide Derivatives Hives    Nausea vertigo  . Ciprofloxacin Rash  . Gabapentin Rash    burning  . Tape Itching and Rash    Please use "paper" tape only    Social History   Socioeconomic History  . Marital status: Married    Spouse name: Coralee Pesa  . Number of children: 2  . Years of education: 70  . Highest education level: Master's degree (e.g., MA, MS, MEng, MEd, MSW, MBA)  Occupational History  . Occupation: retired    Comment: Pharmacist, hospital - art  Tobacco Use  . Smoking status: Current Every Day Smoker    Packs/day: 1.00    Years: 55.00    Pack years: 55.00    Types: Cigarettes  . Smokeless tobacco: Never Used  Vaping Use  . Vaping Use: Never used  Substance and Sexual Activity  . Alcohol use: Yes    Alcohol/week: 1.0 standard drink    Types: 1 Glasses of wine per week  . Drug use: No  . Sexual activity: Yes  Other Topics Concern  . Not on file  Social History Narrative    She operates an entertainment business   Social Determinants of Radio broadcast assistant Strain:   . Difficulty of Paying Living Expenses: Not on file  Food Insecurity:   . Worried About Charity fundraiser in the Last Year: Not on file  . Ran Out of Food in the Last Year: Not on file  Transportation Needs:   . Lack of Transportation (Medical): Not on file  . Lack of Transportation (Non-Medical): Not on file  Physical Activity:   . Days of Exercise per Week: Not on file  . Minutes of Exercise per Session: Not on file  Stress:   . Feeling of Stress : Not on file  Social Connections:   . Frequency of Communication with Friends and Family: Not on file  . Frequency of Social Gatherings with Friends and Family: Not on file  . Attends Religious Services: Not on file  . Active Member of Clubs or Organizations: Not on file  . Attends Archivist Meetings: Not on file  . Marital Status: Not on file  Intimate Partner Violence:   . Fear of Current or Ex-Partner: Not on file  . Emotionally Abused: Not on file  . Physically Abused: Not on file  . Sexually Abused: Not on file    Family History  Problem Relation Age of Onset  . Heart attack Father   . Coronary artery disease Father        strong family hx  . Bladder Cancer Mother        bladder  . Leukemia Brother   . Breast cancer Maternal Aunt   . Breast cancer Paternal Aunt   . Breast cancer Maternal Aunt     ROS- All systems are reviewed and negative except as per the HPI above  Physical Exam: Vitals:   10/21/19 1339  BP: 140/68  Pulse: [name])  53  Weight: 73.8 kg  Height: 5\' 7"  (1.702 m)    GEN- The patient is well appearing, alert and oriented x 3 today.   Head- normocephalic, atraumatic Eyes-  Sclera clear, conjunctiva pink Ears- hearing intact Oropharynx- clear Neck- supple, no JVP Lymph- no cervical lymphadenopathy Lungs- Clear to ausculation bilaterally, normal work of breathing Heart- Regular rate  and rhythm, no murmurs, rubs or gallops, PMI not laterally displaced GI- soft, NT, ND, + BS Extremities- no clubbing, cyanosis, or edema MS- no significant deformity or atrophy Skin- no rash or lesion Psych- euthymic mood, full affect Neuro- strength and sensation are intact  EKG- sinus brady at 53 bpm, pr int 80  ms, qrs 120  ms, qtc 424  ms  Epic records reviewed  Echo-- Left ventricle: The cavity size was normal. Systolic function was normal. The estimated ejection fraction was in the range of 50% to 55%. Wall motion was normal; there were no regional wall motion abnormalities. The study was not technically sufficient to allow evaluation of LV diastolic dysfunction due to atrial fibrillation. - Aortic valve: Trileaflet; normal thickness leaflets. There was no regurgitation. - Aortic root: The aortic root was normal in size. - Ascending aorta: The ascending aorta was normal in size. - Mitral valve: Structurally normal valve. There was mild regurgitation. - Left atrium: The atrium was normal in size. - Right ventricle: The cavity size was normal. Wall thickness was normal. Systolic function was normal. - Tricuspid valve: There was mild-moderate regurgitation. - Pulmonary arteries: Systolic pressure was mildly increased. PA peak pressure: 37 mm Hg (S). - Inferior vena cava: The vessel was normal in size. - Pericardium, extracardiac: There was no pericardial effusion.   Assessment and Plan: 1. Afib In SR on tikosyn 500 mcg bid qtc is stable  Continue eliquis/ASA chadsvasc score at least 7 Bmet/mag/cbc today   2. Tobacco abuse Encouraged cessation  3. HTN  Stable  Avoid salt    4. CAD  No  anginal symptoms   F/u  In afib clinic in 6 months   Butch Penny C. Ailie Gage, [facility] [address] [place], [place] [address] [phone]

## 2019-11-09 ENCOUNTER — Other Ambulatory Visit: Payer: Self-pay

## 2019-11-09 ENCOUNTER — Ambulatory Visit
Admission: RE | Admit: 2019-11-09 | Discharge: 2019-11-09 | Disposition: A | Discharge status: Home / Self Care | Payer: Medicare PPO | Source: Ambulatory Visit | Attending: Family Medicine | Admitting: Family Medicine

## 2019-11-09 DIAGNOSIS — Z1231 Encounter for screening mammogram for malignant neoplasm of breast: Secondary | ICD-10-CM | POA: Diagnosis not present

## 2019-11-09 DIAGNOSIS — Z780 Asymptomatic menopausal state: Secondary | ICD-10-CM | POA: Diagnosis not present

## 2019-11-09 DIAGNOSIS — M85852 Other specified disorders of bone density and structure, left thigh: Secondary | ICD-10-CM | POA: Diagnosis not present

## 2019-11-09 DIAGNOSIS — M810 Age-related osteoporosis without current pathological fracture: Secondary | ICD-10-CM

## 2019-12-03 DIAGNOSIS — R413 Other amnesia: Secondary | ICD-10-CM | POA: Diagnosis not present

## 2019-12-03 DIAGNOSIS — E1140 Type 2 diabetes mellitus with diabetic neuropathy, unspecified: Secondary | ICD-10-CM | POA: Diagnosis not present

## 2019-12-03 DIAGNOSIS — R42 Dizziness and giddiness: Secondary | ICD-10-CM | POA: Diagnosis not present

## 2019-12-03 DIAGNOSIS — F411 Generalized anxiety disorder: Secondary | ICD-10-CM | POA: Diagnosis not present

## 2019-12-03 DIAGNOSIS — F3341 Major depressive disorder, recurrent, in partial remission: Secondary | ICD-10-CM | POA: Diagnosis not present

## 2019-12-03 DIAGNOSIS — Z8673 Personal history of transient ischemic attack (TIA), and cerebral infarction without residual deficits: Secondary | ICD-10-CM | POA: Diagnosis not present

## 2019-12-03 DIAGNOSIS — R809 Proteinuria, unspecified: Secondary | ICD-10-CM | POA: Diagnosis not present

## 2019-12-03 DIAGNOSIS — D6869 Other thrombophilia: Secondary | ICD-10-CM | POA: Diagnosis not present

## 2019-12-03 DIAGNOSIS — J449 Chronic obstructive pulmonary disease, unspecified: Secondary | ICD-10-CM | POA: Diagnosis not present

## 2019-12-04 ENCOUNTER — Other Ambulatory Visit: Payer: Self-pay | Admitting: Family Medicine

## 2019-12-04 DIAGNOSIS — R42 Dizziness and giddiness: Secondary | ICD-10-CM

## 2019-12-04 DIAGNOSIS — R413 Other amnesia: Secondary | ICD-10-CM

## 2019-12-04 DIAGNOSIS — R296 Repeated falls: Secondary | ICD-10-CM

## 2019-12-04 DIAGNOSIS — Z8673 Personal history of transient ischemic attack (TIA), and cerebral infarction without residual deficits: Secondary | ICD-10-CM

## 2019-12-07 ENCOUNTER — Ambulatory Visit
Admission: RE | Admit: 2019-12-07 | Discharge: 2019-12-07 | Disposition: A | Discharge status: Home / Self Care | Payer: Medicare PPO | Source: Ambulatory Visit | Attending: Family Medicine | Admitting: Family Medicine

## 2019-12-07 DIAGNOSIS — G9389 Other specified disorders of brain: Secondary | ICD-10-CM | POA: Diagnosis not present

## 2019-12-07 DIAGNOSIS — R410 Disorientation, unspecified: Secondary | ICD-10-CM | POA: Diagnosis not present

## 2019-12-07 DIAGNOSIS — I7090 Unspecified atherosclerosis: Secondary | ICD-10-CM | POA: Diagnosis not present

## 2019-12-07 DIAGNOSIS — Z8673 Personal history of transient ischemic attack (TIA), and cerebral infarction without residual deficits: Secondary | ICD-10-CM

## 2019-12-07 DIAGNOSIS — R413 Other amnesia: Secondary | ICD-10-CM

## 2019-12-07 DIAGNOSIS — R296 Repeated falls: Secondary | ICD-10-CM

## 2019-12-07 DIAGNOSIS — R42 Dizziness and giddiness: Secondary | ICD-10-CM

## 2019-12-18 DIAGNOSIS — R102 Pelvic and perineal pain: Secondary | ICD-10-CM | POA: Diagnosis not present

## 2019-12-18 DIAGNOSIS — N3941 Urge incontinence: Secondary | ICD-10-CM | POA: Diagnosis not present

## 2019-12-18 DIAGNOSIS — N200 Calculus of kidney: Secondary | ICD-10-CM | POA: Diagnosis not present

## 2019-12-31 DIAGNOSIS — M6281 Muscle weakness (generalized): Secondary | ICD-10-CM | POA: Diagnosis not present

## 2019-12-31 DIAGNOSIS — R1032 Left lower quadrant pain: Secondary | ICD-10-CM | POA: Diagnosis not present

## 2019-12-31 DIAGNOSIS — R102 Pelvic and perineal pain: Secondary | ICD-10-CM | POA: Diagnosis not present

## 2019-12-31 DIAGNOSIS — M6289 Other specified disorders of muscle: Secondary | ICD-10-CM | POA: Diagnosis not present

## 2019-12-31 DIAGNOSIS — N200 Calculus of kidney: Secondary | ICD-10-CM | POA: Diagnosis not present

## 2019-12-31 DIAGNOSIS — M62838 Other muscle spasm: Secondary | ICD-10-CM | POA: Diagnosis not present

## 2020-01-07 ENCOUNTER — Telehealth: Payer: Self-pay | Admitting: *Deleted

## 2020-01-07 DIAGNOSIS — K5904 Chronic idiopathic constipation: Secondary | ICD-10-CM | POA: Diagnosis not present

## 2020-01-07 DIAGNOSIS — Z7901 Long term (current) use of anticoagulants: Secondary | ICD-10-CM | POA: Diagnosis not present

## 2020-01-07 DIAGNOSIS — Z86010 Personal history of colonic polyps: Secondary | ICD-10-CM | POA: Diagnosis not present

## 2020-01-07 NOTE — Telephone Encounter (Signed)
Left message to call back.  Kerin Ransom PA-C 01/07/2020 4:01 PM

## 2020-01-07 NOTE — Telephone Encounter (Signed)
Pharmacy please comment on holding anticoagulation in this patient and then will call her for cardiac clearance   thank you  Kerin Ransom PA-C 01/07/2020 1:36 PM

## 2020-01-07 NOTE — Telephone Encounter (Signed)
   [facility] Pre-operative Risk Assessment    HEARTCARE STAFF: - Please ensure there is not already an duplicate clearance open for this procedure. - Under Visit Info/Reason for Call, type in Other and utilize the format Clearance MM/DD/YY or Clearance TBD. Do not use dashes or single digits. - If request is for dental extraction, please clarify the # of teeth to be extracted.  Request for surgical clearance:  1. What type of surgery is being performed? COLONOSCOPY   2. When is this surgery scheduled? NOT SURE IF REQUEST STATES 02/29/20 OR 03/03/20   3. What type of clearance is required (medical clearance vs. Pharmacy clearance to hold med vs. Both)? BOTH  4. Are there any medications that need to be held prior to surgery and how long? ELIQUIS   5. Practice name and name of physician performing surgery? [facility]; DR. Therisa Doyne   6. What is the office phone number? [phone]   7.   What is the office fax number? [phone]  8.   Anesthesia type (None, local, MAC, general) ? NOT LISTED   Melanie Lucas 01/07/2020, 12:49 PM  _________________________________________________________________   (provider comments below)

## 2020-01-07 NOTE — Telephone Encounter (Signed)
Patient with diagnosis of afib on Eliquis for anticoagulation.    Procedure: colonoscopy Date of procedure: January 2022  CHA2DS2-VASc Score = 7  This indicates a 11.2% annual risk of stroke. The patient's score is based upon: CHF History: 0 HTN History: 1 Diabetes History: 1 Stroke History: 2 (recurrent infarcts and TIAs) Vascular Disease History: 1 Age Score: 1 Gender Score: 1   CrCl 58mL/min Platelet count 184K  Per office protocol, patient can hold Eliquis for 1 day prior to procedure. Resume as soon as safely possible due to elevated cardiac risk.

## 2020-01-11 ENCOUNTER — Other Ambulatory Visit (HOSPITAL_COMMUNITY): Payer: Self-pay | Admitting: Internal Medicine

## 2020-01-11 NOTE — Telephone Encounter (Signed)
Pt last saw Roderic Palau, NP on 10/21/19, last labs 10/21/19 Creat 0.68, age 69, weight 73.8kg, based on specified criteria pt is on appropriate dosage of Eliquis 5mg  BID.  Will refill rx.

## 2020-01-11 NOTE — Telephone Encounter (Signed)
   Primary Cardiologist: Ena Dawley, MD  Chart reviewed as part of pre-operative protocol coverage. Patient was recently seen by Roderic Palau, NP in the [facility], at which time she was doing well from a cardiac standpoint. Patient was contacted today for further pre-op evaluation at which time she reported doing well since last visit. She has chronic shortness of breath with her COPD but states this is stable. She also has some dizziness with her Meniere's disease but states this is stable as well. No chest pain, orthopnea, lower extremity edema, palpitations, or syncope.  Given past medical history and time since last visit, based on ACC/AHA guidelines, AGAPITA SAVARINO be at acceptable risk for the planned procedure without further cardiovascular testing.   Per Pharmacy and office protocol, patient can hold Eliquis for 1 day prior to procedure. This should be restarted as soon as possible following procedure due to elevated cardiac risk.  The patient was advised that if she develops new symptoms prior to surgery to contact our office to arrange for a follow-up visit, and she verbalized understanding.  I will route this recommendation to the requesting party via Epic fax function and remove from pre-op pool.  Please call with questions.  Darreld Mclean, PA-C 01/11/2020, 9:21 AM

## 2020-02-06 ENCOUNTER — Other Ambulatory Visit: Payer: Self-pay | Admitting: Nurse Practitioner

## 2020-02-16 ENCOUNTER — Encounter: Payer: Self-pay | Admitting: Neurology

## 2020-02-16 ENCOUNTER — Institutional Professional Consult (permissible substitution): Payer: Medicare PPO | Admitting: Neurology

## 2020-02-27 IMAGING — CT CT ANGIO NECK
1 of 12 series · 5 of 33 positions shown · IV contrast (OMNI 350)
Comparison: Previous MRI from earlier the same day as well as
previous CTA from 06/23/2017.

CLINICAL DATA: Initial evaluation for acute stroke.

EXAM:
CT ANGIOGRAPHY HEAD AND NECK
TECHNIQUE: Multidetector CT imaging of the head and neck was performed using
the standard protocol during bolus administration of intravenous
contrast. Multiplanar CT image reconstructions and MIPs were
obtained to evaluate the vascular anatomy. Carotid stenosis
measurements (when applicable) are obtained utilizing NASCET
criteria, using the distal internal carotid diameter as the
denominator.
CONTRAST:  75mL [id number]-[id number] IOPAMIDOL (J4LZYR-[id number]) INJECTION 76%

[Series 12: cta neck axial · axial · 0.39mm/px · z∈[-300,-53]mm · 5 of 371 slices shown]
[im 62/371  soft-tissue]
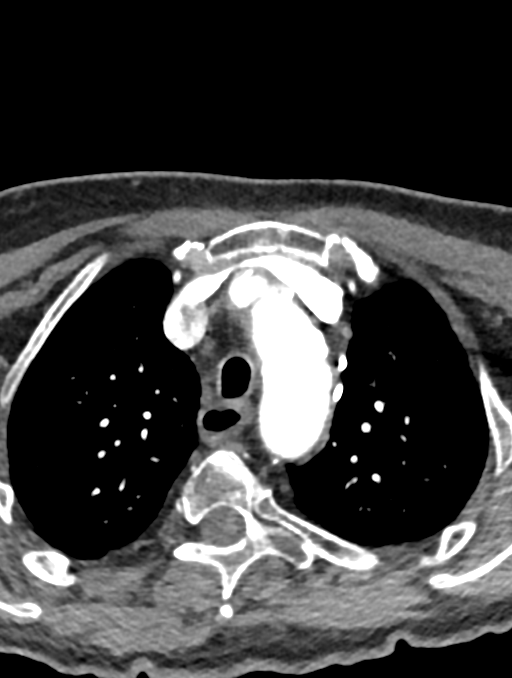
[im 124/371  bone]
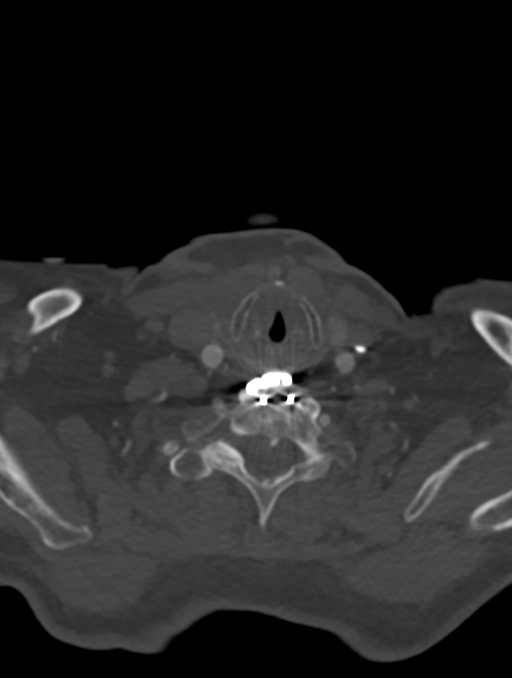
[im 186/371  soft-tissue]
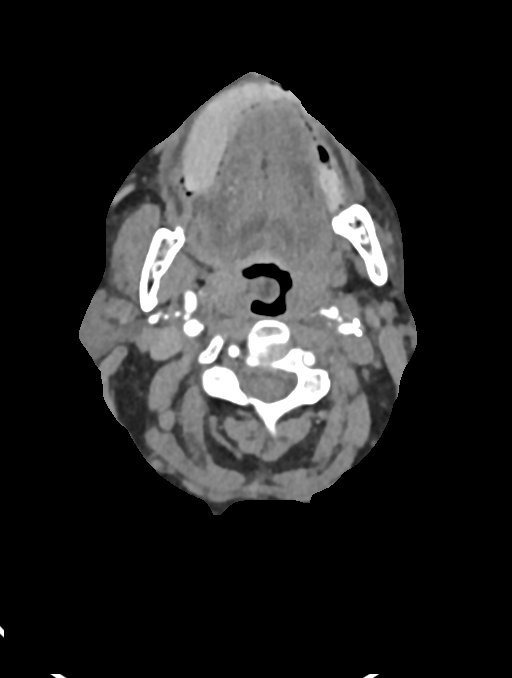
[im 247/371  bone]
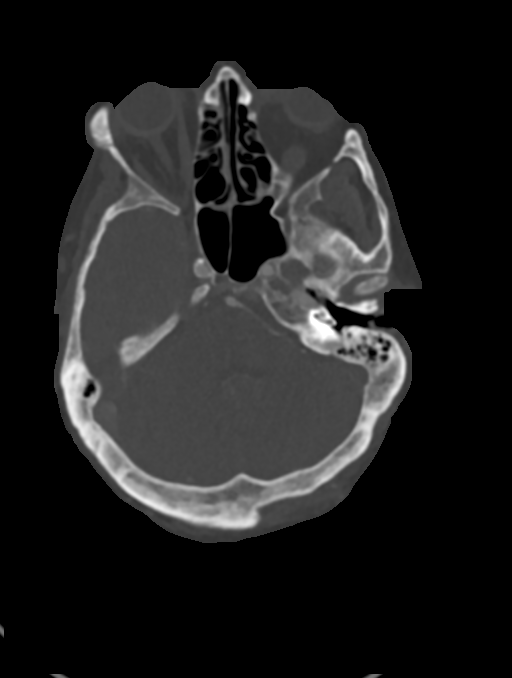
[im 309/371  soft-tissue]
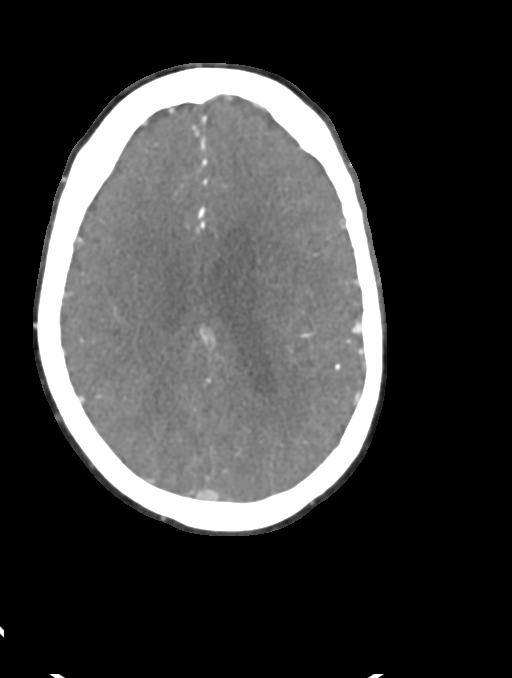

[5 of 33 positions shown; findings below may reference images not displayed]

FINDINGS: CT HEAD FINDINGS

Brain: Atrophy with moderate chronic small vessel ischemic disease
again noted. Subtle vaulting ischemic infarct at the posterior right
corona radiata grossly stable from previous MRI. No evidence for
associated hemorrhage.

No acute intracranial hemorrhage. No other acute large vessel
territory infarct. No mass lesion, midline shift or mass effect. No
hydrocephalus. No extra-axial fluid collection.

Vascular: No hyperdense vessel. Scattered vascular calcifications
noted within the carotid siphons.

Skull: Scalp soft tissues and calvarium within normal limits.

Sinuses: Mild scattered mucosal thickening within the ethmoidal air
cells. Visualized paranasal sinuses are otherwise clear. No mastoid
effusion.

Orbits: Globes orbital soft tissues demonstrate no acute finding.

CTA NECK FINDINGS

Aortic arch: Visualized aortic arch of normal caliber with normal 3
vessel morphology. Moderate atheromatous plaque within the visualize
arch and about the origin of the great vessels without
hemodynamically significant stenosis. Visualized subclavian arteries
widely patent. Prominent coronary artery calcifications partially
visualized.

Right carotid system: Right common carotid artery patent from its
origin to the bifurcation without stenosis. A centric calcified
plaque at the proximal right ICA with associated stenosis of up to
50% by NASCET criteria, similar to previous. Right ICA tortuous but
otherwise widely patent distally to the skull base without stenosis,
dissection, or occlusion.

Left carotid system: Left common carotid artery patent from its
origin to the bifurcation without stenosis. Sequelae of prior
endarterectomy noted at the left bifurcation/proximal left ICA. No
significant residual or recurrent stenosis about the endarterectomy
site. Left ICA widely patent distally to the skull base without
stenosis, dissection, or occlusion.

Vertebral arteries: Both of the vertebral arteries arise from the
subclavian arteries. Vertebral arteries widely patent within the
neck without stenosis, dissection, or occlusion.

Skeleton: No acute osseus abnormality. Mild wedging of the superior
endplate of T3 grossly stable from previous. No discrete lytic or
blastic osseous lesions. Prior ACDF at C5 through C7 with C6
corpectomy. Patient is edentulous.

Other neck: No acute soft tissue abnormality within the neck.
Salivary glands normal. Few scattered subcentimeter hypodense
thyroid nodules noted, of doubtful significance given size and
patient age. No adenopathy.

Upper chest: Visualized upper chest demonstrates no acute finding.
Cardiomegaly with coronary artery calcifications partially
visualized. Partially visualized lungs are grossly clear.

Review of the MIP images confirms the above findings

CTA HEAD FINDINGS

Anterior circulation: Mild scattered plaque within the petrous ICAs
bilaterally without significant narrowing on the right. Similar mild
stenosis on the left. Scattered atheromatous plaque throughout the
cavernous/supraclinoid ICAs with resultant mild multifocal
narrowing, also stable from previous. ICA termini widely patent. A1
segments well perfused. Normal anterior communicating artery.
Anterior cerebral arteries widely patent to their distal aspects
without stenosis. M1 segments widely patent bilaterally. Normal MCA
bifurcations. No proximal M2 occlusion. Distal MCA branches well
perfused and symmetric.

Posterior circulation: Vertebral arteries widely patent to the
vertebrobasilar junction without stenosis. Patent right PICA. Left
PICA not visualized. Dominant left AICA. Basilar widely patent to
its distal aspect. Superior cerebral arteries patent bilaterally.
Both of the posterior cerebral arteries primarily supplied via the
basilar. Mild atheromatous irregularity within the PCAs bilaterally
without high-grade stenosis. PCAs are patent to their distal
aspects.. Small right posterior communicating artery noted.

Venous sinuses: Grossly patent, although not well assessed due to
arterial timing the contrast bolus.

Anatomic variants: None significant.

Delayed phase: No abnormal enhancement.

Review of the MIP images confirms the above findings
IMPRESSION: 1. Negative CTA for emergent large vessel occlusion.
2. Postoperative changes from interval left carotid endarterectomy
without adverse features. No significant residual or recurrent
stenosis identified.
3. Atheromatous stenosis of approximately 50% at the right carotid
bifurcation, stable.
4. Moderate atherosclerotic change throughout the carotid siphons
without high-grade stenosis, similar to previous. No other
high-grade or correctable stenosis identified.

## 2020-03-01 DIAGNOSIS — Z01812 Encounter for preprocedural laboratory examination: Secondary | ICD-10-CM | POA: Diagnosis not present

## 2020-03-03 DIAGNOSIS — K5730 Diverticulosis of large intestine without perforation or abscess without bleeding: Secondary | ICD-10-CM | POA: Diagnosis not present

## 2020-03-03 DIAGNOSIS — K635 Polyp of colon: Secondary | ICD-10-CM | POA: Diagnosis not present

## 2020-03-03 DIAGNOSIS — Z86010 Personal history of colonic polyps: Secondary | ICD-10-CM | POA: Diagnosis not present

## 2020-03-04 DIAGNOSIS — R1031 Right lower quadrant pain: Secondary | ICD-10-CM | POA: Diagnosis not present

## 2020-03-04 DIAGNOSIS — M6281 Muscle weakness (generalized): Secondary | ICD-10-CM | POA: Diagnosis not present

## 2020-03-04 DIAGNOSIS — R1032 Left lower quadrant pain: Secondary | ICD-10-CM | POA: Diagnosis not present

## 2020-03-04 DIAGNOSIS — R102 Pelvic and perineal pain: Secondary | ICD-10-CM | POA: Diagnosis not present

## 2020-03-04 DIAGNOSIS — M62838 Other muscle spasm: Secondary | ICD-10-CM | POA: Diagnosis not present

## 2020-03-04 DIAGNOSIS — N3941 Urge incontinence: Secondary | ICD-10-CM | POA: Diagnosis not present

## 2020-03-09 IMAGING — CR DG CHEST 2V
2 series · 2 of 2 positions shown · non-contrast
Comparison: 06/23/2017

CLINICAL DATA: Hyperglycemia today. Shortness of breath. History of
COPD, diabetes, hypertension, current smoker.

EXAM:
CHEST - 2 VIEW

[w chest pa]
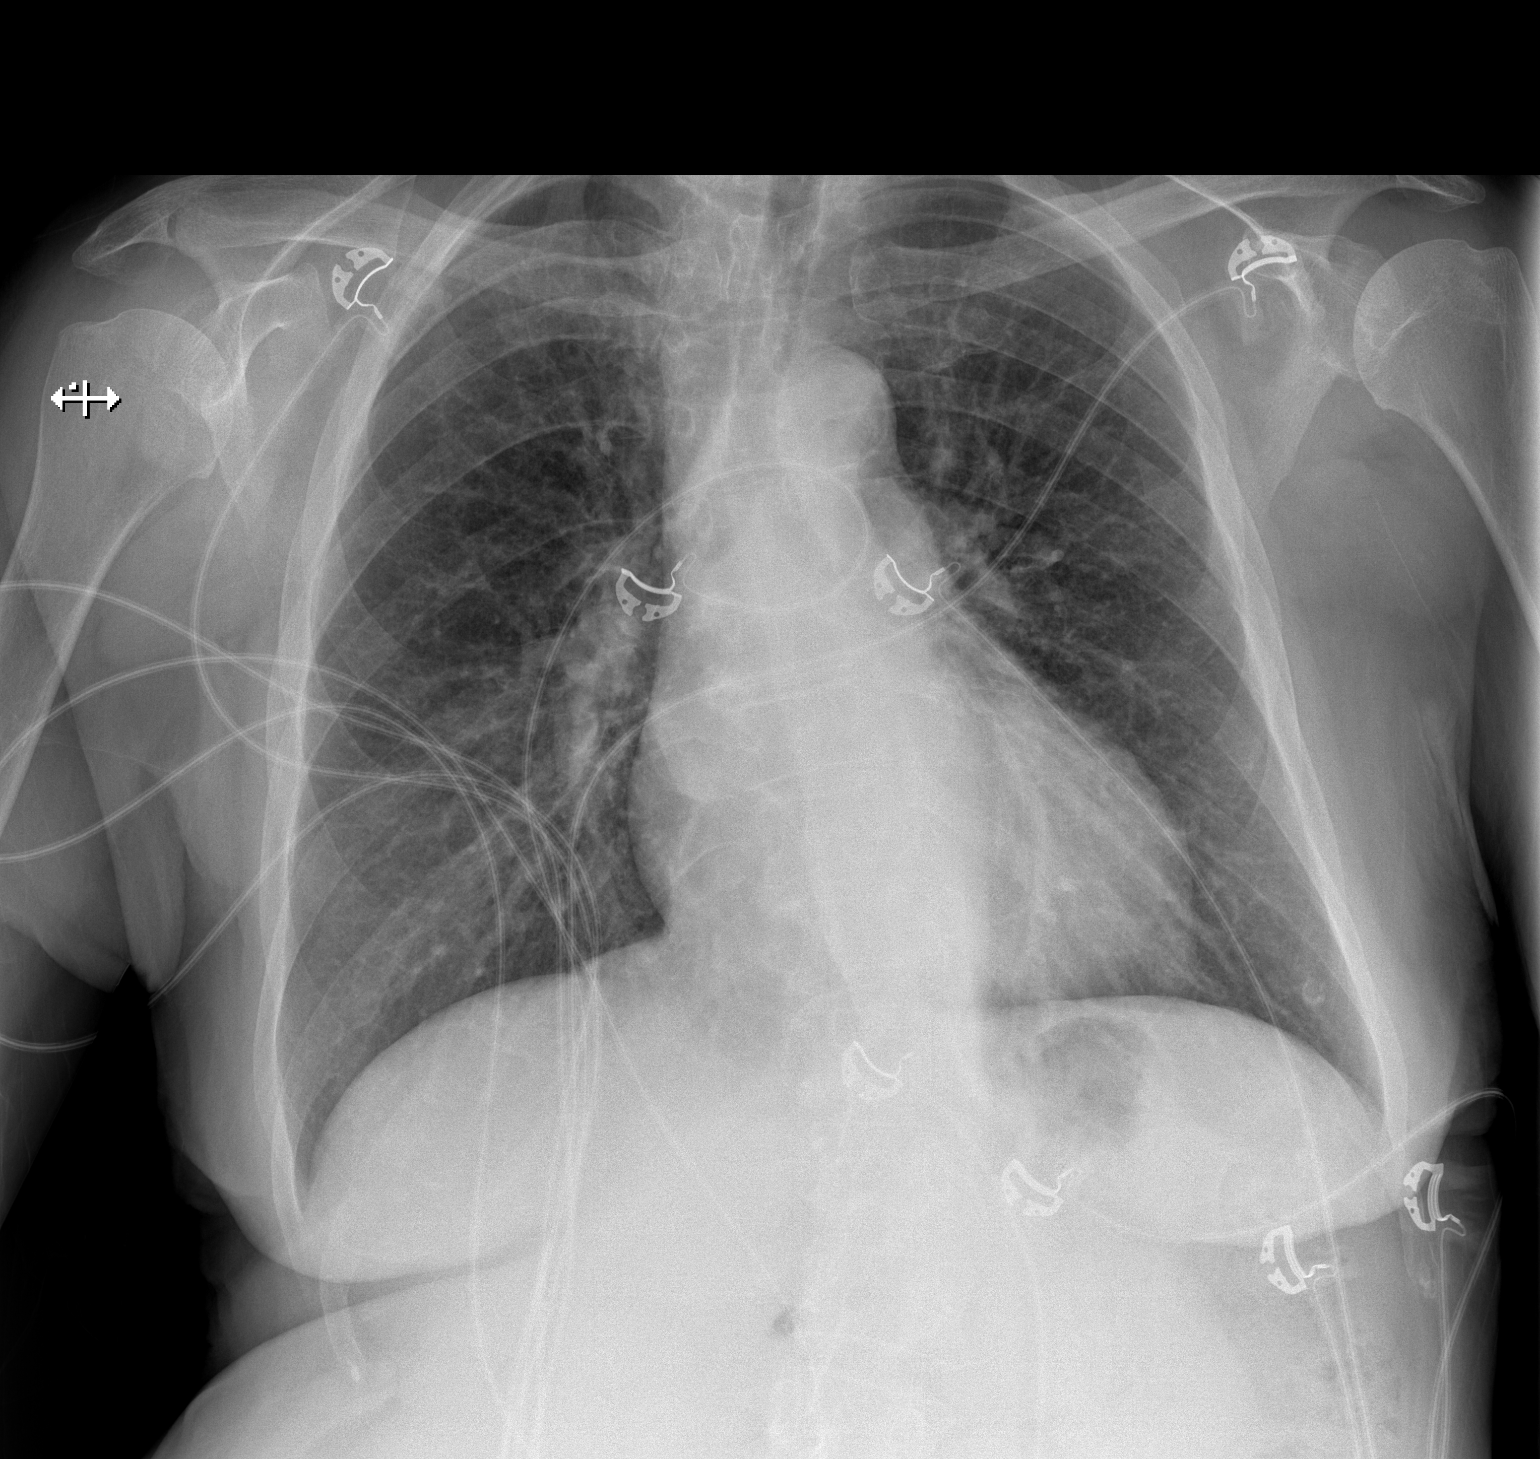

[w chest lat]
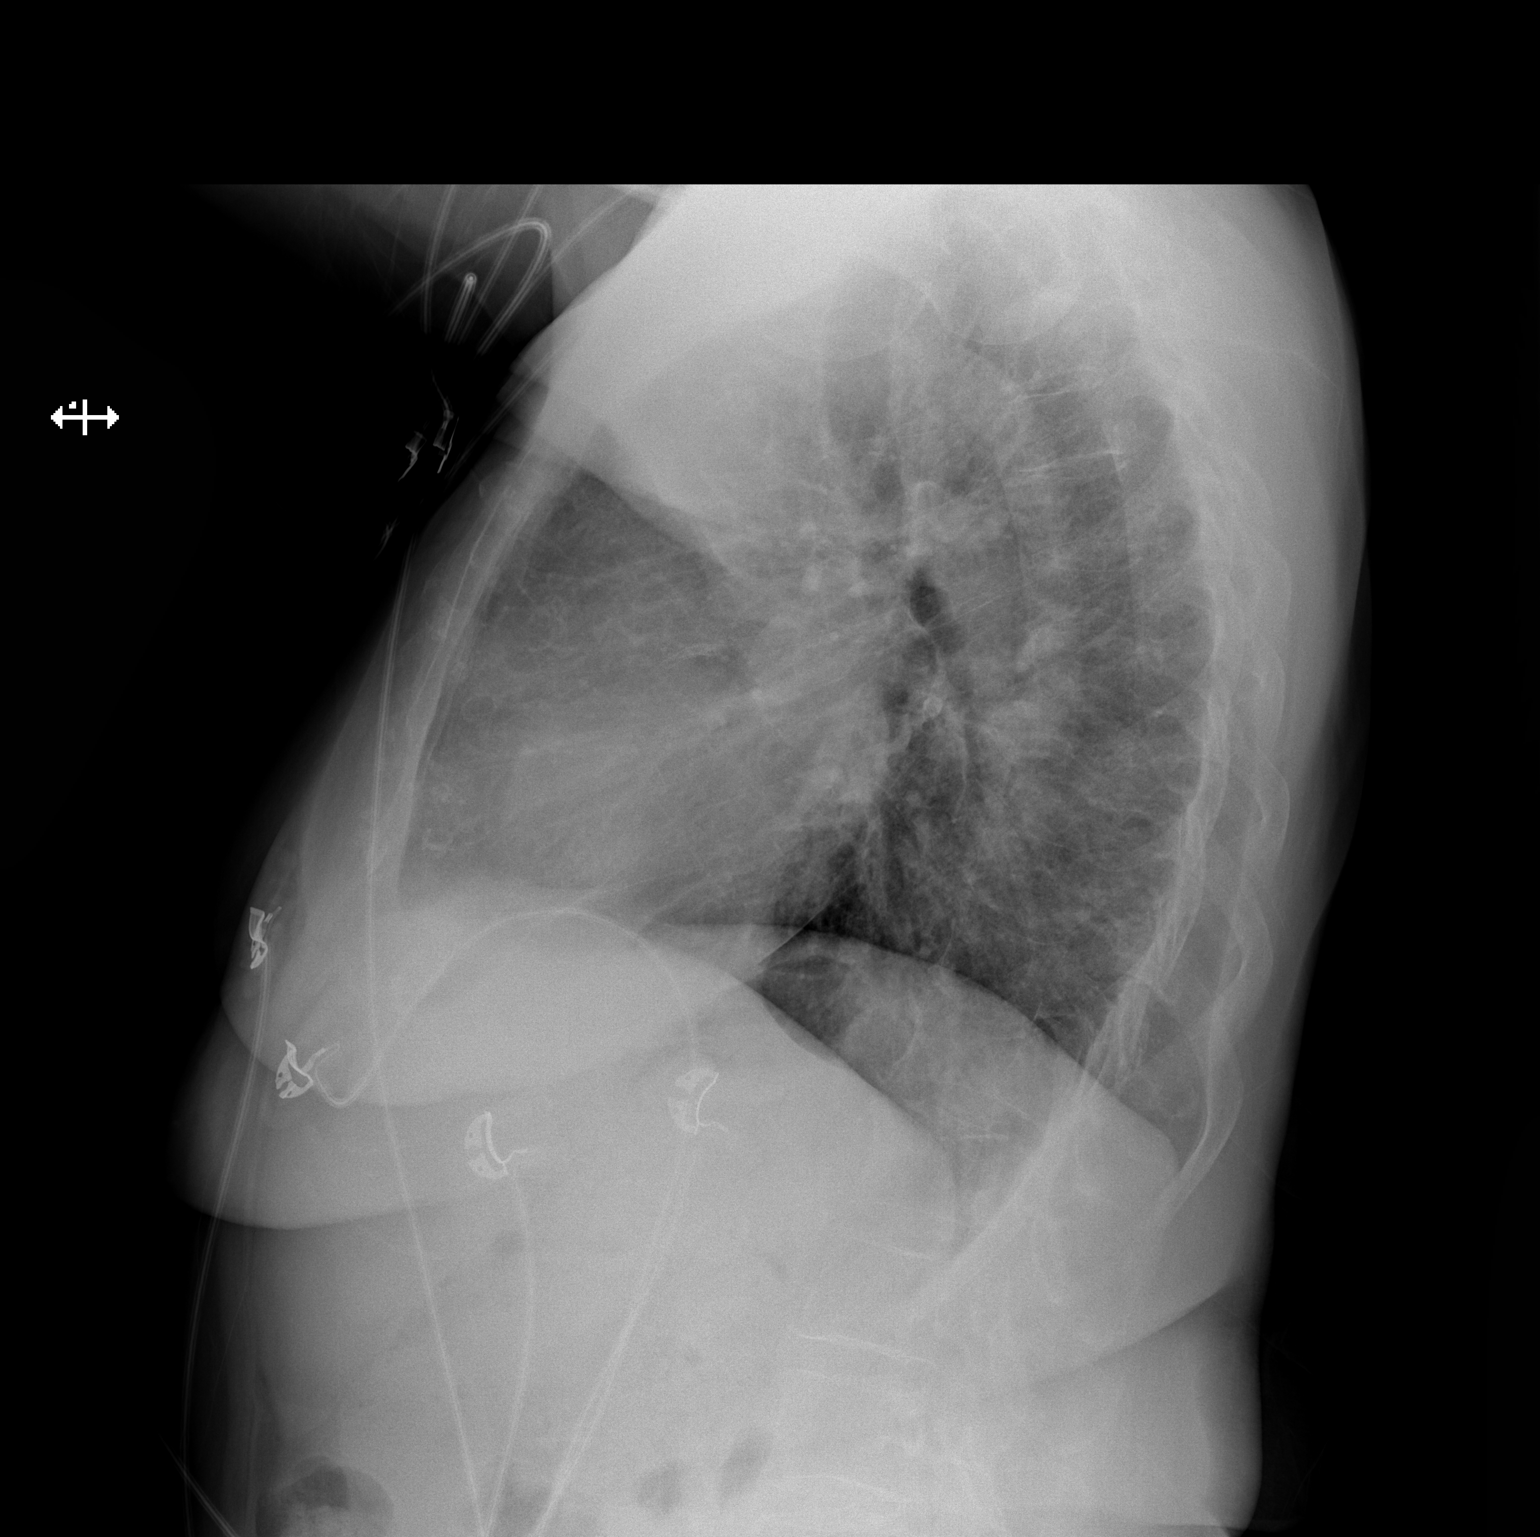

[2 of 2 positions shown; findings below may reference images not displayed]

FINDINGS: Normal heart size and pulmonary vascularity. No focal airspace
disease or consolidation in the lungs. No blunting of costophrenic
angles. No pneumothorax. Mediastinal contours appear intact.
Postoperative changes in the cervical spine. Thoracolumbar scoliosis
convex towards the left.
IMPRESSION: No active cardiopulmonary disease.

## 2020-03-17 ENCOUNTER — Institutional Professional Consult (permissible substitution): Payer: Medicare PPO | Admitting: Neurology

## 2020-03-18 DIAGNOSIS — N3941 Urge incontinence: Secondary | ICD-10-CM | POA: Diagnosis not present

## 2020-03-18 DIAGNOSIS — R102 Pelvic and perineal pain: Secondary | ICD-10-CM | POA: Diagnosis not present

## 2020-03-22 DIAGNOSIS — E279 Disorder of adrenal gland, unspecified: Secondary | ICD-10-CM | POA: Diagnosis not present

## 2020-03-22 DIAGNOSIS — E669 Obesity, unspecified: Secondary | ICD-10-CM | POA: Diagnosis not present

## 2020-03-22 DIAGNOSIS — I6930 Unspecified sequelae of cerebral infarction: Secondary | ICD-10-CM | POA: Diagnosis not present

## 2020-03-22 DIAGNOSIS — H903 Sensorineural hearing loss, bilateral: Secondary | ICD-10-CM | POA: Diagnosis not present

## 2020-03-22 DIAGNOSIS — R609 Edema, unspecified: Secondary | ICD-10-CM | POA: Diagnosis not present

## 2020-03-22 DIAGNOSIS — E1165 Type 2 diabetes mellitus with hyperglycemia: Secondary | ICD-10-CM | POA: Diagnosis not present

## 2020-03-23 ENCOUNTER — Ambulatory Visit: Payer: Medicare PPO | Admitting: Internal Medicine

## 2020-03-23 ENCOUNTER — Encounter: Payer: Self-pay | Admitting: Internal Medicine

## 2020-03-23 ENCOUNTER — Other Ambulatory Visit: Payer: Self-pay

## 2020-03-23 VITALS — BP 172/80 | HR 51 | Ht 67.0 in | Wt 162.0 lb

## 2020-03-23 DIAGNOSIS — D6869 Other thrombophilia: Secondary | ICD-10-CM | POA: Diagnosis not present

## 2020-03-23 DIAGNOSIS — I480 Paroxysmal atrial fibrillation: Secondary | ICD-10-CM

## 2020-03-23 DIAGNOSIS — I10 Essential (primary) hypertension: Secondary | ICD-10-CM

## 2020-03-23 MED ORDER — LOSARTAN POTASSIUM 50 MG PO TABS: 50.0000 mg | ORAL_TABLET | Freq: Every day | ORAL | 3 refills | Status: DC

## 2020-03-23 NOTE — Patient Instructions (Addendum)
Medication Instructions:  Increase your Losartan 50 mg a day  Your physician recommends that you continue on your current medications as directed. Please refer to the Current Medication list given to you today.  Labwork: None ordered.  Testing/Procedures: None ordered.  Follow-Up:  Your physician wants you to follow-up in: 6 months with the Afib Clinic, they will contact you to schedule.  03/27/21 at 12:15 pm with Thompson Grayer, MD    You will receive a reminder letter in the mail two months in advance. If you don't receive a letter, please call our office to schedule the follow-up appointment.   Any Other Special Instructions Will Be Listed Below (If Applicable).  If you need a refill on your cardiac medications before your next appointment, please call your pharmacy.   Labs at PCP : [facility], [place]

## 2020-03-23 NOTE — Progress Notes (Signed)
PCP: Kristen Loader, FNP   Primary EP: Dr Bolivar Haw Melanie Lucas is a 70 y.o. female who presents today for routine electrophysiology followup.  Since last being seen in our clinic, the patient reports doing very well.  Today, she denies symptoms of palpitations, chest pain, shortness of breath,  lower extremity edema, dizziness, presyncope, or syncope.  Her husband died a year ago from [place].  She is still grieving.  The patient is otherwise without complaint today.   Past Medical History:  Diagnosis Date  . Anxiety   . Cancer (HCC)    Skin- leg   . Chronic acquired lymphedema    a. R>L;  b. 03/2012 Neg LE U/S for DVT. Legs since age 59  . Complication of anesthesia    woke up during colonoscopy, Lithrostrixpy, Biospy  . COPD (chronic obstructive pulmonary disease) ([place])   . Coronary artery disease   . CVD (cardiovascular disease)   . Deaf, right   . Diabetes mellitus    Type II  . Diabetic neuropathy ([place])   . Dysrhythmia    afib  . Elevated TSH    a. 10/2012 - inst to f/u PCP.  . Full dentures   . Hematuria    a. while on pradaxa,  she reports that she has seen Dr Margie Ege and had low risk cystoscopy  . History of kidney stones   . Hyperlipidemia   . Hypertension    patient denies  . Incontinence    prior to urinating  . Lacunar infarction ([place])    a. 02/2009 non-acute Lacunar infarct of the right thalamus noted on MRI of brain.  . Left carotid stenosis   . Lymphedema of extremity    had this problem, since she was a teenager. especially seen in R LE  . Meniere disease   . Myocardial infarction ([place])   . Obesity   . Osteoarthritis    cervical & lumbar region, knees, hands cramp also   . PAF with post-termination pauses    a. on dronedarone;  b. CHA2DS2VASc = 5 (HTN, DM, h/o lacunar infarct on MRI, Female) ->refused oral anticoagulation after h/o hematuria on pradaxa;  c. 02/2012 Echo: EF 55-60%, mildly dil LA. D. Recurrent PAF 10/2012 after only taking Multaq 1x/day - spont  conv to NSR, placed back on BID Multaq/eliquis;  e. 10/2014 Multaq d/c'd->tikosyn initiated.  . Shortness of breath dyspnea    with exertion  . Stroke [facility])    2 mini strokes  . Tobacco abuse   . Vision abnormalities    Past Surgical History:  Procedure Laterality Date  . ANTERIOR CERVICAL CORPECTOMY N/A 02/21/2016   Procedure: ANTERIOR CERVICAL CORPECTOMY ANS FUSION CERVICAL SIX , ANTERIOR PLATING CERVICAL FIVE-SEVEN;  Surgeon: Consuella Lose, MD;  Location: [facility];  Service: Neurosurgery;  Laterality: N/A;  . ANTERIOR FUSION CERVICAL SPINE  02/21/2016  . Biopsy of adrenal glands    . BREAST BIOPSY Bilateral    benign results, both breasts   . CARDIAC CATHETERIZATION  2014   x1 stent placed, done in [place]   . CARDIOVERSION N/A 11/11/2014   Procedure: CARDIOVERSION;  Surgeon: Larey Dresser, MD;  Location: [facility];  Service: Cardiovascular;  Laterality: N/A;  . CAROTID ENDARTERECTOMY Left 07/29/2017  . COLONOSCOPY W/ POLYPECTOMY    . CORONARY STENT PLACEMENT    . ENDARTERECTOMY Left 07/29/2017   Procedure: ENDARTERECTOMY CAROTID LEFT;  Surgeon: Rosetta Posner, MD;  Location: [facility];  Service: Vascular;  Laterality: Left;  . PATCH ANGIOPLASTY Left 07/29/2017   Procedure: PATCH ANGIOPLASTY USING HEMASHIELD GOLD VASCULAR PATCH;  Surgeon: Rosetta Posner, MD;  Location: [facility];  Service: Vascular;  Laterality: Left;  . TEE WITHOUT CARDIOVERSION N/A 11/11/2014   Procedure: TRANSESOPHAGEAL ECHOCARDIOGRAM (TEE);  Surgeon: Larey Dresser, MD;  Location: [facility];  Service: Cardiovascular;  Laterality: N/A;    ROS- all systems are reviewed and negatives except as per HPI above  Current Outpatient Medications  Medication Sig Dispense Refill  . albuterol (PROVENTIL HFA;VENTOLIN HFA) 108 (90 Base) MCG/ACT inhaler Inhale 2 puffs into the lungs every 6 (six) hours as needed for wheezing or shortness of breath.     . diazepam (VALIUM) 2 MG tablet as needed. Only for [facility]    .  diltiazem (TIAZAC) 120 MG 24 hr capsule TAKE 1 CAPSULE (120 MG TOTAL) BY MOUTH DAILY.    [name] dofetilide (TIKOSYN) 500 MCG capsule Take 1 capsule (500 mcg total) by mouth 2 (two) times daily. 60 capsule 6  . DULoxetine (CYMBALTA) 60 MG capsule Take 60 mg by mouth daily.     [name] ELIQUIS 5 MG TABS tablet TAKE 1 TABLET BY MOUTH TWICE A DAY 180 tablet 1  . Empagliflozin-metFORMIN HCl 12.06-998 MG TABS Take 1 tablet by mouth 2 (two) times daily.    . furosemide (LASIX) 20 MG tablet Take 20 mg by mouth as needed for edema.     [name] ibuprofen (ADVIL,MOTRIN) 800 MG tablet Take 800 mg by mouth 3 (three) times daily as needed for mild pain.     [name] linaclotide (LINZESS) 290 MCG CAPS capsule Take 290 mcg by mouth daily before breakfast.    . losartan (COZAAR) 25 MG tablet TAKE 1 TABLET BY MOUTH EVERY DAY 90 tablet 3  . mirabegron ER (MYRBETRIQ) 50 MG TB24 tablet Take 50 mg by mouth daily.    . Multiple Vitamin (MULTIVITAMIN) tablet Take 1 tablet by mouth at bedtime.     [name] Plecanatide (TRULANCE PO) Take 1 tablet by mouth daily.    . repaglinide (PRANDIN) 2 MG tablet     . rosuvastatin (CRESTOR) 5 MG tablet Take 5 mg by mouth at bedtime.      No current facility-administered medications for this visit.    Physical Exam: Vitals:   03/23/20 1146  BP: (!) 172/80  Pulse: (!) 51  SpO2: 98%  Weight: 162 lb (73.5 kg)  Height: 5\' 7"  (1.702 m)    GEN- The patient is well appearing, alert and oriented x 3 today.   Head- normocephalic, atraumatic Eyes-  Sclera clear, conjunctiva pink Ears- hearing intact Oropharynx- clear Lungs- Clear to ausculation bilaterally, normal work of breathing Heart- Regular rate and rhythm, no murmurs, rubs or gallops, PMI not laterally displaced GI- soft, NT, ND, + BS Extremities- no clubbing, cyanosis, or edema  Wt Readings from Last 3 Encounters:  03/23/20 162 lb (73.5 kg)  10/21/19 162 lb 9.6 oz (73.8 kg)  01/05/19 170 lb (77.1 kg)    EKG tracing ordered today is personally  reviewed and shows sinus rhythm, PR 122 msec, QRS 124 msec, incomplete RBBB, QTc 400 msec  Assessment and Plan:  1. Paroxysmal atrial fibrillation Doing very well with tikosyn We will need to follow her closely on this medicine to avoid toxicity  Bmet, mg are ordered today.  She wishes to have these by her PCP and have them forwarded to me chads2vasc score is 7.  She is on eliquis  2. HTN  Stable No change required today  3. CAD No ischemic symptoms  4. Tobacco Cessation advised  5. Overweight She has worked very hard on this and has made good progress  Risks, benefits and potential toxicities for medications prescribed and/or refilled reviewed with patient today.   AF clinic in 6 months I will see in a year  Thompson Grayer MD, [facility] 03/23/2020 12:14 PM

## 2020-03-24 DIAGNOSIS — M62838 Other muscle spasm: Secondary | ICD-10-CM | POA: Diagnosis not present

## 2020-03-24 DIAGNOSIS — R102 Pelvic and perineal pain: Secondary | ICD-10-CM | POA: Diagnosis not present

## 2020-03-24 DIAGNOSIS — R1032 Left lower quadrant pain: Secondary | ICD-10-CM | POA: Diagnosis not present

## 2020-03-24 DIAGNOSIS — M6281 Muscle weakness (generalized): Secondary | ICD-10-CM | POA: Diagnosis not present

## 2020-03-31 DIAGNOSIS — H35371 Puckering of macula, right eye: Secondary | ICD-10-CM | POA: Diagnosis not present

## 2020-03-31 DIAGNOSIS — H02832 Dermatochalasis of right lower eyelid: Secondary | ICD-10-CM | POA: Diagnosis not present

## 2020-03-31 DIAGNOSIS — E119 Type 2 diabetes mellitus without complications: Secondary | ICD-10-CM | POA: Diagnosis not present

## 2020-03-31 DIAGNOSIS — H52203 Unspecified astigmatism, bilateral: Secondary | ICD-10-CM | POA: Diagnosis not present

## 2020-04-11 ENCOUNTER — Ambulatory Visit: Payer: Medicare PPO | Admitting: Neurology

## 2020-04-11 ENCOUNTER — Encounter: Payer: Self-pay | Admitting: Neurology

## 2020-04-11 VITALS — BP 130/78 | HR 52 | Ht 67.0 in | Wt 162.6 lb

## 2020-04-11 DIAGNOSIS — R413 Other amnesia: Secondary | ICD-10-CM | POA: Diagnosis not present

## 2020-04-11 DIAGNOSIS — W19XXXA Unspecified fall, initial encounter: Secondary | ICD-10-CM

## 2020-04-11 DIAGNOSIS — I6990 Unspecified sequelae of unspecified cerebrovascular disease: Secondary | ICD-10-CM | POA: Diagnosis not present

## 2020-04-11 NOTE — Progress Notes (Signed)
[facility] [address] [place]. [place] [address] [phone]       OFFICE CONSULT NOTE  [name] Date of Birth:  [birth year] Medical Record Number:  [id number]   Referring MD: Jillyn Ledger FNP  Reason for Referral: Frequent falls and memory loss  HPI: Melanie Lucas is a 70 year old Caucasian lady with past medical history of chronic lymphedema in the legs, coronary artery disease, COPD, deafness in the right ear, diabetes, neuropathy, chronic atrial fibrillation, hypertension, hyperlipidemia, right thalamic infarct January 2011, left carotid stenosis s/p surgery, (TIA in May 2019, right corona radiata infarct November 2019, right periventricular infarct December 2019 with mild residual memory difficulties and imbalance.  She is referred back to me as she has had frequent falls in the last 3 to 6 months.  Patient's has had several falls while trying to get out of bed.  She is states that she has a new mattress and she sleeps off it.  She had 1 fall when she was painting her house which she had put up for sale.  After the fall she was confused and disoriented for short time and EMS had to be called.  She got better and did not go to see the doctor until several days later which prompted this referral.  Patient also noticed that her short-term memory seems to be getting worse.  She has trouble remembering recent information with she has taught herself to write things down and be organized and may remember later.  She denies any headaches, new stroke or TIA-like symptoms, seizures, loss of consciousness.  She remains on Eliquis for chronic atrial fibrillation is tolerating it well with only minor bruising and no bleeding.  She has had no recent stroke or TIA symptoms.  She remains independent in activities of daily living and manages own affairs.   She was last seen in the office for follow-up on 09/30/2018 and has not returned for follow-up after that. ROS:   14 system review of  systems is positive for memory loss, imbalance, falls, cognitive difficulties, bruising all other systems negative  PMH:  Past Medical History:  Diagnosis Date  . Anxiety   . Cancer (HCC)    Skin- leg   . Chronic acquired lymphedema    a. R>L;  b. 03/2012 Neg LE U/S for DVT. Legs since age 45  . Complication of anesthesia    woke up during colonoscopy, Lithrostrixpy, Biospy  . COPD (chronic obstructive pulmonary disease) ([place])   . Coronary artery disease   . CVD (cardiovascular disease)   . Deaf, right   . Diabetes mellitus    Type II  . Diabetic neuropathy ([place])   . Dysrhythmia    afib  . Elevated TSH    a. 10/2012 - inst to f/u PCP.  . Full dentures   . Hematuria    a. while on pradaxa,  she reports that she has seen Dr Margie Ege and had low risk cystoscopy  . History of kidney stones   . Hyperlipidemia   . Hypertension    patient denies  . Incontinence    prior to urinating  . Lacunar infarction ([place])    a. 02/2009 non-acute Lacunar infarct of the right thalamus noted on MRI of brain.  . Left carotid stenosis   . Lymphedema of extremity    had this problem, since she was a teenager. especially seen in R LE  . Meniere disease   . Myocardial infarction ([place])   . Obesity   .  Osteoarthritis    cervical & lumbar region, knees, hands cramp also   . PAF with post-termination pauses    a. on dronedarone;  b. CHA2DS2VASc = 5 (HTN, DM, h/o lacunar infarct on MRI, Female) ->refused oral anticoagulation after h/o hematuria on pradaxa;  c. 02/2012 Echo: EF 55-60%, mildly dil LA. D. Recurrent PAF 10/2012 after only taking Multaq 1x/day - spont conv to NSR, placed back on BID Multaq/eliquis;  e. 10/2014 Multaq d/c'd->tikosyn initiated.  . Shortness of breath dyspnea    with exertion  . Stroke [facility])    2 mini strokes  . Tobacco abuse   . Vision abnormalities     Social History:  Social History   Socioeconomic History  . Marital status: Married    Spouse name: Coralee Pesa  . Number of  children: 2  . Years of education: 52  . Highest education level: Master's degree (e.g., MA, MS, MEng, MEd, MSW, MBA)  Occupational History  . Occupation: retired    Comment: Pharmacist, hospital - art  Tobacco Use  . Smoking status: Current Every Day Smoker    Packs/day: 1.00    Years: 55.00    Pack years: 55.00    Types: Cigarettes  . Smokeless tobacco: Never Used  Vaping Use  . Vaping Use: Never used  Substance and Sexual Activity  . Alcohol use: Yes    Alcohol/week: 1.0 standard drink    Types: 1 Glasses of wine per week  . Drug use: No  . Sexual activity: Yes  Other Topics Concern  . Not on file  Social History Narrative   Lives with daughter and grandson   Right handed   Drinks 3-5 cups caffeine daily   She operates an entertainment business   Social Determinants of Radio broadcast assistant Strain: Not on file  Food Insecurity: Not on file  Transportation Needs: Not on file  Physical Activity: Not on file  Stress: Not on file  Social Connections: Not on file  Intimate Partner Violence: Not on file    Medications:   Current Outpatient Medications on File Prior to Visit  Medication Sig Dispense Refill  . albuterol (PROVENTIL HFA;VENTOLIN HFA) 108 (90 Base) MCG/ACT inhaler Inhale 2 puffs into the lungs every 6 (six) hours as needed for wheezing or shortness of breath.     . diazepam (VALIUM) 2 MG tablet as needed. Only for [facility]    . dofetilide (TIKOSYN) 500 MCG capsule Take 1 capsule (500 mcg total) by mouth 2 (two) times daily. 60 capsule 6  . DULoxetine (CYMBALTA) 60 MG capsule Take 60 mg by mouth daily.     [name] ELIQUIS 5 MG TABS tablet TAKE 1 TABLET BY MOUTH TWICE A DAY 180 tablet 1  . furosemide (LASIX) 20 MG tablet Take 20 mg by mouth as needed for edema.     [name] ibuprofen (ADVIL,MOTRIN) 800 MG tablet Take 800 mg by mouth 3 (three) times daily as needed for mild pain.     [name] linaclotide (LINZESS) 290 MCG CAPS capsule Take 290 mcg by mouth daily before breakfast.    .  losartan (COZAAR) 50 MG tablet Take 1 tablet (50 mg total) by mouth daily. 90 tablet 3  . mirabegron ER (MYRBETRIQ) 50 MG TB24 tablet Take 50 mg by mouth daily.    . Multiple Vitamin (MULTIVITAMIN) tablet Take 1 tablet by mouth at bedtime.     . repaglinide (PRANDIN) 2 MG tablet     . rosuvastatin (CRESTOR) 5 MG tablet Take  5 mg by mouth at bedtime.     [name] diltiazem (TIAZAC) 120 MG 24 hr capsule TAKE 1 CAPSULE (120 MG TOTAL) BY MOUTH DAILY.    . Empagliflozin-metFORMIN HCl 12.06-998 MG TABS Take 1 tablet by mouth 2 (two) times daily.    [name] Plecanatide (TRULANCE PO) Take 1 tablet by mouth daily.     No current facility-administered medications on file prior to visit.    Allergies:   Allergies  Allergen Reactions  . Avelox [Moxifloxacin]     Unknown reaction   . Chantix [Varenicline]     nightmares  . Fosamax [Alendronate]     Pain all over  . Pradaxa [Dabigatran Etexilate Mesylate]     Extreme bleeding  . Statins Nausea And Vomiting and Other (See Comments)    Muscle pain, Dizziness (intolerance)  . Sulfamethoxazole Nausea And Vomiting    Dizziness (intolerance)  . Sulfonamide Derivatives Hives    Nausea vertigo  . Ciprofloxacin Rash  . Gabapentin Rash    burning  . Tape Itching and Rash    Please use "paper" tape only    Physical Exam General: well developed, well nourished elderly Caucasian lady, seated, in no evident distress Head: head normocephalic and atraumatic.   Neck: supple with no carotid or supraclavicular bruits Cardiovascular: regular rate and rhythm, no murmurs Musculoskeletal: no deformity Skin:  no rash/petichiae.  Bilateral lymphedema lower extremities. Vascular:  Normal pulses all extremities  Neurologic Exam Mental Status: Awake and fully alert. Oriented to place and time. Recent and remote memory intact. Attention span, concentration and fund of knowledge appropriate. Mood and affect appropriate.  Diminished recall 2/3.  Mini-Mental status exam score  29/30.  Able to name 11 animals which can walk on 4 legs.  Clock drawing 4/4.  Geriatric depression scale scored 5 not suggestive of depression. Cranial Nerves: Fundoscopic exam reveals sharp disc margins. Pupils equal, briskly reactive to light. Extraocular movements full without nystagmus. Visual fields full to confrontation. Hearing diminished bilaterally. Facial sensation intact. Face, tongue, palate moves normally and symmetrically.  Motor: Normal bulk and tone. Normal strength in all tested extremity muscles. Sensory.: intact to touch , pinprick , but impaired position and vibratory sensation from ankle down bilaterally.  Positive Romberg's test..  Coordination: Rapid alternating movements normal in all extremities. Finger-to-nose and heel-to-shin performed accurately bilaterally. Gait and Station: Arises from chair without difficulty. Stance is normal. Gait demonstrates normal stride length and balance .  Poor truncal balance and would fall if not caught to postural threat.  Not able to heel, toe and tandem walk without difficulty.  Reflexes: 1+ and symmetric except ankle jerks are depressed bilaterally.. Toes downgoing.   NIHSS 1 Modified Rankin 2   ASSESSMENT: 70 year old Caucasian lady with frequent falls likely multifactorial due to combination of prior strokes, diabetic neuropathy and small vessel disease and memory loss which likely is mild cognitive impairment and prior history of multiple lacunar strokes and TIA.  Vascular risk factors of atrial fibrillation, diabetes, hypertension, hyperlipidemia, age and cerebrovascular disease     PLAN: I had a long discussion with the patient and her daughter regarding her frequent falls and memory loss and prior history of strokes and answered questions.  I advised her to get up slowly and avoid sudden movements and we discussed fall prevention precautions.  Check lab work for reversible causes of memory loss, hemoglobin A1c, lipid profile and  MRI scan of the brain as well as EEG.  We also discussed memory compensation strategies.  Continue Eliquis for stroke prevention given history of atrial fibrillation and maintain aggressive risk factor modification with strict control of hypertension with blood pressure goal below 130/90, lipids with LDL cholesterol goal below 70 mg percent and diabetes with hemoglobin A1c goal below 6.5%.  She will return for follow-up in future in 3 months or call earlier if necessary.  Greater than 50% time during this prolonged 60-minute consultation visit was spent on counseling and coordination of care about her frequent falls and memory difficulties and prior strokes and answering questions. Antony Contras, MD  [facility] [address] [place], [place] [address]  Phone [phone] Fax [phone] Note: This document was prepared with digital dictation and possible smart phrase technology. Any transcriptional errors that result from this process are unintentional.

## 2020-04-11 NOTE — Patient Instructions (Signed)
I had a long discussion with the patient and her daughter regarding her frequent falls and memory loss and prior history of strokes and answered questions.  I advised her to get up slowly and avoid sudden movements and we discussed fall prevention precautions.  Check lab work for reversible causes of memory loss, hemoglobin A1c, lipid profile and MRI scan of the brain as well as EEG.  We also discussed memory compensation strategies.  Continue Eliquis for stroke prevention given history of atrial fibrillation and maintain aggressive risk factor modification with strict control of hypertension with blood pressure goal below 130/90, lipids with LDL cholesterol goal below 70 mg percent and diabetes with hemoglobin A1c goal below 6.5%.  She will return for follow-up in future in 3 months or call earlier if necessary.  Fall Prevention in the Home, Adult Falls can cause injuries and can happen to people of all ages. There are many things you can do to make your home safe and to help prevent falls. Ask for help when making these changes. What actions can I take to prevent falls? General Instructions  Use good lighting in all rooms. Replace any light bulbs that burn out.  Turn on the lights in dark areas. Use night-lights.  Keep items that you use often in easy-to-reach places. Lower the shelves around your home if needed.  Set up your furniture so you have a clear path. Avoid moving your furniture around.  Do not have throw rugs or other things on the floor that can make you trip.  Avoid walking on wet floors.  If any of your floors are uneven, fix them.  Add color or contrast paint or tape to clearly mark and help you see: ? Grab bars or handrails. ? First and last steps of staircases. ? Where the edge of each step is.  If you use a stepladder: ? Make sure that it is fully opened. Do not climb a closed stepladder. ? Make sure the sides of the stepladder are locked in place. ? Ask someone to  hold the stepladder while you use it.  Know where your pets are when moving through your home. What can I do in the bathroom?  Keep the floor dry. Clean up any water on the floor right away.  Remove soap buildup in the tub or shower.  Use nonskid mats or decals on the floor of the tub or shower.  Attach bath mats securely with double-sided, nonslip rug tape.  If you need to sit down in the shower, use a plastic, nonslip stool.  Install grab bars by the toilet and in the tub and shower. Do not use towel bars as grab bars.      What can I do in the bedroom?  Make sure that you have a light by your bed that is easy to reach.  Do not use any sheets or blankets for your bed that hang to the floor.  Have a firm chair with side arms that you can use for support when you get dressed. What can I do in the kitchen?  Clean up any spills right away.  If you need to reach something above you, use a step stool with a grab bar.  Keep electrical cords out of the way.  Do not use floor polish or wax that makes floors slippery. What can I do with my stairs?  Do not leave any items on the stairs.  Make sure that you have a light switch at the  top and the bottom of the stairs.  Make sure that there are handrails on both sides of the stairs. Fix handrails that are broken or loose.  Install nonslip stair treads on all your stairs.  Avoid having throw rugs at the top or bottom of the stairs.  Choose a carpet that does not hide the edge of the steps on the stairs.  Check carpeting to make sure that it is firmly attached to the stairs. Fix carpet that is loose or worn. What can I do on the outside of my home?  Use bright outdoor lighting.  Fix the edges of walkways and driveways and fix any cracks.  Remove anything that might make you trip as you walk through a door, such as a raised step or threshold.  Trim any bushes or trees on paths to your home.  Check to see if handrails are  loose or broken and that both sides of all steps have handrails.  Install guardrails along the edges of any raised decks and porches.  Clear paths of anything that can make you trip, such as tools or rocks.  Have leaves, snow, or ice cleared regularly.  Use sand or salt on paths during winter.  Clean up any spills in your garage right away. This includes grease or oil spills. What other actions can I take?  Wear shoes that: ? Have a low heel. Do not wear high heels. ? Have rubber bottoms. ? Feel good on your feet and fit well. ? Are closed at the toe. Do not wear open-toe sandals.  Use tools that help you move around if needed. These include: ? Canes. ? Walkers. ? Scooters. ? Crutches.  Review your medicines with your doctor. Some medicines can make you feel dizzy. This can increase your chance of falling. Ask your doctor what else you can do to help prevent falls. Where to find more information  Centers for Disease Control and Prevention, STEADI: http://www.wolf.info/  National Institute on Aging: http://kim-miller.com/ Contact a doctor if:  You are afraid of falling at home.  You feel weak, drowsy, or dizzy at home.  You fall at home. Summary  There are many simple things that you can do to make your home safe and to help prevent falls.  Ways to make your home safe include removing things that can make you trip and installing grab bars in the bathroom.  Ask for help when making these changes in your home. This information is not intended to replace advice given to you by your health care provider. Make sure you discuss any questions you have with your health care provider. Document Revised: 09/09/2019 Document Reviewed: 09/09/2019 Elsevier Patient Education  [place].  Memory Compensation Strategies  7. Use "WARM" strategy.  W= write it down  A= associate it  R= repeat it  M= make a mental note  2.   You can keep a Social worker.  Use a 3-ring notebook with  sections for the following: calendar, important names and phone numbers,  medications, doctors' names/phone numbers, lists/reminders, and a section to journal what you did  each day.   3.    Use a calendar to write appointments down.  4.    Write yourself a schedule for the day.  This can be placed on the calendar or in a separate section of the Memory Notebook.  Keeping a  regular schedule can help memory.  5.    Use medication organizer with sections for each day  or morning/evening pills.  You may need help loading it  6.    Keep a basket, or pegboard by the door.  Place items that you need to take out with you in the basket or on the pegboard.  You may also want to  include a message board for reminders.  7.    Use sticky notes.  Place sticky notes with reminders in a place where the task is performed.  For example: " turn off the  stove" placed by the stove, "lock the door" placed on the door at eye level, " take your medications" on  the bathroom mirror or by the place where you normally take your medications.  8.    Use alarms/timers.  Use while cooking to remind yourself to check on food or as a reminder to take your medicine, or as a  reminder to make a call, or as a reminder to perform another task, etc.

## 2020-04-12 ENCOUNTER — Telehealth: Payer: Self-pay | Admitting: Neurology

## 2020-04-12 LAB — LIPID PANEL
Chol/HDL Ratio: 2.2 ratio (ref 0.0–4.4)
Cholesterol, Total: 151 mg/dL (ref 100–199)
HDL: 69 mg/dL (ref >39)
LDL Chol Calc (NIH): 62 mg/dL (ref 0–99)
Triglycerides: 111 mg/dL (ref 0–149)
VLDL Cholesterol Cal: 20 mg/dL (ref 5–40)

## 2020-04-12 LAB — DEMENTIA PANEL
Homocysteine: 9 umol/L (ref 0.0–19.2)
RPR Ser Ql: NONREACTIVE
TSH: 3.04 u[IU]/mL (ref 0.450–4.500)
Vitamin B-12: 597 pg/mL (ref 232–1245)

## 2020-04-12 LAB — HEMOGLOBIN A1C
Est. average glucose Bld gHb Est-mCnc: 189 mg/dL
Hgb A1c MFr Bld: 8.2 % — ABNORMAL HIGH (ref 4.8–5.6)

## 2020-04-12 NOTE — Telephone Encounter (Signed)
Melanie Lucas: [id number] (exp. 04/12/20 to 05/12/20) order sent to GI. They will reach out to the patient to schedule.

## 2020-04-14 ENCOUNTER — Institutional Professional Consult (permissible substitution): Payer: Medicare PPO | Admitting: Neurology

## 2020-04-19 NOTE — Progress Notes (Signed)
Kindly inform the patient that cholesterol profile and lab work for reversible causes of memory loss were both normal however her screening test for diabetes was not satisfactory and she needs to see primary care physician for tighter blood sugar control

## 2020-04-21 ENCOUNTER — Encounter: Payer: Self-pay | Admitting: *Deleted

## 2020-04-26 ENCOUNTER — Ambulatory Visit
Admission: RE | Admit: 2020-04-26 | Discharge: 2020-04-26 | Disposition: A | Discharge status: Home / Self Care | Payer: Medicare PPO | Source: Ambulatory Visit | Attending: Neurology | Admitting: Neurology

## 2020-04-26 ENCOUNTER — Other Ambulatory Visit: Payer: Self-pay

## 2020-04-26 DIAGNOSIS — R413 Other amnesia: Secondary | ICD-10-CM | POA: Diagnosis not present

## 2020-04-26 MED ORDER — GADOBENATE DIMEGLUMINE 529 MG/ML IV SOLN
15.0000 mL | Freq: Once | INTRAVENOUS | Status: AC | PRN
  Administered 2020-04-26: 15 mL via INTRAVENOUS

## 2020-04-27 ENCOUNTER — Ambulatory Visit (HOSPITAL_COMMUNITY)
Admission: RE | Admit: 2020-04-27 | Discharge: 2020-04-27 | Disposition: A | Discharge status: Home / Self Care | Payer: Medicare PPO | Source: Ambulatory Visit | Attending: Nurse Practitioner | Admitting: Nurse Practitioner

## 2020-04-27 ENCOUNTER — Other Ambulatory Visit (HOSPITAL_COMMUNITY): Payer: Self-pay | Admitting: *Deleted

## 2020-04-27 ENCOUNTER — Encounter (HOSPITAL_COMMUNITY): Payer: Self-pay | Admitting: Nurse Practitioner

## 2020-04-27 ENCOUNTER — Other Ambulatory Visit: Payer: Self-pay

## 2020-04-27 VITALS — BP 132/58 | HR 53 | Ht 67.0 in | Wt 167.0 lb

## 2020-04-27 DIAGNOSIS — I2510 Atherosclerotic heart disease of native coronary artery without angina pectoris: Secondary | ICD-10-CM | POA: Insufficient documentation

## 2020-04-27 DIAGNOSIS — I480 Paroxysmal atrial fibrillation: Secondary | ICD-10-CM | POA: Diagnosis not present

## 2020-04-27 DIAGNOSIS — Z7901 Long term (current) use of anticoagulants: Secondary | ICD-10-CM | POA: Diagnosis not present

## 2020-04-27 DIAGNOSIS — Z79899 Other long term (current) drug therapy: Secondary | ICD-10-CM | POA: Diagnosis not present

## 2020-04-27 DIAGNOSIS — Z8673 Personal history of transient ischemic attack (TIA), and cerebral infarction without residual deficits: Secondary | ICD-10-CM | POA: Insufficient documentation

## 2020-04-27 DIAGNOSIS — F17210 Nicotine dependence, cigarettes, uncomplicated: Secondary | ICD-10-CM | POA: Diagnosis not present

## 2020-04-27 DIAGNOSIS — D6869 Other thrombophilia: Secondary | ICD-10-CM

## 2020-04-27 DIAGNOSIS — Z7984 Long term (current) use of oral hypoglycemic drugs: Secondary | ICD-10-CM | POA: Insufficient documentation

## 2020-04-27 DIAGNOSIS — E119 Type 2 diabetes mellitus without complications: Secondary | ICD-10-CM | POA: Diagnosis not present

## 2020-04-27 DIAGNOSIS — I10 Essential (primary) hypertension: Secondary | ICD-10-CM | POA: Diagnosis not present

## 2020-04-27 LAB — BASIC METABOLIC PANEL
Anion gap: 7 (ref 5–15)
BUN: 8 mg/dL (ref 8–23)
CO2: 29 mmol/L (ref 22–32)
Calcium: 9 mg/dL (ref 8.9–10.3)
Chloride: 101 mmol/L (ref 98–111)
Creatinine, Ser: 0.67 mg/dL (ref 0.44–1.00)
GFR, Estimated: >60 mL/min (ref >60)
Glucose, Bld: 235 mg/dL — ABNORMAL HIGH (ref 70–99)
Potassium: 3.6 mmol/L (ref 3.5–5.1)
Sodium: 137 mmol/L (ref 135–145)

## 2020-04-27 LAB — MAGNESIUM: Magnesium: 2 mg/dL (ref 1.7–2.4)

## 2020-04-27 MED ORDER — POTASSIUM CHLORIDE CRYS ER 20 MEQ PO TBCR: 20.0000 meq | EXTENDED_RELEASE_TABLET | Freq: Every day | ORAL | 3 refills | Status: DC

## 2020-04-27 NOTE — Progress Notes (Signed)
Patient ID: Melanie Lucas, female   DOB: 11-02-1950, 70 y.o.   MRN: [id number]     Primary Care Physician: Kristen Loader, FNP Referring Physician: [facility] f/u   Melanie Lucas is a 70 y.o. female with a h/o PAF previously managed with dronedarone, lacunar infarct, HTN, DM, CAD, and tobacco abuse. She was  Seen 11/09/15 and was noted to be back in atrial fibrillation with RVR. Decision was made to admit her for further evaluation and eliquis initiation followed by tikosyn loading.  Following admission, pt was placed on IV diltitazem and dronedarone was discontinued. Eliquis was started with a plan to perfom TEE and cardioversion after she had received 5 doses of eliquis. Rates were relatively stable and troponins were normal. She underwent TEE on 9/23 revealing no evidence of LAA thrombus. This was followed by DCCV using one synchronized shock at 200 joules with successful conversion to sinus bradycardia with intermittent junctional rhythm. Following cardioversion, she was loaded with Tikosyn 500 mcg q12 hrs. She  tolerated tikosyn loading well and has not had any further atrial fibrillation. Her QTc  remained relatively stable.  F/u in afib clinic, 01/05/19, after seeing Dr. Rayann Heman virtually  in September.  I have not seen pt since 2017. She states that she has not noted any recent afib. Remains on dofetilide 500 mcg bid and eliquis 5 mg bid for chadsvasc score of at least  7.   F/u in afib clinic, 10/21/19. She remains on Tikosyn and is staying in [place]. She continues to smoke. She is still grieving the death of her husband from [place]  last February. She is in SR today with acceptable qt.   F/u in afib clinic, 05/07/20. She reports that she has been doing well without any heart rate irregularity. No  issues to report. She is being compliant with Tikosyn and eliquis 5 mg bid.   Today, she denies symptoms of palpitations, chest pain, shortness of breath, orthopnea, PND, lower extremity edema, dizziness,  presyncope, syncope, or neurologic sequela. The patient is tolerating medications without difficulties and is otherwise without complaint today.   Past Medical History:  Diagnosis Date  . Anxiety   . Cancer (HCC)    Skin- leg   . Chronic acquired lymphedema    a. R>L;  b. 03/2012 Neg LE U/S for DVT. Legs since age 40  . Complication of anesthesia    woke up during colonoscopy, Lithrostrixpy, Biospy  . COPD (chronic obstructive pulmonary disease) ([place])   . Coronary artery disease   . CVD (cardiovascular disease)   . Deaf, right   . Diabetes mellitus    Type II  . Diabetic neuropathy ([place])   . Dysrhythmia    afib  . Elevated TSH    a. 10/2012 - inst to f/u PCP.  . Full dentures   . Hematuria    a. while on pradaxa,  she reports that she has seen Dr Margie Ege and had low risk cystoscopy  . History of kidney stones   . Hyperlipidemia   . Hypertension    patient denies  . Incontinence    prior to urinating  . Lacunar infarction ([place])    a. 02/2009 non-acute Lacunar infarct of the right thalamus noted on MRI of brain.  . Left carotid stenosis   . Lymphedema of extremity    had this problem, since she was a teenager. especially seen in R LE  . Meniere disease   . Myocardial infarction ([place])   . Obesity   .  Osteoarthritis    cervical & lumbar region, knees, hands cramp also   . PAF with post-termination pauses    a. on dronedarone;  b. CHA2DS2VASc = 5 (HTN, DM, h/o lacunar infarct on MRI, Female) ->refused oral anticoagulation after h/o hematuria on pradaxa;  c. 02/2012 Echo: EF 55-60%, mildly dil LA. D. Recurrent PAF 10/2012 after only taking Multaq 1x/day - spont conv to NSR, placed back on BID Multaq/eliquis;  e. 10/2014 Multaq d/c'd->tikosyn initiated.  . Shortness of breath dyspnea    with exertion  . Stroke [facility])    2 mini strokes  . Tobacco abuse   . Vision abnormalities    Past Surgical History:  Procedure Laterality Date  . ANTERIOR CERVICAL CORPECTOMY N/A 02/21/2016    Procedure: ANTERIOR CERVICAL CORPECTOMY ANS FUSION CERVICAL SIX , ANTERIOR PLATING CERVICAL FIVE-SEVEN;  Surgeon: Consuella Lose, MD;  Location: [facility];  Service: Neurosurgery;  Laterality: N/A;  . ANTERIOR FUSION CERVICAL SPINE  02/21/2016  . Biopsy of adrenal glands    . BREAST BIOPSY Bilateral    benign results, both breasts   . CARDIAC CATHETERIZATION  2014   x1 stent placed, done in [place]   . CARDIOVERSION N/A 11/11/2014   Procedure: CARDIOVERSION;  Surgeon: Larey Dresser, MD;  Location: [facility];  Service: Cardiovascular;  Laterality: N/A;  . CAROTID ENDARTERECTOMY Left 07/29/2017  . COLONOSCOPY W/ POLYPECTOMY    . CORONARY STENT PLACEMENT    . ENDARTERECTOMY Left 07/29/2017   Procedure: ENDARTERECTOMY CAROTID LEFT;  Surgeon: Rosetta Posner, MD;  Location: [facility];  Service: Vascular;  Laterality: Left;  . PATCH ANGIOPLASTY Left 07/29/2017   Procedure: PATCH ANGIOPLASTY USING HEMASHIELD GOLD VASCULAR PATCH;  Surgeon: Rosetta Posner, MD;  Location: [facility];  Service: Vascular;  Laterality: Left;  . TEE WITHOUT CARDIOVERSION N/A 11/11/2014   Procedure: TRANSESOPHAGEAL ECHOCARDIOGRAM (TEE);  Surgeon: Larey Dresser, MD;  [facility];  Service: Cardiovascular;  Laterality: N/A;    Current Outpatient Medications  Medication Sig Dispense Refill  . diazepam (VALIUM) 2 MG tablet as needed. Only for [place]    . diltiazem (TIAZAC) 120 MG 24 hr capsule TAKE 1 CAPSULE (120 MG TOTAL) BY MOUTH DAILY.    [name] dofetilide (TIKOSYN) 500 MCG capsule Take 1 capsule (500 mcg total) by mouth 2 (two) times daily. 60 capsule 6  . DULoxetine (CYMBALTA) 60 MG capsule Take 60 mg by mouth daily.     [name] ELIQUIS 5 MG TABS tablet TAKE 1 TABLET BY MOUTH TWICE A DAY 180 tablet 1  . Empagliflozin-metFORMIN HCl 12.06-998 MG TABS Take 1 tablet by mouth 2 (two) times daily.    . furosemide (LASIX) 20 MG tablet Take 20 mg by mouth as needed for edema.     [name] ibuprofen (ADVIL,MOTRIN) 800 MG tablet Take 800  mg by mouth 3 (three) times daily as needed for mild pain.     [name] linaclotide (LINZESS) 290 MCG CAPS capsule Take 290 mcg by mouth daily before breakfast.    . losartan (COZAAR) 50 MG tablet Take 1 tablet (50 mg total) by mouth daily. 90 tablet 3  . mirabegron ER (MYRBETRIQ) 50 MG TB24 tablet Take 50 mg by mouth daily.    . Multiple Vitamin (MULTIVITAMIN) tablet Take 1 tablet by mouth at bedtime.     [name] Plecanatide (TRULANCE PO) Take 1 tablet by mouth daily.    . repaglinide (PRANDIN) 2 MG tablet     . rosuvastatin (CRESTOR) 5 MG tablet Take  5 mg by mouth at bedtime.      No current facility-administered medications for this encounter.    Allergies  Allergen Reactions  . Avelox [Moxifloxacin]     Unknown reaction   . Chantix [Varenicline]     nightmares  . Fosamax [Alendronate]     Pain all over  . Pradaxa [Dabigatran Etexilate Mesylate]     Extreme bleeding  . Statins Nausea And Vomiting and Other (See Comments)    Muscle pain, Dizziness (intolerance)  . Sulfamethoxazole Nausea And Vomiting    Dizziness (intolerance)  . Sulfonamide Derivatives Hives    Nausea vertigo  . Ciprofloxacin Rash  . Gabapentin Rash    burning  . Tape Itching and Rash    Please use "paper" tape only    Social History   Socioeconomic History  . Marital status: Married    Spouse name: Coralee Pesa  . Number of children: 2  . Years of education: 11  . Highest education level: Master's degree (e.g., MA, MS, MEng, MEd, MSW, MBA)  Occupational History  . Occupation: retired    Comment: Pharmacist, hospital - art  Tobacco Use  . Smoking status: Current Every Day Smoker    Packs/day: 1.00    Years: 55.00    Pack years: 55.00    Types: Cigarettes  . Smokeless tobacco: Never Used  . Tobacco comment: 1 pack daily  Vaping Use  . Vaping Use: Never used  Substance and Sexual Activity  . Alcohol use: Yes    Alcohol/week: 1.0 standard drink    Types: 1 Glasses of wine per week  . Drug use: No  . Sexual activity: Yes   Other Topics Concern  . Not on file  Social History Narrative   Lives with daughter and grandson   Right handed   Drinks 3-5 cups caffeine daily   She operates an entertainment business   Social Determinants of Radio broadcast assistant Strain: Not on file  Food Insecurity: Not on file  Transportation Needs: Not on file  Physical Activity: Not on file  Stress: Not on file  Social Connections: Not on file  Intimate Partner Violence: Not on file    Family History  Problem Relation Age of Onset  . Heart attack Father   . Coronary artery disease Father        strong family hx  . Bladder Cancer Mother        bladder  . Leukemia Brother   . Breast cancer Maternal Aunt   . Breast cancer Paternal Aunt   . Breast cancer Maternal Aunt     ROS- All systems are reviewed and negative except as per the HPI above  Physical Exam: Vitals:   04/27/20 1407  BP: (!) 132/58  Pulse: (!) 53  Weight: 75.8 kg  Height: 5\' 7"  (1.702 m)    GEN- The patient is well appearing, alert and oriented x 3 today.   Head- normocephalic, atraumatic Eyes-  Sclera clear, conjunctiva pink Ears- hearing intact Oropharynx- clear Neck- supple, no JVP Lymph- no cervical lymphadenopathy Lungs- Clear to ausculation bilaterally, normal work of breathing Heart- Regular rate and rhythm, no murmurs, rubs or gallops, PMI not laterally displaced GI- soft, NT, ND, + BS Extremities- no clubbing, cyanosis, or edema MS- no significant deformity or atrophy Skin- no rash or lesion Psych- euthymic mood, full affect Neuro- strength and sensation are intact  EKG- Sinus brady at 53 bpm, pr int 124 ms, qrs 122  ms, qtc 435  ms  Epic records reviewed  Echo-- Left ventricle: The cavity size was normal. Systolic function was normal. The estimated ejection fraction was in the range of 50% to 55%. Wall motion was normal; there were no regional wall motion abnormalities. The study was not technically  sufficient to allow evaluation of LV diastolic dysfunction due to atrial fibrillation. - Aortic valve: Trileaflet; normal thickness leaflets. There was no regurgitation. - Aortic root: The aortic root was normal in size. - Ascending aorta: The ascending aorta was normal in size. - Mitral valve: Structurally normal valve. There was mild regurgitation. - Left atrium: The atrium was normal in size. - Right ventricle: The cavity size was normal. Wall thickness was normal. Systolic function was normal. - Tricuspid valve: There was mild-moderate regurgitation. - Pulmonary arteries: Systolic pressure was mildly increased. PA peak pressure: 37 mm Hg (S). - Inferior vena cava: The vessel was normal in size. - Pericardium, extracardiac: There was no pericardial effusion.   Assessment and Plan: 1. Afib In SR on tikosyn 500 mcg bid qtc is stable  Continue eliquis,  chadsvasc score at least 7 Bmet/mag today   2. Tobacco abuse Encouraged cessation  3. HTN  Stable  Avoid salt    4. CAD  No  anginal symptoms   F/u in afib clinic in 6 months   Butch Penny C. Mikka Kissner, [facility] [address] [place], [place] [address] [phone]

## 2020-04-28 NOTE — Progress Notes (Signed)
Kindly inform the patient her MRI scan of the brain shows changes of shrinkage of the brain and hardening of the arteries which are age-related and expectedly slightly worse compared to previous MRI from 2011.  Nothing to worry about

## 2020-05-03 ENCOUNTER — Ambulatory Visit: Payer: Medicare PPO | Admitting: Neurology

## 2020-05-03 ENCOUNTER — Telehealth: Payer: Self-pay

## 2020-05-03 DIAGNOSIS — R413 Other amnesia: Secondary | ICD-10-CM

## 2020-05-03 DIAGNOSIS — R410 Disorientation, unspecified: Secondary | ICD-10-CM

## 2020-05-03 NOTE — Telephone Encounter (Signed)
I called patient.  I discussed her MRI brain results.  I reminded her of her EEG appointment today.  Patient verbalized understanding of results.  Patient had no further questions or concerns.

## 2020-05-03 NOTE — Telephone Encounter (Signed)
-----   Message from Garvin Fila, MD sent at 04/28/2020  4:59 PM EST ----- Kindly inform the patient her MRI scan of the brain shows changes of shrinkage of the brain and hardening of the arteries which are age-related and expectedly slightly worse compared to previous MRI from 2011.  Nothing to worry about

## 2020-05-09 ENCOUNTER — Institutional Professional Consult (permissible substitution): Payer: Medicare PPO | Admitting: Neurology

## 2020-05-10 ENCOUNTER — Other Ambulatory Visit: Payer: Self-pay

## 2020-05-10 ENCOUNTER — Ambulatory Visit (HOSPITAL_COMMUNITY)
Admission: RE | Admit: 2020-05-10 | Discharge: 2020-05-10 | Disposition: A | Discharge status: Home / Self Care | Payer: Medicare PPO | Source: Ambulatory Visit | Attending: Physician Assistant | Admitting: Physician Assistant

## 2020-05-10 DIAGNOSIS — I480 Paroxysmal atrial fibrillation: Secondary | ICD-10-CM | POA: Insufficient documentation

## 2020-05-10 LAB — BASIC METABOLIC PANEL
Anion gap: 7 (ref 5–15)
BUN: 10 mg/dL (ref 8–23)
CO2: 28 mmol/L (ref 22–32)
Calcium: 9.1 mg/dL (ref 8.9–10.3)
Chloride: 103 mmol/L (ref 98–111)
Creatinine, Ser: 0.7 mg/dL (ref 0.44–1.00)
GFR, Estimated: >60 mL/min (ref >60)
Glucose, Bld: 168 mg/dL — ABNORMAL HIGH (ref 70–99)
Potassium: 3.6 mmol/L (ref 3.5–5.1)
Sodium: 138 mmol/L (ref 135–145)

## 2020-05-13 NOTE — Progress Notes (Signed)
Kindly inform the patient that EEG study was normal

## 2020-05-17 ENCOUNTER — Telehealth: Payer: Self-pay | Admitting: Neurology

## 2020-05-17 NOTE — Telephone Encounter (Signed)
Called the patient's daughter and advised the EEG results were normal.  Patient's daughter verbalized understanding and had no further questions at this time. She was appreciative for the call.

## 2020-05-17 NOTE — Telephone Encounter (Signed)
-----   Message from Darleen Crocker, RN sent at 05/16/2020  5:09 PM EDT -----  ----- Message ----- From: Garvin Fila, MD Sent: 05/13/2020   3:15 PM EDT To: Baldomero Lamy, RN  Kindly inform the patient that EEG study was normal

## 2020-05-25 ENCOUNTER — Encounter (HOSPITAL_COMMUNITY): Payer: Self-pay

## 2020-05-25 ENCOUNTER — Emergency Department (HOSPITAL_COMMUNITY)
Admission: EM | Admit: 2020-05-25 | Discharge: 2020-05-25 | Disposition: A | Discharge status: Home / Self Care | Payer: Medicare PPO | Attending: Emergency Medicine | Admitting: Emergency Medicine

## 2020-05-25 ENCOUNTER — Other Ambulatory Visit (HOSPITAL_COMMUNITY): Payer: Medicare PPO | Admitting: Nurse Practitioner

## 2020-05-25 ENCOUNTER — Emergency Department (HOSPITAL_COMMUNITY): Payer: Medicare PPO

## 2020-05-25 DIAGNOSIS — Z955 Presence of coronary angioplasty implant and graft: Secondary | ICD-10-CM | POA: Diagnosis not present

## 2020-05-25 DIAGNOSIS — Z79899 Other long term (current) drug therapy: Secondary | ICD-10-CM | POA: Insufficient documentation

## 2020-05-25 DIAGNOSIS — M79604 Pain in right leg: Secondary | ICD-10-CM | POA: Diagnosis not present

## 2020-05-25 DIAGNOSIS — R001 Bradycardia, unspecified: Secondary | ICD-10-CM | POA: Diagnosis not present

## 2020-05-25 DIAGNOSIS — E1140 Type 2 diabetes mellitus with diabetic neuropathy, unspecified: Secondary | ICD-10-CM | POA: Diagnosis not present

## 2020-05-25 DIAGNOSIS — Z7901 Long term (current) use of anticoagulants: Secondary | ICD-10-CM | POA: Insufficient documentation

## 2020-05-25 DIAGNOSIS — J441 Chronic obstructive pulmonary disease with (acute) exacerbation: Secondary | ICD-10-CM | POA: Insufficient documentation

## 2020-05-25 DIAGNOSIS — F17210 Nicotine dependence, cigarettes, uncomplicated: Secondary | ICD-10-CM | POA: Diagnosis not present

## 2020-05-25 DIAGNOSIS — I2510 Atherosclerotic heart disease of native coronary artery without angina pectoris: Secondary | ICD-10-CM | POA: Insufficient documentation

## 2020-05-25 DIAGNOSIS — I10 Essential (primary) hypertension: Secondary | ICD-10-CM | POA: Diagnosis not present

## 2020-05-25 DIAGNOSIS — Z85828 Personal history of other malignant neoplasm of skin: Secondary | ICD-10-CM | POA: Diagnosis not present

## 2020-05-25 DIAGNOSIS — R079 Chest pain, unspecified: Secondary | ICD-10-CM | POA: Diagnosis not present

## 2020-05-25 DIAGNOSIS — R0600 Dyspnea, unspecified: Secondary | ICD-10-CM | POA: Diagnosis present

## 2020-05-25 DIAGNOSIS — R0602 Shortness of breath: Secondary | ICD-10-CM | POA: Diagnosis not present

## 2020-05-25 LAB — CBC
HCT: 46.3 % — ABNORMAL HIGH (ref 36.0–46.0)
Hemoglobin: 15.2 g/dL — ABNORMAL HIGH (ref 12.0–15.0)
MCH: 31.2 pg (ref 26.0–34.0)
MCHC: 32.8 g/dL (ref 30.0–36.0)
MCV: 95.1 fL (ref 80.0–100.0)
Platelets: 172 10*3/uL (ref 150–400)
RBC: 4.87 MIL/uL (ref 3.87–5.11)
RDW: 12.4 % (ref 11.5–15.5)
WBC: 5.3 10*3/uL (ref 4.0–10.5)
nRBC: 0 % (ref 0.0–0.2)

## 2020-05-25 LAB — BASIC METABOLIC PANEL
Anion gap: 6 (ref 5–15)
BUN: 16 mg/dL (ref 8–23)
CO2: 28 mmol/L (ref 22–32)
Calcium: 9.1 mg/dL (ref 8.9–10.3)
Chloride: 105 mmol/L (ref 98–111)
Creatinine, Ser: 0.79 mg/dL (ref 0.44–1.00)
GFR, Estimated: >60 mL/min (ref >60)
Glucose, Bld: 250 mg/dL — ABNORMAL HIGH (ref 70–99)
Potassium: 4 mmol/L (ref 3.5–5.1)
Sodium: 139 mmol/L (ref 135–145)

## 2020-05-25 LAB — D-DIMER, QUANTITATIVE: D-Dimer, Quant: 0.29 ug/mL-FEU (ref 0.00–0.50)

## 2020-05-25 LAB — TROPONIN I (HIGH SENSITIVITY)
Troponin I (High Sensitivity): 17 ng/L (ref <18)
Troponin I (High Sensitivity): 19 ng/L — ABNORMAL HIGH (ref <18)

## 2020-05-25 MED ORDER — PREDNISONE 20 MG PO TABS: 40.0000 mg | ORAL_TABLET | Freq: Every day | ORAL | 0 refills | Status: DC

## 2020-05-25 MED ORDER — ALBUTEROL SULFATE HFA 108 (90 BASE) MCG/ACT IN AERS: 1.0000 | INHALATION_SPRAY | RESPIRATORY_TRACT | 0 refills | Status: DC | PRN

## 2020-05-25 NOTE — ED Provider Notes (Signed)
[facility] EMERGENCY DEPARTMENT Provider Note   CSN: [id number] Arrival date & time: 05/25/20  1159     History Chief Complaint  Patient presents with  . Shortness of Breath    Melanie Lucas is a 70 y.o. female.  HPI Patient with multiple medical issues, notably COPD, history of smoking, but with ongoing Eliquis use presents with concern of right pain leg, and dyspnea. Right leg pain onset was a few days ago.  It is focally in the lateral superior proximal thigh.  No noted size discrepancy with contralateral leg. No distal loss of sensation or weakness noted. History is obtained by the patient and our triage nursing staff.  Patient's dyspnea has been worsening over the past few days, though without clear precipitant, or any clear alleviating or exacerbating factors. No chest pain early though there was some earlier.  After presenting to her physician she was sent here for evaluation of blood clots, according to the patient.    Past Medical History:  Diagnosis Date  . Anxiety   . Cancer (HCC)    Skin- leg   . Chronic acquired lymphedema    a. R>L;  b. 03/2012 Neg LE U/S for DVT. Legs since age 44  . Complication of anesthesia    woke up during colonoscopy, Lithrostrixpy, Biospy  . COPD (chronic obstructive pulmonary disease) ([place])   . Coronary artery disease   . CVD (cardiovascular disease)   . Deaf, right   . Diabetes mellitus    Type II  . Diabetic neuropathy ([place])   . Dysrhythmia    afib  . Elevated TSH    a. 10/2012 - inst to f/u PCP.  . Full dentures   . Hematuria    a. while on pradaxa,  she reports that she has seen Dr Margie Ege and had low risk cystoscopy  . History of kidney stones   . Hyperlipidemia   . Hypertension    patient denies  . Incontinence    prior to urinating  . Lacunar infarction ([place])    a. 02/2009 non-acute Lacunar infarct of the right thalamus noted on MRI of brain.  . Left carotid stenosis   . Lymphedema of extremity    had  this problem, since she was a teenager. especially seen in R LE  . Meniere disease   . Myocardial infarction ([place])   . Obesity   . Osteoarthritis    cervical & lumbar region, knees, hands cramp also   . PAF with post-termination pauses    a. on dronedarone;  b. CHA2DS2VASc = 5 (HTN, DM, h/o lacunar infarct on MRI, Female) ->refused oral anticoagulation after h/o hematuria on pradaxa;  c. 02/2012 Echo: EF 55-60%, mildly dil LA. D. Recurrent PAF 10/2012 after only taking Multaq 1x/day - spont conv to NSR, placed back on BID Multaq/eliquis;  e. 10/2014 Multaq d/c'd->tikosyn initiated.  . Shortness of breath dyspnea    with exertion  . Stroke [facility])    2 mini strokes  . Tobacco abuse   . Vision abnormalities     Patient Active Problem List   Diagnosis Date Noted  . Stroke-like episode 01/21/2018  . Acute metabolic encephalopathy 27/74/1287  . Depression 12/31/2017  . Stroke ([place]) 12/31/2017  . COPD (chronic obstructive pulmonary disease) ([place]) 12/30/2017  . Left carotid artery stenosis 07/29/2017  . TIA (transient ischemic attack) 06/24/2017  . Ischemic stroke ([place]) 06/23/2017  . Cervical spondylosis with myelopathy 02/21/2016  . Bilateral leg edema 11/24/2014  .  Cervical stenosis of spine 11/24/2014  . Numbness of fingers of both hands 11/24/2014  . HLD (hyperlipidemia) 11/09/2014  . Diabetes mellitus without complication ([place]) 52/77/8242  . Pulmonary hypertension ([place]) 08/07/2013  . CAD (coronary artery disease) 03/11/2013  . Peripheral edema 02/25/2012  . Essential hypertension, benign 05/06/2009  . Overweight 04/13/2009  . TOBACCO ABUSE 04/13/2009  . Paroxysmal atrial fibrillation ([place]) 04/13/2009  . CHEST PAIN, ATYPICAL 04/13/2009    Past Surgical History:  Procedure Laterality Date  . ANTERIOR CERVICAL CORPECTOMY N/A 02/21/2016   Procedure: ANTERIOR CERVICAL CORPECTOMY ANS FUSION CERVICAL SIX , ANTERIOR PLATING CERVICAL FIVE-SEVEN;  Surgeon: Consuella Lose, MD;  Location:  [place];  Service: Neurosurgery;  Laterality: N/A;  . ANTERIOR FUSION CERVICAL SPINE  02/21/2016  . Biopsy of adrenal glands    . BREAST BIOPSY Bilateral    benign results, both breasts   . CARDIAC CATHETERIZATION  2014   x1 stent placed, done in [place]   . CARDIOVERSION N/A 11/11/2014   Procedure: CARDIOVERSION;  Surgeon: Larey Dresser, MD;  Location: [facility];  Service: Cardiovascular;  Laterality: N/A;  . CAROTID ENDARTERECTOMY Left 07/29/2017  . COLONOSCOPY W/ POLYPECTOMY    . CORONARY STENT PLACEMENT    . ENDARTERECTOMY Left 07/29/2017   Procedure: ENDARTERECTOMY CAROTID LEFT;  Surgeon: Rosetta Posner, MD;  Location: [place] [facility];  Service: Vascular;  Laterality: Left;  . PATCH ANGIOPLASTY Left 07/29/2017   Procedure: PATCH ANGIOPLASTY USING HEMASHIELD GOLD VASCULAR PATCH;  Surgeon: Rosetta Posner, MD;  Location: [facility];  Service: Vascular;  Laterality: Left;  . TEE WITHOUT CARDIOVERSION N/A 11/11/2014   Procedure: TRANSESOPHAGEAL ECHOCARDIOGRAM (TEE);  Surgeon: Larey Dresser, MD;  Location: [facility] ENDOSCOPY;  Service: Cardiovascular;  Laterality: N/A;     OB History   No obstetric history on file.     Family History  Problem Relation Age of Onset  . Heart attack Father   . Coronary artery disease Father        strong family hx  . Bladder Cancer Mother        bladder  . Leukemia Brother   . Breast cancer Maternal Aunt   . Breast cancer Paternal Aunt   . Breast cancer Maternal Aunt     Social History   Tobacco Use  . Smoking status: Current Every Day Smoker    Packs/day: 1.00    Years: 55.00    Pack years: 55.00    Types: Cigarettes  . Smokeless tobacco: Never Used  . Tobacco comment: 1 pack daily  Vaping Use  . Vaping Use: Never used  Substance Use Topics  . Alcohol use: Yes    Alcohol/week: 1.0 standard drink    Types: 1 Glasses of wine per week  . Drug use: No    Home Medications Prior to Admission medications   Medication Sig Start Date End Date Taking?  Authorizing Provider  diazepam (VALIUM) 10 MG tablet Take 10 mg by mouth at bedtime as needed for sleep. 03/18/20  Yes [provider]  dofetilide (TIKOSYN) 500 MCG capsule Take 1 capsule (500 mcg total) by mouth 2 (two) times daily. 02/08/20  Yes Sherran Needs, NP  DULoxetine (CYMBALTA) 60 MG capsule Take 60 mg by mouth daily.  07/14/19  Yes [provider]  ELIQUIS 5 MG TABS tablet TAKE 1 TABLET BY MOUTH TWICE A DAY Patient taking differently: Take 5 mg by mouth 2 (two) times daily. 01/11/20  Yes Thompson Grayer, MD  Empagliflozin-metFORMIN HCl 12.06-998  MG TABS Take 1 tablet by mouth 2 (two) times daily.   Yes [provider]  furosemide (LASIX) 20 MG tablet Take 20 mg by mouth as needed for edema.    Yes [provider]  ibuprofen (ADVIL) 200 MG tablet Take 800 mg by mouth 3 (three) times daily as needed for mild pain.    Yes [provider]  linaclotide (LINZESS) 290 MCG CAPS capsule Take 290 mcg by mouth daily before breakfast.   Yes [provider]  losartan (COZAAR) 25 MG tablet Take 25 mg by mouth daily. 05/23/20  Yes [provider]  mirabegron ER (MYRBETRIQ) 50 MG TB24 tablet Take 50 mg by mouth daily.   Yes [provider]  Multiple Vitamin (MULTIVITAMIN) tablet Take 1 tablet by mouth at bedtime.    Yes [provider]  Plecanatide 3 MG TABS Take 3 mg by mouth daily.   Yes [provider]  potassium chloride SA (KLOR-CON) 20 MEQ tablet Take 1 tablet (20 mEq total) by mouth daily. Patient taking differently: Take 20 mEq by mouth 2 (two) times daily. 04/27/20  Yes Sherran Needs, NP  predniSONE (DELTASONE) 20 MG tablet Take 2 tablets (40 mg total) by mouth daily with breakfast. For the next four days 05/25/20  Yes Carmin Muskrat, MD  repaglinide (PRANDIN) 2 MG tablet Take 2 mg by mouth in the morning and at bedtime. 12/20/18  Yes [provider]  rosuvastatin (CRESTOR) 5 MG tablet Take 5 mg by  mouth at bedtime.    Yes [provider]  albuterol (VENTOLIN HFA) 108 (90 Base) MCG/ACT inhaler Inhale 1 puff into the lungs every 4 (four) hours as needed for shortness of breath. 05/25/20   Carmin Muskrat, MD    Allergies    Avelox [moxifloxacin], Chantix [varenicline], Fosamax [alendronate], Pradaxa [dabigatran etexilate mesylate], Statins, Sulfamethoxazole, Sulfonamide derivatives, Ciprofloxacin, Gabapentin, and Tape  Review of Systems   Review of Systems  Constitutional:       Per HPI, otherwise negative  HENT:       Per HPI, otherwise negative  Respiratory:       Per HPI, otherwise negative  Cardiovascular:       Per HPI, otherwise negative  Gastrointestinal: Negative for vomiting.  Endocrine:       Negative aside from HPI  Genitourinary:       Neg aside from HPI   Musculoskeletal:       Per HPI, otherwise negative  Skin: Negative.   Neurological: Negative for syncope.    Physical Exam Updated Vital Signs BP (!) 164/58   Pulse (!) 53   Temp 98.6 F (37 C) (Oral)   Resp 16   Ht 5\' 7"  (1.702 m)   Wt 74.8 kg   SpO2 90%   BMI 25.84 kg/m   Physical Exam Vitals and nursing note reviewed.  Constitutional:      General: She is not in acute distress.    Appearance: She is well-developed.  HENT:     Head: Normocephalic and atraumatic.  Eyes:     Conjunctiva/sclera: Conjunctivae normal.  Cardiovascular:     Rate and Rhythm: Normal rate and regular rhythm.  Pulmonary:     Effort: Pulmonary effort is normal. No respiratory distress.     Breath sounds: Normal breath sounds. No stridor.  Abdominal:     General: There is no distension.  Musculoskeletal:       Legs:  Skin:    General: Skin is warm and  dry.  Neurological:     Mental Status: She is alert and oriented to person, place, and time.     Cranial Nerves: No cranial nerve deficit.      ED Results / Procedures / Treatments   Labs (all labs ordered are listed, but only abnormal results are  displayed) Labs Reviewed  BASIC METABOLIC PANEL - Abnormal; Notable for the following components:      Result Value   Glucose, Bld 250 (*)    All other components within normal limits  CBC - Abnormal; Notable for the following components:   Hemoglobin 15.2 (*)    HCT 46.3 (*)    All other components within normal limits  TROPONIN I (HIGH SENSITIVITY) - Abnormal; Notable for the following components:   Troponin I (High Sensitivity) 19 (*)    All other components within normal limits  D-DIMER, QUANTITATIVE  TROPONIN I (HIGH SENSITIVITY)    EKG EKG Interpretation  Date/Time:  Wednesday May 25 2020 12:06:49 EDT Ventricular Rate:  52 PR Interval:  104 QRS Duration: 118 QT Interval:  478 QTC Calculation: 444 R Axis:   -70 Text Interpretation: Sinus bradycardia with short PR Right bundle branch block Left anterior fascicular block  Bifascicular block  Septal infarct , age undetermined Abnormal ECG Confirmed by Carmin Muskrat [phone]) on 05/25/2020 1:41:15 PM   Radiology DG Chest 2 View  Result Date: 05/25/2020 CLINICAL DATA:  Chest pain.  Shortness of breath.  Right leg pain. EXAM: CHEST - 2 VIEW COMPARISON:  08/14/2018 FINDINGS: The cardiomediastinal silhouette is within normal limits. Aortic atherosclerosis is noted. The lungs are hyperinflated with chronic interstitial coarsening consistent with the patient's history of COPD. No confluent airspace opacity, overt pulmonary edema, pleural effusion, pneumothorax is identified. Cervical spine fusion and S-shaped thoracolumbar scoliosis are noted. IMPRESSION: No active cardiopulmonary disease. Electronically Signed   By: Logan Bores M.D.   On: 05/25/2020 13:03    Procedures Procedures   Medications Ordered in ED Medications - No data to display  ED Course  I have reviewed the triage vital signs and the nursing notes.  Pertinent labs & imaging results that were available during my care of the patient were reviewed by me and  considered in my medical decision making (see chart for details).   Repeat exam the patient is sleeping.  She awakens easily.   3:56 PM Patient sleeping, again awakens easily. Findings discussed, reviewed, x-ray without evidence for pneumonia, she remains without oxygen requirement, with no increased work of breathing, there are some suspicion for COPD contributing to her dyspnea. Patient's leg pain may be musculoskeletal in etiology, without other notable physical exam findings, with negative D-dimer, low suspicion for DVT/PE. In addition, the patient is using her Eliquis, as directed. Patient appropriate for initiation of steroids, albuterol, follow-up with primary care.  MDM Rules/Calculators/A&P MDM Number of Diagnoses or Management Options COPD exacerbation ([place]): established, worsening Right leg pain: new, needed workup   Amount and/or Complexity of Data Reviewed Clinical lab tests: reviewed and ordered Tests in the radiology section of CPT: reviewed and ordered Tests in the medicine section of CPT: reviewed and ordered Independent visualization of images, tracings, or specimens: yes  Risk of Complications, Morbidity, and/or Mortality Presenting problems: high Diagnostic procedures: high Management options: high  Critical Care Total time providing critical care: < 30 minutes  Patient Progress Patient progress: stable  Final Clinical Impression(s) / ED Diagnoses Final diagnoses:  COPD exacerbation ([place])  Right leg pain    Rx /  DC Orders ED Discharge Orders         Ordered    predniSONE (DELTASONE) 20 MG tablet  Daily with breakfast        05/25/20 1554    albuterol (VENTOLIN HFA) 108 (90 Base) MCG/ACT inhaler  Every 4 hours PRN        05/25/20 1554           Carmin Muskrat, MD 05/25/20 1557

## 2020-05-25 NOTE — Discharge Instructions (Signed)
As discussed, today's evaluation has been generally reassuring. Your breathing difficulties are likely due to your smoking, and COPD.  Using steroids, and albuterol should improve your condition over the coming days.  There is no evidence today for blood clots, either in your lungs or leg.  Your leg pain is likely due to soft tissue inflammation.  The steroids provided for your COPD exacerbation should assist in recovery from this as well.  Monitor your condition carefully and do not hesitate to return here.  Otherwise follow-up with your physician.

## 2020-05-25 NOTE — ED Provider Notes (Signed)
Patient placed in Quick Look pathway, seen and evaluated   Chief Complaint: short of breath.  Pt reports pain in her right leg.  Pt reports her MD told her to come in for evaluation for blood clot.  Pt concerned about allergies   HPI:  On eliquis   ROS: no fever  Physical Exam:   Gen: No distress  Neuro: Awake and Alert  Skin: Warm    Focused Exam: congested, mouth breathing   Initiation of care has begun. The patient has been counseled on the process, plan, and necessity for staying for the completion/evaluation, and the remainder of the medical screening examination   Sidney Ace 05/25/20 1221    Carmin Muskrat, MD 05/26/20 1000

## 2020-05-25 NOTE — ED Triage Notes (Addendum)
Pt BIB EMS from home due to sob. Pt was sent from PCP.Pt reports they told her she needs to be rule out for possible blood clot.

## 2020-06-06 DIAGNOSIS — Z0000 Encounter for general adult medical examination without abnormal findings: Secondary | ICD-10-CM | POA: Diagnosis not present

## 2020-06-06 DIAGNOSIS — Z1389 Encounter for screening for other disorder: Secondary | ICD-10-CM | POA: Diagnosis not present

## 2020-06-13 ENCOUNTER — Other Ambulatory Visit: Payer: Self-pay | Admitting: Nurse Practitioner

## 2020-06-14 DIAGNOSIS — D6869 Other thrombophilia: Secondary | ICD-10-CM | POA: Diagnosis not present

## 2020-06-14 DIAGNOSIS — I480 Paroxysmal atrial fibrillation: Secondary | ICD-10-CM | POA: Diagnosis not present

## 2020-06-14 DIAGNOSIS — M533 Sacrococcygeal disorders, not elsewhere classified: Secondary | ICD-10-CM | POA: Diagnosis not present

## 2020-06-14 DIAGNOSIS — E1140 Type 2 diabetes mellitus with diabetic neuropathy, unspecified: Secondary | ICD-10-CM | POA: Diagnosis not present

## 2020-06-14 DIAGNOSIS — M810 Age-related osteoporosis without current pathological fracture: Secondary | ICD-10-CM | POA: Diagnosis not present

## 2020-06-14 DIAGNOSIS — F3341 Major depressive disorder, recurrent, in partial remission: Secondary | ICD-10-CM | POA: Diagnosis not present

## 2020-06-17 DIAGNOSIS — K5730 Diverticulosis of large intestine without perforation or abscess without bleeding: Secondary | ICD-10-CM | POA: Diagnosis not present

## 2020-06-17 DIAGNOSIS — Z86010 Personal history of colonic polyps: Secondary | ICD-10-CM | POA: Diagnosis not present

## 2020-06-17 DIAGNOSIS — D122 Benign neoplasm of ascending colon: Secondary | ICD-10-CM | POA: Diagnosis not present

## 2020-06-17 DIAGNOSIS — D123 Benign neoplasm of transverse colon: Secondary | ICD-10-CM | POA: Diagnosis not present

## 2020-06-17 DIAGNOSIS — K635 Polyp of colon: Secondary | ICD-10-CM | POA: Diagnosis not present

## 2020-06-17 DIAGNOSIS — K648 Other hemorrhoids: Secondary | ICD-10-CM | POA: Diagnosis not present

## 2020-06-21 DIAGNOSIS — D122 Benign neoplasm of ascending colon: Secondary | ICD-10-CM | POA: Diagnosis not present

## 2020-06-21 DIAGNOSIS — D123 Benign neoplasm of transverse colon: Secondary | ICD-10-CM | POA: Diagnosis not present

## 2020-06-21 DIAGNOSIS — K635 Polyp of colon: Secondary | ICD-10-CM | POA: Diagnosis not present

## 2020-07-06 ENCOUNTER — Other Ambulatory Visit (HOSPITAL_COMMUNITY): Payer: Self-pay | Admitting: *Deleted

## 2020-07-06 NOTE — Discharge Instructions (Signed)
Zoledronic Acid Injection (Paget's Disease, Osteoporosis) What is this medicine? ZOLEDRONIC ACID (ZOE le dron ik AS id) slows calcium loss from bones. It treats Paget's disease and osteoporosis. It may be used in other people at risk for bone loss. This medicine may be used for other purposes; ask your health care provider or pharmacist if you have questions. COMMON BRAND NAME(S): Reclast, Zometa What should I tell my health care provider before I take this medicine? They need to know if you have any of these conditions:  bleeding disorder  cancer  dental disease  kidney disease  low levels of calcium in the blood  low red blood cell counts  lung or breathing disease (asthma)  receiving steroids like dexamethasone or prednisone  an unusual or allergic reaction to zoledronic acid, other medicines, foods, dyes, or preservatives  pregnant or trying to get pregnant  breast-feeding How should I use this medicine? This drug is injected into a vein. It is given by a health care provider in a hospital or clinic setting. A special MedGuide will be given to you before each treatment. Be sure to read this information carefully each time. Talk to your health care provider about the use of this drug in children. Special care may be needed. Overdosage: If you think you have taken too much of this medicine contact a poison control center or emergency room at once. NOTE: This medicine is only for you. Do not share this medicine with others. What if I miss a dose? Keep appointments for follow-up doses. It is important not to miss your dose. Call your health care provider if you are unable to keep an appointment. What may interact with this medicine?  certain antibiotics given by injection  NSAIDs, medicines for pain and inflammation, like ibuprofen or naproxen  some diuretics like bumetanide, furosemide  teriparatide This list may not describe all possible interactions. Give your health  care provider a list of all the medicines, herbs, non-prescription drugs, or dietary supplements you use. Also tell them if you smoke, drink alcohol, or use illegal drugs. Some items may interact with your medicine. What should I watch for while using this medicine? Visit your health care provider for regular checks on your progress. It may be some time before you see the benefit from this drug. Some people who take this drug have severe bone, joint, or muscle pain. This drug may also increase your risk for jaw problems or a broken thigh bone. Tell your health care provider right away if you have severe pain in your jaw, bones, joints, or muscles. Tell you health care provider if you have any pain that does not go away or that gets worse. You should make sure you get enough calcium and vitamin D while you are taking this drug. Discuss the foods you eat and the vitamins you take with your health care provider. You may need blood work done while you are taking this drug. Tell your dentist and dental surgeon that you are taking this drug. You should not have major dental surgery while on this drug. See your dentist to have a dental exam and fix any dental problems before starting this drug. Take good care of your teeth while on this drug. Make sure you see your dentist for regular follow-up appointments. What side effects may I notice from receiving this medicine? Side effects that you should report to your doctor or health care provider as soon as possible:  allergic reactions (skin rash, itching or   hives; swelling of the face, lips, or tongue)  bone pain  infection (fever, chills, cough, sore throat, pain or trouble passing urine)  jaw pain, especially after dental work  joint pain  kidney injury (trouble passing urine or change in the amount of urine)  low calcium levels (fast heartbeat; muscle cramps or pain; pain, tingling, or numbness in the hands or feet; seizures)  low red blood cell  counts (trouble breathing; feeling faint; lightheaded, falls; unusually weak or tired)  muscle pain  palpitations  redness, blistering, peeling, or loosening of the skin, including inside the mouth Side effects that usually do not require medical attention (report to your doctor or health care provider if they continue or are bothersome):  diarrhea  eye irritation, itching, or pain  fever  general ill feeling or flu-like symptoms  headache  increase in blood pressure  nausea  pain, redness, or irritation at site where injected  stomach pain  upset stomach This list may not describe all possible side effects. Call your doctor for medical advice about side effects. You may report side effects to FDA at 1-800-FDA-1088. Where should I keep my medicine? This drug is given in a hospital or clinic. It will not be stored at home. NOTE: This sheet is a summary. It may not cover all possible information. If you have questions about this medicine, talk to your doctor, pharmacist, or health care provider.  2021 Elsevier/Gold Standard (2018-11-24 11:46:18)   

## 2020-07-07 ENCOUNTER — Inpatient Hospital Stay (HOSPITAL_COMMUNITY)
Admission: RE | Admit: 2020-07-07 | Discharge: 2020-07-07 | Disposition: A | Discharge status: Home / Self Care | Payer: Medicare PPO | Source: Ambulatory Visit | Attending: Family Medicine | Admitting: Family Medicine

## 2020-07-07 ENCOUNTER — Encounter (HOSPITAL_COMMUNITY): Payer: Self-pay

## 2020-07-19 ENCOUNTER — Other Ambulatory Visit: Payer: Self-pay | Admitting: *Deleted

## 2020-07-19 DIAGNOSIS — Z87891 Personal history of nicotine dependence: Secondary | ICD-10-CM

## 2020-07-19 DIAGNOSIS — F17210 Nicotine dependence, cigarettes, uncomplicated: Secondary | ICD-10-CM

## 2020-08-01 ENCOUNTER — Encounter: Payer: Self-pay | Admitting: Neurology

## 2020-08-01 ENCOUNTER — Ambulatory Visit: Payer: Medicare PPO | Admitting: Neurology

## 2020-08-01 VITALS — BP 150/67 | HR 61 | Ht 67.0 in | Wt 155.6 lb

## 2020-08-01 DIAGNOSIS — R269 Unspecified abnormalities of gait and mobility: Secondary | ICD-10-CM | POA: Diagnosis not present

## 2020-08-01 NOTE — Progress Notes (Signed)
[facility] [address] [place]. [place] [address] [phone]       OFFICE FOLLOW UP VISIT NOTE  Ms. NARYAH CLENNEY Date of Birth:  1950/07/04 Medical Record Number:  [id number]   Referring MD: Jillyn Ledger FNP  Reason for Referral: Frequent falls and memory loss  HPI: Initial consult 04/11/2020 :Ms. Brian is a 70 year old Caucasian lady with past medical history of chronic lymphedema in the legs, coronary artery disease, COPD, deafness in the right ear, diabetes, neuropathy, chronic atrial fibrillation, hypertension, hyperlipidemia, right thalamic infarct January 2011, left carotid stenosis s/p surgery, (TIA in May 2019, right corona radiata infarct November 2019, right periventricular infarct December 2019 with mild residual memory difficulties and imbalance.  She is referred back to me as she has had frequent falls in the last 3 to 6 months.  Patient's has had several falls while trying to get out of bed.  She is states that she has a new mattress and she sleeps off it.  She had 1 fall when she was painting her house which she had put up for sale.  After the fall she was confused and disoriented for short time and EMS had to be called.  She got better and did not go to see the doctor until several days later which prompted this referral.  Patient also noticed that her short-term memory seems to be getting worse.  She has trouble remembering recent information with she has taught herself to write things down and be organized and may remember later.  She denies any headaches, new stroke or TIA-like symptoms, seizures, loss of consciousness.  She remains on Eliquis for chronic atrial fibrillation is tolerating it well with only minor bruising and no bleeding.  She has had no recent stroke or TIA symptoms.  She remains independent in activities of daily living and manages own affairs.   She was last seen in the office for follow-up on 09/30/2018 and has not returned for follow-up after  that. Update 08/01/2020: She returns for follow-up after last visit nearly 4 months ago.  Patient states that she has been feeling quite depressed recently.  She has been on Cymbalta now for about 6 months but its not working however she does have an appointment to see primary care physician next week and plans to discuss medication increase or change.  Main complaint today is low back pain and right hip pain which seems to be bothering her a lot and limits her walking.  She has trouble bending down and if she were to sit on the ground she has trouble getting up.  She did undergo lab work at last visit and hemoglobin A1c was 6.2 and LDL 62 mg percent.  Vitamin B12, TSH, RPR and homocystine were all normal on 04/11/2020.  She had EEG done on 05/09/2020 which was also normal.  She has not had any recurrent stroke or TIA symptoms.  She states her blood pressure is well controlled at today's elevated slightly at 150/67.  She remains on Eliquis which is tolerating well without bruising or bleeding.  She is tolerating Crestor well without muscle aches and pains. ROS:   14 system review of systems is positive for depression, hip pain, back pain memory loss, imbalance, falls, cognitive difficulties, bruising all other systems negative  PMH:  Past Medical History:  Diagnosis Date   Anxiety    Cancer ([place])    Skin- leg    Chronic acquired lymphedema    a. R>L;  b.  03/2012 Neg LE U/S for DVT. Legs since age 63   Complication of anesthesia    woke up during colonoscopy, Lithrostrixpy, Biospy   COPD (chronic obstructive pulmonary disease) ([place])    Coronary artery disease    CVD (cardiovascular disease)    Deaf, right    Diabetes mellitus    Type II   Diabetic neuropathy ([place])    Dysrhythmia    afib   Elevated TSH    a. 10/2012 - inst to f/u PCP.   Full dentures    Hematuria    a. while on pradaxa,  she reports that she has seen Dr Margie Ege and had low risk cystoscopy   History of kidney stones     Hyperlipidemia    Hypertension    patient denies   Incontinence    prior to urinating   Lacunar infarction [facility])    a. 02/2009 non-acute Lacunar infarct of the right thalamus noted on MRI of brain.   Left carotid stenosis    Lymphedema of extremity    had this problem, since she was a teenager. especially seen in R LE   Meniere disease    Myocardial infarction [facility])    Obesity    Osteoarthritis    cervical & lumbar region, knees, hands cramp also    PAF with post-termination pauses    a. on dronedarone;  b. CHA2DS2VASc = 5 (HTN, DM, h/o lacunar infarct on MRI, Female) ->refused oral anticoagulation after h/o hematuria on pradaxa;  c. 02/2012 Echo: EF 55-60%, mildly dil LA. D. Recurrent PAF 10/2012 after only taking Multaq 1x/day - spont conv to NSR, placed back on BID Multaq/eliquis;  e. 10/2014 Multaq d/c'd->tikosyn initiated.   Shortness of breath dyspnea    with exertion   Stroke ([place])    2 mini strokes   Tobacco abuse    Vision abnormalities     Social History:  Social History   Socioeconomic History   Marital status: Married    Spouse name: Ronny   Number of children: 2   Years of education: 18   Highest education level: Master's degree (e.g., MA, MS, MEng, MEd, MSW, MBA)  Occupational History   Occupation: retired    Comment: Pharmacist, hospital - art  Tobacco Use   Smoking status: Every Day    Packs/day: 1.00    Years: 55.00    Pack years: 55.00    Types: Cigarettes   Smokeless tobacco: Never   Tobacco comments:    1 pack daily  Vaping Use   Vaping Use: Never used  Substance and Sexual Activity   Alcohol use: Yes    Alcohol/week: 1.0 standard drink    Types: 1 Glasses of wine per week   Drug use: No   Sexual activity: Yes  Other Topics Concern   Not on file  Social History Narrative   Lives with daughter and grandson   Right handed   Drinks 3-5 cups caffeine daily   She operates an entertainment business   Social Determinants of Radio broadcast assistant  Strain: Not on file  Food Insecurity: Not on file  Transportation Needs: Not on file  Physical Activity: Not on file  Stress: Not on file  Social Connections: Not on file  Intimate Partner Violence: Not on file    Medications:   Current Outpatient Medications on File Prior to Visit  Medication Sig Dispense Refill   albuterol (VENTOLIN HFA) 108 (90 Base) MCG/ACT inhaler Inhale 1 puff into the lungs every  4 (four) hours as needed for shortness of breath. 1 each 0   diazepam (VALIUM) 10 MG tablet Take 10 mg by mouth at bedtime as needed for sleep.     dofetilide (TIKOSYN) 500 MCG capsule TAKE 1 CAPSULE BY MOUTH 2 TIMES DAILY. 180 capsule 2   DULoxetine (CYMBALTA) 60 MG capsule Take 60 mg by mouth daily.      ELIQUIS 5 MG TABS tablet TAKE 1 TABLET BY MOUTH TWICE A DAY (Patient taking differently: Take 5 mg by mouth 2 (two) times daily.) 180 tablet 1   Empagliflozin-metFORMIN HCl 12.06-998 MG TABS Take 1 tablet by mouth 2 (two) times daily.     furosemide (LASIX) 20 MG tablet Take 20 mg by mouth as needed for edema.      ibuprofen (ADVIL) 200 MG tablet Take 800 mg by mouth 3 (three) times daily as needed for mild pain.      linaclotide (LINZESS) 290 MCG CAPS capsule Take 290 mcg by mouth daily before breakfast.     losartan (COZAAR) 25 MG tablet Take 25 mg by mouth daily.     mirabegron ER (MYRBETRIQ) 50 MG TB24 tablet Take 50 mg by mouth daily.     Multiple Vitamin (MULTIVITAMIN) tablet Take 1 tablet by mouth at bedtime.      Plecanatide 3 MG TABS Take 3 mg by mouth daily.     potassium chloride SA (KLOR-CON M20) 20 MEQ tablet Take 1 tablet (20 mEq total) by mouth 2 (two) times daily. 180 tablet 1   predniSONE (DELTASONE) 20 MG tablet Take 2 tablets (40 mg total) by mouth daily with breakfast. For the next four days 8 tablet 0   repaglinide (PRANDIN) 2 MG tablet Take 2 mg by mouth in the morning and at bedtime.     rosuvastatin (CRESTOR) 5 MG tablet Take 5 mg by mouth at bedtime.      No  current facility-administered medications on file prior to visit.    Allergies:   Allergies  Allergen Reactions   Avelox [Moxifloxacin]     Unknown reaction    Chantix [Varenicline]     nightmares   Fosamax [Alendronate]     Pain all over   Pradaxa [Dabigatran Etexilate Mesylate]     Extreme bleeding   Statins Nausea And Vomiting and Other (See Comments)    Muscle pain, Dizziness (intolerance)   Sulfamethoxazole Nausea And Vomiting    Dizziness (intolerance)   Sulfonamide Derivatives Hives    Nausea vertigo   Ciprofloxacin Rash   Gabapentin Rash    burning   Tape Itching and Rash    Please use "paper" tape only    Physical Exam General: well developed, well nourished elderly Caucasian lady, seated, in no evident distress.  She looks depressed. Head: head normocephalic and atraumatic.   Neck: supple with no carotid or supraclavicular bruits Cardiovascular: regular rate and rhythm, no murmurs Musculoskeletal: no deformity Skin:  no rash/petichiae.  Bilateral lymphedema lower extremities. Vascular:  Normal pulses all extremities  Neurologic Exam Mental Status: Awake and fully alert. Oriented to place and time. Recent and remote memory intact. Attention span, concentration and fund of knowledge appropriate. Mood and affect appear depressed.  Diminished recall 2/3.  Mini-Mental status exam not done (last visit score 29/30).    Cranial Nerves: Fundoscopic exam not done s. Pupils equal, briskly reactive to light. Extraocular movements full without nystagmus. Visual fields full to confrontation. Hearing diminished bilaterally. Facial sensation intact. Face, tongue, palate moves normally and  symmetrically.  Motor: Normal bulk and tone. Normal strength in all tested extremity muscles. Sensory.: intact to touch , pinprick , but impaired position and vibratory sensation from ankle down bilaterally.  Positive Romberg's test..  Coordination: Rapid alternating movements normal in all  extremities. Finger-to-nose and heel-to-shin performed accurately bilaterally. Gait and Station: Arises from chair without difficulty. Stance is normal. Gait demonstrates normal stride length and balance .  Poor truncal balance and would fall if not caught to postural threat.  Not able to heel, toe and tandem walk without difficulty.  Reflexes: 1+ and symmetric except ankle jerks are depressed bilaterally.. Toes downgoing.       ASSESSMENT: 70 year old Caucasian lady with frequent falls likely multifactorial due to combination of prior strokes, diabetic neuropathy and small vessel disease and memory loss which likely is mild cognitive impairment and prior history of multiple lacunar strokes and TIA.  Vascular risk factors of atrial fibrillation, diabetes, hypertension, hyperlipidemia, age and cerebrovascular disease     PLAN: I had a long discussion with the patient with regards to the results of her lab work and EEG study and answered questions.  I recommend she continue Eliquis for secondary stroke prevention given history of chronic atrial fibrillation as well as maintain aggressive risk factor modification with strict control of hypertension with blood pressure goal below 130/90, lipids with LDL cholesterol goal below 70 mg percent and diabetes with hemoglobin A1c goal below 6.5%.  She was also encouraged to follow-up with her primary care physician for her depression which appears suboptimally treated as well as chronic hip and back pain and referral to a orthopedic specialist if indicated.  She return for follow-up in the future only as necessary and no routine schedule appointment was made.Greater than 50% time during this prolonged 63-minute  visit was spent on counseling and coordination of care about her frequent falls and memory difficulties and prior strokes and answering questions. Antony Contras, MD  [facility] [address] [place], [place]  [address]  Phone [phone] Fax [phone] Note: This document was prepared with digital dictation and possible smart phrase technology. Any transcriptional errors that result from this process are unintentional.

## 2020-08-01 NOTE — Patient Instructions (Signed)
I had a long discussion with the patient with regards to the results of her lab work and EEG study and answered questions.  I recommend she continue Eliquis for secondary stroke prevention given history of chronic atrial fibrillation as well as maintain aggressive risk factor modification with strict control of hypertension with blood pressure goal below 130/90, lipids with LDL cholesterol goal below 70 mg percent and diabetes with hemoglobin A1c goal below 6.5%.  She was also encouraged to follow-up with her primary care physician for her depression which appears suboptimally treated as well as chronic hip and back pain and referral to a orthopedic specialist if indicated.  She return for follow-up in the future only as necessary and no routine schedule appointment was made.

## 2020-08-15 ENCOUNTER — Encounter: Payer: Self-pay | Admitting: Acute Care

## 2020-08-15 ENCOUNTER — Other Ambulatory Visit: Payer: Self-pay

## 2020-08-15 ENCOUNTER — Ambulatory Visit
Admission: RE | Admit: 2020-08-15 | Discharge: 2020-08-15 | Disposition: A | Discharge status: Home / Self Care | Payer: Medicare PPO | Source: Ambulatory Visit | Attending: Acute Care | Admitting: Acute Care

## 2020-08-15 ENCOUNTER — Ambulatory Visit (INDEPENDENT_AMBULATORY_CARE_PROVIDER_SITE_OTHER): Payer: Medicare PPO | Admitting: Acute Care

## 2020-08-15 VITALS — BP 132/62 | HR 58 | Temp 98.7°F | Ht 67.0 in | Wt 159.2 lb

## 2020-08-15 DIAGNOSIS — F17210 Nicotine dependence, cigarettes, uncomplicated: Secondary | ICD-10-CM

## 2020-08-15 DIAGNOSIS — Z87891 Personal history of nicotine dependence: Secondary | ICD-10-CM

## 2020-08-15 NOTE — Patient Instructions (Signed)
Thank you for participating in the [place] [facility] Cancer Screening Program. It was our pleasure to meet you today. We will call you with the results of your scan within the next few days. Your scan will be assigned a Lung RADS category score by the physicians reading the scans.  This Lung RADS score determines follow up scanning.  See below for description of categories, and follow up screening recommendations. We will be in touch to schedule your follow up screening annually or based on recommendations of our providers. We will fax a copy of your scan results to your Primary Care Physician, or the physician who referred you to the program, to ensure they have the results. Please call the office if you have any questions or concerns regarding your scanning experience or results.  Our office number is [phone]. Please speak with Denise Phelps, RN. She is our Lung Cancer Screening RN. If she is unavailable when you call, please have the office staff send her a message. She will return your call at her earliest convenience. Remember, if your scan is normal, we will scan you annually as long as you continue to meet the criteria for the program. (Age 72-77, Current smoker or smoker who has quit within the last 15 years). If you are a smoker, remember, quitting is the single most powerful action that you can take to decrease your risk of lung cancer and other pulmonary, breathing related problems. We know quitting is hard, and we are here to help.  Please let us know if there is anything we can do to help you meet your goal of quitting. If you are a former smoker, congratulations. We are proud of you! Remain smoke free! Remember you can refer friends or family members through the number above.  We will screen them to make sure they meet criteria for the program. Thank you for helping us take better care of you by participating in Lung Screening.  Lung RADS Categories:  Lung RADS 1: no nodules  or definitely non-concerning nodules.  Recommendation is for a repeat annual scan in 12 months.  Lung RADS 2:  nodules that are non-concerning in appearance and behavior with a very low likelihood of becoming an active cancer. Recommendation is for a repeat annual scan in 12 months.  Lung RADS 3: nodules that are probably non-concerning , includes nodules with a low likelihood of becoming an active cancer.  Recommendation is for a 6-month repeat screening scan. Often noted after an upper respiratory illness. We will be in touch to make sure you have no questions, and to schedule your 6-month scan.  Lung RADS 4 A: nodules with concerning findings, recommendation is most often for a follow up scan in 3 months or additional testing based on our provider's assessment of the scan. We will be in touch to make sure you have no questions and to schedule the recommended 3 month follow up scan.  Lung RADS 4 B:  indicates findings that are concerning. We will be in touch with you to schedule additional diagnostic testing based on our provider's  assessment of the scan.   

## 2020-08-15 NOTE — Progress Notes (Signed)
Created in error

## 2020-08-15 NOTE — Progress Notes (Deleted)
pulm

## 2020-08-15 NOTE — Progress Notes (Signed)
Shared Decision Making Visit Lung Cancer Screening Program [phone])   Eligibility: Age 70 y.o. Pack Years Smoking History Calculation 55 pack year smoking history (# packs/per year x # years smoked) Recent History of coughing up blood  no Unexplained weight loss? no ( >Than 15 pounds within the last 6 months ) Prior History Lung / other cancer no (Diagnosis within the last 5 years already requiring surveillance chest CT Scans). Smoking Status Current Smoker Former Smokers: Years since quit:   Quit Date: NA  Visit Components: Discussion included one or more decision making aids. yes Discussion included risk/benefits of screening. yes Discussion included potential follow up diagnostic testing for abnormal scans. yes Discussion included meaning and risk of over diagnosis. yes Discussion included meaning and risk of False Positives. yes Discussion included meaning of total radiation exposure. yes  Counseling Included: Importance of adherence to annual lung cancer LDCT screening. yes Impact of comorbidities on ability to participate in the program. yes Ability and willingness to under diagnostic treatment. yes  Smoking Cessation Counseling: Current Smokers:  Discussed importance of smoking cessation. yes Information about tobacco cessation classes and interventions provided to patient. yes Patient provided with "ticket" for LDCT Scan. yes Symptomatic Patient. no  Counseling Diagnosis Code: Tobacco Use Z72.0 Asymptomatic Patient yes  Counseling (Intermediate counseling: > three minutes counseling) A3557 Former Smokers:  Discussed the importance of maintaining cigarette abstinence. yes Diagnosis Code: Personal History of Nicotine Dependence. D22.025 Information about tobacco cessation classes and interventions provided to patient. Yes Patient provided with "ticket" for LDCT Scan. yes Written Order for Lung Cancer Screening with LDCT placed in Epic. Yes (CT Chest Lung Cancer  Screening Low Dose W/O CM) [id number] Z12.2-Screening of respiratory organs Z87.891-Personal history of nicotine dependence  I have spent 25 minutes of face to face time with [name] discussing the risks and benefits of lung cancer screening. We viewed a power point together that explained in detail the above noted topics. We paused at intervals to allow for questions to be asked and answered to ensure understanding.We discussed that the single most powerful action that she can take to decrease her risk of developing lung cancer is to quit smoking. We discussed whether or not she is ready to commit to setting a quit date. We discussed options for tools to aid in quitting smoking including nicotine replacement therapy, non-nicotine medications, support groups, Quit Smart classes, and behavior modification. We discussed that often times setting smaller, more achievable goals, such as eliminating 1 cigarette a day for a week and then 2 cigarettes a day for a week can be helpful in slowly decreasing the number of cigarettes smoked. This allows for a sense of accomplishment as well as providing a clinical benefit. I gave her the " Be Stronger Than Your Excuses" card with contact information for community resources, classes, free nicotine replacement therapy, and access to mobile apps, text messaging, and on-line smoking cessation help. I have also given her my card and contact information in the event she needs to contact me. We discussed the time and location of the scan, and that either Doroteo Glassman RN or I will call with the results within 24-48 hours of receiving them. I have offered her  a copy of the power point we viewed  as a resource in the event they need reinforcement of the concepts we discussed today in the office. The patient verbalized understanding of all of  the above and had no further questions upon leaving the office. They have  my contact information in the event they have any further  questions.  I spent 4 minutes counseling on smoking cessation and the health risks of continued tobacco abuse.  I explained to the patient that there has been a high incidence of coronary artery disease noted on these exams. I explained that this is a non-gated exam therefore degree or severity cannot be determined. This patient is on statin therapy. I have asked the patient to follow-up with their PCP regarding any incidental finding of coronary artery disease and management with diet or medication as their PCP  feels is clinically indicated. The patient verbalized understanding of the above and had no further questions upon completion of the visit.      [name], NP 08/15/2020

## 2020-08-18 ENCOUNTER — Other Ambulatory Visit: Payer: Self-pay | Admitting: *Deleted

## 2020-08-18 DIAGNOSIS — Z87891 Personal history of nicotine dependence: Secondary | ICD-10-CM

## 2020-08-18 DIAGNOSIS — F17210 Nicotine dependence, cigarettes, uncomplicated: Secondary | ICD-10-CM

## 2020-08-18 NOTE — Progress Notes (Signed)
Please call patient and let them  know their  low dose Ct was read as a Lung RADS 2: nodules that are benign in appearance and behavior with a very low likelihood of becoming a clinically active cancer due to size or lack of growth. Recommendation per radiology is for a repeat LDCT in 12 months. Please let them  know we will order and schedule their  annual screening scan for 08/18/2021. Please let them  know there was notation of CAD on their  scan.  Please remind the patient  that this is a non-gated exam therefore degree or severity of disease  cannot be determined. Please have them  follow up with their PCP regarding potential risk factor modification, dietary therapy or pharmacologic therapy if clinically indicated. Pt.  is  currently on statin therapy. Please place order for annual  screening scan for  07/2021 and fax results to PCP. Thanks so much.  Langley Gauss, I have called the patient. I wanted to talk with her about the area on her right kidney that needs follow up MRI. I have asked her to call her PCP to discuss getting the follow up MRI recommended. She verbalized understanding and said she would call him. She is having pain in her right flank area.

## 2020-08-23 DIAGNOSIS — E1165 Type 2 diabetes mellitus with hyperglycemia: Secondary | ICD-10-CM | POA: Diagnosis not present

## 2020-08-23 DIAGNOSIS — Z8249 Family history of ischemic heart disease and other diseases of the circulatory system: Secondary | ICD-10-CM | POA: Diagnosis not present

## 2020-08-23 DIAGNOSIS — R1011 Right upper quadrant pain: Secondary | ICD-10-CM | POA: Diagnosis not present

## 2020-08-23 DIAGNOSIS — Z720 Tobacco use: Secondary | ICD-10-CM | POA: Diagnosis not present

## 2020-08-24 DIAGNOSIS — R109 Unspecified abdominal pain: Secondary | ICD-10-CM | POA: Diagnosis not present

## 2020-08-24 DIAGNOSIS — R1011 Right upper quadrant pain: Secondary | ICD-10-CM | POA: Diagnosis not present

## 2020-09-14 DIAGNOSIS — J449 Chronic obstructive pulmonary disease, unspecified: Secondary | ICD-10-CM | POA: Diagnosis not present

## 2020-09-14 DIAGNOSIS — E1140 Type 2 diabetes mellitus with diabetic neuropathy, unspecified: Secondary | ICD-10-CM | POA: Diagnosis not present

## 2020-09-14 DIAGNOSIS — T466X5D Adverse effect of antihyperlipidemic and antiarteriosclerotic drugs, subsequent encounter: Secondary | ICD-10-CM | POA: Diagnosis not present

## 2020-09-14 DIAGNOSIS — R809 Proteinuria, unspecified: Secondary | ICD-10-CM | POA: Diagnosis not present

## 2020-09-14 DIAGNOSIS — F3341 Major depressive disorder, recurrent, in partial remission: Secondary | ICD-10-CM | POA: Diagnosis not present

## 2020-09-14 DIAGNOSIS — M810 Age-related osteoporosis without current pathological fracture: Secondary | ICD-10-CM | POA: Diagnosis not present

## 2020-09-14 DIAGNOSIS — F411 Generalized anxiety disorder: Secondary | ICD-10-CM | POA: Diagnosis not present

## 2020-09-14 DIAGNOSIS — E876 Hypokalemia: Secondary | ICD-10-CM | POA: Diagnosis not present

## 2020-09-14 DIAGNOSIS — I700 Atherosclerosis of aorta: Secondary | ICD-10-CM | POA: Diagnosis not present

## 2020-10-19 DIAGNOSIS — R350 Frequency of micturition: Secondary | ICD-10-CM | POA: Diagnosis not present

## 2020-10-19 DIAGNOSIS — D6869 Other thrombophilia: Secondary | ICD-10-CM | POA: Diagnosis not present

## 2020-10-19 DIAGNOSIS — F411 Generalized anxiety disorder: Secondary | ICD-10-CM | POA: Diagnosis not present

## 2020-10-19 DIAGNOSIS — F3341 Major depressive disorder, recurrent, in partial remission: Secondary | ICD-10-CM | POA: Diagnosis not present

## 2020-10-19 DIAGNOSIS — J449 Chronic obstructive pulmonary disease, unspecified: Secondary | ICD-10-CM | POA: Diagnosis not present

## 2020-10-29 ENCOUNTER — Other Ambulatory Visit (HOSPITAL_COMMUNITY): Payer: Self-pay | Admitting: Internal Medicine

## 2020-10-31 NOTE — Telephone Encounter (Signed)
Pt last saw Roderic Palau, NP on 04/27/20, last labs 05/25/20 Creat 0.79, age 70, weight 72.2kg, based on specified criteria pt is on appropriate dosage of Eliquis '5mg'$  BID.  Will refill rx.

## 2020-11-10 DIAGNOSIS — F411 Generalized anxiety disorder: Secondary | ICD-10-CM | POA: Diagnosis not present

## 2020-11-10 DIAGNOSIS — F324 Major depressive disorder, single episode, in partial remission: Secondary | ICD-10-CM | POA: Diagnosis not present

## 2020-11-10 DIAGNOSIS — E785 Hyperlipidemia, unspecified: Secondary | ICD-10-CM | POA: Diagnosis not present

## 2020-11-10 DIAGNOSIS — E1142 Type 2 diabetes mellitus with diabetic polyneuropathy: Secondary | ICD-10-CM | POA: Diagnosis not present

## 2020-11-10 DIAGNOSIS — Z833 Family history of diabetes mellitus: Secondary | ICD-10-CM | POA: Diagnosis not present

## 2020-11-10 DIAGNOSIS — F17210 Nicotine dependence, cigarettes, uncomplicated: Secondary | ICD-10-CM | POA: Diagnosis not present

## 2020-11-10 DIAGNOSIS — Z882 Allergy status to sulfonamides status: Secondary | ICD-10-CM | POA: Diagnosis not present

## 2020-11-10 DIAGNOSIS — E1122 Type 2 diabetes mellitus with diabetic chronic kidney disease: Secondary | ICD-10-CM | POA: Diagnosis not present

## 2020-11-10 DIAGNOSIS — Z8673 Personal history of transient ischemic attack (TIA), and cerebral infarction without residual deficits: Secondary | ICD-10-CM | POA: Diagnosis not present

## 2020-11-10 DIAGNOSIS — J449 Chronic obstructive pulmonary disease, unspecified: Secondary | ICD-10-CM | POA: Diagnosis not present

## 2020-11-10 DIAGNOSIS — D6869 Other thrombophilia: Secondary | ICD-10-CM | POA: Diagnosis not present

## 2020-11-10 DIAGNOSIS — I4891 Unspecified atrial fibrillation: Secondary | ICD-10-CM | POA: Diagnosis not present

## 2020-11-11 DIAGNOSIS — M48061 Spinal stenosis, lumbar region without neurogenic claudication: Secondary | ICD-10-CM | POA: Diagnosis not present

## 2020-11-11 DIAGNOSIS — I700 Atherosclerosis of aorta: Secondary | ICD-10-CM | POA: Diagnosis not present

## 2020-11-11 DIAGNOSIS — D6869 Other thrombophilia: Secondary | ICD-10-CM | POA: Diagnosis not present

## 2020-11-11 DIAGNOSIS — I4891 Unspecified atrial fibrillation: Secondary | ICD-10-CM | POA: Diagnosis not present

## 2020-11-11 DIAGNOSIS — M5459 Other low back pain: Secondary | ICD-10-CM | POA: Diagnosis not present

## 2020-11-24 DIAGNOSIS — M810 Age-related osteoporosis without current pathological fracture: Secondary | ICD-10-CM | POA: Diagnosis not present

## 2020-11-27 DIAGNOSIS — M5451 Vertebrogenic low back pain: Secondary | ICD-10-CM | POA: Diagnosis not present

## 2020-12-07 DIAGNOSIS — M5416 Radiculopathy, lumbar region: Secondary | ICD-10-CM | POA: Diagnosis not present

## 2021-01-02 ENCOUNTER — Other Ambulatory Visit (HOSPITAL_COMMUNITY): Payer: Self-pay | Admitting: *Deleted

## 2021-01-03 ENCOUNTER — Encounter (HOSPITAL_COMMUNITY): Payer: Self-pay

## 2021-01-03 ENCOUNTER — Inpatient Hospital Stay (HOSPITAL_COMMUNITY): Admission: RE | Admit: 2021-01-03 | Payer: Medicare PPO | Source: Ambulatory Visit

## 2021-01-23 DIAGNOSIS — M48061 Spinal stenosis, lumbar region without neurogenic claudication: Secondary | ICD-10-CM | POA: Diagnosis not present

## 2021-01-27 ENCOUNTER — Other Ambulatory Visit: Payer: Self-pay

## 2021-01-27 ENCOUNTER — Emergency Department (HOSPITAL_COMMUNITY): Payer: Medicare PPO

## 2021-01-27 ENCOUNTER — Emergency Department (HOSPITAL_COMMUNITY)
Admission: EM | Admit: 2021-01-27 | Discharge: 2021-01-27 | Disposition: A | Discharge status: Home / Self Care | Payer: Medicare PPO | Attending: Emergency Medicine | Admitting: Emergency Medicine

## 2021-01-27 DIAGNOSIS — M4312 Spondylolisthesis, cervical region: Secondary | ICD-10-CM | POA: Diagnosis not present

## 2021-01-27 DIAGNOSIS — M47812 Spondylosis without myelopathy or radiculopathy, cervical region: Secondary | ICD-10-CM | POA: Diagnosis not present

## 2021-01-27 DIAGNOSIS — R29890 Loss of height: Secondary | ICD-10-CM | POA: Diagnosis not present

## 2021-01-27 DIAGNOSIS — R0602 Shortness of breath: Secondary | ICD-10-CM | POA: Diagnosis not present

## 2021-01-27 DIAGNOSIS — Z85828 Personal history of other malignant neoplasm of skin: Secondary | ICD-10-CM | POA: Diagnosis not present

## 2021-01-27 DIAGNOSIS — R42 Dizziness and giddiness: Secondary | ICD-10-CM | POA: Diagnosis not present

## 2021-01-27 DIAGNOSIS — E1140 Type 2 diabetes mellitus with diabetic neuropathy, unspecified: Secondary | ICD-10-CM | POA: Diagnosis not present

## 2021-01-27 DIAGNOSIS — R079 Chest pain, unspecified: Secondary | ICD-10-CM | POA: Insufficient documentation

## 2021-01-27 DIAGNOSIS — I10 Essential (primary) hypertension: Secondary | ICD-10-CM | POA: Insufficient documentation

## 2021-01-27 DIAGNOSIS — R002 Palpitations: Secondary | ICD-10-CM | POA: Insufficient documentation

## 2021-01-27 DIAGNOSIS — R269 Unspecified abnormalities of gait and mobility: Secondary | ICD-10-CM | POA: Diagnosis not present

## 2021-01-27 DIAGNOSIS — J449 Chronic obstructive pulmonary disease, unspecified: Secondary | ICD-10-CM | POA: Diagnosis not present

## 2021-01-27 DIAGNOSIS — S0990XA Unspecified injury of head, initial encounter: Secondary | ICD-10-CM | POA: Diagnosis not present

## 2021-01-27 DIAGNOSIS — R2689 Other abnormalities of gait and mobility: Secondary | ICD-10-CM | POA: Diagnosis not present

## 2021-01-27 DIAGNOSIS — I2510 Atherosclerotic heart disease of native coronary artery without angina pectoris: Secondary | ICD-10-CM | POA: Diagnosis not present

## 2021-01-27 DIAGNOSIS — M2578 Osteophyte, vertebrae: Secondary | ICD-10-CM | POA: Diagnosis not present

## 2021-01-27 DIAGNOSIS — R2681 Unsteadiness on feet: Secondary | ICD-10-CM

## 2021-01-27 DIAGNOSIS — F17210 Nicotine dependence, cigarettes, uncomplicated: Secondary | ICD-10-CM | POA: Insufficient documentation

## 2021-01-27 LAB — COMPREHENSIVE METABOLIC PANEL
ALT: 13 U/L (ref 0–44)
AST: 15 U/L (ref 15–41)
Albumin: 3.3 g/dL — ABNORMAL LOW (ref 3.5–5.0)
Alkaline Phosphatase: 64 U/L (ref 38–126)
Anion gap: 6 (ref 5–15)
BUN: 15 mg/dL (ref 8–23)
CO2: 28 mmol/L (ref 22–32)
Calcium: 8.6 mg/dL — ABNORMAL LOW (ref 8.9–10.3)
Chloride: 106 mmol/L (ref 98–111)
Creatinine, Ser: 0.63 mg/dL (ref 0.44–1.00)
GFR, Estimated: >60 mL/min (ref >60)
Glucose, Bld: 107 mg/dL — ABNORMAL HIGH (ref 70–99)
Potassium: 3.3 mmol/L — ABNORMAL LOW (ref 3.5–5.1)
Sodium: 140 mmol/L (ref 135–145)
Total Bilirubin: 0.8 mg/dL (ref 0.3–1.2)
Total Protein: 5.7 g/dL — ABNORMAL LOW (ref 6.5–8.1)

## 2021-01-27 LAB — CBC
HCT: 45.1 % (ref 36.0–46.0)
Hemoglobin: 15.1 g/dL — ABNORMAL HIGH (ref 12.0–15.0)
MCH: 31.7 pg (ref 26.0–34.0)
MCHC: 33.5 g/dL (ref 30.0–36.0)
MCV: 94.5 fL (ref 80.0–100.0)
Platelets: 185 10*3/uL (ref 150–400)
RBC: 4.77 MIL/uL (ref 3.87–5.11)
RDW: 12.5 % (ref 11.5–15.5)
WBC: 5.9 10*3/uL (ref 4.0–10.5)
nRBC: 0 % (ref 0.0–0.2)

## 2021-01-27 LAB — AMMONIA: Ammonia: 17 umol/L (ref 9–35)

## 2021-01-27 LAB — BRAIN NATRIURETIC PEPTIDE: B Natriuretic Peptide: 189.4 pg/mL — ABNORMAL HIGH (ref 0.0–100.0)

## 2021-01-27 LAB — TROPONIN I (HIGH SENSITIVITY)
Troponin I (High Sensitivity): 16 ng/L (ref <18)
Troponin I (High Sensitivity): 18 ng/L — ABNORMAL HIGH (ref <18)

## 2021-01-27 NOTE — ED Provider Notes (Signed)
Emergency Department Provider Note   I have reviewed the triage vital signs and the nursing notes.   HISTORY  Chief Complaint Chest Pain and Shortness of Breath   HPI Melanie Lucas is a 70 y.o. female with PMH of lymphedema, DM, HLD, HTN, and prior CVA with PAF on Eliquis presents to the emergency department with gait instability and heart palpitations.  Patient awoke with symptoms this morning.  Last normal was last night.  She tells me that she is followed by neurology with chronic gait instability and falls.  Her most recent fall was 4 days ago.  She states that she has canes and walkers at her house but does not use them.  She tells me that she has had MRIs this year but no clear source of her symptoms have been found.  In chart review, it looks as if the patient is followed by [facility] neurology with last MRI earlier this year in March along with an EEG at that time which was also negative.  She tells me this morning she felt very off balance without vertigo.  No numbness or unilateral weakness.  She had heart palpitations without pressure/tightness in her chest.  She is not feeling short of breath.  She denies any fevers.  She been compliant with her home medications but notes that she has been out of Lasix for "a while."  She describes some chronic abdominal pain which is no better or no worse.  She notes that a friend gave her some tizanidine 2 mg tabs she began taking 2 days ago. No radiation of symptoms or modifying factors.   Past Medical History:  Diagnosis Date   Anxiety    Cancer ([place])    Skin- leg    Chronic acquired lymphedema    a. R>L;  b. 03/2012 Neg LE U/S for DVT. Legs since age 30   Complication of anesthesia    woke up during colonoscopy, Lithrostrixpy, Biospy   COPD (chronic obstructive pulmonary disease) ([place])    Coronary artery disease    CVD (cardiovascular disease)    Deaf, right    Diabetes mellitus    Type II   Diabetic neuropathy ([place])    Dysrhythmia     afib   Elevated TSH    a. 10/2012 - inst to f/u PCP.   Full dentures    Hematuria    a. while on pradaxa,  she reports that she has seen Dr Margie Ege and had low risk cystoscopy   History of kidney stones    Hyperlipidemia    Hypertension    patient denies   Incontinence    prior to urinating   Lacunar infarction [facility])    a. 02/2009 non-acute Lacunar infarct of the right thalamus noted on MRI of brain.   Left carotid stenosis    Lymphedema of extremity    had this problem, since she was a teenager. especially seen in R LE   Meniere disease    Myocardial infarction [facility])    Obesity    Osteoarthritis    cervical & lumbar region, knees, hands cramp also    PAF with post-termination pauses    a. on dronedarone;  b. CHA2DS2VASc = 5 (HTN, DM, h/o lacunar infarct on MRI, Female) ->refused oral anticoagulation after h/o hematuria on pradaxa;  c. 02/2012 Echo: EF 55-60%, mildly dil LA. D. Recurrent PAF 10/2012 after only taking Multaq 1x/day - spont conv to NSR, placed back on BID Multaq/eliquis;  e. 10/2014 Multaq  d/c'd->tikosyn initiated.   Shortness of breath dyspnea    with exertion   Stroke ([place])    2 mini strokes   Tobacco abuse    Vision abnormalities     Patient Active Problem List   Diagnosis Date Noted   Stroke-like episode 83/38/2505   Acute metabolic encephalopathy 39/76/7341   Depression 12/31/2017   Stroke ([place]) 12/31/2017   COPD (chronic obstructive pulmonary disease) ([place]) 12/30/2017   Left carotid artery stenosis 07/29/2017   TIA (transient ischemic attack) 06/24/2017   Ischemic stroke ([place]) 06/23/2017   Cervical spondylosis with myelopathy 02/21/2016   Bilateral leg edema 11/24/2014   Cervical stenosis of spine 11/24/2014   Numbness of fingers of both hands 11/24/2014   HLD (hyperlipidemia) 11/09/2014   Diabetes mellitus without complication ( [place]) 93/79/0240   Pulmonary hypertension ([place]) 08/07/2013   CAD (coronary artery disease) 03/11/2013   Peripheral edema  02/25/2012   Essential hypertension, benign 05/06/2009   Overweight 04/13/2009   TOBACCO ABUSE 04/13/2009   Paroxysmal atrial fibrillation ([place]) 04/13/2009   CHEST PAIN, ATYPICAL 04/13/2009    Past Surgical History:  Procedure Laterality Date   ANTERIOR CERVICAL CORPECTOMY N/A 02/21/2016   Procedure: ANTERIOR CERVICAL CORPECTOMY ANS FUSION CERVICAL SIX , ANTERIOR PLATING CERVICAL FIVE-SEVEN;  Surgeon: Consuella Lose, MD;  Location: [place];  Service: Neurosurgery;  Laterality: N/A;   ANTERIOR FUSION CERVICAL SPINE  02/21/2016   Biopsy of adrenal glands     BREAST BIOPSY Bilateral    benign results, both breasts    CARDIAC CATHETERIZATION  2014   x1 stent placed, done in [place]    CARDIOVERSION N/A 11/11/2014   Procedure: CARDIOVERSION;  Surgeon: Larey Dresser, MD;  Location: [place];  Service: Cardiovascular;  Laterality: N/A;   CAROTID ENDARTERECTOMY Left 07/29/2017   COLONOSCOPY W/ POLYPECTOMY     CORONARY STENT PLACEMENT     ENDARTERECTOMY Left 07/29/2017   Procedure: ENDARTERECTOMY CAROTID LEFT;  Surgeon: Rosetta Posner, MD;  Location: [place];  Service: Vascular;  Laterality: Left;   PATCH ANGIOPLASTY Left 07/29/2017   Procedure: PATCH ANGIOPLASTY USING HEMASHIELD GOLD VASCULAR PATCH;  Surgeon: Rosetta Posner, MD;  Location: [place];  Service: Vascular;  Laterality: Left;   TEE WITHOUT CARDIOVERSION N/A 11/11/2014   Procedure: TRANSESOPHAGEAL ECHOCARDIOGRAM (TEE);  Surgeon: Larey Dresser, MD;  Location: [facility] END[facility]COPY;  Service: Cardiovascular;  Laterality: N/A;    Allergies Avelox [moxifloxacin], Chantix [varenicline], Fosamax [alendronate], Pradaxa [dabigatran etexilate mesylate], Statins, Sulfamethoxazole, Sulfonamide derivatives, Ciprofloxacin, Gabapentin, and Tape  Family History  Problem Relation Age of Onset   Heart attack Father    Coronary artery disease Father        strong family hx   Bladder Cancer Mother        bladder   Leukemia Brother    Breast cancer  Maternal Aunt    Breast cancer Paternal Aunt    Breast cancer Maternal Aunt     Social History Social History   Tobacco Use   Smoking status: Every Day    Packs/day: 1.00    Years: 55.00    Pack years: 55.00    Types: Cigarettes   Smokeless tobacco: Never   Tobacco comments:    1 pack daily 08/15/2020  Vaping Use   Vaping Use: Never used  Substance Use Topics   Alcohol use: Yes    Alcohol/week: 1.0 standard drink    Types: 1 Glasses of wine per week   Drug use: No    Review of  Systems  Constitutional: No fever/chills Eyes: No visual changes. ENT: No sore throat. Cardiovascular: Denies chest pain. Positive palpitations.  Respiratory: Denies shortness of breath. Gastrointestinal: No abdominal pain.  No nausea, no vomiting.  No diarrhea.  No constipation. Genitourinary: Negative for dysuria. Musculoskeletal: Negative for back pain. Skin: Negative for rash. Neurological: Negative for headaches, focal weakness or numbness. Positive acute on chronic gait instability.   10-point ROS otherwise negative.  ____________________________________________   PHYSICAL EXAM:  VITAL SIGNS: ED Triage Vitals  Enc Vitals Group     BP 01/27/21 1403 (!) 145/129     Pulse Rate 01/27/21 1403 62     Resp 01/27/21 1403 18     Temp 01/27/21 1403 98.2 F (36.8 C)     Temp Source 01/27/21 1403 Oral     SpO2 01/27/21 1403 100 %   Constitutional: Alert and oriented. Well appearing and in no acute distress. Eyes: Conjunctivae are normal. PERRL. EOMI. No Nystagmus.  Head: Atraumatic. Nose: No congestion/rhinnorhea. Mouth/Throat: Mucous membranes are moist.  Neck: No stridor.  Cardiovascular: Normal rate, regular rhythm. Good peripheral circulation. Grossly normal heart sounds.   Respiratory: Normal respiratory effort.  No retractions. Lungs CTAB. Gastrointestinal: Soft and nontender. No distention.  Musculoskeletal: No lower extremity tenderness with bilateral 2 + pitting edema. No  gross deformities of extremities. Neurologic:  Normal speech and language. No gross focal neurologic deficits are appreciated. No facial asymmetry. 5/5 strength in the bilateral upper/lower extremities.  Skin:  Skin is warm, dry and intact. No rash noted.   ____________________________________________   LABS (all labs ordered are listed, but only abnormal results are displayed)  Labs Reviewed  CBC - Abnormal; Notable for the following components:      Result Value   Hemoglobin 15.1 (*)    All other components within normal limits  BRAIN NATRIURETIC PEPTIDE - Abnormal; Notable for the following components:   B Natriuretic Peptide 189.4 (*)    All other components within normal limits  COMPREHENSIVE METABOLIC PANEL - Abnormal; Notable for the following components:   Potassium 3.3 (*)    Glucose, Bld 107 (*)    Calcium 8.6 (*)    Total Protein 5.7 (*)    Albumin 3.3 (*)    All other components within normal limits  TROPONIN I (HIGH SENSITIVITY) - Abnormal; Notable for the following components:   Troponin I (High Sensitivity) 18 (*)    All other components within normal limits  AMMONIA  TROPONIN I (HIGH SENSITIVITY)   ____________________________________________  EKG   EKG Interpretation  Date/Time:  Friday January 27 2021 14:04:15 EST Ventricular Rate:  59 PR Interval:    QRS Duration: 120 QT Interval:  466 QTC Calculation: 461 R Axis:   -79 Text Interpretation: Undetermined rhythm Right bundle branch block Left anterior fascicular block ** Bifascicular block ** Minimal voltage criteria for LVH, may be normal variant ( R in aVL ) Septal infarct , age undetermined Abnormal ECG Similar to April 2022 tracing Confirmed by Nanda Quinton [phone]) on 01/27/2021 4:05:56 PM        ____________________________________________  RADIOLOGY  DG Chest 2 View  Result Date: 01/27/2021 CLINICAL DATA:  Shortness of breath and chest pain. EXAM: CHEST - 2 VIEW COMPARISON:  05/25/2020  FINDINGS: The lungs are clear without focal pneumonia, edema, pneumothorax or pleural effusion. The cardiopericardial silhouette is within normal limits for size. Rim calcified left breast lesion evident, as demonstrated on CT scan of 08/15/2020. The visualized bony structures of the thorax show  no acute abnormality. Thoracolumbar scoliosis evident. IMPRESSION: No active cardiopulmonary disease. Electronically Signed   By: Misty Stanley M.D.   On: 01/27/2021 15:42   CT Head Wo Contrast  Result Date: 01/27/2021 CLINICAL DATA:  Trauma EXAM: CT HEAD WITHOUT CONTRAST TECHNIQUE: Contiguous axial images were obtained from the base of the skull through the vertex without intravenous contrast. COMPARISON:  12/07/2019 FINDINGS: Brain: No acute intracranial findings are seen. There is decrease in density in the subcortical and periventricular white matter in both cerebral hemispheres. Cortical sulci are slightly prominent. Vascular: Unremarkable. Skull: Unremarkable. Sinuses/Orbits: Unremarkable. Other: No significant interval changes are noted IMPRESSION: No acute intracranial findings are seen in noncontrast CT brain. Atrophy. Decreased density in the subcortical and periventricular white matter suggests nonspecific small-vessel disease or demyelination. Electronically Signed   By: Elmer Picker M.D.   On: 01/27/2021 16:16   CT Cervical Spine Wo Contrast  Result Date: 01/27/2021 CLINICAL DATA:  Provided history: Neck trauma. Additional history provided: Patient reports balance difficulty for 6 months, multiple falls. EXAM: CT CERVICAL SPINE WITHOUT CONTRAST TECHNIQUE: Multidetector CT imaging of the cervical spine was performed without intravenous contrast. Multiplanar CT image reconstructions were also generated. COMPARISON:  Cervical spine MRI 01/21/2018. FINDINGS: Alignment: Straightening of the expected cervical lordosis. Cervical levocurvature with partially imaged thoracic dextrocurvature. Trace C2-C3,  C3-C4 and C4-C5 grade 1 anterolisthesis. Skull base and vertebrae: The basion-dental and atlanto-dental intervals are maintained.No evidence of acute fracture to the cervical spine. Redemonstrated sequela of prior C6 corpectomy and C5-C7 ACDF. No evidence of acute hardware compromise. A T2 superior endplate deformity with Schmorl node is new as compared to the cervical spine MRI of 01/21/2018, but otherwise age-indeterminate. There is approximately 30% vertebral body height loss at this level. Soft tissues and spinal canal: No prevertebral fluid or swelling. No visible canal hematoma. Disc levels: Cervical spondylosis with multilevel disc space narrowing and disc bulges. There is a prominent posterior disc osteophyte complex at C6-C7 resulting in at least moderate spinal canal stenosis. Multilevel bony neural foraminal narrowing. Upper chest: No consolidation within the imaged lung apices. No visible pneumothorax. IMPRESSION: 1. No evidence of acute fracture to the cervical spine. 2. There is a T2 vertebral superior endplate deformity (~74% height loss) with superimposed Schmorl node. This is new as compared to the prior cervical spine MRI of 01/21/2018, but otherwise age-indeterminate. 3. Straightening of the expected cervical lordosis. 4. Cervical levocurvature with partially imaged thoracic dextrocurvature. 5. Trace grade 1 anterolisthesis at C2-C3, C3-C4 and C4-C5. 6. Redemonstrated sequela of prior C6 corpectomy and C5-C7 ACDF. No evidence of acute hardware compromise. 7. Cervical spondylosis. Most notably, a prominent posterior disc osteophyte complex at C6-C7 contributes to at least moderate spinal canal stenosis. Multilevel bony neural foraminal narrowing. Electronically Signed   By: Kellie Simmering D.O.   On: 01/27/2021 16:31    ____________________________________________   PROCEDURES  Procedure(s) performed:   Procedures  None  ____________________________________________   INITIAL IMPRESSION  / ASSESSMENT AND PLAN / ED COURSE  Pertinent labs & imaging results that were available during my care of the patient were reviewed by me and considered in my medical decision making (see chart for details).   Patient presents emergency department with gait instability worsening this morning but appears to be an ongoing/chronic issue.  She is followed by neurology is performed MRI and EEG this year with no acute findings.  I do not appreciate any focal neurologic deficits on my exam but question some CT findings including possible  demyelinating disease.  Do plan for repeat MRI here to further evaluate this along with potential for intervention with last normal noted to be yesterday. No focal neuro deficits on my exam.  Could be related to recent medication which she has been taking from a friend, tizanidine.  Her EKG shows no acute ischemic changes similar to prior values.  Troponin is 16 which is within normal limits.  BNP mildly elevated at 189.  She does have lymphedema changes on exam but no pulmonary edema or infiltrate on chest x-ray.  Ammonia and CMP are pending.  We will follow these additional labs along with MRI and reassess.  Lab work here is reassuring.  Patient has normal kidney function and only mild decreased potassium.  Troponin is flat.  Ammonia normal.  CT imaging of the head and neck reviewed ordered from triage.  Chest x-ray is clear.  Gait instability seems more chronic but patient insisting that symptoms worsened today.  MRI obtained looking for stroke but MRI shows no acute process.  She has established care with her PCP and neurology.  Will list contact information for cardiology given her palpitations earlier this morning but no return of symptoms or abnormal telemetry here in the emergency department. Patient appears stable for discharge. Discussed follow up plan and return precautions. Patient is eating here and up/ambulatory with a steady gait.   ____________________________________________  FINAL CLINICAL IMPRESSION(S) / ED DIAGNOSES  Final diagnoses:  Gait instability  Palpitations    Note:  This document was prepared using Dragon voice recognition software and may include unintentional dictation errors.  Nanda Quinton, MD, [facility] Emergency Medicine    Azzam Mehra, [facility], MD 01/27/21 2035

## 2021-01-27 NOTE — ED Provider Notes (Signed)
Emergency Medicine Provider Triage Evaluation Note  Melanie Lucas , a 70 y.o. female  was evaluated in triage.  Pt complains of SOB for the past 4-5 days along with nausea. Denies any chest pain, but reports a "jittery" feeling. Reports some lightheadedness for the past 6 months. She reports that she has had multiple falls with the last one being 4 days ago. Reports that she hit her head. LOC "a couple of times" over the past year.   Review of Systems  Positive: SOB, LOC, syncope Negative: Chest pain  Physical Exam  BP (!) 145/129 (BP Location: Left Arm)   Pulse 62   Temp 98.2 F (36.8 C) (Oral)   Resp 18   SpO2 100%  Gen:   Awake, no distress   Resp:  Normal effort  MSK:   Moves extremities without difficulty, pitting edema to bilateral lower extremities Other:  Abdomen soft  Medical Decision Making  Medically screening exam initiated at 3:03 PM.  Appropriate orders placed.  Melanie Lucas was informed that the remainder of the evaluation will be completed by another provider, this initial triage assessment does not replace that evaluation, and the importance of remaining in the ED until their evaluation is complete.  Labs and imaging placed.   Sherrell Puller, PA-C 01/27/21 1512    Tegeler, Gwenyth Allegra, MD 01/27/21 2245

## 2021-01-27 NOTE — ED Triage Notes (Addendum)
Pt here from home for fluttering in chest w/ shob, and per pt she was running into things. Pt states this morning she felt like she was dying. Pt saw PCP this morning who sent her to ER for further cp work up. Pt reports having balance issues for 6 months, holds onto walls to walk around, has had multiple falls.

## 2021-01-27 NOTE — ED Notes (Signed)
Pt's daughter, Heather Roberts, brought pt here, had to leave. Phone number is in chart

## 2021-01-27 NOTE — ED Notes (Signed)
Patient transported to MRI 

## 2021-01-27 NOTE — Discharge Instructions (Addendum)
You were seen in the ED today with gait instability and and racing heartbeat. Your MRI did not show a stroke.  Please follow close with your primary care doctor and your neurology team.  I have listed the name for a heart doctor on this form.  Please call for the next available appointment.  Return to the emergency department any new or suddenly worsening symptoms.

## 2021-01-27 NOTE — ED Notes (Signed)
RN reviewed discharge instructions with pt. Pt verbalized understanding and has no further questions. VSS upon discharge. Pt unable to get in contact with daughter to pick her up. Pt to wait in lobby for daughter.

## 2021-01-31 DIAGNOSIS — M48061 Spinal stenosis, lumbar region without neurogenic claudication: Secondary | ICD-10-CM | POA: Diagnosis not present

## 2021-02-28 DIAGNOSIS — E1159 Type 2 diabetes mellitus with other circulatory complications: Secondary | ICD-10-CM | POA: Diagnosis not present

## 2021-02-28 DIAGNOSIS — E1140 Type 2 diabetes mellitus with diabetic neuropathy, unspecified: Secondary | ICD-10-CM | POA: Diagnosis not present

## 2021-02-28 DIAGNOSIS — E785 Hyperlipidemia, unspecified: Secondary | ICD-10-CM | POA: Diagnosis not present

## 2021-02-28 DIAGNOSIS — D6859 Other primary thrombophilia: Secondary | ICD-10-CM | POA: Diagnosis not present

## 2021-02-28 DIAGNOSIS — E1151 Type 2 diabetes mellitus with diabetic peripheral angiopathy without gangrene: Secondary | ICD-10-CM | POA: Diagnosis not present

## 2021-02-28 DIAGNOSIS — E663 Overweight: Secondary | ICD-10-CM | POA: Diagnosis not present

## 2021-02-28 DIAGNOSIS — D6869 Other thrombophilia: Secondary | ICD-10-CM | POA: Diagnosis not present

## 2021-02-28 DIAGNOSIS — F319 Bipolar disorder, unspecified: Secondary | ICD-10-CM | POA: Diagnosis not present

## 2021-02-28 DIAGNOSIS — Z9181 History of falling: Secondary | ICD-10-CM | POA: Diagnosis not present

## 2021-02-28 DIAGNOSIS — F17210 Nicotine dependence, cigarettes, uncomplicated: Secondary | ICD-10-CM | POA: Diagnosis not present

## 2021-02-28 DIAGNOSIS — Z8673 Personal history of transient ischemic attack (TIA), and cerebral infarction without residual deficits: Secondary | ICD-10-CM | POA: Diagnosis not present

## 2021-03-06 ENCOUNTER — Other Ambulatory Visit: Payer: Self-pay

## 2021-03-06 ENCOUNTER — Inpatient Hospital Stay (HOSPITAL_BASED_OUTPATIENT_CLINIC_OR_DEPARTMENT_OTHER)
Admission: EM | Admit: 2021-03-06 | Discharge: 2021-03-10 | DRG: 064 | Disposition: A | Discharge status: Skilled Nursing Facility | Payer: Medicare PPO | Attending: Internal Medicine | Admitting: Internal Medicine

## 2021-03-06 ENCOUNTER — Encounter (HOSPITAL_BASED_OUTPATIENT_CLINIC_OR_DEPARTMENT_OTHER): Payer: Self-pay | Admitting: Emergency Medicine

## 2021-03-06 ENCOUNTER — Emergency Department (HOSPITAL_BASED_OUTPATIENT_CLINIC_OR_DEPARTMENT_OTHER): Payer: Medicare PPO

## 2021-03-06 DIAGNOSIS — E119 Type 2 diabetes mellitus without complications: Secondary | ICD-10-CM | POA: Diagnosis not present

## 2021-03-06 DIAGNOSIS — R0789 Other chest pain: Secondary | ICD-10-CM | POA: Diagnosis not present

## 2021-03-06 DIAGNOSIS — G9341 Metabolic encephalopathy: Secondary | ICD-10-CM | POA: Diagnosis present

## 2021-03-06 DIAGNOSIS — Z8673 Personal history of transient ischemic attack (TIA), and cerebral infarction without residual deficits: Secondary | ICD-10-CM | POA: Diagnosis not present

## 2021-03-06 DIAGNOSIS — I480 Paroxysmal atrial fibrillation: Secondary | ICD-10-CM | POA: Diagnosis present

## 2021-03-06 DIAGNOSIS — M419 Scoliosis, unspecified: Secondary | ICD-10-CM | POA: Diagnosis not present

## 2021-03-06 DIAGNOSIS — I5032 Chronic diastolic (congestive) heart failure: Secondary | ICD-10-CM | POA: Diagnosis present

## 2021-03-06 DIAGNOSIS — J449 Chronic obstructive pulmonary disease, unspecified: Secondary | ICD-10-CM | POA: Diagnosis present

## 2021-03-06 DIAGNOSIS — R29702 NIHSS score 2: Secondary | ICD-10-CM | POA: Diagnosis present

## 2021-03-06 DIAGNOSIS — F17210 Nicotine dependence, cigarettes, uncomplicated: Secondary | ICD-10-CM | POA: Diagnosis present

## 2021-03-06 DIAGNOSIS — S3991XA Unspecified injury of abdomen, initial encounter: Secondary | ICD-10-CM | POA: Diagnosis not present

## 2021-03-06 DIAGNOSIS — I10 Essential (primary) hypertension: Secondary | ICD-10-CM | POA: Diagnosis present

## 2021-03-06 DIAGNOSIS — E1169 Type 2 diabetes mellitus with other specified complication: Secondary | ICD-10-CM | POA: Diagnosis present

## 2021-03-06 DIAGNOSIS — R41841 Cognitive communication deficit: Secondary | ICD-10-CM | POA: Diagnosis not present

## 2021-03-06 DIAGNOSIS — Z981 Arthrodesis status: Secondary | ICD-10-CM | POA: Diagnosis not present

## 2021-03-06 DIAGNOSIS — R2681 Unsteadiness on feet: Secondary | ICD-10-CM | POA: Diagnosis not present

## 2021-03-06 DIAGNOSIS — G319 Degenerative disease of nervous system, unspecified: Secondary | ICD-10-CM | POA: Diagnosis not present

## 2021-03-06 DIAGNOSIS — H53462 Homonymous bilateral field defects, left side: Secondary | ICD-10-CM | POA: Diagnosis present

## 2021-03-06 DIAGNOSIS — E876 Hypokalemia: Secondary | ICD-10-CM | POA: Diagnosis present

## 2021-03-06 DIAGNOSIS — I252 Old myocardial infarction: Secondary | ICD-10-CM | POA: Diagnosis not present

## 2021-03-06 DIAGNOSIS — I6340 Cerebral infarction due to embolism of unspecified cerebral artery: Principal | ICD-10-CM | POA: Diagnosis present

## 2021-03-06 DIAGNOSIS — Z20822 Contact with and (suspected) exposure to covid-19: Secondary | ICD-10-CM | POA: Diagnosis present

## 2021-03-06 DIAGNOSIS — I639 Cerebral infarction, unspecified: Secondary | ICD-10-CM | POA: Diagnosis present

## 2021-03-06 DIAGNOSIS — Z743 Need for continuous supervision: Secondary | ICD-10-CM | POA: Diagnosis not present

## 2021-03-06 DIAGNOSIS — H9191 Unspecified hearing loss, right ear: Secondary | ICD-10-CM | POA: Diagnosis present

## 2021-03-06 DIAGNOSIS — Z8052 Family history of malignant neoplasm of bladder: Secondary | ICD-10-CM

## 2021-03-06 DIAGNOSIS — F17200 Nicotine dependence, unspecified, uncomplicated: Secondary | ICD-10-CM | POA: Diagnosis not present

## 2021-03-06 DIAGNOSIS — R079 Chest pain, unspecified: Secondary | ICD-10-CM

## 2021-03-06 DIAGNOSIS — I6389 Other cerebral infarction: Secondary | ICD-10-CM | POA: Diagnosis not present

## 2021-03-06 DIAGNOSIS — S3993XA Unspecified injury of pelvis, initial encounter: Secondary | ICD-10-CM | POA: Diagnosis not present

## 2021-03-06 DIAGNOSIS — Z7901 Long term (current) use of anticoagulants: Secondary | ICD-10-CM

## 2021-03-06 DIAGNOSIS — Z806 Family history of leukemia: Secondary | ICD-10-CM | POA: Diagnosis not present

## 2021-03-06 DIAGNOSIS — I6381 Other cerebral infarction due to occlusion or stenosis of small artery: Secondary | ICD-10-CM | POA: Diagnosis not present

## 2021-03-06 DIAGNOSIS — S2220XA Unspecified fracture of sternum, initial encounter for closed fracture: Secondary | ICD-10-CM | POA: Diagnosis present

## 2021-03-06 DIAGNOSIS — Z803 Family history of malignant neoplasm of breast: Secondary | ICD-10-CM

## 2021-03-06 DIAGNOSIS — I4891 Unspecified atrial fibrillation: Secondary | ICD-10-CM | POA: Diagnosis present

## 2021-03-06 DIAGNOSIS — M6259 Muscle wasting and atrophy, not elsewhere classified, multiple sites: Secondary | ICD-10-CM | POA: Diagnosis not present

## 2021-03-06 DIAGNOSIS — Z79899 Other long term (current) drug therapy: Secondary | ICD-10-CM

## 2021-03-06 DIAGNOSIS — R531 Weakness: Secondary | ICD-10-CM | POA: Diagnosis not present

## 2021-03-06 DIAGNOSIS — I110 Hypertensive heart disease with heart failure: Secondary | ICD-10-CM | POA: Diagnosis present

## 2021-03-06 DIAGNOSIS — I5033 Acute on chronic diastolic (congestive) heart failure: Secondary | ICD-10-CM | POA: Diagnosis present

## 2021-03-06 DIAGNOSIS — S2220XD Unspecified fracture of sternum, subsequent encounter for fracture with routine healing: Secondary | ICD-10-CM | POA: Diagnosis not present

## 2021-03-06 DIAGNOSIS — N289 Disorder of kidney and ureter, unspecified: Secondary | ICD-10-CM | POA: Diagnosis present

## 2021-03-06 DIAGNOSIS — I63233 Cerebral infarction due to unspecified occlusion or stenosis of bilateral carotid arteries: Secondary | ICD-10-CM | POA: Diagnosis not present

## 2021-03-06 DIAGNOSIS — S299XXA Unspecified injury of thorax, initial encounter: Secondary | ICD-10-CM | POA: Diagnosis not present

## 2021-03-06 DIAGNOSIS — Z8249 Family history of ischemic heart disease and other diseases of the circulatory system: Secondary | ICD-10-CM

## 2021-03-06 DIAGNOSIS — E1165 Type 2 diabetes mellitus with hyperglycemia: Secondary | ICD-10-CM | POA: Diagnosis present

## 2021-03-06 DIAGNOSIS — M2578 Osteophyte, vertebrae: Secondary | ICD-10-CM | POA: Diagnosis not present

## 2021-03-06 DIAGNOSIS — Z7984 Long term (current) use of oral hypoglycemic drugs: Secondary | ICD-10-CM | POA: Diagnosis not present

## 2021-03-06 DIAGNOSIS — G9389 Other specified disorders of brain: Secondary | ICD-10-CM | POA: Diagnosis not present

## 2021-03-06 DIAGNOSIS — I2510 Atherosclerotic heart disease of native coronary artery without angina pectoris: Secondary | ICD-10-CM | POA: Diagnosis present

## 2021-03-06 DIAGNOSIS — E279 Disorder of adrenal gland, unspecified: Secondary | ICD-10-CM | POA: Diagnosis present

## 2021-03-06 DIAGNOSIS — E785 Hyperlipidemia, unspecified: Secondary | ICD-10-CM | POA: Diagnosis present

## 2021-03-06 DIAGNOSIS — R4182 Altered mental status, unspecified: Secondary | ICD-10-CM | POA: Diagnosis not present

## 2021-03-06 DIAGNOSIS — M6281 Muscle weakness (generalized): Secondary | ICD-10-CM | POA: Diagnosis not present

## 2021-03-06 LAB — URINALYSIS, ROUTINE W REFLEX MICROSCOPIC
Bilirubin Urine: NEGATIVE
Glucose, UA: >1000 mg/dL — AB
Hgb urine dipstick: NEGATIVE
Ketones, ur: 15 mg/dL — AB
Leukocytes,Ua: NEGATIVE
Nitrite: NEGATIVE
Protein, ur: NEGATIVE mg/dL
Specific Gravity, Urine: 1.041 — ABNORMAL HIGH (ref 1.005–1.030)
pH: 7.5 (ref 5.0–8.0)

## 2021-03-06 LAB — CBC
HCT: 48 % — ABNORMAL HIGH (ref 36.0–46.0)
Hemoglobin: 16.5 g/dL — ABNORMAL HIGH (ref 12.0–15.0)
MCH: 31.7 pg (ref 26.0–34.0)
MCHC: 34.4 g/dL (ref 30.0–36.0)
MCV: 92.3 fL (ref 80.0–100.0)
Platelets: 190 10*3/uL (ref 150–400)
RBC: 5.2 MIL/uL — ABNORMAL HIGH (ref 3.87–5.11)
RDW: 12.7 % (ref 11.5–15.5)
WBC: 7.8 10*3/uL (ref 4.0–10.5)
nRBC: 0 % (ref 0.0–0.2)

## 2021-03-06 LAB — COMPREHENSIVE METABOLIC PANEL
ALT: 14 U/L (ref 0–44)
AST: 13 U/L — ABNORMAL LOW (ref 15–41)
Albumin: 4.1 g/dL (ref 3.5–5.0)
Alkaline Phosphatase: 62 U/L (ref 38–126)
Anion gap: 9 (ref 5–15)
BUN: 14 mg/dL (ref 8–23)
CO2: 30 mmol/L (ref 22–32)
Calcium: 9.3 mg/dL (ref 8.9–10.3)
Chloride: 103 mmol/L (ref 98–111)
Creatinine, Ser: 0.62 mg/dL (ref 0.44–1.00)
GFR, Estimated: >60 mL/min (ref >60)
Glucose, Bld: 133 mg/dL — ABNORMAL HIGH (ref 70–99)
Potassium: 3.1 mmol/L — ABNORMAL LOW (ref 3.5–5.1)
Sodium: 142 mmol/L (ref 135–145)
Total Bilirubin: 0.7 mg/dL (ref 0.3–1.2)
Total Protein: 6.5 g/dL (ref 6.5–8.1)

## 2021-03-06 LAB — RESP PANEL BY RT-PCR (FLU A&B, COVID) ARPGX2
Influenza A by PCR: NEGATIVE
Influenza B by PCR: NEGATIVE
SARS Coronavirus 2 by RT PCR: NEGATIVE

## 2021-03-06 MED ORDER — IOHEXOL 300 MG/ML  SOLN
100.0000 mL | Freq: Once | INTRAMUSCULAR | Status: AC | PRN
  Administered 2021-03-06: 100 mL via INTRAVENOUS

## 2021-03-06 NOTE — ED Notes (Signed)
Patient transported to CT 

## 2021-03-06 NOTE — ED Notes (Signed)
Pt eating family-provided food
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
## 2021-03-06 NOTE — ED Notes (Signed)
Pt resting , readjusted pulse ox. Pt stated she was ok
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
## 2021-03-06 NOTE — ED Notes (Signed)
Pt's daughter advised that Pt has had bilateral edema on her feet since 45 yoa. Unable to obtain pedal pulses.

## 2021-03-07 ENCOUNTER — Observation Stay (HOSPITAL_COMMUNITY): Payer: Medicare PPO

## 2021-03-07 DIAGNOSIS — E119 Type 2 diabetes mellitus without complications: Secondary | ICD-10-CM

## 2021-03-07 DIAGNOSIS — H9191 Unspecified hearing loss, right ear: Secondary | ICD-10-CM | POA: Diagnosis present

## 2021-03-07 DIAGNOSIS — E279 Disorder of adrenal gland, unspecified: Secondary | ICD-10-CM | POA: Diagnosis present

## 2021-03-07 DIAGNOSIS — J449 Chronic obstructive pulmonary disease, unspecified: Secondary | ICD-10-CM | POA: Diagnosis present

## 2021-03-07 DIAGNOSIS — Z7901 Long term (current) use of anticoagulants: Secondary | ICD-10-CM | POA: Diagnosis not present

## 2021-03-07 DIAGNOSIS — S2220XA Unspecified fracture of sternum, initial encounter for closed fracture: Secondary | ICD-10-CM

## 2021-03-07 DIAGNOSIS — I6389 Other cerebral infarction: Secondary | ICD-10-CM

## 2021-03-07 DIAGNOSIS — E785 Hyperlipidemia, unspecified: Secondary | ICD-10-CM

## 2021-03-07 DIAGNOSIS — N289 Disorder of kidney and ureter, unspecified: Secondary | ICD-10-CM | POA: Diagnosis present

## 2021-03-07 DIAGNOSIS — F17200 Nicotine dependence, unspecified, uncomplicated: Secondary | ICD-10-CM

## 2021-03-07 DIAGNOSIS — I10 Essential (primary) hypertension: Secondary | ICD-10-CM | POA: Diagnosis not present

## 2021-03-07 DIAGNOSIS — Z981 Arthrodesis status: Secondary | ICD-10-CM | POA: Diagnosis not present

## 2021-03-07 DIAGNOSIS — H53462 Homonymous bilateral field defects, left side: Secondary | ICD-10-CM | POA: Diagnosis present

## 2021-03-07 DIAGNOSIS — E876 Hypokalemia: Secondary | ICD-10-CM

## 2021-03-07 DIAGNOSIS — I480 Paroxysmal atrial fibrillation: Secondary | ICD-10-CM

## 2021-03-07 DIAGNOSIS — R29702 NIHSS score 2: Secondary | ICD-10-CM | POA: Diagnosis present

## 2021-03-07 DIAGNOSIS — G9341 Metabolic encephalopathy: Secondary | ICD-10-CM | POA: Diagnosis present

## 2021-03-07 DIAGNOSIS — I110 Hypertensive heart disease with heart failure: Secondary | ICD-10-CM | POA: Diagnosis present

## 2021-03-07 DIAGNOSIS — Z806 Family history of leukemia: Secondary | ICD-10-CM | POA: Diagnosis not present

## 2021-03-07 DIAGNOSIS — I252 Old myocardial infarction: Secondary | ICD-10-CM | POA: Diagnosis not present

## 2021-03-07 DIAGNOSIS — I2510 Atherosclerotic heart disease of native coronary artery without angina pectoris: Secondary | ICD-10-CM | POA: Diagnosis present

## 2021-03-07 DIAGNOSIS — Z7984 Long term (current) use of oral hypoglycemic drugs: Secondary | ICD-10-CM | POA: Diagnosis not present

## 2021-03-07 DIAGNOSIS — I639 Cerebral infarction, unspecified: Secondary | ICD-10-CM | POA: Diagnosis not present

## 2021-03-07 DIAGNOSIS — I5032 Chronic diastolic (congestive) heart failure: Secondary | ICD-10-CM | POA: Diagnosis present

## 2021-03-07 DIAGNOSIS — Z8673 Personal history of transient ischemic attack (TIA), and cerebral infarction without residual deficits: Secondary | ICD-10-CM | POA: Diagnosis not present

## 2021-03-07 DIAGNOSIS — I6340 Cerebral infarction due to embolism of unspecified cerebral artery: Secondary | ICD-10-CM | POA: Diagnosis present

## 2021-03-07 DIAGNOSIS — E1165 Type 2 diabetes mellitus with hyperglycemia: Secondary | ICD-10-CM | POA: Diagnosis present

## 2021-03-07 DIAGNOSIS — F17210 Nicotine dependence, cigarettes, uncomplicated: Secondary | ICD-10-CM | POA: Diagnosis present

## 2021-03-07 DIAGNOSIS — Z20822 Contact with and (suspected) exposure to covid-19: Secondary | ICD-10-CM | POA: Diagnosis present

## 2021-03-07 LAB — BASIC METABOLIC PANEL
Anion gap: 9 (ref 5–15)
BUN: 13 mg/dL (ref 8–23)
CO2: 29 mmol/L (ref 22–32)
Calcium: 8.8 mg/dL — ABNORMAL LOW (ref 8.9–10.3)
Chloride: 103 mmol/L (ref 98–111)
Creatinine, Ser: 0.73 mg/dL (ref 0.44–1.00)
GFR, Estimated: >60 mL/min (ref >60)
Glucose, Bld: 133 mg/dL — ABNORMAL HIGH (ref 70–99)
Potassium: 3.4 mmol/L — ABNORMAL LOW (ref 3.5–5.1)
Sodium: 141 mmol/L (ref 135–145)

## 2021-03-07 LAB — ECHOCARDIOGRAM COMPLETE
AR max vel: 1.57 cm2
AV Area VTI: 1.58 cm2
AV Area mean vel: 1.47 cm2
AV Mean grad: 6 mmHg
AV Peak grad: 13.1 mmHg
Ao pk vel: 1.81 m/s
Area-P 1/2: 3.08 cm2
Height: 67 in
S' Lateral: 3.6 cm
Weight: 2560 oz

## 2021-03-07 LAB — GLUCOSE, CAPILLARY
Glucose-Capillary: 115 mg/dL — ABNORMAL HIGH (ref 70–99)
Glucose-Capillary: 267 mg/dL — ABNORMAL HIGH (ref 70–99)

## 2021-03-07 LAB — TROPONIN I (HIGH SENSITIVITY)
Troponin I (High Sensitivity): 25 ng/L — ABNORMAL HIGH (ref <18)
Troponin I (High Sensitivity): 26 ng/L — ABNORMAL HIGH (ref <18)

## 2021-03-07 LAB — LIPID PANEL
Cholesterol: 199 mg/dL (ref 0–200)
HDL: 57 mg/dL (ref >40)
LDL Cholesterol: 125 mg/dL — ABNORMAL HIGH (ref 0–99)
Total CHOL/HDL Ratio: 3.5 RATIO
Triglycerides: 83 mg/dL (ref <150)
VLDL: 17 mg/dL (ref 0–40)

## 2021-03-07 LAB — BRAIN NATRIURETIC PEPTIDE: B Natriuretic Peptide: 220.2 pg/mL — ABNORMAL HIGH (ref 0.0–100.0)

## 2021-03-07 LAB — HEMOGLOBIN A1C
Hgb A1c MFr Bld: 7.5 % — ABNORMAL HIGH (ref 4.8–5.6)
Mean Plasma Glucose: 168.55 mg/dL

## 2021-03-07 LAB — MAGNESIUM: Magnesium: 2 mg/dL (ref 1.7–2.4)

## 2021-03-07 LAB — AMMONIA: Ammonia: 59 umol/L — ABNORMAL HIGH (ref 9–35)

## 2021-03-07 LAB — TSH: TSH: 2.111 u[IU]/mL (ref 0.350–4.500)

## 2021-03-07 MED ORDER — POTASSIUM CHLORIDE CRYS ER 20 MEQ PO TBCR
40.0000 meq | EXTENDED_RELEASE_TABLET | ORAL | Status: AC
  Administered 2021-03-07: 40 meq via ORAL
  Filled 2021-03-07: qty 2

## 2021-03-07 MED ORDER — POTASSIUM CHLORIDE 10 MEQ/100ML IV SOLN
INTRAVENOUS | Status: AC
  Filled 2021-03-07: qty 100

## 2021-03-07 MED ORDER — DIAZEPAM 5 MG PO TABS
10.0000 mg | ORAL_TABLET | Freq: Every evening | ORAL | Status: DC | PRN
  Administered 2021-03-07 – 2021-03-08 (×2): 10 mg via ORAL
  Filled 2021-03-07 (×2): qty 2

## 2021-03-07 MED ORDER — BUTALBITAL-APAP-CAFFEINE 50-325-40 MG PO TABS
1.0000 | ORAL_TABLET | Freq: Four times a day (QID) | ORAL | Status: DC | PRN
  Filled 2021-03-07: qty 1

## 2021-03-07 MED ORDER — DULOXETINE HCL 60 MG PO CPEP
60.0000 mg | ORAL_CAPSULE | Freq: Every day | ORAL | Status: DC
  Administered 2021-03-07 – 2021-03-10 (×4): 60 mg via ORAL
  Filled 2021-03-07 (×4): qty 1

## 2021-03-07 MED ORDER — POTASSIUM CHLORIDE 10 MEQ/100ML IV SOLN
10.0000 meq | INTRAVENOUS | Status: AC
  Administered 2021-03-07 (×2): 10 meq via INTRAVENOUS

## 2021-03-07 MED ORDER — STROKE: EARLY STAGES OF RECOVERY BOOK
Freq: Once | Status: AC
  Filled 2021-03-07 (×2): qty 1

## 2021-03-07 MED ORDER — DICLOFENAC SODIUM 1 % EX GEL
2.0000 g | Freq: Two times a day (BID) | CUTANEOUS | Status: DC
  Administered 2021-03-07 – 2021-03-10 (×6): 2 g via TOPICAL
  Filled 2021-03-07: qty 100

## 2021-03-07 MED ORDER — TRIMETHOBENZAMIDE HCL 100 MG/ML IM SOLN
200.0000 mg | Freq: Four times a day (QID) | INTRAMUSCULAR | Status: DC | PRN
  Filled 2021-03-07: qty 2

## 2021-03-07 MED ORDER — ACETAMINOPHEN 650 MG RE SUPP: 650.0000 mg | RECTAL | Status: DC | PRN

## 2021-03-07 MED ORDER — APIXABAN 5 MG PO TABS
5.0000 mg | ORAL_TABLET | Freq: Two times a day (BID) | ORAL | Status: DC
  Administered 2021-03-07 (×2): 5 mg via ORAL
  Filled 2021-03-07 (×2): qty 1

## 2021-03-07 MED ORDER — FUROSEMIDE 10 MG/ML IJ SOLN
40.0000 mg | Freq: Once | INTRAMUSCULAR | Status: AC
  Administered 2021-03-07: 40 mg via INTRAVENOUS
  Filled 2021-03-07: qty 4

## 2021-03-07 MED ORDER — INSULIN ASPART 100 UNIT/ML IJ SOLN
0.0000 [IU] | Freq: Three times a day (TID) | INTRAMUSCULAR | Status: DC
  Administered 2021-03-08: 3 [IU] via SUBCUTANEOUS
  Administered 2021-03-08: 2 [IU] via SUBCUTANEOUS
  Administered 2021-03-08 – 2021-03-09 (×2): 5 [IU] via SUBCUTANEOUS
  Administered 2021-03-09: 3 [IU] via SUBCUTANEOUS
  Administered 2021-03-09: 8 [IU] via SUBCUTANEOUS
  Administered 2021-03-10: 3 [IU] via SUBCUTANEOUS
  Administered 2021-03-10: 2 [IU] via SUBCUTANEOUS
  Administered 2021-03-10: 3 [IU] via SUBCUTANEOUS

## 2021-03-07 MED ORDER — DOFETILIDE 500 MCG PO CAPS
500.0000 ug | ORAL_CAPSULE | Freq: Two times a day (BID) | ORAL | Status: DC
  Administered 2021-03-07 – 2021-03-10 (×7): 500 ug via ORAL
  Filled 2021-03-07 (×8): qty 1

## 2021-03-07 MED ORDER — ACETAMINOPHEN 325 MG PO TABS
650.0000 mg | ORAL_TABLET | ORAL | Status: DC | PRN
  Administered 2021-03-07 – 2021-03-08 (×3): 650 mg via ORAL
  Filled 2021-03-07 (×3): qty 2

## 2021-03-07 MED ORDER — LINACLOTIDE 145 MCG PO CAPS
290.0000 ug | ORAL_CAPSULE | Freq: Every day | ORAL | Status: DC
  Administered 2021-03-08 – 2021-03-10 (×3): 290 ug via ORAL
  Filled 2021-03-07 (×4): qty 2

## 2021-03-07 MED ORDER — ACETAMINOPHEN 160 MG/5ML PO SOLN: 650.0000 mg | ORAL | Status: DC | PRN

## 2021-03-07 NOTE — Progress Notes (Signed)
Patient experienced an occurrence of vomiting around 1800. Order placed for prn trimethobenzamide.

## 2021-03-07 NOTE — Progress Notes (Addendum)
SLP Cancellation Note  Patient Details Name: Melanie Lucas MRN: [id number] DOB: 1950/08/20   Cancelled treatment:       Reason Eval/Treat Not Completed: Patient at procedure or test/unavailable  Unable to complete cognitive-linguistic evaluation at this time, as pt is currently being transferred to another unit (12:27). Second attempt, pt currently off unit for MRI (13:52) Will continue efforts.  Kerrilynn Derenzo B. Quentin Ore, [facility], [place] Speech Language Pathologist Office: [phone]  Shonna Chock 03/07/2021, 12:27 PM, 13:52PM
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
## 2021-03-07 NOTE — ED Notes (Signed)
Patient monitor reading O2 sat of 87-89% on RA. Patient and pulse ox assessed. O2 94% on RA after assessment. Patient resting comfortably with no signs of distress noted.

## 2021-03-07 NOTE — ED Notes (Signed)
Report given to Teodoro Spray, RN on inpatient unit.

## 2021-03-07 NOTE — H&P (Signed)
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 diastolic dysfunction in 83/6629. -Strict intake and output -Daily weights -Follow-up echocardiogram -Lasix 40 mg IV x1 -Reassess and determine if need of further IV diuresis is warranted  Diabetes mellitus type 2: Hemoglobin A1c was 7.5 . Home medication regimen includes  Empagliflozin-metformin 12.5-1000mg  bid and repaglinide 2mg  bid. -Hypoglycemic protocols -Hold home oral regimen -CBGs before meals with moderate SSI -Adjust insulin regimen as needed  CAD  left carotid artery stenosis s/p CEA: Patient reported complaints of chest pain, but noted to have tenderness to palpation with imaging given concern for sternal fracture. -Check high-sensitivity troponin -Follow-up echocardiogram  and consult cardiology if  Lymphedema: Patient reports chronic lower extremity swelling since the age of 26. -Continue  to monitor  Hyperlipidemia: Reports intolerance to statins.  LDL was noted to be elevated at 125.  Patient had previously been on Crestor couple years ago. -Goal LDL less than 70 -Verified patient unable to tolerate Crestor  Tobacco abuse: Patient reports 55 smoking pack year history.  Denies wanting to quit at this time or need of nicotine patch. -Continue to counsel on need of cessation of tobacco use  Renal lesion: Incidentally found on CT scan 1.4 cm right indeterminate renal lesion.  -Nonemergent dedicated renal MRI recommended when able  Bilateral adrenal masses: Noted to be stable in size.   DVT prophylaxis: Eliquis Code Status: Full Family Communication: Attempted to call daughter's but no answer available.   Disposition Plan: To be determined Consults called:  Neurology  Admission status: Inpatient require more than 2 midnight stay in the setting of acute stroke  Norval Morton MD [facility]   If 7PM-7AM, please contact night-coverage   03/07/2021, 10:56 AM

## 2021-03-07 NOTE — ED Notes (Signed)
Report given to [facility]. 

## 2021-03-07 NOTE — Progress Notes (Signed)
Agricultural consultant and NT transported patient to [facility]

## 2021-03-07 NOTE — ED Notes (Signed)
Pt reporting mid-sternal CP and clutching her chest. Repeat EKG performed and shown to MD

## 2021-03-07 NOTE — Progress Notes (Signed)
Patient arrived to [facility], admitting MD placed orders for stroke work-up. Per charge RN patient may be more appropriate for  neuro floor, transfer requested.  Report called to 3W RN

## 2021-03-07 NOTE — Progress Notes (Signed)
PT Cancellation Note  Patient Details Name: Melanie Lucas MRN: [id number] DOB: [birth year]   Cancelled Treatment:    Reason Eval/Treat Not Completed: Patient at procedure or test/unavailable. Pt getting an echo at bedside and then left for MRI. PT to return as able to complete PT eval.  Kittie Plater, PT, DPT Acute Rehabilitation Services Pager #: [phone] Office #: [phone]    Berline Lopes 03/07/2021, 1:17 PM

## 2021-03-08 ENCOUNTER — Inpatient Hospital Stay (HOSPITAL_COMMUNITY): Payer: Medicare PPO

## 2021-03-08 DIAGNOSIS — I5032 Chronic diastolic (congestive) heart failure: Secondary | ICD-10-CM | POA: Diagnosis present

## 2021-03-08 DIAGNOSIS — I10 Essential (primary) hypertension: Secondary | ICD-10-CM

## 2021-03-08 DIAGNOSIS — I5033 Acute on chronic diastolic (congestive) heart failure: Secondary | ICD-10-CM | POA: Diagnosis present

## 2021-03-08 DIAGNOSIS — N289 Disorder of kidney and ureter, unspecified: Secondary | ICD-10-CM

## 2021-03-08 LAB — GLUCOSE, CAPILLARY
Glucose-Capillary: 122 mg/dL — ABNORMAL HIGH (ref 70–99)
Glucose-Capillary: 204 mg/dL — ABNORMAL HIGH (ref 70–99)
Glucose-Capillary: 187 mg/dL — ABNORMAL HIGH (ref 70–99)
Glucose-Capillary: 241 mg/dL — ABNORMAL HIGH (ref 70–99)

## 2021-03-08 LAB — BASIC METABOLIC PANEL
Anion gap: 12 (ref 5–15)
BUN: 21 mg/dL (ref 8–23)
CO2: 26 mmol/L (ref 22–32)
Calcium: 9.6 mg/dL (ref 8.9–10.3)
Chloride: 100 mmol/L (ref 98–111)
Creatinine, Ser: 0.69 mg/dL (ref 0.44–1.00)
GFR, Estimated: >60 mL/min (ref >60)
Glucose, Bld: 225 mg/dL — ABNORMAL HIGH (ref 70–99)
Potassium: 3.6 mmol/L (ref 3.5–5.1)
Sodium: 138 mmol/L (ref 135–145)

## 2021-03-08 LAB — MAGNESIUM: Magnesium: 2.1 mg/dL (ref 1.7–2.4)

## 2021-03-08 MED ORDER — POTASSIUM CHLORIDE 20 MEQ PO PACK
40.0000 meq | PACK | Freq: Once | ORAL | Status: AC
  Administered 2021-03-08: 40 meq via ORAL
  Filled 2021-03-08: qty 2

## 2021-03-08 MED ORDER — LACTULOSE 10 GM/15ML PO SOLN
20.0000 g | Freq: Two times a day (BID) | ORAL | Status: DC
  Administered 2021-03-08 – 2021-03-10 (×4): 20 g via ORAL
  Filled 2021-03-08 (×5): qty 30

## 2021-03-08 MED ORDER — IOHEXOL 350 MG/ML SOLN
75.0000 mL | Freq: Once | INTRAVENOUS | Status: AC | PRN
  Administered 2021-03-08: 75 mL via INTRAVENOUS

## 2021-03-08 MED ORDER — NICOTINE 14 MG/24HR TD PT24
14.0000 mg | MEDICATED_PATCH | Freq: Every day | TRANSDERMAL | Status: DC
  Administered 2021-03-08 – 2021-03-10 (×3): 14 mg via TRANSDERMAL
  Filled 2021-03-08 (×3): qty 1

## 2021-03-08 MED ORDER — ASPIRIN EC 325 MG PO TBEC
325.0000 mg | DELAYED_RELEASE_TABLET | Freq: Every day | ORAL | Status: DC
  Administered 2021-03-08: 325 mg via ORAL
  Filled 2021-03-08: qty 1

## 2021-03-08 MED ORDER — APIXABAN 5 MG PO TABS
5.0000 mg | ORAL_TABLET | Freq: Two times a day (BID) | ORAL | Status: DC
  Administered 2021-03-08 – 2021-03-10 (×4): 5 mg via ORAL
  Filled 2021-03-08 (×4): qty 1

## 2021-03-08 NOTE — Plan of Care (Signed)

## 2021-03-08 NOTE — Assessment & Plan Note (Addendum)
Home medication regimen includes empagliflozin and metformin.  Also on repaglinide.  HbA1c 7.5.  CBGs noted to be elevated.  Should be able to resume her home medication regimen at discharge.

## 2021-03-08 NOTE — Assessment & Plan Note (Signed)
Chest pain is secondary to sternal fracture.  Cardiac status noted to be stable.
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
## 2021-03-08 NOTE — Assessment & Plan Note (Signed)
Right renal lesion noted incidentally.  Will need outpatient evaluation.  Also noted to have bilateral adrenal masses which was stable compared to previous imaging studies as per report.

## 2021-03-08 NOTE — Assessment & Plan Note (Addendum)
Stable.  Was on furosemide as needed at home.
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
## 2021-03-08 NOTE — Progress Notes (Addendum)
Transition of Care [facility]) Screening Note   Patient Details  Name: Melanie Lucas Date of Birth: 29-Mar-1950   Transition of Care [facility]) CM/SW Contact:    Pollie Friar, RN Phone Number: 03/08/2021, 2:03 PM    Transition of Care Department [facility]) has reviewed patient. We will continue to monitor patient advancement through interdisciplinary progression rounds. If new patient transition needs arise, please place a TOC consult.  1600: Pt refused CIR and is asking to d/c home. She lives alone. CM met with her and she was agreeable to CM speaking with her daughter. CM spoke to [place] and she states patient will not have any assistance at home until the evenings when she and her sister get off work. She is interested in pt having rehab. She is coming to the hospital this pm to speak with her mother. CIR updated.  TOC following.

## 2021-03-08 NOTE — Assessment & Plan Note (Addendum)
Noted to be on losartan prior to admission.  Also on furosemide as needed.  Permissive hypertension being allowed.  Resume home medications at discharge.

## 2021-03-08 NOTE — Progress Notes (Addendum)
Inpatient Diabetes Program Recommendations  AACE/ADA: New Consensus Statement on Inpatient Glycemic Control (2015)  Target Ranges:  Prepandial:   less than 140 mg/dL      Peak postprandial:   less than 180 mg/dL (1-2 hours)      Critically ill patients:  140 - 180 mg/dL   Lab Results  Component Value Date   GLUCAP 122 (H) 03/08/2021   HGBA1C 7.5 (H) 03/07/2021    Review of Glycemic Control  Latest Reference Range & Units 03/07/21 18:14 03/07/21 21:48 03/08/21 06:26  Glucose-Capillary 70 - 99 mg/dL 115 (H) 267 (H) 122 (H)   Diabetes history: DM 2 Outpatient Diabetes medications: Empagliflozin-metformin 12.06-998 mg bid, Prandin 2 mg morning and bedtime Current orders for Inpatient glycemic control:  Novolog 0-15 units tid  A1c 7.5% on 03/07/21  Inpatient Diabetes Program Recommendations:    -   Add Novolog hs scale  May also need Novolog meal coverage insulin if trends increase after meal intake when eating.  Thanks,  Tama Headings RN, MSN, BC-ADM Inpatient Diabetes Coordinator Team Pager [phone] (8a-5p)

## 2021-03-08 NOTE — Progress Notes (Signed)
? ?  Inpatient Rehab Admissions Coordinator : ? ?Per therapy recommendations, patient was screened for CIR candidacy by Sondra Blixt RN MSN.  At this time patient appears to be a potential candidate for CIR. I will place a rehab consult per protocol for full assessment. Please call me with any questions. ? ?Marianny Goris RN MSN ?Admissions Coordinator ?[phone] ?  ?
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
## 2021-03-08 NOTE — Progress Notes (Signed)
Pt is scheduled for CTA and needs a 20g IV for contrast.  Per assessment and IV team's ultrasound assessment and IV attempts, patient's veins are insufficient and too small for for a 20g IV.   Will message Ordering MD to make aware.
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
## 2021-03-08 NOTE — Assessment & Plan Note (Addendum)
Continue Eliquis and Tikosyn.  Monitor electrolytes and replete as indicated.  Magnesium was 2.0 yesterday.   Potassium 3.8 today.  We will give additional dose today.

## 2021-03-08 NOTE — Assessment & Plan Note (Signed)
She reports intolerance to statins.

## 2021-03-08 NOTE — Assessment & Plan Note (Signed)
She was counseled.  However denied wanting to quit at this time.  Continue to consult.
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
## 2021-03-08 NOTE — Assessment & Plan Note (Addendum)
Likely due to acute stroke though it is unclear.  TSH was normal.  No evidence for infection.  Ammonia level was noted to be elevated at 59.  Unclear how much of this is contributing to her mentation.  Patient was started on lactulose.  Ammonia level improved to 31.  Continue lactulose for now.  Mentation seems to be stable

## 2021-03-08 NOTE — Assessment & Plan Note (Addendum)
Patient presented with left homonymous hemianopsia. MRI showed acute stroke.  No significant stenosis noted on CT angiogram head and neck. LDL 125.  HbA1c 7.5.  TSH 2.1. Echocardiogram showed normal systolic function.  Grade 1 diastolic dysfunction is noted. Patient noted to be on aspirin.  She was on Eliquis prior to admission.  This was initially placed on hold.  Subsequently resumed. No further testing recommended by neurology.  Patient was seen by PT and OT.  Inpatient rehabilitation was recommended.  Denied by [facility].  Now pursuing SNF.

## 2021-03-08 NOTE — Progress Notes (Addendum)
Inpatient Rehabilitation Admissions Coordinator   I met at bedside with patient for rehab assessment. We discussed goals and expectations of a possible CIR admit. She adamantly refuses and asks for home with [facility]. States her daughter is aware that she wants to go home. I have alerted acute team and TOC.  Danne Baxter, RN, MSN Rehab Admissions Coordinator [phone] 03/08/2021 2:48 PM   I will begin Auth with [employer] in case patient changes her mind about CIR admit after speaking with her daughter.  Danne Baxter, RN, MSN Rehab Admissions Coordinator [phone] 03/08/2021 3:24 PM

## 2021-03-08 NOTE — Assessment & Plan Note (Addendum)
See under paroxysmal atrial fibrillation.  Additional dose of potassium chloride today.

## 2021-03-09 LAB — COMPREHENSIVE METABOLIC PANEL
ALT: 14 U/L (ref 0–44)
AST: 14 U/L — ABNORMAL LOW (ref 15–41)
Albumin: 3.1 g/dL — ABNORMAL LOW (ref 3.5–5.0)
Alkaline Phosphatase: 63 U/L (ref 38–126)
Anion gap: 7 (ref 5–15)
BUN: 20 mg/dL (ref 8–23)
CO2: 28 mmol/L (ref 22–32)
Calcium: 8.7 mg/dL — ABNORMAL LOW (ref 8.9–10.3)
Chloride: 103 mmol/L (ref 98–111)
Creatinine, Ser: 0.65 mg/dL (ref 0.44–1.00)
GFR, Estimated: >60 mL/min (ref >60)
Glucose, Bld: 152 mg/dL — ABNORMAL HIGH (ref 70–99)
Potassium: 3.5 mmol/L (ref 3.5–5.1)
Sodium: 138 mmol/L (ref 135–145)
Total Bilirubin: 1 mg/dL (ref 0.3–1.2)
Total Protein: 5.6 g/dL — ABNORMAL LOW (ref 6.5–8.1)

## 2021-03-09 LAB — MAGNESIUM: Magnesium: 2 mg/dL (ref 1.7–2.4)

## 2021-03-09 LAB — GLUCOSE, CAPILLARY
Glucose-Capillary: 242 mg/dL — ABNORMAL HIGH (ref 70–99)
Glucose-Capillary: 211 mg/dL — ABNORMAL HIGH (ref 70–99)
Glucose-Capillary: 262 mg/dL — ABNORMAL HIGH (ref 70–99)
Glucose-Capillary: 198 mg/dL — ABNORMAL HIGH (ref 70–99)
Glucose-Capillary: 161 mg/dL — ABNORMAL HIGH (ref 70–99)
Glucose-Capillary: 178 mg/dL — ABNORMAL HIGH (ref 70–99)

## 2021-03-09 LAB — CBC
HCT: 43.9 % (ref 36.0–46.0)
Hemoglobin: 15.2 g/dL — ABNORMAL HIGH (ref 12.0–15.0)
MCH: 31.6 pg (ref 26.0–34.0)
MCHC: 34.6 g/dL (ref 30.0–36.0)
MCV: 91.3 fL (ref 80.0–100.0)
Platelets: 177 10*3/uL (ref 150–400)
RBC: 4.81 MIL/uL (ref 3.87–5.11)
RDW: 12.4 % (ref 11.5–15.5)
WBC: 6.1 10*3/uL (ref 4.0–10.5)
nRBC: 0 % (ref 0.0–0.2)

## 2021-03-09 LAB — AMMONIA: Ammonia: 31 umol/L (ref 9–35)

## 2021-03-09 MED ORDER — POTASSIUM CHLORIDE 20 MEQ PO PACK
40.0000 meq | PACK | Freq: Two times a day (BID) | ORAL | Status: AC
  Administered 2021-03-09 (×2): 40 meq via ORAL
  Filled 2021-03-09 (×2): qty 2

## 2021-03-09 MED ORDER — EMPAGLIFLOZIN 10 MG PO TABS
10.0000 mg | ORAL_TABLET | Freq: Two times a day (BID) | ORAL | Status: DC
  Administered 2021-03-09 – 2021-03-10 (×4): 10 mg via ORAL
  Filled 2021-03-09 (×5): qty 1

## 2021-03-09 MED ORDER — MIRABEGRON ER 25 MG PO TB24
50.0000 mg | ORAL_TABLET | Freq: Every day | ORAL | Status: DC
  Administered 2021-03-09 – 2021-03-10 (×2): 50 mg via ORAL
  Filled 2021-03-09 (×2): qty 2

## 2021-03-09 MED ORDER — EMPAGLIFLOZIN-METFORMIN HCL 12.5-1000 MG PO TABS: 1.0000 | ORAL_TABLET | Freq: Two times a day (BID) | ORAL | Status: DC

## 2021-03-09 MED ORDER — METFORMIN HCL 500 MG PO TABS
1000.0000 mg | ORAL_TABLET | Freq: Two times a day (BID) | ORAL | Status: DC
  Administered 2021-03-10: 1000 mg via ORAL
  Filled 2021-03-09: qty 2

## 2021-03-09 NOTE — Progress Notes (Signed)
CSW notified that patient's daughter has talked to the patient, patient now interested in CIR admit. CSW notified Rehab Admissions coordinator, they will follow up.  Laveda Abbe,  Clinical Social Worker [phone]

## 2021-03-09 NOTE — NC FL2 (Signed)
[place] LEVEL OF CARE SCREENING TOOL     IDENTIFICATION  Patient Name: Melanie Lucas Birthdate: Dec 31, 1950 Sex: female Admission Date (Current Location): 03/06/2021  [facility] and [place] Number:  Herbalist and Address:  The [facility], [place] [address] [place], [place] [address]      Provider Number: [id number]  Attending Physician Name and Address:  Bonnielee Haff, MD  Relative Name and Phone Number:       Current Level of Care: Hospital Recommended Level of Care: [place] Prior Approval Number:    Date Approved/Denied:   PASRR Number: [id number]  Discharge Plan: SNF    Current Diagnoses: Patient Active Problem List   Diagnosis Date Noted   Chronic diastolic CHF (congestive heart failure) ([place]) 03/08/2021   Renal lesion 03/08/2021   Sternal fracture 03/07/2021   Hypokalemia 03/07/2021   CVA (cerebral vascular accident) ([place]) 03/07/2021   Chronic anticoagulation 03/07/2021   Left homonymous hemianopsia 03/06/2021   Stroke-like episode 24/40/1027   Acute metabolic encephalopathy 25/36/6440   Depression 12/31/2017   Stroke ([place]) 12/31/2017   COPD (chronic obstructive pulmonary disease) ([place]) 12/30/2017   Left carotid artery stenosis 07/29/2017   TIA (transient ischemic attack) 06/24/2017   Ischemic stroke ([place]) 06/23/2017   Cervical spondylosis with myelopathy 02/21/2016   Bilateral leg edema 11/24/2014   Cervical stenosis of spine 11/24/2014   Numbness of fingers of both hands 11/24/2014   HLD (hyperlipidemia) 11/09/2014   Type 2 diabetes mellitus without complication ([place]) 34/74/2595   Pulmonary hypertension ([place]) 08/07/2013   CAD (coronary artery disease) 03/11/2013   Peripheral edema 02/25/2012   Essential hypertension, benign 05/06/2009   Overweight 04/13/2009   TOBACCO ABUSE 04/13/2009   Paroxysmal atrial fibrillation ([place]) 04/13/2009   CHEST PAIN, ATYPICAL 04/13/2009    Orientation  RESPIRATION BLADDER Height & Weight     Self, Time, Situation, Place  Normal Continent Weight: 160 lb (72.6 kg) Height:  5\' 7"  (170.2 cm)  BEHAVIORAL SYMPTOMS/MOOD NEUROLOGICAL BOWEL NUTRITION STATUS      Continent Diet (heart healthy/carb modified)  AMBULATORY STATUS COMMUNICATION OF NEEDS Skin   Limited Assist Verbally Normal                       Personal Care Assistance Level of Assistance  Bathing, Feeding, Dressing Bathing Assistance: Limited assistance Feeding assistance: Independent Dressing Assistance: Limited assistance     Functional Limitations Info  Hearing   Hearing Info: Impaired (impaired hearing, left ear)      SPECIAL CARE FACTORS FREQUENCY  PT (By licensed PT), OT (By licensed OT)     PT Frequency: 5x/wk OT Frequency: 5x/wk            Contractures Contractures Info: Not present    Additional Factors Info  Code Status, Allergies, Psychotropic, Insulin Sliding Scale Code Status Info: Full Allergies Info: Avelox (Moxifloxacin), Chantix (Varenicline), Fosamax (Alendronate), Pradaxa (Dabigatran Etexilate Mesylate), Statins, Sulfamethoxazole, Sulfonamide Derivatives, Ciprofloxacin, Gabapentin, Tape Psychotropic Info: Cymbalta 60mg  daily Insulin Sliding Scale Info: see DC summary       Current Medications (03/09/2021):  This is the current hospital active medication list Current Facility-Administered Medications  Medication Dose Route Frequency Provider Last Rate Last Admin   acetaminophen (TYLENOL) tablet 650 mg  650 mg Oral Q4H PRN Fuller Plan A, MD   650 mg at 03/08/21 2150   Or   acetaminophen (TYLENOL) 160 MG/5ML solution 650 mg  650 mg Per Tube Q4H PRN  Norval Morton, MD       Or   acetaminophen (TYLENOL) suppository 650 mg  650 mg Rectal Q4H PRN Fuller Plan A, MD       apixaban (ELIQUIS) tablet 5 mg  5 mg Oral BID Bonnielee Haff, MD   5 mg at 03/09/21 1047   butalbital-acetaminophen-caffeine (FIORICET) 50-325-40 MG per tablet 1  tablet  1 tablet Oral Q6H PRN Fuller Plan A, MD       diazepam (VALIUM) tablet 10 mg  10 mg Oral QHS PRN Fuller Plan A, MD   10 mg at 03/08/21 2150   diclofenac Sodium (VOLTAREN) 1 % topical gel 2 g  2 g Topical BID Fuller Plan A, MD   2 g at 03/09/21 1058   dofetilide (TIKOSYN) capsule 500 mcg  500 mcg Oral BID Fuller Plan A, MD   500 mcg at 03/09/21 1047   DULoxetine (CYMBALTA) DR capsule 60 mg  60 mg Oral Daily Smith, Rondell A, MD   60 mg at 03/09/21 1047   empagliflozin (JARDIANCE) tablet 10 mg  10 mg Oral BID WC Bonnielee Haff, MD   10 mg at 03/09/21 1231   And   [START ON 03/10/2021] metFORMIN (GLUCOPHAGE) tablet 1,000 mg  1,000 mg Oral BID WC Bonnielee Haff, MD       insulin aspart (novoLOG) injection 0-15 Units  0-15 Units Subcutaneous TID WC Smith, Rondell A, MD   8 Units at 03/09/21 1459   lactulose (CHRONULAC) 10 GM/15ML solution 20 g  20 g Oral BID Bonnielee Haff, MD   20 g at 03/09/21 1047   linaclotide (LINZESS) capsule 290 mcg  290 mcg Oral QAC breakfast Fuller Plan A, MD   290 mcg at 03/09/21 0627   mirabegron ER (MYRBETRIQ) tablet 50 mg  50 mg Oral Daily Bonnielee Haff, MD   50 mg at 03/09/21 1232   nicotine (NICODERM CQ - dosed in mg/24 hours) patch 14 mg  14 mg Transdermal Daily Bonnielee Haff, MD   14 mg at 03/09/21 1049   potassium chloride (KLOR-CON) packet 40 mEq  40 mEq Oral BID Bonnielee Haff, MD   40 mEq at 03/09/21 1232   trimethobenzamide (TIGAN) injection 200 mg  200 mg Intramuscular Q6H PRN Norval Morton, MD         Discharge Medications: Please see discharge summary for a list of discharge medications.  Relevant Imaging Results:  Relevant Lab Results:   Additional Information SS#: [id number]  Geralynn Ochs, LCSW
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
## 2021-03-09 NOTE — Progress Notes (Signed)
Inpatient Rehabilitation Admissions Coordinator   I have received a denial from [facility] for CIR. I notified daughter, [name] , patient and SW. We will sign off at this time.  Danne Baxter, RN, MSN Rehab Admissions Coordinator [phone] 03/09/2021 1:14 PM

## 2021-03-09 NOTE — TOC Initial Note (Signed)
Transition of Care [facility]) - Initial/Assessment Note    Patient Details  Name: Melanie Lucas MRN: [id number] Date of Birth: 1950/08/08  Transition of [facility]) CM/SW Contact:    Geralynn Ochs, LCSW Phone Number: 03/09/2021, 3:42 PM  Clinical Narrative:          CSW notified by Rehab Admissions that insurance has denied CIR. CSW met with patient to discuss SNF placement, and she says she isn't going anywhere if she can't stay in the hospital. CSW explained that insurance won't pay for it, and patient said that then she'd go home. Patient said her husband had been in a place and her best friend had been in a place and they were both terrible, she wasn't interested. CSW left a voicemail for daughter, [name], to discuss with her. [name] called back and has discussed with patient, she is agreeable as long as it isn't the places she doesn't want to go to. CSW has faxed out referral, will follow up with bed offers.         Expected Discharge Plan: Skilled Nursing Facility Barriers to Discharge: Continued Medical Work up, Ship broker   Patient Goals and CMS Choice Patient states their goals for this hospitalization and ongoing recovery are:: to get back home to her dogs CMS Medicare.gov Compare Post Acute Care list provided to:: Patient Choice offered to / list presented to : Patient  Expected Discharge Plan and Services Expected Discharge Plan: [place] Choice: [place] arrangements for the past 2 months: Single Family Home                                      Prior Living Arrangements/Services Living arrangements for the past 2 months: Single Family Home Lives with:: Self Patient language and need for interpreter reviewed:: No Do you feel safe going back to the place where you live?: Yes      Need for Family Participation in Patient Care: No (Comment) Care giver support system in place?: No (comment)    Criminal Activity/Legal Involvement Pertinent to Current Situation/Hospitalization: No - Comment as needed  Activities of Daily Living      Permission Sought/Granted Permission sought to share information with : Facility Sport and exercise psychologist, Family Supports Permission granted to share information with : Yes, Verbal Permission Granted  Share Information with NAME: [name]  Permission granted to share info w AGENCY: SNF  Permission granted to share info w Relationship: Daughter     Emotional Assessment Appearance:: Appears stated age Attitude/Demeanor/Rapport: Engaged Affect (typically observed): Appropriate Orientation: : Oriented to Self, Oriented to Place, Oriented to  Time, Oriented to Situation Alcohol / Substance Use: Tobacco Use Psych Involvement: No (comment)  Admission diagnosis:  Left homonymous hemianopsia [H53.462] Chest pain [R07.9] CVA (cerebral vascular accident) [facility]) [I63.9] Patient Active Problem List   Diagnosis Date Noted   Chronic diastolic CHF (congestive heart failure) ([place]) 03/08/2021   Renal lesion 03/08/2021   Sternal fracture 03/07/2021   Hypokalemia 03/07/2021   CVA (cerebral vascular accident) ([place]) 03/07/2021   Chronic anticoagulation 03/07/2021   Left homonymous hemianopsia 03/06/2021   Stroke-like episode 67/34/1937   Acute metabolic encephalopathy 90/24/0973   Depression 12/31/2017   Stroke ([place]) 12/31/2017   COPD (chronic obstructive pulmonary disease) ([place]) 12/30/2017   Left carotid artery stenosis 07/29/2017   TIA (transient ischemic attack) 06/24/2017   Ischemic  stroke ([place]) 06/23/2017   Cervical spondylosis with myelopathy 02/21/2016   Bilateral leg edema 11/24/2014   Cervical stenosis of spine 11/24/2014   Numbness of fingers of both hands 11/24/2014   HLD (hyperlipidemia) 11/09/2014   Type 2 diabetes mellitus without complication ([place]) 39/04/90   Pulmonary hypertension ([place]) 08/07/2013   CAD (coronary artery disease)  03/11/2013   Peripheral edema 02/25/2012   Essential hypertension, benign 05/06/2009   Overweight 04/13/2009   TOBACCO ABUSE 04/13/2009   Paroxysmal atrial fibrillation ([place]) 04/13/2009   CHEST PAIN, ATYPICAL 04/13/2009   PCP:  Kristen Loader, FNP Pharmacy:   [id number] [place] [place] [address]Phone: [phone]: [phone]    Social Determinants of Health (SDOH) Interventions    Readmission Risk Interventions No flowsheet data found.
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
## 2021-03-09 NOTE — Progress Notes (Signed)
Inpatient Rehabilitation Admissions Coordinator  ° °I met with patient at bedside with her daughter, [name] on speaker. W discussed goals and  expectations of a possible CIR admit as well as cost of care. Patient is in agreement. I await insurance approval for possible admit today. ° ° , RN, MSN °Rehab Admissions Coordinator °[phone] °03/09/2021 10:59 AM ° °

## 2021-03-09 NOTE — Plan of Care (Signed)

## 2021-03-09 NOTE — Plan of Care (Signed)
  Problem: Clinical Measurements: Goal: Ability to maintain clinical measurements within normal limits will improve Outcome: Progressing   

## 2021-03-10 DIAGNOSIS — R531 Weakness: Secondary | ICD-10-CM | POA: Diagnosis not present

## 2021-03-10 DIAGNOSIS — I5042 Chronic combined systolic (congestive) and diastolic (congestive) heart failure: Secondary | ICD-10-CM | POA: Diagnosis not present

## 2021-03-10 DIAGNOSIS — E782 Mixed hyperlipidemia: Secondary | ICD-10-CM | POA: Diagnosis not present

## 2021-03-10 DIAGNOSIS — I110 Hypertensive heart disease with heart failure: Secondary | ICD-10-CM | POA: Diagnosis not present

## 2021-03-10 DIAGNOSIS — Z8673 Personal history of transient ischemic attack (TIA), and cerebral infarction without residual deficits: Secondary | ICD-10-CM | POA: Diagnosis not present

## 2021-03-10 DIAGNOSIS — S2220XD Unspecified fracture of sternum, subsequent encounter for fracture with routine healing: Secondary | ICD-10-CM | POA: Diagnosis not present

## 2021-03-10 DIAGNOSIS — E118 Type 2 diabetes mellitus with unspecified complications: Secondary | ICD-10-CM | POA: Diagnosis not present

## 2021-03-10 DIAGNOSIS — H53462 Homonymous bilateral field defects, left side: Secondary | ICD-10-CM | POA: Diagnosis not present

## 2021-03-10 DIAGNOSIS — R799 Abnormal finding of blood chemistry, unspecified: Secondary | ICD-10-CM | POA: Diagnosis not present

## 2021-03-10 DIAGNOSIS — F17290 Nicotine dependence, other tobacco product, uncomplicated: Secondary | ICD-10-CM | POA: Diagnosis not present

## 2021-03-10 DIAGNOSIS — R2681 Unsteadiness on feet: Secondary | ICD-10-CM | POA: Diagnosis not present

## 2021-03-10 DIAGNOSIS — I10 Essential (primary) hypertension: Secondary | ICD-10-CM | POA: Diagnosis not present

## 2021-03-10 DIAGNOSIS — I639 Cerebral infarction, unspecified: Secondary | ICD-10-CM | POA: Diagnosis not present

## 2021-03-10 DIAGNOSIS — I480 Paroxysmal atrial fibrillation: Secondary | ICD-10-CM | POA: Diagnosis not present

## 2021-03-10 DIAGNOSIS — G9341 Metabolic encephalopathy: Secondary | ICD-10-CM | POA: Diagnosis not present

## 2021-03-10 DIAGNOSIS — I4891 Unspecified atrial fibrillation: Secondary | ICD-10-CM | POA: Diagnosis not present

## 2021-03-10 DIAGNOSIS — S2223XA Sternal manubrial dissociation, initial encounter for closed fracture: Secondary | ICD-10-CM | POA: Diagnosis not present

## 2021-03-10 DIAGNOSIS — Z743 Need for continuous supervision: Secondary | ICD-10-CM | POA: Diagnosis not present

## 2021-03-10 DIAGNOSIS — M6281 Muscle weakness (generalized): Secondary | ICD-10-CM | POA: Diagnosis not present

## 2021-03-10 DIAGNOSIS — R41841 Cognitive communication deficit: Secondary | ICD-10-CM | POA: Diagnosis not present

## 2021-03-10 DIAGNOSIS — M6259 Muscle wasting and atrophy, not elsewhere classified, multiple sites: Secondary | ICD-10-CM | POA: Diagnosis not present

## 2021-03-10 DIAGNOSIS — J441 Chronic obstructive pulmonary disease with (acute) exacerbation: Secondary | ICD-10-CM | POA: Diagnosis not present

## 2021-03-10 LAB — COMPREHENSIVE METABOLIC PANEL
ALT: 16 U/L (ref 0–44)
AST: 14 U/L — ABNORMAL LOW (ref 15–41)
Albumin: 3.1 g/dL — ABNORMAL LOW (ref 3.5–5.0)
Alkaline Phosphatase: 60 U/L (ref 38–126)
Anion gap: 13 (ref 5–15)
BUN: 19 mg/dL (ref 8–23)
CO2: 25 mmol/L (ref 22–32)
Calcium: 8.9 mg/dL (ref 8.9–10.3)
Chloride: 103 mmol/L (ref 98–111)
Creatinine, Ser: 0.64 mg/dL (ref 0.44–1.00)
GFR, Estimated: >60 mL/min (ref >60)
Glucose, Bld: 142 mg/dL — ABNORMAL HIGH (ref 70–99)
Potassium: 3.8 mmol/L (ref 3.5–5.1)
Sodium: 141 mmol/L (ref 135–145)
Total Bilirubin: 0.8 mg/dL (ref 0.3–1.2)
Total Protein: 5.4 g/dL — ABNORMAL LOW (ref 6.5–8.1)

## 2021-03-10 LAB — CBC
HCT: 44.6 % (ref 36.0–46.0)
Hemoglobin: 15.4 g/dL — ABNORMAL HIGH (ref 12.0–15.0)
MCH: 31.8 pg (ref 26.0–34.0)
MCHC: 34.5 g/dL (ref 30.0–36.0)
MCV: 92 fL (ref 80.0–100.0)
Platelets: 180 10*3/uL (ref 150–400)
RBC: 4.85 MIL/uL (ref 3.87–5.11)
RDW: 12.3 % (ref 11.5–15.5)
WBC: 5.5 10*3/uL (ref 4.0–10.5)
nRBC: 0 % (ref 0.0–0.2)

## 2021-03-10 LAB — RESP PANEL BY RT-PCR (FLU A&B, COVID) ARPGX2
Influenza A by PCR: NEGATIVE
Influenza B by PCR: NEGATIVE
SARS Coronavirus 2 by RT PCR: NEGATIVE

## 2021-03-10 LAB — GLUCOSE, CAPILLARY
Glucose-Capillary: 148 mg/dL — ABNORMAL HIGH (ref 70–99)
Glucose-Capillary: 156 mg/dL — ABNORMAL HIGH (ref 70–99)
Glucose-Capillary: 194 mg/dL — ABNORMAL HIGH (ref 70–99)

## 2021-03-10 MED ORDER — NICOTINE 14 MG/24HR TD PT24: 14.0000 mg | MEDICATED_PATCH | Freq: Every day | TRANSDERMAL | 0 refills | Status: DC

## 2021-03-10 MED ORDER — POTASSIUM CHLORIDE 20 MEQ PO PACK
40.0000 meq | PACK | Freq: Once | ORAL | Status: AC
  Administered 2021-03-10: 40 meq via ORAL
  Filled 2021-03-10: qty 2

## 2021-03-10 MED ORDER — DIAZEPAM 10 MG PO TABS: 10.0000 mg | ORAL_TABLET | Freq: Every evening | ORAL | 0 refills | Status: DC | PRN

## 2021-03-10 MED ORDER — LACTULOSE 10 GM/15ML PO SOLN: 20.0000 g | Freq: Two times a day (BID) | ORAL | 0 refills | Status: DC
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
## 2021-03-10 NOTE — TOC Progression Note (Signed)
Transition of Care [facility]) - Progression Note    Patient Details  Name: Melanie Lucas MRN: [id number] Date of Birth: [birth year]  Transition of Care [facility]) CM/SW [place], [place] Phone Number: 03/10/2021, 1:06 PM  Clinical Narrative:   CSW met with patient to discuss SNF placement. Patient said she will go, but not more than two weeks. CSW provided bed offers and discussed choices. Patient asked about [facility], and asked CSW to call [name]. CSW spoke with [name] and provided bed offers. [name] asked about [facility], and they can offer a bed. Bed available today pending insurance authorization approval. CSW submitted for authorization request, will follow.    Expected Discharge Plan: [place] Barriers to Discharge: Continued Medical Work up, Ship broker  Expected Discharge Plan and Services Expected Discharge Plan: [place] Choice: [place] arrangements for the past 2 months: Single Family Home                                       Social Determinants of Health (SDOH) Interventions    Readmission Risk Interventions No flowsheet data found.
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
## 2021-03-10 NOTE — Discharge Summary (Signed)
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 dilitation. Venous: The inferior vena cava is normal in size with greater than 50% respiratory variability, suggesting right atrial pressure of 3 mmHg. IAS/Shunts: The atrial septum is grossly normal.  LEFT VENTRICLE PLAX 2D LVIDd:         5.20 cm   Diastology LVIDs:         3.60 cm   LV e' medial:    5.78 cm/s LV PW:         1.20 cm   LV E/e' medial:  13.1 LV IVS:        1.20 cm   LV e' lateral:   6.20 cm/s LVOT diam:     1.80 cm   LV E/e' lateral: 12.2 LV SV:         55 LV SV Index:   30 LVOT Area:     2.54 cm  LEFT ATRIUM           Index LA diam:      4.20 cm 2.28 cm/m LA Vol (A4C): 62.3 ml 33.87 ml/m  AORTIC VALVE                     PULMONIC VALVE AV Area (Vmax):    1.57 cm      PV Vmax:       1.14 m/s AV Area (Vmean):   1.47 cm      PV Peak grad:  5.2 mmHg AV Area (VTI):     1.58 cm AV Vmax:           181.00 cm/s AV Vmean:           115.000 cm/s AV VTI:            0.351 m AV Peak Grad:      13.1 mmHg AV Mean Grad:      6.0 mmHg LVOT Vmax:         112.00 cm/s LVOT Vmean:        66.600 cm/s LVOT VTI:          0.218 m LVOT/AV VTI ratio: 0.62  AORTA Ao Root diam: 3.10 cm Ao Asc diam:  3.10 cm MITRAL VALVE MV Area (PHT): 3.08 cm     SHUNTS MV Decel Time: 246 msec     Systemic VTI:  0.22 m MV E  velocity: 75.50 cm/s   Systemic Diam: 1.80 cm MV A velocity: 104.00 cm/s MV E/A ratio:  0.73 Eleonore Chiquito MD Electronically signed by Eleonore Chiquito MD Signature Date/Time: 03/07/2021/2:54:19 PM    Final     DISCHARGE EXAMINATION: Vitals:   03/10/21 0428 03/10/21 0757 03/10/21 0826 03/10/21 1214  BP: (!) 169/75 (!) 157/79  (!) 174/81  Pulse: 62 (!) 59 61 (!) 54  Resp: 17 19  20   Temp: 98.5 F (36.9 C) 98.1 F (36.7 C)  98.3 F (36.8 C)  TempSrc: Oral Oral  Oral  SpO2: 97% 97%  97%  Weight:      Height:       See progress note from earlier today  DISPOSITION: SNF  Discharge Instructions     Call MD for:  difficulty breathing, headache or visual disturbances   Complete by: As directed    Call MD for:  extreme fatigue   Complete by: As directed    Call MD for:  persistant dizziness or light-headedness   Complete by: As directed    Call MD for:  persistant nausea and vomiting   Complete by: As directed    Call MD for:  severe uncontrolled pain   Complete by: As directed    Call MD for:  temperature >100.4   Complete by: As directed    Diet - low sodium heart healthy   Complete by: As directed    Discharge instructions   Complete by: As directed    Please review instructions on the discharge summary.  You were cared for by a hospitalist during your hospital stay. If you have any questions about your discharge medications or the care you received while you were in the hospital after you are discharged, you can call the unit and asked to speak with the hospitalist on call if the hospitalist that took care of you is not  available. Once you are discharged, your primary care physician will handle any further medical issues. Please note that NO REFILLS for any discharge medications will be authorized once you are discharged, as it is imperative that you return to your primary care physician (or establish a relationship with a primary care physician if you do not have one) for your aftercare needs so that they can reassess your need for medications and monitor your lab values. If you do not have a primary care physician, you can call [phone] for a physician referral.   Increase activity slowly   Complete by: As directed          Allergies as of 03/10/2021       Reactions   Avelox [moxifloxacin]    Unknown reaction    Chantix [varenicline]    nightmares   Fosamax [alendronate]    Pain all over   Pradaxa [dabigatran Etexilate Mesylate]    Extreme bleeding   Statins Nausea And Vomiting, Other (See Comments)   Muscle pain, Dizziness (intolerance)   Sulfamethoxazole Nausea And Vomiting   Dizziness (intolerance)   Sulfonamide Derivatives Hives   Nausea vertigo   Ciprofloxacin Rash   Gabapentin Rash   burning   Tape Itching, Rash   Please use "paper" tape only        Medication List     TAKE these medications    diazepam 10 MG tablet Commonly known as: VALIUM Take 1 tablet (10 mg total) by mouth at bedtime as needed for sleep.   dofetilide 500 MCG capsule Commonly known as: TIKOSYN TAKE 1 CAPSULE  BY MOUTH 2 TIMES DAILY.   DULoxetine 60 MG capsule Commonly known as: CYMBALTA Take 60 mg by mouth daily.   Eliquis 5 MG Tabs tablet Generic drug: apixaban TAKE 1 TABLET BY MOUTH TWICE A DAY What changed: how much to take   Empagliflozin-metFORMIN HCl 12.06-998 MG Tabs Take 1 tablet by mouth 2 (two) times daily.   furosemide 20 MG tablet Commonly known as: LASIX Take 20 mg by mouth daily as needed for edema.   lactulose 10 GM/15ML solution Commonly known as: CHRONULAC Take 30 mLs (20 g  total) by mouth 2 (two) times daily.   linaclotide 290 MCG Caps capsule Commonly known as: LINZESS Take 290 mcg by mouth daily before breakfast.   losartan 50 MG tablet Commonly known as: COZAAR Take 50 mg by mouth daily.   mirabegron ER 50 MG Tb24 tablet Commonly known as: MYRBETRIQ Take 50 mg by mouth daily.   multivitamin tablet Take 1 tablet by mouth at bedtime.   nicotine 14 mg/24hr patch Commonly known as: NICODERM CQ - dosed in mg/24 hours Place 1 patch (14 mg total) onto the skin daily. Start taking on: March 11, 2021   repaglinide 2 MG tablet Commonly known as: PRANDIN Take 2 mg by mouth in the morning and at bedtime.           TOTAL DISCHARGE TIME: 35 minutes  Mitul Hallowell [employer] on [email or web address]  03/10/2021, 12:34 PM

## 2021-03-10 NOTE — TOC Transition Note (Addendum)
Transition of Care [facility]) - CM/SW Discharge Note   Patient Details  Name: LYLITH BEBEAU MRN: [id number] Date of Birth: 1950-04-02  [facility] CM/SW Contact:  Geralynn Ochs, LCSW Phone Number: 03/10/2021, 3:21 PM   Clinical Narrative:   Nurse to call report to [phone], Room 603.  Nurse please call daughter, [name], when Corey Harold picks up the patient, [phone].    Final next level of care: Acute to Acute Transfer Barriers to Discharge: Barriers Resolved   Patient Goals and CMS Choice Patient states their goals for this hospitalization and ongoing recovery are:: to get back home to her dogs CMS Medicare.gov Compare Post Acute Care list provided to:: Patient Choice offered to / list presented to : Patient  Discharge Placement              Patient chooses bed at: [facility] Patient to be transferred to facility by: [place] Name of family member notified: [name] Patient and family notified of of transfer: 03/10/21  Discharge Plan and Services     Post Acute Care Choice: [place]                               Social Determinants of Health (SDOH) Interventions     Readmission Risk Interventions No flowsheet data found.

## 2021-03-13 DIAGNOSIS — I5042 Chronic combined systolic (congestive) and diastolic (congestive) heart failure: Secondary | ICD-10-CM | POA: Diagnosis not present

## 2021-03-13 DIAGNOSIS — J441 Chronic obstructive pulmonary disease with (acute) exacerbation: Secondary | ICD-10-CM | POA: Diagnosis not present

## 2021-03-13 DIAGNOSIS — Z8673 Personal history of transient ischemic attack (TIA), and cerebral infarction without residual deficits: Secondary | ICD-10-CM | POA: Diagnosis not present

## 2021-03-13 DIAGNOSIS — S2223XA Sternal manubrial dissociation, initial encounter for closed fracture: Secondary | ICD-10-CM | POA: Diagnosis not present

## 2021-03-13 DIAGNOSIS — I110 Hypertensive heart disease with heart failure: Secondary | ICD-10-CM | POA: Diagnosis not present

## 2021-03-13 DIAGNOSIS — E118 Type 2 diabetes mellitus with unspecified complications: Secondary | ICD-10-CM | POA: Diagnosis not present

## 2021-03-13 DIAGNOSIS — E782 Mixed hyperlipidemia: Secondary | ICD-10-CM | POA: Diagnosis not present

## 2021-03-13 DIAGNOSIS — I10 Essential (primary) hypertension: Secondary | ICD-10-CM | POA: Diagnosis not present

## 2021-03-13 DIAGNOSIS — I480 Paroxysmal atrial fibrillation: Secondary | ICD-10-CM | POA: Diagnosis not present

## 2021-03-15 DIAGNOSIS — E118 Type 2 diabetes mellitus with unspecified complications: Secondary | ICD-10-CM | POA: Diagnosis not present

## 2021-03-15 DIAGNOSIS — E782 Mixed hyperlipidemia: Secondary | ICD-10-CM | POA: Diagnosis not present

## 2021-03-15 DIAGNOSIS — I110 Hypertensive heart disease with heart failure: Secondary | ICD-10-CM | POA: Diagnosis not present

## 2021-03-15 DIAGNOSIS — I5042 Chronic combined systolic (congestive) and diastolic (congestive) heart failure: Secondary | ICD-10-CM | POA: Diagnosis not present

## 2021-03-15 DIAGNOSIS — I480 Paroxysmal atrial fibrillation: Secondary | ICD-10-CM | POA: Diagnosis not present

## 2021-03-15 DIAGNOSIS — Z8673 Personal history of transient ischemic attack (TIA), and cerebral infarction without residual deficits: Secondary | ICD-10-CM | POA: Diagnosis not present

## 2021-03-15 DIAGNOSIS — I10 Essential (primary) hypertension: Secondary | ICD-10-CM | POA: Diagnosis not present

## 2021-03-15 DIAGNOSIS — J441 Chronic obstructive pulmonary disease with (acute) exacerbation: Secondary | ICD-10-CM | POA: Diagnosis not present

## 2021-03-15 DIAGNOSIS — S2223XA Sternal manubrial dissociation, initial encounter for closed fracture: Secondary | ICD-10-CM | POA: Diagnosis not present

## 2021-03-16 DIAGNOSIS — I4891 Unspecified atrial fibrillation: Secondary | ICD-10-CM | POA: Diagnosis not present

## 2021-03-16 DIAGNOSIS — I10 Essential (primary) hypertension: Secondary | ICD-10-CM | POA: Diagnosis not present

## 2021-03-16 DIAGNOSIS — I639 Cerebral infarction, unspecified: Secondary | ICD-10-CM | POA: Diagnosis not present

## 2021-03-16 DIAGNOSIS — I480 Paroxysmal atrial fibrillation: Secondary | ICD-10-CM | POA: Diagnosis not present

## 2021-03-16 DIAGNOSIS — S2223XA Sternal manubrial dissociation, initial encounter for closed fracture: Secondary | ICD-10-CM | POA: Diagnosis not present

## 2021-03-16 DIAGNOSIS — I5042 Chronic combined systolic (congestive) and diastolic (congestive) heart failure: Secondary | ICD-10-CM | POA: Diagnosis not present

## 2021-03-16 DIAGNOSIS — Z8673 Personal history of transient ischemic attack (TIA), and cerebral infarction without residual deficits: Secondary | ICD-10-CM | POA: Diagnosis not present

## 2021-03-16 DIAGNOSIS — J441 Chronic obstructive pulmonary disease with (acute) exacerbation: Secondary | ICD-10-CM | POA: Diagnosis not present

## 2021-03-16 DIAGNOSIS — G9341 Metabolic encephalopathy: Secondary | ICD-10-CM | POA: Diagnosis not present

## 2021-03-16 DIAGNOSIS — E782 Mixed hyperlipidemia: Secondary | ICD-10-CM | POA: Diagnosis not present

## 2021-03-16 DIAGNOSIS — F17290 Nicotine dependence, other tobacco product, uncomplicated: Secondary | ICD-10-CM | POA: Diagnosis not present

## 2021-03-16 DIAGNOSIS — I110 Hypertensive heart disease with heart failure: Secondary | ICD-10-CM | POA: Diagnosis not present

## 2021-03-16 DIAGNOSIS — E118 Type 2 diabetes mellitus with unspecified complications: Secondary | ICD-10-CM | POA: Diagnosis not present

## 2021-03-16 DIAGNOSIS — H53462 Homonymous bilateral field defects, left side: Secondary | ICD-10-CM | POA: Diagnosis not present

## 2021-03-17 DIAGNOSIS — J441 Chronic obstructive pulmonary disease with (acute) exacerbation: Secondary | ICD-10-CM | POA: Diagnosis not present

## 2021-03-17 DIAGNOSIS — S2223XA Sternal manubrial dissociation, initial encounter for closed fracture: Secondary | ICD-10-CM | POA: Diagnosis not present

## 2021-03-17 DIAGNOSIS — I110 Hypertensive heart disease with heart failure: Secondary | ICD-10-CM | POA: Diagnosis not present

## 2021-03-17 DIAGNOSIS — E782 Mixed hyperlipidemia: Secondary | ICD-10-CM | POA: Diagnosis not present

## 2021-03-17 DIAGNOSIS — I10 Essential (primary) hypertension: Secondary | ICD-10-CM | POA: Diagnosis not present

## 2021-03-17 DIAGNOSIS — Z8673 Personal history of transient ischemic attack (TIA), and cerebral infarction without residual deficits: Secondary | ICD-10-CM | POA: Diagnosis not present

## 2021-03-17 DIAGNOSIS — I5042 Chronic combined systolic (congestive) and diastolic (congestive) heart failure: Secondary | ICD-10-CM | POA: Diagnosis not present

## 2021-03-17 DIAGNOSIS — I480 Paroxysmal atrial fibrillation: Secondary | ICD-10-CM | POA: Diagnosis not present

## 2021-03-17 DIAGNOSIS — E118 Type 2 diabetes mellitus with unspecified complications: Secondary | ICD-10-CM | POA: Diagnosis not present

## 2021-03-18 ENCOUNTER — Other Ambulatory Visit: Payer: Self-pay | Admitting: Internal Medicine

## 2021-03-19 DIAGNOSIS — I5032 Chronic diastolic (congestive) heart failure: Secondary | ICD-10-CM | POA: Diagnosis not present

## 2021-03-19 DIAGNOSIS — S2220XD Unspecified fracture of sternum, subsequent encounter for fracture with routine healing: Secondary | ICD-10-CM | POA: Diagnosis not present

## 2021-03-19 DIAGNOSIS — I69398 Other sequelae of cerebral infarction: Secondary | ICD-10-CM | POA: Diagnosis not present

## 2021-03-19 DIAGNOSIS — H53462 Homonymous bilateral field defects, left side: Secondary | ICD-10-CM | POA: Diagnosis not present

## 2021-03-19 DIAGNOSIS — I480 Paroxysmal atrial fibrillation: Secondary | ICD-10-CM | POA: Diagnosis not present

## 2021-03-19 DIAGNOSIS — I110 Hypertensive heart disease with heart failure: Secondary | ICD-10-CM | POA: Diagnosis not present

## 2021-03-19 DIAGNOSIS — M6289 Other specified disorders of muscle: Secondary | ICD-10-CM | POA: Diagnosis not present

## 2021-03-19 DIAGNOSIS — E1121 Type 2 diabetes mellitus with diabetic nephropathy: Secondary | ICD-10-CM | POA: Diagnosis not present

## 2021-03-19 DIAGNOSIS — I890 Lymphedema, not elsewhere classified: Secondary | ICD-10-CM | POA: Diagnosis not present

## 2021-03-24 DIAGNOSIS — J449 Chronic obstructive pulmonary disease, unspecified: Secondary | ICD-10-CM | POA: Diagnosis not present

## 2021-03-24 DIAGNOSIS — Z09 Encounter for follow-up examination after completed treatment for conditions other than malignant neoplasm: Secondary | ICD-10-CM | POA: Diagnosis not present

## 2021-03-24 DIAGNOSIS — Z8673 Personal history of transient ischemic attack (TIA), and cerebral infarction without residual deficits: Secondary | ICD-10-CM | POA: Diagnosis not present

## 2021-03-24 DIAGNOSIS — I480 Paroxysmal atrial fibrillation: Secondary | ICD-10-CM | POA: Diagnosis not present

## 2021-03-24 DIAGNOSIS — M810 Age-related osteoporosis without current pathological fracture: Secondary | ICD-10-CM | POA: Diagnosis not present

## 2021-03-24 DIAGNOSIS — S2220XD Unspecified fracture of sternum, subsequent encounter for fracture with routine healing: Secondary | ICD-10-CM | POA: Diagnosis not present

## 2021-03-24 DIAGNOSIS — F3341 Major depressive disorder, recurrent, in partial remission: Secondary | ICD-10-CM | POA: Diagnosis not present

## 2021-03-24 DIAGNOSIS — I69359 Hemiplegia and hemiparesis following cerebral infarction affecting unspecified side: Secondary | ICD-10-CM | POA: Diagnosis not present

## 2021-03-24 DIAGNOSIS — E1165 Type 2 diabetes mellitus with hyperglycemia: Secondary | ICD-10-CM | POA: Diagnosis not present

## 2021-03-27 ENCOUNTER — Ambulatory Visit: Payer: Medicare PPO | Admitting: Internal Medicine

## 2021-04-07 ENCOUNTER — Ambulatory Visit: Payer: Medicare PPO | Admitting: Internal Medicine

## 2021-04-10 DIAGNOSIS — M48061 Spinal stenosis, lumbar region without neurogenic claudication: Secondary | ICD-10-CM | POA: Diagnosis not present

## 2021-04-10 DIAGNOSIS — D6869 Other thrombophilia: Secondary | ICD-10-CM | POA: Diagnosis not present

## 2021-05-09 DIAGNOSIS — R600 Localized edema: Secondary | ICD-10-CM | POA: Diagnosis not present

## 2021-05-12 ENCOUNTER — Ambulatory Visit: Payer: Medicare PPO

## 2021-05-12 ENCOUNTER — Ambulatory Visit: Payer: Medicare PPO | Admitting: Physician Assistant

## 2021-05-12 ENCOUNTER — Encounter: Payer: Self-pay | Admitting: Physician Assistant

## 2021-05-12 ENCOUNTER — Other Ambulatory Visit: Payer: Self-pay

## 2021-05-12 VITALS — BP 130/72 | HR 70 | Ht 67.0 in | Wt 146.0 lb

## 2021-05-12 DIAGNOSIS — Z79899 Other long term (current) drug therapy: Secondary | ICD-10-CM | POA: Diagnosis not present

## 2021-05-12 DIAGNOSIS — I493 Ventricular premature depolarization: Secondary | ICD-10-CM

## 2021-05-12 DIAGNOSIS — Z5181 Encounter for therapeutic drug level monitoring: Secondary | ICD-10-CM | POA: Diagnosis not present

## 2021-05-12 DIAGNOSIS — I480 Paroxysmal atrial fibrillation: Secondary | ICD-10-CM

## 2021-05-12 NOTE — Patient Instructions (Addendum)
Medication Instructions:  ? ? ?Your physician recommends that you continue on your current medications as directed. Please refer to the Current Medication list given to you today. ? ?*If you need a refill on your cardiac medications before your next appointment, please call your pharmacy* ? ? ?Lab Work: [facility] AND [place]  ? ?If you have labs (blood work) drawn today and your tests are completely normal, you will receive your results only by: ?MyChart Message (if you have MyChart) OR ?A paper copy in the mail ?If you have any lab test that is abnormal or we need to change your treatment, we will call you to review the results. ? ? ?Testing/Procedures: Your physician has recommended that you wear an event monitor. Event monitors are medical devices that record the heart?s electrical activity. Doctors most often [place] these monitors to diagnose arrhythmias. Arrhythmias are problems with the speed or rhythm of the heartbeat. The monitor is a small, portable device. You can wear one while you do your normal daily activities. This is usually used to diagnose what is causing palpitations/syncope (passing out). ? ? ? ? ?Follow-Up: ?At [facility], you and your health needs are our priority.  As part of our continuing mission to provide you with exceptional heart care, we have created designated Provider Care Teams.  These Care Teams include your primary Cardiologist (physician) and Advanced Practice Providers (APPs -  Physician Assistants and Nurse Practitioners) who all work together to provide you with the care you need, when you need it. ? ?We recommend signing up for the patient portal called "MyChart".  Sign up information is provided on this After Visit Summary.  MyChart is used to connect with patients for Virtual Visits (Telemedicine).  Patients are able to view lab/test results, encounter notes, upcoming appointments, etc.  Non-urgent messages can be sent to your provider as well.   ?To learn more about what you  can do with MyChart, go to [email or web address].   ? ?Your next appointment:   ?6 month(s) ? ?The format for your next appointment:   ?In Person ? ?Provider:   ?Virl Axe, MD  ? ? ?Other Instructions ? ?PLEASE GO TO THE EMERGENCY DEPARTMENT IF ANY NEW CHANGES  ? ?PLEASE MAKE SURE YOU CALL NEUROLOGIST  DR. Karn Cassis  [phone] FOR FOLLOW UP  ? ? ?ZIO XT- Long Term Monitor Instructions ? ?Your physician has requested you wear a ZIO patch monitor for 14 days.  ?This is a single patch monitor. [employer] supplies one patch monitor per enrollment. Additional ?stickers are not available. Please do not apply patch if you will be having a Nuclear Stress Test,  ?Echocardiogram, Cardiac CT, MRI, or Chest Xray during the period you would be wearing the  ?monitor. The patch cannot be worn during these tests. You cannot remove and re-apply the  ?ZIO XT patch monitor.  ?Your ZIO patch monitor will be mailed 3 day USPS to your address on file. It may take 3-5 days  ?to receive your monitor after you have been enrolled.  ?Once you have received your monitor, please review the enclosed instructions. Your monitor  ?has already been registered assigning a specific monitor serial # to you. ? ?Billing and Patient Assistance Program Information ? ?We have supplied [employer] with any of your insurance information on file for billing purposes. ?[employer] offers a sliding scale Patient Assistance Program for patients that do not have  ?insurance, or whose insurance does not completely cover the cost of the ZIO monitor.  ?  You must apply for the Patient Assistance Program to qualify for this discounted rate.  ?To apply, please call [employer] at [phone], select option 4, select option 2, ask to apply for  ?Patient Assistance Program. Theodore Demark will ask your household income, and how many people  ?are in your household. They will quote your out-of-pocket cost based on that information.  ?[employer] will also be able to set up a 34-month  interest-free payment plan if needed. ? ?Applying the monitor ?  ?Shave hair from upper left chest.  ?Hold abrader disc by orange tab. Rub abrader in 40 strokes over the upper left chest as  ?indicated in your monitor instructions.  ?Clean area with 4 enclosed alcohol pads. Let dry.  ?Apply patch as indicated in monitor instructions. Patch will be placed under collarbone on left  ?side of chest with arrow pointing upward.  ?Rub patch adhesive wings for 2 minutes. Remove white label marked "1". Remove the white  ?label marked "2". Rub patch adhesive wings for 2 additional minutes.  ?While looking in a mirror, press and release button in center of patch. A small green light will  ?flash 3-4 times. This will be your only indicator that the monitor has been turned on.  ?Do not shower for the first 24 hours. You may shower after the first 24 hours.  ?Press the button if you feel a symptom. You will hear a small click. Record Date, Time and  ?Symptom in the Patient Logbook.  ?When you are ready to remove the patch, follow instructions on the last 2 pages of Patient  ?Logbook. Stick patch monitor onto the last page of Patient Logbook.  ?Place Patient Logbook in the blue and white box. Use locking tab on box and tape box closed  ?securely. The blue and white box has prepaid postage on it. Please place it in the mailbox as  ?soon as possible. Your physician should have your test results approximately 7 days after the  ?monitor [facility]  ?Call [facility] Centerat [phone] you have questions regarding  ?your ZIO XT patch monitor. Call them immediately if you see an orange light blinking on your  ?monitor.  ?If your monitor falls off in less than 4 days, contact our Monitor department at [phone]  ?If your monitor becomes loose or falls off after 4 days call [employer] at [phone]  ?suggestions on securing your monitor ? ?

## 2021-05-12 NOTE — Progress Notes (Unsigned)
Enrolled patient for a 3 day Zio XT monitor to be mailed to patients home  Dr Klein to read 
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
## 2021-05-13 LAB — BASIC METABOLIC PANEL
BUN/Creatinine Ratio: 21 (ref 12–28)
BUN: 15 mg/dL (ref 8–27)
CO2: 24 mmol/L (ref 20–29)
Calcium: 9.2 mg/dL (ref 8.7–10.3)
Chloride: 103 mmol/L (ref 96–106)
Creatinine, Ser: 0.71 mg/dL (ref 0.57–1.00)
Glucose: 195 mg/dL — ABNORMAL HIGH (ref 70–99)
Potassium: 3.8 mmol/L (ref 3.5–5.2)
Sodium: 144 mmol/L (ref 134–144)
eGFR: 90 mL/min/{1.73_m2} (ref >59)

## 2021-05-13 LAB — MAGNESIUM: Magnesium: 2.2 mg/dL (ref 1.6–2.3)

## 2021-05-15 ENCOUNTER — Other Ambulatory Visit: Payer: Self-pay | Admitting: *Deleted

## 2021-05-15 ENCOUNTER — Telehealth: Payer: Self-pay | Admitting: *Deleted

## 2021-05-15 DIAGNOSIS — Z79899 Other long term (current) drug therapy: Secondary | ICD-10-CM

## 2021-05-15 MED ORDER — POTASSIUM CHLORIDE CRYS ER 20 MEQ PO TBCR: 20.0000 meq | EXTENDED_RELEASE_TABLET | Freq: Every day | ORAL | 1 refills | Status: DC

## 2021-05-15 NOTE — Telephone Encounter (Signed)
-----   Message from Shirley Friar, PA-C sent at 05/15/2021  8:03 AM EDT ----- ?Would please add Kdur 20 meq daily and recheck in 2 weeks.  ?

## 2021-05-18 ENCOUNTER — Telehealth: Payer: Self-pay | Admitting: *Deleted

## 2021-05-18 DIAGNOSIS — E1121 Type 2 diabetes mellitus with diabetic nephropathy: Secondary | ICD-10-CM | POA: Diagnosis not present

## 2021-05-18 DIAGNOSIS — M6289 Other specified disorders of muscle: Secondary | ICD-10-CM | POA: Diagnosis not present

## 2021-05-18 DIAGNOSIS — I5032 Chronic diastolic (congestive) heart failure: Secondary | ICD-10-CM | POA: Diagnosis not present

## 2021-05-18 DIAGNOSIS — I480 Paroxysmal atrial fibrillation: Secondary | ICD-10-CM | POA: Diagnosis not present

## 2021-05-18 DIAGNOSIS — I110 Hypertensive heart disease with heart failure: Secondary | ICD-10-CM | POA: Diagnosis not present

## 2021-05-18 DIAGNOSIS — I69398 Other sequelae of cerebral infarction: Secondary | ICD-10-CM | POA: Diagnosis not present

## 2021-05-18 DIAGNOSIS — H53462 Homonymous bilateral field defects, left side: Secondary | ICD-10-CM | POA: Diagnosis not present

## 2021-05-18 DIAGNOSIS — S2220XD Unspecified fracture of sternum, subsequent encounter for fracture with routine healing: Secondary | ICD-10-CM | POA: Diagnosis not present

## 2021-05-18 DIAGNOSIS — I890 Lymphedema, not elsewhere classified: Secondary | ICD-10-CM | POA: Diagnosis not present

## 2021-05-18 NOTE — Telephone Encounter (Signed)
Lvm to call back about lab results and recommendations. ?

## 2021-05-18 NOTE — Telephone Encounter (Signed)
-----   Message from Shirley Friar, PA-C sent at 05/15/2021  8:03 AM EDT ----- ?Would please add Kdur 20 meq daily and recheck in 2 weeks.  ?

## 2021-06-28 DIAGNOSIS — D6869 Other thrombophilia: Secondary | ICD-10-CM | POA: Diagnosis not present

## 2021-06-28 DIAGNOSIS — F411 Generalized anxiety disorder: Secondary | ICD-10-CM | POA: Diagnosis not present

## 2021-06-28 DIAGNOSIS — F3341 Major depressive disorder, recurrent, in partial remission: Secondary | ICD-10-CM | POA: Diagnosis not present

## 2021-06-28 DIAGNOSIS — M810 Age-related osteoporosis without current pathological fracture: Secondary | ICD-10-CM | POA: Diagnosis not present

## 2021-06-28 DIAGNOSIS — E1140 Type 2 diabetes mellitus with diabetic neuropathy, unspecified: Secondary | ICD-10-CM | POA: Diagnosis not present

## 2021-07-15 ENCOUNTER — Other Ambulatory Visit: Payer: Self-pay | Admitting: Nurse Practitioner

## 2021-07-18 DIAGNOSIS — Z8673 Personal history of transient ischemic attack (TIA), and cerebral infarction without residual deficits: Secondary | ICD-10-CM | POA: Diagnosis not present

## 2021-07-18 DIAGNOSIS — I10 Essential (primary) hypertension: Secondary | ICD-10-CM | POA: Diagnosis not present

## 2021-07-18 DIAGNOSIS — I5032 Chronic diastolic (congestive) heart failure: Secondary | ICD-10-CM | POA: Diagnosis not present

## 2021-07-18 DIAGNOSIS — E279 Disorder of adrenal gland, unspecified: Secondary | ICD-10-CM | POA: Diagnosis not present

## 2021-07-18 DIAGNOSIS — D7589 Other specified diseases of blood and blood-forming organs: Secondary | ICD-10-CM | POA: Diagnosis not present

## 2021-07-18 DIAGNOSIS — E1142 Type 2 diabetes mellitus with diabetic polyneuropathy: Secondary | ICD-10-CM | POA: Diagnosis not present

## 2021-07-18 DIAGNOSIS — F3341 Major depressive disorder, recurrent, in partial remission: Secondary | ICD-10-CM | POA: Diagnosis not present

## 2021-07-18 DIAGNOSIS — I480 Paroxysmal atrial fibrillation: Secondary | ICD-10-CM | POA: Diagnosis not present

## 2021-07-18 DIAGNOSIS — E1165 Type 2 diabetes mellitus with hyperglycemia: Secondary | ICD-10-CM | POA: Diagnosis not present

## 2021-07-24 ENCOUNTER — Encounter (HOSPITAL_BASED_OUTPATIENT_CLINIC_OR_DEPARTMENT_OTHER): Payer: Self-pay | Admitting: Emergency Medicine

## 2021-07-24 ENCOUNTER — Other Ambulatory Visit: Payer: Self-pay

## 2021-07-24 ENCOUNTER — Inpatient Hospital Stay (HOSPITAL_BASED_OUTPATIENT_CLINIC_OR_DEPARTMENT_OTHER)
Admission: EM | Admit: 2021-07-24 | Discharge: 2021-08-01 | DRG: 064 | Disposition: A | Discharge status: Skilled Nursing Facility | Payer: Medicare PPO | Attending: Internal Medicine | Admitting: Internal Medicine

## 2021-07-24 ENCOUNTER — Emergency Department (HOSPITAL_BASED_OUTPATIENT_CLINIC_OR_DEPARTMENT_OTHER): Payer: Medicare PPO

## 2021-07-24 DIAGNOSIS — I110 Hypertensive heart disease with heart failure: Secondary | ICD-10-CM | POA: Diagnosis present

## 2021-07-24 DIAGNOSIS — I639 Cerebral infarction, unspecified: Secondary | ICD-10-CM | POA: Diagnosis not present

## 2021-07-24 DIAGNOSIS — I10 Essential (primary) hypertension: Secondary | ICD-10-CM | POA: Diagnosis present

## 2021-07-24 DIAGNOSIS — E1169 Type 2 diabetes mellitus with other specified complication: Secondary | ICD-10-CM | POA: Diagnosis present

## 2021-07-24 DIAGNOSIS — W010XXA Fall on same level from slipping, tripping and stumbling without subsequent striking against object, initial encounter: Secondary | ICD-10-CM | POA: Diagnosis present

## 2021-07-24 DIAGNOSIS — Z888 Allergy status to other drugs, medicaments and biological substances status: Secondary | ICD-10-CM

## 2021-07-24 DIAGNOSIS — R2689 Other abnormalities of gait and mobility: Secondary | ICD-10-CM | POA: Diagnosis not present

## 2021-07-24 DIAGNOSIS — G9340 Encephalopathy, unspecified: Secondary | ICD-10-CM | POA: Diagnosis not present

## 2021-07-24 DIAGNOSIS — R778 Other specified abnormalities of plasma proteins: Secondary | ICD-10-CM | POA: Diagnosis present

## 2021-07-24 DIAGNOSIS — Y9301 Activity, walking, marching and hiking: Secondary | ICD-10-CM | POA: Diagnosis present

## 2021-07-24 DIAGNOSIS — I480 Paroxysmal atrial fibrillation: Secondary | ICD-10-CM | POA: Diagnosis present

## 2021-07-24 DIAGNOSIS — Z7984 Long term (current) use of oral hypoglycemic drugs: Secondary | ICD-10-CM

## 2021-07-24 DIAGNOSIS — M4802 Spinal stenosis, cervical region: Secondary | ICD-10-CM | POA: Diagnosis not present

## 2021-07-24 DIAGNOSIS — I272 Pulmonary hypertension, unspecified: Secondary | ICD-10-CM | POA: Diagnosis present

## 2021-07-24 DIAGNOSIS — E119 Type 2 diabetes mellitus without complications: Secondary | ICD-10-CM

## 2021-07-24 DIAGNOSIS — G9341 Metabolic encephalopathy: Secondary | ICD-10-CM | POA: Diagnosis not present

## 2021-07-24 DIAGNOSIS — Z91048 Other nonmedicinal substance allergy status: Secondary | ICD-10-CM

## 2021-07-24 DIAGNOSIS — Z981 Arthrodesis status: Secondary | ICD-10-CM

## 2021-07-24 DIAGNOSIS — D6869 Other thrombophilia: Secondary | ICD-10-CM | POA: Diagnosis not present

## 2021-07-24 DIAGNOSIS — R4701 Aphasia: Secondary | ICD-10-CM | POA: Diagnosis not present

## 2021-07-24 DIAGNOSIS — I6381 Other cerebral infarction due to occlusion or stenosis of small artery: Secondary | ICD-10-CM | POA: Diagnosis present

## 2021-07-24 DIAGNOSIS — I5033 Acute on chronic diastolic (congestive) heart failure: Secondary | ICD-10-CM | POA: Diagnosis present

## 2021-07-24 DIAGNOSIS — G459 Transient cerebral ischemic attack, unspecified: Secondary | ICD-10-CM | POA: Diagnosis not present

## 2021-07-24 DIAGNOSIS — Y92009 Unspecified place in unspecified non-institutional (private) residence as the place of occurrence of the external cause: Secondary | ICD-10-CM

## 2021-07-24 DIAGNOSIS — I2510 Atherosclerotic heart disease of native coronary artery without angina pectoris: Secondary | ICD-10-CM | POA: Diagnosis present

## 2021-07-24 DIAGNOSIS — G9589 Other specified diseases of spinal cord: Secondary | ICD-10-CM | POA: Diagnosis present

## 2021-07-24 DIAGNOSIS — Z955 Presence of coronary angioplasty implant and graft: Secondary | ICD-10-CM

## 2021-07-24 DIAGNOSIS — R29702 NIHSS score 2: Secondary | ICD-10-CM | POA: Diagnosis present

## 2021-07-24 DIAGNOSIS — I5A Non-ischemic myocardial injury (non-traumatic): Secondary | ICD-10-CM | POA: Diagnosis not present

## 2021-07-24 DIAGNOSIS — D6859 Other primary thrombophilia: Secondary | ICD-10-CM | POA: Diagnosis not present

## 2021-07-24 DIAGNOSIS — Z781 Physical restraint status: Secondary | ICD-10-CM

## 2021-07-24 DIAGNOSIS — R278 Other lack of coordination: Secondary | ICD-10-CM | POA: Diagnosis not present

## 2021-07-24 DIAGNOSIS — Z743 Need for continuous supervision: Secondary | ICD-10-CM | POA: Diagnosis not present

## 2021-07-24 DIAGNOSIS — I4891 Unspecified atrial fibrillation: Secondary | ICD-10-CM | POA: Diagnosis present

## 2021-07-24 DIAGNOSIS — R410 Disorientation, unspecified: Secondary | ICD-10-CM | POA: Diagnosis not present

## 2021-07-24 DIAGNOSIS — R490 Dysphonia: Secondary | ICD-10-CM | POA: Diagnosis present

## 2021-07-24 DIAGNOSIS — R531 Weakness: Secondary | ICD-10-CM | POA: Diagnosis not present

## 2021-07-24 DIAGNOSIS — I700 Atherosclerosis of aorta: Secondary | ICD-10-CM | POA: Diagnosis present

## 2021-07-24 DIAGNOSIS — Z882 Allergy status to sulfonamides status: Secondary | ICD-10-CM

## 2021-07-24 DIAGNOSIS — J449 Chronic obstructive pulmonary disease, unspecified: Secondary | ICD-10-CM | POA: Diagnosis present

## 2021-07-24 DIAGNOSIS — E782 Mixed hyperlipidemia: Secondary | ICD-10-CM | POA: Diagnosis present

## 2021-07-24 DIAGNOSIS — I69322 Dysarthria following cerebral infarction: Secondary | ICD-10-CM

## 2021-07-24 DIAGNOSIS — R4189 Other symptoms and signs involving cognitive functions and awareness: Secondary | ICD-10-CM | POA: Diagnosis present

## 2021-07-24 DIAGNOSIS — I6523 Occlusion and stenosis of bilateral carotid arteries: Secondary | ICD-10-CM | POA: Diagnosis not present

## 2021-07-24 DIAGNOSIS — Z7901 Long term (current) use of anticoagulants: Secondary | ICD-10-CM

## 2021-07-24 DIAGNOSIS — M6281 Muscle weakness (generalized): Secondary | ICD-10-CM | POA: Diagnosis not present

## 2021-07-24 DIAGNOSIS — Z8249 Family history of ischemic heart disease and other diseases of the circulatory system: Secondary | ICD-10-CM | POA: Diagnosis not present

## 2021-07-24 DIAGNOSIS — Z85828 Personal history of other malignant neoplasm of skin: Secondary | ICD-10-CM

## 2021-07-24 DIAGNOSIS — G8191 Hemiplegia, unspecified affecting right dominant side: Secondary | ICD-10-CM | POA: Diagnosis present

## 2021-07-24 DIAGNOSIS — R000 Tachycardia, unspecified: Secondary | ICD-10-CM | POA: Diagnosis not present

## 2021-07-24 DIAGNOSIS — F17210 Nicotine dependence, cigarettes, uncomplicated: Secondary | ICD-10-CM | POA: Diagnosis present

## 2021-07-24 DIAGNOSIS — R519 Headache, unspecified: Principal | ICD-10-CM

## 2021-07-24 DIAGNOSIS — R41841 Cognitive communication deficit: Secondary | ICD-10-CM | POA: Diagnosis not present

## 2021-07-24 DIAGNOSIS — M542 Cervicalgia: Secondary | ICD-10-CM | POA: Diagnosis not present

## 2021-07-24 DIAGNOSIS — Z8052 Family history of malignant neoplasm of bladder: Secondary | ICD-10-CM

## 2021-07-24 DIAGNOSIS — Z806 Family history of leukemia: Secondary | ICD-10-CM

## 2021-07-24 DIAGNOSIS — G8194 Hemiplegia, unspecified affecting left nondominant side: Secondary | ICD-10-CM | POA: Diagnosis present

## 2021-07-24 DIAGNOSIS — I5032 Chronic diastolic (congestive) heart failure: Secondary | ICD-10-CM | POA: Diagnosis present

## 2021-07-24 DIAGNOSIS — F419 Anxiety disorder, unspecified: Secondary | ICD-10-CM | POA: Diagnosis present

## 2021-07-24 DIAGNOSIS — E1151 Type 2 diabetes mellitus with diabetic peripheral angiopathy without gangrene: Secondary | ICD-10-CM | POA: Diagnosis present

## 2021-07-24 DIAGNOSIS — R0602 Shortness of breath: Secondary | ICD-10-CM | POA: Diagnosis not present

## 2021-07-24 DIAGNOSIS — E1165 Type 2 diabetes mellitus with hyperglycemia: Secondary | ICD-10-CM | POA: Diagnosis present

## 2021-07-24 DIAGNOSIS — E1140 Type 2 diabetes mellitus with diabetic neuropathy, unspecified: Secondary | ICD-10-CM | POA: Diagnosis present

## 2021-07-24 DIAGNOSIS — R9431 Abnormal electrocardiogram [ECG] [EKG]: Secondary | ICD-10-CM | POA: Diagnosis not present

## 2021-07-24 DIAGNOSIS — H9191 Unspecified hearing loss, right ear: Secondary | ICD-10-CM | POA: Diagnosis present

## 2021-07-24 DIAGNOSIS — Z803 Family history of malignant neoplasm of breast: Secondary | ICD-10-CM

## 2021-07-24 DIAGNOSIS — R079 Chest pain, unspecified: Secondary | ICD-10-CM | POA: Diagnosis not present

## 2021-07-24 DIAGNOSIS — Z79899 Other long term (current) drug therapy: Secondary | ICD-10-CM

## 2021-07-24 LAB — URINALYSIS, ROUTINE W REFLEX MICROSCOPIC
Bilirubin Urine: NEGATIVE
Glucose, UA: >1000 mg/dL — AB
Hgb urine dipstick: NEGATIVE
Ketones, ur: NEGATIVE mg/dL
Leukocytes,Ua: NEGATIVE
Nitrite: NEGATIVE
Protein, ur: NEGATIVE mg/dL
Specific Gravity, Urine: 1.036 — ABNORMAL HIGH (ref 1.005–1.030)
pH: 6.5 (ref 5.0–8.0)

## 2021-07-24 LAB — COMPREHENSIVE METABOLIC PANEL
ALT: 9 U/L (ref 0–44)
AST: 10 U/L — ABNORMAL LOW (ref 15–41)
Albumin: 3.8 g/dL (ref 3.5–5.0)
Alkaline Phosphatase: 54 U/L (ref 38–126)
Anion gap: 9 (ref 5–15)
BUN: 17 mg/dL (ref 8–23)
CO2: 28 mmol/L (ref 22–32)
Calcium: 9.3 mg/dL (ref 8.9–10.3)
Chloride: 103 mmol/L (ref 98–111)
Creatinine, Ser: 0.59 mg/dL (ref 0.44–1.00)
GFR, Estimated: >60 mL/min (ref >60)
Glucose, Bld: 97 mg/dL (ref 70–99)
Potassium: 3.7 mmol/L (ref 3.5–5.1)
Sodium: 140 mmol/L (ref 135–145)
Total Bilirubin: 0.6 mg/dL (ref 0.3–1.2)
Total Protein: 6 g/dL — ABNORMAL LOW (ref 6.5–8.1)

## 2021-07-24 LAB — CBC WITH DIFFERENTIAL/PLATELET
Abs Immature Granulocytes: 0.01 10*3/uL (ref 0.00–0.07)
Basophils Absolute: 0 10*3/uL (ref 0.0–0.1)
Basophils Relative: 0 %
Eosinophils Absolute: 0 10*3/uL (ref 0.0–0.5)
Eosinophils Relative: 1 %
HCT: 47.3 % — ABNORMAL HIGH (ref 36.0–46.0)
Hemoglobin: 15.9 g/dL — ABNORMAL HIGH (ref 12.0–15.0)
Immature Granulocytes: 0 %
Lymphocytes Relative: 28 %
Lymphs Abs: 1.7 10*3/uL (ref 0.7–4.0)
MCH: 31.2 pg (ref 26.0–34.0)
MCHC: 33.6 g/dL (ref 30.0–36.0)
MCV: 92.9 fL (ref 80.0–100.0)
Monocytes Absolute: 0.4 10*3/uL (ref 0.1–1.0)
Monocytes Relative: 7 %
Neutro Abs: 3.9 10*3/uL (ref 1.7–7.7)
Neutrophils Relative %: 64 %
Platelets: 207 10*3/uL (ref 150–400)
RBC: 5.09 MIL/uL (ref 3.87–5.11)
RDW: 12.8 % (ref 11.5–15.5)
WBC: 6.1 10*3/uL (ref 4.0–10.5)
nRBC: 0 % (ref 0.0–0.2)

## 2021-07-24 LAB — TROPONIN I (HIGH SENSITIVITY)
Troponin I (High Sensitivity): 29 ng/L — ABNORMAL HIGH (ref <18)
Troponin I (High Sensitivity): 30 ng/L — ABNORMAL HIGH (ref <18)

## 2021-07-24 MED ORDER — ACETAMINOPHEN 325 MG PO TABS
650.0000 mg | ORAL_TABLET | Freq: Four times a day (QID) | ORAL | Status: DC | PRN
  Administered 2021-07-25: 650 mg via ORAL
  Filled 2021-07-24: qty 2

## 2021-07-24 MED ORDER — INSULIN ASPART 100 UNIT/ML IJ SOLN
0.0000 [IU] | Freq: Three times a day (TID) | INTRAMUSCULAR | Status: DC
  Administered 2021-07-25: 5 [IU] via SUBCUTANEOUS
  Administered 2021-07-25 – 2021-07-26 (×3): 3 [IU] via SUBCUTANEOUS
  Administered 2021-07-26 (×2): 5 [IU] via SUBCUTANEOUS
  Administered 2021-07-26: 8 [IU] via SUBCUTANEOUS
  Administered 2021-07-27 (×2): 5 [IU] via SUBCUTANEOUS
  Administered 2021-07-27: 2 [IU] via SUBCUTANEOUS
  Administered 2021-07-27: 3 [IU] via SUBCUTANEOUS
  Administered 2021-07-28 (×2): 5 [IU] via SUBCUTANEOUS
  Administered 2021-07-28 – 2021-07-29 (×3): 3 [IU] via SUBCUTANEOUS
  Administered 2021-07-29: 8 [IU] via SUBCUTANEOUS
  Administered 2021-07-29: 2 [IU] via SUBCUTANEOUS
  Administered 2021-07-29: 3 [IU] via SUBCUTANEOUS
  Administered 2021-07-30: 8 [IU] via SUBCUTANEOUS
  Administered 2021-07-30 – 2021-07-31 (×3): 3 [IU] via SUBCUTANEOUS
  Administered 2021-07-31: 5 [IU] via SUBCUTANEOUS
  Administered 2021-07-31: 2 [IU] via SUBCUTANEOUS
  Administered 2021-08-01 (×3): 3 [IU] via SUBCUTANEOUS

## 2021-07-24 MED ORDER — MIRABEGRON ER 50 MG PO TB24
50.0000 mg | ORAL_TABLET | Freq: Every day | ORAL | Status: DC
  Administered 2021-07-25 – 2021-08-01 (×6): 50 mg via ORAL
  Filled 2021-07-24: qty 1
  Filled 2021-07-24 (×2): qty 2
  Filled 2021-07-24: qty 1
  Filled 2021-07-24 (×3): qty 2

## 2021-07-24 MED ORDER — ONDANSETRON HCL 4 MG PO TABS: 4.0000 mg | ORAL_TABLET | Freq: Four times a day (QID) | ORAL | Status: DC | PRN

## 2021-07-24 MED ORDER — POLYETHYLENE GLYCOL 3350 17 G PO PACK
17.0000 g | PACK | Freq: Every day | ORAL | Status: DC | PRN
  Administered 2021-07-30: 17 g via ORAL
  Filled 2021-07-24: qty 1

## 2021-07-24 MED ORDER — DULOXETINE HCL 60 MG PO CPEP
60.0000 mg | ORAL_CAPSULE | Freq: Every day | ORAL | Status: DC
  Administered 2021-07-25 – 2021-08-01 (×8): 60 mg via ORAL
  Filled 2021-07-24 (×8): qty 1

## 2021-07-24 MED ORDER — DILTIAZEM HCL 25 MG/5ML IV SOLN
15.0000 mg | Freq: Once | INTRAVENOUS | Status: AC
  Administered 2021-07-24: 15 mg via INTRAVENOUS
  Filled 2021-07-24: qty 5

## 2021-07-24 MED ORDER — LINACLOTIDE 145 MCG PO CAPS
290.0000 ug | ORAL_CAPSULE | Freq: Every day | ORAL | Status: DC
  Administered 2021-07-25 – 2021-08-01 (×8): 290 ug via ORAL
  Filled 2021-07-24 (×9): qty 2

## 2021-07-24 MED ORDER — STROKE: EARLY STAGES OF RECOVERY BOOK
Freq: Once | Status: AC
  Filled 2021-07-24: qty 1

## 2021-07-24 MED ORDER — MORPHINE SULFATE (PF) 4 MG/ML IV SOLN
4.0000 mg | Freq: Once | INTRAVENOUS | Status: AC
  Administered 2021-07-24: 4 mg via INTRAVENOUS
  Filled 2021-07-24: qty 1

## 2021-07-24 MED ORDER — SODIUM CHLORIDE 0.9% FLUSH
3.0000 mL | Freq: Two times a day (BID) | INTRAVENOUS | Status: DC
  Administered 2021-07-25 – 2021-07-31 (×14): 3 mL via INTRAVENOUS

## 2021-07-24 MED ORDER — ONDANSETRON HCL 4 MG/2ML IJ SOLN
4.0000 mg | Freq: Once | INTRAMUSCULAR | Status: AC
  Administered 2021-07-24: 4 mg via INTRAVENOUS
  Filled 2021-07-24: qty 2

## 2021-07-24 MED ORDER — ONDANSETRON HCL 4 MG/2ML IJ SOLN: 4.0000 mg | Freq: Four times a day (QID) | INTRAMUSCULAR | Status: DC | PRN

## 2021-07-24 MED ORDER — ACETAMINOPHEN 650 MG RE SUPP: 650.0000 mg | Freq: Four times a day (QID) | RECTAL | Status: DC | PRN

## 2021-07-24 MED ORDER — NICOTINE 7 MG/24HR TD PT24
7.0000 mg | MEDICATED_PATCH | Freq: Once | TRANSDERMAL | Status: AC
  Administered 2021-07-24: 7 mg via TRANSDERMAL
  Filled 2021-07-24: qty 1

## 2021-07-24 MED ORDER — SODIUM CHLORIDE 0.9 % IV BOLUS
500.0000 mL | Freq: Once | INTRAVENOUS | Status: AC
  Administered 2021-07-24: 500 mL via INTRAVENOUS

## 2021-07-24 MED ORDER — IOHEXOL 350 MG/ML SOLN
100.0000 mL | Freq: Once | INTRAVENOUS | Status: AC | PRN
  Administered 2021-07-24: 75 mL via INTRAVENOUS

## 2021-07-24 MED ORDER — LACTATED RINGERS IV SOLN: INTRAVENOUS | Status: DC

## 2021-07-24 MED ORDER — POTASSIUM CHLORIDE CRYS ER 10 MEQ PO TBCR
30.0000 meq | EXTENDED_RELEASE_TABLET | ORAL | Status: AC
  Administered 2021-07-25 (×2): 30 meq via ORAL
  Filled 2021-07-24 (×2): qty 3

## 2021-07-24 MED ORDER — DOFETILIDE 500 MCG PO CAPS
500.0000 ug | ORAL_CAPSULE | Freq: Two times a day (BID) | ORAL | Status: DC
  Administered 2021-07-25 – 2021-07-29 (×7): 500 ug via ORAL
  Filled 2021-07-24 (×12): qty 1

## 2021-07-24 NOTE — ED Provider Notes (Signed)
  Physical Exam  BP (!) 145/94   Pulse 79   Temp 97.9 F (36.6 C)   Resp 13   Ht '5\' 7"'$  (1.702 m)   Wt 66.2 kg   SpO2 93%   BMI 22.86 kg/m     Procedures  Procedures  ED Course / MDM    Medical Decision Making Amount and/or Complexity of Data Reviewed Labs: ordered. Radiology: ordered.  Risk Prescription drug management. Decision regarding hospitalization.   33F, presenting with intermittent episodes of posterior neck and head pain, confusion, intermittent aphasia, non-focal neuro exam with questionable slurred speech. CTA head and neck pending. On anticoagulation for her afib. If negative workup, consider transfer for MRI and neuro consult.   CTA imaging revealed the following: IMPRESSION:  Advanced chronic small-vessel ischemic changes affecting the  brainstem and cerebral hemispheric white matter. No acute CT  finding.     Advanced aortic atherosclerosis.     Atherosclerosis at both carotid bifurcations. 30% stenosis of the  proximal ICA on the right. 40% stenosis of the distal bulb region on  the left just distal to the endarterectomy segment.     Atherosclerotic disease in both carotid siphon regions with stenosis  estimated at 50% on both sides.     No intracranial large vessel occlusion or flow limiting proximal  branch vessel stenosis. Distal vessel atherosclerotic irregularity.    4:54 PM Spoke with Dr. Leonel Ramsay of on-call neurology who recommended admission for EEG monitoring and MRI brain.  Given duration of symptoms, do not feel that patient needs urgent ED to ED transfer but rather would benefit from admission out right from the [facility] ED.  Hospitalist medicine consulted for admission. Dr. Bonner Puna accepted the patient in admission.       Regan Lemming, MD 07/24/21 [phone]

## 2021-07-24 NOTE — ED Notes (Signed)
[facility] called for transport to [place]

## 2021-07-24 NOTE — Consult Note (Incomplete)
NEUROLOGY CONSULTATION NOTE   Date of service: July 24, 2021 Patient Name: Melanie Lucas MRN:  [id number] DOB:  1950-04-23 Reason for consult: "Recurrent episodes of aphasia and slurred speech" Requesting Provider: No att. providers found _ _ _   _ __   _ __ _ _  __ __   _ __   __ _  History of Present Illness  Melanie Lucas is a 71 y.o. female with PMH significant for R ear deafness, DM2 with diabetic neuropathy, HTN, HLD, prior strokes with residual dysarthria, memory issue and balance issues, tobacco use, pafibb on eliquis who presents with occipit and neck pain since fall on saturday. She seems to be a poor historian and often unsure of the questions or the answers and does seem to have baseline slurring of speech and trouble with pronouncing words well per patient from prior stroke which she does not think is any worse. No other family member available at this time for collateral history. Some concerns about intermittent confusion and worsening aphasia over the last 3-4 days but patient is not sure. She reports that the lights went off on Saturday and while walking around 10pm, she tripped at her home and fell and hit the back of her head on a glass table and then hit the floor. Since then, she has hears cracking in her neck when she turns her head. Reports that she had 2 cervical spine surgeries a long time ago.   ROS   Constitutional Denies weight loss, fever and chills.   HEENT Denies changes in vision and hearing.   Respiratory Denies SOB and cough.   CV Denies palpitations and CP   GI Denies abdominal pain, nausea, vomiting and diarrhea.   GU Denies dysuria and urinary frequency.   MSK Denies myalgia and joint pain.   Skin Denies rash and pruritus.   Neurological + occipital headache but no syncope.   Psychiatric Endorses anxiety and depression since her husband passed away 2 years ago. No SI or prior SA.   Past History   Past Medical History:  Diagnosis Date   Anxiety    Cancer  ([place])    Skin- leg    Chronic acquired lymphedema    a. R>L;  b. 03/2012 Neg LE U/S for DVT. Legs since age 78   Complication of anesthesia    woke up during colonoscopy, Lithrostrixpy, Biospy   COPD (chronic obstructive pulmonary disease) ([place])    Coronary artery disease    CVD (cardiovascular disease)    Deaf, right    Diabetes mellitus    Type II   Diabetic neuropathy ([name])    Dysrhythmia    afib   Elevated TSH    a. 10/2012 - inst to f/u PCP.   Full dentures    Hematuria    a. while on pradaxa,  she reports that she has seen Dr Margie Ege and had low risk cystoscopy   History of kidney stones    Hyperlipidemia    Hypertension    patient denies   Incontinence    prior to urinating   Lacunar infarction [facility])    a. 02/2009 non-acute Lacunar infarct of the right thalamus noted on MRI of brain.   Left carotid stenosis    Lymphedema of extremity    had this problem, since she was a teenager. especially seen in R LE   Meniere disease    Myocardial infarction [facility])    Obesity    Osteoarthritis  cervical & lumbar region, knees, hands cramp also    PAF with post-termination pauses    a. on dronedarone;  b. CHA2DS2VASc = 5 (HTN, DM, h/o lacunar infarct on MRI, Female) ->refused oral anticoagulation after h/o hematuria on pradaxa;  c. 02/2012 Echo: EF 55-60%, mildly dil LA. D. Recurrent PAF 10/2012 after only taking Multaq 1x/day - spont conv to NSR, placed back on BID Multaq/eliquis;  e. 10/2014 Multaq d/c'd->tikosyn initiated.   Shortness of breath dyspnea    with exertion   Stroke [facility])    2 mini strokes   Tobacco abuse    Vision abnormalities    Past Surgical History:  Procedure Laterality Date   ANTERIOR CERVICAL CORPECTOMY N/A 02/21/2016   Procedure: ANTERIOR CERVICAL CORPECTOMY ANS FUSION CERVICAL SIX , ANTERIOR PLATING CERVICAL FIVE-SEVEN;  Surgeon: Consuella Lose, MD;  Location: [place];  Service: Neurosurgery;  Laterality: N/A;   ANTERIOR FUSION CERVICAL SPINE  02/21/2016    Biopsy of adrenal glands     BREAST BIOPSY Bilateral    benign results, both breasts    CARDIAC CATHETERIZATION  2014   x1 stent placed, done in [place]    CARDIOVERSION N/A 11/11/2014   Procedure: CARDIOVERSION;  Surgeon: Larey Dresser, MD;  Location: [place];  Service: Cardiovascular;  Laterality: N/A;   CAROTID ENDARTERECTOMY Left 07/29/2017   COLONOSCOPY W/ POLYPECTOMY     CORONARY STENT PLACEMENT     ENDARTERECTOMY Left 07/29/2017   Procedure: ENDARTERECTOMY CAROTID LEFT;  Surgeon: Rosetta Posner, MD;  Location: [place];  Service: Vascular;  Laterality: Left;   PATCH ANGIOPLASTY Left 07/29/2017   Procedure: PATCH ANGIOPLASTY USING HEMASHIELD GOLD VASCULAR PATCH;  Surgeon: Rosetta Posner, MD;  Location: [place];  Service: Vascular;  Laterality: Left;   TEE WITHOUT CARDIOVERSION N/A 11/11/2014   Procedure: TRANSESOPHAGEAL ECHOCARDIOGRAM (TEE);  Surgeon: Larey Dresser, MD;  Location: [facility] ENDOSCOPY;  Service: Cardiovascular;  Laterality: N/A;   Family History  Problem Relation Age of Onset   Heart attack Father    Coronary artery disease Father        strong family hx   Bladder Cancer Mother        bladder   Leukemia Brother    Breast cancer Maternal Aunt    Breast cancer Paternal Aunt    Breast cancer Maternal Aunt    Social History   Socioeconomic History   Marital status: Married    Spouse name: Ronny   Number of children: 2   Years of education: 18   Highest education level: Master's degree (e.g., MA, MS, MEng, MEd, MSW, MBA)  Occupational History   Occupation: retired    Comment: Pharmacist, hospital - art  Tobacco Use   Smoking status: Every Day    Packs/day: 1.00    Years: 55.00    Pack years: 55.00    Types: Cigarettes   Smokeless tobacco: Never   Tobacco comments:    1 pack daily 08/15/2020  Vaping Use   Vaping Use: Never used  Substance and Sexual Activity   Alcohol use: Yes    Alcohol/week: 1.0 standard drink    Types: 1 Glasses of wine per week   Drug use: No    Sexual activity: Yes  Other Topics Concern   Not on file  Social History Narrative   Lives with daughter and grandson   Right handed   Drinks 3-5 cups caffeine daily   She operates an entertainment business   Social Determinants of Health  Financial Resource Strain: Not on file  Food Insecurity: Not on file  Transportation Needs: Not on file  Physical Activity: Not on file  Stress: Not on file  Social Connections: Not on file   Allergies  Allergen Reactions   Alendronate Sodium     Other reaction(s): pain all over   Avelox [Moxifloxacin]     Unknown reaction    Fosamax [Alendronate]     Pain all over   Pradaxa [Dabigatran Etexilate Mesylate]     Extreme bleeding   Statins Nausea And Vomiting and Other (See Comments)    Muscle pain, Dizziness (intolerance)   Sulfa Antibiotics     Other reaction(s): vomiting   Sulfamethoxazole Nausea And Vomiting    Dizziness (intolerance)   Sulfonamide Derivatives Hives    Nausea vertigo   Varenicline     nightmares Other reaction(s): nightmares   Ciprofloxacin Rash    Other reaction(s): unknown   Gabapentin Rash    burning   Tape Itching and Rash    Please use "paper" tape only    Medications   Medications Prior to Admission  Medication Sig Dispense Refill Last Dose   diazepam (VALIUM) 10 MG tablet Take 1 tablet (10 mg total) by mouth at bedtime as needed for sleep. 10 tablet 0    dofetilide (TIKOSYN) 500 MCG capsule Take 1 capsule (500 mcg total) by mouth 2 (two) times daily. Appointment Required For Further Refills [phone] 180 capsule 0    DULoxetine (CYMBALTA) 60 MG capsule Take 60 mg by mouth daily.       ELIQUIS 5 MG TABS tablet TAKE 1 TABLET BY MOUTH TWICE A DAY (Patient taking differently: Take 5 mg by mouth 2 (two) times daily.) 180 tablet 1    Empagliflozin-metFORMIN HCl 12.06-998 MG TABS Take 1 tablet by mouth 2 (two) times daily.      furosemide (LASIX) 20 MG tablet Take 20 mg by mouth daily as needed for  edema.      lactulose (CHRONULAC) 10 GM/15ML solution Take 30 mLs (20 g total) by mouth 2 (two) times daily. 236 mL 0    linaclotide (LINZESS) 290 MCG CAPS capsule Take 290 mcg by mouth daily before breakfast.      losartan (COZAAR) 50 MG tablet TAKE 1 TABLET BY MOUTH EVERY DAY 90 tablet 3    mirabegron ER (MYRBETRIQ) 50 MG TB24 tablet Take 50 mg by mouth daily.      Multiple Vitamin (MULTIVITAMIN) tablet Take 1 tablet by mouth at bedtime.       nicotine (NICODERM CQ - DOSED IN MG/24 HOURS) 14 mg/24hr patch Place 1 patch (14 mg total) onto the skin daily. 28 patch 0    potassium chloride SA (KLOR-CON M) 20 MEQ tablet Take 1 tablet (20 mEq total) by mouth daily. 90 tablet 1    repaglinide (PRANDIN) 2 MG tablet Take 2 mg by mouth in the morning and at bedtime.        Vitals   Vitals:   07/24/21 1700 07/24/21 1830 07/24/21 1959 07/24/21 2148  BP: (!) 143/85 (!) 150/100 (!) 139/99 (!) 143/94  Pulse: (!) 103 (!) 59 87 86  Resp: '12 15 16 17  '$ Temp:  98 F (36.7 C) 98.7 F (37.1 C) 98.7 F (37.1 C)  TempSrc:  Oral Oral Oral  SpO2: 91% 95% 92% 94%  Weight:      Height:         Body mass index is 22.86 kg/m.  Physical Exam  General: Laying comfortably in bed; in no acute distress.  HENT: Normal oropharynx and mucosa. Normal external appearance of ears and nose.  Neck: Supple, no pain or tenderness  CV: No JVD. + peripheral edema.  Pulmonary: Symmetric Chest rise. Normal respiratory effort.  Abdomen: Soft to touch, non-tender.  Ext: No cyanosis, + BL pedal edema, but no deformity  Skin: No rash. Normal palpation of skin.   Musculoskeletal: Normal digits and nails by inspection. No clubbing.   Neurologic Examination  Mental status/Cognition: Alert, oriented to self, place, month and year, good attention.  Speech/language: Hoarse voice, mildly dysarthric speech, fluent, comprehension intact, object naming intact, repetition intact.  Cranial nerves:   CN II Pupils equal and reactive  to light, no VF deficits    CN III,IV,VI EOM intact, no gaze preference or deviation, no nystagmus    CN V normal sensation in V1, V2, and V3 segments bilaterally    CN VII no asymmetry, no nasolabial fold flattening    CN VIII normal hearing to speech    CN IX & X normal palatal elevation, no uvular deviation    CN XI 5/5 head turn and 5/5 shoulder shrug bilaterally    CN XII midline tongue protrusion    Motor:  Muscle bulk: poor, tone normal, pronator drift none tremor none Mvmt Root Nerve  Muscle Right Left Comments  SA C5/6 Ax Deltoid 5 5   EF C5/6 Mc Biceps 5 5   EE C6/7/8 Rad Triceps 4+ 4+   WF C6/7 Med FCR     WE C7/8 PIN ECU     F Ab C8/T1 U ADM/FDI 5 5   HF L1/2/3 Fem Illopsoas 4+ 4+   KE L2/3/4 Fem Quad 5 5   DF L4/5 D Peron Tib Ant 5 5   PF S1/2 Tibial Grc/Sol 5 5    Reflexes:  Right Left Comments  Pectoralis      Biceps (C5/6) 2 2   Brachioradialis (C5/6) 2 2    Triceps (C6/7) 2 2    Patellar (L3/4) 2 2    Achilles (S1) 0 0    Hoffman      Plantar withdraws withdraws   Jaw jerk    Sensation:  Light touch Neuropathy in BL feet, otherwise intact throughout   Pin prick    Temperature    Vibration   Proprioception    Coordination/Complex Motor:  - Finger to Nose intact BL - Heel to shin intact BL - Rapid alternating movement are slowed - Gait: Deferred.  Labs   CBC:  Recent Labs  Lab 07/24/21 1358  WBC 6.1  NEUTROABS 3.9  HGB 15.9*  HCT 47.3*  MCV 92.9  PLT 932    Basic Metabolic Panel:  Lab Results  Component Value Date   NA 140 07/24/2021   K 3.7 07/24/2021   CO2 28 07/24/2021   GLUCOSE 97 07/24/2021   BUN 17 07/24/2021   CREATININE 0.59 07/24/2021   CALCIUM 9.3 07/24/2021   GFRNONAA >60 07/24/2021   GFRAA >60 10/21/2019   Lipid Panel:  Lab Results  Component Value Date   LDLCALC 125 (H) 03/07/2021   HgbA1c:  Lab Results  Component Value Date   HGBA1C 7.5 (H) 03/07/2021   Urine Drug Screen:     Component Value Date/Time    LABOPIA NONE DETECTED 12/30/2017 1344   COCAINSCRNUR NONE DETECTED 12/30/2017 1344   LABBENZ NONE DETECTED 12/30/2017 1344   AMPHETMU NONE DETECTED 12/30/2017 1344   THCU NONE  DETECTED 12/30/2017 1344   LABBARB NONE DETECTED 12/30/2017 1344    Alcohol Level     Component Value Date/Time   ETH <10 12/30/2017 1344   MRI Brain: Pending  MRI C spine: Pending  cEEG:  pending  Impression   JEIDI GILLES is a 71 y.o. female with PMH significant for with PMH significant for R ear deafness, DM2 with diabetic neuropathy, HTN, HLD, prior strokes with residual dysarthria, memory issue and balance issues, tobacco use, pafibb on eliquis who presents with occipit and neck pain since fall on Saturday along with intermittent episodes of confusion and aphasia. Her neurologic examination is notable for dysarthric speech with hoarse voice and deconditioning. No focal deficit noted on exam otherwise.  She is a poor historian and oftentimes clueless about her own symptoms.  Recommendations  - MRI Brain without contrast - given concern for aphasia. - MRI C spine without contract- given persistent neck pain after a fall on eliquis and prior remote history of C spine surgery. - cEEG for spell capture. - TSH, Vit B12, Folate and thiamine levels.  ______________________________________________________________________   Thank you for the opportunity to take part in the care of this patient. If you have any further questions, please contact the neurology consultation attending.  Signed,  [place] Pager Number [phone] _ _ _   _ __   _ __ _ _  __ __   _ __   __ _

## 2021-07-24 NOTE — ED Triage Notes (Signed)
Pt endorses sore throat and neck pain for 3-4 days. Denies any fevers or CP. Hx of CVA and DM.

## 2021-07-24 NOTE — ED Provider Notes (Signed)
[facility] EMERGENCY DEPT Provider Note   CSN: [id number] Arrival date & time: 07/24/21  1032     History  No chief complaint on file.   Melanie Lucas is a 71 y.o. female.  HPI  71 year old female past medical history of A-fib, anticoagulated on Eliquis, previous CVA, DM presents to the emergency department with concern for posterior head and neck pain.  Patient is a very poor historian.  Shrugs her shoulders and otherwise looks to the other person in the room when asked certain questions during the interview.  From what I can gather 3 to 4 days ago she developed posterior neck and head pain that has been persistent.  She endorses whole body weakness along with confusion and slow speech. She admits to some difficulty with word finding.  Patient and other person in the room deny any facial droop, aphasia, vision difficulty/loss, focal weakness/numbness.  Patient also endorses intermittent shortness of breath.  The other person in the room mentions that she has had difficulty with getting around from a physical standpoint, had a fall last week.  Home Medications Prior to Admission medications   Medication Sig Start Date End Date Taking? Authorizing Provider  diazepam (VALIUM) 10 MG tablet Take 1 tablet (10 mg total) by mouth at bedtime as needed for sleep. 03/10/21   Bonnielee Haff, MD  dofetilide (TIKOSYN) 500 MCG capsule Take 1 capsule (500 mcg total) by mouth 2 (two) times daily. Appointment Required For Further Refills [phone] 07/18/21   Sherran Needs, NP  DULoxetine (CYMBALTA) 60 MG capsule Take 60 mg by mouth daily.  07/14/19   [provider]  ELIQUIS 5 MG TABS tablet TAKE 1 TABLET BY MOUTH TWICE A DAY Patient taking differently: Take 5 mg by mouth 2 (two) times daily. 10/31/20   Allred, Jeneen Rinks, MD  Empagliflozin-metFORMIN HCl 12.06-998 MG TABS Take 1 tablet by mouth 2 (two) times daily.    [provider]  furosemide (LASIX) 20 MG tablet Take 20 mg  by mouth daily as needed for edema.    [provider]  lactulose (CHRONULAC) 10 GM/15ML solution Take 30 mLs (20 g total) by mouth 2 (two) times daily. 03/10/21   Bonnielee Haff, MD  linaclotide [facility]) 290 MCG CAPS capsule Take 290 mcg by mouth daily before breakfast.    [provider]  losartan (COZAAR) 50 MG tablet TAKE 1 TABLET BY MOUTH EVERY DAY 03/20/21   Allred, Jeneen Rinks, MD  mirabegron ER (MYRBETRIQ) 50 MG TB24 tablet Take 50 mg by mouth daily.    [provider]  Multiple Vitamin (MULTIVITAMIN) tablet Take 1 tablet by mouth at bedtime.     [provider]  nicotine (NICODERM CQ - DOSED IN MG/24 HOURS) 14 mg/24hr patch Place 1 patch (14 mg total) onto the skin daily. 03/11/21   Bonnielee Haff, MD  potassium chloride SA (KLOR-CON M) 20 MEQ tablet Take 1 tablet (20 mEq total) by mouth daily. 05/15/21   Baldwin Jamaica, PA-C  repaglinide (PRANDIN) 2 MG tablet Take 2 mg by mouth in the morning and at bedtime. 12/20/18   [provider]      Allergies    Alendronate sodium, Avelox [moxifloxacin], Fosamax [alendronate], Pradaxa [dabigatran etexilate mesylate], Statins, Sulfa antibiotics, Sulfamethoxazole, Sulfonamide derivatives, Varenicline, Ciprofloxacin, Gabapentin, and Tape    Review of Systems   Review of Systems  Constitutional:  Positive for fatigue. Negative for fever.  Respiratory:  Negative for shortness of breath.   Cardiovascular:  Negative for  chest pain.  Gastrointestinal:  Negative for abdominal pain, diarrhea and vomiting.  Musculoskeletal:  Positive for neck pain.  Skin:  Negative for rash.  Neurological:  Positive for speech difficulty, light-headedness and headaches. Negative for dizziness, facial asymmetry, weakness and numbness.  Psychiatric/Behavioral:  Positive for confusion.    Physical Exam Updated Vital Signs BP (!) 113/94   Pulse 74   Temp 97.9 F (36.6 C)   Resp (!) 21   Ht '5\' 7"'$  (1.702 m)   Wt 66.2 kg    SpO2 98%   BMI 22.86 kg/m  Physical Exam Vitals and nursing note reviewed.  Constitutional:      General: She is not in acute distress.    Appearance: Normal appearance.  HENT:     Head: Normocephalic.     Mouth/Throat:     Mouth: Mucous membranes are moist.  Eyes:     Extraocular Movements: Extraocular movements intact.     Pupils: Pupils are equal, round, and reactive to light.  Cardiovascular:     Rate and Rhythm: Normal rate.  Pulmonary:     Effort: Pulmonary effort is normal. No respiratory distress.  Abdominal:     Palpations: Abdomen is soft.     Tenderness: There is no abdominal tenderness.  Musculoskeletal:     Cervical back: No rigidity.  Skin:    General: Skin is warm.  Neurological:     Mental Status: She is alert.     Comments: Alert and oriented but very hesitant to answer questions, admits that she is confused, seems to have slow and somewhat slurred speech but they mention that this is more baseline for the patient.  Possible some aphasia in regards to the speech, otherwise nonfocal exam.  Psychiatric:        Mood and Affect: Mood normal.    ED Results / Procedures / Treatments   Labs (all labs ordered are listed, but only abnormal results are displayed) Labs Reviewed  CBC WITH DIFFERENTIAL/PLATELET - Abnormal; Notable for the following components:      Result Value   Hemoglobin 15.9 (*)    HCT 47.3 (*)    All other components within normal limits  COMPREHENSIVE METABOLIC PANEL  TROPONIN I (HIGH SENSITIVITY)    EKG EKG Interpretation  Date/Time:  Monday July 24 2021 11:32:22 EDT Ventricular Rate:  105 PR Interval:    QRS Duration: 121 QT Interval:  399 QTC Calculation: 528 R Axis:   -77 Text Interpretation: Atrial fibrillation Ventricular premature complex RBBB and LAFB Left ventricular hypertrophy Anterior Q waves, possibly due to LVH ST elevation, consider inferior injury Confirmed by Lavenia Atlas [phone]) on 07/24/2021 2:18:43  PM  Radiology DG Chest Port 1 View  Result Date: 07/24/2021 CLINICAL DATA:  Throat and neck pain. EXAM: PORTABLE CHEST 1 VIEW COMPARISON:  Radiograph 03/06/2021 FINDINGS: Anterior cervical fusion. Normal mediastinum and cardiac silhouette. Normal pulmonary vasculature. No evidence of effusion, infiltrate, or pneumothorax. No acute bony abnormality. IMPRESSION: 1. No acute cardiopulmonary findings. 2. Anterior cervical fusion. Electronically Signed   By: Suzy Bouchard M.D.   On: 07/24/2021 13:51    Procedures Procedures    Medications Ordered in ED Medications  ondansetron (ZOFRAN) injection 4 mg (4 mg Intravenous Given 07/24/21 1402)  morphine (PF) 4 MG/ML injection 4 mg (4 mg Intravenous Given 07/24/21 1403)    ED Course/ Medical Decision Making/ A&P  Medical Decision Making Amount and/or Complexity of Data Reviewed Labs: ordered. Radiology: ordered.  Risk Prescription drug management.   71 year old female presents emergency department with posterior neck and head pain.  Also concern for confusion,, possible change in speech.  She is a very difficult/poor historian.  Otherwise appears nonfocal on neuro exam.  History of atrial fibrillation, she is compliant with her anticoagulation.  Heart rates ranging from 90-1 20, blood pressure stable.  edf Metabolic work-up is reassuring, urinalysis is negative for infection.  Troponin is slightly elevated at 29 but baseline.  Plan for CT neuroimaging given the current symptoms.  Pending these results she may require MRI for further rule out given her very nonspecific presentation and neuro complaints. Patient sign        Final Clinical Impression(s) / ED Diagnoses Final diagnoses:  None    Rx / DC Orders ED Discharge Orders     None         Lorelle Gibbs, DO 07/24/21 1555

## 2021-07-24 NOTE — H&P (Signed)
History and Physical    Patient: Melanie Lucas MRN: [id number] DOA: 07/24/2021  Date of Service: the patient was seen and examined on 07/25/2021  Patient coming from: Home via [place]  Chief Complaint:  Chief Complaint  Patient presents with   Sore Throat    HPI:   71 year old female with past medical history of paroxysmal atrial fibrillation (on Eliquis and Tikosyn), coronary artery disease (PCI 2014), hyperlipidemia, non-insulin dependent diabetes mellitus type 2, peripheral vascular disease  (left CEA 2019), hypertension, diastolic congestive heart failure (Echo 02/2021 EF 55-60% with G1DD), nicotine dependence and right occipital stroke 02/2021 who presents to [facility] as a transfer from [place] after patient presented with several day history of neck and head pain as well as intermittent confusion and aphasia.  Patient explains that for approximately past 3 to 4 days she began to develop occipital headaches and neck pain.  Pain has been moderate in intensity, waxing and waning in intensity and associated with episodes of intermittent confusion and slurred speech.  Patient denies any associated focal weakness, facial droop or loss of balance.  Of note, in January 2023 when patient was diagnosed with an occipital stroke patient had a similar 4-day course of intermittent headaches.  Due to continued intermittent symptoms patient eventually presented to [place] for evaluation.  Upon evaluation at [place] initial noncontrast CT imaging of the head as well as CT angiogram of the head and neck revealed no acute finding with 30% stenosis in the proximal ICA on the right with 40% of the distal bulb region of the left.  ER provider discussed case with Dr. Leonel Ramsay with neurology who recommended hospitalization for continued work-up including MRI brain and EEG.  Patient was then transferred to [facility].   Review of Systems: Review of Systems   Neurological:  Positive for speech change and headaches.  All other systems reviewed and are negative.   Past Medical History:  Diagnosis Date   Anxiety    Cancer ([place])    Skin- leg    Chronic acquired lymphedema    a. R>L;  b. 03/2012 Neg LE U/S for DVT. Legs since age 32   Complication of anesthesia    woke up during colonoscopy, Lithrostrixpy, Biospy   COPD (chronic obstructive pulmonary disease) ([place])    Coronary artery disease    CVD (cardiovascular disease)    Deaf, right    Diabetes mellitus    Type II   Diabetic neuropathy ([place])    Dysrhythmia    afib   Elevated TSH    a. 10/2012 - inst to f/u PCP.   Full dentures    Hematuria    a. while on pradaxa,  she reports that she has seen Dr Margie Ege and had low risk cystoscopy   History of kidney stones    Hyperlipidemia    Hypertension    patient denies   Incontinence    prior to urinating   Lacunar infarction [facility])    a. 02/2009 non-acute Lacunar infarct of the right thalamus noted on MRI of brain.   Left carotid stenosis    Lymphedema of extremity    had this problem, since she was a teenager. especially seen in R LE   Meniere disease    Myocardial infarction [facility])    Obesity    Osteoarthritis    cervical & lumbar region, knees, hands cramp also    PAF with post-termination pauses  a. on dronedarone;  b. CHA2DS2VASc = 5 (HTN, DM, h/o lacunar infarct on MRI, Female) ->refused oral anticoagulation after h/o hematuria on pradaxa;  c. 02/2012 Echo: EF 55-60%, mildly dil LA. D. Recurrent PAF 10/2012 after only taking Multaq 1x/day - spont conv to NSR, placed back on BID Multaq/eliquis;  e. 10/2014 Multaq d/c'd->tikosyn initiated.   Shortness of breath dyspnea    with exertion   Stroke [facility])    2 mini strokes   Tobacco abuse    Vision abnormalities     Past Surgical History:  Procedure Laterality Date   ANTERIOR CERVICAL CORPECTOMY N/A 02/21/2016   Procedure: ANTERIOR CERVICAL CORPECTOMY ANS FUSION CERVICAL SIX ,  ANTERIOR PLATING CERVICAL FIVE-SEVEN;  Surgeon: Consuella Lose, MD;  Location: [place];  Service: Neurosurgery;  Laterality: N/A;   ANTERIOR FUSION CERVICAL SPINE  02/21/2016   Biopsy of adrenal glands     BREAST BIOPSY Bilateral    benign results, both breasts    CARDIAC CATHETERIZATION  2014   x1 stent placed, done in [place]    CARDIOVERSION N/A 11/11/2014   Procedure: CARDIOVERSION;  Surgeon: Larey Dresser, MD;  Location: [place];  Service: Cardiovascular;  Laterality: N/A;   CAROTID ENDARTERECTOMY Left 07/29/2017   COLONOSCOPY W/ POLYPECTOMY     CORONARY STENT PLACEMENT     ENDARTERECTOMY Left 07/29/2017   Procedure: ENDARTERECTOMY CAROTID LEFT;  Surgeon: Rosetta Posner, MD;  Location: [place];  Service: Vascular;  Laterality: Left;   PATCH ANGIOPLASTY Left 07/29/2017   Procedure: PATCH ANGIOPLASTY USING HEMASHIELD GOLD VASCULAR PATCH;  Surgeon: Rosetta Posner, MD;  Location: [place];  Service: Vascular;  Laterality: Left;   TEE WITHOUT CARDIOVERSION N/A 11/11/2014   Procedure: TRANSESOPHAGEAL ECHOCARDIOGRAM (TEE);  Surgeon: Larey Dresser, MD;  Location: [place];  Service: Cardiovascular;  Laterality: N/A;    Social History:  reports that she has been smoking cigarettes. She has a 55.00 pack-year smoking history. She has never used smokeless tobacco. She reports current alcohol use of about 1.0 standard drink per week. She reports that she does not use drugs.  Allergies  Allergen Reactions   Alendronate Sodium     Other reaction(s): pain all over   Avelox [Moxifloxacin]     Unknown reaction    Fosamax [Alendronate]     Pain all over   Pradaxa [Dabigatran Etexilate Mesylate]     Extreme bleeding   Statins Nausea And Vomiting and Other (See Comments)    Muscle pain, Dizziness (intolerance)   Sulfa Antibiotics     Other reaction(s): vomiting   Sulfamethoxazole Nausea And Vomiting    Dizziness (intolerance)   Sulfonamide Derivatives Hives    Nausea vertigo    Varenicline     nightmares Other reaction(s): nightmares   Ciprofloxacin Rash    Other reaction(s): unknown   Gabapentin Rash    burning   Tape Itching and Rash    Please use "paper" tape only    Family History  Problem Relation Age of Onset   Heart attack Father    Coronary artery disease Father        strong family hx   Bladder Cancer Mother        bladder   Leukemia Brother    Breast cancer Maternal Aunt    Breast cancer Paternal Aunt    Breast cancer Maternal Aunt     Prior to Admission medications   Medication Sig Start Date End Date Taking? Authorizing Provider  diazepam (VALIUM) 10  MG tablet Take 1 tablet (10 mg total) by mouth at bedtime as needed for sleep. 03/10/21   Bonnielee Haff, MD  dofetilide (TIKOSYN) 500 MCG capsule Take 1 capsule (500 mcg total) by mouth 2 (two) times daily. Appointment Required For Further Refills [phone] 07/18/21   Sherran Needs, NP  DULoxetine (CYMBALTA) 60 MG capsule Take 60 mg by mouth daily.  07/14/19   [provider]  ELIQUIS 5 MG TABS tablet TAKE 1 TABLET BY MOUTH TWICE A DAY Patient taking differently: Take 5 mg by mouth 2 (two) times daily. 10/31/20   Allred, Jeneen Rinks, MD  Empagliflozin-metFORMIN HCl 12.06-998 MG TABS Take 1 tablet by mouth 2 (two) times daily.    [provider]  furosemide (LASIX) 20 MG tablet Take 20 mg by mouth daily as needed for edema.    [provider]  lactulose (CHRONULAC) 10 GM/15ML solution Take 30 mLs (20 g total) by mouth 2 (two) times daily. 03/10/21   Bonnielee Haff, MD  linaclotide [facility]) 290 MCG CAPS capsule Take 290 mcg by mouth daily before breakfast.    [provider]  losartan (COZAAR) 50 MG tablet TAKE 1 TABLET BY MOUTH EVERY DAY 03/20/21   Allred, Jeneen Rinks, MD  mirabegron ER (MYRBETRIQ) 50 MG TB24 tablet Take 50 mg by mouth daily.    [provider]  Multiple Vitamin (MULTIVITAMIN) tablet Take 1 tablet by mouth at bedtime.     [provider]  nicotine (NICODERM CQ - DOSED IN MG/24 HOURS) 14 mg/24hr patch Place 1 patch (14 mg total) onto the skin daily. 03/11/21   Bonnielee Haff, MD  potassium chloride SA (KLOR-CON M) 20 MEQ tablet Take 1 tablet (20 mEq total) by mouth daily. 05/15/21   Baldwin Jamaica, PA-C  repaglinide (PRANDIN) 2 MG tablet Take 2 mg by mouth in the morning and at bedtime. 12/20/18   [provider]    Physical Exam:  Vitals:   07/25/21 7062 07/25/21 0716 07/25/21 0922 07/25/21 0947  BP: (!) 155/86 100/80 139/79 136/90  Pulse: 80 72    Resp: 18 18 (!) 21   Temp: (!) 97.5 F (36.4 C) 98.1 F (36.7 C) 98 F (36.7 C)   TempSrc: Oral Oral Oral   SpO2: 97% 97% 98%   Weight:      Height:        Constitutional: Lethargic but arousable with periods of agitation, oriented x3, no associated distress.   Skin: no rashes, no lesions, good skin turgor noted. Eyes: Pupils are equally reactive to light.  No evidence of scleral icterus or conjunctival pallor.  ENMT: Moist mucous membranes noted.  Posterior pharynx clear of any exudate or lesions.   Neck: normal, supple, no masses, no thyromegaly.  No evidence of jugular venous distension.   Respiratory: clear to auscultation bilaterally, no wheezing, no crackles. Normal respiratory effort. No accessory muscle use.  Cardiovascular: Regular rate and rhythm, no murmurs / rubs / gallops. No extremity edema. 2+ pedal pulses. No carotid bruits.  Chest:   Nontender without crepitus or deformity.   Back:   Nontender without crepitus or deformity. Abdomen: Abdomen is soft and nontender.  No evidence of intra-abdominal masses.  Positive bowel sounds noted in all quadrants.   Musculoskeletal: No joint deformity upper and lower extremities. Good ROM, no contractures. Normal muscle tone.  Neurologic: CN 2-12 grossly intact. Sensation intact.  Patient moving all 4 extremities spontaneously.  Patient is following all commands.  Patient is responsive to verbal  stimuli.    Psychiatric: Patient exhibits normal mood with appropriate affect.  Patient seems to possess insight as to their current situation.    Data Reviewed:  I have personally reviewed and interpreted labs, imaging.  Significant findings are:  Chemistry revealing sodium 140, potassium 3.7, BUN 17, creatinine 0.59. CBC revealing white blood cell count 6.1, hemoglobin 15.9, hematocrit 47.3, platelet count 207. Urinalysis revealing specific gravity of 1.036 with glucosuria. CT angiogram of the head neck revealing advanced aortic atherosclerosis with atherosclerosis of both carotid bifurcations, 30% stenosis of the proximal ICA on the right, 40% stenosis at the distal bulb region of the left just distal to the endarterectomy area.  Atherosclerotic disease also noted in both carotid siphon regions with stenosis estimated approximately 50%.  No large vessel occlusion noted.  EKG: Personally reviewed.  Rhythm is atrial fibrillation with rapid ventricular response with heart rate of 105 bpm with evidence of right bundle branch block and left anterior fascicular block.     Assessment and Plan: * Acute ischemic stroke [facility])   Performing serial neurologic checks Monitoring patient on telemetry Initiating antiplatelet therapy including Aspirin 81 Qdaily with plan to transition to Eliquis eventually once cleared by neurology. Daily statin therapy will be initiated if LDL is greater than 70 Obtaining hemoglobin A1c and lipid panel in the morning Echocardiogram in the morning PT, OT, SLP evaluation Permissive hypertension with as needed antihypertensives only to be given if blood pressure greater than 220/115 Neurology following in consultation.    Paroxysmal atrial fibrillation (HCC)  Exhibiting bouts of rapid atrial fibrillation throughout the evening Providing patient with doses of intravenous metoprolol as needed Continuing home regimen of dofetilide Holding Eliquis due to acute stroke   Obtaining repeat echocardiogram in the morning Monitoring patient on telemetry TSH at target, monitoring electrolytes.   Chronic diastolic CHF (congestive heart failure) (HCC)  Patient exhibits bilateral lower extremity pitting edema however based on chronic hyperemia and thickening of the bilateral lower extremities this is apparently chronic.   No other clinical evidence of volume overload, may consider a trial of IV Lasix Strict input and output monitoring Daily weights Low-sodium diet   Acute metabolic encephalopathy  Patient is exhibiting lethargy with bouts of agitation a marked change from baseline mentation and activity Etiology of encephalopathy not entirely clear, possibly exacerbated by acute strokes or patient's behavior may even be secondary to underlying seizure activity Managing underlying medical conditions Obtaining vitamin B12, folate, TSH, urinalysis and brain imaging as noted above   Elevated troponin without evidence of myocardial infarction  Slightly elevated serial troponins with flat trajectory of elevation Patient is chest pain-free Likely secondary to underlying illness, plaque rupture is unlikely Monitoring patient on telemetry   Nicotine dependence, cigarettes, uncomplicated  Patient is being counseled daily on smoking cessation. Providing patient with nicotine replacement therapy during this hospitalization.    COPD (chronic obstructive pulmonary disease) (HCC)  No evidence of COPD exacerbation this time Continue home regimen of maintenance inhalers As needed bronchodilator therapy for episodic shortness of breath and wheezing.   Type 2 diabetes mellitus without complication, without long-term current use of insulin (HCC)  Patient been placed on Accu-Cheks before every meal and nightly with sliding scale insulin Holding home regimen of hypoglycemics Hemoglobin A1C ordered Diabetic Diet   Mixed diabetic hyperlipidemia associated with  type 2 diabetes mellitus ([place])  Obtain lipid panel in a.m.   Coronary artery disease involving native coronary artery of native heart without angina pectoris  Patient is currently  chest pain free Monitoring patient on telemetry Continue home regimen of antiplatelet therapy   Essential hypertension, benign  Permissive hypertension in light of acute stroke       Code Status:  Full code    Consults: Dr. Lorrin Goodell with Neurology  Severity of Illness:  The appropriate patient status for this patient is OBSERVATION. Observation status is judged to be reasonable and necessary in order to provide the required intensity of service to ensure the patient's safety. The patient's presenting symptoms, physical exam findings, and initial radiographic and laboratory data in the context of their medical condition is felt to place them at decreased risk for further clinical deterioration. Furthermore, it is anticipated that the patient will be medically stable for discharge from the hospital within 2 midnights of admission.   Author:  Vernelle Emerald MD  07/25/2021 10:25 AM

## 2021-07-24 NOTE — ED Notes (Signed)
Ambulatory to bathroom with assistance, gait unsteady.

## 2021-07-24 NOTE — ED Notes (Signed)
Husband at bedside reports pt did fall last week.

## 2021-07-24 NOTE — ED Notes (Signed)
Pt changed into gown and placed into bed. Pt endorses some SOB, EKG performed and given to EDP.

## 2021-07-25 ENCOUNTER — Observation Stay (HOSPITAL_COMMUNITY): Payer: Medicare PPO

## 2021-07-25 DIAGNOSIS — G9341 Metabolic encephalopathy: Secondary | ICD-10-CM | POA: Diagnosis present

## 2021-07-25 DIAGNOSIS — R000 Tachycardia, unspecified: Secondary | ICD-10-CM | POA: Diagnosis not present

## 2021-07-25 DIAGNOSIS — Y92009 Unspecified place in unspecified non-institutional (private) residence as the place of occurrence of the external cause: Secondary | ICD-10-CM | POA: Diagnosis not present

## 2021-07-25 DIAGNOSIS — I110 Hypertensive heart disease with heart failure: Secondary | ICD-10-CM | POA: Diagnosis present

## 2021-07-25 DIAGNOSIS — I639 Cerebral infarction, unspecified: Secondary | ICD-10-CM

## 2021-07-25 DIAGNOSIS — I700 Atherosclerosis of aorta: Secondary | ICD-10-CM | POA: Diagnosis present

## 2021-07-25 DIAGNOSIS — D6859 Other primary thrombophilia: Secondary | ICD-10-CM | POA: Diagnosis present

## 2021-07-25 DIAGNOSIS — J449 Chronic obstructive pulmonary disease, unspecified: Secondary | ICD-10-CM | POA: Diagnosis present

## 2021-07-25 DIAGNOSIS — I69322 Dysarthria following cerebral infarction: Secondary | ICD-10-CM | POA: Diagnosis not present

## 2021-07-25 DIAGNOSIS — G9589 Other specified diseases of spinal cord: Secondary | ICD-10-CM | POA: Diagnosis present

## 2021-07-25 DIAGNOSIS — I480 Paroxysmal atrial fibrillation: Secondary | ICD-10-CM | POA: Diagnosis present

## 2021-07-25 DIAGNOSIS — D6869 Other thrombophilia: Secondary | ICD-10-CM | POA: Diagnosis not present

## 2021-07-25 DIAGNOSIS — I2510 Atherosclerotic heart disease of native coronary artery without angina pectoris: Secondary | ICD-10-CM | POA: Diagnosis present

## 2021-07-25 DIAGNOSIS — I6381 Other cerebral infarction due to occlusion or stenosis of small artery: Secondary | ICD-10-CM | POA: Diagnosis present

## 2021-07-25 DIAGNOSIS — R410 Disorientation, unspecified: Secondary | ICD-10-CM | POA: Diagnosis not present

## 2021-07-25 DIAGNOSIS — Y9301 Activity, walking, marching and hiking: Secondary | ICD-10-CM | POA: Diagnosis present

## 2021-07-25 DIAGNOSIS — I5033 Acute on chronic diastolic (congestive) heart failure: Secondary | ICD-10-CM

## 2021-07-25 DIAGNOSIS — I5A Non-ischemic myocardial injury (non-traumatic): Secondary | ICD-10-CM

## 2021-07-25 DIAGNOSIS — E782 Mixed hyperlipidemia: Secondary | ICD-10-CM

## 2021-07-25 DIAGNOSIS — W010XXA Fall on same level from slipping, tripping and stumbling without subsequent striking against object, initial encounter: Secondary | ICD-10-CM | POA: Diagnosis present

## 2021-07-25 DIAGNOSIS — R29702 NIHSS score 2: Secondary | ICD-10-CM | POA: Diagnosis present

## 2021-07-25 DIAGNOSIS — I272 Pulmonary hypertension, unspecified: Secondary | ICD-10-CM | POA: Diagnosis present

## 2021-07-25 DIAGNOSIS — Z8249 Family history of ischemic heart disease and other diseases of the circulatory system: Secondary | ICD-10-CM | POA: Diagnosis not present

## 2021-07-25 DIAGNOSIS — R4189 Other symptoms and signs involving cognitive functions and awareness: Secondary | ICD-10-CM | POA: Diagnosis present

## 2021-07-25 DIAGNOSIS — G8191 Hemiplegia, unspecified affecting right dominant side: Secondary | ICD-10-CM | POA: Diagnosis present

## 2021-07-25 DIAGNOSIS — Z882 Allergy status to sulfonamides status: Secondary | ICD-10-CM | POA: Diagnosis not present

## 2021-07-25 DIAGNOSIS — F17210 Nicotine dependence, cigarettes, uncomplicated: Secondary | ICD-10-CM | POA: Diagnosis present

## 2021-07-25 DIAGNOSIS — Z888 Allergy status to other drugs, medicaments and biological substances status: Secondary | ICD-10-CM | POA: Diagnosis not present

## 2021-07-25 DIAGNOSIS — M4802 Spinal stenosis, cervical region: Secondary | ICD-10-CM | POA: Diagnosis not present

## 2021-07-25 DIAGNOSIS — I10 Essential (primary) hypertension: Secondary | ICD-10-CM

## 2021-07-25 DIAGNOSIS — M542 Cervicalgia: Secondary | ICD-10-CM

## 2021-07-25 DIAGNOSIS — E1169 Type 2 diabetes mellitus with other specified complication: Secondary | ICD-10-CM

## 2021-07-25 DIAGNOSIS — Z91048 Other nonmedicinal substance allergy status: Secondary | ICD-10-CM | POA: Diagnosis not present

## 2021-07-25 DIAGNOSIS — G8194 Hemiplegia, unspecified affecting left nondominant side: Secondary | ICD-10-CM | POA: Diagnosis present

## 2021-07-25 DIAGNOSIS — R0602 Shortness of breath: Secondary | ICD-10-CM | POA: Diagnosis not present

## 2021-07-25 DIAGNOSIS — R4701 Aphasia: Secondary | ICD-10-CM | POA: Diagnosis present

## 2021-07-25 LAB — CBC WITH DIFFERENTIAL/PLATELET
Abs Immature Granulocytes: 0.03 10*3/uL (ref 0.00–0.07)
Basophils Absolute: 0 10*3/uL (ref 0.0–0.1)
Basophils Relative: 1 %
Eosinophils Absolute: 0.1 10*3/uL (ref 0.0–0.5)
Eosinophils Relative: 1 %
HCT: 46.2 % — ABNORMAL HIGH (ref 36.0–46.0)
Hemoglobin: 15.6 g/dL — ABNORMAL HIGH (ref 12.0–15.0)
Immature Granulocytes: 1 %
Lymphocytes Relative: 26 %
Lymphs Abs: 1.7 10*3/uL (ref 0.7–4.0)
MCH: 31.6 pg (ref 26.0–34.0)
MCHC: 33.8 g/dL (ref 30.0–36.0)
MCV: 93.7 fL (ref 80.0–100.0)
Monocytes Absolute: 0.5 10*3/uL (ref 0.1–1.0)
Monocytes Relative: 8 %
Neutro Abs: 4.3 10*3/uL (ref 1.7–7.7)
Neutrophils Relative %: 63 %
Platelets: 209 10*3/uL (ref 150–400)
RBC: 4.93 MIL/uL (ref 3.87–5.11)
RDW: 12.7 % (ref 11.5–15.5)
WBC: 6.6 10*3/uL (ref 4.0–10.5)
nRBC: 0 % (ref 0.0–0.2)

## 2021-07-25 LAB — GLUCOSE, CAPILLARY
Glucose-Capillary: 173 mg/dL — ABNORMAL HIGH (ref 70–99)
Glucose-Capillary: 222 mg/dL — ABNORMAL HIGH (ref 70–99)
Glucose-Capillary: 160 mg/dL — ABNORMAL HIGH (ref 70–99)
Glucose-Capillary: 153 mg/dL — ABNORMAL HIGH (ref 70–99)

## 2021-07-25 LAB — COMPREHENSIVE METABOLIC PANEL
ALT: 13 U/L (ref 0–44)
AST: 12 U/L — ABNORMAL LOW (ref 15–41)
Albumin: 3.1 g/dL — ABNORMAL LOW (ref 3.5–5.0)
Alkaline Phosphatase: 62 U/L (ref 38–126)
Anion gap: 10 (ref 5–15)
BUN: 15 mg/dL (ref 8–23)
CO2: 28 mmol/L (ref 22–32)
Calcium: 8.8 mg/dL — ABNORMAL LOW (ref 8.9–10.3)
Chloride: 103 mmol/L (ref 98–111)
Creatinine, Ser: 0.75 mg/dL (ref 0.44–1.00)
GFR, Estimated: >60 mL/min (ref >60)
Glucose, Bld: 172 mg/dL — ABNORMAL HIGH (ref 70–99)
Potassium: 4.1 mmol/L (ref 3.5–5.1)
Sodium: 141 mmol/L (ref 135–145)
Total Bilirubin: 1.2 mg/dL (ref 0.3–1.2)
Total Protein: 5.2 g/dL — ABNORMAL LOW (ref 6.5–8.1)

## 2021-07-25 LAB — LIPID PANEL
Cholesterol: 181 mg/dL (ref 0–200)
HDL: 49 mg/dL (ref >40)
LDL Cholesterol: 91 mg/dL (ref 0–99)
Total CHOL/HDL Ratio: 3.7 RATIO
Triglycerides: 205 mg/dL — ABNORMAL HIGH (ref <150)
VLDL: 41 mg/dL — ABNORMAL HIGH (ref 0–40)

## 2021-07-25 LAB — TSH: TSH: 3.505 u[IU]/mL (ref 0.350–4.500)

## 2021-07-25 LAB — HEMOGLOBIN A1C
Hgb A1c MFr Bld: 7.3 % — ABNORMAL HIGH (ref 4.8–5.6)
Mean Plasma Glucose: 162.81 mg/dL

## 2021-07-25 LAB — VITAMIN B12: Vitamin B-12: 343 pg/mL (ref 180–914)

## 2021-07-25 LAB — FOLATE: Folate: 28.1 ng/mL (ref >5.9)

## 2021-07-25 LAB — MAGNESIUM: Magnesium: 2 mg/dL (ref 1.7–2.4)

## 2021-07-25 MED ORDER — APIXABAN 5 MG PO TABS
5.0000 mg | ORAL_TABLET | Freq: Two times a day (BID) | ORAL | Status: DC
  Administered 2021-07-25 – 2021-08-01 (×14): 5 mg via ORAL
  Filled 2021-07-25 (×15): qty 1

## 2021-07-25 MED ORDER — EZETIMIBE 10 MG PO TABS
10.0000 mg | ORAL_TABLET | Freq: Every day | ORAL | Status: DC
Start: 2021-07-25 — End: 2021-08-02
  Administered 2021-07-25 – 2021-08-01 (×8): 10 mg via ORAL
  Filled 2021-07-25 (×8): qty 1

## 2021-07-25 MED ORDER — METOPROLOL TARTRATE 5 MG/5ML IV SOLN
2.5000 mg | Freq: Once | INTRAVENOUS | Status: AC
  Administered 2021-07-25: 2.5 mg via INTRAVENOUS
  Filled 2021-07-25: qty 5

## 2021-07-25 MED ORDER — DILTIAZEM LOAD VIA INFUSION
15.0000 mg | Freq: Once | INTRAVENOUS | Status: AC
  Administered 2021-07-25: 15 mg via INTRAVENOUS
  Filled 2021-07-25: qty 15

## 2021-07-25 MED ORDER — DILTIAZEM HCL-DEXTROSE 125-5 MG/125ML-% IV SOLN (PREMIX)
5.0000 mg/h | INTRAVENOUS | Status: DC
  Administered 2021-07-25: 5 mg/h via INTRAVENOUS
  Administered 2021-07-26 (×2): 12.5 mg/h via INTRAVENOUS
  Administered 2021-07-27 (×2): 15 mg/h via INTRAVENOUS
  Filled 2021-07-25 (×6): qty 125

## 2021-07-25 MED ORDER — LORAZEPAM 2 MG/ML IJ SOLN
0.5000 mg | Freq: Once | INTRAMUSCULAR | Status: AC | PRN
  Administered 2021-07-26: 0.5 mg via INTRAVENOUS
  Filled 2021-07-25: qty 1

## 2021-07-25 MED ORDER — METOPROLOL TARTRATE 50 MG PO TABS
50.0000 mg | ORAL_TABLET | Freq: Two times a day (BID) | ORAL | Status: DC
  Administered 2021-07-25: 50 mg via ORAL
  Filled 2021-07-25: qty 1

## 2021-07-25 MED ORDER — METOPROLOL TARTRATE 5 MG/5ML IV SOLN
5.0000 mg | INTRAVENOUS | Status: AC | PRN
  Administered 2021-07-25 (×3): 5 mg via INTRAVENOUS
  Filled 2021-07-25 (×3): qty 5

## 2021-07-25 MED ORDER — ASPIRIN 81 MG PO TBEC
81.0000 mg | DELAYED_RELEASE_TABLET | Freq: Every day | ORAL | Status: DC
  Administered 2021-07-25: 81 mg via ORAL
  Filled 2021-07-25 (×2): qty 1

## 2021-07-25 MED ORDER — POTASSIUM CHLORIDE 20 MEQ PO PACK
40.0000 meq | PACK | Freq: Once | ORAL | Status: AC
  Administered 2021-07-25: 40 meq via ORAL
  Filled 2021-07-25: qty 2

## 2021-07-25 MED ORDER — FUROSEMIDE 10 MG/ML IJ SOLN
20.0000 mg | Freq: Once | INTRAMUSCULAR | Status: AC
Start: 2021-07-25 — End: 2021-07-25
  Administered 2021-07-25: 20 mg via INTRAVENOUS
  Filled 2021-07-25: qty 4

## 2021-07-25 NOTE — Assessment & Plan Note (Signed)
-  Nicotine patch 

## 2021-07-25 NOTE — Procedures (Signed)
Patient Name: Melanie Lucas  MRN: [id number]  Epilepsy Attending: Lora Havens  Referring Physician/Provider: Regan Lemming, MD Date: 07/25/2021 Duration: 25.23 mins  Patient history:  71 y.o. female who presents with occipital and neck pain since fall on Saturday along with intermittent episodes of confusion and aphasia. EEG to evaluate for seizure  Level of alertness: Awake  AEDs during EEG study: None  Technical aspects: This EEG study was done with scalp electrodes positioned according to the 10-20 International system of electrode placement. Electrical activity was acquired at a sampling rate of '500Hz'$  and reviewed with a high frequency filter of '70Hz'$  and a low frequency filter of '1Hz'$ . EEG data were recorded continuously and digitally stored.   Description: The posterior dominant rhythm consists of 8 Hz activity of moderate voltage (25-35 uV) seen predominantly in posterior head regions, symmetric and reactive to eye opening and eye closing.  EEG showed intermittent generalized and lateralized left hemisphere 3 to 6 Hz theta and delta slowing.  Physiologic photic driving was not seen during photic stimulation.  Hyperventilation was not performed.     ABNORMALITY - Intermittent slow, generalized and lateralized left hemisphere  IMPRESSION: This study is suggestive of cortical dysfunction in left hemisphere which is nonspecific but could be secondary to underlying stroke.  Additionally there is mild diffuse encephalopathy, nonspecific etiology.  No seizures or epileptiform discharges were seen throughout the recording.  [name]

## 2021-07-25 NOTE — Evaluation (Signed)
Physical Therapy Evaluation Patient Details Name: Melanie Lucas MRN: [id number] DOB: September 06, 1950 Today's Date: 07/25/2021  History of Present Illness  Pt is a 71 y/o F presenting to [facility] ED with posterior head and neck pain, confusion, and wordfinding difficulties. Transferred to [facility] for further workup. EEG showing corical dysfunction of L hemisphere, MRI revealing acute/subacute infarcts in L basal ganglia and corona radiata. PMH includes A fib on Eliquis, R occipital CVA (02/2021), R ear deafness, HTN, DM2, CAD, and HLD.  Clinical Impression  Pt admitted with/for s/s of stroke as stated above with problems stated below.  Pt showing difficulty with focus on tasks, decreased cognition with unfamiliar on non routine tasks and a general unawareness of deficits overall.  Pt has had a room mate over the last few months, but no caregivers or family available to speak to this pm to find out if other safety measures can be placed to improve pt's safety and medical compliance.   Pt currently limited functionally due to the problems listed. ( See problems list.)   Pt will benefit from PT to maximize function and safety in order to get ready for next venue listed below.        Recommendations for follow up therapy are one component of a multi-disciplinary discharge planning process, led by the attending physician.  Recommendations may be updated based on patient status, additional functional criteria and insurance authorization.  Follow Up Recommendations Skilled nursing-short term rehab (<3 hours/day)    Assistance Recommended at Discharge Intermittent Supervision/Assistance  Patient can return home with the following       Equipment Recommendations Other (comment) (TBA)  Recommendations for Other Services       Functional Status Assessment Patient has had a recent decline in their functional status and demonstrates the ability to make significant improvements in function in a reasonable and  predictable amount of time.     Precautions / Restrictions Precautions Precautions: Fall Restrictions Weight Bearing Restrictions: No      Mobility  Bed Mobility               General bed mobility comments: OOB in chair at the sink upon arrival    Transfers Overall transfer level: Needs assistance Equipment used: Rolling walker (2 wheels) Transfers: Sit to/from Stand Sit to Stand: Min guard           General transfer comment: more difficulty from armless chair.    Ambulation/Gait Ambulation/Gait assistance: Min assist Gait Distance (Feet): 230 Feet Assistive device: 1 person hand held assist, Rolling walker (2 wheels) Gait Pattern/deviations: Step-through pattern   Gait velocity interpretation: <1.31 ft/sec, indicative of household ambulator   General Gait Details: mildly unsteady overall with Consistent R drift and slower cadence.  Frequently losing focus on task, unable to use environmental cues to wayfind back to her room, walking into room with a patient already in the bed.  During gait HR's rose to 170+ bpm  Stairs Stairs: Yes Stairs assistance: Min guard Stair Management: One rail Right, Two rails, Alternating pattern, Step to pattern Number of Stairs: 5 General stair comments: pt not following direction in using only 1 rail to simulate home, but with 2 rails was safe on the stairs  Wheelchair Mobility    Modified Rankin (Stroke Patients Only) Modified Rankin (Stroke Patients Only) Pre-Morbid Rankin Score: No symptoms     Balance Overall balance assessment: Needs assistance Sitting-balance support: Bilateral upper extremity supported, Feet supported   Sitting balance - Comments: can reach outside  BOS without LOB   Standing balance support: During functional activity, Reliant on assistive device for balance Standing balance-Leahy Scale: Fair Standing balance comment: moderately reliant on external support                              Pertinent Vitals/Pain Pain Assessment Pain Assessment: No/denies pain    Home Living Family/patient expects to be discharged to:: Private residence Living Arrangements: Non-relatives/Friends (roommate) Available Help at Discharge: Family;Available PRN/intermittently (daughters come by intermittently) Type of Home: House Home Access: Stairs to enter Entrance Stairs-Rails: Psychiatric nurse of Steps: 5   Home Layout: One level Home Equipment: Rollator (4 wheels);Kasandra Knudsen - single point Additional Comments: info obtained per chart review 02/2021 given pt questionable historian    Prior Function Prior Level of Function : Independent/Modified Independent;Driving;History of Falls (last six months)             Mobility Comments: uses rollator ADLs Comments: does IADLs, still drives     Hand Dominance        Extremity/Trunk Assessment   Upper Extremity Assessment Upper Extremity Assessment: Generalized weakness    Lower Extremity Assessment Lower Extremity Assessment: Generalized weakness    Cervical / Trunk Assessment Cervical / Trunk Assessment: Normal  Communication   Communication: Expressive difficulties  Cognition Arousal/Alertness: Awake/alert Behavior During Therapy: Impulsive, Restless, Flat affect Overall Cognitive Status: Impaired/Different from baseline Area of Impairment: Orientation, Following commands, Safety/judgement, Awareness, Problem solving, Memory, Attention                 Orientation Level: Place, Situation Current Attention Level: Focused Memory: Decreased short-term memory Following Commands: Follows one step commands inconsistently Safety/Judgement: Decreased awareness of deficits, Decreased awareness of safety Awareness: Emergent, Intellectual Problem Solving: Slow processing, Requires verbal cues, Difficulty sequencing, Decreased initiation General Comments: able to complete routine ADL tasks without difficulty,  follows single step commands inconsistently as pt often needing repetition to initiate tasks, difficulty when challenged/non-routine.  Unable to wayfind using environmental /therapist cuing, Could not follow patterns of room numbers or later remember what room we were going tol        General Comments General comments (skin integrity, edema, etc.): pt not acknowledging that there is anything wrong with her medical status.  She is unable to focus on task, follow simple instruction without repeitive redirection.  Pt unable to "wayfind" back to her room even with both therapist and environment cues.    Exercises     Assessment/Plan    PT Assessment Patient needs continued PT services  PT Problem List Decreased strength;Decreased activity tolerance;Decreased mobility;Decreased cognition;Decreased safety awareness       PT Treatment Interventions Gait training;Functional mobility training;Therapeutic activities;Balance training;Neuromuscular re-education;Patient/family education;DME instruction    PT Goals (Current goals can be found in the Care Plan section)  Acute Rehab PT Goals Patient Stated Goal: pt unable to focus on goals Time For Goal Achievement: 08/08/21 Potential to Achieve Goals: Good    Frequency Min 3X/week     Co-evaluation               AM-PAC PT "6 Clicks" Mobility  Outcome Measure Help needed turning from your back to your side while in a flat bed without using bedrails?: A Little Help needed moving from lying on your back to sitting on the side of a flat bed without using bedrails?: A Little Help needed moving to and from a bed to a chair (including a  wheelchair)?: A Little Help needed standing up from a chair using your arms (e.g., wheelchair or bedside chair)?: A Little Help needed to walk in hospital room?: A Little Help needed climbing 3-5 steps with a railing? : A Little 6 Click Score: 18    End of Session   Activity Tolerance: Patient tolerated  treatment well Patient left: in chair;with call bell/phone within reach;with chair alarm set;with nursing/sitter in room Nurse Communication: Mobility status PT Visit Diagnosis: Unsteadiness on feet (R26.81);Other abnormalities of gait and mobility (R26.89);Muscle weakness (generalized) (M62.81);Other symptoms and signs involving the nervous system (R29.898)    Time: 1540-1603 PT Time Calculation (min) (ACUTE ONLY): 23 min   Charges:   PT Evaluation $PT Eval Moderate Complexity: 1 Mod PT Treatments $Gait Training: 8-22 mins        07/25/2021  Ginger Carne., PT Acute Rehabilitation Services [phone]  (pager) [phone]  (office)  Tessie Fass Jaydalynn Olivero 07/25/2021, 5:29 PM

## 2021-07-25 NOTE — Assessment & Plan Note (Signed)
No active flare, not on inhlaers at home

## 2021-07-25 NOTE — Progress Notes (Signed)
PT Cancellation Note  Patient Details Name: Melanie Lucas MRN: [id number] DOB: 1950/12/20   Cancelled Treatment:    Reason Eval/Treat Not Completed: Patient not medically ready.  HR not yet controlled.  RN asks to hold at this time. Will check back later as able to see if pt appropriate for therapy. 07/25/2021  Ginger Carne., PT Acute Rehabilitation Services [phone]  (pager) [phone]  (office)   Tessie Fass Shanna Un 07/25/2021, 10:19 AM

## 2021-07-25 NOTE — Progress Notes (Signed)
  Transition of Care [facility]) Screening Note   Patient Details  Name: Melanie Lucas Date of Birth: Jun 30, 1950   Transition of Care [facility]) CM/SW Contact:    Pollie Friar, RN Phone Number: 07/25/2021, 1:28 PM    Transition of Care Department [facility]) has reviewed patient. We will continue to monitor patient advancement through interdisciplinary progression rounds. Awaiting PT/OT evals.

## 2021-07-25 NOTE — Progress Notes (Addendum)
STROKE TEAM PROGRESS NOTE   INTERVAL HISTORY Patient is seen in her room with a friend at the bedside.  She states that she came to the hospital because of a stroke but is unable to state her symptoms.  Family had some concern for worsening aphasia, but family friend states that her speech is at baseline.  She did have a fall at home and struck her head.  MRI reveals acute left basal ganglia lacunar infarct.  CT angiogram of the brain shows 30% right ICA and 40% left ICA stenosis in the neck.  Hemoglobin A1c is 7.3 and LDL cholesterol is 125 mg percent.  MRI scan of the C-spine shows postoperative changes of ACDF at C5-7 with moderate right-sided foraminal stenosis and mild C6/7 spinal stenosis.  Patient is currently on long-term EEG monitoring overnight.  So far no epileptiform activity is noted. She has prior history of numerous strokes in 2011 and 2019.  Last stroke was in January 2023 when she had right occipital infarct.  And has chronic atrial fibrillation and was on Eliquis .she also has history of cognitive impairment  at baseline Vitals:   07/25/21 0922 07/25/21 0947 07/25/21 1027 07/25/21 1202  BP: 139/79 136/90 (!) 146/65 (!) 148/98  Pulse:   72 74  Resp: (!) '21 19  16  '$ Temp: 98 F (36.7 C) 98.2 F (36.8 C)  98.4 F (36.9 C)  TempSrc: Oral Oral  Oral  SpO2: 98% 97%  96%  Weight:      Height:       CBC:  Recent Labs  Lab 07/24/21 1358 07/25/21 0315  WBC 6.1 6.6  NEUTROABS 3.9 4.3  HGB 15.9* 15.6*  HCT 47.3* 46.2*  MCV 92.9 93.7  PLT 207 619   Basic Metabolic Panel:  Recent Labs  Lab 07/24/21 1358 07/25/21 0315  NA 140 141  K 3.7 4.1  CL 103 103  CO2 28 28  GLUCOSE 97 172*  BUN 17 15  CREATININE 0.59 0.75  CALCIUM 9.3 8.8*  MG  --  2.0   Lipid Panel:  Recent Labs  Lab 07/25/21 0315  CHOL 181  TRIG 205*  HDL 49  CHOLHDL 3.7  VLDL 41*  LDLCALC 91   HgbA1c:  Recent Labs  Lab 07/25/21 0315  HGBA1C 7.3*   Urine Drug Screen: No results for input(s):  LABOPIA, COCAINSCRNUR, LABBENZ, AMPHETMU, THCU, LABBARB in the last 168 hours.  Alcohol Level No results for input(s): ETH in the last 168 hours.  IMAGING past 24 hours CT ANGIO HEAD NECK W WO CM  Result Date: 07/24/2021 CLINICAL DATA:  Speech disturbance.  Confusion.  Dizziness. EXAM: CT ANGIOGRAPHY HEAD AND NECK TECHNIQUE: Multidetector CT imaging of the head and neck was performed using the standard protocol during bolus administration of intravenous contrast. Multiplanar CT image reconstructions and MIPs were obtained to evaluate the vascular anatomy. Carotid stenosis measurements (when applicable) are obtained utilizing NASCET criteria, using the distal internal carotid diameter as the denominator. RADIATION DOSE REDUCTION: This exam was performed according to the departmental dose-optimization program which includes automated exposure control, adjustment of the mA and/or kV according to patient size and/or use of iterative reconstruction technique. CONTRAST:  9m OMNIPAQUE IOHEXOL 350 MG/ML SOLN COMPARISON:  03/08/2021 FINDINGS: CT HEAD FINDINGS Brain: Generalized atrophy. Chronic ischemic changes of the pons. Extensive chronic ischemic changes of the cerebral hemispheric white matter. No identifiable acute infarction, mass lesion, hemorrhage, hydrocephalus or extra-axial collection. Vascular: There is atherosclerotic calcification of the major vessels  at the base of the brain. Skull: Negative Sinuses: Clear Orbits: Normal Review of the MIP images confirms the above findings CTA NECK FINDINGS Aortic arch: Aortic atherosclerosis. Branching pattern is normal without origin stenosis. Right carotid system: Common carotid artery widely patent to the bifurcation. Carotid bifurcation shows calcified plaque. Minimal diameter of the proximal ICA is 3.5 mm. Compared to a more distal cervical ICA diameter of 5 mm, this indicates a 30% stenosis. Left carotid system: Common carotid artery widely patent to the  bifurcation. Carotid bifurcation is widely patent. Possible previous endarterectomy. Stenosis of the distal bulb with minimal diameter of 3 mm. Compared to a more distal cervical ICA diameter of 5 mm, this indicates a 40% stenosis. Vertebral arteries: Both vertebral artery origins are widely patent. Both vertebral arteries appear normal through the cervical region to the foramen magnum. Skeleton: Chronic degenerative spondylosis.  Distant ACDF C5-C7. Other neck: No mass or lymphadenopathy. Upper chest: Chronic interstitial lung markings.  No acute finding. Review of the MIP images confirms the above findings CTA HEAD FINDINGS Anterior circulation: Both internal carotid arteries are patent through the skull base and siphon regions. There is siphon atherosclerotic calcification with stenosis estimated at 50% on both sides. The anterior and middle cerebral vessels are patent. No large vessel occlusion or flow limiting proximal stenosis. No aneurysm or vascular malformation. More distal branch vessels show atherosclerotic irregularity. Posterior circulation: Both vertebral arteries are patent to the basilar. No basilar stenosis. Posterior circulation branch vessels are patent. Some atherosclerotic irregularity of the more distal PCA branches. Venous sinuses: Patent and normal. Anatomic variants: None significant. Review of the MIP images confirms the above findings IMPRESSION: Advanced chronic small-vessel ischemic changes affecting the brainstem and cerebral hemispheric white matter. No acute CT finding. Advanced aortic atherosclerosis. Atherosclerosis at both carotid bifurcations. 30% stenosis of the proximal ICA on the right. 40% stenosis of the distal bulb region on the left just distal to the endarterectomy segment. Atherosclerotic disease in both carotid siphon regions with stenosis estimated at 50% on both sides. No intracranial large vessel occlusion or flow limiting proximal branch vessel stenosis. Distal  vessel atherosclerotic irregularity. Electronically Signed   By: Nelson Chimes M.D.   On: 07/24/2021 16:48   MR BRAIN WO CONTRAST  Result Date: 07/25/2021 CLINICAL DATA:  Altered mental status EXAM: MRI HEAD WITHOUT CONTRAST MRI CERVICAL SPINE WITHOUT CONTRAST TECHNIQUE: Multiplanar, multiecho pulse sequences of the brain and surrounding structures, and cervical spine, to include the craniocervical junction and cervicothoracic junction, were obtained without intravenous contrast. COMPARISON:  Cervical spine MRI 01/21/2018 Brain MRI 03/07/2021 FINDINGS: MRI HEAD FINDINGS Brain: Acute/early subacute infarcts within the left basal ganglia and left corona radiata. No acute or chronic hemorrhage. There is confluent hyperintense T2-weighted signal within the white matter. There is advanced atrophy. The midline structures are normal. Vascular: Major flow voids are preserved. Skull and upper cervical spine: Normal calvarium and skull base. Visualized upper cervical spine and soft tissues are normal. Sinuses/Orbits:No paranasal sinus fluid levels or advanced mucosal thickening. No mastoid or middle ear effusion. Normal orbits. MRI CERVICAL SPINE FINDINGS Alignment: Physiologic. Vertebrae: C5-7 ACDF Cord: Large area of myelomalacia within the cervical spinal cord at the C5-6 levels, unchanged. No acute cord lesion. Posterior Fossa, vertebral arteries, paraspinal tissues: Negative Disc levels: C1-2: Unremarkable. C2-3: Normal disc space and facet joints. There is no spinal canal stenosis. No neural foraminal stenosis. C3-4: Normal disc space and facet joints. There is no spinal canal stenosis. No neural foraminal stenosis. C4-5:  Right facet hypertrophy with small right uncovertebral spur. No spinal canal stenosis. Mild right neural foraminal stenosis. C5-6: ACDF with right uncovertebral spurring, unchanged. There is no spinal canal stenosis. Moderate right neural foraminal stenosis. C6-7: ACDF with large right uncovertebral  spur, unchanged. Unchanged mild spinal canal stenosis. Unchanged moderate right neural foraminal stenosis. C7-T1: Normal disc space and facet joints. There is no spinal canal stenosis. No neural foraminal stenosis. IMPRESSION: 1. Acute/early subacute infarcts within the left basal ganglia and left corona radiata. No hemorrhage or mass effect. 2. Unchanged C5-7 ACDF with moderate right neural foraminal stenosis and mild spinal canal stenosis at the C6-7 level. 3. Large area of myelomalacia within the cervical spinal cord at the C5-6 levels, unchanged. Electronically Signed   By: Ulyses Jarred M.D.   On: 07/25/2021 03:17   MR CERVICAL SPINE WO CONTRAST  Result Date: 07/25/2021 CLINICAL DATA:  Altered mental status EXAM: MRI HEAD WITHOUT CONTRAST MRI CERVICAL SPINE WITHOUT CONTRAST TECHNIQUE: Multiplanar, multiecho pulse sequences of the brain and surrounding structures, and cervical spine, to include the craniocervical junction and cervicothoracic junction, were obtained without intravenous contrast. COMPARISON:  Cervical spine MRI 01/21/2018 Brain MRI 03/07/2021 FINDINGS: MRI HEAD FINDINGS Brain: Acute/early subacute infarcts within the left basal ganglia and left corona radiata. No acute or chronic hemorrhage. There is confluent hyperintense T2-weighted signal within the white matter. There is advanced atrophy. The midline structures are normal. Vascular: Major flow voids are preserved. Skull and upper cervical spine: Normal calvarium and skull base. Visualized upper cervical spine and soft tissues are normal. Sinuses/Orbits:No paranasal sinus fluid levels or advanced mucosal thickening. No mastoid or middle ear effusion. Normal orbits. MRI CERVICAL SPINE FINDINGS Alignment: Physiologic. Vertebrae: C5-7 ACDF Cord: Large area of myelomalacia within the cervical spinal cord at the C5-6 levels, unchanged. No acute cord lesion. Posterior Fossa, vertebral arteries, paraspinal tissues: Negative Disc levels: C1-2:  Unremarkable. C2-3: Normal disc space and facet joints. There is no spinal canal stenosis. No neural foraminal stenosis. C3-4: Normal disc space and facet joints. There is no spinal canal stenosis. No neural foraminal stenosis. C4-5: Right facet hypertrophy with small right uncovertebral spur. No spinal canal stenosis. Mild right neural foraminal stenosis. C5-6: ACDF with right uncovertebral spurring, unchanged. There is no spinal canal stenosis. Moderate right neural foraminal stenosis. C6-7: ACDF with large right uncovertebral spur, unchanged. Unchanged mild spinal canal stenosis. Unchanged moderate right neural foraminal stenosis. C7-T1: Normal disc space and facet joints. There is no spinal canal stenosis. No neural foraminal stenosis. IMPRESSION: 1. Acute/early subacute infarcts within the left basal ganglia and left corona radiata. No hemorrhage or mass effect. 2. Unchanged C5-7 ACDF with moderate right neural foraminal stenosis and mild spinal canal stenosis at the C6-7 level. 3. Large area of myelomalacia within the cervical spinal cord at the C5-6 levels, unchanged. Electronically Signed   By: Ulyses Jarred M.D.   On: 07/25/2021 03:17   DG CHEST PORT 1 VIEW  Result Date: 07/25/2021 CLINICAL DATA:  [id number], shortness of breath, tachycardia. History of COPD, diabetes, hypertension and stroke EXAM: PORTABLE CHEST 1 VIEW COMPARISON:  July 24, 2021 FINDINGS: Mild cardiomegaly. Stable atheromatous calcifications at the arch of the aorta. Both lungs are clear without evidence of consolidation, vascular congestion, pleural effusion or pneumothorax. Post cervical fusion changes in the visualized cervical spine. IMPRESSION: No active disease.  Mild cardiomegaly. Electronically Signed   By: Frazier Richards M.D.   On: 07/25/2021 10:37   EEG adult  Result Date: 07/25/2021 Zeb Comfort  Jenetta Downer, MD     07/25/2021  8:59 AM Patient Name: Melanie Lucas MRN: [id number] Epilepsy Attending: Lora Havens Referring  Physician/Provider: Regan Lemming, MD Date: 07/25/2021 Duration: 25.23 mins Patient history:  71 y.o. female who presents with occipital and neck pain since fall on Saturday along with intermittent episodes of confusion and aphasia. EEG to evaluate for seizure Level of alertness: Awake AEDs during EEG study: None Technical aspects: This EEG study was done with scalp electrodes positioned according to the 10-20 International system of electrode placement. Electrical activity was acquired at a sampling rate of '500Hz'$  and reviewed with a high frequency filter of '70Hz'$  and a low frequency filter of '1Hz'$ . EEG data were recorded continuously and digitally stored. Description: The posterior dominant rhythm consists of 8 Hz activity of moderate voltage (25-35 uV) seen predominantly in posterior head regions, symmetric and reactive to eye opening and eye closing.  EEG showed intermittent generalized and lateralized left hemisphere 3 to 6 Hz theta and delta slowing.  Physiologic photic driving was not seen during photic stimulation.  Hyperventilation was not performed.   ABNORMALITY - Intermittent slow, generalized and lateralized left hemisphere IMPRESSION: This study is suggestive of cortical dysfunction in left hemisphere which is nonspecific but could be secondary to underlying stroke.  Additionally there is mild diffuse encephalopathy, nonspecific etiology.  No seizures or epileptiform discharges were seen throughout the recording. Priyanka Barbra Sarks   Overnight EEG with video  Result Date: 07/25/2021 Lora Havens, MD     07/25/2021  9:03 AM Patient Name: Melanie Lucas MRN: [id number] Epilepsy Attending: Lora Havens Referring Physician/Provider: Regan Lemming, MD Duration: 07/25/2021 2703 to 0900  Patient history:  71 y.o. female who presents with occipital and neck pain since fall on Saturday along with intermittent episodes of confusion and aphasia. EEG to evaluate for seizure  Level of alertness: Awake, asleep  AEDs  during EEG study: None  Technical aspects: This EEG study was done with scalp electrodes positioned according to the 10-20 International system of electrode placement. Electrical activity was acquired at a sampling rate of '500Hz'$  and reviewed with a high frequency filter of '70Hz'$  and a low frequency filter of '1Hz'$ . EEG data were recorded continuously and digitally stored.  Description: The posterior dominant rhythm consists of 8 Hz activity of moderate voltage (25-35 uV) seen predominantly in posterior head regions, symmetric and reactive to eye opening and eye closing.  EEG showed intermittent generalized and lateralized left hemisphere 3 to 6 Hz theta and delta slowing.  Physiologic photic driving was not seen during photic stimulation.  Hyperventilation was not performed.    ABNORMALITY - Intermittent slow, generalized and lateralized left hemisphere  IMPRESSION: This study is suggestive of cortical dysfunction in left hemisphere which is nonspecific but could be secondary to underlying stroke.  Additionally there is mild diffuse encephalopathy, nonspecific etiology.  No seizures or epileptiform discharges were seen throughout the recording.  [place]     PHYSICAL EXAM General:  Alert, well-developed, well-nourished elderly patient in no acute distress Respiratory:  Regular, unlabored respirations on room air  NEURO:  Mental Status: AA&Ox2, able to recall only last two presidents.  Diminished attention, registration follows simple midline and occasional one-step commands only Speech/Language: speech is slow with delayed responses and some difficulty getting words out  Cranial Nerves:  II: PERRL.  III, IV, VI: EOMI. Eyelids elevate symmetrically.  V: Sensation is intact to light touch and symmetrical to face.  VII: Smile is  symmetrical.  VIII: hearing intact to voice. IX, X: Phonation is normal.  XII: tongue is midline without fasciculations. Motor: 5/5 strength to all muscle groups tested.   Tone: is normal and bulk is normal Sensation- Intact to light touch bilaterally.  Coordination: FTN intact bilaterally.No drift.  Gait- deferred   ASSESSMENT/PLAN Melanie Lucas is a 71 y.o. female with history of DM2, neuropathy, stroke, tobacco abuse, memory and balance deficits and atrial fibrillation on Eliquis presenting with worsening aphasia and confusion.  Patient is able to state that she is in the hospital for a stroke but cannot describe her symptoms.  Family friend in the room states that her speech is at baseline.  She did fall at home and strike her head, but MRI of c-spine reveals no acute changes.  MRI brain reveals acute left basal ganglia lacunar infarct.  Stroke:  left lacunar basal ganglia infarct likely  secondary to small vessel disease.  Prior history of multiple strokes and chronic A-fib CT head No acute abnormality. Small vessel disease.  CTA head & neck 30% stenosis of right ICA, 40% stenosis of left ICA distal to endarterectomy segment, no intracranial LVO MRI  acute infarct in left basal ganglia/corona radiata 2D Echo pending LDL 91 HgbA1c 7.3 VTE prophylaxis - SCDs    Diet   Diet heart healthy/carb modified Room service appropriate? Yes; Fluid consistency: Thin   Eliquis (apixaban) daily prior to admission, now on aspirin 81 mg daily. OK to restart Eliquis.  No need for additional aspirin Therapy recommendations:  pending Disposition:  pending  Atrial fibrillation Patient has history of PAF, on dofetilide OK to resume home Eliquis   Hypertension Home meds:  losartan 50 mg daily Stable Permissive hypertension (OK if < 220/120) but gradually normalize in 5-7 days Long-term BP goal normotensive  Hyperlipidemia Home meds:  none LDL 91, goal < 70 Add zetia 10 mg daily High intensity statin not indicated due to intolerance Continue statin at discharge  Diabetes type II Uncontrolled Home meds:  repaglinide 2 mg BID, empagliflozin-metformin  12.06-998 mg BID HgbA1c 7.3, goal < 7.0 CBGs Recent Labs    07/25/21 0610 07/25/21 1150  GLUCAP 173* 222*    SSI  Other Stroke Risk Factors Advanced Age >/= 28  Cigarette smoker advised to stop smoking Hx stroke  Other Active Problems none  Hospital day # 0  Antony Contras , MSN, AGACNP-BC [facility] See Amion for schedule and pager information 07/25/2021 2:07 PM   Stroke MD Note ;  I have personally obtained history,examined this patient, reviewed notes, independently viewed imaging studies, participated in medical decision making and plan of care.ROS completed by me personally and pertinent positives fully documented  I have made any additions or clarifications directly to the above note. Agree with note above.  Patient with history of multiple prior strokes and chronic A-fib on Eliquis presented with a fall and neck pain and MRI shows left subcortical lacunar infarct.  Continue Eliquis for stroke prevention as there is no data suggesting switching Eliquis to Pradaxa or alternative NOAC use of any additional benefit.  Continue ongoing stroke work-up.  Aggressive risk factor modification.  From discussion with the patient and with her son over the phone and answered questions.  Discussed with Dr. Loleta Books Greater than 50% time during this 50-minute visit was spent on counseling and coordination of care about her stroke and atrial fibrillation and discussion about evaluation, prevention and treatment and answering questions and discussion with care team. Mamie Nick  Leonie Man, [place] Pager: [phone] 07/25/2021 2:11 PM   To contact Stroke Continuity provider, please refer to [email or web address] After hours, contact General Neurology

## 2021-07-25 NOTE — Assessment & Plan Note (Signed)
Due to [employer] - Hold ELiquis until cleared by Neuro

## 2021-07-25 NOTE — Assessment & Plan Note (Addendum)
-   some probable underlying degree of cognitive impairment per discussion with daughter - s/p intermittent delirium - no longer requiring sitters or restraints

## 2021-07-25 NOTE — Assessment & Plan Note (Addendum)
Statin intolerant -Continue Eliquis, Zetia -continue losartan

## 2021-07-25 NOTE — Progress Notes (Addendum)
Progress Note   Patient: Melanie Lucas NAT:[id number] DOB: 18-Apr-1950 DOA: 07/24/2021     0 DOS: the patient was seen and examined on 07/25/2021 at 8:55 AM      Brief hospital course: Melanie Lucas is a 71 y.o. F with pAF on Eliquis and dofetilide, recent stroke Jan 2023, CAD s/p PCI remote, DM, PVD carotid, HTN, dCHF and smoking who presented with headache, neck pain and confusion for possibly a few days.  In the ER, Neurology felt she appeared confused and unreliable in her reports.  CTH and CTA head and neck unremarkable.  HRs elevated.  Legs swollen.   6/5: MRI shows acute/subacute infarcts in L BG and corona radiata; Neuro consulted, patient in Rapid Afib     Assessment and Plan: * Acute ischemic stroke (Melanie Lucas) MRI shows acute and subacute infarcts in L BG and corona radiata.  Non-invasive angiography shows no severe intracranial stenoses, and <50% carotids, old LEFT CEA, moderate neck atherosclerosis.  -Echocardiogram done 5 months ago, will not repeat at this time -Lipids ordered, statin intolerant - Continue aspirin - Hold Eliquis - Afib known prior to admission, see below  -tPA not given because outside window -Dysphagia screen ordered in ER -PT eval ordered  -Smoking cessation: nicotine ordered    Paroxysmal atrial fibrillation (Melanie Lucas) Rates up to the 160s again this morning.  In clinical CHF. - Continue dofetilide - Hold Eliquis given stroke - Give IV metoprolol now - Keep mag>2, K>4   ADDENDUM:  Discussed with Stroke neurology, RPH: plan to stop aspirin, resume Eliquis    Acute on chronic diastolic CHF (congestive heart failure) (HCC) EF 55-60% in Jan 2023, grade I DD and normal valves at that time. Now in rapid Afib, appears to have orthopnea, marked pitting LE edema - Obtain CXR repeat - Control rate  - Start IV Lasix - Strict I'Os, daily BMP - Supp K   ADDENDUM: Spoke with daugther who confirmed patient has marked lymphedema at baseline.     Acute  metabolic encephalopathy At baseline the patient has no cognitive impairment.  Here initially she was disoriented, this seems slightly better, but she is still poorly responsive to questions, and not at her cognitive baseline. Likely due to stroke, Afib, CHF. TSH, B12 normal.   - Hold psychoactive medications - EEG when medically stable - Consult Neurology - Follow B1 level  Neck pain Patient concerned about "cracking" in her neck after a recent fall.  MRI C-spine unremarkable.  Acquired thrombophilia (Melanie Lucas) Due to Afb - Hold ELiquis until cleared by Neuro  Myocardial injury Troponin leak due to rapid afib.  Myocardial infarction ruled out, no further ischemic work up at this time.  Nicotine dependence, cigarettes, uncomplicated - Nicotine patch  COPD (chronic obstructive pulmonary disease) (HCC) No active flare, not on inhlaers at home  Type 2 diabetes mellitus without complication, without long-term current use of insulin (HCC) A1c 7.3% Glucoses currently okay No acidosis. - Continue SSI - Hold Prandin, jardiance, metformin  Mixed diabetic hyperlipidemia associated with type 2 diabetes mellitus (Melanie Lucas) LDL 90s Statin intolerant  Pulmonary hypertension (Melanie Lucas) See above  Coronary artery disease involving native coronary artery of native heart without angina pectoris Statin intolerant - Continue aspirin - Hold home Losartan   Essential hypertension, benign - Permissive HTN - Hold Losartan          Subjective: Patient is confused, she appears to have dyspnea with laying flat, headache, dyspnea or chest discomfort of some sort, generalized malaise.  No seizures.     Physical Exam: Vitals:   07/24/21 2148 07/24/21 2312 07/25/21 0338 07/25/21 0716  BP: (!) 143/94 (!) 150/95 (!) 155/86 100/80  Pulse: 86 87 80 72  Resp: '17 16 18 18  '$ Temp: 98.7 F (37.1 C) 98.2 F (36.8 C) (!) 97.5 F (36.4 C) 98.1 F (36.7 C)  TempSrc: Oral Oral Oral Oral  SpO2: 94% 95%  97% 97%  Weight:      Height:       Elderly adult female, sitting up in recliner, EEG leads on her head, appears restless Tachycardic, regular, I do not appreciate murmurs, she has 2+ pitting edema to the shins Respiratory rate seems elevated and shallow, lung sounds diminished She is oriented person, place, and year.  She is otherwise restless and does not attend well to our conversation, and does not answer complicated questions, upper extremity strength appears normal, face appears symmetric, speech sounds somewhat dysarthric      Data Reviewed: Neurology notes reviewed, nursing notes reviewed, vital signs reviewed Basic metabolic panel shows normal renal function electrolytes, LFTs normal Complete blood count is normal B12 and TSH normal MRI report reviewed and summarized above CT angiogram report reviewed and summarized above    Family communication: Daughter by phone     Disposition: Status is: Should be transitioned to ipatient The patient was admitted with new stroke.  She also has atrial fibrillation with RVR up to the 160s, provoking CHF, as well as persistent acute metabolic encephalopathy.  She will need management of her A-fib and CHF, as well as work-up for stroke.  Subsequent to that she will need PT evaluation for possible disposition           Author: Edwin Dada, MD 07/25/2021 9:22 AM  For on call review [email or web address].

## 2021-07-25 NOTE — Assessment & Plan Note (Addendum)
EF 55-60% in Jan 2023, grade 1 DD and normal valves at that time. -Patient has chronic lower extremity lymphedema at baseline - average of 30 mEq KCL given daily; will resume lasix at discharge also - continue with KCL 20 mEq daily and recheck BMP for stability

## 2021-07-25 NOTE — Assessment & Plan Note (Addendum)
LDL 91 -Statin intolerant.  Continue Zetia

## 2021-07-25 NOTE — Progress Notes (Signed)
Patient confused, removing her IVs, and is unable to be redirected. Plan to use soft restraints for now and remove as soon as possible.

## 2021-07-25 NOTE — Assessment & Plan Note (Signed)
Patient concerned about "cracking" in her neck after a recent fall.  MRI C-spine unremarkable.

## 2021-07-25 NOTE — Assessment & Plan Note (Addendum)
A1c 7.3% - resume home regimen

## 2021-07-25 NOTE — Progress Notes (Signed)
OT Cancellation Note  Patient Details Name: Melanie Lucas MRN: [id number] DOB: [birth year]   Cancelled Treatment:    Reason Eval/Treat Not Completed: Patient not medically ready (RN reporting pt's HR not controlled at this time, asking therapy hold off. Will reattempt as schedule allows)  Lynnda Child, OTD, OTR/L Acute Rehab [phone]) [phone] - [place] 07/25/2021, 10:02 AM

## 2021-07-25 NOTE — Plan of Care (Signed)

## 2021-07-25 NOTE — Assessment & Plan Note (Addendum)
-   Developed RVR after admission and was started on Cardizem drip - Continue Tikosyn - Continue Eliquis -Suspect some tachycardia is induced by underlying anxiety/agitation especially from being in restraints.  Resume on low-dose benzo for now for further agitation - patient evaluated by EP cardiology and underwent reloading of Tikosyn due to missed doses; plan to continue Tikosyn 250 mg BID at discharge due to renal function (prior home dose noted was 500 mg BID) - continued on KCL at discharge, see CHF; needs intermittent BMP check

## 2021-07-25 NOTE — Assessment & Plan Note (Addendum)
MRI shows acute and subacute infarcts in L BG and corona radiata.  Non-invasive angiography shows no severe intracranial stenoses, and <50% carotids, old LEFT CEA, moderate neck atherosclerosis.  -Echocardiogram done 5 months ago, will not repeat at this time -Lipids ordered, statin intolerant - Continue aspirin - Hold Eliquis - Afib known prior to admission, see below  -tPA not given because outside window -Dysphagia screen ordered in ER -PT eval ordered  -Smoking cessation: nicotine ordered

## 2021-07-25 NOTE — Assessment & Plan Note (Addendum)
Continue losartan. 

## 2021-07-25 NOTE — Progress Notes (Signed)
EEG complete - results pending 

## 2021-07-25 NOTE — Progress Notes (Signed)
SLP Cancellation Note  Patient Details Name: Melanie Lucas MRN: [id number] DOB: June 06, 1950   Cancelled treatment:       Reason Eval/Treat Not Completed: Patient not medically ready. Checked in with pt this am and clarified orders with MD, who confirms that he wants a speech/language evaluation (not swallowing). He advises coming back when she is more stable though, so will return as able.     Osie Bond., M.A. [facility] Office [phone]  Secure chat preferred  07/25/2021, 9:59 AM

## 2021-07-25 NOTE — Assessment & Plan Note (Signed)
Troponin leak due to rapid afib.  Myocardial infarction ruled out, no further ischemic work up at this time.

## 2021-07-25 NOTE — Hospital Course (Addendum)
Melanie Lucas is a 71 y.o. female with pAF on Eliquis and dofetilide, recent stroke Jan 2023, CAD s/p PCI remote, DM, PVD carotid, HTN, dCHF and smoking who presented with headache, neck pain and confusion.  MRI brain showed acute/early subacute infarct involving left basal ganglia and left corona radiata.  Unchanged C5-7 ACDF with moderate right neural foraminal stenosis and mild spinal canal stenosis at C6-7 level.  Large area of myelomalacia within the cervical spinal cord at C5-6 levels, unchanged. Neurology was also consulted on admission. See below for further problem based plan.

## 2021-07-25 NOTE — Evaluation (Signed)
Occupational Therapy Evaluation Patient Details Name: Melanie Lucas MRN: [id number] DOB: 1950/04/16 Today's Date: 07/25/2021   History of Present Illness Pt is a 71 y/o F presenting to [facility] ED with posterior head and neck pain, confusion, and wordfinding difficulties. Transferred to [facility] for further workup. EEG showing cortical dysfunction of L hemisphere, MRI revealing acute/subacute infarcts in L basal ganglia and corona radiata. PMH includes A fib on Eliquis, R occipital CVA (02/2021), R ear deafness, HTN, DM2, CAD, and HLD.   Clinical Impression   Pt reports independent at baseline with ADLs/IADLs and uses rollator for functional mobility, pt questionable historian and unable to reach family via phone, home info/PLOF pulled from chart review from 02/2021 admission. Pt lives with roommate who recently moved in to assist with IADLs including med mgmt. Pt  currently supervision - min A for ADLs and transfers this session, with mild RUE weakness and suspected visual deficits. Pt with decreased cognition able to complete routine tasks with min-mod cues for initiation, difficulty when challenged with non-routine tasks. Pt presenting with impairments listed below, will follow acutely. Recommend SNF at d/c, unless family/friends able to provide increased support at home.     Recommendations for follow up therapy are one component of a multi-disciplinary discharge planning process, led by the attending physician.  Recommendations may be updated based on patient status, additional functional criteria and insurance authorization.   Follow Up Recommendations  Skilled nursing-short term rehab (<3 hours/day)    Assistance Recommended at Discharge Intermittent Supervision/Assistance  Patient can return home with the following Direct supervision/assist for medications management;Direct supervision/assist for financial management;A little help with bathing/dressing/bathroom;Assistance with  cooking/housework;Help with stairs or ramp for entrance;Assist for transportation    Functional Status Assessment  Patient has had a recent decline in their functional status and demonstrates the ability to make significant improvements in function in a reasonable and predictable amount of time.  Equipment Recommendations  BSC/3in1    Recommendations for Other Services PT consult     Precautions / Restrictions Precautions Precautions: Fall Restrictions Weight Bearing Restrictions: No      Mobility Bed Mobility               General bed mobility comments: OOB in chair upon arrival    Transfers Overall transfer level: Needs assistance Equipment used: Rolling walker (2 wheels) Transfers: Sit to/from Stand Sit to Stand: Supervision                  Balance Overall balance assessment: Needs assistance Sitting-balance support: Bilateral upper extremity supported, Feet supported Sitting balance-Leahy Scale: Good Sitting balance - Comments: can reach outside BOS without LOB   Standing balance support: During functional activity, Reliant on assistive device for balance Standing balance-Leahy Scale: Fair Standing balance comment: moderately reliant on external support                           ADL either performed or assessed with clinical judgement   ADL Overall ADL's : Needs assistance/impaired Eating/Feeding: Set up;Sitting Eating/Feeding Details (indicate cue type and reason): able to unwrap/eat cracker Grooming: Brushing hair;Sitting;Supervision/safety   Upper Body Bathing: Supervision/ safety;Sitting   Lower Body Bathing: Supervison/ safety;Sit to/from stand;Sitting/lateral leans   Upper Body Dressing : Minimal assistance;Sitting Upper Body Dressing Details (indicate cue type and reason): for buttons/leads Lower Body Dressing: Minimal assistance;Sitting/lateral leans;Sit to/from stand   Toilet Transfer: Supervision/safety;Rolling walker (2  wheels);Regular Toilet;Ambulation Toilet Transfer Details (indicate cue type and  reason): simulated in room Toileting- Clothing Manipulation and Hygiene: Supervision/safety;Sit to/from stand Toileting - Clothing Manipulation Details (indicate cue type and reason): for pericare     Functional mobility during ADLs: Min guard;Rolling walker (2 wheels)       Vision   Vision Assessment?: Yes Eye Alignment: Within Functional Limits Alignment/Gaze Preference: Within Defined Limits Tracking/Visual Pursuits: Impaired - to be further tested in functional context Visual Fields: Impaired-to be further tested in functional context Additional Comments: unable to participate in formal visual assessment, able to idetify 2/7 numbers held up in different visual fields quadrants correctly, incorrectly identifies numbers held up in L visual field consistently     Perception     Praxis      Pertinent Vitals/Pain Pain Assessment Pain Assessment: No/denies pain     Hand Dominance     Extremity/Trunk Assessment Upper Extremity Assessment Upper Extremity Assessment: Generalized weakness (R > L weakness, functional for tasks assessed)   Lower Extremity Assessment Lower Extremity Assessment: Defer to PT evaluation (bil foot edema noted, RN aware)   Cervical / Trunk Assessment Cervical / Trunk Assessment: Normal   Communication Communication Communication: Expressive difficulties   Cognition Arousal/Alertness: Awake/alert Behavior During Therapy: Impulsive, Restless, Flat affect Overall Cognitive Status: Impaired/Different from baseline Area of Impairment: Orientation, Following commands, Safety/judgement, Awareness, Problem solving, Memory, Attention                 Orientation Level: Place, Situation Current Attention Level: Focused Memory: Decreased short-term memory Following Commands: Follows one step commands inconsistently Safety/Judgement: Decreased awareness of deficits,  Decreased awareness of safety Awareness: Emergent, Intellectual Problem Solving: Slow processing, Requires verbal cues, Difficulty sequencing, Decreased initiation General Comments: able to complete routine ADL tasks without difficulty, follows single step commands inconsistently as pt often needing repetition to initiate tasks, difficulty when challenged/non-routine     General Comments  VSS on RA    Exercises     Shoulder Instructions      Home Living Family/patient expects to be discharged to:: Private residence Living Arrangements: Non-relatives/Friends (roommate) Available Help at Discharge: Family;Available PRN/intermittently (daughters come by intermittently) Type of Home: House Home Access: Stairs to enter [employer] of Steps: 5 Entrance Stairs-Rails: Right;Left Home Layout: One level     Bathroom Shower/Tub: Teacher, early years/pre: Standard Bathroom Accessibility: Yes   Home Equipment: Rollator (4 wheels);Kasandra Knudsen - single point   Additional Comments: info obtained per chart review 02/2021 given pt questionable historian      Prior Functioning/Environment Prior Level of Function : Independent/Modified Independent;Driving;History of Falls (last six months)             Mobility Comments: uses rollator ADLs Comments: does IADLs, still drives        OT Problem List: Decreased strength;Decreased range of motion;Decreased activity tolerance;Impaired balance (sitting and/or standing);Impaired vision/perception;Decreased coordination;Decreased cognition;Decreased safety awareness;Decreased knowledge of use of DME or AE      OT Treatment/Interventions: Self-care/ADL training;Therapeutic exercise;Energy conservation;DME and/or AE instruction;Cognitive remediation/compensation;Visual/perceptual remediation/compensation;Patient/family education;Balance training;Therapeutic activities    OT Goals(Current goals can be found in the care plan section)  Acute Rehab OT Goals Patient Stated Goal: none stated OT Goal Formulation: With patient Time For Goal Achievement: 08/08/21 Potential to Achieve Goals: Good  OT Frequency: Min 2X/week    Co-evaluation              AM-PAC OT "6 Clicks" Daily Activity     Outcome Measure Help from another person eating meals?: A Little Help from another  person taking care of personal grooming?: A Little Help from another person toileting, which includes using toliet, bedpan, or urinal?: A Little Help from another person bathing (including washing, rinsing, drying)?: A Little Help from another person to put on and taking off regular upper body clothing?: A Little Help from another person to put on and taking off regular lower body clothing?: A Little 6 Click Score: 18   End of Session Equipment Utilized During Treatment: Rolling walker (2 wheels) Nurse Communication: Mobility status  Activity Tolerance: Patient tolerated treatment well Patient left: Other (comment) (up in room, handoff to PT)  OT Visit Diagnosis: Unsteadiness on feet (R26.81);Other abnormalities of gait and mobility (R26.89);Muscle weakness (generalized) (M62.81);Low vision, both eyes (H54.2);Other symptoms and signs involving cognitive function;Cognitive communication deficit (R41.841)                Time: 6195-0932 OT Time Calculation (min): 25 min Charges:  OT General Charges $OT Visit: 1 Visit OT Evaluation $OT Eval Moderate Complexity: 1 Mod OT Treatments $Self Care/Home Management : 8-22 mins  Lynnda Child, OTD, OTR/L Acute Rehab [phone]) [phone] - [place] 07/25/2021, 4:56 PM

## 2021-07-25 NOTE — Progress Notes (Signed)
Patient with elevated HR, SOB and agitation. Attending provider at bedside and new orders received and implemented. Sitter ordered and will be at bedside by 11. Will continue to assess   07/25/21 0922  Assess: MEWS Score  Temp 98 F (36.7 C)  BP 139/79  ECG Heart Rate (!) 125  Resp (!) 21  Level of Consciousness Alert  SpO2 98 %  O2 Device Room Air  Assess: MEWS Score  MEWS Temp 0  MEWS Systolic 0  MEWS Pulse 2  MEWS RR 1  MEWS LOC 0  MEWS Score 3  MEWS Score Color Yellow  Assess: if the MEWS score is Yellow or Red  Were vital signs taken at a resting state? Yes  Focused Assessment No change from prior assessment  Does the patient meet 2 or more of the SIRS criteria? No  MEWS guidelines implemented *See Row Information* Yes  Treat  MEWS Interventions Administered scheduled meds/treatments;Escalated (See documentation below)  Pain Scale 0-10  Pain Score 4  Pain Type Acute pain  Pain Location Generalized  Take Vital Signs  Increase Vital Sign Frequency  Yellow: Q 2hr X 2 then Q 4hr X 2, if remains yellow, continue Q 4hrs  Escalate  MEWS: Escalate Yellow: discuss with charge nurse/RN and consider discussing with provider and RRT  Notify: Charge Nurse/RN  Name of Charge Nurse/RN Notified Olivia Mackie  Date Charge Nurse/RN Notified 07/25/21  Time Charge Nurse/RN Notified 0900  Notify: Provider  Provider Name/Title Dr. Loleta Books  Date Provider Notified 07/25/21  Time Provider Notified 0900  Method of Notification Page  Notification Reason Change in status  Provider response At bedside  Date of Provider Response 07/25/21  Time of Provider Response 0910  Document  Progress note created (see row info) Yes  Assess: SIRS CRITERIA  SIRS Temperature  0  SIRS Pulse 1  SIRS Respirations  1  SIRS WBC 0  SIRS Score Sum  2

## 2021-07-25 NOTE — Progress Notes (Signed)
vLTM discontinue   No skin breakdown noted at all skin sites.  [facility] notified

## 2021-07-25 NOTE — Assessment & Plan Note (Signed)
See above

## 2021-07-25 NOTE — Procedures (Addendum)
Patient Name: Melanie Lucas  MRN: [id number]  Epilepsy Attending: Lora Havens  Referring Physician/Provider: Regan Lemming, MD Duration: 07/25/2021 2505 to 1424   Patient history:  71 y.o. female who presents with occipital and neck pain since fall on Saturday along with intermittent episodes of confusion and aphasia. EEG to evaluate for seizure   Level of alertness: Awake, asleep   AEDs during EEG study: None   Technical aspects: This EEG study was done with scalp electrodes positioned according to the 10-20 International system of electrode placement. Electrical activity was acquired at a sampling rate of '500Hz'$  and reviewed with a high frequency filter of '70Hz'$  and a low frequency filter of '1Hz'$ . EEG data were recorded continuously and digitally stored.    Description: The posterior dominant rhythm consists of 8 Hz activity of moderate voltage (25-35 uV) seen predominantly in posterior head regions, symmetric and reactive to eye opening and eye closing.  EEG showed intermittent generalized and lateralized left hemisphere 3 to 6 Hz theta and delta slowing.     ABNORMALITY - Intermittent slow, generalized and lateralized left hemisphere   IMPRESSION: This study is suggestive of cortical dysfunction in left hemisphere which is nonspecific but could be secondary to underlying stroke.  Additionally there is mild diffuse encephalopathy, nonspecific etiology.  No seizures or epileptiform discharges were seen throughout the recording.   Melanie Lucas

## 2021-07-25 NOTE — Progress Notes (Signed)
LTM EEG hooked up and running - no initial skin breakdown - push button tested - neuro notified. [facility] monitoring.  

## 2021-07-25 NOTE — Progress Notes (Signed)
Pts tele monitor shows a-fib 140s-150s. Pt asymptomatic sleeping. Provider [place] notified, see orders . IV metop given .ECG obtained.  HR now 80. Will continue to monitor , call bell within reach.

## 2021-07-26 DIAGNOSIS — I639 Cerebral infarction, unspecified: Secondary | ICD-10-CM | POA: Diagnosis not present

## 2021-07-26 LAB — HEPATIC FUNCTION PANEL
ALT: 19 U/L (ref 0–44)
AST: 19 U/L (ref 15–41)
Albumin: 3.3 g/dL — ABNORMAL LOW (ref 3.5–5.0)
Alkaline Phosphatase: 68 U/L (ref 38–126)
Bilirubin, Direct: 0.2 mg/dL (ref 0.0–0.2)
Indirect Bilirubin: 1.1 mg/dL — ABNORMAL HIGH (ref 0.3–0.9)
Total Bilirubin: 1.3 mg/dL — ABNORMAL HIGH (ref 0.3–1.2)
Total Protein: 5.8 g/dL — ABNORMAL LOW (ref 6.5–8.1)

## 2021-07-26 LAB — BASIC METABOLIC PANEL
Anion gap: 10 (ref 5–15)
BUN: 18 mg/dL (ref 8–23)
CO2: 22 mmol/L (ref 22–32)
Calcium: 8.9 mg/dL (ref 8.9–10.3)
Chloride: 106 mmol/L (ref 98–111)
Creatinine, Ser: 0.67 mg/dL (ref 0.44–1.00)
GFR, Estimated: >60 mL/min (ref >60)
Glucose, Bld: 111 mg/dL — ABNORMAL HIGH (ref 70–99)
Potassium: 3.8 mmol/L (ref 3.5–5.1)
Sodium: 138 mmol/L (ref 135–145)

## 2021-07-26 LAB — CBC
HCT: 46.9 % — ABNORMAL HIGH (ref 36.0–46.0)
Hemoglobin: 16.6 g/dL — ABNORMAL HIGH (ref 12.0–15.0)
MCH: 32.1 pg (ref 26.0–34.0)
MCHC: 35.4 g/dL (ref 30.0–36.0)
MCV: 90.7 fL (ref 80.0–100.0)
Platelets: 235 10*3/uL (ref 150–400)
RBC: 5.17 MIL/uL — ABNORMAL HIGH (ref 3.87–5.11)
RDW: 12.6 % (ref 11.5–15.5)
WBC: 8.9 10*3/uL (ref 4.0–10.5)
nRBC: 0 % (ref 0.0–0.2)

## 2021-07-26 LAB — GLUCOSE, CAPILLARY
Glucose-Capillary: 119 mg/dL — ABNORMAL HIGH (ref 70–99)
Glucose-Capillary: 196 mg/dL — ABNORMAL HIGH (ref 70–99)
Glucose-Capillary: 208 mg/dL — ABNORMAL HIGH (ref 70–99)
Glucose-Capillary: 231 mg/dL — ABNORMAL HIGH (ref 70–99)
Glucose-Capillary: 251 mg/dL — ABNORMAL HIGH (ref 70–99)

## 2021-07-26 MED ORDER — LOSARTAN POTASSIUM 50 MG PO TABS
50.0000 mg | ORAL_TABLET | Freq: Every day | ORAL | Status: DC
  Administered 2021-07-27 – 2021-08-01 (×6): 50 mg via ORAL
  Filled 2021-07-26 (×6): qty 1

## 2021-07-26 MED ORDER — LORAZEPAM 0.5 MG PO TABS
0.5000 mg | ORAL_TABLET | Freq: Two times a day (BID) | ORAL | Status: DC | PRN
Start: 2021-07-26 — End: 2021-08-02
  Administered 2021-07-26 – 2021-07-31 (×7): 0.5 mg via ORAL
  Filled 2021-07-26 (×7): qty 1

## 2021-07-26 NOTE — Plan of Care (Signed)
  Problem: Coping: Goal: Ability to adjust to condition or change in health will improve Outcome: Progressing   Problem: Skin Integrity: Goal: Risk for impaired skin integrity will decrease Outcome: Progressing   Problem: Activity: Goal: Risk for activity intolerance will decrease Outcome: Progressing   

## 2021-07-26 NOTE — Progress Notes (Signed)
I/O cath performed per order/protocol. 2100 CC were were voided. Distention improved. Denies any pain, will continue to monitor.

## 2021-07-26 NOTE — Plan of Care (Signed)
  Problem: Education: Goal: Ability to describe self-care measures that may prevent or decrease complications (Diabetes Survival Skills Education) will improve 07/26/2021 2313 by Laurance Flatten, RN Outcome: Not Progressing 07/26/2021 2313 by Laurance Flatten, RN Outcome: Progressing Goal: Individualized Educational Video(s) 07/26/2021 2313 by Laurance Flatten, RN Outcome: Not Progressing 07/26/2021 2313 by Laurance Flatten, RN Outcome: Progressing

## 2021-07-26 NOTE — Progress Notes (Signed)
Progress Note    [name]   SJG:[id number]  DOB: [birth year]  DOA: 07/24/2021     1 PCP: Kristen Loader, FNP  Initial CC: Confusion  Hospital Course: Melanie Lucas is a 71 y.o. female with pAF on Eliquis and dofetilide, recent stroke Jan 2023, CAD s/p PCI remote, DM, PVD carotid, HTN, dCHF and smoking who presented with headache, neck pain and confusion.  MRI brain showed acute/early subacute infarct involving left basal ganglia and left corona radiata.  Unchanged C5-7 ACDF with moderate right neural foraminal stenosis and mild spinal canal stenosis at C6-7 level.  Large area of myelomalacia within the cervical spinal cord at C5-6 levels, unchanged. Neurology was also consulted on admission.  Interval History:  Patient noted to be lying in bed this morning being fed breakfast.  She would not engage in conversation or follow commands.  She was staring off to the right. Spoke with daughter this afternoon when she was visiting patient.  We discussed plan of care and patient's prior baseline level of functioning.  Family is amenable with patient going to rehab at discharge.  Assessment and Plan: * Acute ischemic stroke ([place]) - MRI shows acute and subacute infarcts in L BG and corona radiata.  Non-invasive angiography shows no severe intracranial stenoses -Continue Eliquis -Continue Zetia (statin intolerant) - Evaluated by neurology, appreciate assistance -Evaluated by PT, plan will be for SNF at discharge  Paroxysmal atrial fibrillation (HCC) - Developed RVR after admission and was started on Cardizem drip - Continue Tikosyn - Continue Eliquis -Suspect some tachycardia is induced by underlying anxiety/agitation especially from being in restraints.  Resume on low-dose benzo for now for further agitation  Acute on chronic diastolic CHF (congestive heart failure) (HCC) EF 55-60% in Jan 2023, grade 1 DD and normal valves at that time. -Patient has chronic lower extremity lymphedema at  baseline  Acute metabolic encephalopathy - some probable underlying degree of cognitive impairment per discussion with daughter; patient did become agitated with last CVA, attempting to get OOB and pull at IV lines, etc.  - suspect she will have a component of delirium given underlying cognitive impairment -Continue low-dose Ativan for agitation - Continue sitter - Continue soft wrist restraints.  Attempt to discontinue roll belt to see if tolerates  Neck pain Patient concerned about "cracking" in her neck after a recent fall.  MRI C-spine unremarkable.  Acquired thrombophilia ([place]) - Continue Eliquis  Myocardial injury Troponin leak due to rapid afib.  Myocardial infarction ruled out, no further ischemic work up at this time.  Nicotine dependence, cigarettes, uncomplicated - Nicotine patch  COPD (chronic obstructive pulmonary disease) (HCC) No active flare, not on inhlaers at home  Type 2 diabetes mellitus without complication, without long-term current use of insulin (HCC) A1c 7.3% - Continue SSI - Hold Prandin, jardiance, metformin  Mixed diabetic hyperlipidemia associated with type 2 diabetes mellitus (HCC) LDL 91 -Statin intolerant.  Continue Zetia  Pulmonary hypertension ([place]) See above  Coronary artery disease involving native coronary artery of native heart without angina pectoris Statin intolerant -Continue Eliquis, Zetia -Resume losartan  Essential hypertension, benign - Resume losartan tomorrow   Old records reviewed in assessment of this patient  Antimicrobials:   DVT prophylaxis:  SCDs Start: 07/24/21 2309 apixaban (ELIQUIS) tablet 5 mg   Code Status:   Code Status: Full Code  Disposition Plan: SNF Status is: Inpatient  Objective: Blood pressure (!) (P) 145/98, pulse (!) (P) 120, temperature (P) 98.2 F (36.8 C), temperature source (  P) Oral, resp. rate (P) 20, height '5\' 7"'$  (1.702 m), weight 66.2 kg, SpO2 (P) 92 %.  Examination:  Physical  Exam Constitutional:      General: She is not in acute distress. HENT:     Head: Normocephalic and atraumatic.     Mouth/Throat:     Mouth: Mucous membranes are moist.  Eyes:     Extraocular Movements: Extraocular movements intact.  Cardiovascular:     Rate and Rhythm: Normal rate and regular rhythm.  Pulmonary:     Effort: Pulmonary effort is normal.     Breath sounds: Normal breath sounds.  Abdominal:     General: Bowel sounds are normal. There is no distension.     Palpations: Abdomen is soft.     Tenderness: There is no abdominal tenderness.  Musculoskeletal:        General: Normal range of motion.     Cervical back: Normal range of motion and neck supple.  Skin:    General: Skin is warm and dry.  Neurological:     Comments: Staring to the right; does not follow commands; moves all 4 extremities      Consultants:  Neurology  Procedures:    Data Reviewed: Results for orders placed or performed during the hospital encounter of 07/24/21 (from the past 24 hour(s))  Glucose, capillary     Status: Abnormal   Collection Time: 07/25/21  9:19 PM  Result Value Ref Range   Glucose-Capillary 153 (H) 70 - 99 mg/dL  Glucose, capillary     Status: Abnormal   Collection Time: 07/26/21 12:12 AM  Result Value Ref Range   Glucose-Capillary 196 (H) 70 - 99 mg/dL  Basic metabolic panel     Status: Abnormal   Collection Time: 07/26/21  5:49 AM  Result Value Ref Range   Sodium 138 135 - 145 mmol/L   Potassium 3.8 3.5 - 5.1 mmol/L   Chloride 106 98 - 111 mmol/L   CO2 22 22 - 32 mmol/L   Glucose, Bld 111 (H) 70 - 99 mg/dL   BUN 18 8 - 23 mg/dL   Creatinine, Ser 0.67 0.44 - 1.00 mg/dL   Calcium 8.9 8.9 - 10.3 mg/dL   GFR, Estimated >60 >60 mL/min   Anion gap 10 5 - 15  CBC     Status: Abnormal   Collection Time: 07/26/21  5:49 AM  Result Value Ref Range   WBC 8.9 4.0 - 10.5 K/uL   RBC 5.17 (H) 3.87 - 5.11 MIL/uL   Hemoglobin 16.6 (H) 12.0 - 15.0 g/dL   HCT 46.9 (H) 36.0 - 46.0  %   MCV 90.7 80.0 - 100.0 fL   MCH 32.1 26.0 - 34.0 pg   MCHC 35.4 30.0 - 36.0 g/dL   RDW 12.6 11.5 - 15.5 %   Platelets 235 150 - 400 K/uL   nRBC 0.0 0.0 - 0.2 %  Hepatic function panel     Status: Abnormal   Collection Time: 07/26/21  5:49 AM  Result Value Ref Range   Total Protein 5.8 (L) 6.5 - 8.1 g/dL   Albumin 3.3 (L) 3.5 - 5.0 g/dL   AST 19 15 - 41 U/L   ALT 19 0 - 44 U/L   Alkaline Phosphatase 68 38 - 126 U/L   Total Bilirubin 1.3 (H) 0.3 - 1.2 mg/dL   Bilirubin, Direct 0.2 0.0 - 0.2 mg/dL   Indirect Bilirubin 1.1 (H) 0.3 - 0.9 mg/dL  Glucose, capillary  Status: Abnormal   Collection Time: 07/26/21  6:46 AM  Result Value Ref Range   Glucose-Capillary 119 (H) 70 - 99 mg/dL  Glucose, capillary     Status: Abnormal   Collection Time: 07/26/21 11:16 AM  Result Value Ref Range   Glucose-Capillary 231 (H) 70 - 99 mg/dL  Glucose, capillary     Status: Abnormal   Collection Time: 07/26/21  4:37 PM  Result Value Ref Range   Glucose-Capillary 208 (H) 70 - 99 mg/dL    I have Reviewed nursing notes, Vitals, and Lab results since pt's last encounter. Pertinent lab results : see above I have ordered test including BMP, CBC, Mg I have reviewed the last note from staff over past 24 hours I have discussed pt's care plan and test results with nursing staff, case manager   LOS: 1 day   Dwyane Dee, MD [facility] 07/26/2021, 6:10 PM

## 2021-07-26 NOTE — Progress Notes (Signed)
Mid shift Night Note  Patient restless after family left, patient repeatedly getting up, sitting in the recliner, going to the bathroom or trying to leave room. Patient AAOx2 at 2047  Stating "I want to go walk my dogs, they are on the porch." As she pointed to the window. She also explained that she wanted to go out and buy a cigarette, to which I told her she could not smoke in the hospital. She said "I am leaving here [home] and getting a cigarette. I smoke in front of my family." At 2108  She then would not sit down in the bed and pushing staff, grabbing staff and striking staff when we would not allow her to leave the room. Opyd MD was paged and BL wrist with Posey belt restraints were ordered. Patient continued to be restless. [place] (Daughter) concerning patient now in restraints and she agreed that her mother's safety is important. She also suggested a medication to calm her down. I again paged Opyd MD, whom gave a one-time dose of Ativan.   Patient resting as of 0148.

## 2021-07-26 NOTE — Plan of Care (Signed)
  Problem: Coping: Goal: Ability to adjust to condition or change in health will improve Outcome: Progressing   Problem: Skin Integrity: Goal: Risk for impaired skin integrity will decrease 07/26/2021 1832 by Asencion Partridge, RN Outcome: Progressing 07/26/2021 1048 by Asencion Partridge, RN Outcome: Progressing   Problem: Clinical Measurements: Goal: Diagnostic test results will improve Outcome: Progressing Goal: Respiratory complications will improve Outcome: Progressing Goal: Cardiovascular complication will be avoided Outcome: Progressing   Problem: Activity: Goal: Risk for activity intolerance will decrease 07/26/2021 1832 by Asencion Partridge, RN Outcome: Progressing 07/26/2021 1048 by Asencion Partridge, RN Outcome: Progressing

## 2021-07-26 NOTE — Progress Notes (Addendum)
STROKE TEAM PROGRESS NOTE   INTERVAL HISTORY Patient is seen in her room with no one at the bedside.   Vitamin B-12, TSH and folate are normal.  LDL is elevated at 125 mg percent.  Patient has history of statin intolerance since she has been started on Zetia.  Eliquis has been restarted. Vital signs stable.  Neurological exam unchanged. Vitals:   07/26/21 0124 07/26/21 0259 07/26/21 0501 07/26/21 0803  BP: (!) 150/99 (!) 145/108 (!) 132/96 (!) (P) 126/99  Pulse: 92 (!) 110 (!) 105 (!) (P) 111  Resp:  20 20 (P) 20  Temp: 98.1 F (36.7 C)  98.5 F (36.9 C) (P) 99.1 F (37.3 C)  TempSrc: Axillary  Oral (P) Oral  SpO2: 95% 98% 94% 95%  Weight:      Height:       CBC:  Recent Labs  Lab 07/24/21 1358 07/25/21 0315 07/26/21 0549  WBC 6.1 6.6 8.9  NEUTROABS 3.9 4.3  --   HGB 15.9* 15.6* 16.6*  HCT 47.3* 46.2* 46.9*  MCV 92.9 93.7 90.7  PLT 207 209 709   Basic Metabolic Panel:  Recent Labs  Lab 07/25/21 0315 07/26/21 0549  NA 141 138  K 4.1 3.8  CL 103 106  CO2 28 22  GLUCOSE 172* 111*  BUN 15 18  CREATININE 0.75 0.67  CALCIUM 8.8* 8.9  MG 2.0  --    Lipid Panel:  Recent Labs  Lab 07/25/21 0315  CHOL 181  TRIG 205*  HDL 49  CHOLHDL 3.7  VLDL 41*  LDLCALC 91   HgbA1c:  Recent Labs  Lab 07/25/21 0315  HGBA1C 7.3*   Urine Drug Screen: No results for input(s): LABOPIA, COCAINSCRNUR, LABBENZ, AMPHETMU, THCU, LABBARB in the last 168 hours.  Alcohol Level No results for input(s): ETH in the last 168 hours.  IMAGING past 24 hours No results found.  PHYSICAL EXAM General:  Alert, well-developed, well-nourished elderly patient in no acute distress Respiratory:  Regular, unlabored respirations on room air  NEURO:  Mental Status: AA&Ox2, diminished recall..  Diminished attention, registration follows simple midline and occasional one-step commands only Speech/Language: speech is slow with delayed responses and some difficulty getting words out  Cranial Nerves:   II: PERRL.  III, IV, VI: EOMI. Eyelids elevate symmetrically.  V: Sensation is intact to light touch and symmetrical to face.  VII: Smile is symmetrical.  VIII: hearing intact to voice. IX, X: Phonation is normal.  XII: tongue is midline without fasciculations. Motor: 5/5 strength to all muscle groups tested.  Tone: is normal and bulk is normal Sensation- Intact to light touch bilaterally.  Coordination: FTN intact bilaterally.No drift.  Gait- deferred   ASSESSMENT/PLAN Melanie Lucas is a 71 y.o. female with history of DM2, neuropathy, stroke, tobacco abuse, memory and balance deficits and atrial fibrillation on Eliquis presenting with worsening aphasia and confusion.  Patient is able to state that she is in the hospital for a stroke but cannot describe her symptoms.  Family friend in the room states that her speech is at baseline.  She did fall at home and strike her head, but MRI of c-spine reveals no acute changes.  MRI brain reveals acute left basal ganglia lacunar infarct.  Stroke:  left lacunar basal ganglia infarct likely  secondary to small vessel disease.  Prior history of multiple strokes and chronic A-fib CT head No acute abnormality. Small vessel disease.  CTA head & neck 30% stenosis of right ICA, 40% stenosis  of left ICA distal to endarterectomy segment, no intracranial LVO MRI  acute infarct in left basal ganglia/corona radiata 2D Echo pending LDL 91 HgbA1c 7.3 VTE prophylaxis - SCDs    Diet   Diet heart healthy/carb modified Room service appropriate? Yes; Fluid consistency: Thin   Eliquis (apixaban) daily prior to admission, now on aspirin 81 mg daily. Rerestarted Eliquis.  No need for additional aspirin Therapy recommendations:  pending Disposition:  pending  Atrial fibrillation Patient has history of PAF, on dofetilide OK to resume home Eliquis   Hypertension Home meds:  losartan 50 mg daily Stable Permissive hypertension (OK if < 220/120) but gradually  normalize in 5-7 days Long-term BP goal normotensive  Hyperlipidemia Home meds:  none LDL 91, goal < 70 Add zetia 10 mg daily High intensity statin not indicated due to intolerance Continue statin at discharge  Diabetes type II Uncontrolled Home meds:  repaglinide 2 mg BID, empagliflozin-metformin 12.06-998 mg BID HgbA1c 7.3, goal < 7.0 CBGs Recent Labs    07/26/21 0012 07/26/21 0646 07/26/21 1116  GLUCAP 196* 119* 231*    SSI  Other Stroke Risk Factors Advanced Age >/= 35  Cigarette smoker advised to stop smoking Nicotine patch ordered Hx stroke She has prior history of numerous strokes in 2011 and 2019.  Last stroke was in January 2023 when she had right occipital infarct.  And has chronic atrial fibrillation and was on Eliquis .she also has history of cognitive impairment  at baseline  Other Active Problems none  Hospital day # 1  Patient seen and examined by NP/APP with MD. MD to update note as needed.   Melanie Ores, DNP, FNP-BC [facility] Pager: [phone]  I have personally obtained history,examined this patient, reviewed notes, independently viewed imaging studies, participated in medical decision making and plan of care.ROS completed by me personally and pertinent positives fully documented  I have made any additions or clarifications directly to the above note. Agree with note above.  Continue Eliquis for stroke prevention.  Mobilize out of bed.  Therapy consults.  Transfer to skilled nursing facility when bed available.  Stroke team will sign off.  Kindly call for questions.  Greater than 50% time during this 35-minute visit was Counseling and Coordination of Care about Her Lacunar Stroke and Chronic Atrial Fibrillation Likely Underlying Cognitive Impairment and Answering Questions.  Antony Contras, MD Medical Director [facility] Pager: [phone] 07/26/2021 12:29 PM    To contact Stroke Continuity provider, please refer to  [email or web address] After hours, contact General Neurology

## 2021-07-27 DIAGNOSIS — I639 Cerebral infarction, unspecified: Secondary | ICD-10-CM | POA: Diagnosis not present

## 2021-07-27 LAB — GLUCOSE, CAPILLARY
Glucose-Capillary: 148 mg/dL — ABNORMAL HIGH (ref 70–99)
Glucose-Capillary: 221 mg/dL — ABNORMAL HIGH (ref 70–99)
Glucose-Capillary: 163 mg/dL — ABNORMAL HIGH (ref 70–99)
Glucose-Capillary: 259 mg/dL — ABNORMAL HIGH (ref 70–99)

## 2021-07-27 LAB — VITAMIN B1: Vitamin B1 (Thiamine): 180.4 nmol/L (ref 66.5–200.0)

## 2021-07-27 MED ORDER — LORAZEPAM 0.5 MG PO TABS
0.5000 mg | ORAL_TABLET | Freq: Once | ORAL | Status: AC
Start: 2021-07-27 — End: 2021-07-27
  Administered 2021-07-27: 0.5 mg via ORAL
  Filled 2021-07-27: qty 1

## 2021-07-27 MED ORDER — METOPROLOL TARTRATE 5 MG/5ML IV SOLN
5.0000 mg | Freq: Once | INTRAVENOUS | Status: AC
  Administered 2021-07-27: 5 mg via INTRAVENOUS
  Filled 2021-07-27: qty 5

## 2021-07-27 MED ORDER — METOPROLOL TARTRATE 5 MG/5ML IV SOLN
5.0000 mg | INTRAVENOUS | Status: DC | PRN
  Administered 2021-07-28 – 2021-07-29 (×5): 5 mg via INTRAVENOUS
  Filled 2021-07-27 (×6): qty 5

## 2021-07-27 MED ORDER — METOPROLOL TARTRATE 12.5 MG HALF TABLET
12.5000 mg | ORAL_TABLET | Freq: Two times a day (BID) | ORAL | Status: DC
  Administered 2021-07-27 (×2): 12.5 mg via ORAL
  Filled 2021-07-27 (×2): qty 1

## 2021-07-27 NOTE — NC FL2 (Signed)
[place] LEVEL OF CARE SCREENING TOOL     IDENTIFICATION  Patient Name: Melanie Lucas Birthdate: 25-Aug-1950 Sex: female Admission Date (Current Location): 07/24/2021  [facility] and [place] Number:  Herbalist and Address:  [facility], [place] [address] [place], [place] [address]      Provider Number: [id number]  Attending Physician Name and Address:  Dwyane Dee, MD  Relative Name and Phone Number:       Current Level of Care: Hospital Recommended Level of Care: [place] Prior Approval Number:    Date Approved/Denied:   PASRR Number: [id number]  Discharge Plan: SNF    Current Diagnoses: Patient Active Problem List   Diagnosis Date Noted   Acquired thrombophilia ([place]) 07/25/2021   Neck pain 07/25/2021   Acute ischemic stroke ([place]) 07/24/2021   Nicotine dependence, cigarettes, uncomplicated 01/75/1025   Myocardial injury 07/24/2021   Acute on chronic diastolic CHF (congestive heart failure) ([place]) 03/08/2021   Renal lesion 03/08/2021   Sternal fracture 03/07/2021   Hypokalemia 03/07/2021   CVA (cerebral vascular accident) ([place]) 03/07/2021   Chronic anticoagulation 03/07/2021   Left homonymous hemianopsia 03/06/2021   Stroke-like episode 85/27/7824   Acute metabolic encephalopathy 23/53/6144   Depression 12/31/2017   Stroke ([place]) 12/31/2017   COPD (chronic obstructive pulmonary disease) ([place]) 12/30/2017   Left carotid artery stenosis 07/29/2017   TIA (transient ischemic attack) 06/24/2017   Ischemic stroke ([place]) 06/23/2017   Cervical spondylosis with myelopathy 02/21/2016   Cervical stenosis of spine 11/24/2014   Numbness of fingers of both hands 11/24/2014   Mixed diabetic hyperlipidemia associated with type 2 diabetes mellitus ([place]) 11/09/2014   Type 2 diabetes mellitus without complication, without long-term current use of insulin ([place]) 11/09/2014   Pulmonary hypertension ([place]) 08/07/2013    Coronary artery disease involving native coronary artery of native heart without angina pectoris 03/11/2013   Peripheral edema 02/25/2012   Essential hypertension, benign 05/06/2009   Overweight 04/13/2009   TOBACCO ABUSE 04/13/2009   Paroxysmal atrial fibrillation ([place]) 04/13/2009    Orientation RESPIRATION BLADDER Height & Weight     Self, Place  Normal Indwelling catheter Weight: 145 lb 15.1 oz (66.2 kg) Height:  '5\' 7"'$  (170.2 cm)  BEHAVIORAL SYMPTOMS/MOOD NEUROLOGICAL BOWEL NUTRITION STATUS      Continent Diet (heart healthy/carb modified)  AMBULATORY STATUS COMMUNICATION OF NEEDS Skin   Limited Assist Verbally Normal                       Personal Care Assistance Level of Assistance  Bathing, Feeding, Dressing Bathing Assistance: Limited assistance Feeding assistance: Limited assistance Dressing Assistance: Limited assistance     Functional Limitations Info  Hearing   Hearing Info: Impaired (hard of hearing)      SPECIAL CARE FACTORS FREQUENCY  PT (By licensed PT), OT (By licensed OT), Speech therapy     PT Frequency: 5x/wk OT Frequency: 5x/wk     Speech Therapy Frequency: 5x/wk      Contractures Contractures Info: Not present    Additional Factors Info  Code Status, Allergies, Psychotropic, Insulin Sliding Scale Code Status Info: Full Allergies Info: Alendronate Sodium, Avelox (Moxifloxacin), Fosamax (Alendronate), Pradaxa (Dabigatran Etexilate Mesylate), Statins, Sulfa Antibiotics, Sulfamethoxazole, Sulfonamide Derivatives, Varenicline, Ciprofloxacin, Gabapentin, Tape Psychotropic Info: Cymbalta '60mg'$  daily Insulin Sliding Scale Info: see DC summary       Current Medications (07/27/2021):  This is the current hospital active medication list Current Facility-Administered Medications  Medication  Dose Route Frequency Provider Last Rate Last Admin   acetaminophen (TYLENOL) tablet 650 mg  650 mg Oral Q6H PRN Vernelle Emerald, MD   650 mg at 07/25/21 1649    Or   acetaminophen (TYLENOL) suppository 650 mg  650 mg Rectal Q6H PRN Shalhoub, Sherryll Burger, MD       apixaban Arne [place]) tablet 5 mg  5 mg Oral BID Edwin Dada, MD   5 mg at 07/27/21 1038   dofetilide (TIKOSYN) capsule 500 mcg  500 mcg Oral BID Vernelle Emerald, MD   500 mcg at 07/27/21 1038   DULoxetine (CYMBALTA) DR capsule 60 mg  60 mg Oral Daily Vernelle Emerald, MD   60 mg at 07/27/21 1038   ezetimibe (ZETIA) tablet 10 mg  10 mg Oral Daily de Yolanda Manges, Cortney E, NP   10 mg at 07/27/21 1038   insulin aspart (novoLOG) injection 0-15 Units  0-15 Units Subcutaneous TID AC & HS Shalhoub, Sherryll Burger, MD   5 Units at 07/27/21 1300   linaclotide (LINZESS) capsule 290 mcg  290 mcg Oral QAC breakfast Vernelle Emerald, MD   290 mcg at 07/27/21 0745   LORazepam (ATIVAN) tablet 0.5 mg  0.5 mg Oral BID PRN Dwyane Dee, MD   0.5 mg at 07/26/21 2148   losartan (COZAAR) tablet 50 mg  50 mg Oral Daily Dwyane Dee, MD   50 mg at 07/27/21 1038   metoprolol tartrate (LOPRESSOR) tablet 12.5 mg  12.5 mg Oral BID Dwyane Dee, MD   12.5 mg at 07/27/21 1038   mirabegron ER (MYRBETRIQ) tablet 50 mg  50 mg Oral Daily Shalhoub, Sherryll Burger, MD   50 mg at 07/27/21 1038   ondansetron (ZOFRAN) tablet 4 mg  4 mg Oral Q6H PRN Shalhoub, Sherryll Burger, MD       Or   ondansetron [facility]) injection 4 mg  4 mg Intravenous Q6H PRN Shalhoub, Sherryll Burger, MD       polyethylene glycol (MIRALAX / GLYCOLAX) packet 17 g  17 g Oral Daily PRN Shalhoub, Sherryll Burger, MD       sodium chloride flush (NS) 0.9 % injection 3 mL  3 mL Intravenous Q12H Shalhoub, Sherryll Burger, MD   3 mL at 07/27/21 1041     Discharge Medications: Please see discharge summary for a list of discharge medications.  Relevant Imaging Results:  Relevant Lab Results:   Additional Information SS#: [id number]  Geralynn Ochs, LCSW

## 2021-07-27 NOTE — Progress Notes (Signed)
Progress Note    Melanie Lucas   EPP:[id number]  DOB: [birth year]  DOA: 07/24/2021     2 PCP: Kristen Loader, FNP  Initial CC: Confusion  Hospital Course: Melanie Lucas is a 71 y.o. female with pAF on Eliquis and dofetilide, recent stroke Jan 2023, CAD s/p PCI remote, DM, PVD carotid, HTN, dCHF and smoking who presented with headache, neck pain and confusion.  MRI brain showed acute/early subacute infarct involving left basal ganglia and left corona radiata.  Unchanged C5-7 ACDF with moderate right neural foraminal stenosis and mild spinal canal stenosis at C6-7 level.  Large area of myelomalacia within the cervical spinal cord at C5-6 levels, unchanged. Neurology was also consulted on admission.  Interval History:  No events overnight.  Mood is more calm and pleasant today.  She is interacting a little better as well.  Restraints discontinued.  Assessment and Plan: * Acute ischemic stroke ([place]) - MRI shows acute and subacute infarcts in L BG and corona radiata.  Non-invasive angiography shows no severe intracranial stenoses -Continue Eliquis -Continue Zetia (statin intolerant) - Evaluated by neurology, appreciate assistance -Evaluated by PT, plan will be for SNF at discharge  Paroxysmal atrial fibrillation (HCC) - Developed RVR after admission and was started on Cardizem drip - Continue Tikosyn - Continue Eliquis -Suspect some tachycardia is induced by underlying anxiety/agitation especially from being in restraints.  Resume on low-dose benzo for now for further agitation -Cardizem drip maxed out today patient then developed borderline bradycardia.  Okay to discontinue drip and have initiated oral Lopressor  Acute on chronic diastolic CHF (congestive heart failure) (HCC) EF 55-60% in Jan 2023, grade 1 DD and normal valves at that time. -Patient has chronic lower extremity lymphedema at baseline  Acute metabolic encephalopathy - some probable underlying degree of cognitive impairment  per discussion with daughter; patient did become agitated with last CVA, attempting to get OOB and pull at IV lines, etc.  - suspect she will have a component of delirium given underlying cognitive impairment -Continue low-dose Ativan for agitation - Continue sitter - mood improved today; okay to d/c restraints and continue monitoring   Neck pain Patient concerned about "cracking" in her neck after a recent fall.  MRI C-spine unremarkable.  Acquired thrombophilia ([place]) - Continue Eliquis  Myocardial injury Troponin leak due to rapid afib.  Myocardial infarction ruled out, no further ischemic work up at this time.  Nicotine dependence, cigarettes, uncomplicated - Nicotine patch  COPD (chronic obstructive pulmonary disease) (HCC) No active flare, not on inhlaers at home  Type 2 diabetes mellitus without complication, without long-term current use of insulin (HCC) A1c 7.3% - Continue SSI - Hold Prandin, jardiance, metformin  Mixed diabetic hyperlipidemia associated with type 2 diabetes mellitus (HCC) LDL 91 -Statin intolerant.  Continue Zetia  Pulmonary hypertension ([place]) See above  Coronary artery disease involving native coronary artery of native heart without angina pectoris Statin intolerant -Continue Eliquis, Zetia -Resume losartan  Essential hypertension, benign - Continue losartan   Old records reviewed in assessment of this patient  Antimicrobials:   DVT prophylaxis:  SCDs Start: 07/24/21 2309 apixaban (ELIQUIS) tablet 5 mg   Code Status:   Code Status: Full Code  Disposition Plan: SNF Status is: Inpatient  Objective: Blood pressure 123/81, pulse 96, temperature 98.3 F (36.8 C), temperature source Oral, resp. rate 20, height '5\' 7"'$  (1.702 m), weight 66.2 kg, SpO2 95 %.  Examination:  Physical Exam Constitutional:      General: She is not  in acute distress. HENT:     Head: Normocephalic and atraumatic.     Mouth/Throat:     Mouth: Mucous membranes  are moist.  Eyes:     Extraocular Movements: Extraocular movements intact.  Cardiovascular:     Rate and Rhythm: Normal rate and regular rhythm.  Pulmonary:     Effort: Pulmonary effort is normal.     Breath sounds: Normal breath sounds.  Abdominal:     General: Bowel sounds are normal. There is no distension.     Palpations: Abdomen is soft.     Tenderness: There is no abdominal tenderness.  Musculoskeletal:        General: Normal range of motion.     Cervical back: Normal range of motion and neck supple.  Skin:    General: Skin is warm and dry.  Neurological:     Comments: Tracking better with eyes and follows commands and moves all 4 extremities      Consultants:  Neurology  Procedures:    Data Reviewed: Results for orders placed or performed during the hospital encounter of 07/24/21 (from the past 24 hour(s))  Glucose, capillary     Status: Abnormal   Collection Time: 07/26/21  4:37 PM  Result Value Ref Range   Glucose-Capillary 208 (H) 70 - 99 mg/dL  Glucose, capillary     Status: Abnormal   Collection Time: 07/26/21  9:04 PM  Result Value Ref Range   Glucose-Capillary 251 (H) 70 - 99 mg/dL  Glucose, capillary     Status: Abnormal   Collection Time: 07/27/21  5:51 AM  Result Value Ref Range   Glucose-Capillary 148 (H) 70 - 99 mg/dL  Glucose, capillary     Status: Abnormal   Collection Time: 07/27/21 12:13 PM  Result Value Ref Range   Glucose-Capillary 221 (H) 70 - 99 mg/dL    I have Reviewed nursing notes, Vitals, and Lab results since pt's last encounter. Pertinent lab results : see above I have ordered test including BMP, CBC, Mg I have reviewed the last note from staff over past 24 hours I have discussed pt's care plan and test results with nursing staff, case manager   LOS: 2 days   Dwyane Dee, MD [facility] 07/27/2021, 3:44 PM

## 2021-07-27 NOTE — Plan of Care (Signed)
  Problem: Tissue Perfusion: Goal: Adequacy of tissue perfusion will improve Outcome: Progressing   Problem: Nutrition: Goal: Adequate nutrition will be maintained Outcome: Progressing   Problem: Coping: Goal: Level of anxiety will decrease Outcome: Progressing   Problem: Elimination: Goal: Will not experience complications related to bowel motility Outcome: Progressing

## 2021-07-27 NOTE — Progress Notes (Signed)
Physical Therapy Treatment Patient Details Name: Melanie Lucas MRN: [id number] DOB: 08-21-1950 Today's Date: 07/27/2021   History of Present Illness Pt is a 71 y/o F presenting to [facility] ED with posterior head and neck pain, confusion, and wordfinding difficulties. Transferred to [facility] for further workup. EEG showing corical dysfunction of L hemisphere, MRI revealing acute/subacute infarcts in L basal ganglia and corona radiata. PMH includes A fib on Eliquis, R occipital CVA (02/2021), R ear deafness, HTN, DM2, CAD, and HLD.    PT Comments    Pt still having significant cognitive problems with difficulty focusing on task and following direction.  Emphasis on general safety/following simple commands with limited cues, sit to stand safety and progression of gait stability, safe use of the RW, using environmental cues for wayfinding and focus on task.    Recommendations for follow up therapy are one component of a multi-disciplinary discharge planning process, led by the attending physician.  Recommendations may be updated based on patient status, additional functional criteria and insurance authorization.  Follow Up Recommendations  Skilled nursing-short term rehab (<3 hours/day)     Assistance Recommended at Discharge Intermittent Supervision/Assistance  Patient can return home with the following A little help with walking and/or transfers;A little help with bathing/dressing/bathroom;Assistance with cooking/housework;Assist for transportation;Help with stairs or ramp for entrance   Equipment Recommendations  Other (comment) (TBA)    Recommendations for Other Services       Precautions / Restrictions Precautions Precautions: Fall     Mobility  Bed Mobility               General bed mobility comments: OOB in chair at the sink upon arrival    Transfers Overall transfer level: Needs assistance Equipment used: Rolling walker (2 wheels) Transfers: Sit to/from Stand Sit to  Stand: Min guard           General transfer comment: min cues for hand placement    Ambulation/Gait Ambulation/Gait assistance: Min assist Gait Distance (Feet): 105 Feet (then 75 and 50 feet, respectively, standing rests to attempt regrouping from degraded performance.) Assistive device: Rolling walker (2 wheels) Gait Pattern/deviations: Step-through pattern   Gait velocity interpretation: 1.31 - 2.62 ft/sec, indicative of limited community ambulator   General Gait Details: starts out relatively more steady and degrades with fatigue to mod assist with pt having tendency to get further from the RW and losing control of speed and stability.  Lots of cues needed to redirect to task.   Stairs             Wheelchair Mobility    Modified Rankin (Stroke Patients Only) Modified Rankin (Stroke Patients Only) Pre-Morbid Rankin Score: No symptoms Modified Rankin: Moderately severe disability     Balance Overall balance assessment: Needs assistance   Sitting balance-Leahy Scale: Good     Standing balance support: During functional activity, Reliant on assistive device for balance Standing balance-Leahy Scale: Fair (to poor) Standing balance comment: more reliance on external support with fatigue and loss of focus on task.                            Cognition Arousal/Alertness: Awake/alert Behavior During Therapy: Flat affect Overall Cognitive Status: Impaired/Different from baseline Area of Impairment: Attention, Following commands, Awareness                   Current Attention Level: Focused Memory: Decreased short-term memory Following Commands: Follows one step commands inconsistently Safety/Judgement:  Decreased awareness of deficits, Decreased awareness of safety Awareness: Intellectual Problem Solving: Slow processing, Difficulty sequencing, Requires verbal cues          Exercises      General Comments        Pertinent Vitals/Pain  Pain Assessment Pain Assessment: Faces Faces Pain Scale: No hurt Pain Intervention(s): Monitored during session    Home Living                          Prior Function            PT Goals (current goals can now be found in the care plan section) Acute Rehab PT Goals Patient Stated Goal: pt unable to focus on goals Time For Goal Achievement: 08/08/21 Potential to Achieve Goals: Good Progress towards PT goals: Progressing toward goals    Frequency    Min 3X/week      PT Plan Current plan remains appropriate    Co-evaluation              AM-PAC PT "6 Clicks" Mobility   Outcome Measure  Help needed turning from your back to your side while in a flat bed without using bedrails?: A Little Help needed moving from lying on your back to sitting on the side of a flat bed without using bedrails?: A Little Help needed moving to and from a bed to a chair (including a wheelchair)?: A Little Help needed standing up from a chair using your arms (e.g., wheelchair or bedside chair)?: A Little Help needed to walk in hospital room?: A Lot Help needed climbing 3-5 steps with a railing? : A Little 6 Click Score: 17    End of Session   Activity Tolerance: Patient tolerated treatment well Patient left: in chair;with call bell/phone within reach;with chair alarm set;with nursing/sitter in room Nurse Communication: Mobility status PT Visit Diagnosis: Unsteadiness on feet (R26.81);Other abnormalities of gait and mobility (R26.89);Other symptoms and signs involving the nervous system (R29.898)     Time: 7262-0355 PT Time Calculation (min) (ACUTE ONLY): 19 min  Charges:  $Gait Training: 8-22 mins                     07/27/2021  Ginger Carne., PT Acute Rehabilitation Services [phone]  (pager) [phone]  (office)   Tessie Fass Melanie Lucas 07/27/2021, 12:40 PM

## 2021-07-27 NOTE — Progress Notes (Signed)
Patient continues to be restless and agitated, removing mitts and attempting to remove  foley.    [facility] informed, see new orders.

## 2021-07-27 NOTE — Progress Notes (Signed)
Inpatient Diabetes Program Recommendations  AACE/ADA: New Consensus Statement on Inpatient Glycemic Control (2015)  Target Ranges:  Prepandial:   less than 140 mg/dL      Peak postprandial:   less than 180 mg/dL (1-2 hours)      Critically ill patients:  140 - 180 mg/dL   Lab Results  Component Value Date   GLUCAP 221 (H) 07/27/2021   HGBA1C 7.3 (H) 07/25/2021    Latest Reference Range & Units 07/26/21 06:46 07/26/21 11:16 07/26/21 16:37 07/26/21 21:04 07/27/21 05:51 07/27/21 12:13  Glucose-Capillary 70 - 99 mg/dL 119 (H) 231 (H) 208 (H) 251 (H) 148 (H) 221 (H)  (H): Data is abnormally high Review of Glycemic Control  Diabetes history: type 2 Outpatient Diabetes medications: Jardiance/Metformin 12.06-998 mg BID, Prandin 2 mg BID Current orders for Inpatient glycemic control: Novolog 0-15 units TID& HS correction scale  Inpatient Diabetes Program Recommendations:   Noted that postprandial CBGs have been greater than 180 mg/dl. Recommend adding Novolog 3 units TID as meal coverage if patient eats 50% of meal and if blood sugars continue to be elevated.  Harvel Ricks RN BSN CDE Diabetes Coordinator Pager: [phone]  8am-5pm

## 2021-07-27 NOTE — TOC Initial Note (Signed)
Transition of Care [facility]) - Initial/Assessment Note    Patient Details  Name: Melanie Lucas MRN: [id number] Date of Birth: 05-26-1950  [facility]) CM/SW Contact:    Geralynn Ochs, LCSW Phone Number: 07/27/2021, 3:18 PM  Clinical Narrative:            CSW spoke with patient's daughter, [name], to discuss recommendation for SNF at discharge. [name] in agreement, preference for [facility], where patient was at recently. CSW sent referral and confirmed acceptance at [facility]. CSW to follow.    Expected Discharge Plan: Skilled Nursing Facility Barriers to Discharge: Continued Medical Work up, Ship broker   Patient Goals and CMS Choice Patient states their goals for this hospitalization and ongoing recovery are:: patient unable to participate in goal setting, not fully oriented CMS Medicare.gov Compare Post Acute Care list provided to:: Patient Represenative (must comment) Choice offered to / list presented to : Adult Children  Expected Discharge Plan and Services Expected Discharge Plan: [place] Acute Care Choice: [place] arrangements for the past 2 months: Single Family Home                                      Prior Living Arrangements/Services Living arrangements for the past 2 months: Single Family Home Lives with:: Self Patient language and need for interpreter reviewed:: No Do you feel safe going back to the place where you live?: Yes      Need for Family Participation in Patient Care: Yes (Comment) Care giver support system in place?: No (comment)   Criminal Activity/Legal Involvement Pertinent to Current Situation/Hospitalization: No - Comment as needed  Activities of Daily Living      Permission Sought/Granted Permission sought to share information with : Facility Sport and exercise psychologist, Family Supports Permission granted to share information with : Yes, Verbal Permission  Granted  Share Information with NAME: Frazier Richards  Permission granted to share info w AGENCY: SNF  Permission granted to share info w Relationship: Daughters     Emotional Assessment   Attitude/Demeanor/Rapport: Unable to Assess Affect (typically observed): Unable to Assess Orientation: : Oriented to Self, Oriented to Place Alcohol / Substance Use: Not Applicable Psych Involvement: No (comment)  Admission diagnosis:  Aphasia [R47.01] Confusion [R41.0] Acute encephalopathy [G93.40] Acute nonintractable headache, unspecified headache type [R51.9] Acute ischemic stroke [facility]) [I63.9] Patient Active Problem List   Diagnosis Date Noted   Acquired thrombophilia ([place]) 07/25/2021   Neck pain 07/25/2021   Acute ischemic stroke ([place]) 07/24/2021   Nicotine dependence, cigarettes, uncomplicated 53/97/6734   Myocardial injury 07/24/2021   Acute on chronic diastolic CHF (congestive heart failure) ([place]) 03/08/2021   Renal lesion 03/08/2021   Sternal fracture 03/07/2021   Hypokalemia 03/07/2021   CVA (cerebral vascular accident) ([place]) 03/07/2021   Chronic anticoagulation 03/07/2021   Left homonymous hemianopsia 03/06/2021   Stroke-like episode 19/37/9024   Acute metabolic encephalopathy 09/73/5329   Depression 12/31/2017   Stroke ([place]) 12/31/2017   COPD (chronic obstructive pulmonary disease) ([place]) 12/30/2017   Left carotid artery stenosis 07/29/2017   TIA (transient ischemic attack) 06/24/2017   Ischemic stroke ([place]) 06/23/2017   Cervical spondylosis with myelopathy 02/21/2016   Cervical stenosis of spine 11/24/2014   Numbness of fingers of both hands 11/24/2014   Mixed diabetic hyperlipidemia associated with type 2 diabetes mellitus ([place]) 11/09/2014   Type 2 diabetes mellitus without  complication, without long-term current use of insulin ([place]) 11/09/2014   Pulmonary hypertension ([place]) 08/07/2013   Coronary artery disease involving native coronary artery of native heart without  angina pectoris 03/11/2013   Peripheral edema 02/25/2012   Essential hypertension, benign 05/06/2009   Overweight 04/13/2009   TOBACCO ABUSE 04/13/2009   Paroxysmal atrial fibrillation ([place]) 04/13/2009   PCP:  Kristen Loader, FNP Pharmacy:   [id number] [place], [place]Phone: [phone]: [address]Phone: [phone]: [phone]    Social Determinants of Health (SDOH) Interventions    Readmission Risk Interventions     No data to display

## 2021-07-27 NOTE — Evaluation (Signed)
Speech Language Pathology Evaluation Patient Details Name: Melanie Lucas MRN: [id number] DOB: September 12, 1950 Today's Date: 07/27/2021 Time: 5053-9767 SLP Time Calculation (min) (ACUTE ONLY): 22 min  Problem List:  Patient Active Problem List   Diagnosis Date Noted   Acquired thrombophilia ([place]) 07/25/2021   Neck pain 07/25/2021   Acute ischemic stroke ([place]) 07/24/2021   Nicotine dependence, cigarettes, uncomplicated 34/19/3790   Myocardial injury 07/24/2021   Acute on chronic diastolic CHF (congestive heart failure) ([place]) 03/08/2021   Renal lesion 03/08/2021   Sternal fracture 03/07/2021   Hypokalemia 03/07/2021   CVA (cerebral vascular accident) ([place]) 03/07/2021   Chronic anticoagulation 03/07/2021   Left homonymous hemianopsia 03/06/2021   Stroke-like episode 24/10/7351   Acute metabolic encephalopathy 29/92/4268   Depression 12/31/2017   Stroke ([place]) 12/31/2017   COPD (chronic obstructive pulmonary disease) ([place]) 12/30/2017   Left carotid artery stenosis 07/29/2017   TIA (transient ischemic attack) 06/24/2017   Ischemic stroke ([place]) 06/23/2017   Cervical spondylosis with myelopathy 02/21/2016   Cervical stenosis of spine 11/24/2014   Numbness of fingers of both hands 11/24/2014   Mixed diabetic hyperlipidemia associated with type 2 diabetes mellitus ([place]) 11/09/2014   Type 2 diabetes mellitus without complication, without long-term current use of insulin ([place]) 11/09/2014   Pulmonary hypertension ([place]) 08/07/2013   Coronary artery disease involving native coronary artery of native heart without angina pectoris 03/11/2013   Peripheral edema 02/25/2012   Essential hypertension, benign 05/06/2009   Overweight 04/13/2009   TOBACCO ABUSE 04/13/2009   Paroxysmal atrial fibrillation ([place]) 04/13/2009   Past Medical History:  Past Medical History:  Diagnosis Date   Anxiety    Cancer ([place])    Skin- leg    Chronic acquired lymphedema    a. R>L;  b. 03/2012 Neg LE U/S for DVT. Legs  since age 63   Complication of anesthesia    woke up during colonoscopy, Lithrostrixpy, Biospy   COPD (chronic obstructive pulmonary disease) ([place])    Coronary artery disease    CVD (cardiovascular disease)    Deaf, right    Diabetes mellitus    Type II   Diabetic neuropathy ([place])    Dysrhythmia    afib   Elevated TSH    a. 10/2012 - inst to f/u PCP.   Full dentures    Hematuria    a. while on pradaxa,  she reports that she has seen Dr Margie Ege and had low risk cystoscopy   History of kidney stones    Hyperlipidemia    Hypertension    patient denies   Incontinence    prior to urinating   Lacunar infarction [facility])    a. 02/2009 non-acute Lacunar infarct of the right thalamus noted on MRI of brain.   Left carotid stenosis    Lymphedema of extremity    had this problem, since she was a teenager. especially seen in R LE   Meniere disease    Myocardial infarction [facility])    Obesity    Osteoarthritis    cervical & lumbar region, knees, hands cramp also    PAF with post-termination pauses    a. on dronedarone;  b. CHA2DS2VASc = 5 (HTN, DM, h/o lacunar infarct on MRI, Female) ->refused oral anticoagulation after h/o hematuria on pradaxa;  c. 02/2012 Echo: EF 55-60%, mildly dil LA. D. Recurrent PAF 10/2012 after only taking Multaq 1x/day - spont conv to NSR, placed back on BID Multaq/eliquis;  e. 10/2014 Multaq d/c'd->tikosyn initiated.   Shortness of breath dyspnea  with exertion   Stroke [facility])    2 mini strokes   Tobacco abuse    Vision abnormalities    Past Surgical History:  Past Surgical History:  Procedure Laterality Date   ANTERIOR CERVICAL CORPECTOMY N/A 02/21/2016   Procedure: ANTERIOR CERVICAL CORPECTOMY ANS FUSION CERVICAL SIX , ANTERIOR PLATING CERVICAL FIVE-SEVEN;  Surgeon: Consuella Lose, MD;  Location: [facility];  Service: Neurosurgery;  Laterality: N/A;   ANTERIOR FUSION CERVICAL SPINE  02/21/2016   Biopsy of adrenal glands     BREAST BIOPSY Bilateral    benign results,  both breasts    CARDIAC CATHETERIZATION  2014   x1 stent placed, done in [place]    CARDIOVERSION N/A 11/11/2014   Procedure: CARDIOVERSION;  Surgeon: Larey Dresser, MD;  Location: [facility];  Service: Cardiovascular;  Laterality: N/A;   CAROTID ENDARTERECTOMY Left 07/29/2017   COLONOSCOPY W/ POLYPECTOMY     CORONARY STENT PLACEMENT     ENDARTERECTOMY Left 07/29/2017   Procedure: ENDARTERECTOMY CAROTID LEFT;  Surgeon: Rosetta Posner, MD;  Location: [place];  Service: Vascular;  Laterality: Left;   PATCH ANGIOPLASTY Left 07/29/2017   Procedure: PATCH ANGIOPLASTY USING HEMASHIELD GOLD VASCULAR PATCH;  Surgeon: Rosetta Posner, MD;  Location: [place];  Service: Vascular;  Laterality: Left;   TEE WITHOUT CARDIOVERSION N/A 11/11/2014   Procedure: TRANSESOPHAGEAL ECHOCARDIOGRAM (TEE);  Surgeon: Larey Dresser, MD;  Location: [facility] ENDOSCOPY;  Service: Cardiovascular;  Laterality: N/A;   HPI:  Pt is a 71 yo female presenting with posterior head and neck pain after recent fall. Pt also developed intermittent confusion and word finding difficulties. MRI showed acute to early subacute infarcts within the L basal ganglia and L corona radiata. PMH includes: R ear deafness, strokes (with residual dysarthria, memory issues, balance issues; SLUMS in January 2023 with score 10/30 with mild dysarthria reported), ACDF C5-7, COPD, meniere disease, tobacco use, DMII, HTN, HLD, PAF   Assessment / Plan / Recommendation Clinical Impression  Pt presents with cognitive-linguistic impairments, sounding like they are acute on chronic per review of chart. She is very distractible, sometimes with word finding errors but sometimes seemingly losing her attention mid-sentence. She needed frequent redirection even after distractions were reduced in her environment. Heavy cueing was provided to try simple verbal problem solving within a functional context and when discussing potential safety scenarios. Pt demonstrates limited  awareness of deficits, disorientation to situation, and decreased safety awareness. She would benefit from ongoing SLP f/u acutely and at SNF.    SLP Assessment  SLP Recommendation/Assessment: Patient needs continued Speech [place] Pathology Services SLP Visit Diagnosis: Cognitive communication deficit (R41.841)    Recommendations for follow up therapy are one component of a multi-disciplinary discharge planning process, led by the attending physician.  Recommendations may be updated based on patient status, additional functional criteria and insurance authorization.    Follow Up Recommendations  Skilled nursing-short term rehab (<3 hours/day)    Assistance Recommended at Discharge  Frequent or constant Supervision/Assistance  Functional Status Assessment Patient has had a recent decline in their functional status and demonstrates the ability to make significant improvements in function in a reasonable and predictable amount of time.  Frequency and Duration min 2x/week  2 weeks      SLP Evaluation Cognition  Overall Cognitive Status: No family/caregiver present to determine baseline cognitive functioning Arousal/Alertness: Awake/alert Orientation Level: Oriented to person;Oriented to place;Disoriented to time;Disoriented to situation Attention: Sustained Sustained Attention: Impaired Sustained Attention Impairment: Verbal basic Memory: Impaired  Memory Impairment: Decreased recall of new information;Retrieval deficit Awareness: Impaired Awareness Impairment: Intellectual impairment Problem Solving: Impaired Problem Solving Impairment: Verbal basic Safety/Judgment: Impaired       Comprehension  Auditory Comprehension Overall Auditory Comprehension: Impaired Conversation: Simple    Expression Expression Primary Mode of Expression: Verbal Verbal Expression Overall Verbal Expression: Impaired Initiation: No impairment Level of Generative/Spontaneous Verbalization:  Phrase Naming:  (word finding errors, losing train of thought mid-sentence) Non-Verbal Means of Communication: Not applicable   Oral / Motor  Motor Speech Overall Motor Speech: Impaired Respiration: Within functional limits Phonation: Normal Resonance: Within functional limits Articulation: Impaired Level of Impairment: Phrase Intelligibility: Intelligibility reduced Word: 75-100% accurate Phrase: 75-100% accurate            Osie Bond., M.A. [place] Office [phone]  Secure chat preferred  07/27/2021, 4:07 PM

## 2021-07-27 NOTE — Plan of Care (Signed)
  Problem: Education: Goal: Ability to describe self-care measures that may prevent or decrease complications (Diabetes Survival Skills Education) will improve Outcome: Progressing Goal: Individualized Educational Video(s) Outcome: Progressing   

## 2021-07-27 NOTE — Progress Notes (Signed)
At approximately 1945 pt attempted to remove foley catheter by pulling it out, pt was reoriented and asked not to pull on foley.  There seems to be some forgetfulness, mitts were placed for safety. [facility] informed.

## 2021-07-27 NOTE — Progress Notes (Signed)
   07/27/21 0000  Vitals  ECG Heart Rate (!) 115  MEWS COLOR  MEWS Score Color Yellow  MEWS Score  MEWS Temp 0  MEWS Systolic 0  MEWS Pulse 2  MEWS RR 0  MEWS LOC 0  MEWS Score 2   [facility] messaged regarding sustained HR< and soft SBP. See new orders.

## 2021-07-28 DIAGNOSIS — I639 Cerebral infarction, unspecified: Secondary | ICD-10-CM | POA: Diagnosis not present

## 2021-07-28 LAB — GLUCOSE, CAPILLARY
Glucose-Capillary: 160 mg/dL — ABNORMAL HIGH (ref 70–99)
Glucose-Capillary: 168 mg/dL — ABNORMAL HIGH (ref 70–99)
Glucose-Capillary: 216 mg/dL — ABNORMAL HIGH (ref 70–99)
Glucose-Capillary: 211 mg/dL — ABNORMAL HIGH (ref 70–99)

## 2021-07-28 MED ORDER — CHLORHEXIDINE GLUCONATE CLOTH 2 % EX PADS
6.0000 | MEDICATED_PAD | Freq: Every day | CUTANEOUS | Status: DC
  Administered 2021-07-28: 6 via TOPICAL

## 2021-07-28 MED ORDER — METOPROLOL TARTRATE 25 MG PO TABS
25.0000 mg | ORAL_TABLET | Freq: Once | ORAL | Status: AC
  Administered 2021-07-28: 25 mg via ORAL
  Filled 2021-07-28: qty 1

## 2021-07-28 MED ORDER — METOPROLOL TARTRATE 25 MG PO TABS
25.0000 mg | ORAL_TABLET | Freq: Two times a day (BID) | ORAL | Status: DC
  Administered 2021-07-28: 25 mg via ORAL
  Filled 2021-07-28: qty 1

## 2021-07-28 MED ORDER — METOPROLOL TARTRATE 50 MG PO TABS
50.0000 mg | ORAL_TABLET | Freq: Two times a day (BID) | ORAL | Status: DC
  Administered 2021-07-28: 50 mg via ORAL
  Filled 2021-07-28: qty 1

## 2021-07-28 NOTE — Plan of Care (Signed)
  Problem: Coping: Goal: Ability to adjust to condition or change in health will improve Outcome: Progressing   Problem: Fluid Volume: Goal: Ability to maintain a balanced intake and output will improve Outcome: Progressing   Problem: Nutritional: Goal: Maintenance of adequate nutrition will improve Outcome: Progressing   Problem: Skin Integrity: Goal: Risk for impaired skin integrity will decrease Outcome: Progressing   

## 2021-07-28 NOTE — Progress Notes (Signed)
Progress Note    Melanie Lucas   QVZ:[id number]  DOB: September 30, 1950  DOA: 07/24/2021     3 PCP: Kristen Loader, FNP  Initial CC: Confusion  Hospital Course: Melanie Lucas is a 71 y.o. female with pAF on Eliquis and dofetilide, recent stroke Jan 2023, CAD s/p PCI remote, DM, PVD carotid, HTN, dCHF and smoking who presented with headache, neck pain and confusion.  MRI brain showed acute/early subacute infarct involving left basal ganglia and left corona radiata.  Unchanged C5-7 ACDF with moderate right neural foraminal stenosis and mild spinal canal stenosis at C6-7 level.  Large area of myelomalacia within the cervical spinal cord at C5-6 levels, unchanged. Neurology was also consulted on admission.  Interval History:  No events overnight.  Becomes intermittently agitated trying to pull out mittens and pull out Foley catheter.  Assessment and Plan: * Acute ischemic stroke ([place]) - MRI shows acute and subacute infarcts in L BG and corona radiata.  Non-invasive angiography shows no severe intracranial stenoses -Continue Eliquis -Continue Zetia (statin intolerant) - Evaluated by neurology, appreciate assistance -Evaluated by PT, plan will be for SNF at discharge  Paroxysmal atrial fibrillation (HCC) - Developed RVR after admission and was started on Cardizem drip - Continue Tikosyn - Continue Eliquis -Suspect some tachycardia is induced by underlying anxiety/agitation especially from being in restraints.  Resume on low-dose benzo for now for further agitation - continue lopressor; will adjust as necessary  Acute on chronic diastolic CHF (congestive heart failure) (HCC) EF 55-60% in Jan 2023, grade 1 DD and normal valves at that time. -Patient has chronic lower extremity lymphedema at baseline  Acute metabolic encephalopathy - some probable underlying degree of cognitive impairment per discussion with daughter; patient did become agitated with last CVA, attempting to get OOB and pull at IV  lines, etc.  - suspect she will have a component of delirium given underlying cognitive impairment -Continue low-dose Ativan for agitation - Continue sitter if needed; continue mittens  Neck pain Patient concerned about "cracking" in her neck after a recent fall.  MRI C-spine unremarkable.  Acquired thrombophilia ([place]) - Continue Eliquis  Myocardial injury Troponin leak due to rapid afib.  Myocardial infarction ruled out, no further ischemic work up at this time.  Nicotine dependence, cigarettes, uncomplicated - Nicotine patch  COPD (chronic obstructive pulmonary disease) (HCC) No active flare, not on inhlaers at home  Type 2 diabetes mellitus without complication, without long-term current use of insulin (HCC) A1c 7.3% - Continue SSI - Hold Prandin, jardiance, metformin  Mixed diabetic hyperlipidemia associated with type 2 diabetes mellitus (HCC) LDL 91 -Statin intolerant.  Continue Zetia  Pulmonary hypertension ([place]) See above  Coronary artery disease involving native coronary artery of native heart without angina pectoris Statin intolerant -Continue Eliquis, Zetia -Resume losartan  Essential hypertension, benign - Continue losartan   Old records reviewed in assessment of this patient  Antimicrobials:   DVT prophylaxis:  SCDs Start: 07/24/21 2309 apixaban (ELIQUIS) tablet 5 mg   Code Status:   Code Status: Full Code  Disposition Plan: SNF Status is: Inpatient  Objective: Blood pressure 122/75, pulse (!) 104, temperature 98.7 F (37.1 C), temperature source Oral, resp. rate 18, height '5\' 7"'$  (1.702 m), weight 66.2 kg, SpO2 97 %.  Examination:  Physical Exam Constitutional:      General: She is not in acute distress. HENT:     Head: Normocephalic and atraumatic.     Mouth/Throat:     Mouth: Mucous membranes are moist.  Eyes:     Extraocular Movements: Extraocular movements intact.  Cardiovascular:     Rate and Rhythm: Normal rate and regular rhythm.   Pulmonary:     Effort: Pulmonary effort is normal.     Breath sounds: Normal breath sounds.  Abdominal:     General: Bowel sounds are normal. There is no distension.     Palpations: Abdomen is soft.     Tenderness: There is no abdominal tenderness.  Musculoskeletal:        General: Normal range of motion.     Cervical back: Normal range of motion and neck supple.  Skin:    General: Skin is warm and dry.  Neurological:     Comments: Tracking better with eyes and follows commands and moves all 4 extremities      Consultants:  Neurology  Procedures:    Data Reviewed: Results for orders placed or performed during the hospital encounter of 07/24/21 (from the past 24 hour(s))  Glucose, capillary     Status: Abnormal   Collection Time: 07/27/21  4:57 PM  Result Value Ref Range   Glucose-Capillary 163 (H) 70 - 99 mg/dL  Glucose, capillary     Status: Abnormal   Collection Time: 07/27/21  8:29 PM  Result Value Ref Range   Glucose-Capillary 259 (H) 70 - 99 mg/dL  Glucose, capillary     Status: Abnormal   Collection Time: 07/28/21  5:06 AM  Result Value Ref Range   Glucose-Capillary 160 (H) 70 - 99 mg/dL  Glucose, capillary     Status: Abnormal   Collection Time: 07/28/21 11:02 AM  Result Value Ref Range   Glucose-Capillary 168 (H) 70 - 99 mg/dL    I have Reviewed nursing notes, Vitals, and Lab results since pt's last encounter. Pertinent lab results : see above I have ordered test including BMP, CBC, Mg I have reviewed the last note from staff over past 24 hours I have discussed pt's care plan and test results with nursing staff, case manager   LOS: 3 days   Dwyane Dee, MD [facility] 07/28/2021, 1:29 PM

## 2021-07-28 NOTE — Progress Notes (Signed)
PT Cancellation Note  Patient Details Name: Melanie Lucas MRN: [id number] DOB: Mar 05, 1950   Cancelled Treatment:    Reason Eval/Treat Not Completed: Patient at procedure or test/unavailable.  Pt just fell to sleep.  Staff/sitter asking to let pt sleep. 07/28/2021  Ginger Carne., PT Acute Rehabilitation Services [phone]  (pager) [phone]  (office)   [name] 07/28/2021, 5:05 PM

## 2021-07-28 NOTE — Care Management Important Message (Signed)
Important Message  Patient Details  Name: Melanie Lucas MRN: [id number] Date of Birth: 12-22-1950   Medicare Important Message Given:  Yes     Lexany Belknap 07/28/2021, 2:00 PM

## 2021-07-29 DIAGNOSIS — I639 Cerebral infarction, unspecified: Secondary | ICD-10-CM | POA: Diagnosis not present

## 2021-07-29 DIAGNOSIS — I480 Paroxysmal atrial fibrillation: Secondary | ICD-10-CM | POA: Diagnosis not present

## 2021-07-29 LAB — GLUCOSE, CAPILLARY
Glucose-Capillary: 136 mg/dL — ABNORMAL HIGH (ref 70–99)
Glucose-Capillary: 157 mg/dL — ABNORMAL HIGH (ref 70–99)
Glucose-Capillary: 182 mg/dL — ABNORMAL HIGH (ref 70–99)
Glucose-Capillary: 289 mg/dL — ABNORMAL HIGH (ref 70–99)

## 2021-07-29 MED ORDER — DILTIAZEM HCL 25 MG/5ML IV SOLN
15.0000 mg | Freq: Once | INTRAVENOUS | Status: AC
  Administered 2021-07-29: 15 mg via INTRAVENOUS
  Filled 2021-07-29: qty 5

## 2021-07-29 MED ORDER — METOPROLOL TARTRATE 50 MG PO TABS
75.0000 mg | ORAL_TABLET | Freq: Two times a day (BID) | ORAL | Status: DC
  Administered 2021-07-29 (×2): 75 mg via ORAL
  Filled 2021-07-29 (×2): qty 1

## 2021-07-29 MED ORDER — MELATONIN 3 MG PO TABS
3.0000 mg | ORAL_TABLET | Freq: Every evening | ORAL | Status: DC | PRN
Start: 2021-07-29 — End: 2021-08-02
  Administered 2021-07-29 – 2021-07-31 (×3): 3 mg via ORAL
  Filled 2021-07-29 (×3): qty 1

## 2021-07-29 NOTE — Progress Notes (Signed)
Patient has not voided since accidental dislodgement of foley catheter early this morning. I got Melanie Lucas up to the bedside commode to facilitate with passing urine. Melanie Lucas expressed that she did not feel the need to pee. Myrbetriq this morning was held. I did a bladder scan that revealed 250m.  Straight cath per foley removal protocol = 4223mdark urine.

## 2021-07-29 NOTE — Progress Notes (Signed)
   07/29/21 0821  Assess: MEWS Score  Temp 99.1 F (37.3 C)  BP (!) 115/95  MAP (mmHg) 103  Pulse Rate (!) 121  Resp 20  SpO2 99 %  O2 Device Room Air  Assess: MEWS Score  MEWS Temp 0  MEWS Systolic 0  MEWS Pulse 2  MEWS RR 0  MEWS LOC 0  MEWS Score 2  MEWS Score Color Yellow  Assess: if the MEWS score is Yellow or Red  Were vital signs taken at a resting state? Yes  Focused Assessment No change from prior assessment  Does the patient meet 2 or more of the SIRS criteria? No  MEWS guidelines implemented *See Row Information* Yes  Treat  Pain Score Asleep  Take Vital Signs  Increase Vital Sign Frequency  Yellow: Q 2hr X 2 then Q 4hr X 2, if remains yellow, continue Q 4hrs  Escalate  MEWS: Escalate Yellow: discuss with charge nurse/RN and consider discussing with provider and RRT  Notify: Provider  Provider Name/Title Girguis MD  Date Provider Notified 07/29/21  Time Provider Notified 1000  Method of Notification Page  Notification Reason Other (Comment) (YELLOW MEWS)  Provider response See new orders  Date of Provider Response 07/29/21  Time of Provider Response [phone]  Document  Patient Outcome Stabilized after interventions  Assess: SIRS CRITERIA  SIRS Temperature  0  SIRS Pulse 1  SIRS Respirations  0  SIRS WBC 0  SIRS Score Sum  1

## 2021-07-29 NOTE — Progress Notes (Signed)
CCMD advised me that the patient has converted into NSR at 1627 with a rate of 60.  I reviewed the strip, patient does have p waves, rate is irregular rhythm. My interpretation is wandering atrial pacemaker.  Strip saved and able to be reviewed on the tele monitors.

## 2021-07-29 NOTE — Progress Notes (Signed)
Change in HR rhythm and rate reported to [facility] on call Physician.    Patient is in a SB w HB, 12 lead taken and discussed with him.

## 2021-07-29 NOTE — Progress Notes (Signed)
Progress Note    TECLA MAILLOUX   MPN:[id number]  DOB: May 30, 1950  DOA: 07/24/2021     4 PCP: Kristen Loader, FNP  Initial CC: Confusion  Hospital Course: Melanie Lucas is a 71 y.o. female with pAF on Eliquis and dofetilide, recent stroke Jan 2023, CAD s/p PCI remote, DM, PVD carotid, HTN, dCHF and smoking who presented with headache, neck pain and confusion.  MRI brain showed acute/early subacute infarct involving left basal ganglia and left corona radiata.  Unchanged C5-7 ACDF with moderate right neural foraminal stenosis and mild spinal canal stenosis at C6-7 level.  Large area of myelomalacia within the cervical spinal cord at C5-6 levels, unchanged. Neurology was also consulted on admission.  Interval History:  No events overnight.  Heart rate still uncontrolled this morning but responded well to Cardizem bolus.  She was cooperative and awake/alert resting in bed in no distress.  No further sitter noted bedside and no restraints in place.  Assessment and Plan: * Acute ischemic stroke ([place]) - MRI shows acute and subacute infarcts in L BG and corona radiata.  Non-invasive angiography shows no severe intracranial stenoses -Continue Eliquis -Continue Zetia (statin intolerant) - Evaluated by neurology, appreciate assistance -Evaluated by PT, plan will be for SNF at discharge  Paroxysmal atrial fibrillation (HCC) - Developed RVR after admission and was started on Cardizem drip - Continue Tikosyn - Continue Eliquis -Suspect some tachycardia is induced by underlying anxiety/agitation especially from being in restraints.  Resume on low-dose benzo for now for further agitation - continue lopressor; will adjust as necessary  Acute on chronic diastolic CHF (congestive heart failure) (HCC) EF 55-60% in Jan 2023, grade 1 DD and normal valves at that time. -Patient has chronic lower extremity lymphedema at baseline  Acute metabolic encephalopathy - some probable underlying degree of cognitive  impairment per discussion with daughter; patient did become agitated with last CVA, attempting to get OOB and pull at IV lines, etc.  - suspect she will have a component of delirium given underlying cognitive impairment -Continue low-dose Ativan for agitation - no longer requiring sitter or restraints   Neck pain Patient concerned about "cracking" in her neck after a recent fall.  MRI C-spine unremarkable.  Acquired thrombophilia ([place]) - Continue Eliquis  Myocardial injury Troponin leak due to rapid afib.  Myocardial infarction ruled out, no further ischemic work up at this time.  Nicotine dependence, cigarettes, uncomplicated - Nicotine patch  COPD (chronic obstructive pulmonary disease) (HCC) No active flare, not on inhlaers at home  Type 2 diabetes mellitus without complication, without long-term current use of insulin (HCC) A1c 7.3% - Continue SSI - Hold Prandin, jardiance, metformin  Mixed diabetic hyperlipidemia associated with type 2 diabetes mellitus (HCC) LDL 91 -Statin intolerant.  Continue Zetia  Pulmonary hypertension ([place]) See above  Coronary artery disease involving native coronary artery of native heart without angina pectoris Statin intolerant -Continue Eliquis, Zetia -Resume losartan  Essential hypertension, benign - Continue losartan   Old records reviewed in assessment of this patient  Antimicrobials:   DVT prophylaxis:  SCDs Start: 07/24/21 2309 apixaban (ELIQUIS) tablet 5 mg   Code Status:   Code Status: Full Code  Disposition Plan: SNF Status is: Inpatient  Objective: Blood pressure 122/86, pulse 71, temperature 98.2 F (36.8 C), temperature source Oral, resp. rate 20, height '5\' 7"'$  (1.702 m), weight 66.2 kg, SpO2 98 %.  Examination:  Physical Exam Constitutional:      General: She is not in acute distress. HENT:  Head: Normocephalic and atraumatic.     Mouth/Throat:     Mouth: Mucous membranes are moist.  Eyes:      Extraocular Movements: Extraocular movements intact.  Cardiovascular:     Rate and Rhythm: Normal rate. Rhythm irregular.  Pulmonary:     Effort: Pulmonary effort is normal.     Breath sounds: Normal breath sounds.  Abdominal:     General: Bowel sounds are normal. There is no distension.     Palpations: Abdomen is soft.     Tenderness: There is no abdominal tenderness.  Musculoskeletal:        General: Normal range of motion.     Cervical back: Normal range of motion and neck supple.  Skin:    General: Skin is warm and dry.  Neurological:     Comments: Tracking better with eyes and follows commands and moves all 4 extremities      Consultants:  Neurology  Procedures:    Data Reviewed: Results for orders placed or performed during the hospital encounter of 07/24/21 (from the past 24 hour(s))  Glucose, capillary     Status: Abnormal   Collection Time: 07/28/21  4:31 PM  Result Value Ref Range   Glucose-Capillary 216 (H) 70 - 99 mg/dL  Glucose, capillary     Status: Abnormal   Collection Time: 07/28/21 10:08 PM  Result Value Ref Range   Glucose-Capillary 211 (H) 70 - 99 mg/dL  Glucose, capillary     Status: Abnormal   Collection Time: 07/29/21  6:25 AM  Result Value Ref Range   Glucose-Capillary 136 (H) 70 - 99 mg/dL  Glucose, capillary     Status: Abnormal   Collection Time: 07/29/21 11:25 AM  Result Value Ref Range   Glucose-Capillary 157 (H) 70 - 99 mg/dL    I have Reviewed nursing notes, Vitals, and Lab results since pt's last encounter. Pertinent lab results : see above I have ordered test including BMP, CBC, Mg I have reviewed the last note from staff over past 24 hours I have discussed pt's care plan and test results with nursing staff, case manager   LOS: 4 days   Dwyane Dee, MD [facility] 07/29/2021, 3:37 PM

## 2021-07-29 NOTE — Progress Notes (Addendum)
[facility] night coverage note:  Called by RN: after getting tonights tykosin and lopressor, patient converted out of A.Fib but into S.brady with HR as low as mid 30s.  Pt seen at bedside, denies CP or pain anywhere, mental status is at her baseline per RN.  BP 123/74 despite HR 37.  EKG shows S.Brady with PACs. Also has RBBB and LAFB though these were also present on prior EKG.  1) [place] tykosin and lopressor from chart for the moment. 2) dont think we need to give atropine or glucagon nor start perc pacing at the moment given no change in mental status, BP okay.  Will curbside cards on-call to make sure.  Said nothing to do other than check BMP and Mg.

## 2021-07-29 NOTE — Progress Notes (Signed)
Physical Therapy Treatment Patient Details Name: Melanie Lucas MRN: [id number] DOB: 1950-10-10 Today's Date: 07/29/2021   History of Present Illness Pt is a 71 y/o F presenting to [facility] ED with posterior head and neck pain, confusion, and wordfinding difficulties. Transferred to [facility] for further workup. EEG showing corical dysfunction of L hemisphere, MRI revealing acute/subacute infarcts in L basal ganglia and corona radiata. PMH includes A fib on Eliquis, R occipital CVA (02/2021), R ear deafness, HTN, DM2, CAD, and HLD.    PT Comments    Patient did not do as well today.  She was very lethargic, not really vocally responding to questions.  Only able to ambulate about 20 feet with extremely slow pace.  Friends present during session reporting she "just needs to get out of here".  Agree at current level patient will need SNF for further therapy.    Recommendations for follow up therapy are one component of a multi-disciplinary discharge planning process, led by the attending physician.  Recommendations may be updated based on patient status, additional functional criteria and insurance authorization.  Follow Up Recommendations  Skilled nursing-short term rehab (<3 hours/day)     Assistance Recommended at Discharge Frequent or constant Supervision/Assistance  Patient can return home with the following A lot of help with walking and/or transfers;A lot of help with bathing/dressing/bathroom;Assistance with cooking/housework;Assistance with feeding;Direct supervision/assist for medications management;Direct supervision/assist for financial management;Help with stairs or ramp for entrance   Equipment Recommendations  Rolling walker (2 wheels)    Recommendations for Other Services       Precautions / Restrictions Precautions Precautions: Fall     Mobility  Bed Mobility Overal bed mobility: Needs Assistance Bed Mobility: Supine to Sit, Sit to Supine     Supine to sit: Min  assist Sit to supine: Min assist   General bed mobility comments: assist to lift shoulders off bed and to lower shoulders when returning; assist for LE    Transfers Overall transfer level: Needs assistance Equipment used: Rolling walker (2 wheels) Transfers: Sit to/from Stand Sit to Stand: Min assist           General transfer comment: cueing for sequence    Ambulation/Gait Ambulation/Gait assistance: Min assist Gait Distance (Feet): 20 Feet Assistive device: Rolling walker (2 wheels) Gait Pattern/deviations: Step-to pattern, Decreased stride length, Narrow base of support, Trunk flexed Gait velocity: severly decreased   Pre-gait activities: attempted upright standing using walker.  Patient unable to reach full upright and reliant on RW for balance General Gait Details: required max cueing for sequencing and to take larger steps (barely stepping without cues).   Stairs             Wheelchair Mobility    Modified Rankin (Stroke Patients Only)       Balance Overall balance assessment: Needs assistance Sitting-balance support: Bilateral upper extremity supported, Feet supported Sitting balance-Leahy Scale: Fair     Standing balance support: During functional activity, Reliant on assistive device for balance, Bilateral upper extremity supported Standing balance-Leahy Scale: Poor                              Cognition Arousal/Alertness: Lethargic Behavior During Therapy: Flat affect Overall Cognitive Status: Difficult to assess (due to lethargy, limited responsivenss to questions)  Exercises      General Comments        Pertinent Vitals/Pain Pain Assessment Pain Assessment: No/denies pain    Home Living                          Prior Function            PT Goals (current goals can now be found in the care plan section) Progress towards PT goals: Progressing toward  goals    Frequency    Min 3X/week      PT Plan Current plan remains appropriate    Co-evaluation              AM-PAC PT "6 Clicks" Mobility   Outcome Measure  Help needed turning from your back to your side while in a flat bed without using bedrails?: A Little Help needed moving from lying on your back to sitting on the side of a flat bed without using bedrails?: A Little Help needed moving to and from a bed to a chair (including a wheelchair)?: A Little Help needed standing up from a chair using your arms (e.g., wheelchair or bedside chair)?: A Little Help needed to walk in hospital room?: A Lot Help needed climbing 3-5 steps with a railing? : A Lot 6 Click Score: 16    End of Session Equipment Utilized During Treatment: Gait belt Activity Tolerance: Patient limited by lethargy Patient left: in bed;with call bell/phone within reach;with bed alarm set;with family/visitor present   PT Visit Diagnosis: Unsteadiness on feet (R26.81);Other abnormalities of gait and mobility (R26.89);Other symptoms and signs involving the nervous system (R29.898)     Time: 8756-4332 PT Time Calculation (min) (ACUTE ONLY): 25 min  Charges:  $Therapeutic Activity: 23-37 mins                     07/29/2021 Margie, PT Acute Rehabilitation Services Office:  [phone]    Shanna Cisco 07/29/2021, 3:48 PM

## 2021-07-29 NOTE — Progress Notes (Signed)
Provider at bedside

## 2021-07-30 DIAGNOSIS — I639 Cerebral infarction, unspecified: Secondary | ICD-10-CM | POA: Diagnosis not present

## 2021-07-30 DIAGNOSIS — I480 Paroxysmal atrial fibrillation: Secondary | ICD-10-CM | POA: Diagnosis not present

## 2021-07-30 LAB — MAGNESIUM
Magnesium: 2.1 mg/dL (ref 1.7–2.4)
Magnesium: 1.9 mg/dL (ref 1.7–2.4)
Magnesium: 2.2 mg/dL (ref 1.7–2.4)

## 2021-07-30 LAB — BASIC METABOLIC PANEL
Anion gap: 9 (ref 5–15)
Anion gap: 9 (ref 5–15)
Anion gap: 10 (ref 5–15)
BUN: 32 mg/dL — ABNORMAL HIGH (ref 8–23)
BUN: 31 mg/dL — ABNORMAL HIGH (ref 8–23)
BUN: 31 mg/dL — ABNORMAL HIGH (ref 8–23)
CO2: 24 mmol/L (ref 22–32)
CO2: 24 mmol/L (ref 22–32)
CO2: 23 mmol/L (ref 22–32)
Calcium: 9.1 mg/dL (ref 8.9–10.3)
Calcium: 8.7 mg/dL — ABNORMAL LOW (ref 8.9–10.3)
Calcium: 8.4 mg/dL — ABNORMAL LOW (ref 8.9–10.3)
Chloride: 106 mmol/L (ref 98–111)
Chloride: 105 mmol/L (ref 98–111)
Chloride: 106 mmol/L (ref 98–111)
Creatinine, Ser: 0.6 mg/dL (ref 0.44–1.00)
Creatinine, Ser: 0.74 mg/dL (ref 0.44–1.00)
Creatinine, Ser: 1.04 mg/dL — ABNORMAL HIGH (ref 0.44–1.00)
GFR, Estimated: >60 mL/min (ref >60)
GFR, Estimated: >60 mL/min (ref >60)
GFR, Estimated: 57 mL/min — ABNORMAL LOW (ref >60)
Glucose, Bld: 197 mg/dL — ABNORMAL HIGH (ref 70–99)
Glucose, Bld: 213 mg/dL — ABNORMAL HIGH (ref 70–99)
Glucose, Bld: 189 mg/dL — ABNORMAL HIGH (ref 70–99)
Potassium: 3.3 mmol/L — ABNORMAL LOW (ref 3.5–5.1)
Potassium: 3.5 mmol/L (ref 3.5–5.1)
Potassium: 4.4 mmol/L (ref 3.5–5.1)
Sodium: 139 mmol/L (ref 135–145)
Sodium: 138 mmol/L (ref 135–145)
Sodium: 139 mmol/L (ref 135–145)

## 2021-07-30 LAB — GLUCOSE, CAPILLARY
Glucose-Capillary: 101 mg/dL — ABNORMAL HIGH (ref 70–99)
Glucose-Capillary: 204 mg/dL — ABNORMAL HIGH (ref 70–99)
Glucose-Capillary: 182 mg/dL — ABNORMAL HIGH (ref 70–99)
Glucose-Capillary: 159 mg/dL — ABNORMAL HIGH (ref 70–99)
Glucose-Capillary: 270 mg/dL — ABNORMAL HIGH (ref 70–99)

## 2021-07-30 MED ORDER — DOFETILIDE 500 MCG PO CAPS: 500.0000 ug | ORAL_CAPSULE | Freq: Two times a day (BID) | ORAL | Status: DC

## 2021-07-30 MED ORDER — POTASSIUM CHLORIDE CRYS ER 20 MEQ PO TBCR
20.0000 meq | EXTENDED_RELEASE_TABLET | Freq: Once | ORAL | Status: AC
Start: 2021-07-30 — End: 2021-07-30
  Administered 2021-07-30: 20 meq via ORAL
  Filled 2021-07-30: qty 1

## 2021-07-30 MED ORDER — POTASSIUM CHLORIDE 20 MEQ PO PACK: 40.0000 meq | PACK | Freq: Once | ORAL | Status: DC

## 2021-07-30 MED ORDER — DOFETILIDE 250 MCG PO CAPS
250.0000 ug | ORAL_CAPSULE | Freq: Two times a day (BID) | ORAL | Status: DC
  Administered 2021-07-30 – 2021-08-01 (×4): 250 ug via ORAL
  Filled 2021-07-30 (×4): qty 1

## 2021-07-30 MED ORDER — POTASSIUM CHLORIDE CRYS ER 20 MEQ PO TBCR
40.0000 meq | EXTENDED_RELEASE_TABLET | Freq: Once | ORAL | Status: AC
Start: 2021-07-30 — End: 2021-07-30
  Administered 2021-07-30: 40 meq via ORAL
  Filled 2021-07-30: qty 2

## 2021-07-30 MED ORDER — POTASSIUM CHLORIDE CRYS ER 20 MEQ PO TBCR
40.0000 meq | EXTENDED_RELEASE_TABLET | Freq: Once | ORAL | Status: AC
  Administered 2021-07-30: 40 meq via ORAL
  Filled 2021-07-30: qty 2

## 2021-07-30 MED ORDER — MAGNESIUM SULFATE 2 GM/50ML IV SOLN
2.0000 g | Freq: Once | INTRAVENOUS | Status: AC
  Administered 2021-07-30: 2 g via INTRAVENOUS
  Filled 2021-07-30: qty 50

## 2021-07-30 MED ORDER — POTASSIUM CHLORIDE CRYS ER 20 MEQ PO TBCR
60.0000 meq | EXTENDED_RELEASE_TABLET | Freq: Once | ORAL | Status: DC
Start: 2021-07-30 — End: 2021-07-30

## 2021-07-30 MED ORDER — DOFETILIDE 500 MCG PO CAPS
500.0000 ug | ORAL_CAPSULE | Freq: Two times a day (BID) | ORAL | Status: DC
  Administered 2021-07-30: 500 ug via ORAL
  Filled 2021-07-30 (×2): qty 1

## 2021-07-30 NOTE — Progress Notes (Signed)
Progress Note    Melanie Lucas   HQP:[id number]  DOB: 1950/10/08  DOA: 07/24/2021     5 PCP: Kristen Loader, FNP  Initial CC: Confusion  Hospital Course: Melanie Lucas is a 71 y.o. female with pAF on Eliquis and dofetilide, recent stroke Jan 2023, CAD s/p PCI remote, DM, PVD carotid, HTN, dCHF and smoking who presented with headache, neck pain and confusion.  MRI brain showed acute/early subacute infarct involving left basal ganglia and left corona radiata.  Unchanged C5-7 ACDF with moderate right neural foraminal stenosis and mild spinal canal stenosis at C6-7 level.  Large area of myelomalacia within the cervical spinal cord at C5-6 levels, unchanged. Neurology was also consulted on admission.  Interval History:  Converted to sinus bradycardia with PACs overnight.  Lopressor and Tikosyn were discontinued.  Patient was asymptomatic.  Cardiology consulted today given missed doses of Tikosyn recently.  Melanie Lucas is to be transferred for Tikosyn reloading.  Assessment and Plan: * Acute ischemic stroke ([place]) - MRI shows acute and subacute infarcts in L BG and corona radiata.  Non-invasive angiography shows no severe intracranial stenoses -Continue Eliquis -Continue Zetia (statin intolerant) - Evaluated by neurology, appreciate assistance -Evaluated by PT, plan will be for SNF at discharge  Paroxysmal atrial fibrillation (HCC) - Developed RVR after admission and was started on Cardizem drip - Continue Tikosyn - Continue Eliquis -Suspect some tachycardia is induced by underlying anxiety/agitation especially from being in restraints.  Resume on low-dose benzo for now for further agitation - has missed intermittent doses of tikosyn; discussed with cardiology and patient to be transferred to 6e and loaded with tikosyn - patient also converted to SB with PACs overnight of 6/10; lopressor was discontinued   Acute on chronic diastolic CHF (congestive heart failure) ([place]) EF 55-60% in Jan 2023, grade 1  DD and normal valves at that time. -Patient has chronic lower extremity lymphedema at baseline  Acute metabolic encephalopathy - some probable underlying degree of cognitive impairment per discussion with daughter; patient did become agitated with last CVA, attempting to get OOB and pull at IV lines, etc.  - suspect Melanie Lucas will have a component of delirium given underlying cognitive impairment -Continue low-dose Ativan for agitation - no longer requiring sitter or restraints   Neck pain Patient concerned about "cracking" in her neck after a recent fall.  MRI C-spine unremarkable.  Acquired thrombophilia ([place]) - Continue Eliquis  Myocardial injury Troponin leak due to rapid afib.  Myocardial infarction ruled out, no further ischemic work up at this time.  Nicotine dependence, cigarettes, uncomplicated - Nicotine patch  COPD (chronic obstructive pulmonary disease) (HCC) No active flare, not on inhlaers at home  Type 2 diabetes mellitus without complication, without long-term current use of insulin (HCC) A1c 7.3% - Continue SSI - Hold Prandin, jardiance, metformin  Mixed diabetic hyperlipidemia associated with type 2 diabetes mellitus (HCC) LDL 91 -Statin intolerant.  Continue Zetia  Pulmonary hypertension ([place]) See above  Coronary artery disease involving native coronary artery of native heart without angina pectoris Statin intolerant -Continue Eliquis, Zetia -Resume losartan  Essential hypertension, benign - Continue losartan   Old records reviewed in assessment of this patient  Antimicrobials:   DVT prophylaxis:  SCDs Start: 07/24/21 2309 apixaban (ELIQUIS) tablet 5 mg   Code Status:   Code Status: Full Code  Disposition Plan: SNF Status is: Inpatient  Objective: Blood pressure 131/82, pulse (!) 55, temperature (!) 97.5 F (36.4 C), temperature source Oral, resp. rate 16, height 5'  7" (1.702 m), weight 66.2 kg, SpO2 96 %.  Examination:  Physical  Exam Constitutional:      General: Melanie Lucas is not in acute distress. HENT:     Head: Normocephalic and atraumatic.     Mouth/Throat:     Mouth: Mucous membranes are moist.  Eyes:     Extraocular Movements: Extraocular movements intact.  Cardiovascular:     Rate and Rhythm: Normal rate. Rhythm irregular.  Pulmonary:     Effort: Pulmonary effort is normal.     Breath sounds: Normal breath sounds.  Abdominal:     General: Bowel sounds are normal. There is no distension.     Palpations: Abdomen is soft.     Tenderness: There is no abdominal tenderness.  Musculoskeletal:        General: Normal range of motion.     Cervical back: Normal range of motion and neck supple.  Skin:    General: Skin is warm and dry.  Neurological:     Comments: Tracking better with eyes and follows commands and moves all 4 extremities      Consultants:  Neurology Cardiology  Procedures:    Data Reviewed: Results for orders placed or performed during the hospital encounter of 07/24/21 (from the past 24 hour(s))  Glucose, capillary     Status: Abnormal   Collection Time: 07/29/21  4:53 PM  Result Value Ref Range   Glucose-Capillary 182 (H) 70 - 99 mg/dL  Glucose, capillary     Status: Abnormal   Collection Time: 07/29/21  9:40 PM  Result Value Ref Range   Glucose-Capillary 289 (H) 70 - 99 mg/dL   Comment 1 Notify RN    Comment 2 Document in Chart   Basic metabolic panel     Status: Abnormal   Collection Time: 07/30/21 12:19 AM  Result Value Ref Range   Sodium 139 135 - 145 mmol/L   Potassium 3.3 (L) 3.5 - 5.1 mmol/L   Chloride 106 98 - 111 mmol/L   CO2 24 22 - 32 mmol/L   Glucose, Bld 197 (H) 70 - 99 mg/dL   BUN 32 (H) 8 - 23 mg/dL   Creatinine, Ser 0.60 0.44 - 1.00 mg/dL   Calcium 9.1 8.9 - 10.3 mg/dL   GFR, Estimated >60 >60 mL/min   Anion gap 9 5 - 15  Magnesium     Status: None   Collection Time: 07/30/21 12:19 AM  Result Value Ref Range   Magnesium 2.1 1.7 - 2.4 mg/dL  Glucose,  capillary     Status: Abnormal   Collection Time: 07/30/21  6:31 AM  Result Value Ref Range   Glucose-Capillary 101 (H) 70 - 99 mg/dL   Comment 1 Notify RN    Comment 2 Document in Chart   Basic metabolic panel     Status: Abnormal   Collection Time: 07/30/21 10:50 AM  Result Value Ref Range   Sodium 138 135 - 145 mmol/L   Potassium 3.5 3.5 - 5.1 mmol/L   Chloride 105 98 - 111 mmol/L   CO2 24 22 - 32 mmol/L   Glucose, Bld 213 (H) 70 - 99 mg/dL   BUN 31 (H) 8 - 23 mg/dL   Creatinine, Ser 0.74 0.44 - 1.00 mg/dL   Calcium 8.7 (L) 8.9 - 10.3 mg/dL   GFR, Estimated >60 >60 mL/min   Anion gap 9 5 - 15  Magnesium     Status: None   Collection Time: 07/30/21 10:50 AM  Result Value  Ref Range   Magnesium 1.9 1.7 - 2.4 mg/dL  Glucose, capillary     Status: Abnormal   Collection Time: 07/30/21 11:32 AM  Result Value Ref Range   Glucose-Capillary 204 (H) 70 - 99 mg/dL  Glucose, capillary     Status: Abnormal   Collection Time: 07/30/21 12:30 PM  Result Value Ref Range   Glucose-Capillary 182 (H) 70 - 99 mg/dL    I have Reviewed nursing notes, Vitals, and Lab results since pt's last encounter. Pertinent lab results : see above I have ordered test including BMP, CBC, Mg I have reviewed the last note from staff over past 24 hours I have discussed pt's care plan and test results with nursing staff, case manager   LOS: 5 days   Dwyane Dee, MD [facility] 07/30/2021, 2:12 PM

## 2021-07-30 NOTE — Progress Notes (Signed)
   07/30/21 0263  Urine Characteristics  Bladder Scan Volume (mL) 228 mL   Patient did not have any urine output throughout the night, BS volumes did not indicate any intervention. Will report off to day shift.

## 2021-07-30 NOTE — Progress Notes (Signed)
   07/30/21 0300  Assess: MEWS Score  ECG Heart Rate (!) 47  Resp 14  Assess: MEWS Score  MEWS Temp 0  MEWS Systolic 0  MEWS Pulse 1  MEWS RR 0  MEWS LOC 0  MEWS Score 1  MEWS Score Color Green  Assess: if the MEWS score is Yellow or Red  Were vital signs taken at a resting state? Yes  Focused Assessment Change from prior assessment (see assessment flowsheet)  Does the patient meet 2 or more of the SIRS criteria? No  MEWS guidelines implemented *See Row Information* No, previously yellow, continue vital signs every 4 hours  Take Vital Signs  Increase Vital Sign Frequency  Yellow: Q 2hr X 2 then Q 4hr X 2, if remains yellow, continue Q 4hrs  Escalate  MEWS: Escalate Yellow: discuss with charge nurse/RN and consider discussing with provider and RRT  Assess: SIRS CRITERIA  SIRS Temperature  0  SIRS Pulse 0  SIRS Respirations  0  SIRS WBC 0  SIRS Score Sum  0     Continuing to monitor HR/Rhythm for any changes.

## 2021-07-30 NOTE — Progress Notes (Addendum)
CCMD called with a change of rate/rhythm. Mobitz type 2, 12 lead EKG performed results reviewed with [facility] physician, Dr. Alcario Drought.    BMP reviewed, requested K level be evaluated.   VSS, stable, will continue to monitor.

## 2021-07-30 NOTE — Progress Notes (Signed)
8 beat run of Vtach reported to [facility], BP stable. Pt asleep and resting.   Will continue to monitor.

## 2021-07-30 NOTE — Consult Note (Signed)
Cardiology Consultation:   Patient ID: Melanie Lucas MRN: [id number]; DOB: 28-Jun-1950  Admit date: 07/24/2021 Date of Consult: 07/30/2021  PCP:  Kristen Loader, [place] Providers Cardiologist:  Ena Dawley, MD  Electrophysiologist:  Thompson Grayer, MD       Patient Profile:   Melanie Lucas is a 71 y.o. female with a hx of lymphedema, COPD, diabetes, hypertension, hyperlipidemia, stroke, PVD, atrial fibrillation, coronary artery disease who is being seen 07/30/2021 for the evaluation of atrial fibrillation and dofetilide at the request of Dwyane Dee.  History of Present Illness:   Ms. Deal presented to the hospital with headache, neck pain, confusion.  Brain MRI showed acute/early subacute infarct involving left basal ganglia and corona radiata.  Throughout hospital stay, she has had episodes of atrial fibrillation.  She has also had episodes of bradycardia.  She was put on a diltiazem drip, which was stopped due to bradycardia.  Her dofetilide was also held for multiple doses throughout her hospitalization.  Currently she feels well.  She has previously been well controlled on dofetilide.  She would like to restart the medication.   Past Medical History:  Diagnosis Date   Anxiety    Cancer ([place])    Skin- leg    Chronic acquired lymphedema    a. R>L;  b. 03/2012 Neg LE U/S for DVT. Legs since age 69   Complication of anesthesia    woke up during colonoscopy, Lithrostrixpy, Biospy   COPD (chronic obstructive pulmonary disease) ([place])    Coronary artery disease    CVD (cardiovascular disease)    Deaf, right    Diabetes mellitus    Type II   Diabetic neuropathy ([place])    Dysrhythmia    afib   Elevated TSH    a. 10/2012 - inst to f/u PCP.   Full dentures    Hematuria    a. while on pradaxa,  she reports that she has seen Dr Margie Ege and had low risk cystoscopy   History of kidney stones    Hyperlipidemia    Hypertension    patient denies   Incontinence    prior to  urinating   Lacunar infarction [facility])    a. 02/2009 non-acute Lacunar infarct of the right thalamus noted on MRI of brain.   Left carotid stenosis    Lymphedema of extremity    had this problem, since she was a teenager. especially seen in R LE   Meniere disease    Myocardial infarction [facility])    Obesity    Osteoarthritis    cervical & lumbar region, knees, hands cramp also    PAF with post-termination pauses    a. on dronedarone;  b. CHA2DS2VASc = 5 (HTN, DM, h/o lacunar infarct on MRI, Female) ->refused oral anticoagulation after h/o hematuria on pradaxa;  c. 02/2012 Echo: EF 55-60%, mildly dil LA. D. Recurrent PAF 10/2012 after only taking Multaq 1x/day - spont conv to NSR, placed back on BID Multaq/eliquis;  e. 10/2014 Multaq d/c'd->tikosyn initiated.   Shortness of breath dyspnea    with exertion   Stroke [facility])    2 mini strokes   Tobacco abuse    Vision abnormalities     Past Surgical History:  Procedure Laterality Date   ANTERIOR CERVICAL CORPECTOMY N/A 02/21/2016   Procedure: ANTERIOR CERVICAL CORPECTOMY ANS FUSION CERVICAL SIX , ANTERIOR PLATING CERVICAL FIVE-SEVEN;  Surgeon: Consuella Lose, MD;  Location: [place];  Service: Neurosurgery;  Laterality: N/A;  ANTERIOR FUSION CERVICAL SPINE  02/21/2016   Biopsy of adrenal glands     BREAST BIOPSY Bilateral    benign results, both breasts    CARDIAC CATHETERIZATION  2014   x1 stent placed, done in [place]    CARDIOVERSION N/A 11/11/2014   Procedure: CARDIOVERSION;  Surgeon: Larey Dresser, MD;  Location: [place];  Service: Cardiovascular;  Laterality: N/A;   CAROTID ENDARTERECTOMY Left 07/29/2017   COLONOSCOPY W/ POLYPECTOMY     CORONARY STENT PLACEMENT     ENDARTERECTOMY Left 07/29/2017   Procedure: ENDARTERECTOMY CAROTID LEFT;  Surgeon: Rosetta Posner, MD;  Location: [place];  Service: Vascular;  Laterality: Left;   PATCH ANGIOPLASTY Left 07/29/2017   Procedure: PATCH ANGIOPLASTY USING HEMASHIELD GOLD VASCULAR PATCH;   Surgeon: Rosetta Posner, MD;  Location: [place];  Service: Vascular;  Laterality: Left;   TEE WITHOUT CARDIOVERSION N/A 11/11/2014   Procedure: TRANSESOPHAGEAL ECHOCARDIOGRAM (TEE);  Surgeon: Larey Dresser, MD;  Location: [place];  Service: Cardiovascular;  Laterality: N/A;     Home Medications:  Prior to Admission medications   Medication Sig Start Date End Date Taking? Authorizing Provider  acetaminophen (TYLENOL) 500 MG tablet Take 1,000 mg by mouth every 6 (six) hours as needed for mild pain or headache.   Yes [provider]  diazepam (VALIUM) 2 MG tablet Take 2 mg by mouth daily. 07/18/21  Yes [provider]  dofetilide (TIKOSYN) 500 MCG capsule Take 1 capsule (500 mcg total) by mouth 2 (two) times daily. Appointment Required For Further Refills [phone] 07/18/21  Yes Sherran Needs, NP  DULoxetine HCl 30 MG CSDR Take 30 mg by mouth daily. 07/14/19  Yes [provider]  ELIQUIS 5 MG TABS tablet TAKE 1 TABLET BY MOUTH TWICE A DAY Patient taking differently: Take 5 mg by mouth 2 (two) times daily. 10/31/20  Yes Allred, Jeneen Rinks, MD  Empagliflozin-metFORMIN HCl 12.06-998 MG TABS Take 1 tablet by mouth 2 (two) times daily.   Yes [provider]  furosemide (LASIX) 20 MG tablet Take 20 mg by mouth daily.   Yes [provider]  linaclotide (LINZESS) 290 MCG CAPS capsule Take 290 mcg by mouth daily before breakfast.   Yes [provider]  losartan (COZAAR) 50 MG tablet TAKE 1 TABLET BY MOUTH EVERY DAY Patient taking differently: Take 50 mg by mouth daily. 03/20/21  Yes Allred, Jeneen Rinks, MD  mirabegron ER (MYRBETRIQ) 50 MG TB24 tablet Take 50 mg by mouth daily.   Yes [provider]  Multiple Vitamin (MULTIVITAMIN) tablet Take 1 tablet by mouth at bedtime.    Yes [provider]  OVER THE COUNTER MEDICATION Take 15 mLs by mouth daily. SEAMOSS gel   Yes [provider]  repaglinide (PRANDIN) 2 MG tablet Take 2 mg by  mouth in the morning and at bedtime. 12/20/18  Yes [provider]  nicotine (NICODERM CQ - DOSED IN MG/24 HOURS) 14 mg/24hr patch Place 1 patch (14 mg total) onto the skin daily. Patient not taking: Reported on 07/25/2021 03/11/21   Bonnielee Haff, MD  potassium chloride SA (KLOR-CON M) 20 MEQ tablet Take 1 tablet (20 mEq total) by mouth daily. Patient not taking: Reported on 07/25/2021 05/15/21   Baldwin Jamaica, PA-C    Inpatient Medications: Scheduled Meds:  apixaban  5 mg Oral BID   dofetilide  500 mcg Oral BID   DULoxetine  60 mg Oral Daily   ezetimibe  10 mg Oral Daily  insulin aspart  0-15 Units Subcutaneous TID AC & HS   linaclotide  290 mcg Oral QAC breakfast   losartan  50 mg Oral Daily   mirabegron ER  50 mg Oral Daily   potassium chloride  40 mEq Oral Once   sodium chloride flush  3 mL Intravenous Q12H   Continuous Infusions:  PRN Meds: acetaminophen **OR** acetaminophen, LORazepam, melatonin, ondansetron **OR** ondansetron (ZOFRAN) IV, polyethylene glycol  Allergies:    Allergies  Allergen Reactions   Alendronate Sodium     Other reaction(s): pain all over   Avelox [Moxifloxacin]     Unknown reaction    Fosamax [Alendronate]     Pain all over   Pradaxa [Dabigatran Etexilate Mesylate]     Extreme bleeding   Statins Nausea And Vomiting and Other (See Comments)    Muscle pain, Dizziness (intolerance)   Sulfa Antibiotics     Other reaction(s): vomiting   Sulfamethoxazole Nausea And Vomiting    Dizziness (intolerance)   Sulfonamide Derivatives Hives    Nausea vertigo   Varenicline     nightmares Other reaction(s): nightmares   Ciprofloxacin Rash    Other reaction(s): unknown   Gabapentin Rash    burning   Tape Itching and Rash    Please use "paper" tape only    Social History:   Social History   Socioeconomic History   Marital status: Married    Spouse name: Ronny   Number of children: 2   Years of education: 18   Highest education level:  Master's degree (e.g., MA, MS, MEng, MEd, MSW, MBA)  Occupational History   Occupation: retired    Comment: Pharmacist, hospital - art  Tobacco Use   Smoking status: Every Day    Packs/day: 1.00    Years: 55.00    Total pack years: 55.00    Types: Cigarettes   Smokeless tobacco: Never   Tobacco comments:    1 pack daily 08/15/2020  Vaping Use   Vaping Use: Never used  Substance and Sexual Activity   Alcohol use: Yes    Alcohol/week: 1.0 standard drink of alcohol    Types: 1 Glasses of wine per week   Drug use: No   Sexual activity: Yes  Other Topics Concern   Not on file  Social History Narrative   Lives with daughter and grandson   Right handed   Drinks 3-5 cups caffeine daily   She operates an entertainment business   Social Determinants of Radio broadcast assistant Strain: Not on file  Food Insecurity: Not on file  Transportation Needs: Not on file  Physical Activity: Not on file  Stress: Not on file  Social Connections: Not on file  Intimate Partner Violence: Not on file    Family History:    Family History  Problem Relation Age of Onset   Heart attack Father    Coronary artery disease Father        strong family hx   Bladder Cancer Mother        bladder   Leukemia Brother    Breast cancer Maternal Aunt    Breast cancer Paternal Aunt    Breast cancer Maternal Aunt      ROS:  Please see the history of present illness.   All other ROS reviewed and negative.     Physical Exam/Data:   Vitals:   07/30/21 0606 07/30/21 0606 07/30/21 0631 07/30/21 0755  BP:  135/68  127/71  Pulse:  (!) 40  60  Resp: '18  15 16  '$ Temp:    98 F (36.7 C)  TempSrc:    Oral  SpO2:    98%  Weight:      Height:        Intake/Output Summary (Last 24 hours) at 07/30/2021 1042 Last data filed at 07/29/2021 1700 Gross per 24 hour  Intake 340 ml  Output 425 ml  Net -85 ml      07/24/2021   10:46 AM 05/12/2021    3:20 PM 03/06/2021    5:18 PM  Last 3 Weights  Weight (lbs) 145 lb  15.1 oz 146 lb 160 lb  Weight (kg) 66.2 kg 66.225 kg 72.576 kg     Body mass index is 22.86 kg/m.  General:  Well nourished, well developed, in no acute distress HEENT: normal Neck: no JVD Vascular: No carotid bruits; Distal pulses 2+ bilaterally Cardiac:  normal S1, S2; RRR; no murmur  Lungs:  clear to auscultation bilaterally, no wheezing, rhonchi or rales  Abd: soft, nontender, no hepatomegaly  Ext: no edema Musculoskeletal:  No deformities, BUE and BLE strength normal and equal Skin: warm and dry  Neuro:  CNs 2-12 intact, no focal abnormalities noted Psych:  Normal affect   EKG:  The EKG was personally reviewed and demonstrates: Sinus rhythm, right bundle branch block Telemetry:  Telemetry was personally reviewed and demonstrates: Sinus rhythm  Relevant CV Studies: TTE 03/07/21  1. Left ventricular ejection fraction, by estimation, is 55 to 60%. The  left ventricle has normal function. The left ventricle has no regional  wall motion abnormalities. There is mild concentric left ventricular  hypertrophy. Left ventricular diastolic  parameters are consistent with Grade I diastolic dysfunction (impaired  relaxation).   2. Right ventricular systolic function is normal. The right ventricular  size is normal. Tricuspid regurgitation signal is inadequate for assessing  PA pressure.   3. The mitral valve is grossly normal. Trivial mitral valve  regurgitation. No evidence of mitral stenosis.   4. The aortic valve is tricuspid. Aortic valve regurgitation is not  visualized. No aortic stenosis is present.   5. The inferior vena cava is normal in size with greater than 50%  respiratory variability, suggesting right atrial pressure of 3 mmHg.   Laboratory Data:  High Sensitivity Troponin:   Recent Labs  Lab 07/24/21 1358 07/24/21 1547  TROPONINIHS 29* 30*     Chemistry Recent Labs  Lab 07/25/21 0315 07/26/21 0549 07/30/21 0019  NA 141 138 139  K 4.1 3.8 3.3*  CL 103 106  106  CO2 '28 22 24  '$ GLUCOSE 172* 111* 197*  BUN 15 18 32*  CREATININE 0.75 0.67 0.60  CALCIUM 8.8* 8.9 9.1  MG 2.0  --  2.1  GFRNONAA >60 >60 >60  ANIONGAP '10 10 9    '$ Recent Labs  Lab 07/24/21 1358 07/25/21 0315 07/26/21 0549  PROT 6.0* 5.2* 5.8*  ALBUMIN 3.8 3.1* 3.3*  AST 10* 12* 19  ALT '9 13 19  '$ ALKPHOS 54 62 68  BILITOT 0.6 1.2 1.3*   Lipids  Recent Labs  Lab 07/25/21 0315  CHOL 181  TRIG 205*  HDL 49  LDLCALC 91  CHOLHDL 3.7    Hematology Recent Labs  Lab 07/24/21 1358 07/25/21 0315 07/26/21 0549  WBC 6.1 6.6 8.9  RBC 5.09 4.93 5.17*  HGB 15.9* 15.6* 16.6*  HCT 47.3* 46.2* 46.9*  MCV 92.9 93.7 90.7  MCH 31.2 31.6 32.1  MCHC 33.6 33.8 35.4  RDW  12.8 12.7 12.6  PLT 207 209 235   Thyroid  Recent Labs  Lab 07/25/21 0315  TSH 3.505    BNPNo results for input(s): "BNP", "PROBNP" in the last 168 hours.  DDimer No results for input(s): "DDIMER" in the last 168 hours.   Radiology/Studies:  No results found.   Assessment and Plan:   Paroxysmal atrial fibrillation: CHA2DS2-VASc of 7.  Would continue anticoagulation with Eliquis.  She has missed multiple doses of her dofetilide including 2 doses on her own.  Due to that, she Raiza Kiesel need a repeat Tikosyn load.  We Kaelyn Innocent give her her dose this morning which Aaliyan Brinkmeier be her fourth dose as he had previously had 3 consecutive doses.  She Natonya Finstad need an ECG after each dose.  Would keep potassium greater than 4 magnesium greater than 2.  We Jovita Persing move her to 6 E. for loading.   For questions or updates, please contact [place] Please consult [email or web address] for contact info under    Signed, Jazel Nimmons Meredith Leeds, MD  07/30/2021 10:42 AM

## 2021-07-30 NOTE — Progress Notes (Signed)
I verified with Dr. Virgilio Belling, he wants '15mg'$  IV Cardizem x1, give home-dose tikosyn and the increased dose of metoprolol PO.

## 2021-07-30 NOTE — Progress Notes (Signed)
EKG reveiwed with Dr. Curt Bears.  Will continue tikosyn at 500 mcg BID. Appreciate timing help from pharmacy.

## 2021-07-30 NOTE — Progress Notes (Signed)
Replacing K.  Concern by CCMD for [facility] 2.  Repeat EKGs performed x2 and again shows S. Loletha Grayer with a PAC.  BP continues to be good.

## 2021-07-30 NOTE — Progress Notes (Addendum)
Pharmacy: Dofetilide (Tikosyn) - Follow Up Assessment and Electrolyte Replacement  Pharmacy consulted to assist in monitoring and replacing electrolytes in this 71 y.o. female admitted on 07/24/2021 undergoing dofetilide re-initiation. First dofetilide dose: 500 mg BID on 07/30/2021.   Labs:    Component Value Date/Time   K 3.5 07/30/2021 1050   MG 1.9 07/30/2021 1050     Plan:  - Re-initiate on Tikosyn 500 mg BID. - EKG from this morning with QTc > 500 - per Dr. Curt Bears, will reassess if dose-reduction is needed this afternoon.  - K was replaced with 40 mEq x 1 ~1100, and BMET was collected shortly after and K resulted at 3.5 (likely does not reflect absorption of this dose). Will give an additional 20 mEq x 1 (total 60 mEq replacement per protocol) and recheck BMET prior to evening dose.   Potassium: K 3.5-3.7:  Give KCl 60 mEq po x1   Magnesium: Mg 1.8-2: Give Mg 2 gm IV x1    Thank you for allowing pharmacy to participate in this patient's care   Vance Peper, PharmD PGY1 Pharmacy Resident Phone [phone] 07/30/2021 11:59 AM   Please check AMION for all [place] phone numbers After 10:00 PM, call [place] [phone]

## 2021-07-30 NOTE — Progress Notes (Signed)
Received call from Pharmacy regarding the first tikosyn dose. BMP is pending on this patient with a 3.3K, I did administer 74mq K PO.  I have orders not to administer this medication unless the K is 4, Mag is 1.8.

## 2021-07-30 NOTE — Progress Notes (Signed)
I spoke with Ouida Sills, pt's daughter and let her know we transferred her to [facility] cardiology for cardiac med titration.

## 2021-07-30 NOTE — Progress Notes (Signed)
BMET this evening:  SCr up to 1.04, CrCL 51 ml/min using TBW K and Mag WNL  Discussed with Dr. Marcelle Smiling, reduce Tikosyn to 258mg PO BID. F/U AM labs  Melanie Lucas D. DMina Marble PharmD, BCPS, BFort [place]6/12/2021, 7:51 PM

## 2021-07-31 DIAGNOSIS — I480 Paroxysmal atrial fibrillation: Secondary | ICD-10-CM | POA: Diagnosis not present

## 2021-07-31 DIAGNOSIS — I639 Cerebral infarction, unspecified: Secondary | ICD-10-CM | POA: Diagnosis not present

## 2021-07-31 LAB — BASIC METABOLIC PANEL
Anion gap: 7 (ref 5–15)
BUN: 24 mg/dL — ABNORMAL HIGH (ref 8–23)
CO2: 24 mmol/L (ref 22–32)
Calcium: 8.5 mg/dL — ABNORMAL LOW (ref 8.9–10.3)
Chloride: 107 mmol/L (ref 98–111)
Creatinine, Ser: 0.73 mg/dL (ref 0.44–1.00)
GFR, Estimated: >60 mL/min (ref >60)
Glucose, Bld: 117 mg/dL — ABNORMAL HIGH (ref 70–99)
Potassium: 4.2 mmol/L (ref 3.5–5.1)
Sodium: 138 mmol/L (ref 135–145)

## 2021-07-31 LAB — MAGNESIUM: Magnesium: 2.1 mg/dL (ref 1.7–2.4)

## 2021-07-31 LAB — GLUCOSE, CAPILLARY
Glucose-Capillary: 132 mg/dL — ABNORMAL HIGH (ref 70–99)
Glucose-Capillary: 194 mg/dL — ABNORMAL HIGH (ref 70–99)
Glucose-Capillary: 247 mg/dL — ABNORMAL HIGH (ref 70–99)
Glucose-Capillary: 156 mg/dL — ABNORMAL HIGH (ref 70–99)

## 2021-07-31 NOTE — Care Management Important Message (Signed)
Important Message  Patient Details  Name: Melanie Lucas MRN: [id number] Date of Birth: 1951/02/07   Medicare Important Message Given:  Yes     Shelda Altes 07/31/2021, 8:28 AM

## 2021-07-31 NOTE — Progress Notes (Signed)
Pharmacy: Dofetilide (Tikosyn) - Follow Up Assessment and Electrolyte Replacement  Pharmacy consulted to assist in monitoring and replacing electrolytes in this 71 y.o. female admitted on 07/24/2021 undergoing dofetilide re-initiation.   Labs:    Component Value Date/Time   K 4.2 07/31/2021 0131   MG 2.1 07/31/2021 0131     Plan: Potassium: K >/= 4: No additional supplementation needed  Magnesium: Mg > 2: No additional supplementation needed   As patient has required on average 30 mEq of potassium replacement every day, recommend discharging patient with prescription for:  Potassium chloride 20 mEq  daily. This was him home dose. If he is discharging on po lasix, consider 10mq potassium chloride daily  Thank you for allowing pharmacy to participate in this patient's care    AHildred Laser PharmD Clinical Pharmacist **Pharmacist phone directory can now be found on [email or web address] (PW [email or web address]).  Listed under [place]

## 2021-07-31 NOTE — Progress Notes (Addendum)
Electrophysiology Rounding Note  Patient Name: Melanie Lucas Date of Encounter: 07/31/2021  Primary Cardiologist: Ena Dawley, MD  Electrophysiologist: Greggory Brandy to transition to [facility]   Subjective   Pt  remains in NSR  on Tikosyn 250 mcg BID   QTc from EKG last pm shows stable QTc.  The patient is doing well today.  At this time, the patient denies chest pain, shortness of breath, or any new concerns.  Inpatient Medications    Scheduled Meds:  apixaban  5 mg Oral BID   dofetilide  250 mcg Oral BID   DULoxetine  60 mg Oral Daily   ezetimibe  10 mg Oral Daily   insulin aspart  0-15 Units Subcutaneous TID AC & HS   linaclotide  290 mcg Oral QAC breakfast   losartan  50 mg Oral Daily   mirabegron ER  50 mg Oral Daily   sodium chloride flush  3 mL Intravenous Q12H   Continuous Infusions:  PRN Meds: acetaminophen **OR** acetaminophen, LORazepam, melatonin, ondansetron **OR** ondansetron (ZOFRAN) IV, polyethylene glycol   Vital Signs    Vitals:   07/30/21 1624 07/30/21 2036 07/31/21 0024 07/31/21 0511  BP: 140/71 (!) 154/80 (!) 145/79 138/68  Pulse:  61 (!) 52 64  Resp: 15 (!) '8 13 14  '$ Temp: 98.4 F (36.9 C) 98.4 F (36.9 C) 97.8 F (36.6 C) 97.8 F (36.6 C)  TempSrc: Oral Oral Oral Oral  SpO2:      Weight:      Height:        Intake/Output Summary (Last 24 hours) at 07/31/2021 0719 Last data filed at 07/30/2021 1700 Gross per 24 hour  Intake 130 ml  Output 590 ml  Net -460 ml   Filed Weights   07/24/21 1046  Weight: 66.2 kg    Physical Exam    GEN- The patient is well appearing, alert and oriented x 3 today.   Head- normocephalic, atraumatic Eyes-  Sclera clear, conjunctiva pink Ears- hearing intact Oropharynx- clear Neck- supple Lungs- Clear to ausculation bilaterally, normal work of breathing Heart- Regular rate and rhythm, no murmurs, rubs or gallops GI- soft, NT, ND, + BS Extremities- no clubbing, cyanosis, or edema Skin- no rash or lesion Psych-  euthymic mood, full affect Neuro- strength and sensation are intact  Labs    CBC No results for input(s): "WBC", "NEUTROABS", "HGB", "HCT", "MCV", "PLT" in the last 72 hours. Basic Metabolic Panel Recent Labs    07/30/21 1726 07/31/21 0131  NA 139 138  K 4.4 4.2  CL 106 107  CO2 23 24  GLUCOSE 189* 117*  BUN 31* 24*  CREATININE 1.04* 0.73  CALCIUM 8.4* 8.5*  MG 2.2 2.1    Potassium  Date/Time Value Ref Range Status  07/31/2021 01:31 AM 4.2 3.5 - 5.1 mmol/L Final   Magnesium  Date/Time Value Ref Range Status  07/31/2021 01:31 AM 2.1 1.7 - 2.4 mg/dL Final    Comment:    Performed at [facility], [place] [address] [place], [place] [address]    Telemetry    NSR with PACs 60s (personally reviewed)  Radiology    No results found.   Patient Profile     Melanie Lucas is a 71 y.o. female with a past medical history significant for persistent atrial fibrillation.  They were admitted for tikosyn load.   Assessment & Plan    Persistent atrial fibrillation Pt remains in NSR on  Tikosyn 250 mcg BID  Continue  Eliquis Electrolytes stable.  CHA2DS2VASC is at least 7   If QT remains stable Brittinie Wherley finish load (6th dose) after am dose and be OK for discharge from EP perspective. Delrae Hagey review EKG at that time.   Kristoph Sattler plan follow up in AF clinic next week.   For questions or updates, please contact [place] Please consult [email or web address] for contact info under Cardiology/STEMI.  Signed, Shirley Friar, PA-C  07/31/2021, 7:19 AM   I have seen and examined this patient with Oda Kilts.  Agree with above, note added to reflect my findings.  Dofetilide load in progress.  Patient without complaint.  GEN: Well nourished, well developed, in no acute distress  HEENT: normal  Neck: no JVD, carotid bruits, or masses Cardiac: RRR; no murmurs, rubs, or gallops,no edema  Respiratory:  clear to auscultation bilaterally, normal work of breathing GI: soft, nontender,  nondistended, + BS MS: no deformity or atrophy  Skin: warm and dry Neuro:  Strength and sensation are intact Psych: euthymic mood, full affect   Paroxysmal atrial fibrillation: Dofetilide load currently in progress.  Currently on 250 mcg twice daily.  We Miah Boye continue Eliquis.  CHA2DS2-VASc of at least 7.  If her QT remained stable after this dose, EP to sign off.  We Avonelle Viveros arrange for follow-up in A-fib clinic.  Dotsie Gillette M. Laster Appling MD 07/31/2021 8:16 AM

## 2021-07-31 NOTE — Progress Notes (Signed)
Inpatient Diabetes Program Recommendations  AACE/ADA: New Consensus Statement on Inpatient Glycemic Control (2015)  Target Ranges:  Prepandial:   less than 140 mg/dL      Peak postprandial:   less than 180 mg/dL (1-2 hours)      Critically ill patients:  140 - 180 mg/dL   Lab Results  Component Value Date   GLUCAP 132 (H) 07/31/2021   HGBA1C 7.3 (H) 07/25/2021    Review of Glycemic Control  Latest Reference Range & Units 07/30/21 06:31 07/30/21 11:32 07/30/21 12:30 07/30/21 16:22 07/30/21 21:26 07/31/21 08:19  Glucose-Capillary 70 - 99 mg/dL 101 (H) 204 (H) 182 (H)  Novolog 3 untis given 159 (H)  Novolog 3 units tid 270 (H) 132 (H)   Diabetes history: DM 2 Outpatient Diabetes medications: Jardiance-metformin 12.06-998 mg bid, Prandin 2 mg bid morning and bedtime Current orders for Inpatient glycemic control:  Novolog 0-15 units tid and hs  Inpatient Diabetes Program Recommendations:    Note Pt takes meal coverage prandin at home  -   Pt may benefit from Novolog 2 units tid meal coverage if eating >50% of meals and glucose is at least 80 mg/dl. -   Reduce Novolog correction scale to 0-9 units tid + hs  Thanks,  Tama Headings RN, MSN, BC-ADM Inpatient Diabetes Coordinator Team Pager [phone] (8a-5p)

## 2021-07-31 NOTE — TOC Progression Note (Signed)
Transition of Care [facility]) - Progression Note    Patient Details  Name: Melanie Lucas MRN: [id number] Date of Birth: 08/23/1950  Transition of Care [facility]) CM/SW [place], [place] Phone Number: 07/31/2021, 12:07 PM  Clinical Narrative:     CSW started insurance authorization for patient reference number # [id number]. CSW spoke with Sharyn Lull at [facility] who confirmed SNF bed for patient when medically ready. CSW will continue to follow and assist with patients dc planning needs.   Expected Discharge Plan: [place] Barriers to Discharge: Continued Medical Work up, Ship broker  Expected Discharge Plan and Services Expected Discharge Plan: [place] Choice: [place] arrangements for the past 2 months: Single Family Home                                       Social Determinants of Health (SDOH) Interventions    Readmission Risk Interventions     No data to display

## 2021-07-31 NOTE — Progress Notes (Signed)
Mobility Specialist Progress Note    07/31/21 1503  Mobility  Activity Ambulated with assistance to bathroom  Level of Assistance Contact guard assist, steadying assist  Assistive Device Front wheel walker  Distance Ambulated (ft) 10 ft  Activity Response Tolerated well  $Mobility charge 1 Mobility   Pt received and agreeable. Encouraged to pull string when ready to get up.   Hildred Alamin Mobility Specialist

## 2021-07-31 NOTE — Progress Notes (Signed)
Progress Note    Melanie Lucas   LFY:[id number]  DOB: Oct 27, 1950  DOA: 07/24/2021     6 PCP: Melanie Loader, FNP  Initial CC: Confusion  Hospital Course: Melanie Lucas is a 72 y.o. female with pAF on Eliquis and dofetilide, recent stroke Jan 2023, CAD s/p PCI remote, DM, PVD carotid, HTN, dCHF and smoking who presented with headache, neck pain and confusion.  MRI brain showed acute/early subacute infarct involving left basal ganglia and left corona radiata.  Unchanged C5-7 ACDF with moderate right neural foraminal stenosis and mild spinal canal stenosis at C6-7 level.  Large area of myelomalacia within the cervical spinal cord at C5-6 levels, unchanged. Neurology was also consulted on admission.  Interval History:  No events overnight.  Discussed medication compliance with patient bedside this morning.  She is otherwise feeling well and had no concerns when seen this morning. Tikosyn loading complete. We will plan to discharge to [facility] on Tuesday.  Assessment and Plan: * Acute ischemic stroke ([place]) - MRI shows acute and subacute infarcts in L BG and corona radiata.  Non-invasive angiography shows no severe intracranial stenoses -Continue Eliquis -Continue Zetia (statin intolerant) - Evaluated by neurology, appreciate assistance -Evaluated by PT, plan will be for SNF at discharge  Paroxysmal atrial fibrillation (HCC) - Developed RVR after admission and was started on Cardizem drip - Continue Tikosyn - Continue Eliquis -Suspect some tachycardia is induced by underlying anxiety/agitation especially from being in restraints.  Resume on low-dose benzo for now for further agitation - patient evaluated by EP cardiology and underwent reloading of Tikosyn due to missed doses; plan to continue Tikosyn 250 mg BID at discharge due to renal function (prior home dose noted was 500 mg BID)  Acute on chronic diastolic CHF (congestive heart failure) (HCC) EF 55-60% in Jan 2023, grade 1 DD and  normal valves at that time. -Patient has chronic lower extremity lymphedema at baseline  Acute metabolic encephalopathy - some probable underlying degree of cognitive impairment per discussion with daughter; patient did become agitated with last CVA, attempting to get OOB and pull at IV lines, etc.  - suspect she will have a component of delirium given underlying cognitive impairment -Continue low-dose Ativan for agitation - no longer requiring sitter or restraints   Neck pain Patient concerned about "cracking" in her neck after a recent fall.  MRI C-spine unremarkable.  Acquired thrombophilia ([place]) - Continue Eliquis  Myocardial injury Troponin leak due to rapid afib.  Myocardial infarction ruled out, no further ischemic work up at this time.  Nicotine dependence, cigarettes, uncomplicated - Nicotine patch  COPD (chronic obstructive pulmonary disease) (HCC) No active flare, not on inhlaers at home  Type 2 diabetes mellitus without complication, without long-term current use of insulin (HCC) A1c 7.3% - Continue SSI - Hold Prandin, jardiance, metformin  Mixed diabetic hyperlipidemia associated with type 2 diabetes mellitus (HCC) LDL 91 -Statin intolerant.  Continue Zetia  Pulmonary hypertension ([place]) See above  Coronary artery disease involving native coronary artery of native heart without angina pectoris Statin intolerant -Continue Eliquis, Zetia -continue losartan  Essential hypertension, benign - Continue losartan   Old records reviewed in assessment of this patient  Antimicrobials:   DVT prophylaxis:  SCDs Start: 07/24/21 2309 apixaban (ELIQUIS) tablet 5 mg   Code Status:   Code Status: Full Code  Disposition Plan: SNF -[facility], Tuesday Status is: Inpatient  Objective: Blood pressure (!) 142/66, pulse 64, temperature 98.4 F (36.9 C), temperature source Oral,  resp. rate 16, height '5\' 7"'$  (1.702 m), weight 66.2 kg, SpO2 98 %.  Examination:   Physical Exam Constitutional:      General: She is not in acute distress. HENT:     Head: Normocephalic and atraumatic.     Mouth/Throat:     Mouth: Mucous membranes are moist.  Eyes:     Extraocular Movements: Extraocular movements intact.  Cardiovascular:     Rate and Rhythm: Normal rate. Rhythm irregular.  Pulmonary:     Effort: Pulmonary effort is normal.     Breath sounds: Normal breath sounds.  Abdominal:     General: Bowel sounds are normal. There is no distension.     Palpations: Abdomen is soft.     Tenderness: There is no abdominal tenderness.  Musculoskeletal:        General: Normal range of motion.     Cervical back: Normal range of motion and neck supple.  Skin:    General: Skin is warm and dry.  Neurological:     Comments: Tracking better with eyes and follows commands and moves all 4 extremities      Consultants:  Neurology Cardiology  Procedures:    Data Reviewed: Results for orders placed or performed during the hospital encounter of 07/24/21 (from the past 24 hour(s))  Basic metabolic panel     Status: Abnormal   Collection Time: 07/30/21  5:26 PM  Result Value Ref Range   Sodium 139 135 - 145 mmol/L   Potassium 4.4 3.5 - 5.1 mmol/L   Chloride 106 98 - 111 mmol/L   CO2 23 22 - 32 mmol/L   Glucose, Bld 189 (H) 70 - 99 mg/dL   BUN 31 (H) 8 - 23 mg/dL   Creatinine, Ser 1.04 (H) 0.44 - 1.00 mg/dL   Calcium 8.4 (L) 8.9 - 10.3 mg/dL   GFR, Estimated 57 (L) >60 mL/min   Anion gap 10 5 - 15  Magnesium     Status: None   Collection Time: 07/30/21  5:26 PM  Result Value Ref Range   Magnesium 2.2 1.7 - 2.4 mg/dL  Glucose, capillary     Status: Abnormal   Collection Time: 07/30/21  9:26 PM  Result Value Ref Range   Glucose-Capillary 270 (H) 70 - 99 mg/dL  Magnesium     Status: None   Collection Time: 07/31/21  1:31 AM  Result Value Ref Range   Magnesium 2.1 1.7 - 2.4 mg/dL  Basic metabolic panel     Status: Abnormal   Collection Time: 07/31/21   1:31 AM  Result Value Ref Range   Sodium 138 135 - 145 mmol/L   Potassium 4.2 3.5 - 5.1 mmol/L   Chloride 107 98 - 111 mmol/L   CO2 24 22 - 32 mmol/L   Glucose, Bld 117 (H) 70 - 99 mg/dL   BUN 24 (H) 8 - 23 mg/dL   Creatinine, Ser 0.73 0.44 - 1.00 mg/dL   Calcium 8.5 (L) 8.9 - 10.3 mg/dL   GFR, Estimated >60 >60 mL/min   Anion gap 7 5 - 15  Glucose, capillary     Status: Abnormal   Collection Time: 07/31/21  8:19 AM  Result Value Ref Range   Glucose-Capillary 132 (H) 70 - 99 mg/dL  Glucose, capillary     Status: Abnormal   Collection Time: 07/31/21 11:45 AM  Result Value Ref Range   Glucose-Capillary 194 (H) 70 - 99 mg/dL  Glucose, capillary     Status: Abnormal  Collection Time: 07/31/21  3:40 PM  Result Value Ref Range   Glucose-Capillary 247 (H) 70 - 99 mg/dL    I have Reviewed nursing notes, Vitals, and Lab results since pt's last encounter. Pertinent lab results : see above I have ordered test including BMP, CBC, Mg I have reviewed the last note from staff over past 24 hours I have discussed pt's care plan and test results with nursing staff, case manager   LOS: 6 days   Dwyane Dee, MD [facility] 07/31/2021, 4:57 PM

## 2021-07-31 NOTE — Progress Notes (Signed)
Morning EKG reviewed    Shows remains in NSR with stable QTc .  Continue  Tikosyn 250 mcg BID.   She has been full loaded. No further monitoring required.  She may be discharged from an EP perspective on Tikosyn 250 mcg BID.   Follow up made for next week in AF clinic.    Shirley Friar, PA-C  Pager: [phone]  07/31/2021 1:57 PM

## 2021-08-01 DIAGNOSIS — D6869 Other thrombophilia: Secondary | ICD-10-CM | POA: Diagnosis not present

## 2021-08-01 DIAGNOSIS — E119 Type 2 diabetes mellitus without complications: Secondary | ICD-10-CM | POA: Diagnosis not present

## 2021-08-01 DIAGNOSIS — Z79899 Other long term (current) drug therapy: Secondary | ICD-10-CM | POA: Diagnosis not present

## 2021-08-01 DIAGNOSIS — M6281 Muscle weakness (generalized): Secondary | ICD-10-CM | POA: Diagnosis not present

## 2021-08-01 DIAGNOSIS — I110 Hypertensive heart disease with heart failure: Secondary | ICD-10-CM | POA: Diagnosis not present

## 2021-08-01 DIAGNOSIS — I5033 Acute on chronic diastolic (congestive) heart failure: Secondary | ICD-10-CM | POA: Diagnosis not present

## 2021-08-01 DIAGNOSIS — I69898 Other sequelae of other cerebrovascular disease: Secondary | ICD-10-CM | POA: Diagnosis not present

## 2021-08-01 DIAGNOSIS — I10 Essential (primary) hypertension: Secondary | ICD-10-CM | POA: Diagnosis not present

## 2021-08-01 DIAGNOSIS — R278 Other lack of coordination: Secondary | ICD-10-CM | POA: Diagnosis not present

## 2021-08-01 DIAGNOSIS — J441 Chronic obstructive pulmonary disease with (acute) exacerbation: Secondary | ICD-10-CM | POA: Diagnosis not present

## 2021-08-01 DIAGNOSIS — R531 Weakness: Secondary | ICD-10-CM | POA: Diagnosis not present

## 2021-08-01 DIAGNOSIS — Z7982 Long term (current) use of aspirin: Secondary | ICD-10-CM | POA: Diagnosis not present

## 2021-08-01 DIAGNOSIS — I2510 Atherosclerotic heart disease of native coronary artery without angina pectoris: Secondary | ICD-10-CM | POA: Diagnosis not present

## 2021-08-01 DIAGNOSIS — E118 Type 2 diabetes mellitus with unspecified complications: Secondary | ICD-10-CM | POA: Diagnosis not present

## 2021-08-01 DIAGNOSIS — Z7984 Long term (current) use of oral hypoglycemic drugs: Secondary | ICD-10-CM | POA: Diagnosis not present

## 2021-08-01 DIAGNOSIS — Z8673 Personal history of transient ischemic attack (TIA), and cerebral infarction without residual deficits: Secondary | ICD-10-CM | POA: Diagnosis not present

## 2021-08-01 DIAGNOSIS — F411 Generalized anxiety disorder: Secondary | ICD-10-CM | POA: Diagnosis not present

## 2021-08-01 DIAGNOSIS — I6340 Cerebral infarction due to embolism of unspecified cerebral artery: Secondary | ICD-10-CM | POA: Diagnosis not present

## 2021-08-01 DIAGNOSIS — Z743 Need for continuous supervision: Secondary | ICD-10-CM | POA: Diagnosis not present

## 2021-08-01 DIAGNOSIS — R2689 Other abnormalities of gait and mobility: Secondary | ICD-10-CM | POA: Diagnosis not present

## 2021-08-01 DIAGNOSIS — J449 Chronic obstructive pulmonary disease, unspecified: Secondary | ICD-10-CM | POA: Diagnosis not present

## 2021-08-01 DIAGNOSIS — I890 Lymphedema, not elsewhere classified: Secondary | ICD-10-CM | POA: Diagnosis not present

## 2021-08-01 DIAGNOSIS — E1169 Type 2 diabetes mellitus with other specified complication: Secondary | ICD-10-CM | POA: Diagnosis not present

## 2021-08-01 DIAGNOSIS — R2681 Unsteadiness on feet: Secondary | ICD-10-CM | POA: Diagnosis not present

## 2021-08-01 DIAGNOSIS — Z8249 Family history of ischemic heart disease and other diseases of the circulatory system: Secondary | ICD-10-CM | POA: Diagnosis not present

## 2021-08-01 DIAGNOSIS — R41841 Cognitive communication deficit: Secondary | ICD-10-CM | POA: Diagnosis not present

## 2021-08-01 DIAGNOSIS — I639 Cerebral infarction, unspecified: Secondary | ICD-10-CM | POA: Diagnosis not present

## 2021-08-01 DIAGNOSIS — Z7901 Long term (current) use of anticoagulants: Secondary | ICD-10-CM | POA: Diagnosis not present

## 2021-08-01 DIAGNOSIS — G459 Transient cerebral ischemic attack, unspecified: Secondary | ICD-10-CM | POA: Diagnosis not present

## 2021-08-01 DIAGNOSIS — F17210 Nicotine dependence, cigarettes, uncomplicated: Secondary | ICD-10-CM | POA: Diagnosis not present

## 2021-08-01 DIAGNOSIS — E782 Mixed hyperlipidemia: Secondary | ICD-10-CM | POA: Diagnosis not present

## 2021-08-01 DIAGNOSIS — I5032 Chronic diastolic (congestive) heart failure: Secondary | ICD-10-CM | POA: Diagnosis not present

## 2021-08-01 DIAGNOSIS — I480 Paroxysmal atrial fibrillation: Secondary | ICD-10-CM | POA: Diagnosis present

## 2021-08-01 LAB — GLUCOSE, CAPILLARY
Glucose-Capillary: 151 mg/dL — ABNORMAL HIGH (ref 70–99)
Glucose-Capillary: 158 mg/dL — ABNORMAL HIGH (ref 70–99)
Glucose-Capillary: 158 mg/dL — ABNORMAL HIGH (ref 70–99)
Glucose-Capillary: 210 mg/dL — ABNORMAL HIGH (ref 70–99)

## 2021-08-01 MED ORDER — DOFETILIDE 250 MCG PO CAPS: 250.0000 ug | ORAL_CAPSULE | Freq: Two times a day (BID) | ORAL | Status: DC

## 2021-08-01 MED ORDER — POTASSIUM CHLORIDE CRYS ER 20 MEQ PO TBCR: 20.0000 meq | EXTENDED_RELEASE_TABLET | Freq: Every day | ORAL | 1 refills | Status: AC

## 2021-08-01 MED ORDER — DIAZEPAM 2 MG PO TABS: 2.0000 mg | ORAL_TABLET | Freq: Every day | ORAL | 0 refills | Status: DC

## 2021-08-01 NOTE — Progress Notes (Signed)
OT Cancellation Note  Patient Details Name: VIKKI GAINS MRN: [id number] DOB: [birth year]   Cancelled Treatment:    Reason Eval/Treat Not Completed: Patient declined, no reason specified (Patient states she is leaving today and did not want to do any therapy) Lodema Hong, [place]  Pager [phone] Office [place] 08/01/2021, 1:57 PM

## 2021-08-01 NOTE — Discharge Summary (Signed)
Physician Discharge Summary   Melanie Lucas QIH:[id number] DOB: 1951-01-15 DOA: 07/24/2021  PCP: Melanie Loader, FNP  Admit date: 07/24/2021 Discharge date: 08/01/2021  Admitted From: Home Disposition:  SNF Discharging physician: Dwyane Dee, MD  Recommendations for Outpatient Follow-up:  Repeat BMP within 3-4 days. Adjust KCL if needed  Home Health:  Equipment/Devices:   Discharge Condition: stable CODE STATUS: Full Diet recommendation:  Diet Orders (From admission, onward)     Start     Ordered   08/01/21 0000  Diet - low sodium heart healthy        08/01/21 0822   08/01/21 0000  Diet Carb Modified        08/01/21 0822   07/30/21 0750  Diet heart healthy/carb modified Room service appropriate? No; Fluid consistency: Thin  Diet effective now       Question Answer Comment  Diet-HS Snack? Nothing   Room service appropriate? No   Fluid consistency: Thin      07/30/21 0749            Hospital Course: Melanie Lucas is a 71 y.o. female with pAF on Eliquis and dofetilide, recent stroke Jan 2023, CAD s/p PCI remote, DM, PVD carotid, HTN, dCHF and smoking who presented with headache, neck pain and confusion.  MRI brain showed acute/early subacute infarct involving left basal ganglia and left corona radiata.  Unchanged C5-7 ACDF with moderate right neural foraminal stenosis and mild spinal canal stenosis at C6-7 level.  Large area of myelomalacia within the cervical spinal cord at C5-6 levels, unchanged. Neurology was also consulted on admission. See below for further problem based plan.  Assessment and Plan: * Acute ischemic stroke ([place]) - MRI shows acute and subacute infarcts in L BG and corona radiata.  Non-invasive angiography shows no severe intracranial stenoses -Continue Eliquis -Continue Zetia (statin intolerant) - Evaluated by neurology, appreciate assistance -Evaluated by PT, plan will be for SNF at discharge  Paroxysmal atrial fibrillation (HCC) - Developed RVR  after admission and was started on Cardizem drip - Continue Tikosyn - Continue Eliquis -Suspect some tachycardia is induced by underlying anxiety/agitation especially from being in restraints.  Resume on low-dose benzo for now for further agitation - patient evaluated by EP cardiology and underwent reloading of Tikosyn due to missed doses; plan to continue Tikosyn 250 mg BID at discharge due to renal function (prior home dose noted was 500 mg BID) - continued on KCL at discharge, see CHF; needs intermittent BMP check  Acute on chronic diastolic CHF (congestive heart failure) (HCC) EF 55-60% in Jan 2023, grade 1 DD and normal valves at that time. -Patient has chronic lower extremity lymphedema at baseline - average of 30 mEq KCL given daily; will resume lasix at discharge also - continue with KCL 20 mEq daily and recheck BMP for stability  Acute metabolic encephalopathy - some probable underlying degree of cognitive impairment per discussion with daughter - s/p intermittent delirium - no longer requiring sitters or restraints  Acquired thrombophilia (HCC) - Continue Eliquis  Nicotine dependence, cigarettes, uncomplicated - Nicotine patch  COPD (chronic obstructive pulmonary disease) (HCC) No active flare, not on inhlaers at home  Type 2 diabetes mellitus without complication, without long-term current use of insulin (HCC) A1c 7.3% - resume home regimen  Mixed diabetic hyperlipidemia associated with type 2 diabetes mellitus (HCC) LDL 91 -Statin intolerant.  Continue Zetia  Pulmonary hypertension ([place]) See above  Coronary artery disease involving native coronary artery of native heart without angina pectoris  Statin intolerant -Continue Eliquis, Zetia -continue losartan  Essential hypertension, benign - Continue losartan  Neck pain-resolved as of 08/01/2021 Patient concerned about "cracking" in her neck after a recent fall.  MRI C-spine unremarkable.  Myocardial  injury-resolved as of 08/01/2021 Troponin leak due to rapid afib.  Myocardial infarction ruled out, no further ischemic work up at this time.      Principal Diagnosis: Acute ischemic stroke [facility])  Discharge Diagnoses: Principal Problem:   Acute ischemic stroke [facility]) Active Problems:   Paroxysmal atrial fibrillation (HCC)   Acute on chronic diastolic CHF (congestive heart failure) (HCC)   Acute metabolic encephalopathy   Essential hypertension, benign   Coronary artery disease involving native coronary artery of native heart without angina pectoris   Pulmonary hypertension (HCC)   Mixed diabetic hyperlipidemia associated with type 2 diabetes mellitus (HCC)   Type 2 diabetes mellitus without complication, without long-term current use of insulin (HCC)   COPD (chronic obstructive pulmonary disease) (HCC)   Nicotine dependence, cigarettes, uncomplicated   Acquired thrombophilia ([place])   Discharge Instructions     Diet - low sodium heart healthy   Complete by: As directed    Diet Carb Modified   Complete by: As directed    Increase activity slowly   Complete by: As directed       Allergies as of 08/01/2021       Reactions   Alendronate Sodium    Other reaction(s): pain all over   Avelox [moxifloxacin]    Unknown reaction    Fosamax [alendronate]    Pain all over   Pradaxa [dabigatran Etexilate Mesylate]    Extreme bleeding   Statins Nausea And Vomiting, Other (See Comments)   Muscle pain, Dizziness (intolerance)   Sulfa Antibiotics    Other reaction(s): vomiting   Sulfamethoxazole Nausea And Vomiting   Dizziness (intolerance)   Sulfonamide Derivatives Hives   Nausea vertigo   Varenicline    nightmares Other reaction(s): nightmares   Ciprofloxacin Rash   Other reaction(s): unknown   Gabapentin Rash   burning   Tape Itching, Rash   Please use "paper" tape only        Medication List     STOP taking these medications    nicotine 14 mg/24hr  patch Commonly known as: NICODERM CQ - dosed in mg/24 hours       TAKE these medications    acetaminophen 500 MG tablet Commonly known as: TYLENOL Take 1,000 mg by mouth every 6 (six) hours as needed for mild pain or headache.   diazepam 2 MG tablet Commonly known as: VALIUM Take 2 mg by mouth daily.   dofetilide 250 MCG capsule Commonly known as: TIKOSYN Take 1 capsule (250 mcg total) by mouth 2 (two) times daily. What changed:  medication strength how much to take when to take this additional instructions   DULoxetine HCl 30 MG Csdr Take 30 mg by mouth daily.   Eliquis 5 MG Tabs tablet Generic drug: apixaban TAKE 1 TABLET BY MOUTH TWICE A DAY What changed: how much to take   Empagliflozin-metFORMIN HCl 12.06-998 MG Tabs Take 1 tablet by mouth 2 (two) times daily.   furosemide 20 MG tablet Commonly known as: LASIX Take 20 mg by mouth daily.   linaclotide 290 MCG Caps capsule Commonly known as: LINZESS Take 290 mcg by mouth daily before breakfast.   losartan 50 MG tablet Commonly known as: COZAAR TAKE 1 TABLET BY MOUTH EVERY DAY   mirabegron ER 50 MG Tb24  tablet Commonly known as: MYRBETRIQ Take 50 mg by mouth daily.   multivitamin tablet Take 1 tablet by mouth at bedtime.   OVER THE COUNTER MEDICATION Take 15 mLs by mouth daily. SEAMOSS gel   potassium chloride SA 20 MEQ tablet Commonly known as: KLOR-CON M Take 1 tablet (20 mEq total) by mouth daily.   repaglinide 2 MG tablet Commonly known as: PRANDIN Take 2 mg by mouth in the morning and at bedtime.        Allergies  Allergen Reactions   Alendronate Sodium     Other reaction(s): pain all over   Avelox [Moxifloxacin]     Unknown reaction    Fosamax [Alendronate]     Pain all over   Pradaxa [Dabigatran Etexilate Mesylate]     Extreme bleeding   Statins Nausea And Vomiting and Other (See Comments)    Muscle pain, Dizziness (intolerance)   Sulfa Antibiotics     Other reaction(s):  vomiting   Sulfamethoxazole Nausea And Vomiting    Dizziness (intolerance)   Sulfonamide Derivatives Hives    Nausea vertigo   Varenicline     nightmares Other reaction(s): nightmares   Ciprofloxacin Rash    Other reaction(s): unknown   Gabapentin Rash    burning   Tape Itching and Rash    Please use "paper" tape only    Consultations: Neurology Cardiology  Procedures:   Discharge Exam: BP 129/75 (BP Location: Left Arm)   Pulse 65   Temp 97.8 F (36.6 C) (Oral)   Resp 16   Ht '5\' 7"'$  (1.702 m)   Wt 66.2 kg   SpO2 98%   BMI 22.86 kg/m  Physical Exam Constitutional:      General: She is not in acute distress. HENT:     Head: Normocephalic and atraumatic.     Mouth/Throat:     Mouth: Mucous membranes are moist.  Eyes:     Extraocular Movements: Extraocular movements intact.  Cardiovascular:     Rate and Rhythm: Normal rate. Rhythm irregular.  Pulmonary:     Effort: Pulmonary effort is normal.     Breath sounds: Normal breath sounds.  Abdominal:     General: Bowel sounds are normal. There is no distension.     Palpations: Abdomen is soft.     Tenderness: There is no abdominal tenderness.  Musculoskeletal:        General: Normal range of motion.     Cervical back: Normal range of motion and neck supple.  Skin:    General: Skin is warm and dry.  Neurological:     Comments: Follows commands and moves all 4 extremities      The results of significant diagnostics from this hospitalization (including imaging, microbiology, ancillary and laboratory) are listed below for reference.   Microbiology: No results found for this or any previous visit (from the past 240 hour(s)).   Labs: BNP (last 3 results) Recent Labs    01/27/21 1408 03/07/21 1146  BNP 189.4* 295.2*   Basic Metabolic Panel: Recent Labs  Lab 07/26/21 0549 07/30/21 0019 07/30/21 1050 07/30/21 1726 07/31/21 0131  NA 138 139 138 139 138  K 3.8 3.3* 3.5 4.4 4.2  CL 106 106 105 106 107   CO2 '22 24 24 23 24  '$ GLUCOSE 111* 197* 213* 189* 117*  BUN 18 32* 31* 31* 24*  CREATININE 0.67 0.60 0.74 1.04* 0.73  CALCIUM 8.9 9.1 8.7* 8.4* 8.5*  MG  --  2.1 1.9 2.2 2.1  Liver Function Tests: Recent Labs  Lab 07/26/21 0549  AST 19  ALT 19  ALKPHOS 68  BILITOT 1.3*  PROT 5.8*  ALBUMIN 3.3*   No results for input(s): "LIPASE", "AMYLASE" in the last 168 hours. No results for input(s): "AMMONIA" in the last 168 hours. CBC: Recent Labs  Lab 07/26/21 0549  WBC 8.9  HGB 16.6*  HCT 46.9*  MCV 90.7  PLT 235   Cardiac Enzymes: No results for input(s): "CKTOTAL", "CKMB", "CKMBINDEX", "TROPONINI" in the last 168 hours. BNP: Invalid input(s): "POCBNP" CBG: Recent Labs  Lab 07/31/21 0819 07/31/21 1145 07/31/21 1540 07/31/21 2110 08/01/21 0814  GLUCAP 132* 194* 247* 156* 151*   D-Dimer No results for input(s): "DDIMER" in the last 72 hours. Hgb A1c No results for input(s): "HGBA1C" in the last 72 hours. Lipid Profile No results for input(s): "CHOL", "HDL", "LDLCALC", "TRIG", "CHOLHDL", "LDLDIRECT" in the last 72 hours. Thyroid function studies No results for input(s): "TSH", "T4TOTAL", "T3FREE", "THYROIDAB" in the last 72 hours.  Invalid input(s): "FREET3" Anemia work up No results for input(s): "VITAMINB12", "FOLATE", "FERRITIN", "TIBC", "IRON", "RETICCTPCT" in the last 72 hours. Urinalysis    Component Value Date/Time   COLORURINE YELLOW 07/24/2021 1238   APPEARANCEUR CLEAR 07/24/2021 1238   LABSPEC 1.036 (H) 07/24/2021 1238   PHURINE 6.5 07/24/2021 1238   GLUCOSEU >1,000 (A) 07/24/2021 1238   HGBUR NEGATIVE 07/24/2021 1238   BILIRUBINUR NEGATIVE 07/24/2021 1238   BILIRUBINUR negative 10/17/2015 1839   KETONESUR NEGATIVE 07/24/2021 1238   PROTEINUR NEGATIVE 07/24/2021 1238   UROBILINOGEN 0.2 10/17/2015 1839   UROBILINOGEN 1.0 09/20/2010 0025   NITRITE NEGATIVE 07/24/2021 1238   LEUKOCYTESUR NEGATIVE 07/24/2021 1238   Sepsis Labs Recent Labs  Lab  07/26/21 0549  WBC 8.9   Microbiology No results found for this or any previous visit (from the past 240 hour(s)).  Procedures/Studies: DG CHEST PORT 1 VIEW  Result Date: 07/25/2021 CLINICAL DATA:  443154, shortness of breath, tachycardia. History of COPD, diabetes, hypertension and stroke EXAM: PORTABLE CHEST 1 VIEW COMPARISON:  July 24, 2021 FINDINGS: Mild cardiomegaly. Stable atheromatous calcifications at the arch of the aorta. Both lungs are clear without evidence of consolidation, vascular congestion, pleural effusion or pneumothorax. Post cervical fusion changes in the visualized cervical spine. IMPRESSION: No active disease.  Mild cardiomegaly. Electronically Signed   By: Frazier Richards M.D.   On: 07/25/2021 10:37   Overnight EEG with video  Result Date: 07/25/2021 Lora Havens, MD     07/25/2021  5:52 PM Patient Name: Melanie Lucas MRN: [id number] Epilepsy Attending: Lora Havens Referring Physician/Provider: Regan Lemming, MD Duration: 07/25/2021 0932 to 1424  Patient history:  71 y.o. female who presents with occipital and neck pain since fall on Saturday along with intermittent episodes of confusion and aphasia. EEG to evaluate for seizure  Level of alertness: Awake, asleep  AEDs during EEG study: None  Technical aspects: This EEG study was done with scalp electrodes positioned according to the 10-20 International system of electrode placement. Electrical activity was acquired at a sampling rate of '500Hz'$  and reviewed with a high frequency filter of '70Hz'$  and a low frequency filter of '1Hz'$ . EEG data were recorded continuously and digitally stored.  Description: The posterior dominant rhythm consists of 8 Hz activity of moderate voltage (25-35 uV) seen predominantly in posterior head regions, symmetric and reactive to eye opening and eye closing.  EEG showed intermittent generalized and lateralized left hemisphere 3 to 6 Hz theta and delta  slowing.   ABNORMALITY - Intermittent slow, generalized  and lateralized left hemisphere  IMPRESSION: This study is suggestive of cortical dysfunction in left hemisphere which is nonspecific but could be secondary to underlying stroke.  Additionally there is mild diffuse encephalopathy, nonspecific etiology.  No seizures or epileptiform discharges were seen throughout the recording.  Lora Havens    EEG adult  Result Date: 07/25/2021 Lora Havens, MD     07/25/2021  8:59 AM Patient Name: Melanie Lucas MRN: [id number] Epilepsy Attending: Lora Havens Referring Physician/Provider: Regan Lemming, MD Date: 07/25/2021 Duration: 25.23 mins Patient history:  71 y.o. female who presents with occipital and neck pain since fall on Saturday along with intermittent episodes of confusion and aphasia. EEG to evaluate for seizure Level of alertness: Awake AEDs during EEG study: None Technical aspects: This EEG study was done with scalp electrodes positioned according to the 10-20 International system of electrode placement. Electrical activity was acquired at a sampling rate of '500Hz'$  and reviewed with a high frequency filter of '70Hz'$  and a low frequency filter of '1Hz'$ . EEG data were recorded continuously and digitally stored. Description: The posterior dominant rhythm consists of 8 Hz activity of moderate voltage (25-35 uV) seen predominantly in posterior head regions, symmetric and reactive to eye opening and eye closing.  EEG showed intermittent generalized and lateralized left hemisphere 3 to 6 Hz theta and delta slowing.  Physiologic photic driving was not seen during photic stimulation.  Hyperventilation was not performed.   ABNORMALITY - Intermittent slow, generalized and lateralized left hemisphere IMPRESSION: This study is suggestive of cortical dysfunction in left hemisphere which is nonspecific but could be secondary to underlying stroke.  Additionally there is mild diffuse encephalopathy, nonspecific etiology.  No seizures or epileptiform discharges were seen  throughout the recording. Lora Havens   MR BRAIN WO CONTRAST  Result Date: 07/25/2021 CLINICAL DATA:  Altered mental status EXAM: MRI HEAD WITHOUT CONTRAST MRI CERVICAL SPINE WITHOUT CONTRAST TECHNIQUE: Multiplanar, multiecho pulse sequences of the brain and surrounding structures, and cervical spine, to include the craniocervical junction and cervicothoracic junction, were obtained without intravenous contrast. COMPARISON:  Cervical spine MRI 01/21/2018 Brain MRI 03/07/2021 FINDINGS: MRI HEAD FINDINGS Brain: Acute/early subacute infarcts within the left basal ganglia and left corona radiata. No acute or chronic hemorrhage. There is confluent hyperintense T2-weighted signal within the white matter. There is advanced atrophy. The midline structures are normal. Vascular: Major flow voids are preserved. Skull and upper cervical spine: Normal calvarium and skull base. Visualized upper cervical spine and soft tissues are normal. Sinuses/Orbits:No paranasal sinus fluid levels or advanced mucosal thickening. No mastoid or middle ear effusion. Normal orbits. MRI CERVICAL SPINE FINDINGS Alignment: Physiologic. Vertebrae: C5-7 ACDF Cord: Large area of myelomalacia within the cervical spinal cord at the C5-6 levels, unchanged. No acute cord lesion. Posterior Fossa, vertebral arteries, paraspinal tissues: Negative Disc levels: C1-2: Unremarkable. C2-3: Normal disc space and facet joints. There is no spinal canal stenosis. No neural foraminal stenosis. C3-4: Normal disc space and facet joints. There is no spinal canal stenosis. No neural foraminal stenosis. C4-5: Right facet hypertrophy with small right uncovertebral spur. No spinal canal stenosis. Mild right neural foraminal stenosis. C5-6: ACDF with right uncovertebral spurring, unchanged. There is no spinal canal stenosis. Moderate right neural foraminal stenosis. C6-7: ACDF with large right uncovertebral spur, unchanged. Unchanged mild spinal canal stenosis.  Unchanged moderate right neural foraminal stenosis. C7-T1: Normal disc space and facet joints. There is no spinal canal stenosis.  No neural foraminal stenosis. IMPRESSION: 1. Acute/early subacute infarcts within the left basal ganglia and left corona radiata. No hemorrhage or mass effect. 2. Unchanged C5-7 ACDF with moderate right neural foraminal stenosis and mild spinal canal stenosis at the C6-7 level. 3. Large area of myelomalacia within the cervical spinal cord at the C5-6 levels, unchanged. Electronically Signed   By: Ulyses Jarred M.D.   On: 07/25/2021 03:17   MR CERVICAL SPINE WO CONTRAST  Result Date: 07/25/2021 CLINICAL DATA:  Altered mental status EXAM: MRI HEAD WITHOUT CONTRAST MRI CERVICAL SPINE WITHOUT CONTRAST TECHNIQUE: Multiplanar, multiecho pulse sequences of the brain and surrounding structures, and cervical spine, to include the craniocervical junction and cervicothoracic junction, were obtained without intravenous contrast. COMPARISON:  Cervical spine MRI 01/21/2018 Brain MRI 03/07/2021 FINDINGS: MRI HEAD FINDINGS Brain: Acute/early subacute infarcts within the left basal ganglia and left corona radiata. No acute or chronic hemorrhage. There is confluent hyperintense T2-weighted signal within the white matter. There is advanced atrophy. The midline structures are normal. Vascular: Major flow voids are preserved. Skull and upper cervical spine: Normal calvarium and skull base. Visualized upper cervical spine and soft tissues are normal. Sinuses/Orbits:No paranasal sinus fluid levels or advanced mucosal thickening. No mastoid or middle ear effusion. Normal orbits. MRI CERVICAL SPINE FINDINGS Alignment: Physiologic. Vertebrae: C5-7 ACDF Cord: Large area of myelomalacia within the cervical spinal cord at the C5-6 levels, unchanged. No acute cord lesion. Posterior Fossa, vertebral arteries, paraspinal tissues: Negative Disc levels: C1-2: Unremarkable. C2-3: Normal disc space and facet joints.  There is no spinal canal stenosis. No neural foraminal stenosis. C3-4: Normal disc space and facet joints. There is no spinal canal stenosis. No neural foraminal stenosis. C4-5: Right facet hypertrophy with small right uncovertebral spur. No spinal canal stenosis. Mild right neural foraminal stenosis. C5-6: ACDF with right uncovertebral spurring, unchanged. There is no spinal canal stenosis. Moderate right neural foraminal stenosis. C6-7: ACDF with large right uncovertebral spur, unchanged. Unchanged mild spinal canal stenosis. Unchanged moderate right neural foraminal stenosis. C7-T1: Normal disc space and facet joints. There is no spinal canal stenosis. No neural foraminal stenosis. IMPRESSION: 1. Acute/early subacute infarcts within the left basal ganglia and left corona radiata. No hemorrhage or mass effect. 2. Unchanged C5-7 ACDF with moderate right neural foraminal stenosis and mild spinal canal stenosis at the C6-7 level. 3. Large area of myelomalacia within the cervical spinal cord at the C5-6 levels, unchanged. Electronically Signed   By: Ulyses Jarred M.D.   On: 07/25/2021 03:17   CT ANGIO HEAD NECK W WO CM  Result Date: 07/24/2021 CLINICAL DATA:  Speech disturbance.  Confusion.  Dizziness. EXAM: CT ANGIOGRAPHY HEAD AND NECK TECHNIQUE: Multidetector CT imaging of the head and neck was performed using the standard protocol during bolus administration of intravenous contrast. Multiplanar CT image reconstructions and MIPs were obtained to evaluate the vascular anatomy. Carotid stenosis measurements (when applicable) are obtained utilizing NASCET criteria, using the distal internal carotid diameter as the denominator. RADIATION DOSE REDUCTION: This exam was performed according to the departmental dose-optimization program which includes automated exposure control, adjustment of the mA and/or kV according to patient size and/or use of iterative reconstruction technique. CONTRAST:  65m OMNIPAQUE IOHEXOL 350  MG/ML SOLN COMPARISON:  03/08/2021 FINDINGS: CT HEAD FINDINGS Brain: Generalized atrophy. Chronic ischemic changes of the pons. Extensive chronic ischemic changes of the cerebral hemispheric white matter. No identifiable acute infarction, mass lesion, hemorrhage, hydrocephalus or extra-axial collection. Vascular: There is atherosclerotic calcification of the major vessels  at the base of the brain. Skull: Negative Sinuses: Clear Orbits: Normal Review of the MIP images confirms the above findings CTA NECK FINDINGS Aortic arch: Aortic atherosclerosis. Branching pattern is normal without origin stenosis. Right carotid system: Common carotid artery widely patent to the bifurcation. Carotid bifurcation shows calcified plaque. Minimal diameter of the proximal ICA is 3.5 mm. Compared to a more distal cervical ICA diameter of 5 mm, this indicates a 30% stenosis. Left carotid system: Common carotid artery widely patent to the bifurcation. Carotid bifurcation is widely patent. Possible previous endarterectomy. Stenosis of the distal bulb with minimal diameter of 3 mm. Compared to a more distal cervical ICA diameter of 5 mm, this indicates a 40% stenosis. Vertebral arteries: Both vertebral artery origins are widely patent. Both vertebral arteries appear normal through the cervical region to the foramen magnum. Skeleton: Chronic degenerative spondylosis.  Distant ACDF C5-C7. Other neck: No mass or lymphadenopathy. Upper chest: Chronic interstitial lung markings.  No acute finding. Review of the MIP images confirms the above findings CTA HEAD FINDINGS Anterior circulation: Both internal carotid arteries are patent through the skull base and siphon regions. There is siphon atherosclerotic calcification with stenosis estimated at 50% on both sides. The anterior and middle cerebral vessels are patent. No large vessel occlusion or flow limiting proximal stenosis. No aneurysm or vascular malformation. More distal branch vessels show  atherosclerotic irregularity. Posterior circulation: Both vertebral arteries are patent to the basilar. No basilar stenosis. Posterior circulation branch vessels are patent. Some atherosclerotic irregularity of the more distal PCA branches. Venous sinuses: Patent and normal. Anatomic variants: None significant. Review of the MIP images confirms the above findings IMPRESSION: Advanced chronic small-vessel ischemic changes affecting the brainstem and cerebral hemispheric white matter. No acute CT finding. Advanced aortic atherosclerosis. Atherosclerosis at both carotid bifurcations. 30% stenosis of the proximal ICA on the right. 40% stenosis of the distal bulb region on the left just distal to the endarterectomy segment. Atherosclerotic disease in both carotid siphon regions with stenosis estimated at 50% on both sides. No intracranial large vessel occlusion or flow limiting proximal branch vessel stenosis. Distal vessel atherosclerotic irregularity. Electronically Signed   By: Nelson Chimes M.D.   On: 07/24/2021 16:48   DG Chest Port 1 View  Result Date: 07/24/2021 CLINICAL DATA:  Throat and neck pain. EXAM: PORTABLE CHEST 1 VIEW COMPARISON:  Radiograph 03/06/2021 FINDINGS: Anterior cervical fusion. Normal mediastinum and cardiac silhouette. Normal pulmonary vasculature. No evidence of effusion, infiltrate, or pneumothorax. No acute bony abnormality. IMPRESSION: 1. No acute cardiopulmonary findings. 2. Anterior cervical fusion. Electronically Signed   By: Suzy Bouchard M.D.   On: 07/24/2021 13:51     Time coordinating discharge: Over 85 minutes    Dwyane Dee, MD  [facility] 08/01/2021, 8:31 AM

## 2021-08-01 NOTE — TOC Progression Note (Addendum)
Transition of Care [facility]) - Progression Note    Patient Details  Name: Melanie Lucas MRN: [id number] Date of Birth: Jul 04, 1950  Transition of Care [facility]) CM/SW [place], [place] Phone Number: 08/01/2021, 8:37 AM  Clinical Narrative:     Patients insurance authorization has been approved. Plan Auth ID# [id number] [place] ID# [id number]. Insurance authorization approved from 6/13-6/15. Patient has SNF bed at [facility]. CSW informed MD. CSW will continue to follow and assist with patients dc planning needs.   Expected Discharge Plan: [place] Barriers to Discharge: Continued Medical Work up, Ship broker  Expected Discharge Plan and Services Expected Discharge Plan: [place] Choice: [place] arrangements for the past 2 months: Single Family Home Expected Discharge Date: 08/01/21                                     Social Determinants of Health (SDOH) Interventions    Readmission Risk Interventions     No data to display

## 2021-08-01 NOTE — TOC Transition Note (Signed)
Transition of Care [facility]) - CM/SW Discharge Note   Patient Details  Name: Melanie Lucas MRN: [id number] Date of Birth: [birth year]  Transition of Care [facility]) CM/SW Contact:  Milas Gain, [place] Phone Number: 08/01/2021, 10:17 AM   Clinical Narrative:     Patient will DC to: [facility]  Anticipated DC date: 08/01/2021  Family notified: [name]   Transport by: Corey Harold  ?  Per MD patient ready for DC to [facility] . RN, patient, patient's family, and facility notified of DC. Discharge Summary sent to facility. RN given number for report tele#[phone] RM#[phone]. DC packet on chart. Ambulance transport requested for patient.  CSW signing off.   Final next level of care: Skilled Nursing Facility Barriers to Discharge: No Barriers Identified   Patient Goals and CMS Choice Patient states their goals for this hospitalization and ongoing recovery are:: SNF CMS Medicare.gov Compare Post Acute Care list provided to:: Patient Represenative (must comment) ([name]) Choice offered to / list presented to : Adult Children ([name])  Discharge Placement              Patient chooses bed at: [facility] Patient to be transferred to facility by: [place] Name of family member notified: [name] Patient and family notified of of transfer: 08/01/21  Discharge Plan and Services     Post Acute Care Choice: [place]                               Social Determinants of Health (SDOH) Interventions     Readmission Risk Interventions     No data to display

## 2021-08-01 NOTE — Progress Notes (Signed)
Report called to [place], receiving nurse at [facility]. Patient is awaiting transport via [facility] at this time.

## 2021-08-01 NOTE — Progress Notes (Signed)
PT Cancellation Note  Patient Details Name: Melanie Lucas MRN: [id number] DOB: 1950/10/27   Cancelled Treatment:    Reason Eval/Treat Not Completed: Patient declined, no reason specified;Other (comment), attempted x2, AM-pt eating breakfast asking this PTA to come back, PM-pt stating she'd "just assume rest" despite encouragement. Will check back as schedule allows to continue with PT POC.    Betsey Holiday Debora [place] 08/01/2021, 12:08 PM

## 2021-08-02 DIAGNOSIS — I480 Paroxysmal atrial fibrillation: Secondary | ICD-10-CM | POA: Diagnosis not present

## 2021-08-02 DIAGNOSIS — I6340 Cerebral infarction due to embolism of unspecified cerebral artery: Secondary | ICD-10-CM | POA: Diagnosis not present

## 2021-08-02 DIAGNOSIS — I10 Essential (primary) hypertension: Secondary | ICD-10-CM | POA: Diagnosis not present

## 2021-08-02 DIAGNOSIS — E118 Type 2 diabetes mellitus with unspecified complications: Secondary | ICD-10-CM | POA: Diagnosis not present

## 2021-08-02 DIAGNOSIS — I890 Lymphedema, not elsewhere classified: Secondary | ICD-10-CM | POA: Diagnosis not present

## 2021-08-02 DIAGNOSIS — I2510 Atherosclerotic heart disease of native coronary artery without angina pectoris: Secondary | ICD-10-CM | POA: Diagnosis not present

## 2021-08-02 DIAGNOSIS — M6281 Muscle weakness (generalized): Secondary | ICD-10-CM | POA: Diagnosis not present

## 2021-08-02 DIAGNOSIS — J441 Chronic obstructive pulmonary disease with (acute) exacerbation: Secondary | ICD-10-CM | POA: Diagnosis not present

## 2021-08-02 DIAGNOSIS — I5032 Chronic diastolic (congestive) heart failure: Secondary | ICD-10-CM | POA: Diagnosis not present

## 2021-08-04 DIAGNOSIS — I890 Lymphedema, not elsewhere classified: Secondary | ICD-10-CM | POA: Diagnosis not present

## 2021-08-04 DIAGNOSIS — R2681 Unsteadiness on feet: Secondary | ICD-10-CM | POA: Diagnosis not present

## 2021-08-04 DIAGNOSIS — I5032 Chronic diastolic (congestive) heart failure: Secondary | ICD-10-CM | POA: Diagnosis not present

## 2021-08-04 DIAGNOSIS — I639 Cerebral infarction, unspecified: Secondary | ICD-10-CM | POA: Diagnosis not present

## 2021-08-04 DIAGNOSIS — J441 Chronic obstructive pulmonary disease with (acute) exacerbation: Secondary | ICD-10-CM | POA: Diagnosis not present

## 2021-08-04 DIAGNOSIS — I10 Essential (primary) hypertension: Secondary | ICD-10-CM | POA: Diagnosis not present

## 2021-08-04 DIAGNOSIS — I2510 Atherosclerotic heart disease of native coronary artery without angina pectoris: Secondary | ICD-10-CM | POA: Diagnosis not present

## 2021-08-04 DIAGNOSIS — I6340 Cerebral infarction due to embolism of unspecified cerebral artery: Secondary | ICD-10-CM | POA: Diagnosis not present

## 2021-08-04 DIAGNOSIS — I480 Paroxysmal atrial fibrillation: Secondary | ICD-10-CM | POA: Diagnosis not present

## 2021-08-04 DIAGNOSIS — I5033 Acute on chronic diastolic (congestive) heart failure: Secondary | ICD-10-CM | POA: Diagnosis not present

## 2021-08-04 DIAGNOSIS — E118 Type 2 diabetes mellitus with unspecified complications: Secondary | ICD-10-CM | POA: Diagnosis not present

## 2021-08-04 DIAGNOSIS — J449 Chronic obstructive pulmonary disease, unspecified: Secondary | ICD-10-CM | POA: Diagnosis not present

## 2021-08-04 DIAGNOSIS — M6281 Muscle weakness (generalized): Secondary | ICD-10-CM | POA: Diagnosis not present

## 2021-08-04 DIAGNOSIS — E119 Type 2 diabetes mellitus without complications: Secondary | ICD-10-CM | POA: Diagnosis not present

## 2021-08-07 DIAGNOSIS — I890 Lymphedema, not elsewhere classified: Secondary | ICD-10-CM | POA: Diagnosis not present

## 2021-08-07 DIAGNOSIS — I6340 Cerebral infarction due to embolism of unspecified cerebral artery: Secondary | ICD-10-CM | POA: Diagnosis not present

## 2021-08-07 DIAGNOSIS — I2510 Atherosclerotic heart disease of native coronary artery without angina pectoris: Secondary | ICD-10-CM | POA: Diagnosis not present

## 2021-08-07 DIAGNOSIS — J441 Chronic obstructive pulmonary disease with (acute) exacerbation: Secondary | ICD-10-CM | POA: Diagnosis not present

## 2021-08-07 DIAGNOSIS — I10 Essential (primary) hypertension: Secondary | ICD-10-CM | POA: Diagnosis not present

## 2021-08-07 DIAGNOSIS — I5032 Chronic diastolic (congestive) heart failure: Secondary | ICD-10-CM | POA: Diagnosis not present

## 2021-08-07 DIAGNOSIS — E118 Type 2 diabetes mellitus with unspecified complications: Secondary | ICD-10-CM | POA: Diagnosis not present

## 2021-08-07 DIAGNOSIS — M6281 Muscle weakness (generalized): Secondary | ICD-10-CM | POA: Diagnosis not present

## 2021-08-07 DIAGNOSIS — I480 Paroxysmal atrial fibrillation: Secondary | ICD-10-CM | POA: Diagnosis not present

## 2021-08-08 DIAGNOSIS — I639 Cerebral infarction, unspecified: Secondary | ICD-10-CM | POA: Diagnosis not present

## 2021-08-08 DIAGNOSIS — R2681 Unsteadiness on feet: Secondary | ICD-10-CM | POA: Diagnosis not present

## 2021-08-08 DIAGNOSIS — I2510 Atherosclerotic heart disease of native coronary artery without angina pectoris: Secondary | ICD-10-CM | POA: Diagnosis not present

## 2021-08-08 DIAGNOSIS — I69898 Other sequelae of other cerebrovascular disease: Secondary | ICD-10-CM | POA: Diagnosis not present

## 2021-08-08 DIAGNOSIS — I5032 Chronic diastolic (congestive) heart failure: Secondary | ICD-10-CM | POA: Diagnosis not present

## 2021-08-08 DIAGNOSIS — I5033 Acute on chronic diastolic (congestive) heart failure: Secondary | ICD-10-CM | POA: Diagnosis not present

## 2021-08-08 DIAGNOSIS — E119 Type 2 diabetes mellitus without complications: Secondary | ICD-10-CM | POA: Diagnosis not present

## 2021-08-08 DIAGNOSIS — E118 Type 2 diabetes mellitus with unspecified complications: Secondary | ICD-10-CM | POA: Diagnosis not present

## 2021-08-08 DIAGNOSIS — I10 Essential (primary) hypertension: Secondary | ICD-10-CM | POA: Diagnosis not present

## 2021-08-08 DIAGNOSIS — M6281 Muscle weakness (generalized): Secondary | ICD-10-CM | POA: Diagnosis not present

## 2021-08-08 DIAGNOSIS — I480 Paroxysmal atrial fibrillation: Secondary | ICD-10-CM | POA: Diagnosis not present

## 2021-08-08 DIAGNOSIS — J449 Chronic obstructive pulmonary disease, unspecified: Secondary | ICD-10-CM | POA: Diagnosis not present

## 2021-08-08 DIAGNOSIS — I110 Hypertensive heart disease with heart failure: Secondary | ICD-10-CM | POA: Diagnosis not present

## 2021-08-09 ENCOUNTER — Encounter (HOSPITAL_COMMUNITY): Payer: Self-pay | Admitting: Nurse Practitioner

## 2021-08-09 ENCOUNTER — Ambulatory Visit (HOSPITAL_COMMUNITY)
Admission: RE | Admit: 2021-08-09 | Discharge: 2021-08-09 | Disposition: A | Discharge status: Home / Self Care | Payer: Medicare PPO | Source: Ambulatory Visit | Attending: Nurse Practitioner | Admitting: Nurse Practitioner

## 2021-08-09 VITALS — BP 138/64 | HR 63 | Ht 67.0 in | Wt 138.2 lb

## 2021-08-09 DIAGNOSIS — F411 Generalized anxiety disorder: Secondary | ICD-10-CM | POA: Diagnosis not present

## 2021-08-09 DIAGNOSIS — E1169 Type 2 diabetes mellitus with other specified complication: Secondary | ICD-10-CM | POA: Diagnosis not present

## 2021-08-09 DIAGNOSIS — E782 Mixed hyperlipidemia: Secondary | ICD-10-CM

## 2021-08-09 DIAGNOSIS — I480 Paroxysmal atrial fibrillation: Secondary | ICD-10-CM | POA: Diagnosis not present

## 2021-08-09 DIAGNOSIS — Z7982 Long term (current) use of aspirin: Secondary | ICD-10-CM | POA: Diagnosis not present

## 2021-08-09 DIAGNOSIS — E119 Type 2 diabetes mellitus without complications: Secondary | ICD-10-CM | POA: Insufficient documentation

## 2021-08-09 DIAGNOSIS — I2510 Atherosclerotic heart disease of native coronary artery without angina pectoris: Secondary | ICD-10-CM | POA: Diagnosis not present

## 2021-08-09 DIAGNOSIS — I10 Essential (primary) hypertension: Secondary | ICD-10-CM | POA: Insufficient documentation

## 2021-08-09 DIAGNOSIS — Z8249 Family history of ischemic heart disease and other diseases of the circulatory system: Secondary | ICD-10-CM | POA: Insufficient documentation

## 2021-08-09 DIAGNOSIS — Z79899 Other long term (current) drug therapy: Secondary | ICD-10-CM | POA: Insufficient documentation

## 2021-08-09 DIAGNOSIS — Z8673 Personal history of transient ischemic attack (TIA), and cerebral infarction without residual deficits: Secondary | ICD-10-CM | POA: Diagnosis not present

## 2021-08-09 DIAGNOSIS — I6340 Cerebral infarction due to embolism of unspecified cerebral artery: Secondary | ICD-10-CM | POA: Diagnosis not present

## 2021-08-09 DIAGNOSIS — E118 Type 2 diabetes mellitus with unspecified complications: Secondary | ICD-10-CM | POA: Diagnosis not present

## 2021-08-09 DIAGNOSIS — F17210 Nicotine dependence, cigarettes, uncomplicated: Secondary | ICD-10-CM | POA: Insufficient documentation

## 2021-08-09 DIAGNOSIS — I5032 Chronic diastolic (congestive) heart failure: Secondary | ICD-10-CM | POA: Diagnosis not present

## 2021-08-09 DIAGNOSIS — M6281 Muscle weakness (generalized): Secondary | ICD-10-CM | POA: Diagnosis not present

## 2021-08-09 DIAGNOSIS — J441 Chronic obstructive pulmonary disease with (acute) exacerbation: Secondary | ICD-10-CM | POA: Diagnosis not present

## 2021-08-09 DIAGNOSIS — D6869 Other thrombophilia: Secondary | ICD-10-CM

## 2021-08-09 DIAGNOSIS — Z7984 Long term (current) use of oral hypoglycemic drugs: Secondary | ICD-10-CM | POA: Diagnosis not present

## 2021-08-09 DIAGNOSIS — Z7901 Long term (current) use of anticoagulants: Secondary | ICD-10-CM | POA: Insufficient documentation

## 2021-08-09 DIAGNOSIS — I890 Lymphedema, not elsewhere classified: Secondary | ICD-10-CM | POA: Diagnosis not present

## 2021-08-09 LAB — MAGNESIUM: Magnesium: 1.8 mg/dL (ref 1.7–2.4)

## 2021-08-09 LAB — BASIC METABOLIC PANEL
Anion gap: 11 (ref 5–15)
BUN: 18 mg/dL (ref 8–23)
CO2: 29 mmol/L (ref 22–32)
Calcium: 9.5 mg/dL (ref 8.9–10.3)
Chloride: 100 mmol/L (ref 98–111)
Creatinine, Ser: 0.69 mg/dL (ref 0.44–1.00)
GFR, Estimated: >60 mL/min (ref >60)
Glucose, Bld: 190 mg/dL — ABNORMAL HIGH (ref 70–99)
Potassium: 4.2 mmol/L (ref 3.5–5.1)
Sodium: 140 mmol/L (ref 135–145)

## 2021-08-09 MED ORDER — DOFETILIDE 250 MCG PO CAPS: 250.0000 ug | ORAL_CAPSULE | Freq: Two times a day (BID) | ORAL | 3 refills | Status: AC

## 2021-08-09 NOTE — Progress Notes (Signed)
Patient ID: Melanie Lucas, female   DOB: [birth year], 71 y.o.   MRN: [id number]     Primary Care Physician: Kristen Loader, FNP Referring Physician: [facility] f/u EP: Dr. Bolivar Haw Melanie Lucas is a 71 y.o. female with a h/o PAF previously managed with dronedarone, lacunar infarct, HTN, DM, CAD, and tobacco abuse.  She was  Seen 11/09/15 and was noted to be back in atrial fibrillation with RVR. Decision was made to admit her for further evaluation and eliquis initiation followed by tikosyn loading.  Following admission, pt was placed on IV diltitazem and dronedarone was discontinued.  Eliquis was started with a plan to perfom TEE and cardioversion after she had received 5 doses of eliquis.  Rates were relatively stable and troponins were normal.  She underwent TEE on 9/23 revealing no evidence of LAA thrombus.  This was followed by DCCV using one synchronized shock at 200 joules with successful conversion to sinus bradycardia with intermittent junctional rhythm.  Following cardioversion, she was loaded with Tikosyn 500 mcg q12 hrs.  She  tolerated tikosyn loading well and has not had any further atrial fibrillation.  Her QTc  remained relatively stable.   F/u in afib clinic, 01/05/19, after seeing Dr. Rayann Heman virtually  in September.  I have not seen pt since 2017. She states that she has not noted any recent afib. Remains on dofetilide 500 mcg bid and eliquis 5 mg bid for chadsvasc score of at least  7.   F/u in afib clinic, 10/21/19. She remains on Tikosyn and is staying in [place]. She continues to smoke. She is still grieving the death of her husband from [place]  last February. She is in SR today with acceptable qt.   F/u in afib clinic today, 08/09/21, after reloaded on tikosyn with recent hospitalization 07/24/21 to 08/01/21. She was admitted with H/A, neck pain and confusion.   Brain MRI showed acute/early subacute infarct involving left basal ganglia and corona radiata. She has had prior strokes, last one prior  to this hospital stay was in January. She does smoke.   Throughout hospital stay, she had episodes of atrial fibrillation.  She has also had episodes of bradycardia.  She was put on a diltiazem drip, which was stopped due to bradycardia.  Her dofetilide was also held for multiple doses throughout her hospitalization.  Currently she feels well.  She has previously been well controlled on dofetilide.  She wanted  to restart the medication so she was reloaded on Tikosyn per Dr. Curt Bears. She is now in the afib clinic for surveillance of Tikosyn. EKG today shows SR. She is in rehab but expected to go home in the next few days. Her daughter states that she plans  to establish with a new cardiologist with [facility] in the next week. She is HOH.     Today, she denies symptoms of palpitations, chest pain, shortness of breath, orthopnea, PND, lower extremity edema, dizziness, presyncope, syncope, or neurologic sequela. The patient is tolerating medications without difficulties and is otherwise without complaint today.   Past Medical History:  Diagnosis Date   Anxiety    Cancer ([place])    Skin- leg    Chronic acquired lymphedema    a. R>L;  b. 03/2012 Neg LE U/S for DVT. Legs since age 17   Complication of anesthesia    woke up during colonoscopy, Lithrostrixpy, Biospy   COPD (chronic obstructive pulmonary disease) (HCC)    Coronary artery disease  CVD (cardiovascular disease)    Deaf, right    Diabetes mellitus    Type II   Diabetic neuropathy ([place])    Dysrhythmia    afib   Elevated TSH    a. 10/2012 - inst to f/u PCP.   Full dentures    Hematuria    a. while on pradaxa,  she reports that she has seen Dr Margie Ege and had low risk cystoscopy   History of kidney stones    Hyperlipidemia    Hypertension    patient denies   Incontinence    prior to urinating   Lacunar infarction [facility])    a. 02/2009 non-acute Lacunar infarct of the right thalamus noted on MRI of brain.   Left carotid stenosis     Lymphedema of extremity    had this problem, since she was a teenager. especially seen in R LE   Meniere disease    Myocardial infarction [facility])    Obesity    Osteoarthritis    cervical & lumbar region, knees, hands cramp also    PAF with post-termination pauses    a. on dronedarone;  b. CHA2DS2VASc = 5 (HTN, DM, h/o lacunar infarct on MRI, Female) ->refused oral anticoagulation after h/o hematuria on pradaxa;  c. 02/2012 Echo: EF 55-60%, mildly dil LA. D. Recurrent PAF 10/2012 after only taking Multaq 1x/day - spont conv to NSR, placed back on BID Multaq/eliquis;  e. 10/2014 Multaq d/c'd->tikosyn initiated.   Shortness of breath dyspnea    with exertion   Stroke [facility])    2 mini strokes   Tobacco abuse    Vision abnormalities    Past Surgical History:  Procedure Laterality Date   ANTERIOR CERVICAL CORPECTOMY N/A 02/21/2016   Procedure: ANTERIOR CERVICAL CORPECTOMY ANS FUSION CERVICAL SIX , ANTERIOR PLATING CERVICAL FIVE-SEVEN;  Surgeon: Consuella Lose, MD;  Location: [facility];  Service: Neurosurgery;  Laterality: N/A;   ANTERIOR FUSION CERVICAL SPINE  02/21/2016   Biopsy of adrenal glands     BREAST BIOPSY Bilateral    benign results, both breasts    CARDIAC CATHETERIZATION  2014   x1 stent placed, done in [place]    CARDIOVERSION N/A 11/11/2014   Procedure: CARDIOVERSION;  Surgeon: Larey Dresser, MD;  Location: [facility];  Service: Cardiovascular;  Laterality: N/A;   CAROTID ENDARTERECTOMY Left 07/29/2017   COLONOSCOPY W/ POLYPECTOMY     CORONARY STENT PLACEMENT     ENDARTERECTOMY Left 07/29/2017   Procedure: ENDARTERECTOMY CAROTID LEFT;  Surgeon: Rosetta Posner, MD;  Location: [facility];  Service: Vascular;  Laterality: Left;   PATCH ANGIOPLASTY Left 07/29/2017   Procedure: PATCH ANGIOPLASTY USING HEMASHIELD GOLD VASCULAR PATCH;  Surgeon: Rosetta Posner, MD;  Location: [facility];  Service: Vascular;  Laterality: Left;   TEE WITHOUT CARDIOVERSION N/A 11/11/2014   Procedure: TRANSESOPHAGEAL  ECHOCARDIOGRAM (TEE);  Surgeon: Larey Dresser, MD;  Location: [facility] ENDOSCOPY;  Service: Cardiovascular;  Laterality: N/A;    Current Outpatient Medications  Medication Sig Dispense Refill   acetaminophen (TYLENOL) 500 MG tablet Take 1,000 mg by mouth every 6 (six) hours as needed for mild pain or headache.     diazepam (VALIUM) 2 MG tablet Take 1 tablet (2 mg total) by mouth daily. 5 tablet 0   dofetilide (TIKOSYN) 250 MCG capsule Take 1 capsule (250 mcg total) by mouth 2 (two) times daily.     DULoxetine (CYMBALTA) 30 MG capsule Take 30 mg by mouth daily.     DULoxetine HCl  30 MG CSDR Take 30 mg by mouth daily.     ELIQUIS 5 MG TABS tablet TAKE 1 TABLET BY MOUTH TWICE A DAY (Patient taking differently: Take 5 mg by mouth 2 (two) times daily.) 180 tablet 1   Empagliflozin-metFORMIN HCl 12.06-998 MG TABS Take 1 tablet by mouth 2 (two) times daily.     furosemide (LASIX) 20 MG tablet Take 20 mg by mouth daily.     linaclotide (LINZESS) 290 MCG CAPS capsule Take 290 mcg by mouth daily before breakfast.     losartan (COZAAR) 50 MG tablet TAKE 1 TABLET BY MOUTH EVERY DAY (Patient taking differently: Take 50 mg by mouth daily.) 90 tablet 3   mirabegron ER (MYRBETRIQ) 50 MG TB24 tablet Take 50 mg by mouth daily.     Multiple Vitamin (MULTIVITAMIN) tablet Take 1 tablet by mouth at bedtime.      nicotine (NICODERM CQ - DOSED IN MG/24 HOURS) 14 mg/24hr patch 14 mg daily.     OVER THE COUNTER MEDICATION Take 15 mLs by mouth daily. SEAMOSS gel     potassium chloride SA (KLOR-CON M) 20 MEQ tablet Take 1 tablet (20 mEq total) by mouth daily. 90 tablet 1   repaglinide (PRANDIN) 2 MG tablet Take 2 mg by mouth in the morning and at bedtime.     No current facility-administered medications for this encounter.    Allergies  Allergen Reactions   Alendronate Sodium     Other reaction(s): pain all over   Avelox [Moxifloxacin]     Unknown reaction    Fosamax [Alendronate]     Pain all over   Pradaxa  [Dabigatran Etexilate Mesylate]     Extreme bleeding   Statins Nausea And Vomiting and Other (See Comments)    Muscle pain, Dizziness (intolerance)   Sulfa Antibiotics     Other reaction(s): vomiting   Sulfamethoxazole Nausea And Vomiting    Dizziness (intolerance)   Sulfonamide Derivatives Hives    Nausea vertigo   Varenicline     nightmares Other reaction(s): nightmares   Ciprofloxacin Rash    Other reaction(s): unknown   Gabapentin Rash    burning   Tape Itching and Rash    Please use "paper" tape only    Social History   Socioeconomic History   Marital status: Married    Spouse name: Ronny   Number of children: 2   Years of education: 18   Highest education level: Master's degree (e.g., MA, MS, MEng, MEd, MSW, MBA)  Occupational History   Occupation: retired    Comment: Pharmacist, hospital - art  Tobacco Use   Smoking status: Every Day    Packs/day: 1.00    Years: 55.00    Total pack years: 55.00    Types: Cigarettes   Smokeless tobacco: Never   Tobacco comments:    1 pack daily 08/15/2020  Vaping Use   Vaping Use: Never used  Substance and Sexual Activity   Alcohol use: Yes    Alcohol/week: 1.0 standard drink of alcohol    Types: 1 Glasses of wine per week   Drug use: No   Sexual activity: Yes  Other Topics Concern   Not on file  Social History Narrative   Lives with daughter and grandson   Right handed   Drinks 3-5 cups caffeine daily   She operates an entertainment business   Social Determinants of Radio broadcast assistant Strain: Not on file  Food Insecurity: Not on file  Transportation Needs: Not  on file  Physical Activity: Not on file  Stress: Not on file  Social Connections: Not on file  Intimate Partner Violence: Not on file    Family History  Problem Relation Age of Onset   Heart attack Father    Coronary artery disease Father        strong family hx   Bladder Cancer Mother        bladder   Leukemia Brother    Breast cancer Maternal Aunt     Breast cancer Paternal Aunt    Breast cancer Maternal Aunt     ROS- All systems are reviewed and negative except as per the HPI above  Physical Exam: Vitals:   08/09/21 1338  BP: 138/64  Pulse: 63  Weight: 62.7 kg  Height: '5\' 7"'$  (1.702 m)    GEN- The patient is well appearing, alert and oriented x 3 today.   Head- normocephalic, atraumatic Eyes-  Sclera clear, conjunctiva pink Ears- hearing intact Oropharynx- clear Neck- supple, no JVP Lymph- no cervical lymphadenopathy Lungs- Clear to ausculation bilaterally, normal work of breathing Heart- Regular rate and rhythm, no murmurs, rubs or gallops, PMI not laterally displaced GI- soft, NT, ND, + BS Extremities- no clubbing, cyanosis, or edema MS- no significant deformity or atrophy Skin- no rash or lesion Psych- euthymic mood, full affect Neuro- strength and sensation are intact  EKG- Vent. rate 63 BPM PR interval 128 ms QRS duration 116 ms QT/QTcB 440/450 ms P-R-T axes 97 -65 19 Sinus rhythm with Blocked Premature atrial complexes Pulmonary disease pattern Left anterior fascicular block Left ventricular hypertrophy with QRS widening ( R in aVL , Cornell product ) Cannot rule out Septal infarct , age undetermined Abnormal ECG When compared with ECG of 31-Jul-2021 12:06, PREVIOUS ECG IS PRESENT  Epic records reviewed  Echo-- Left ventricle: The cavity size was normal. Systolic function was   normal. The estimated ejection fraction was in the range of 50%   to 55%. Wall motion was normal; there were no regional wall   motion abnormalities. The study was not technically sufficient to   allow evaluation of LV diastolic dysfunction due to atrial   fibrillation. - Aortic valve: Trileaflet; normal thickness leaflets. There was no   regurgitation. - Aortic root: The aortic root was normal in size. - Ascending aorta: The ascending aorta was normal in size. - Mitral valve: Structurally normal valve. There was mild    regurgitation. - Left atrium: The atrium was normal in size. - Right ventricle: The cavity size was normal. Wall thickness was   normal. Systolic function was normal. - Tricuspid valve: There was mild-moderate regurgitation. - Pulmonary arteries: Systolic pressure was mildly increased. PA   peak pressure: 37 mm Hg (S). - Inferior vena cava: The vessel was normal in size. - Pericardium, extracardiac: There was no pericardial effusion.   Assessment and Plan: 1. Afib Recently reloaded on  tikosyn 250 mcg bid In SR with PAC's qtc is stable  Continue eliquis/ASA chadsvasc score at least 7 Bmet/mag today   2. Tobacco abuse Encouraged cessation  3. HTN  Stable    4. CAD  No  anginal symptoms   5. CVA Per neurology  Continue eliquis, zetia, stain intolerant   F/u  In afib clinic in 6 months, if new cardiologist assumes Tikosyn surveillance can cancel the 6 month f/u   Butch Penny C. Danaysia Rader, [facility] [address] [place], [place] [address] [phone]

## 2021-08-10 ENCOUNTER — Encounter (HOSPITAL_COMMUNITY): Payer: Self-pay

## 2021-08-11 ENCOUNTER — Other Ambulatory Visit (HOSPITAL_COMMUNITY): Payer: Self-pay | Admitting: Nurse Practitioner

## 2021-08-11 DIAGNOSIS — I639 Cerebral infarction, unspecified: Secondary | ICD-10-CM | POA: Diagnosis not present

## 2021-08-11 DIAGNOSIS — E119 Type 2 diabetes mellitus without complications: Secondary | ICD-10-CM | POA: Diagnosis not present

## 2021-08-11 DIAGNOSIS — E118 Type 2 diabetes mellitus with unspecified complications: Secondary | ICD-10-CM | POA: Diagnosis not present

## 2021-08-11 DIAGNOSIS — I480 Paroxysmal atrial fibrillation: Secondary | ICD-10-CM | POA: Diagnosis not present

## 2021-08-11 DIAGNOSIS — J441 Chronic obstructive pulmonary disease with (acute) exacerbation: Secondary | ICD-10-CM | POA: Diagnosis not present

## 2021-08-11 DIAGNOSIS — M6281 Muscle weakness (generalized): Secondary | ICD-10-CM | POA: Diagnosis not present

## 2021-08-11 DIAGNOSIS — F411 Generalized anxiety disorder: Secondary | ICD-10-CM | POA: Diagnosis not present

## 2021-08-11 DIAGNOSIS — J449 Chronic obstructive pulmonary disease, unspecified: Secondary | ICD-10-CM | POA: Diagnosis not present

## 2021-08-11 DIAGNOSIS — I5033 Acute on chronic diastolic (congestive) heart failure: Secondary | ICD-10-CM | POA: Diagnosis not present

## 2021-08-11 DIAGNOSIS — I6340 Cerebral infarction due to embolism of unspecified cerebral artery: Secondary | ICD-10-CM | POA: Diagnosis not present

## 2021-08-11 DIAGNOSIS — I5032 Chronic diastolic (congestive) heart failure: Secondary | ICD-10-CM | POA: Diagnosis not present

## 2021-08-11 DIAGNOSIS — R2681 Unsteadiness on feet: Secondary | ICD-10-CM | POA: Diagnosis not present

## 2021-08-11 DIAGNOSIS — I890 Lymphedema, not elsewhere classified: Secondary | ICD-10-CM | POA: Diagnosis not present

## 2021-08-11 DIAGNOSIS — I10 Essential (primary) hypertension: Secondary | ICD-10-CM | POA: Diagnosis not present

## 2021-08-11 DIAGNOSIS — I2510 Atherosclerotic heart disease of native coronary artery without angina pectoris: Secondary | ICD-10-CM | POA: Diagnosis not present

## 2021-08-15 ENCOUNTER — Inpatient Hospital Stay: Admission: RE | Admit: 2021-08-15 | Payer: Medicare PPO | Source: Ambulatory Visit

## 2021-08-16 DIAGNOSIS — E1165 Type 2 diabetes mellitus with hyperglycemia: Secondary | ICD-10-CM | POA: Diagnosis not present

## 2021-08-16 DIAGNOSIS — F411 Generalized anxiety disorder: Secondary | ICD-10-CM | POA: Diagnosis not present

## 2021-08-16 DIAGNOSIS — F3341 Major depressive disorder, recurrent, in partial remission: Secondary | ICD-10-CM | POA: Diagnosis not present

## 2021-08-16 DIAGNOSIS — I10 Essential (primary) hypertension: Secondary | ICD-10-CM | POA: Diagnosis not present

## 2021-08-16 DIAGNOSIS — Z8673 Personal history of transient ischemic attack (TIA), and cerebral infarction without residual deficits: Secondary | ICD-10-CM | POA: Diagnosis not present

## 2021-08-16 DIAGNOSIS — I480 Paroxysmal atrial fibrillation: Secondary | ICD-10-CM | POA: Diagnosis not present

## 2021-08-16 DIAGNOSIS — Z1231 Encounter for screening mammogram for malignant neoplasm of breast: Secondary | ICD-10-CM | POA: Diagnosis not present

## 2021-08-17 DIAGNOSIS — I480 Paroxysmal atrial fibrillation: Secondary | ICD-10-CM | POA: Diagnosis not present

## 2021-08-17 DIAGNOSIS — F3341 Major depressive disorder, recurrent, in partial remission: Secondary | ICD-10-CM | POA: Diagnosis not present

## 2021-08-17 DIAGNOSIS — I10 Essential (primary) hypertension: Secondary | ICD-10-CM | POA: Diagnosis not present

## 2021-08-17 DIAGNOSIS — E785 Hyperlipidemia, unspecified: Secondary | ICD-10-CM | POA: Diagnosis not present

## 2021-08-17 DIAGNOSIS — I5032 Chronic diastolic (congestive) heart failure: Secondary | ICD-10-CM | POA: Diagnosis not present

## 2021-08-17 DIAGNOSIS — I2510 Atherosclerotic heart disease of native coronary artery without angina pectoris: Secondary | ICD-10-CM | POA: Diagnosis not present

## 2021-08-17 DIAGNOSIS — Z1231 Encounter for screening mammogram for malignant neoplasm of breast: Secondary | ICD-10-CM | POA: Diagnosis not present

## 2021-08-19 DIAGNOSIS — I480 Paroxysmal atrial fibrillation: Secondary | ICD-10-CM | POA: Diagnosis not present

## 2021-08-19 DIAGNOSIS — I110 Hypertensive heart disease with heart failure: Secondary | ICD-10-CM | POA: Diagnosis not present

## 2021-08-19 DIAGNOSIS — K589 Irritable bowel syndrome without diarrhea: Secondary | ICD-10-CM | POA: Diagnosis not present

## 2021-08-19 DIAGNOSIS — R32 Unspecified urinary incontinence: Secondary | ICD-10-CM | POA: Diagnosis not present

## 2021-08-19 DIAGNOSIS — I5032 Chronic diastolic (congestive) heart failure: Secondary | ICD-10-CM | POA: Diagnosis not present

## 2021-08-19 DIAGNOSIS — S065X0D Traumatic subdural hemorrhage without loss of consciousness, subsequent encounter: Secondary | ICD-10-CM | POA: Diagnosis not present

## 2021-08-19 DIAGNOSIS — J441 Chronic obstructive pulmonary disease with (acute) exacerbation: Secondary | ICD-10-CM | POA: Diagnosis not present

## 2021-08-19 DIAGNOSIS — E1165 Type 2 diabetes mellitus with hyperglycemia: Secondary | ICD-10-CM | POA: Diagnosis not present

## 2021-08-19 DIAGNOSIS — I2510 Atherosclerotic heart disease of native coronary artery without angina pectoris: Secondary | ICD-10-CM | POA: Diagnosis not present

## 2021-08-29 DIAGNOSIS — E1165 Type 2 diabetes mellitus with hyperglycemia: Secondary | ICD-10-CM | POA: Diagnosis not present

## 2021-08-29 DIAGNOSIS — Z8673 Personal history of transient ischemic attack (TIA), and cerebral infarction without residual deficits: Secondary | ICD-10-CM | POA: Diagnosis not present

## 2021-08-29 DIAGNOSIS — I480 Paroxysmal atrial fibrillation: Secondary | ICD-10-CM | POA: Diagnosis not present

## 2021-08-29 DIAGNOSIS — I10 Essential (primary) hypertension: Secondary | ICD-10-CM | POA: Diagnosis not present

## 2021-08-29 DIAGNOSIS — E785 Hyperlipidemia, unspecified: Secondary | ICD-10-CM | POA: Diagnosis not present

## 2021-08-31 ENCOUNTER — Other Ambulatory Visit: Payer: Self-pay

## 2021-08-31 ENCOUNTER — Emergency Department (HOSPITAL_COMMUNITY): Payer: Medicare PPO

## 2021-08-31 ENCOUNTER — Encounter (HOSPITAL_COMMUNITY): Payer: Self-pay

## 2021-08-31 ENCOUNTER — Observation Stay (HOSPITAL_COMMUNITY)
Admission: EM | Admit: 2021-08-31 | Discharge: 2021-09-04 | Disposition: A | Discharge status: Skilled Nursing Facility | Payer: Medicare PPO | Attending: Internal Medicine | Admitting: Internal Medicine

## 2021-08-31 DIAGNOSIS — S0990XA Unspecified injury of head, initial encounter: Secondary | ICD-10-CM | POA: Diagnosis not present

## 2021-08-31 DIAGNOSIS — W19XXXA Unspecified fall, initial encounter: Secondary | ICD-10-CM

## 2021-08-31 DIAGNOSIS — H8109 Meniere's disease, unspecified ear: Secondary | ICD-10-CM | POA: Diagnosis present

## 2021-08-31 DIAGNOSIS — Z8673 Personal history of transient ischemic attack (TIA), and cerebral infarction without residual deficits: Secondary | ICD-10-CM | POA: Diagnosis not present

## 2021-08-31 DIAGNOSIS — I10 Essential (primary) hypertension: Secondary | ICD-10-CM | POA: Diagnosis not present

## 2021-08-31 DIAGNOSIS — F17210 Nicotine dependence, cigarettes, uncomplicated: Secondary | ICD-10-CM | POA: Diagnosis not present

## 2021-08-31 DIAGNOSIS — S199XXA Unspecified injury of neck, initial encounter: Secondary | ICD-10-CM | POA: Diagnosis not present

## 2021-08-31 DIAGNOSIS — R531 Weakness: Secondary | ICD-10-CM | POA: Diagnosis not present

## 2021-08-31 DIAGNOSIS — S065XAA Traumatic subdural hemorrhage with loss of consciousness status unknown, initial encounter: Secondary | ICD-10-CM

## 2021-08-31 DIAGNOSIS — J449 Chronic obstructive pulmonary disease, unspecified: Secondary | ICD-10-CM | POA: Diagnosis not present

## 2021-08-31 DIAGNOSIS — R7989 Other specified abnormal findings of blood chemistry: Secondary | ICD-10-CM | POA: Diagnosis present

## 2021-08-31 DIAGNOSIS — N281 Cyst of kidney, acquired: Secondary | ICD-10-CM | POA: Diagnosis not present

## 2021-08-31 DIAGNOSIS — R079 Chest pain, unspecified: Secondary | ICD-10-CM | POA: Diagnosis not present

## 2021-08-31 DIAGNOSIS — R413 Other amnesia: Secondary | ICD-10-CM | POA: Diagnosis not present

## 2021-08-31 DIAGNOSIS — E1165 Type 2 diabetes mellitus with hyperglycemia: Secondary | ICD-10-CM | POA: Insufficient documentation

## 2021-08-31 DIAGNOSIS — Z7984 Long term (current) use of oral hypoglycemic drugs: Secondary | ICD-10-CM | POA: Diagnosis not present

## 2021-08-31 DIAGNOSIS — F17200 Nicotine dependence, unspecified, uncomplicated: Secondary | ICD-10-CM | POA: Diagnosis present

## 2021-08-31 DIAGNOSIS — I110 Hypertensive heart disease with heart failure: Secondary | ICD-10-CM | POA: Insufficient documentation

## 2021-08-31 DIAGNOSIS — Z7901 Long term (current) use of anticoagulants: Secondary | ICD-10-CM | POA: Diagnosis not present

## 2021-08-31 DIAGNOSIS — E440 Moderate protein-calorie malnutrition: Secondary | ICD-10-CM | POA: Insufficient documentation

## 2021-08-31 DIAGNOSIS — E119 Type 2 diabetes mellitus without complications: Secondary | ICD-10-CM

## 2021-08-31 DIAGNOSIS — I6930 Unspecified sequelae of cerebral infarction: Secondary | ICD-10-CM | POA: Diagnosis not present

## 2021-08-31 DIAGNOSIS — I6200 Nontraumatic subdural hemorrhage, unspecified: Secondary | ICD-10-CM | POA: Diagnosis present

## 2021-08-31 DIAGNOSIS — I2510 Atherosclerotic heart disease of native coronary artery without angina pectoris: Secondary | ICD-10-CM | POA: Diagnosis not present

## 2021-08-31 DIAGNOSIS — H8103 Meniere's disease, bilateral: Secondary | ICD-10-CM

## 2021-08-31 DIAGNOSIS — I4891 Unspecified atrial fibrillation: Secondary | ICD-10-CM

## 2021-08-31 DIAGNOSIS — Y92003 Bedroom of unspecified non-institutional (private) residence as the place of occurrence of the external cause: Secondary | ICD-10-CM | POA: Insufficient documentation

## 2021-08-31 DIAGNOSIS — I480 Paroxysmal atrial fibrillation: Secondary | ICD-10-CM | POA: Insufficient documentation

## 2021-08-31 DIAGNOSIS — R778 Other specified abnormalities of plasma proteins: Secondary | ICD-10-CM | POA: Diagnosis not present

## 2021-08-31 DIAGNOSIS — R109 Unspecified abdominal pain: Secondary | ICD-10-CM | POA: Insufficient documentation

## 2021-08-31 DIAGNOSIS — R8281 Pyuria: Secondary | ICD-10-CM | POA: Insufficient documentation

## 2021-08-31 DIAGNOSIS — Y92009 Unspecified place in unspecified non-institutional (private) residence as the place of occurrence of the external cause: Secondary | ICD-10-CM

## 2021-08-31 DIAGNOSIS — W1830XA Fall on same level, unspecified, initial encounter: Secondary | ICD-10-CM | POA: Insufficient documentation

## 2021-08-31 DIAGNOSIS — S065X0A Traumatic subdural hemorrhage without loss of consciousness, initial encounter: Principal | ICD-10-CM | POA: Insufficient documentation

## 2021-08-31 DIAGNOSIS — Z955 Presence of coronary angioplasty implant and graft: Secondary | ICD-10-CM | POA: Diagnosis not present

## 2021-08-31 DIAGNOSIS — Z85828 Personal history of other malignant neoplasm of skin: Secondary | ICD-10-CM | POA: Insufficient documentation

## 2021-08-31 DIAGNOSIS — I5032 Chronic diastolic (congestive) heart failure: Secondary | ICD-10-CM | POA: Diagnosis present

## 2021-08-31 DIAGNOSIS — N3289 Other specified disorders of bladder: Secondary | ICD-10-CM | POA: Diagnosis not present

## 2021-08-31 DIAGNOSIS — N2889 Other specified disorders of kidney and ureter: Secondary | ICD-10-CM | POA: Diagnosis not present

## 2021-08-31 LAB — COMPREHENSIVE METABOLIC PANEL
ALT: 20 U/L (ref 0–44)
AST: 20 U/L (ref 15–41)
Albumin: 3.1 g/dL — ABNORMAL LOW (ref 3.5–5.0)
Alkaline Phosphatase: 80 U/L (ref 38–126)
Anion gap: 10 (ref 5–15)
BUN: 12 mg/dL (ref 8–23)
CO2: 26 mmol/L (ref 22–32)
Calcium: 8.9 mg/dL (ref 8.9–10.3)
Chloride: 106 mmol/L (ref 98–111)
Creatinine, Ser: 0.78 mg/dL (ref 0.44–1.00)
GFR, Estimated: >60 mL/min (ref >60)
Glucose, Bld: 118 mg/dL — ABNORMAL HIGH (ref 70–99)
Potassium: 3.9 mmol/L (ref 3.5–5.1)
Sodium: 142 mmol/L (ref 135–145)
Total Bilirubin: 0.9 mg/dL (ref 0.3–1.2)
Total Protein: 5.8 g/dL — ABNORMAL LOW (ref 6.5–8.1)

## 2021-08-31 LAB — CBC WITH DIFFERENTIAL/PLATELET
Abs Immature Granulocytes: 0.07 10*3/uL (ref 0.00–0.07)
Basophils Absolute: 0 10*3/uL (ref 0.0–0.1)
Basophils Relative: 0 %
Eosinophils Absolute: 0 10*3/uL (ref 0.0–0.5)
Eosinophils Relative: 0 %
HCT: 42.4 % (ref 36.0–46.0)
Hemoglobin: 14.5 g/dL (ref 12.0–15.0)
Immature Granulocytes: 1 %
Lymphocytes Relative: 12 %
Lymphs Abs: 1.2 10*3/uL (ref 0.7–4.0)
MCH: 31.4 pg (ref 26.0–34.0)
MCHC: 34.2 g/dL (ref 30.0–36.0)
MCV: 91.8 fL (ref 80.0–100.0)
Monocytes Absolute: 0.6 10*3/uL (ref 0.1–1.0)
Monocytes Relative: 6 %
Neutro Abs: 8 10*3/uL — ABNORMAL HIGH (ref 1.7–7.7)
Neutrophils Relative %: 81 %
Platelets: 215 10*3/uL (ref 150–400)
RBC: 4.62 MIL/uL (ref 3.87–5.11)
RDW: 12.7 % (ref 11.5–15.5)
WBC: 9.9 10*3/uL (ref 4.0–10.5)
nRBC: 0 % (ref 0.0–0.2)

## 2021-08-31 LAB — URINALYSIS, ROUTINE W REFLEX MICROSCOPIC
Bilirubin Urine: NEGATIVE
Glucose, UA: >=500 mg/dL — AB
Hgb urine dipstick: NEGATIVE
Ketones, ur: 20 mg/dL — AB
Nitrite: NEGATIVE
Protein, ur: 30 mg/dL — AB
Specific Gravity, Urine: 1.028 (ref 1.005–1.030)
WBC, UA: >50 WBC/hpf — ABNORMAL HIGH (ref 0–5)
pH: 6 (ref 5.0–8.0)

## 2021-08-31 LAB — CBG MONITORING, ED
Glucose-Capillary: 124 mg/dL — ABNORMAL HIGH (ref 70–99)
Glucose-Capillary: 155 mg/dL — ABNORMAL HIGH (ref 70–99)

## 2021-08-31 LAB — GLUCOSE, CAPILLARY: Glucose-Capillary: 136 mg/dL — ABNORMAL HIGH (ref 70–99)

## 2021-08-31 LAB — MAGNESIUM: Magnesium: 1.8 mg/dL (ref 1.7–2.4)

## 2021-08-31 LAB — TROPONIN I (HIGH SENSITIVITY)
Troponin I (High Sensitivity): 36 ng/L — ABNORMAL HIGH (ref <18)
Troponin I (High Sensitivity): 34 ng/L — ABNORMAL HIGH (ref <18)

## 2021-08-31 LAB — CK: Total CK: 127 U/L (ref 38–234)

## 2021-08-31 MED ORDER — ALBUTEROL SULFATE (2.5 MG/3ML) 0.083% IN NEBU: 2.5000 mg | INHALATION_SOLUTION | Freq: Four times a day (QID) | RESPIRATORY_TRACT | Status: DC | PRN

## 2021-08-31 MED ORDER — MAGNESIUM SULFATE 2 GM/50ML IV SOLN
2.0000 g | Freq: Once | INTRAVENOUS | Status: AC
  Administered 2021-08-31: 2 g via INTRAVENOUS
  Filled 2021-08-31: qty 50

## 2021-08-31 MED ORDER — IOHEXOL 300 MG/ML  SOLN
100.0000 mL | Freq: Once | INTRAMUSCULAR | Status: AC | PRN
  Administered 2021-08-31: 100 mL via INTRAVENOUS

## 2021-08-31 MED ORDER — ACETAMINOPHEN 325 MG PO TABS
650.0000 mg | ORAL_TABLET | Freq: Four times a day (QID) | ORAL | Status: DC | PRN
  Administered 2021-08-31 – 2021-09-03 (×2): 650 mg via ORAL
  Filled 2021-08-31 (×2): qty 2

## 2021-08-31 MED ORDER — MAGNESIUM OXIDE -MG SUPPLEMENT 400 (240 MG) MG PO TABS
200.0000 mg | ORAL_TABLET | Freq: Every day | ORAL | Status: DC
  Administered 2021-09-01 – 2021-09-04 (×4): 200 mg via ORAL
  Filled 2021-08-31 (×4): qty 1

## 2021-08-31 MED ORDER — ACETAMINOPHEN 650 MG RE SUPP: 650.0000 mg | Freq: Four times a day (QID) | RECTAL | Status: DC | PRN

## 2021-08-31 MED ORDER — LOSARTAN POTASSIUM 50 MG PO TABS
50.0000 mg | ORAL_TABLET | Freq: Every day | ORAL | Status: DC
  Administered 2021-08-31 – 2021-09-04 (×5): 50 mg via ORAL
  Filled 2021-08-31 (×5): qty 1

## 2021-08-31 MED ORDER — LINACLOTIDE 145 MCG PO CAPS
290.0000 ug | ORAL_CAPSULE | Freq: Every day | ORAL | Status: DC
  Administered 2021-09-01 – 2021-09-04 (×4): 290 ug via ORAL
  Filled 2021-08-31 (×4): qty 2

## 2021-08-31 MED ORDER — FUROSEMIDE 20 MG PO TABS
20.0000 mg | ORAL_TABLET | Freq: Every day | ORAL | Status: DC
  Administered 2021-08-31 – 2021-09-04 (×5): 20 mg via ORAL
  Filled 2021-08-31 (×5): qty 1

## 2021-08-31 MED ORDER — DIAZEPAM 2 MG PO TABS
2.0000 mg | ORAL_TABLET | Freq: Every day | ORAL | Status: DC
Start: 2021-08-31 — End: 2021-09-01
  Administered 2021-08-31: 2 mg via ORAL
  Filled 2021-08-31: qty 1

## 2021-08-31 MED ORDER — POTASSIUM CHLORIDE CRYS ER 20 MEQ PO TBCR
40.0000 meq | EXTENDED_RELEASE_TABLET | Freq: Once | ORAL | Status: AC
  Administered 2021-08-31: 40 meq via ORAL
  Filled 2021-08-31: qty 2

## 2021-08-31 MED ORDER — DOFETILIDE 250 MCG PO CAPS
250.0000 ug | ORAL_CAPSULE | Freq: Two times a day (BID) | ORAL | Status: DC
Start: 2021-08-31 — End: 2021-09-05
  Administered 2021-08-31 – 2021-09-04 (×9): 250 ug via ORAL
  Filled 2021-08-31 (×11): qty 1

## 2021-08-31 MED ORDER — ACETAMINOPHEN-CODEINE 300-30 MG PO TABS: 1.0000 | ORAL_TABLET | Freq: Four times a day (QID) | ORAL | Status: DC | PRN

## 2021-08-31 MED ORDER — DULOXETINE HCL 30 MG PO CPEP
30.0000 mg | ORAL_CAPSULE | Freq: Every day | ORAL | Status: DC
Start: 2021-08-31 — End: 2021-09-01
  Administered 2021-08-31: 30 mg via ORAL
  Filled 2021-08-31: qty 1

## 2021-08-31 MED ORDER — MIRABEGRON ER 50 MG PO TB24
50.0000 mg | ORAL_TABLET | Freq: Every day | ORAL | Status: DC
Start: 2021-08-31 — End: 2021-09-05
  Administered 2021-08-31 – 2021-09-04 (×5): 50 mg via ORAL
  Filled 2021-08-31 (×6): qty 1

## 2021-08-31 MED ORDER — SODIUM CHLORIDE 0.9% FLUSH
3.0000 mL | Freq: Two times a day (BID) | INTRAVENOUS | Status: DC
  Administered 2021-08-31 – 2021-09-04 (×9): 3 mL via INTRAVENOUS

## 2021-08-31 MED ORDER — INSULIN ASPART 100 UNIT/ML IJ SOLN
0.0000 [IU] | Freq: Three times a day (TID) | INTRAMUSCULAR | Status: DC
  Administered 2021-09-01 (×2): 2 [IU] via SUBCUTANEOUS
  Administered 2021-09-02: 5 [IU] via SUBCUTANEOUS
  Administered 2021-09-02 (×2): 2 [IU] via SUBCUTANEOUS

## 2021-08-31 MED ORDER — POTASSIUM CHLORIDE CRYS ER 20 MEQ PO TBCR
20.0000 meq | EXTENDED_RELEASE_TABLET | Freq: Every day | ORAL | Status: DC
  Administered 2021-09-01 – 2021-09-04 (×4): 20 meq via ORAL
  Filled 2021-08-31 (×4): qty 1

## 2021-08-31 NOTE — ED Provider Notes (Signed)
[facility] EMERGENCY DEPARTMENT Provider Note   CSN: [id number] Arrival date & time: 08/31/21  0935     History  Chief Complaint  Patient presents with   Melanie Lucas Melanie Lucas is a 71 y.o. female.  Presents to the emergency room due to concern for fall.  Had a couple falls yesterday, patient was unsure if she hit head.  Seem to be more weak than normal per daughter.  Has been having issues with mobility and intermittent weakness/lethargy since her recent stroke.  Patient denies any sort of pain at this time.  She answers basic questions but struggles with complex questioning.  Some bruising noted to left side.  Last dose of Eliquis was yesterday evening at approximately 9:36 PM per daughter.  History primarily obtained from daughter.  Some additional history obtained through review of chart, review of recent discharge summary.  HPI     Home Medications Prior to Admission medications   Medication Sig Start Date End Date Taking? Authorizing Provider  acetaminophen (TYLENOL) 500 MG tablet Take 1,000 mg by mouth every 6 (six) hours as needed for mild pain or headache.   Yes [provider]  albuterol (VENTOLIN HFA) 108 (90 Base) MCG/ACT inhaler Inhale 2 puffs into the lungs every 6 (six) hours as needed for wheezing or shortness of breath.   Yes [provider]  diazepam (VALIUM) 2 MG tablet Take 1 tablet (2 mg total) by mouth daily. Patient taking differently: Take 2 mg by mouth at bedtime. 08/01/21  Yes Dwyane Dee, MD  dofetilide (TIKOSYN) 250 MCG capsule Take 1 capsule (250 mcg total) by mouth 2 (two) times daily. 08/09/21  Yes Sherran Needs, NP  DULoxetine (CYMBALTA) 30 MG capsule Take 30 mg by mouth at bedtime. 08/02/21  Yes [provider]  ELIQUIS 5 MG TABS tablet TAKE 1 TABLET BY MOUTH TWICE A DAY Patient taking differently: Take 5 mg by mouth 2 (two) times daily. 10/31/20  Yes Allred, Jeneen Rinks, MD  Empagliflozin-metFORMIN HCl 12.06-998  MG TABS Take 1 tablet by mouth 2 (two) times daily.   Yes [provider]  furosemide (LASIX) 20 MG tablet Take 20 mg by mouth daily.   Yes [provider]  linaclotide (LINZESS) 290 MCG CAPS capsule Take 290 mcg by mouth daily before breakfast.   Yes [provider]  losartan (COZAAR) 50 MG tablet TAKE 1 TABLET BY MOUTH EVERY DAY Patient taking differently: Take 50 mg by mouth daily. 03/20/21  Yes Allred, Jeneen Rinks, MD  mirabegron ER (MYRBETRIQ) 50 MG TB24 tablet Take 50 mg by mouth at bedtime.   Yes [provider]  Multiple Vitamin (MULTIVITAMIN) tablet Take 1 tablet by mouth at bedtime.    Yes [provider]  potassium chloride SA (KLOR-CON M) 20 MEQ tablet Take 1 tablet (20 mEq total) by mouth daily. 08/01/21  Yes Dwyane Dee, MD  repaglinide (PRANDIN) 2 MG tablet Take 2 mg by mouth in the morning and at bedtime. 12/20/18  Yes [provider]      Allergies    Alendronate sodium, Avelox [moxifloxacin], Fosamax [alendronate], Pradaxa [dabigatran etexilate mesylate], Statins, Sulfa antibiotics, Sulfamethoxazole, Sulfonamide derivatives, Varenicline, Ciprofloxacin, Gabapentin, and Tape    Review of Systems   Review of Systems  Unable to perform ROS: Mental status change  All other systems reviewed and are negative.   Physical Exam Updated Vital Signs BP (!) 144/82   Pulse 75   Temp 98.9 F (37.2 C) (Rectal)  Resp 19   Ht '5\' 7"'$  (1.702 m)   Wt 62.7 kg   SpO2 99%   BMI 21.65 kg/m  Physical Exam Vitals and nursing note reviewed.  Constitutional:      Appearance: She is well-developed.     Comments: Patient is alert, answers basic questions but struggles with complex questioning  HENT:     Head: Normocephalic and atraumatic.  Eyes:     Conjunctiva/sclera: Conjunctivae normal.  Cardiovascular:     Rate and Rhythm: Normal rate and regular rhythm.     Heart sounds: No murmur heard. Pulmonary:     Effort: Pulmonary effort is  normal. No respiratory distress.     Breath sounds: Normal breath sounds.  Abdominal:     Palpations: Abdomen is soft.     Tenderness: There is no abdominal tenderness.  Musculoskeletal:        General: No swelling.     Cervical back: Neck supple.     Comments: Back: no midline C, T, L spine TTP, no step off or deformity; there is some tenderness to the left mid flank region, small ecchymosis noted to flank region RUE: no TTP throughout, no deformity, normal joint ROM, radial pulse intact, distal sensation and motor intact LUE: no TTP throughout, no deformity, normal joint ROM, radial pulse intact, distal sensation and motor intact RLE:  no TTP throughout, no deformity, normal joint ROM, distal pulse, sensation and motor intact LLE: no TTP throughout, no deformity, normal joint ROM, distal pulse, sensation and motor intact  Skin:    General: Skin is warm and dry.     Capillary Refill: Capillary refill takes less than 2 seconds.  Neurological:     Comments: Alert, oriented to person place but struggles with time, moves all 4 extremities  Psychiatric:        Mood and Affect: Mood normal.     ED Results / Procedures / Treatments   Labs (all labs ordered are listed, but only abnormal results are displayed) Labs Reviewed  CBC WITH DIFFERENTIAL/PLATELET - Abnormal; Notable for the following components:      Result Value   Neutro Abs 8.0 (*)    All other components within normal limits  COMPREHENSIVE METABOLIC PANEL - Abnormal; Notable for the following components:   Glucose, Bld 118 (*)    Total Protein 5.8 (*)    Albumin 3.1 (*)    All other components within normal limits  CBG MONITORING, ED - Abnormal; Notable for the following components:   Glucose-Capillary 124 (*)    All other components within normal limits  TROPONIN I (HIGH SENSITIVITY) - Abnormal; Notable for the following components:   Troponin I (High Sensitivity) 36 (*)    All other components within normal limits  CK   URINALYSIS, ROUTINE W REFLEX MICROSCOPIC  MAGNESIUM  TROPONIN I (HIGH SENSITIVITY)    EKG EKG Interpretation  Date/Time:  Thursday August 31 2021 10:01:32 EDT Ventricular Rate:  61 PR Interval:  119 QRS Duration: 126 QT Interval:  432 QTC Calculation: 436 R Axis:   -73 Text Interpretation: Sinus rhythm Supraventricular bigeminy Borderline short PR interval Right bundle branch block LVH with IVCD and secondary repol abnrm Confirmed by Madalyn Rob [phone]) on 08/31/2021 12:37:39 PM  Radiology CT ABDOMEN PELVIS W CONTRAST  Result Date: 08/31/2021 CLINICAL DATA:  Left-sided flank trauma. Concern for retroperitoneal hematoma. Fall. EXAM: CT ABDOMEN AND PELVIS WITH CONTRAST TECHNIQUE: Multidetector CT imaging of the abdomen and pelvis was performed using the standard protocol  following bolus administration of intravenous contrast. RADIATION DOSE REDUCTION: This exam was performed according to the departmental dose-optimization program which includes automated exposure control, adjustment of the mA and/or kV according to patient size and/or use of iterative reconstruction technique. CONTRAST:  151m OMNIPAQUE IOHEXOL 300 MG/ML  SOLN COMPARISON:  03/06/2021 chest abdomen and pelvic CTs. FINDINGS: Lower chest: No pleural fluid. No basilar pneumothorax. Mild cardiomegaly, without pericardial or pleural effusion. Tiny hiatal hernia. Hepatobiliary: Subcapsular 4 mm right hepatic lobe lesion is favored to represent cysts. Focal steatosis adjacent the falciform ligament. Normal gallbladder, without biliary ductal dilatation. Pancreas: Normal, without mass or ductal dilatation. Spleen: Normal in size, without focal abnormality. Adrenals/Urinary Tract: Right larger than left adrenal masses again identified. On the order of 5.1 cm on the right and 3.9 cm on the left. Similar to on the prior exam (when remeasured). Indeterminate based on density measurements similar back to 08/27/2018, consistent with adenomas  on that exam. Exophytic interpolar right renal 3.8 cm cyst or minimally complex cyst . In the absence of clinically indicated signs/symptoms require(s) no independent follow-up. Right renal too small to characterize lesion. Normal left kidney. No hydronephrosis. The bladder is mildly thick walled. Stomach/Bowel: The stomach is underdistended, including proximally. No gross abnormality identified. Normal colon, appendix, and terminal ileum. Normal small bowel. Vascular/Lymphatic: Aortic atherosclerosis. No abdominopelvic adenopathy. Reproductive: Normal uterus and adnexa. Other: No significant free fluid. Mild pelvic floor laxity. No free intraperitoneal air. Musculoskeletal: Mild osteopenia. Sclerosis involving the anterior left sixth rib on 12/03 is likely related to remote trauma. There is likely also remote trauma to the lateral left ninth rib on 25/3. Degenerate disc disease at the lumbosacral junction. Trace L4-5 anterolisthesis with disc bulge at L3-4. Mild convex left lumbar spine curvature. IMPRESSION: 1. No acute or posttraumatic deformity identified. 2. Mild bladder wall thickening. Correlate with urinalysis to exclude cystitis. 3.  Tiny hiatal hernia. 4. Large, bilateral adrenal adenomas. 5.  Aortic Atherosclerosis (ICD10-I70.0). Electronically Signed   By: KAbigail MiyamotoM.D.   On: 08/31/2021 12:22   CT Cervical Spine Wo Contrast  Result Date: 08/31/2021 CLINICAL DATA:  Neck trauma (Age >= 65y); fall EXAM: CT CERVICAL SPINE WITHOUT CONTRAST TECHNIQUE: Multidetector CT imaging of the cervical spine was performed without intravenous contrast. Multiplanar CT image reconstructions were also generated. RADIATION DOSE REDUCTION: This exam was performed according to the departmental dose-optimization program which includes automated exposure control, adjustment of the mA and/or kV according to patient size and/or use of iterative reconstruction technique. COMPARISON:  January 2023 FINDINGS: Alignment: Stable.  Skull base and vertebrae: Stable vertebral body heights. Stable appearance of ACDF C5-C7. No acute fracture. New lucency within the right C4 inferior facet likely reflects a synovial cyst related to facet arthropathy. Soft tissues and spinal canal: No prevertebral fluid or swelling. No visible canal hematoma. Disc levels: Degenerative changes are stable appears. Multilevel right foraminal narrowing. Upper chest: Minimally included lung apices are clear. Other: None. IMPRESSION: No acute cervical spine fracture. Electronically Signed   By: PMacy MisM.D.   On: 08/31/2021 12:21   CT Head Wo Contrast  Result Date: 08/31/2021 CLINICAL DATA:  Head trauma, moderate-severe EXAM: CT HEAD WITHOUT CONTRAST TECHNIQUE: Contiguous axial images were obtained from the base of the skull through the vertex without intravenous contrast. RADIATION DOSE REDUCTION: This exam was performed according to the departmental dose-optimization program which includes automated exposure control, adjustment of the mA and/or kV according to patient size and/or use of iterative reconstruction technique. COMPARISON:  None Available. FINDINGS: Brain: Acute subdural hemorrhage along the right cerebral convexity measuring up to 1 cm in thickness. No significant mass effect. Gray-white differentiation is preserved. There is no extra-axial fluid collection. Prominence of the ventricles and sulci reflects parenchymal volume loss. Confluent areas of low-density in the supratentorial white matter nonspecific but probably reflects similar chronic microvascular ischemic changes. There are chronic small vessel infarcts of central white matter and deep gray nuclei bilaterally. Vascular: There is atherosclerotic calcification at the skull base. Skull: Calvarium is unremarkable. Sinuses/Orbits: No acute finding. Other: Mastoid air cells are clear. IMPRESSION: Acute subdural hemorrhage along the right cerebral convexity. No significant mass effect. Stable  chronic/nonemergent findings detailed above. These results were called by telephone at the time of interpretation on 08/31/2021 at 12:11 pm to provider [facility] , who verbally acknowledged these results. Electronically Signed   By: Macy Mis M.D.   On: 08/31/2021 12:11   DG Chest Portable 1 View  Result Date: 08/31/2021 CLINICAL DATA:  Chest pain EXAM: PORTABLE CHEST 1 VIEW COMPARISON:  07/25/2021 FINDINGS: Elevation of the right hemidiaphragm. Chronic interstitial changes. No pleural effusion or pneumothorax. Similar mild cardiomegaly. Lower cervical ACDF. IMPRESSION: New elevation the right hemidiaphragm. Otherwise no acute process in the chest. Electronically Signed   By: Macy Mis M.D.   On: 08/31/2021 11:32    Procedures .Critical Care  Performed by: Lucrezia Starch, MD Authorized by: Lucrezia Starch, MD   Critical care provider statement:    Critical care time (minutes):  37   Critical care was time spent personally by me on the following activities:  Development of treatment plan with patient or surrogate, discussions with consultants, evaluation of patient's response to treatment, examination of patient, ordering and review of laboratory studies, ordering and review of radiographic studies, ordering and performing treatments and interventions, pulse oximetry, re-evaluation of patient's condition and review of old charts     Medications Ordered in ED Medications  dofetilide (TIKOSYN) capsule 250 mcg (has no administration in time range)  iohexol (OMNIPAQUE) 300 MG/ML solution 100 mL (100 mLs Intravenous Contrast Given 08/31/21 1151)  potassium chloride SA (KLOR-CON M) CR tablet 40 mEq (40 mEq Oral Given 08/31/21 1229)    ED Course/ Medical Decision Making/ A&P                           Medical Decision Making Amount and/or Complexity of Data Reviewed Labs: ordered. Radiology: ordered.  Risk Prescription drug management.   71 year old lady presenting to  ER due to concern for increasing weakness, falls.  Recent admission to hospital for stroke.  Patient has a history of paroxysmal atrial fibrillation on Eliquis.  I obtained additional history from review of chart and review of recent discharge summary in June.  On arrival here, I do not appreciate any obvious trauma to the head or face.  Did appreciate a small ecchymosis to her left flank.  Check labs, CT head, C-spine, abdomen pelvis.  Reviewed lab work, no significant anemia, no AKI.  CT head concerning for acute SDH.  Paged trauma and neurosurgery.  Dr. Grandville Silos called back first, agrees with neurosurgery consult, if patient appears stable, trauma does not need to be involved, can admit to medicine, does not need to be trauma alert. I discussed with Elsner, reviewed overall clinical picture, imaging. He advises to hold off on any reversal agents, recommending holding further doses of AC for now.  I discussed the case  with Dr. Tamala Julian with [facility], he will come admit patient.  I have updated daughter and patient throughout visit.        Final Clinical Impression(s) / ED Diagnoses Final diagnoses:  SDH (subdural hematoma) (HCC)  Atrial fibrillation, unspecified type [facility])    Rx / DC Orders ED Discharge Orders     None         Lucrezia Starch, MD 08/31/21 1311

## 2021-08-31 NOTE — Consult Note (Addendum)
Cardiology Consultation:   Patient ID: Melanie Lucas MRN: [id number]; DOB: Jun 21, 1950  Admit date: 08/31/2021 Date of Consult: 08/31/2021  PCP:  Melanie Lucas, [place] [facility] Cardiologist:  Melanie Dawley, MD  Electrophysiologist:  Melanie Grayer, MD       Patient Profile:   Melanie Lucas is a 71 y.o. female with a hx of mult CVAs, Afib on Tikosyn and Eliquis, DM2, MI s/p BMS Diag1 (in [place] 02/2013), L CEA 2019,  who is being seen 08/31/2021 for the evaluation of Afib, SDH at the request of Dr Melanie Lucas.  History of Present Illness:   Melanie Lucas had a stroke 02/2021, R occipital cortical infarct >> continue Eliquis.   Admitted w/ CVA 06/05-06/13/2023, acute and subacute infarcts in L BG and corona radiata, no severe intracranial stenosis, Acute on chronic diastolic CHF, Afib w/ Tikosyn reloaded, demand ischemia w/ trop peak 30.   Afib clinic visit 06/21 no med changes, in SR, cont Tikosyn 250 mcg bid. Pt planning to change to [facility] for cardiology.   Pt came to the ER after a couple of falls, thinks she hit her head. Daughter describes her as weak, has mobility issues. This has been a problem since her last stroke. She reported compliance w/ Eliquis. Cards and NS asked to see.   Pt is able to respond to questions, but only simple ones. She denies chest pain or SOB. Unclear if she has felt any atrial fib.   Not really able to provide any more information.     Past Medical History:  Diagnosis Date   Anxiety    Cancer ([place])    Skin- leg    Chronic acquired lymphedema    a. R>L;  b. 03/2012 Neg LE U/S for DVT. Legs since age 8   Complication of anesthesia    woke up during colonoscopy, Lithrostrixpy, Biospy   COPD (chronic obstructive pulmonary disease) ([place])    Coronary artery disease    Deaf, right    Diabetes mellitus    Type II   Diabetic neuropathy ([place])    Elevated TSH    a. 10/2012 - inst to f/u PCP.   Full dentures    Hematuria    a. while on  pradaxa,  she reports that she has seen Dr Margie Ege and had low risk cystoscopy   History of kidney stones    Hyperlipidemia    Hypertension    patient denies   Incontinence    prior to urinating   Lacunar infarction [facility])    a. 02/2009 non-acute Lacunar infarct of the right thalamus noted on MRI of brain.   Left carotid stenosis    Lymphedema of extremity    had this problem, since she was a teenager. especially seen in R LE   Meniere disease    Myocardial infarction [facility])    Obesity    Osteoarthritis    cervical & lumbar region, knees, hands cramp also    PAF with post-termination pauses    a. on dronedarone;  b. CHA2DS2VASc = 5 (HTN, DM, h/o lacunar infarct on MRI, Female) ->refused oral anticoagulation after h/o hematuria on pradaxa;  c. 02/2012 Echo: EF 55-60%, mildly dil LA. D. Recurrent PAF 10/2012 after only taking Multaq 1x/day - spont conv to NSR, placed back on BID Multaq/eliquis;  e. 10/2014 Multaq d/c'd->tikosyn initiated.   Shortness of breath dyspnea    with exertion   Stroke (HCC)    2 mini strokes  Tobacco abuse    Vision abnormalities     Past Surgical History:  Procedure Laterality Date   ANTERIOR CERVICAL CORPECTOMY N/A 02/21/2016   Procedure: ANTERIOR CERVICAL CORPECTOMY ANS FUSION CERVICAL SIX , ANTERIOR PLATING CERVICAL FIVE-SEVEN;  Surgeon: Consuella Lose, MD;  Location: [facility];  Service: Neurosurgery;  Laterality: N/A;   ANTERIOR FUSION CERVICAL SPINE  02/21/2016   Biopsy of adrenal glands     BREAST BIOPSY Bilateral    benign results, both breasts    CARDIAC CATHETERIZATION  2014   x1 stent placed, done in [place]    CARDIOVERSION N/A 11/11/2014   Procedure: CARDIOVERSION;  Surgeon: Larey Dresser, MD;  Location: [facility];  Service: Cardiovascular;  Laterality: N/A;   CAROTID ENDARTERECTOMY Left 07/29/2017   COLONOSCOPY W/ POLYPECTOMY     CORONARY STENT PLACEMENT     ENDARTERECTOMY Left 07/29/2017   Procedure: ENDARTERECTOMY CAROTID LEFT;  Surgeon:  Rosetta Posner, MD;  Location: [facility];  Service: Vascular;  Laterality: Left;   PATCH ANGIOPLASTY Left 07/29/2017   Procedure: PATCH ANGIOPLASTY USING HEMASHIELD GOLD VASCULAR PATCH;  Surgeon: Rosetta Posner, MD;  Location: [facility];  Service: Vascular;  Laterality: Left;   TEE WITHOUT CARDIOVERSION N/A 11/11/2014   Procedure: TRANSESOPHAGEAL ECHOCARDIOGRAM (TEE);  Surgeon: Larey Dresser, MD;  Location: [facility];  Service: Cardiovascular;  Laterality: N/A;     Home Medications:  Prior to Admission medications   Medication Sig Start Date End Date Taking? Authorizing Provider  acetaminophen (TYLENOL) 500 MG tablet Take 1,000 mg by mouth every 6 (six) hours as needed for mild pain or headache.   Yes [provider]  albuterol (VENTOLIN HFA) 108 (90 Base) MCG/ACT inhaler Inhale 2 puffs into the lungs every 6 (six) hours as needed for wheezing or shortness of breath.   Yes [provider]  diazepam (VALIUM) 2 MG tablet Take 1 tablet (2 mg total) by mouth daily. Patient taking differently: Take 2 mg by mouth at bedtime. 08/01/21  Yes Dwyane Dee, MD  dofetilide (TIKOSYN) 250 MCG capsule Take 1 capsule (250 mcg total) by mouth 2 (two) times daily. 08/09/21  Yes Sherran Needs, NP  DULoxetine (CYMBALTA) 30 MG capsule Take 30 mg by mouth at bedtime. 08/02/21  Yes [provider]  ELIQUIS 5 MG TABS tablet TAKE 1 TABLET BY MOUTH TWICE A DAY Patient taking differently: Take 5 mg by mouth 2 (two) times daily. 10/31/20  Yes Allred, Jeneen Rinks, MD  Empagliflozin-metFORMIN HCl 12.06-998 MG TABS Take 1 tablet by mouth 2 (two) times daily.   Yes [provider]  furosemide (LASIX) 20 MG tablet Take 20 mg by mouth daily.   Yes [provider]  linaclotide (LINZESS) 290 MCG CAPS capsule Take 290 mcg by mouth daily before breakfast.   Yes [provider]  losartan (COZAAR) 50 MG tablet TAKE 1 TABLET BY MOUTH EVERY DAY Patient taking differently: Take 50 mg by mouth  daily. 03/20/21  Yes Allred, Jeneen Rinks, MD  mirabegron ER (MYRBETRIQ) 50 MG TB24 tablet Take 50 mg by mouth at bedtime.   Yes [provider]  Multiple Vitamin (MULTIVITAMIN) tablet Take 1 tablet by mouth at bedtime.    Yes [provider]  potassium chloride SA (KLOR-CON M) 20 MEQ tablet Take 1 tablet (20 mEq total) by mouth daily. 08/01/21  Yes Dwyane Dee, MD  repaglinide (PRANDIN) 2 MG tablet Take 2 mg by mouth in the morning and at bedtime. 12/20/18  Yes [provider]  Inpatient Medications: Scheduled Meds:  diazepam  2 mg Oral QHS   dofetilide  250 mcg Oral BID   DULoxetine  30 mg Oral QHS   furosemide  20 mg Oral Daily   insulin aspart  0-9 Units Subcutaneous TID WC   [START ON 09/01/2021] linaclotide  290 mcg Oral QAC breakfast   losartan  50 mg Oral Daily   mirabegron ER  50 mg Oral QHS   sodium chloride flush  3 mL Intravenous Q12H   Continuous Infusions:  PRN Meds: acetaminophen **OR** acetaminophen, acetaminophen-codeine, albuterol  Allergies:    Allergies  Allergen Reactions   Alendronate Sodium Other (See Comments)    pain all over   Avelox [Moxifloxacin] Other (See Comments)    Unknown reaction    Fosamax [Alendronate] Other (See Comments)    Pain all over   Pradaxa [Dabigatran Etexilate Mesylate] Other (See Comments)    Extreme bleeding   Statins Nausea And Vomiting and Other (See Comments)    Muscle pain, Dizziness (intolerance)   Sulfa Antibiotics Nausea And Vomiting   Sulfamethoxazole Nausea And Vomiting    Dizziness (intolerance)   Sulfonamide Derivatives Hives    Nausea vertigo   Varenicline Other (See Comments)    nightmares   Ciprofloxacin Rash    Other reaction(s): unknown   Gabapentin Rash    burning   Tape Itching and Rash    Please use "paper" tape only    Social History:   Social History   Socioeconomic History   Marital status: Married    Spouse name: Ronny   Number of children: 2   Years of education:  18   Highest education level: Master's degree (e.g., MA, MS, MEng, MEd, MSW, MBA)  Occupational History   Occupation: retired    Comment: Pharmacist, hospital - art  Tobacco Use   Smoking status: Every Day    Packs/day: 1.00    Years: 55.00    Total pack years: 55.00    Types: Cigarettes   Smokeless tobacco: Never   Tobacco comments:    1 pack daily 08/15/2020  Vaping Use   Vaping Use: Never used  Substance and Sexual Activity   Alcohol use: Yes    Alcohol/week: 1.0 standard drink of alcohol    Types: 1 Glasses of wine per week   Drug use: No   Sexual activity: Yes  Other Topics Concern   Not on file  Social History Narrative   Lives with daughter and grandson   Right handed   Drinks 3-5 cups caffeine daily   She operates an entertainment business   Social Determinants of Radio broadcast assistant Strain: Not on file  Food Insecurity: Not on file  Transportation Needs: Not on file  Physical Activity: Not on file  Stress: Not on file  Social Connections: Not on file  Intimate Partner Violence: Not on file    Family History:   Family History  Problem Relation Age of Onset   Heart attack Father    Coronary artery disease Father        strong family hx   Bladder Cancer Mother        bladder   Leukemia Brother    Breast cancer Maternal Aunt    Breast cancer Paternal Aunt    Breast cancer Maternal Aunt      ROS:  Please see the history of present illness.  All other ROS reviewed and negative.     Physical Exam/Data:   Vitals:  08/31/21 1230 08/31/21 1330 08/31/21 1400 08/31/21 1430  BP: (!) 158/65 (!) 163/87 140/86 (!) 180/77  Pulse: 73 71 71 76  Resp: '20 17 16 16  '$ Temp:      TempSrc:      SpO2: 99% 97% 95% 95%  Weight:      Height:       No intake or output data in the 24 hours ending 08/31/21 1647    08/31/2021    9:48 AM 08/09/2021    1:38 PM 07/24/2021   10:46 AM  Last 3 Weights  Weight (lbs) 138 lb 3.7 oz 138 lb 3.2 oz 145 lb 15.1 oz  Weight (kg) 62.7 kg  62.687 kg 66.2 kg     Body mass index is 21.65 kg/m.  General:  Well nourished, well developed, female in no acute distress HEENT: normal for age Neck: no JVD Vascular: No carotid bruits; Distal pulses 2+ bilaterally Cardiac:  normal S1, S2; RRR; no murmur  Lungs:  clear to auscultation bilaterally, no wheezing, rhonchi or rales  Abd: soft, nontender, no hepatomegaly  Ext: no edema Musculoskeletal:  No deformities, BUE and BLE strength not tested Skin: warm and dry  Neuro:  CNs 2-12 intact, no focal abnormalities noted Psych:  Normal affect   EKG:  The EKG was personally reviewed and demonstrates:  SR, PACs, RBBB Telemetry:  Telemetry was personally reviewed and demonstrates:  SR  Relevant CV Studies:  ECHO: 03/07/2021  1. Left ventricular ejection fraction, by estimation, is 55 to 60%. The left ventricle has normal function. The left ventricle has no regional wall motion abnormalities. There is mild concentric left ventricular hypertrophy. Left ventricular diastolic parameters are consistent with Grade I diastolic dysfunction (impaired  relaxation).   2. Right ventricular systolic function is normal. The right ventricular size is normal. Tricuspid regurgitation signal is inadequate for assessing PA pressure.   3. The mitral valve is grossly normal. Trivial mitral valve  regurgitation. No evidence of mitral stenosis.   4. The aortic valve is tricuspid. Aortic valve regurgitation is not  visualized. No aortic stenosis is present.   5. The inferior vena cava is normal in size with greater than 50%  respiratory variability, suggesting right atrial pressure of 3 mmHg.   Comparison(s): Changes from prior study are noted. EF 55-60%. No WMA.   Laboratory Data:  High Sensitivity Troponin:   Recent Labs  Lab 08/31/21 1007 08/31/21 1202  TROPONINIHS 36* 34*     Chemistry Recent Labs  Lab 08/31/21 1007 08/31/21 1202  NA 142  --   K 3.9  --   CL 106  --   CO2 26  --   GLUCOSE  118*  --   BUN 12  --   CREATININE 0.78  --   CALCIUM 8.9  --   MG  --  1.8  GFRNONAA >60  --   ANIONGAP 10  --     Recent Labs  Lab 08/31/21 1007  PROT 5.8*  ALBUMIN 3.1*  AST 20  ALT 20  ALKPHOS 80  BILITOT 0.9   Lipids  Lab Results  Component Value Date   CHOL 181 07/25/2021   HDL 49 07/25/2021   LDLCALC 91 07/25/2021   TRIG 205 (H) 07/25/2021   CHOLHDL 3.7 07/25/2021     Hematology Recent Labs  Lab 08/31/21 1007  WBC 9.9  RBC 4.62  HGB 14.5  HCT 42.4  MCV 91.8  MCH 31.4  MCHC 34.2  RDW 12.7  PLT  215   Thyroid  Lab Results  Component Value Date   TSH 3.505 07/25/2021   Lab Results  Component Value Date   HGBA1C 7.3 (H) 07/25/2021    Radiology/Studies:  CT ABDOMEN PELVIS W CONTRAST  Result Date: 08/31/2021 CLINICAL DATA:  Left-sided flank trauma. Concern for retroperitoneal hematoma. Fall. EXAM: CT ABDOMEN AND PELVIS WITH CONTRAST TECHNIQUE: Multidetector CT imaging of the abdomen and pelvis was performed using the standard protocol following bolus administration of intravenous contrast. RADIATION DOSE REDUCTION: This exam was performed according to the departmental dose-optimization program which includes automated exposure control, adjustment of the mA and/or kV according to patient size and/or use of iterative reconstruction technique. CONTRAST:  132m OMNIPAQUE IOHEXOL 300 MG/ML  SOLN COMPARISON:  03/06/2021 chest abdomen and pelvic CTs. FINDINGS: Lower chest: No pleural fluid. No basilar pneumothorax. Mild cardiomegaly, without pericardial or pleural effusion. Tiny hiatal hernia. Hepatobiliary: Subcapsular 4 mm right hepatic lobe lesion is favored to represent cysts. Focal steatosis adjacent the falciform ligament. Normal gallbladder, without biliary ductal dilatation. Pancreas: Normal, without mass or ductal dilatation. Spleen: Normal in size, without focal abnormality. Adrenals/Urinary Tract: Right larger than left adrenal masses again identified. On  the order of 5.1 cm on the right and 3.9 cm on the left. Similar to on the prior exam (when remeasured). Indeterminate based on density measurements similar back to 08/27/2018, consistent with adenomas on that exam. Exophytic interpolar right renal 3.8 cm cyst or minimally complex cyst . In the absence of clinically indicated signs/symptoms require(s) no independent follow-up. Right renal too small to characterize lesion. Normal left kidney. No hydronephrosis. The bladder is mildly thick walled. Stomach/Bowel: The stomach is underdistended, including proximally. No gross abnormality identified. Normal colon, appendix, and terminal ileum. Normal small bowel. Vascular/Lymphatic: Aortic atherosclerosis. No abdominopelvic adenopathy. Reproductive: Normal uterus and adnexa. Other: No significant free fluid. Mild pelvic floor laxity. No free intraperitoneal air. Musculoskeletal: Mild osteopenia. Sclerosis involving the anterior left sixth rib on 12/03 is likely related to remote trauma. There is likely also remote trauma to the lateral left ninth rib on 25/3. Degenerate disc disease at the lumbosacral junction. Trace L4-5 anterolisthesis with disc bulge at L3-4. Mild convex left lumbar spine curvature. IMPRESSION: 1. No acute or posttraumatic deformity identified. 2. Mild bladder wall thickening. Correlate with urinalysis to exclude cystitis. 3.  Tiny hiatal hernia. 4. Large, bilateral adrenal adenomas. 5.  Aortic Atherosclerosis (ICD10-I70.0). Electronically Signed   By: KAbigail MiyamotoM.D.   On: 08/31/2021 12:22   CT Cervical Spine Wo Contrast  Result Date: 08/31/2021 CLINICAL DATA:  Neck trauma (Age >= 65y); fall EXAM: CT CERVICAL SPINE WITHOUT CONTRAST TECHNIQUE: Multidetector CT imaging of the cervical spine was performed without intravenous contrast. Multiplanar CT image reconstructions were also generated. RADIATION DOSE REDUCTION: This exam was performed according to the departmental dose-optimization program  which includes automated exposure control, adjustment of the mA and/or kV according to patient size and/or use of iterative reconstruction technique. COMPARISON:  January 2023 FINDINGS: Alignment: Stable. Skull base and vertebrae: Stable vertebral body heights. Stable appearance of ACDF C5-C7. No acute fracture. New lucency within the right C4 inferior facet likely reflects a synovial cyst related to facet arthropathy. Soft tissues and spinal canal: No prevertebral fluid or swelling. No visible canal hematoma. Disc levels: Degenerative changes are stable appears. Multilevel right foraminal narrowing. Upper chest: Minimally included lung apices are clear. Other: None. IMPRESSION: No acute cervical spine fracture. Electronically Signed   By: PAddison LankD.  On: 08/31/2021 12:21   CT Head Wo Contrast  Result Date: 08/31/2021 CLINICAL DATA:  Head trauma, moderate-severe EXAM: CT HEAD WITHOUT CONTRAST TECHNIQUE: Contiguous axial images were obtained from the base of the skull through the vertex without intravenous contrast. RADIATION DOSE REDUCTION: This exam was performed according to the departmental dose-optimization program which includes automated exposure control, adjustment of the mA and/or kV according to patient size and/or use of iterative reconstruction technique. COMPARISON:  None Available. FINDINGS: Brain: Acute subdural hemorrhage along the right cerebral convexity measuring up to 1 cm in thickness. No significant mass effect. Gray-white differentiation is preserved. There is no extra-axial fluid collection. Prominence of the ventricles and sulci reflects parenchymal volume loss. Confluent areas of low-density in the supratentorial white matter nonspecific but probably reflects similar chronic microvascular ischemic changes. There are chronic small vessel infarcts of central white matter and deep gray nuclei bilaterally. Vascular: There is atherosclerotic calcification at the skull base. Skull:  Calvarium is unremarkable. Sinuses/Orbits: No acute finding. Other: Mastoid air cells are clear. IMPRESSION: Acute subdural hemorrhage along the right cerebral convexity. No significant mass effect. Stable chronic/nonemergent findings detailed above. These results were called by telephone at the time of interpretation on 08/31/2021 at 12:11 pm to provider [facility] , who verbally acknowledged these results. Electronically Signed   By: Macy Mis M.D.   On: 08/31/2021 12:11   DG Chest Portable 1 View  Result Date: 08/31/2021 CLINICAL DATA:  Chest pain EXAM: PORTABLE CHEST 1 VIEW COMPARISON:  07/25/2021 FINDINGS: Elevation of the right hemidiaphragm. Chronic interstitial changes. No pleural effusion or pneumothorax. Similar mild cardiomegaly. Lower cervical ACDF. IMPRESSION: New elevation the right hemidiaphragm. Otherwise no acute process in the chest. Electronically Signed   By: Macy Mis M.D.   On: 08/31/2021 11:32     Assessment and Plan:   Acute SDH - happened after a fall when on Eliquis - NS has seen, no Eliquis until the SDH has resolved, may take months.  2. Atrial fib - pt on Tikosyn, is maintaining SR - K+ 3.9, Mg 1.8 >> supplement ordered.   - continue to follow labs - QTc 436, continue 250 mcg bid - if there is concern for Afib playing a role in her strokes, may need loop recorder to be definitive in this, or Watchman device  3. Hx CVA x 2 in 2023 - no acute vessel closure either time in CT - Per Dr Leonie Man 07/26/2021, likely due to small vessel dz  Otherwise, per IM    Risk Assessment/Risk Scores:        New York Heart Association (NYHA) Functional Class NYHA Class II  CHA2DS2-VASc Score = 8   This indicates a 10.8% annual risk of stroke. The patient's score is based upon: CHF History: 1 HTN History: 1 Diabetes History: 1 Stroke History: 2 Vascular Disease History: 1 Age Score: 1 Gender Score: 1    For questions or updates, please contact [place] Please consult [email or web address] for contact info under    Signed, Rosaria Ferries, PA-C  08/31/2021 4:47 PM

## 2021-08-31 NOTE — ED Notes (Signed)
Patient transported to floor at this time.

## 2021-08-31 NOTE — Plan of Care (Signed)
The patient is admitted form ER to 5W 21. A & O x 3. Denied any acute pain at this moment. Full assessment to epic not completed due to confusion and forgetfulness. Will be completed when the family comes to the bedside tomorrow. Will continue to monitor.

## 2021-08-31 NOTE — ED Notes (Signed)
Called to clarify report had been given to [facility] W Therapist, sports.  Additional questions answered.

## 2021-08-31 NOTE — Progress Notes (Addendum)
Pharmacy: Dofetilide (Tikosyn) - Continuation from Home Assessment and Electrolyte Replacement  Pharmacy consulted to assist in monitoring and replacing electrolytes in this 71 y.o. female admitted on 08/31/2021 undergoing dofetilide continuation from home. Last taken prior to arrival 08/30/2021 ~2100.  Assessment:  Patient Exclusion Criteria: If any screening criteria checked as "Yes", then  patient  should NOT receive dofetilide until criteria item is corrected.  If "Yes" please indicate correction plan.  YES  NO Patient  Exclusion Criteria Correction Plan   '[]'$   '[x]'$   Baseline QTc interval is greater than or equal to 440 msec. IF above YES box checked dofetilide contraindicated unless patient has ICD; then may proceed if QTc 500-550 msec or with known ventricular conduction abnormalities may proceed with QTc 550-600 msec. QTc =      '[]'$   '[x]'$   Patient is known or suspected to have a digoxin level greater than 2 ng/ml: No results found for: "DIGOXIN"     '[]'$   '[x]'$   Creatinine clearance less than 20 ml/min (calculated using Cockcroft-Gault, actual body weight and serum creatinine): Estimated Creatinine Clearance: 60.9 mL/min (by C-G formula based on SCr of 0.78 mg/dL).     '[]'$   '[x]'$  Patient has received drugs known to prolong the QT intervals within the last 48 hours (phenothiazines, tricyclics or tetracyclic antidepressants, erythromycin, H-1 antihistamines, cisapride, fluoroquinolones, azithromycin). Drugs not listed above may have an, as yet, undetected potential to prolong the QT interval, updated information on QT prolonging agents is available at this website:QT prolonging agents or www.crediblemeds.org    '[]'$   '[x]'$   Patient received a dose of hydrochlorothiazide (Oretic) alone or in any combination including triamterene (Dyazide, Maxzide) in the last 48 hours.    '[]'$   '[x]'$  Patient received a medication known to increase dofetilide plasma concentrations prior to initial dofetilide  dose:  Trimethoprim (Primsol, Proloprim) in the last 36 hours Verapamil (Calan, Verelan) in the last 36 hours or a sustained release dose in the last 72 hours Megestrol (Megace) in the last 5 days  Cimetidine (Tagamet) in the last 6 hours Ketoconazole (Nizoral) in the last 24 hours Itraconazole (Sporanox) in the last 48 hours  Prochlorperazine (Compazine) in the last 36 hours     '[]'$   '[x]'$   Patient is known to have a history of torsades de pointes; congenital or acquired long QT syndromes.    '[]'$   '[x]'$   Patient has received a Class 1 antiarrhythmic with less than 2 half-lives since last dose. (Disopyramide, Quinidine, Procainamide, Lidocaine, Mexiletine, Flecainide, Propafenone)    '[]'$   '[x]'$   Patient has received amiodarone therapy in the past 3 months or amiodarone level is greater than 0.3 ng/ml.    Patient has been appropriately anticoagulated with taking eliquis at home last dose 7/12pm. Here for fall and SDH. Anticoagulation plan is pending.  Labs:    Component Value Date/Time   K 3.9 08/31/2021 1007   MG 1.8 08/31/2021 1202     Plan: Potassium: K 3.8-3.9:  Hold Tikosyn initiation and give KCl 40 mEq po x1 then begin Tikosyn at least 2hr after KCl dose - do not need to recheck K   Magnesium: Mg 1.8-2: Give Mg 2 gm IV x1 to prevent Mg from dropping below 1.8 - do not need to recheck Mg. Appropriate to initiate Tikosyn  Continue Tikosyn home regimen following K & Mag replacement above. Continue Dofetilide 250 mcg BID.  Thank you for allowing pharmacy to participate in this patient's care   Heloise Purpura 08/31/2021  1:23  PM

## 2021-08-31 NOTE — H&P (Addendum)
History and Physical    Patient: Melanie Lucas ZOX:[id number] DOB: 1950-07-17 DOA: 08/31/2021 DOS: the patient was seen and examined on 08/31/2021 PCP: Kristen Loader, FNP  Patient coming from: Home  Chief Complaint:  Chief Complaint  Patient presents with   Fall   HPI: Melanie Lucas is a 71 y.o. female with medical history significant of hypertension, hyperlipidemia, PAF on Eliquis, diastolic CHF, CVA, CAD s/p PCI in 2014, peripheral vascular disease s/p left CEA in 2019, Mnire's disease (deaf in right ear and partial deaf in left ear), and tobacco abuse who presents after having several falls at home.  History is obtained from her daughter present at bedside.  Patient had recent right occipital stroke in January and suffered acute-subacute infarcts of the left basal ganglia and corona radiata in June of this year for which daughter states that her short-term memory and speech have been significantly affected.  She had issues with her balance since the initial stroke in 2019, but since the most recent stroke had needed to rely more on her walker.  She had had an appointment with her new neurologist 2 days ago and had been ordered a new narrow or walker so that she could use it in her home.  Then yesterday patient woke up and was noted to be more wobbly.  Daughter has cameras in the home with which to watch her and she has a roommate whom lives with her.  Apparently she was unable to get a hold of her until later on the afternoon and saw her in the floor of the living room.  From what she can tell after going over there the patient had fallen while trying to change the sheets on her bed and likely hit her head.  She suffered bruising over her elbows and legs.  They were able to get her up, but overnight had several falls thereafter for which the roommate helped her get up.  This morning patient reported not feeling well and requested to come to the emergency department for further evaluation.   On  admission into the emergency department patient was noted to be afebrile with respiration 1524, blood pressures 135/113 -156/66, and O2 saturations maintained.  CT scan of the head noted a acute subarachnoid hemorrhage along the right cerebral convexity.  CT scan of the abdomen and pelvis noted some bladder wall thickening, tiny hiatal hernia, and large bilateral adrenal adenomas.  Labs noted high-sensitivity troponin 36->34.  Neurosurgery have been consulted and recommended holding Eliquis. Patient was given 2 g of magnesium sulfide, Tikosyn, and potassium chloride 40 mEq p.o.  Review of Systems: As mentioned in the history of present illness. All other systems reviewed and are negative. Past Medical History:  Diagnosis Date   Anxiety    Cancer ([place])    Skin- leg    Chronic acquired lymphedema    a. R>L;  b. 03/2012 Neg LE U/S for DVT. Legs since age 1   Complication of anesthesia    woke up during colonoscopy, Lithrostrixpy, Biospy   COPD (chronic obstructive pulmonary disease) ([place])    Coronary artery disease    CVD (cardiovascular disease)    Deaf, right    Diabetes mellitus    Type II   Diabetic neuropathy ([place])    Dysrhythmia    afib   Elevated TSH    a. 10/2012 - inst to f/u PCP.   Full dentures    Hematuria    a. while on pradaxa,  she  reports that she has seen Dr Margie Ege and had low risk cystoscopy   History of kidney stones    Hyperlipidemia    Hypertension    patient denies   Incontinence    prior to urinating   Lacunar infarction [facility])    a. 02/2009 non-acute Lacunar infarct of the right thalamus noted on MRI of brain.   Left carotid stenosis    Lymphedema of extremity    had this problem, since she was a teenager. especially seen in R LE   Meniere disease    Myocardial infarction [facility])    Obesity    Osteoarthritis    cervical & lumbar region, knees, hands cramp also    PAF with post-termination pauses    a. on dronedarone;  b. CHA2DS2VASc = 5 (HTN, DM, h/o lacunar  infarct on MRI, Female) ->refused oral anticoagulation after h/o hematuria on pradaxa;  c. 02/2012 Echo: EF 55-60%, mildly dil LA. D. Recurrent PAF 10/2012 after only taking Multaq 1x/day - spont conv to NSR, placed back on BID Multaq/eliquis;  e. 10/2014 Multaq d/c'd->tikosyn initiated.   Shortness of breath dyspnea    with exertion   Stroke [facility])    2 mini strokes   Tobacco abuse    Vision abnormalities    Past Surgical History:  Procedure Laterality Date   ANTERIOR CERVICAL CORPECTOMY N/A 02/21/2016   Procedure: ANTERIOR CERVICAL CORPECTOMY ANS FUSION CERVICAL SIX , ANTERIOR PLATING CERVICAL FIVE-SEVEN;  Surgeon: Consuella Lose, MD;  Location: [place];  Service: Neurosurgery;  Laterality: N/A;   ANTERIOR FUSION CERVICAL SPINE  02/21/2016   Biopsy of adrenal glands     BREAST BIOPSY Bilateral    benign results, both breasts    CARDIAC CATHETERIZATION  2014   x1 stent placed, done in [place]    CARDIOVERSION N/A 11/11/2014   Procedure: CARDIOVERSION;  Surgeon: Larey Dresser, MD;  Location: [place];  Service: Cardiovascular;  Laterality: N/A;   CAROTID ENDARTERECTOMY Left 07/29/2017   COLONOSCOPY W/ POLYPECTOMY     CORONARY STENT PLACEMENT     ENDARTERECTOMY Left 07/29/2017   Procedure: ENDARTERECTOMY CAROTID LEFT;  Surgeon: Rosetta Posner, MD;  Location: [place];  Service: Vascular;  Laterality: Left;   PATCH ANGIOPLASTY Left 07/29/2017   Procedure: PATCH ANGIOPLASTY USING HEMASHIELD GOLD VASCULAR PATCH;  Surgeon: Rosetta Posner, MD;  Location: [place];  Service: Vascular;  Laterality: Left;   TEE WITHOUT CARDIOVERSION N/A 11/11/2014   Procedure: TRANSESOPHAGEAL ECHOCARDIOGRAM (TEE);  Surgeon: Larey Dresser, MD;  Location: [place];  Service: Cardiovascular;  Laterality: N/A;   Social History:  reports that she has been smoking cigarettes. She has a 55.00 pack-year smoking history. She has never used smokeless tobacco. She reports current alcohol use of about 1.0 standard drink of  alcohol per week. She reports that she does not use drugs.  Allergies  Allergen Reactions   Alendronate Sodium Other (See Comments)    pain all over   Avelox [Moxifloxacin] Other (See Comments)    Unknown reaction    Fosamax [Alendronate] Other (See Comments)    Pain all over   Pradaxa [Dabigatran Etexilate Mesylate] Other (See Comments)    Extreme bleeding   Statins Nausea And Vomiting and Other (See Comments)    Muscle pain, Dizziness (intolerance)   Sulfa Antibiotics Nausea And Vomiting   Sulfamethoxazole Nausea And Vomiting    Dizziness (intolerance)   Sulfonamide Derivatives Hives    Nausea vertigo   Varenicline Other (See Comments)  nightmares   Ciprofloxacin Rash    Other reaction(s): unknown   Gabapentin Rash    burning   Tape Itching and Rash    Please use "paper" tape only    Family History  Problem Relation Age of Onset   Heart attack Father    Coronary artery disease Father        strong family hx   Bladder Cancer Mother        bladder   Leukemia Brother    Breast cancer Maternal Aunt    Breast cancer Paternal Aunt    Breast cancer Maternal Aunt     Prior to Admission medications   Medication Sig Start Date End Date Taking? Authorizing Provider  acetaminophen (TYLENOL) 500 MG tablet Take 1,000 mg by mouth every 6 (six) hours as needed for mild pain or headache.   Yes [provider]  albuterol (VENTOLIN HFA) 108 (90 Base) MCG/ACT inhaler Inhale 2 puffs into the lungs every 6 (six) hours as needed for wheezing or shortness of breath.   Yes [provider]  diazepam (VALIUM) 2 MG tablet Take 1 tablet (2 mg total) by mouth daily. Patient taking differently: Take 2 mg by mouth at bedtime. 08/01/21  Yes Dwyane Dee, MD  dofetilide (TIKOSYN) 250 MCG capsule Take 1 capsule (250 mcg total) by mouth 2 (two) times daily. 08/09/21  Yes Sherran Needs, NP  DULoxetine (CYMBALTA) 30 MG capsule Take 30 mg by mouth at bedtime. 08/02/21  Yes [provider]  ELIQUIS 5 MG TABS tablet TAKE 1 TABLET BY MOUTH TWICE A DAY Patient taking differently: Take 5 mg by mouth 2 (two) times daily. 10/31/20  Yes Allred, Jeneen Rinks, MD  Empagliflozin-metFORMIN HCl 12.06-998 MG TABS Take 1 tablet by mouth 2 (two) times daily.   Yes [provider]  furosemide (LASIX) 20 MG tablet Take 20 mg by mouth daily.   Yes [provider]  linaclotide (LINZESS) 290 MCG CAPS capsule Take 290 mcg by mouth daily before breakfast.   Yes [provider]  losartan (COZAAR) 50 MG tablet TAKE 1 TABLET BY MOUTH EVERY DAY Patient taking differently: Take 50 mg by mouth daily. 03/20/21  Yes Allred, Jeneen Rinks, MD  mirabegron ER (MYRBETRIQ) 50 MG TB24 tablet Take 50 mg by mouth at bedtime.   Yes [provider]  Multiple Vitamin (MULTIVITAMIN) tablet Take 1 tablet by mouth at bedtime.    Yes [provider]  potassium chloride SA (KLOR-CON M) 20 MEQ tablet Take 1 tablet (20 mEq total) by mouth daily. 08/01/21  Yes Dwyane Dee, MD  repaglinide (PRANDIN) 2 MG tablet Take 2 mg by mouth in the morning and at bedtime. 12/20/18  Yes [provider]    Physical Exam: Vitals:   08/31/21 1130 08/31/21 1200 08/31/21 1218 08/31/21 1221  BP: (!) 144/82     Pulse: 75 73 71 75  Resp: 16 (!) '22 15 19  '$ Temp:      TempSrc:      SpO2: 99% 99% 100% 99%  Weight:      Height:        Constitutional: Elderly female currently resting in no acute distress, but able to follow commands. Eyes: PERRL, lids and conjunctivae normal ENMT: Mucous membranes are moist.   Neck: normal, supple, no masses, no thyromegaly Respiratory: Normal respiratory effort without significant wheezes rhonchi appreciated at this time. Cardiovascular: Regular rate and rhythm, no murmurs / rubs / gallops.  Lymphedema of the bilateral lower extremities.  2+ pedal pulses. No carotid bruits.  Abdomen: no tenderness, no masses palpated.  Bowel sounds. Musculoskeletal: no  clubbing / cyanosis. No joint deformity upper and lower extremities.   Skin: Bruising noted of the bilateral elbows and lower extremities. Neurologic: CN 2-12 grossly intact.  Able to move all extremities  Psychiatric: Poor short-term memory.  Alert and oriented to person and place, but not time  Data Reviewed:  Sinus rhythm at 61 bpm in supraventricular bigeminy  Assessment and Plan: Subdural hemorrhage secondary to fall Acute.  Subdural hemorrhage convexity noted on CT imaging of the right cerebral convexity.  Thought this is likely related to falls along with the fact patient is on anticoagulation.  Dr. Ellene Route of neurosurgery consulted.  Recommended to hold anticoagulation. -Admit to a telemetry bed -Neurochecks -Bed alarm on -Hold Eliquis -Consider need of repeat CT if patient neuro exam changes -Appreciate neurosurgery consultative services, we will follow-up for any further recommendation  Paroxysmal atrial fibrillation on chronic anticoagulation Patient appears to be in sinus rhythm at this time. CHA2DS2-VASc score = 7. -Held Eliquis due to subarachnoid hemorrhage -Continue Tikosyn -Goal potassium at least 4 and magnesium 2  Elevated troponin Acute. High-sensitivity troponins 36->34.  Essentially fell at an EKG without significant ischemic changes.  Patient did not denied any complaints of chest pain.  History of CVA with residual deficit Patient suffered right occipital stroke in January and suffered acute-subacute infarcts of the left basal ganglia and corona radiata in June of this year.  As a result of the strokes daughter reports short-term memory is poor and she has had issues with her speech. -PT to eval and treat  Chronic diastolic CHF EF noted to be 55 to 60% with grade 1 diastolic dysfunction in 05/2681.  Patient appears to be euvolemic at this time. -Daily weight  Essential hypertension Blood pressures currently well controlled at 133/67 -Continue furosemide and  losartan  Memory loss Thought to be related to strokes. -Delirium precautions  Diabetes mellitus type 2 without long-term use of insulin On admission glucose 118.  Home medication regimen includes Prandin 2 mg twice daily and Empagliflozin-metFORMIN HC 12.5 -1000 mg twice daily. -Hypoglycemic protocol -Hold home insulin regimen -CBGs before every meal with sensitive SSI  COPD -Breathing treatments as needed  Mnire's disease Patient is deaf in the right ear and has partial doing in the left. -Speak to the patient on the left ear  DVT prophylaxis: SCDs Advance Care Planning:   Code Status: Full Code  Consults: Neurosurgery  Family Communication: Daughter updated at bedside  Severity of Illness: The appropriate patient status for this patient is OBSERVATION. Observation status is judged to be reasonable and necessary in order to provide the required intensity of service to ensure the patient's safety. The patient's presenting symptoms, physical exam findings, and initial radiographic and laboratory data in the context of their medical condition is felt to place them at decreased risk for further clinical deterioration. Furthermore, it is anticipated that the patient will be medically stable for discharge from the hospital within 2 midnights of admission.   Author: Norval Morton, MD 08/31/2021 1:07 PM  For on call review [email or web address].

## 2021-08-31 NOTE — ED Triage Notes (Signed)
Pt had a stroke a month ago states she was weaker then normal yesterday by the end of the day she fell in bedroom hitting her head denies LOC today pt c/o left lateral pain and left hip and bilateral elbows. Bruising to the left lateral side

## 2021-08-31 NOTE — Consult Note (Signed)
Reason for Consult: Acute subdural hematoma Referring Physician: Fuller Plan, MD  Melanie Lucas is an 71 y.o. female.  HPI: Melanie Lucas is a 71 year old right-handed individual who apparently has had a syncopal episode today.  She has been falling a lot at home but today she struck her head and had some external signs of trauma.  A CT scan of the head was performed on arrival here and it notes that she has a right parietal subdural hematoma that is acute in nature with minimal mass effect.  There is no significant shift.  There is a moderate amount of atrophy in her intracranial contents.  Patient has had a history of a stroke in the recent past a few months ago and she has been recovering from that.  She has some known right-sided weakness.  Weakness on the right side appears to be at baseline.  Her last medical history notes that she has been on anticoagulants for atrial fibrillation and history of stroke.  Past Medical History:  Diagnosis Date   Anxiety    Cancer ([place])    Skin- leg    Chronic acquired lymphedema    a. R>L;  b. 03/2012 Neg LE U/S for DVT. Legs since age 71   Complication of anesthesia    woke up during colonoscopy, Lithrostrixpy, Biospy   COPD (chronic obstructive pulmonary disease) ([place])    Coronary artery disease    CVD (cardiovascular disease)    Deaf, right    Diabetes mellitus    Type II   Diabetic neuropathy ([place])    Dysrhythmia    afib   Elevated TSH    a. 10/2012 - inst to f/u PCP.   Full dentures    Hematuria    a. while on pradaxa,  she reports that she has seen Dr Margie Ege and had low risk cystoscopy   History of kidney stones    Hyperlipidemia    Hypertension    patient denies   Incontinence    prior to urinating   Lacunar infarction [facility])    a. 02/2009 non-acute Lacunar infarct of the right thalamus noted on MRI of brain.   Left carotid stenosis    Lymphedema of extremity    had this problem, since she was a teenager. especially seen in [place] LE   Meniere  disease    Myocardial infarction [facility])    Obesity    Osteoarthritis    cervical & lumbar region, knees, hands cramp also    PAF with post-termination pauses    a. on dronedarone;  b. CHA2DS2VASc = 5 (HTN, DM, h/o lacunar infarct on MRI, Female) ->refused oral anticoagulation after h/o hematuria on pradaxa;  c. 02/2012 Echo: EF 55-60%, mildly dil LA. D. Recurrent PAF 10/2012 after only taking Multaq 1x/day - spont conv to NSR, placed back on BID Multaq/eliquis;  e. 10/2014 Multaq d/c'd->tikosyn initiated.   Shortness of breath dyspnea    with exertion   Stroke [facility])    2 mini strokes   Tobacco abuse    Vision abnormalities     Past Surgical History:  Procedure Laterality Date   ANTERIOR CERVICAL CORPECTOMY N/A 02/21/2016   Procedure: ANTERIOR CERVICAL CORPECTOMY ANS FUSION CERVICAL SIX , ANTERIOR PLATING CERVICAL FIVE-SEVEN;  Surgeon: Consuella Lose, MD;  Location: [place];  Service: Neurosurgery;  Laterality: N/A;   ANTERIOR FUSION CERVICAL SPINE  02/21/2016   Biopsy of adrenal glands     BREAST BIOPSY Bilateral    benign results, both breasts  CARDIAC CATHETERIZATION  2014   x1 stent placed, done in [place]    CARDIOVERSION N/A 11/11/2014   Procedure: CARDIOVERSION;  Surgeon: Larey Dresser, MD;  Location: [place];  Service: Cardiovascular;  Laterality: N/A;   CAROTID ENDARTERECTOMY Left 07/29/2017   COLONOSCOPY W/ POLYPECTOMY     CORONARY STENT PLACEMENT     ENDARTERECTOMY Left 07/29/2017   Procedure: ENDARTERECTOMY CAROTID LEFT;  Surgeon: Rosetta Posner, MD;  Location[facility] OR;  Service: Vascular;  Laterality: Left;   PATCH ANGIOPLASTY Left 07/29/2017   Procedure: PATCH ANGIOPLASTY USING HEMASHIELD GOLD VASCULAR PATCH;  Surgeon: Rosetta Posner, MD;  Location: [place];  Service: Vascular;  Laterality: Left;   TEE WITHOUT CARDIOVERSION N/A 11/11/2014   Procedure: TRANSESOPHAGEAL ECHOCARDIOGRAM (TEE);  Surgeon: Larey Dresser, MD;  Location: [facility] ENDOSCOPY;  Service: Cardiovascular;   Laterality: N/A;    Family History  Problem Relation Age of Onset   Heart attack Father    Coronary artery disease Father        strong family hx   Bladder Cancer Mother        bladder   Leukemia Brother    Breast cancer Maternal Aunt    Breast cancer Paternal Aunt    Breast cancer Maternal Aunt     Social History:  reports that she has been smoking cigarettes. She has a 55.00 pack-year smoking history. She has never used smokeless tobacco. She reports current alcohol use of about 1.0 standard drink of alcohol per week. She reports that she does not use drugs.  Allergies:  Allergies  Allergen Reactions   Alendronate Sodium Other (See Comments)    pain all over   Avelox [Moxifloxacin] Other (See Comments)    Unknown reaction    Fosamax [Alendronate] Other (See Comments)    Pain all over   Pradaxa [Dabigatran Etexilate Mesylate] Other (See Comments)    Extreme bleeding   Statins Nausea And Vomiting and Other (See Comments)    Muscle pain, Dizziness (intolerance)   Sulfa Antibiotics Nausea And Vomiting   Sulfamethoxazole Nausea And Vomiting    Dizziness (intolerance)   Sulfonamide Derivatives Hives    Nausea vertigo   Varenicline Other (See Comments)    nightmares   Ciprofloxacin Rash    Other reaction(s): unknown   Gabapentin Rash    burning   Tape Itching and Rash    Please use "paper" tape only    Medications: I have reviewed the patient's current medications.  Results for orders placed or performed during the hospital encounter of 08/31/21 (from the past 48 hour(s))  CBG monitoring, ED     Status: Abnormal   Collection Time: 08/31/21  9:47 AM  Result Value Ref Range   Glucose-Capillary 124 (H) 70 - 99 mg/dL    Comment: Glucose reference range applies only to samples taken after fasting for at least 8 hours.  CBC with Differential     Status: Abnormal   Collection Time: 08/31/21 10:07 AM  Result Value Ref Range   WBC 9.9 4.0 - 10.5 K/uL   RBC 4.62 3.87 - 5.11  MIL/uL   Hemoglobin 14.5 12.0 - 15.0 g/dL   HCT 42.4 36.0 - 46.0 %   MCV 91.8 80.0 - 100.0 fL   MCH 31.4 26.0 - 34.0 pg   MCHC 34.2 30.0 - 36.0 g/dL   RDW 12.7 11.5 - 15.5 %   Platelets 215 150 - 400 K/uL   nRBC 0.0 0.0 - 0.2 %  Neutrophils Relative % 81 %   Neutro Abs 8.0 (H) 1.7 - 7.7 K/uL   Lymphocytes Relative 12 %   Lymphs Abs 1.2 0.7 - 4.0 K/uL   Monocytes Relative 6 %   Monocytes Absolute 0.6 0.1 - 1.0 K/uL   Eosinophils Relative 0 %   Eosinophils Absolute 0.0 0.0 - 0.5 K/uL   Basophils Relative 0 %   Basophils Absolute 0.0 0.0 - 0.1 K/uL   Immature Granulocytes 1 %   Abs Immature Granulocytes 0.07 0.00 - 0.07 K/uL    Comment: Performed at [place] [address], [place], [place] [address]  Comprehensive metabolic panel     Status: Abnormal   Collection Time: 08/31/21 10:07 AM  Result Value Ref Range   Sodium 142 135 - 145 mmol/L   Potassium 3.9 3.5 - 5.1 mmol/L   Chloride 106 98 - 111 mmol/L   CO2 26 22 - 32 mmol/L   Glucose, Bld 118 (H) 70 - 99 mg/dL    Comment: Glucose reference range applies only to samples taken after fasting for at least 8 hours.   BUN 12 8 - 23 mg/dL   Creatinine, Ser 0.78 0.44 - 1.00 mg/dL   Calcium 8.9 8.9 - 10.3 mg/dL   Total Protein 5.8 (L) 6.5 - 8.1 g/dL   Albumin 3.1 (L) 3.5 - 5.0 g/dL   AST 20 15 - 41 U/L   ALT 20 0 - 44 U/L   Alkaline Phosphatase 80 38 - 126 U/L   Total Bilirubin 0.9 0.3 - 1.2 mg/dL   GFR, Estimated >60 >60 mL/min    Comment: (NOTE) Calculated using the CKD-EPI Creatinine Equation (2021)    Anion gap 10 5 - 15    Comment: Performed at [facility], [place] [address], [place], [place] [address]  Troponin I (High Sensitivity)     Status: Abnormal   Collection Time: 08/31/21 10:07 AM  Result Value Ref Range   Troponin I (High Sensitivity) 36 (H) <18 ng/L    Comment: (NOTE) Elevated high sensitivity troponin I (hsTnI) values and significant  changes across serial measurements may suggest ACS but  many other  chronic and acute conditions are known to elevate hsTnI results.  Refer to the "Links" section for chest pain algorithms and additional  guidance. Performed at [facility], [place] [address], [place], [place] [address]   CK     Status: None   Collection Time: 08/31/21 10:07 AM  Result Value Ref Range   Total CK 127 38 - 234 U/L    Comment: Performed at [facility], [place] [address], [place], [place] [address]  Troponin I (High Sensitivity)     Status: Abnormal   Collection Time: 08/31/21 12:02 PM  Result Value Ref Range   Troponin I (High Sensitivity) 34 (H) <18 ng/L    Comment: (NOTE) Elevated high sensitivity troponin I (hsTnI) values and significant  changes across serial measurements may suggest ACS but many other  chronic and acute conditions are known to elevate hsTnI results.  Refer to the "Links" section for chest pain algorithms and additional  guidance. Performed at [facility], [place] [address], [place], [place] [address]   Magnesium     Status: None   Collection Time: 08/31/21 12:02 PM  Result Value Ref Range   Magnesium 1.8 1.7 - 2.4 mg/dL    Comment: Performed at [facility], [place] [address], [place], [place] [address]  CT ABDOMEN PELVIS W CONTRAST  Result Date: 08/31/2021 CLINICAL DATA:  Left-sided flank trauma. Concern for retroperitoneal hematoma. Fall. EXAM: CT ABDOMEN AND PELVIS WITH CONTRAST TECHNIQUE: Multidetector CT imaging of the abdomen and pelvis was performed using the standard protocol following bolus administration of intravenous contrast. RADIATION DOSE REDUCTION: This exam was performed according to the departmental dose-optimization program which includes automated exposure control, adjustment of the mA and/or kV according to patient size and/or use of iterative reconstruction technique. CONTRAST:  118m OMNIPAQUE IOHEXOL 300 MG/ML  SOLN COMPARISON:  03/06/2021 chest abdomen and pelvic CTs. FINDINGS: Lower chest: No  pleural fluid. No basilar pneumothorax. Mild cardiomegaly, without pericardial or pleural effusion. Tiny hiatal hernia. Hepatobiliary: Subcapsular 4 mm right hepatic lobe lesion is favored to represent cysts. Focal steatosis adjacent the falciform ligament. Normal gallbladder, without biliary ductal dilatation. Pancreas: Normal, without mass or ductal dilatation. Spleen: Normal in size, without focal abnormality. Adrenals/Urinary Tract: Right larger than left adrenal masses again identified. On the order of 5.1 cm on the right and 3.9 cm on the left. Similar to on the prior exam (when remeasured). Indeterminate based on density measurements similar back to 08/27/2018, consistent with adenomas on that exam. Exophytic interpolar right renal 3.8 cm cyst or minimally complex cyst . In the absence of clinically indicated signs/symptoms require(s) no independent follow-up. Right renal too small to characterize lesion. Normal left kidney. No hydronephrosis. The bladder is mildly thick walled. Stomach/Bowel: The stomach is underdistended, including proximally. No gross abnormality identified. Normal colon, appendix, and terminal ileum. Normal small bowel. Vascular/Lymphatic: Aortic atherosclerosis. No abdominopelvic adenopathy. Reproductive: Normal uterus and adnexa. Other: No significant free fluid. Mild pelvic floor laxity. No free intraperitoneal air. Musculoskeletal: Mild osteopenia. Sclerosis involving the anterior left sixth rib on 12/03 is likely related to remote trauma. There is likely also remote trauma to the lateral left ninth rib on 25/3. Degenerate disc disease at the lumbosacral junction. Trace L4-5 anterolisthesis with disc bulge at L3-4. Mild convex left lumbar spine curvature. IMPRESSION: 1. No acute or posttraumatic deformity identified. 2. Mild bladder wall thickening. Correlate with urinalysis to exclude cystitis. 3.  Tiny hiatal hernia. 4. Large, bilateral adrenal adenomas. 5.  Aortic Atherosclerosis  (ICD10-I70.0). Electronically Signed   By: KAbigail MiyamotoM.D.   On: 08/31/2021 12:22   CT Cervical Spine Wo Contrast  Result Date: 08/31/2021 CLINICAL DATA:  Neck trauma (Age >= 65y); fall EXAM: CT CERVICAL SPINE WITHOUT CONTRAST TECHNIQUE: Multidetector CT imaging of the cervical spine was performed without intravenous contrast. Multiplanar CT image reconstructions were also generated. RADIATION DOSE REDUCTION: This exam was performed according to the departmental dose-optimization program which includes automated exposure control, adjustment of the mA and/or kV according to patient size and/or use of iterative reconstruction technique. COMPARISON:  January 2023 FINDINGS: Alignment: Stable. Skull base and vertebrae: Stable vertebral body heights. Stable appearance of ACDF C5-C7. No acute fracture. New lucency within the right C4 inferior facet likely reflects a synovial cyst related to facet arthropathy. Soft tissues and spinal canal: No prevertebral fluid or swelling. No visible canal hematoma. Disc levels: Degenerative changes are stable appears. Multilevel right foraminal narrowing. Upper chest: Minimally included lung apices are clear. Other: None. IMPRESSION: No acute cervical spine fracture. Electronically Signed   By: PMacy MisM.D.   On: 08/31/2021 12:21   CT Head Wo Contrast  Result Date: 08/31/2021 CLINICAL DATA:  Head trauma, moderate-severe EXAM: CT HEAD WITHOUT CONTRAST TECHNIQUE: Contiguous axial images were obtained from the base of the  skull through the vertex without intravenous contrast. RADIATION DOSE REDUCTION: This exam was performed according to the departmental dose-optimization program which includes automated exposure control, adjustment of the mA and/or kV according to patient size and/or use of iterative reconstruction technique. COMPARISON:  None Available. FINDINGS: Brain: Acute subdural hemorrhage along the right cerebral convexity measuring up to 1 cm in thickness. No  significant mass effect. Gray-white differentiation is preserved. There is no extra-axial fluid collection. Prominence of the ventricles and sulci reflects parenchymal volume loss. Confluent areas of low-density in the supratentorial white matter nonspecific but probably reflects similar chronic microvascular ischemic changes. There are chronic small vessel infarcts of central white matter and deep gray nuclei bilaterally. Vascular: There is atherosclerotic calcification at the skull base. Skull: Calvarium is unremarkable. Sinuses/Orbits: No acute finding. Other: Mastoid air cells are clear. IMPRESSION: Acute subdural hemorrhage along the right cerebral convexity. No significant mass effect. Stable chronic/nonemergent findings detailed above. These results were called by telephone at the time of interpretation on 08/31/2021 at 12:11 pm to provider [facility] , who verbally acknowledged these results. Electronically Signed   By: Macy Mis M.D.   On: 08/31/2021 12:11   DG Chest Portable 1 View  Result Date: 08/31/2021 CLINICAL DATA:  Chest pain EXAM: PORTABLE CHEST 1 VIEW COMPARISON:  07/25/2021 FINDINGS: Elevation of the right hemidiaphragm. Chronic interstitial changes. No pleural effusion or pneumothorax. Similar mild cardiomegaly. Lower cervical ACDF. IMPRESSION: New elevation the right hemidiaphragm. Otherwise no acute process in the chest. Electronically Signed   By: Macy Mis M.D.   On: 08/31/2021 11:32    Review of Systems  Constitutional:  Positive for activity change.  Musculoskeletal:  Positive for back pain.  Skin: Negative.   All other systems reviewed and are negative.  Blood pressure (!) 180/77, pulse 76, temperature 98.9 F (37.2 C), temperature source Rectal, resp. rate 16, height '5\' 7"'$  (1.702 m), weight 62.7 kg, SpO2 95 %. Physical Exam Constitutional:      Appearance: Normal appearance. She is normal weight.  HENT:     Head: Normocephalic and atraumatic.     Nose:  Nose normal.     Mouth/Throat:     Mouth: Mucous membranes are moist.     Pharynx: Oropharynx is clear.  Eyes:     Extraocular Movements: Extraocular movements intact.     Conjunctiva/sclera: Conjunctivae normal.     Pupils: Pupils are equal, round, and reactive to light.  Pulmonary:     Effort: Pulmonary effort is normal.     Breath sounds: Normal breath sounds.  Abdominal:     General: Abdomen is flat. Bowel sounds are normal.     Palpations: Abdomen is soft.  Musculoskeletal:        General: Normal range of motion.  Skin:    General: Skin is warm and dry.     Capillary Refill: Capillary refill takes less than 2 seconds.  Neurological:     Mental Status: She is alert.     Comments: Mild right-sided weakness in the upper extremities and lower extremities noted at 4 out of 5 with a slight drift on the right side.  And bulk appear to be symmetric.  Deep tendon reflexes are 2+ in the biceps and triceps 2+ in the patellae and the Achilles Babinski's equivocal on the right side.  Psychiatric:        Mood and Affect: Mood normal.        Behavior: Behavior normal.  Thought Content: Thought content normal.        Judgment: Judgment normal.     Assessment/Plan: Patient is a 70 year old individual with a recent history of stroke and some residual right-sided weakness who now has a right-sided parietal subdural hematoma with minimal mass effect.  Plan is to not do any surgery on this and let this process resolve itself.  She should be off anticoagulants for the current time and is not likely that they will be able to be restarted for a few months time until her subdural resolves completely.  We will follow along clinically for now.  Blanchie Dessert Hiliary [place]orto 08/31/2021, 2:40 PM

## 2021-09-01 DIAGNOSIS — E440 Moderate protein-calorie malnutrition: Secondary | ICD-10-CM | POA: Insufficient documentation

## 2021-09-01 DIAGNOSIS — I6200 Nontraumatic subdural hemorrhage, unspecified: Secondary | ICD-10-CM | POA: Diagnosis not present

## 2021-09-01 DIAGNOSIS — I480 Paroxysmal atrial fibrillation: Secondary | ICD-10-CM | POA: Diagnosis not present

## 2021-09-01 DIAGNOSIS — W19XXXA Unspecified fall, initial encounter: Secondary | ICD-10-CM | POA: Diagnosis not present

## 2021-09-01 DIAGNOSIS — I5032 Chronic diastolic (congestive) heart failure: Secondary | ICD-10-CM | POA: Diagnosis not present

## 2021-09-01 DIAGNOSIS — I6930 Unspecified sequelae of cerebral infarction: Secondary | ICD-10-CM | POA: Diagnosis not present

## 2021-09-01 DIAGNOSIS — J449 Chronic obstructive pulmonary disease, unspecified: Secondary | ICD-10-CM | POA: Diagnosis not present

## 2021-09-01 DIAGNOSIS — I10 Essential (primary) hypertension: Secondary | ICD-10-CM | POA: Diagnosis not present

## 2021-09-01 DIAGNOSIS — Z7901 Long term (current) use of anticoagulants: Secondary | ICD-10-CM | POA: Diagnosis not present

## 2021-09-01 DIAGNOSIS — R778 Other specified abnormalities of plasma proteins: Secondary | ICD-10-CM | POA: Diagnosis not present

## 2021-09-01 LAB — BASIC METABOLIC PANEL
Anion gap: 10 (ref 5–15)
BUN: 13 mg/dL (ref 8–23)
CO2: 27 mmol/L (ref 22–32)
Calcium: 8.7 mg/dL — ABNORMAL LOW (ref 8.9–10.3)
Chloride: 104 mmol/L (ref 98–111)
Creatinine, Ser: 0.68 mg/dL (ref 0.44–1.00)
GFR, Estimated: >60 mL/min (ref >60)
Glucose, Bld: 124 mg/dL — ABNORMAL HIGH (ref 70–99)
Potassium: 3.5 mmol/L (ref 3.5–5.1)
Sodium: 141 mmol/L (ref 135–145)

## 2021-09-01 LAB — GLUCOSE, CAPILLARY
Glucose-Capillary: 119 mg/dL — ABNORMAL HIGH (ref 70–99)
Glucose-Capillary: 193 mg/dL — ABNORMAL HIGH (ref 70–99)
Glucose-Capillary: 187 mg/dL — ABNORMAL HIGH (ref 70–99)

## 2021-09-01 LAB — CBC
HCT: 41.2 % (ref 36.0–46.0)
Hemoglobin: 14.3 g/dL (ref 12.0–15.0)
MCH: 31.4 pg (ref 26.0–34.0)
MCHC: 34.7 g/dL (ref 30.0–36.0)
MCV: 90.4 fL (ref 80.0–100.0)
Platelets: 207 10*3/uL (ref 150–400)
RBC: 4.56 MIL/uL (ref 3.87–5.11)
RDW: 12.8 % (ref 11.5–15.5)
WBC: 8.3 10*3/uL (ref 4.0–10.5)
nRBC: 0 % (ref 0.0–0.2)

## 2021-09-01 LAB — URINALYSIS, ROUTINE W REFLEX MICROSCOPIC
Bacteria, UA: NONE SEEN
Bilirubin Urine: NEGATIVE
Glucose, UA: >=500 mg/dL — AB
Ketones, ur: 20 mg/dL — AB
Nitrite: NEGATIVE
Protein, ur: 30 mg/dL — AB
Specific Gravity, Urine: 1.026 (ref 1.005–1.030)
WBC, UA: >50 WBC/hpf — ABNORMAL HIGH (ref 0–5)
pH: 6 (ref 5.0–8.0)

## 2021-09-01 MED ORDER — BUSPIRONE HCL 5 MG PO TABS: 5.0000 mg | ORAL_TABLET | Freq: Two times a day (BID) | ORAL | Status: DC

## 2021-09-01 MED ORDER — ADULT MULTIVITAMIN W/MINERALS CH
1.0000 | ORAL_TABLET | Freq: Every day | ORAL | Status: DC
  Administered 2021-09-01 – 2021-09-04 (×4): 1 via ORAL
  Filled 2021-09-01 (×4): qty 1

## 2021-09-01 MED ORDER — ENSURE ENLIVE PO LIQD
237.0000 mL | Freq: Two times a day (BID) | ORAL | Status: DC
  Administered 2021-09-01 – 2021-09-04 (×8): 237 mL via ORAL

## 2021-09-01 MED ORDER — INSULIN ASPART 100 UNIT/ML IJ SOLN
0.0000 [IU] | Freq: Every day | INTRAMUSCULAR | Status: DC
  Administered 2021-09-01: 5 [IU] via SUBCUTANEOUS

## 2021-09-01 MED ORDER — LORAZEPAM 0.5 MG PO TABS
0.5000 mg | ORAL_TABLET | Freq: Every day | ORAL | Status: DC
Start: 2021-09-01 — End: 2021-09-02
  Administered 2021-09-01: 0.5 mg via ORAL
  Filled 2021-09-01: qty 1

## 2021-09-01 MED ORDER — MELATONIN 5 MG PO TABS
5.0000 mg | ORAL_TABLET | Freq: Every day | ORAL | Status: DC
  Administered 2021-09-01 – 2021-09-04 (×4): 5 mg via ORAL
  Filled 2021-09-01 (×4): qty 1

## 2021-09-01 MED ORDER — DULOXETINE HCL 30 MG PO CPEP
30.0000 mg | ORAL_CAPSULE | Freq: Every day | ORAL | Status: DC
  Administered 2021-09-02 – 2021-09-04 (×3): 30 mg via ORAL
  Filled 2021-09-01 (×3): qty 1

## 2021-09-01 NOTE — Evaluation (Signed)
Physical Therapy Evaluation Patient Details Name: Melanie Lucas MRN: [id number] DOB: 04-Feb-1951 Today's Date: 09/01/2021  History of Present Illness  71 y.o. female presents to [address] on 08/31/2021 after having a fall at home. Head CT reveals an acute subdural hemorrhage along the right cerebral convexity. Abdominal and pelvic CT reveals minor hiatal hernia and large B adrenal adenomas. 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  Clinical Impression  Pt presents with impairments of generalized weakness, activity intolerance, and balance and gait deficits. Pt is limited by fatigue and strength during therapy today. Pt requires physical assistance for bed mobility and to transfer sit to stand. Pt reports dizziness while standing, that persists after returning to sitting and supine. BP after return to supine is 146/78. Continued therapy is recommended to assist the pt in building strength and activity tolerance so she can progress back to her baseline level of mobilization and ambulation. Pt's deficits place her at an increased risk of falling again. PT recommends SNF upon discharge to provide increased physical assistance until pt is able to mobilize safely, as she currently has insufficient caregiver support.     Recommendations for follow up therapy are one component of a multi-disciplinary discharge planning process, led by the attending physician.  Recommendations may be updated based on patient status, additional functional criteria and insurance authorization.  Follow Up Recommendations Skilled nursing-short term rehab (<3 hours/day) Can patient physically be transported by private vehicle: No    Assistance Recommended at Discharge Frequent or constant Supervision/Assistance  Patient can return home with the following  A lot of help with walking and/or transfers;A lot of help with  bathing/dressing/bathroom;Assistance with cooking/housework;Direct supervision/assist for medications management;Direct supervision/assist for financial management;Assist for transportation;Help with stairs or ramp for entrance    Equipment Recommendations Rolling walker (2 wheels)  Recommendations for Other Services       Functional Status Assessment Patient has had a recent decline in their functional status and/or demonstrates limited ability to make significant improvements in function in a reasonable and predictable amount of time     Precautions / Restrictions Precautions Precautions: Fall Restrictions Weight Bearing Restrictions: No      Mobility  Bed Mobility Overal bed mobility: Needs Assistance Bed Mobility: Supine to Sit, Sit to Supine     Supine to sit: Min assist Sit to supine: Min assist   General bed mobility comments: HHA; PT gives verbal cues for hand placement    Transfers Overall transfer level: Needs assistance Equipment used: Rollator (4 wheels) Transfers: Sit to/from Stand Sit to Stand: Min assist, From elevated surface           General transfer comment: PT gives verbal cues for hand placement; pt reports dizziness while standing and after returning to sitting and supine; BP taken: 146/78    Ambulation/Gait               General Gait Details: Deferred due to pt fatigue and strength deficits  Stairs            Wheelchair Mobility    Modified Rankin (Stroke Patients Only)       Balance Overall balance assessment: History of Falls, Needs assistance Sitting-balance support: No upper extremity supported, Feet supported Sitting balance-Leahy Scale: Fair     Standing balance support: Bilateral upper extremity supported, Reliant on assistive device for balance Standing balance-Leahy Scale: Poor  Pertinent Vitals/Pain Pain Assessment Pain Assessment: No/denies pain    Home Living  Family/patient expects to be discharged to:: Private residence Living Arrangements: Other (Comment) (Roommate) Available Help at Discharge: Family;Available PRN/intermittently Type of Home: House Home Access: Stairs to enter Entrance Stairs-Rails: Psychiatric nurse of Steps: 4   Home Layout: Two level (Reports she only goes on one level of the house) Home Equipment: Rollator (4 wheels);Cane - single point;Grab bars - tub/shower;Grab bars - toilet Additional Comments: SNF recommended    Prior Function Prior Level of Function : Independent/Modified Independent             Mobility Comments: Uses rollator ADLs Comments: Ind ADLs     Hand Dominance        Extremity/Trunk Assessment   Upper Extremity Assessment Upper Extremity Assessment: Generalized weakness    Lower Extremity Assessment Lower Extremity Assessment: Generalized weakness    Cervical / Trunk Assessment Cervical / Trunk Assessment: Kyphotic  Communication   Communication: Expressive difficulties  Cognition Arousal/Alertness: Awake/alert Behavior During Therapy: WFL for tasks assessed/performed Overall Cognitive Status: Impaired/Different from baseline Area of Impairment: Orientation, Following commands, Safety/judgement, Awareness, Problem solving, Memory                 Orientation Level: Disoriented to, Time, Situation (Reports a HA but wasn't sure why)   Memory: Decreased recall of precautions, Decreased short-term memory Following Commands: Follows one step commands with increased time Safety/Judgement: Decreased awareness of safety, Decreased awareness of deficits Awareness: Intellectual Problem Solving: Slow processing, Decreased initiation, Difficulty sequencing, Requires verbal cues          General Comments General comments (skin integrity, edema, etc.): Pt has R sided weakness at baseline    Exercises     Assessment/Plan    PT Assessment Patient needs continued  PT services  PT Problem List Decreased strength;Decreased activity tolerance;Decreased balance;Decreased mobility;Decreased coordination;Decreased cognition;Decreased knowledge of use of DME;Decreased safety awareness;Cardiopulmonary status limiting activity       PT Treatment Interventions DME instruction;Gait training;Stair training;Functional mobility training;Therapeutic activities;Therapeutic exercise;Balance training;Patient/family education;Neuromuscular re-education    PT Goals (Current goals can be found in the Care Plan section)  Acute Rehab PT Goals Patient Stated Goal: Return home PT Goal Formulation: With patient Time For Goal Achievement: 09/15/21 Potential to Achieve Goals: Fair    Frequency Min 2X/week     Co-evaluation               AM-PAC PT "6 Clicks" Mobility  Outcome Measure Help needed turning from your back to your side while in a flat bed without using bedrails?: A Little Help needed moving from lying on your back to sitting on the side of a flat bed without using bedrails?: A Little Help needed moving to and from a bed to a chair (including a wheelchair)?: A Lot Help needed standing up from a chair using your arms (e.g., wheelchair or bedside chair)?: A Little Help needed to walk in hospital room?: Total Help needed climbing 3-5 steps with a railing? : Total 6 Click Score: 13    End of Session Equipment Utilized During Treatment: Gait belt Activity Tolerance: Patient limited by fatigue;Other (comment) (Pt limited by strength deficits) Patient left: in bed;with call bell/phone within reach;with bed alarm set Nurse Communication: Mobility status PT Visit Diagnosis: Unsteadiness on feet (R26.81);Other abnormalities of gait and mobility (R26.89);Muscle weakness (generalized) (M62.81);History of falling (Z91.81);Difficulty in walking, not elsewhere classified (R26.2)    Time: 1000-1037 PT Time Calculation (min) (ACUTE ONLY): 37  min   Charges:   PT  Evaluation $PT Eval Low Complexity: 1 Low          Steen Busing, SPT Acute Rehabilitation Office #: [phone]   Trautmann Busing 09/01/2021, 2:19 PM

## 2021-09-01 NOTE — NC FL2 (Signed)
[place] LEVEL OF CARE SCREENING TOOL     IDENTIFICATION  Patient Name: Melanie Lucas Birthdate: [birth year] Sex: female Admission Date (Current Location): 08/31/2021  [facility] and [place] Number:  Herbalist and Address:  [facility] [place] [place] [address] [place], [place] [address]      Provider Number: [id number]  Attending Physician Name and Address:  Mercy Riding, MD  Relative Name and Phone Number:       Current Level of Care: Hospital Recommended Level of Care: [place] Prior Approval Number:    Date Approved/Denied:   PASRR Number: [id number]  Discharge Plan: SNF    Current Diagnoses: Patient Active Problem List   Diagnosis Date Noted   Malnutrition of moderate degree 09/01/2021   Subdural hemorrhage ([place]) 08/31/2021   Fall at home, initial encounter 08/31/2021   Elevated troponin 08/31/2021   History of cerebrovascular accident (CVA) with residual deficit 08/31/2021   Meniere disease 08/31/2021   Memory loss 08/31/2021   Acquired thrombophilia ([place]) 07/25/2021   Acute ischemic stroke ([place]) 07/24/2021   Nicotine dependence, cigarettes, uncomplicated 31/49/7026   Chronic diastolic CHF (congestive heart failure) ([place]) 03/08/2021   Renal lesion 03/08/2021   Sternal fracture 03/07/2021   Hypokalemia 03/07/2021   CVA (cerebral vascular accident) ([place]) 03/07/2021   Chronic anticoagulation 03/07/2021   Left homonymous hemianopsia 03/06/2021   Stroke-like episode 37/85/8850   Acute metabolic encephalopathy 27/74/1287   Depression 12/31/2017   Stroke ([place]) 12/31/2017   COPD (chronic obstructive pulmonary disease) ([place]) 12/30/2017   Left carotid artery stenosis 07/29/2017   TIA (transient ischemic attack) 06/24/2017   Ischemic stroke ([place]) 06/23/2017   Cervical spondylosis with myelopathy 02/21/2016   Cervical stenosis of spine 11/24/2014   Numbness of fingers of both hands 11/24/2014   Mixed diabetic  hyperlipidemia associated with type 2 diabetes mellitus ([place]) 11/09/2014   Type 2 diabetes mellitus without complication, without long-term current use of insulin ([place]) 11/09/2014   Pulmonary hypertension ([place]) 08/07/2013   Coronary artery disease involving native coronary artery of native heart without angina pectoris 03/11/2013   Peripheral edema 02/25/2012   Essential hypertension, benign 05/06/2009   Overweight 04/13/2009   TOBACCO ABUSE 04/13/2009   Paroxysmal atrial fibrillation ([place]) 04/13/2009    Orientation RESPIRATION BLADDER Height & Weight     Self, Place  Normal Incontinent, External catheter Weight: 137 lb 12.6 oz (62.5 kg) Height:  '5\' 7"'$  (170.2 cm)  BEHAVIORAL SYMPTOMS/MOOD NEUROLOGICAL BOWEL NUTRITION STATUS      Continent Diet (See discharge summary.)  AMBULATORY STATUS COMMUNICATION OF NEEDS Skin   Limited Assist Verbally Normal                       Personal Care Assistance Level of Assistance  Bathing, Dressing, Feeding Bathing Assistance: Maximum assistance Feeding assistance: Limited assistance Dressing Assistance: Limited assistance     Functional Limitations Info  Sight, Hearing Sight Info: Impaired Hearing Info: Impaired      SPECIAL CARE FACTORS FREQUENCY  PT (By licensed PT), OT (By licensed OT)     PT Frequency: 5x/week OT Frequency: 5x/week            Contractures Contractures Info: Not present    Additional Factors Info  Code Status, Allergies, Insulin Sliding Scale Code Status Info: Full code status. Allergies Info: Alendronate Sodium, Avelox, Fosamax, Pradaxa, Statins, Sulfa Antibiotics, Sulfamethoxazole, Sulfonamide Derivates, Varenicline, Ciprofloxacin, Gabapentin, Tape   Insulin Sliding Scale Info: 0-9  mg       Current Medications (09/01/2021):  This is the current hospital active medication list Current Facility-Administered Medications  Medication Dose Route Frequency Provider Last Rate Last Admin   acetaminophen  (TYLENOL) tablet 650 mg  650 mg Oral Q6H PRN Fuller Plan A, MD   650 mg at 08/31/21 1436   Or   acetaminophen (TYLENOL) suppository 650 mg  650 mg Rectal Q6H PRN Fuller Plan A, MD       albuterol (PROVENTIL) (2.5 MG/3ML) 0.083% nebulizer solution 2.5 mg  2.5 mg Nebulization Q6H PRN Fuller Plan A, MD       dofetilide (TIKOSYN) capsule 250 mcg  250 mcg Oral BID Lucrezia Starch, MD   250 mcg at 09/01/21 0919   [START ON 09/02/2021] DULoxetine (CYMBALTA) DR capsule 30 mg  30 mg Oral Daily Gonfa, Taye T, MD       feeding supplement (ENSURE ENLIVE / ENSURE PLUS) liquid 237 mL  237 mL Oral BID BM Smith, Rondell A, MD   237 mL at 09/01/21 1054   furosemide (LASIX) tablet 20 mg  20 mg Oral Daily Smith, Rondell A, MD   20 mg at 09/01/21 1050   insulin aspart (novoLOG) injection 0-9 Units  0-9 Units Subcutaneous TID WC Smith, Rondell A, MD   2 Units at 09/01/21 1210   linaclotide (LINZESS) capsule 290 mcg  290 mcg Oral QAC breakfast Fuller Plan A, MD   290 mcg at 09/01/21 1050   LORazepam (ATIVAN) tablet 0.5 mg  0.5 mg Oral QHS Gonfa, Taye T, MD       losartan (COZAAR) tablet 50 mg  50 mg Oral Daily Smith, Rondell A, MD   50 mg at 09/01/21 1050   magnesium oxide (MAG-OX) tablet 200 mg  200 mg Oral Daily Barrett, Rhonda G, PA-C   200 mg at 09/01/21 1050   melatonin tablet 5 mg  5 mg Oral QHS Gonfa, Taye T, MD       mirabegron ER (MYRBETRIQ) tablet 50 mg  50 mg Oral QHS Smith, Rondell A, MD   50 mg at 08/31/21 2241   multivitamin with minerals tablet 1 tablet  1 tablet Oral Daily Wendee Beavers T, MD   1 tablet at 09/01/21 1339   potassium chloride SA (KLOR-CON M) CR tablet 20 mEq  20 mEq Oral Daily Barrett, Rhonda G, PA-C   20 mEq at 09/01/21 1050   sodium chloride flush (NS) 0.9 % injection 3 mL  3 mL Intravenous Q12H Smith, Rondell A, MD   3 mL at 09/01/21 1100     Discharge Medications: Please see discharge summary for a list of discharge medications.  Relevant Imaging Results:  Relevant  Lab Results:   Additional Information SSN: [id number], [place] 10/09/2019  Benard Halsted, LCSW

## 2021-09-01 NOTE — Progress Notes (Signed)
Her CBG is 397 mg/dL and no sliding scale. Notified Dr. Nevada Crane and received an order for sliding scale to include bedtime. Order already implemented. Will  continue to monitor.

## 2021-09-01 NOTE — Progress Notes (Signed)
PROGRESS NOTE  Melanie Lucas XLK:[id number] DOB: [birth year]   PCP: Kristen Loader, FNP  Patient is from: Home.  Has a roommate.  Uses walker at baseline.  DOA: 08/31/2021 LOS: 0  Chief complaints Chief Complaint  Patient presents with   Fall     Brief Narrative / Interim history: 71 year old F with PMH of CAD/PCI in 2014, multiple CVAs with residual aphasia and cognitive decline, CAS s/p left CEA, diastolic CHF, PAF on Eliquis, Mnire's disease (deaf in right ear and diminished hearing in left ear), anxiety, insomnia and tobacco use disorder brought to ED after fall at home.  Daughter saw her on the floor in the living room, and thinks she has fallen while trying to change the bed sheets and likely hit her head.  Some bruising on elbows and legs.  In ED, vital stable.  CMP and CBC without significant finding.  UA with moderate LE, > 500 glucose.  CT with acute right subdural hematoma measuring 1 cm without significant mass effect.  See CT cervical spine without acute finding.  CT abdomen and pelvis with bladder wall thickening.  Troponin 36>> 34.  EKG sinus rhythm with supraventricular bigeminy and RBBB (old).  Neurosurgery and cardiology consulted.  Neurosurgery recommended remaining off anticoagulation for several months until the hematoma completely resolves.  Cardiology consulted and concurs with neurosurgery recommendation and recommended continuing Tikosyn.  Therapy recommended SNF.  Subjective: Seen and examined earlier this morning.  No major events overnight of this morning.  Patient is sleepy but wakes to voice.  She has no complaints but has aphasia to express herself fully.  She responds no to pain.  She follows commands.  She is oriented to self and place.  Objective: Vitals:   09/01/21 0400 09/01/21 0436 09/01/21 0500 09/01/21 0751  BP: (!) 165/77   (!) 167/85  Pulse:    74  Resp: '16 17 17 16  '$ Temp: 98.2 F (36.8 C)   97.9 F (36.6 C)  TempSrc: Oral   Oral  SpO2:  96%   99%  Weight:   62.5 kg   Height:        Examination:  GENERAL: No apparent distress.  Nontoxic. HEENT: MMM.  Vision and hearing grossly intact.  NECK: Supple.  No apparent JVD.  RESP:  No IWOB.  Fair aeration bilaterally. CVS:  RRR. Heart sounds normal.  ABD/GI/GU: BS+. Abd soft, NTND.  MSK/EXT:  Moves extremities.  RLE>> LLE.  Faint DP pulse in the right. SKIN: Small bruise below left knee and over left elbow. NEURO: Awake, alert and oriented to self and place.  Follows commands.  Expressive aphasia.  Patellar reflex 2+ in the left and 1+ in the right. PSYCH: Calm. Normal affect.   Procedures:  None  Microbiology summarized: None  Assessment and plan: Principal Problem:   Subdural hemorrhage (HCC) Active Problems:   Fall at home, initial encounter   Paroxysmal atrial fibrillation (HCC)   Chronic anticoagulation   Elevated troponin   History of cerebrovascular accident (CVA) with residual deficit   Chronic diastolic CHF (congestive heart failure) (HCC)   Essential hypertension, benign   TOBACCO ABUSE   Type 2 diabetes mellitus without complication, without long-term current use of insulin (HCC)   COPD (chronic obstructive pulmonary disease) (HCC)   Meniere disease   Memory loss   Malnutrition of moderate degree   Acute subdural hemorrhage due to unwitnessed fall at home-patient has ambulatory dysfunction uses walker at baseline.  Reportedly had recurrent fall  prior to this.  She lives with a roommate but she was by herself when she fell.  CT shows 1 cm right subdural hematoma without mass effect.  CT cervical spine without acute finding.  She has history of PAF but in sinus rhythm.  No major events on telemetry.  Low suspicion for infectious process or seizure.  She is on diazepam which could increase her risk of fall.  She had multiple CVAs in the past.  Basic labs including CMP and CBC without significant finding. -Appreciate input by neurosurgery-no  anticoagulation for months until subdural hematoma resolves -Appreciate help by cardiology -Minimize sedating medication. -Fall precaution -PT/OT eval  Paroxysmal atrial fibrillation on chronic anticoagulation: CHA2DS2-VASc score 7-8. -Continue Tikosyn per cardiology -Optimize electrolytes -Hold anticoagulation   Elevated troponin/history of CAD: Likely demand ischemia.  No delta.  No acute ischemic finding on EKG -Continue home meds   History of CVA with residual deficit: Had right occipital CVA in January and left basal ganglia and corona radiata CVA in June.  She has expressive aphasia, some cognitive impairment and ambulatory dysfunction.  -Continue home medications except Eliquis.  Chronic diastolic CHF: TTE in 7/56 with LVEF of 55 to 60%, G1 DD.  Appears euvolemic on exam except for BLE edema, right> left.  On p.o. Lasix at home. -Continue home Lasix  Essential hypertension: Normotensive. -Continue furosemide and losartan   Memory loss: Possible vascular dementia from multiple CVAs -Reorientation and delirium precaution -Minimize sedating medications  Anxiety/insomnia: On Cymbalta, Valium and melatonin.  Discussed my concern about her Valium with patient's daughter over the phone.  We have agreed to try low-dose Ativan at night as needed for sleep and stop Valium, and change Cymbalta to morning.  Also added melatonin 5 mg at night.  Daughter likes to hold off BuSpar for now.    NIDDM-2 with hyperglycemia: A1c 7.3% on 6/6.  On Synjardy and Prandin at home. Recent Labs  Lab 08/31/21 0947 08/31/21 1823 08/31/21 2333 09/01/21 0750 09/01/21 1155  GLUCAP 124* 155* 136* 119* 193*  -Continue SSI-sensitive -Add mealtime coverage 2 units 3 times daily -Has history of statin intolerance.   Chronic COPD: Stable. -Nebs as needed   Mnire's disease: Reportedly deaf in right ear and diminished hearing in left ear.  Pyuria: Low suspicion for UTI.  Moderate malnutrition Body  mass index is 21.58 kg/m. Nutrition Problem: Moderate Malnutrition Etiology: chronic illness (COPD, CHF, multiple strokes) Signs/Symptoms: mild fat depletion, moderate muscle depletion Interventions: Ensure Enlive (each supplement provides 350kcal and 20 grams of protein), MVI   DVT prophylaxis:  SCDs Start: 08/31/21 1354  Code Status: Full code Family Communication: Updated patient's daughter over the phone. Level of care: Telemetry Medical Status is: Observation The patient remains OBS appropriate and will d/c before 2 midnights.   Final disposition: SNF Consultants:  Neurosurgery Cardiology  Sch Meds:  Scheduled Meds:  dofetilide  250 mcg Oral BID   [START ON 09/02/2021] DULoxetine  30 mg Oral Daily   feeding supplement  237 mL Oral BID BM   furosemide  20 mg Oral Daily   insulin aspart  0-9 Units Subcutaneous TID WC   linaclotide  290 mcg Oral QAC breakfast   LORazepam  0.5 mg Oral QHS   losartan  50 mg Oral Daily   magnesium oxide  200 mg Oral Daily   melatonin  5 mg Oral QHS   mirabegron ER  50 mg Oral QHS   multivitamin with minerals  1 tablet Oral Daily  potassium chloride SA  20 mEq Oral Daily   sodium chloride flush  3 mL Intravenous Q12H   Continuous Infusions: PRN Meds:.acetaminophen **OR** acetaminophen, albuterol  Antimicrobials: Anti-infectives (From admission, onward)    None        I have personally reviewed the following labs and images: CBC: Recent Labs  Lab 08/31/21 1007 09/01/21 0111  WBC 9.9 8.3  NEUTROABS 8.0*  --   HGB 14.5 14.3  HCT 42.4 41.2  MCV 91.8 90.4  PLT 215 207   BMP &GFR Recent Labs  Lab 08/31/21 1007 08/31/21 1202 09/01/21 0111  NA 142  --  141  K 3.9  --  3.5  CL 106  --  104  CO2 26  --  27  GLUCOSE 118*  --  124*  BUN 12  --  13  CREATININE 0.78  --  0.68  CALCIUM 8.9  --  8.7*  MG  --  1.8  --    Estimated Creatinine Clearance: 60.9 mL/min (by C-G formula based on SCr of 0.68 mg/dL). Liver &  Pancreas: Recent Labs  Lab 08/31/21 1007  AST 20  ALT 20  ALKPHOS 80  BILITOT 0.9  PROT 5.8*  ALBUMIN 3.1*   No results for input(s): "LIPASE", "AMYLASE" in the last 168 hours. No results for input(s): "AMMONIA" in the last 168 hours. Diabetic: No results for input(s): "HGBA1C" in the last 72 hours. Recent Labs  Lab 08/31/21 0947 08/31/21 1823 08/31/21 2333 09/01/21 0750 09/01/21 1155  GLUCAP 124* 155* 136* 119* 193*   Cardiac Enzymes: Recent Labs  Lab 08/31/21 1007  CKTOTAL 127   No results for input(s): "PROBNP" in the last 8760 hours. Coagulation Profile: No results for input(s): "INR", "PROTIME" in the last 168 hours. Thyroid Function Tests: No results for input(s): "TSH", "T4TOTAL", "FREET4", "T3FREE", "THYROIDAB" in the last 72 hours. Lipid Profile: No results for input(s): "CHOL", "HDL", "LDLCALC", "TRIG", "CHOLHDL", "LDLDIRECT" in the last 72 hours. Anemia Panel: No results for input(s): "VITAMINB12", "FOLATE", "FERRITIN", "TIBC", "IRON", "RETICCTPCT" in the last 72 hours. Urine analysis:    Component Value Date/Time   COLORURINE YELLOW (A) 09/01/2021 0517   APPEARANCEUR CLOUDY (A) 09/01/2021 0517   LABSPEC 1.026 09/01/2021 0517   PHURINE 6.0 09/01/2021 0517   GLUCOSEU >=500 (A) 09/01/2021 0517   HGBUR SMALL (A) 09/01/2021 0517   BILIRUBINUR NEGATIVE 09/01/2021 0517   BILIRUBINUR negative 10/17/2015 1839   KETONESUR 20 (A) 09/01/2021 0517   PROTEINUR 30 (A) 09/01/2021 0517   UROBILINOGEN 0.2 10/17/2015 1839   UROBILINOGEN 1.0 09/20/2010 0025   NITRITE NEGATIVE 09/01/2021 0517   LEUKOCYTESUR MODERATE (A) 09/01/2021 0517   Sepsis Labs: Invalid input(s): "PROCALCITONIN", "LACTICIDVEN"  Microbiology: No results found for this or any previous visit (from the past 240 hour(s)).  Radiology Studies: No results found.    Zebadiah Willert T. Hardin  If 7PM-7AM, please contact night-coverage [email or web address] 09/01/2021, 2:27 PM

## 2021-09-01 NOTE — Care Management Obs Status (Signed)
[place] NOTIFICATION   Patient Details  Name: Melanie Lucas MRN: [id number] Date of Birth: [birth year]   Medicare Observation Status Notification Given:  Yes    Benard Halsted, LCSW 09/01/2021, 3:55 PM

## 2021-09-01 NOTE — Progress Notes (Signed)
Patient's  urine is very very cloudy. Notified Dr. Nevada Crane and received and order for U/A.

## 2021-09-01 NOTE — Progress Notes (Signed)
Initial Nutrition Assessment  DOCUMENTATION CODES:   Non-severe (moderate) malnutrition in context of chronic illness  INTERVENTION:   Multivitamin w/ minerals daily Ensure Enlive po BID, each supplement provides 350 kcal and 20 grams of protein. Liberalize pt diet to regular due to malnutrition.  Meal ordering with assist Encourage good PO intake Feeding assist with all meals  NUTRITION DIAGNOSIS:   Moderate Malnutrition related to chronic illness (COPD, CHF, multiple strokes) as evidenced by mild fat depletion, moderate muscle depletion.  GOAL:   Patient will meet greater than or equal to 90% of their needs  MONITOR:   PO intake, Supplement acceptance, I & O's, Weight trends, Labs  REASON FOR ASSESSMENT:   Malnutrition Screening Tool    ASSESSMENT:   71 y.o. female presented to the ED after several falls at home. PMH includes HTN, COPD, CHF, T2DM, TIA, CVA, and Meniere disease. Pt admitted with subdural hemorrhage.   Pt disoriented at time of RD visit. Unable to provide any nutrition information at this time.   Per EMR, pt has had 14% weight loss within 7 months, which is clinically significant  for time frame. Although, unable to determine actual dry weight loss versus fluid loss. Pt noted to be on Lasix daily at home prior to admission.   Medications reviewed and include: Lasix, Magnesium Oxide, Potassium Chloride  Labs reviewed.   NUTRITION - FOCUSED PHYSICAL EXAM:  Flowsheet Row Most Recent Value  Orbital Region Mild depletion  Upper Arm Region Mild depletion  Thoracic and Lumbar Region Mild depletion  Buccal Region Mild depletion  Temple Region Mild depletion  Clavicle Bone Region Moderate depletion  Clavicle and Acromion Bone Region Moderate depletion  Scapular Bone Region Moderate depletion  Dorsal Hand Severe depletion  Patellar Region Severe depletion  Anterior Thigh Region Severe depletion  Posterior Calf Region Severe depletion  Edema (RD  Assessment) None  Hair Reviewed  Eyes Reviewed  Mouth Reviewed  Skin Reviewed  Nails Unable to assess  [Nail Polish]   Diet Order:   Diet Order             Diet heart healthy/carb modified Room service appropriate? Yes; Fluid consistency: Thin  Diet effective now                   EDUCATION NEEDS:   No education needs have been identified at this time  Skin:  Skin Assessment: Reviewed RN Assessment  Last BM:  7/10  Height:   Ht Readings from Last 1 Encounters:  08/31/21 '5\' 7"'$  (1.702 m)    Weight:   Wt Readings from Last 1 Encounters:  09/01/21 62.5 kg    Ideal Body Weight:  61.4 kg  BMI:  Body mass index is 21.58 kg/m.  Estimated Nutritional Needs:   Kcal:  1900-2100  Protein:  95-110 grams  Fluid:  >/= 1.9 L    Hermina Barters RD, LDN Clinical Dietitian See [facility] [place] for contact information.

## 2021-09-01 NOTE — TOC CAGE-AID Note (Signed)
Transition of Care [facility]) - CAGE-AID Screening   Patient Details  Name: Melanie Lucas MRN: [id number] Date of Birth: 12-17-1950  [facility]) CM/SW Contact:    Coralee Pesa, [place] Phone Number: 09/01/2021, 8:35 AM   Clinical Narrative: Per chart review, pt is disoriented and forgetful at this time, will defer assessment until pt is more appropriate.   CAGE-AID Screening: Substance Abuse Screening unable to be completed due to: : Patient unable to participate             Substance Abuse Education Offered: No

## 2021-09-01 NOTE — TOC Initial Note (Addendum)
Transition of Care [facility]) - Initial/Assessment Note    Patient Details  Name: Melanie Lucas MRN: [id number] Date of Birth: [birth year]  Transition of Care [facility] Lucas) CM/SW Contact:    Melanie Halsted, LCSW Phone Number: 09/01/2021, 3:46 PM  Clinical Narrative:                 3:46pm-CSW received consult for possible SNF placement at time of discharge. CSW spoke with patient's daughter, Melanie Lucas. She reported that patient lives on the bottom floor with a roommate. She went to [facility] in January and again in June (has used 13 days with Medicare). She stated that patient would do better at home if she can arrange care there as she normally gets to the facility and wants to walk and facility will not let her. She believes patient has a Building control surveyor for care in the home but she does not know where the information is. CSW emailed her the Medicare SNF ratings list as requested. Patient has received COVID vaccines. She is going to try to call the state plan to see if they have any information on the patient. CSW advised that due to patient being in observation status that time will be limited to create a plan.   4:54pm-CSW received return call from daughter and she is requesting Melanie Lucas and Melanie Lucas review referral because she is not certain the LTC agency will have an answer for her in time for discharge. CSW requested facilities review referral.   [place]-   [email or web address]   Ratings out of 5 possible   Name Address  Phone # [place] Inspection Overall  [facility] [address] [place] '4 5 2 3  '$ [facility]  [address] [place], [place] [phone] '3 2 5 5  '$ [facility] [place], [place] '3 1 1 1  '$ Vian [place], [place] '3 2 4 4  '$ [facility] [place] [address] [place] '1 1 2 1  '$ [place], [place] [phone] '2 1 4 3  '$ [facility] [address], [place] '5 2 3 4  '$ [facility] [address], [place] '5 2 2 3  '$ [address] ([place]) [place], [place] [phone] '5 1 2 2  '$ [facility] [address], [place] [phone] '4 1 2 1  '$ [facility] [address], [facility] [phone] '4 1 2 1  '$ [facility] [place]. [place], [place] [phone] '4 1 1 1  '$ Melanie Lucas [address] [phone] '3 2 4 4          '$ [place] '4 2 3 3  '$ [facility] [place] [address] [place] '4 1 5 4  '$ [place] [address] [place] [address], [place] [phone] '2 1 1 1  '$ [facility] [address], [employer] [phone] '2 1 3 2          '$ [address] (no [facility]) [place] [address], [place] [phone] '4 5 5 5  '$ [facility] (No Humana) [address] [place], [place] '3 1 4 3  '$ [facility] (No UHC) [place], [place] [phone] '5 5 5 5  '$ [facility] [address], [employer] [phone] '3 2 4 4  '$ [place]  [place] [address] [place] '1 1 2 1  '$ [facility] [address] [place] [phone] '2 1 1 1  '$ [place], [place] '5 2 4 5  '$ [facility] [address] [place] '3 1 1 1  '$ [place] [facility] [place], [place] '2 1 2 1          '$ [facility] [address], [place] '1 1 1 1  '$ [place] [address] [place]  [phone] '2 4 2 2  '$ [facility] [place] [address], [place] [phone] '5 2 3 4  '$ [place], [place] '2 1 1 1  '$ [place] (No Humana) [address] [place] '2 1 3 2  '$ [facility] [address], [place] [phone] '3 1 1 1          '$ [facility] [place], [place] '5 4 5 5  '$ [facility])  [address], [place] '2 2 3 3  '$ [facility]) [place] [address] [place] [phone] '3 2 4 4  '$ [place] [address], [place] '4 3 4 4  '$ [address] [place], [place] '3 3 1 1  '$ [facility]) [address] [place] [phone] '2 2 4 4       '$ Barriers to Discharge: Continued Medical Work up   Patient Goals and CMS Choice Patient states their goals for this hospitalization and ongoing recovery are:: Return home CMS Medicare.gov Compare Post Acute Care list provided to:: Patient Represenative (must comment) Choice offered to / list presented to : Adult Children  Expected Discharge Plan and Services   In-house Referral: Clinical Social Work     Living arrangements for the past 2 months: Single Family Home                                      Prior Living Arrangements/Services Living arrangements for the past 2 months: Single Family Home Lives with:: Roommate (she stays on bottom level) Patient language and need for interpreter reviewed:: Yes Do you feel safe going back to the place where you live?: Yes      Need for Family Participation in Patient Care: Yes (Comment) Care giver support system in place?: Yes (comment) Current home services: DME Melanie Lucas) Criminal Activity/Legal Involvement Pertinent to Current Situation/Hospitalization: No - Comment as needed  Activities of Daily Living      Permission Sought/Granted Permission sought to share information with : Facility Sport and exercise psychologist, Family Supports Permission granted to share information with : Yes, Verbal Permission Granted  Share Information with NAME: Melanie Lucas     Permission granted to share info w  Relationship: Daughter  Permission granted to share info w Contact Information: [phone]  Emotional Assessment Appearance:: Appears stated age Attitude/Demeanor/Rapport: Unable to Assess Affect (typically observed): Unable to Assess Orientation: : Oriented to Self, Oriented to Place Alcohol / Substance Use: Not Applicable Psych Involvement: No (comment)  Admission diagnosis:  Subdural hematoma ([place]) [S06.5XAA] SDH (subdural hematoma) ([place]) [S06.5XAA] Atrial fibrillation, unspecified type [facility]) [I48.91] Patient Active Problem List   Diagnosis Date Noted   Malnutrition of moderate degree 09/01/2021   Subdural hemorrhage ([place]) 08/31/2021   Fall at home, initial encounter 08/31/2021   Elevated troponin 08/31/2021  History of cerebrovascular accident (CVA) with residual deficit 08/31/2021   Meniere disease 08/31/2021   Memory loss 08/31/2021   Acquired thrombophilia ([place]) 07/25/2021   Acute ischemic stroke ([place]) 07/24/2021   Nicotine dependence, cigarettes, uncomplicated 09/81/1914   Chronic diastolic CHF (congestive heart failure) ([place]) 03/08/2021   Renal lesion 03/08/2021   Sternal fracture 03/07/2021   Hypokalemia 03/07/2021   CVA (cerebral vascular accident) ([place]) 03/07/2021   Chronic anticoagulation 03/07/2021   Left homonymous hemianopsia 03/06/2021   Stroke-like episode 78/29/5621   Acute metabolic encephalopathy 30/86/5784   Depression 12/31/2017   Stroke ([place]) 12/31/2017   COPD (chronic obstructive pulmonary disease) ([place]) 12/30/2017   Left carotid artery stenosis 07/29/2017   TIA (transient ischemic attack) 06/24/2017   Ischemic stroke ([place]) 06/23/2017   Cervical spondylosis with myelopathy 02/21/2016   Cervical stenosis of spine 11/24/2014   Numbness of fingers of both hands 11/24/2014   Mixed diabetic hyperlipidemia associated with type 2 diabetes mellitus ([place]) 11/09/2014   Type 2 diabetes mellitus without complication, without long-term current use of  insulin ([place]) 11/09/2014   Pulmonary hypertension ([place]) 08/07/2013   Coronary artery disease involving native coronary artery of native heart without angina pectoris 03/11/2013   Peripheral edema 02/25/2012   Essential hypertension, benign 05/06/2009   Overweight 04/13/2009   TOBACCO ABUSE 04/13/2009   Paroxysmal atrial fibrillation ([place]) 04/13/2009   PCP:  Kristen Loader, FNP Pharmacy:   [id number] [place], [place] [address]Phone: [phone]: 3[place] [facility]Phone: [phone]: [phone]    Social Determinants of Health (SDOH) Interventions    Readmission Risk Interventions    08/01/2021   10:59 AM  Readmission Risk Prevention Plan  Transportation Screening Complete  PCP or Specialist Appt within 5-7 Days Complete  Home Care Screening Complete  Medication Review (RN CM) Complete

## 2021-09-02 DIAGNOSIS — I6930 Unspecified sequelae of cerebral infarction: Secondary | ICD-10-CM | POA: Diagnosis not present

## 2021-09-02 DIAGNOSIS — I6200 Nontraumatic subdural hemorrhage, unspecified: Secondary | ICD-10-CM | POA: Diagnosis not present

## 2021-09-02 DIAGNOSIS — J449 Chronic obstructive pulmonary disease, unspecified: Secondary | ICD-10-CM | POA: Diagnosis not present

## 2021-09-02 DIAGNOSIS — I10 Essential (primary) hypertension: Secondary | ICD-10-CM | POA: Diagnosis not present

## 2021-09-02 DIAGNOSIS — R778 Other specified abnormalities of plasma proteins: Secondary | ICD-10-CM | POA: Diagnosis not present

## 2021-09-02 DIAGNOSIS — I5032 Chronic diastolic (congestive) heart failure: Secondary | ICD-10-CM | POA: Diagnosis not present

## 2021-09-02 DIAGNOSIS — I480 Paroxysmal atrial fibrillation: Secondary | ICD-10-CM | POA: Diagnosis not present

## 2021-09-02 DIAGNOSIS — W19XXXA Unspecified fall, initial encounter: Secondary | ICD-10-CM | POA: Diagnosis not present

## 2021-09-02 DIAGNOSIS — R413 Other amnesia: Secondary | ICD-10-CM

## 2021-09-02 DIAGNOSIS — Z7901 Long term (current) use of anticoagulants: Secondary | ICD-10-CM | POA: Diagnosis not present

## 2021-09-02 LAB — BASIC METABOLIC PANEL
Anion gap: 11 (ref 5–15)
BUN: 18 mg/dL (ref 8–23)
CO2: 24 mmol/L (ref 22–32)
Calcium: 8.8 mg/dL — ABNORMAL LOW (ref 8.9–10.3)
Chloride: 105 mmol/L (ref 98–111)
Creatinine, Ser: 0.57 mg/dL (ref 0.44–1.00)
GFR, Estimated: >60 mL/min (ref >60)
Glucose, Bld: 115 mg/dL — ABNORMAL HIGH (ref 70–99)
Potassium: 3.9 mmol/L (ref 3.5–5.1)
Sodium: 140 mmol/L (ref 135–145)

## 2021-09-02 LAB — GLUCOSE, CAPILLARY
Glucose-Capillary: 397 mg/dL — ABNORMAL HIGH (ref 70–99)
Glucose-Capillary: 157 mg/dL — ABNORMAL HIGH (ref 70–99)
Glucose-Capillary: 176 mg/dL — ABNORMAL HIGH (ref 70–99)
Glucose-Capillary: 258 mg/dL — ABNORMAL HIGH (ref 70–99)
Glucose-Capillary: 210 mg/dL — ABNORMAL HIGH (ref 70–99)

## 2021-09-02 MED ORDER — INSULIN ASPART 100 UNIT/ML IJ SOLN
0.0000 [IU] | Freq: Three times a day (TID) | INTRAMUSCULAR | Status: DC
  Administered 2021-09-03: 5 [IU] via SUBCUTANEOUS
  Administered 2021-09-03: 3 [IU] via SUBCUTANEOUS
  Administered 2021-09-03: 8 [IU] via SUBCUTANEOUS
  Administered 2021-09-04 (×2): 5 [IU] via SUBCUTANEOUS
  Administered 2021-09-04: 3 [IU] via SUBCUTANEOUS

## 2021-09-02 MED ORDER — INSULIN ASPART 100 UNIT/ML IJ SOLN
0.0000 [IU] | Freq: Every day | INTRAMUSCULAR | Status: DC
  Administered 2021-09-02 – 2021-09-03 (×2): 2 [IU] via SUBCUTANEOUS

## 2021-09-02 MED ORDER — QUETIAPINE FUMARATE 25 MG PO TABS
12.5000 mg | ORAL_TABLET | Freq: Every day | ORAL | Status: DC
  Administered 2021-09-02 – 2021-09-03 (×2): 12.5 mg via ORAL
  Filled 2021-09-02 (×2): qty 1

## 2021-09-02 NOTE — Progress Notes (Addendum)
PROGRESS NOTE  Melanie Lucas TIR:[id number] DOB: 02/21/1950   PCP: Kristen Loader, FNP  Patient is from: Home.  Has a roommate.  Uses walker at baseline.  DOA: 08/31/2021 LOS: 0  Chief complaints Chief Complaint  Patient presents with   Fall     Brief Narrative / Interim history: 71 year old F with PMH of CAD/PCI in 2014, multiple CVAs with residual aphasia and cognitive decline, CAS s/p left CEA, diastolic CHF, PAF on Eliquis, Mnire's disease (deaf in right ear and diminished hearing in left ear), anxiety, insomnia and tobacco use disorder brought to ED after fall at home.  Daughter saw her on the floor in the living room, and thinks she has fallen while trying to change the bed sheets and likely hit her head.  Some bruising on elbows and legs.  In ED, vital stable.  CMP and CBC without significant finding.  UA with moderate LE, > 500 glucose.  CT with acute right subdural hematoma measuring 1 cm without significant mass effect.  See CT cervical spine without acute finding.  CT abdomen and pelvis with bladder wall thickening.  Troponin 36>> 34.  EKG sinus rhythm with supraventricular bigeminy and RBBB (old).  Neurosurgery and cardiology consulted.  Neurosurgery recommended remaining off anticoagulation for several months until the hematoma completely resolves.  cardiology consulted and concurs with neurosurgery recommendation and recommended continuing Tikosyn.  Therapy recommended SNF.  Subjective: Seen and examined earlier this morning.  Objective: Vitals:   09/02/21 0407 09/02/21 0849 09/02/21 1219 09/02/21 1619  BP:  (!) 152/76 (!) 167/89 (!) 159/74  Pulse: (!) 57 64 61 67  Resp: '14 15 19 20  '$ Temp:  98.3 F (36.8 C) 98.6 F (37 C) 99 F (37.2 C)  TempSrc:  Oral Oral Oral  SpO2: 93% 95% 95% 92%  Weight: 65.3 kg     Height:        Examination:  GENERAL: No apparent distress.  Nontoxic. HEENT: MMM.  Vision and hearing grossly intact.  NECK: Supple.  No apparent JVD.   RESP:  No IWOB.  Fair aeration bilaterally. CVS:  RRR. Heart sounds normal.  ABD/GI/GU: BS+. Abd soft, NTND.  MSK/EXT:  Moves extremities.  LLE weaker than RLE.  Trace BLE edema SKIN: no apparent skin lesion or wound NEURO: Awake and alert. Oriented to self, place, person, year and president.  Motor 3+/5 in LLE and 4/5 in RLE. PSYCH: Calm. Normal affect.   Procedures:  None  Microbiology summarized: None  Assessment and plan: Principal Problem:   Subdural hemorrhage (HCC) Active Problems:   Fall at home, initial encounter   Paroxysmal atrial fibrillation (HCC)   Chronic anticoagulation   Elevated troponin   History of cerebrovascular accident (CVA) with residual deficit   Chronic diastolic CHF (congestive heart failure) (HCC)   Essential hypertension, benign   TOBACCO ABUSE   Type 2 diabetes mellitus without complication, without long-term current use of insulin (HCC)   COPD (chronic obstructive pulmonary disease) (HCC)   Meniere disease   Memory loss   Malnutrition of moderate degree   Acute subdural hemorrhage due to unwitnessed fall at home-patient has ambulatory dysfunction uses walker at baseline.  Reportedly had recurrent fall prior to this.  She lives with a roommate but she was by herself when she fell.  CT shows 1 cm right subdural hematoma without mass effect.  CT cervical spine without acute finding.  She has history of PAF but in sinus rhythm.  No major events on telemetry.  Low suspicion for infectious process or seizure.  She is on diazepam which could increase her risk of fall.  She had multiple CVAs in the past.  Basic labs including CMP and CBC without significant finding. -Neurosurgery-no AC form on this until SDH resolves, repeat CT in a month and outpatient follow-up -Minimize sedating medication and benzo. -Changed Valium to low-dose Seroquel at night after discussion with patient's daughter. -Fall precaution -PT/OT eval  Paroxysmal atrial fibrillation  on chronic anticoagulation: CHA2DS2-VASc score 7-8. -Continue Tikosyn -Optimize electrolytes -Hold anticoagulation -Appreciate help by cardiology   Elevated troponin/history of CAD: Likely demand ischemia.  No delta.  No acute ischemic finding on EKG -Continue home meds   History of CVA with residual deficit: Had right occipital CVA in January and left basal ganglia and corona radiata CVA in June.  She has expressive aphasia, some cognitive impairment and ambulatory dysfunction.  -Continue home medications except Eliquis.  Chronic diastolic CHF: TTE in 6/54 with LVEF of 55 to 60%, G1-DD.  Appears euvolemic on exam except for BLE edema, right> left.  On p.o. Lasix at home. -Continue home Lasix  Essential hypertension: Normotensive. -Continue furosemide and losartan   Memory loss: Possible vascular dementia from multiple CVAs -Reorientation and delirium precaution -Minimize sedating medications  Anxiety/insomnia: On Cymbalta, Valium and melatonin.  Discussed my concern about her Valium with patient's daughter over the phone.   -Change Cymbalta to morning -Changed Valium to low-dose Seroquel at night after discussion with patient's daughter -Minimize or avoid benzos   NIDDM-2 with hyperglycemia: A1c 7.3% on 6/6.  On Synjardy and Prandin at home. Recent Labs  Lab 09/01/21 1605 09/01/21 2101 09/02/21 0852 09/02/21 1224 09/02/21 1609  GLUCAP 187* 397* 157* 176* 258*  -Increase SSI to moderate -Has history of statin intolerance.   Chronic COPD: Stable. -Nebs as needed   Mnire's disease: Reportedly deaf in right ear and diminished hearing in left ear.  Pyuria: Low suspicion for UTI.  Moderate malnutrition Body mass index is 22.55 kg/m. Nutrition Problem: Moderate Malnutrition Etiology: chronic illness (COPD, CHF, multiple strokes) Signs/Symptoms: mild fat depletion, moderate muscle depletion Interventions: Ensure Enlive (each supplement provides 350kcal and 20 grams of  protein), MVI   DVT prophylaxis:  SCDs Start: 08/31/21 1354  Code Status: Full code Family Communication: Updated patient's daughter over the phone. Level of care: Telemetry Medical Status is: Observation The patient remains OBS appropriate and will d/c before 2 midnights.   Final disposition: SNF Consultants:  Neurosurgery-signed off Cardiology-signed off  Sch Meds:  Scheduled Meds:  dofetilide  250 mcg Oral BID   DULoxetine  30 mg Oral Daily   feeding supplement  237 mL Oral BID BM   furosemide  20 mg Oral Daily   [START ON 09/03/2021] insulin aspart  0-15 Units Subcutaneous TID WC   insulin aspart  0-5 Units Subcutaneous QHS   linaclotide  290 mcg Oral QAC breakfast   losartan  50 mg Oral Daily   magnesium oxide  200 mg Oral Daily   melatonin  5 mg Oral QHS   mirabegron ER  50 mg Oral QHS   multivitamin with minerals  1 tablet Oral Daily   potassium chloride SA  20 mEq Oral Daily   QUEtiapine  12.5 mg Oral QHS   sodium chloride flush  3 mL Intravenous Q12H   Continuous Infusions: PRN Meds:.acetaminophen **OR** acetaminophen, albuterol  Antimicrobials: Anti-infectives (From admission, onward)    None        I have personally reviewed  the following labs and images: CBC: Recent Labs  Lab 08/31/21 1007 09/01/21 0111  WBC 9.9 8.3  NEUTROABS 8.0*  --   HGB 14.5 14.3  HCT 42.4 41.2  MCV 91.8 90.4  PLT 215 207   BMP &GFR Recent Labs  Lab 08/31/21 1007 08/31/21 1202 09/01/21 0111 09/02/21 0128  NA 142  --  141 140  K 3.9  --  3.5 3.9  CL 106  --  104 105  CO2 26  --  27 24  GLUCOSE 118*  --  124* 115*  BUN 12  --  13 18  CREATININE 0.78  --  0.68 0.57  CALCIUM 8.9  --  8.7* 8.8*  MG  --  1.8  --   --    Estimated Creatinine Clearance: 60.9 mL/min (by C-G formula based on SCr of 0.57 mg/dL). Liver & Pancreas: Recent Labs  Lab 08/31/21 1007  AST 20  ALT 20  ALKPHOS 80  BILITOT 0.9  PROT 5.8*  ALBUMIN 3.1*   No results for input(s):  "LIPASE", "AMYLASE" in the last 168 hours. No results for input(s): "AMMONIA" in the last 168 hours. Diabetic: No results for input(s): "HGBA1C" in the last 72 hours. Recent Labs  Lab 09/01/21 1605 09/01/21 2101 09/02/21 0852 09/02/21 1224 09/02/21 1609  GLUCAP 187* 397* 157* 176* 258*   Cardiac Enzymes: Recent Labs  Lab 08/31/21 1007  CKTOTAL 127   No results for input(s): "PROBNP" in the last 8760 hours. Coagulation Profile: No results for input(s): "INR", "PROTIME" in the last 168 hours. Thyroid Function Tests: No results for input(s): "TSH", "T4TOTAL", "FREET4", "T3FREE", "THYROIDAB" in the last 72 hours. Lipid Profile: No results for input(s): "CHOL", "HDL", "LDLCALC", "TRIG", "CHOLHDL", "LDLDIRECT" in the last 72 hours. Anemia Panel: No results for input(s): "VITAMINB12", "FOLATE", "FERRITIN", "TIBC", "IRON", "RETICCTPCT" in the last 72 hours. Urine analysis:    Component Value Date/Time   COLORURINE YELLOW (A) 09/01/2021 0517   APPEARANCEUR CLOUDY (A) 09/01/2021 0517   LABSPEC 1.026 09/01/2021 0517   PHURINE 6.0 09/01/2021 0517   GLUCOSEU >=500 (A) 09/01/2021 0517   HGBUR SMALL (A) 09/01/2021 0517   BILIRUBINUR NEGATIVE 09/01/2021 0517   BILIRUBINUR negative 10/17/2015 1839   KETONESUR 20 (A) 09/01/2021 0517   PROTEINUR 30 (A) 09/01/2021 0517   UROBILINOGEN 0.2 10/17/2015 1839   UROBILINOGEN 1.0 09/20/2010 0025   NITRITE NEGATIVE 09/01/2021 0517   LEUKOCYTESUR MODERATE (A) 09/01/2021 0517   Sepsis Labs: Invalid input(s): "PROCALCITONIN", "LACTICIDVEN"  Microbiology: No results found for this or any previous visit (from the past 240 hour(s)).  Radiology Studies: No results found.    Keyondra Lagrand T. [place]  If 7PM-7AM, please contact night-coverage [email or web address] 09/02/2021, 5:08 PM

## 2021-09-02 NOTE — Care Management (Addendum)
16:05 LVM w daughter requesting callback to discuss discharge planning.  Bramer,[name] Daughter [phone]  [phone]  16:15 spoke w daughter and she feels SNF is the safest option at this time for short term rehab to regain strength prior to going back home. She confirms that she would like [facility] or [facility]. [place], CSW updated.

## 2021-09-02 NOTE — Progress Notes (Signed)
Patient ID: Melanie Lucas, female   DOB: 08/20/1950, 71 y.o.   MRN: [id number] Patient's vital signs are stable Today she is somewhat disoriented moving all 4 extremities but requiring some restraints as she has been pulling at lines etc. Small subdural hematoma and patient with a history of multiple CVAs and has been on anticoagulation Lesion is nonsurgical will need to be followed conservatively At this point it does not appear that she is capable of helping her self-care and will likely need placement A follow-up CT scan should be planned in about a month and I can see the patient as an outpatient at that time.

## 2021-09-03 DIAGNOSIS — I4891 Unspecified atrial fibrillation: Secondary | ICD-10-CM

## 2021-09-03 DIAGNOSIS — Z85828 Personal history of other malignant neoplasm of skin: Secondary | ICD-10-CM | POA: Diagnosis not present

## 2021-09-03 DIAGNOSIS — I110 Hypertensive heart disease with heart failure: Secondary | ICD-10-CM | POA: Diagnosis not present

## 2021-09-03 DIAGNOSIS — I480 Paroxysmal atrial fibrillation: Secondary | ICD-10-CM | POA: Diagnosis not present

## 2021-09-03 DIAGNOSIS — Z7901 Long term (current) use of anticoagulants: Secondary | ICD-10-CM | POA: Diagnosis not present

## 2021-09-03 DIAGNOSIS — Z8673 Personal history of transient ischemic attack (TIA), and cerebral infarction without residual deficits: Secondary | ICD-10-CM | POA: Diagnosis not present

## 2021-09-03 DIAGNOSIS — I5032 Chronic diastolic (congestive) heart failure: Secondary | ICD-10-CM | POA: Diagnosis not present

## 2021-09-03 DIAGNOSIS — I6200 Nontraumatic subdural hemorrhage, unspecified: Secondary | ICD-10-CM | POA: Diagnosis not present

## 2021-09-03 DIAGNOSIS — S065X0A Traumatic subdural hemorrhage without loss of consciousness, initial encounter: Secondary | ICD-10-CM | POA: Diagnosis not present

## 2021-09-03 DIAGNOSIS — I2510 Atherosclerotic heart disease of native coronary artery without angina pectoris: Secondary | ICD-10-CM | POA: Diagnosis not present

## 2021-09-03 DIAGNOSIS — J449 Chronic obstructive pulmonary disease, unspecified: Secondary | ICD-10-CM | POA: Diagnosis not present

## 2021-09-03 LAB — GLUCOSE, CAPILLARY
Glucose-Capillary: 152 mg/dL — ABNORMAL HIGH (ref 70–99)
Glucose-Capillary: 241 mg/dL — ABNORMAL HIGH (ref 70–99)
Glucose-Capillary: 258 mg/dL — ABNORMAL HIGH (ref 70–99)
Glucose-Capillary: 248 mg/dL — ABNORMAL HIGH (ref 70–99)

## 2021-09-03 LAB — BASIC METABOLIC PANEL
Anion gap: 11 (ref 5–15)
BUN: 18 mg/dL (ref 8–23)
CO2: 25 mmol/L (ref 22–32)
Calcium: 8.6 mg/dL — ABNORMAL LOW (ref 8.9–10.3)
Chloride: 104 mmol/L (ref 98–111)
Creatinine, Ser: 0.68 mg/dL (ref 0.44–1.00)
GFR, Estimated: >60 mL/min (ref >60)
Glucose, Bld: 146 mg/dL — ABNORMAL HIGH (ref 70–99)
Potassium: 4.1 mmol/L (ref 3.5–5.1)
Sodium: 140 mmol/L (ref 135–145)

## 2021-09-03 MED ORDER — SODIUM CHLORIDE 0.9 % IV SOLN
1.0000 g | INTRAVENOUS | Status: DC
  Administered 2021-09-03 – 2021-09-04 (×2): 1 g via INTRAVENOUS
  Filled 2021-09-03 (×2): qty 10

## 2021-09-03 NOTE — TOC Progression Note (Signed)
Transition of Care [facility]) - Progression Note    Patient Details  Name: Melanie Lucas MRN: [id number] Date of Birth: 1950/07/06  [facility]) CM/SW Contact  [address] [place], [place] Phone Number: 09/03/2021, 2:21 PM  Clinical Narrative:    Patient's SNF preferences, [facility] and [place] remain pending.  Transition of Care to continue to follow  [place], LCSW Transition of Care       Barriers to Discharge: Continued Medical Work up  Expected Discharge Plan and Services   In-house Referral: Clinical Social Work     Living arrangements for the past 2 months: Single Family Home                                       Social Determinants of Health (SDOH) Interventions    Readmission Risk Interventions    08/01/2021   10:59 AM  Readmission Risk Prevention Plan  Transportation Screening Complete  PCP or Specialist Appt within 5-7 Days Complete  Home Care Screening Complete  Medication Review (RN CM) Complete

## 2021-09-03 NOTE — Progress Notes (Signed)
PROGRESS NOTE  Melanie Lucas SWF:[id number] DOB: 02/16/1951   PCP: Kristen Loader, FNP  Patient is from: Home.  Has a roommate.  Uses walker at baseline.  DOA: 08/31/2021 LOS: 0  Chief complaints Chief Complaint  Patient presents with   Fall     Brief Narrative / Interim history: 71 year old F with PMH of CAD/PCI in 2014, multiple CVAs with residual aphasia and cognitive decline, CAS s/p left CEA, diastolic CHF, PAF on Eliquis, Mnire's disease (deaf in right ear and diminished hearing in left ear), anxiety, insomnia and tobacco use disorder brought to ED after fall at home.  Daughter saw her on the floor in the living room, and thinks she has fallen while trying to change the bed sheets and likely hit her head.  Some bruising on elbows and legs.  In ED, vital stable.  CMP and CBC without significant finding.  UA with moderate LE, > 500 glucose.  CT with acute right subdural hematoma measuring 1 cm without significant mass effect.  See CT cervical spine without acute finding.  CT abdomen and pelvis with bladder wall thickening.  Troponin 36>> 34.  EKG sinus rhythm with supraventricular bigeminy and RBBB (old).  Neurosurgery and cardiology consulted.  Neurosurgery recommended remaining off anticoagulation for several months until the hematoma completely resolves.  cardiology consulted and concurs with neurosurgery recommendation and recommended continuing Tikosyn.  Therapy recommended SNF.  Subjective: Seen and examined earlier this morning.  Objective: Vitals:   09/03/21 0432 09/03/21 0500 09/03/21 0558 09/03/21 0729  BP: 125/80  (!) 143/63 (!) 143/68  Pulse: (!) 54  (!) 57 (!) 56  Resp: '13  14 14  '$ Temp: 97.8 F (36.6 C)   97.7 F (36.5 C)  TempSrc: Oral   Oral  SpO2: 93%  90% 100%  Weight:  65.5 kg    Height:        Examination:  Awake Alert, pleasant, answering questions appropriately, mild left-sided weakness at baseline from the CVA Symmetrical Chest wall movement,  Good air movement bilaterally, CTAB RRR,No Gallops,Rubs or new Murmurs, No Parasternal Heave +ve B.Sounds, Abd Soft, No tenderness, No rebound - guarding or rigidity. No Cyanosis, Clubbing or edema, No new Rash or bruise      Procedures:  None  Microbiology summarized: None  Assessment and plan: Principal Problem:   Subdural hemorrhage (HCC) Active Problems:   Fall at home, initial encounter   Paroxysmal atrial fibrillation (HCC)   Chronic anticoagulation   Elevated troponin   History of cerebrovascular accident (CVA) with residual deficit   Chronic diastolic CHF (congestive heart failure) (HCC)   Essential hypertension, benign   TOBACCO ABUSE   Type 2 diabetes mellitus without complication, without long-term current use of insulin (HCC)   COPD (chronic obstructive pulmonary disease) (HCC)   Meniere disease   Memory loss   Malnutrition of moderate degree   Acute subdural hemorrhage due to unwitnessed fall at home-patient has ambulatory dysfunction uses walker at baseline.  Reportedly had recurrent fall prior to this.  She lives with a roommate but she was by herself when she fell.  CT shows 1 cm right subdural hematoma without mass effect.  CT cervical spine without acute finding.  She has history of PAF but in sinus rhythm.  No major events on telemetry.  Low suspicion for infectious process or seizure.  She is on diazepam which could increase her risk of fall.  She had multiple CVAs in the past.  Basic labs including CMP and  CBC without significant finding. -Neurosurgery-no AC form on this until SDH resolves, repeat CT in a month and outpatient follow-up -Minimize sedating medication and benzo. -Changed Valium to low-dose Seroquel at night after discussion with patient's daughter. -Fall precaution -PT/OT eval, recommendation for SNF placement, awaiting bed availability.  Paroxysmal atrial fibrillation on chronic anticoagulation: CHA2DS2-VASc score 7-8. -Continue  Tikosyn -Optimize electrolytes -Hold anticoagulation due to above -Appreciate help by cardiology, so far maintaining normal sinus rhythm, continue with Tikosyn   Elevated troponin/history of CAD: Likely demand ischemia.  No delta.  No acute ischemic finding on EKG -Continue home meds   History of CVA with residual deficit: Had right occipital CVA in January and left basal ganglia and corona radiata CVA in June.  She has expressive aphasia, some cognitive impairment and ambulatory dysfunction.  -Continue home medications except Eliquis.  Chronic diastolic CHF: TTE in 1/47 with LVEF of 55 to 60%, G1-DD.  Appears euvolemic on exam except for BLE edema, right> left.  On p.o. Lasix at home. -Continue home Lasix  Essential hypertension: Normotensive. -Continue furosemide and losartan   Memory loss: Possible vascular dementia from multiple CVAs -Reorientation and delirium precaution -Minimize sedating medications  Anxiety/insomnia: On Cymbalta, Valium and melatonin.  Discussed my concern about her Valium with patient's daughter over the phone.   -Change Cymbalta to morning -Changed Valium to low-dose Seroquel at night after discussion with patient's daughter -Minimize or avoid benzos   NIDDM-2 with hyperglycemia: A1c 7.3% on 6/6.  On Synjardy and Prandin at home. Recent Labs  Lab 09/02/21 0852 09/02/21 1224 09/02/21 1609 09/02/21 2105 09/03/21 0742  GLUCAP 157* 176* 258* 210* 152*  -Increase SSI to moderate -Has history of statin intolerance.   Chronic COPD: Stable. -Nebs as needed   Mnire's disease: Reportedly deaf in right ear and diminished hearing in left ear.  Pyuria: Low suspicion for UTI.  But she had 2 positive urine analysis with white blood cells> 50 over last 48 hours.  So I will start empirically on Rocephin  Moderate malnutrition Body mass index is 22.62 kg/m. Nutrition Problem: Moderate Malnutrition Etiology: chronic illness (COPD, CHF, multiple  strokes) Signs/Symptoms: mild fat depletion, moderate muscle depletion Interventions: Ensure Enlive (each supplement provides 350kcal and 20 grams of protein), MVI   DVT prophylaxis:  SCDs Start: 08/31/21 1354  Code Status: Full code Family Communication: None at bedside Level of care: Telemetry Medical Status is: Observation Patient is medically clear for discharge once SNF bed is available.   Final disposition: SNF Consultants:  Neurosurgery-signed off Cardiology-signed off  Sch Meds:  Scheduled Meds:  dofetilide  250 mcg Oral BID   DULoxetine  30 mg Oral Daily   feeding supplement  237 mL Oral BID BM   furosemide  20 mg Oral Daily   insulin aspart  0-15 Units Subcutaneous TID WC   insulin aspart  0-5 Units Subcutaneous QHS   linaclotide  290 mcg Oral QAC breakfast   losartan  50 mg Oral Daily   magnesium oxide  200 mg Oral Daily   melatonin  5 mg Oral QHS   mirabegron ER  50 mg Oral QHS   multivitamin with minerals  1 tablet Oral Daily   potassium chloride SA  20 mEq Oral Daily   QUEtiapine  12.5 mg Oral QHS   sodium chloride flush  3 mL Intravenous Q12H   Continuous Infusions:  cefTRIAXone (ROCEPHIN)  IV 1 g (09/03/21 0628)   PRN Meds:.acetaminophen **OR** acetaminophen, albuterol  Antimicrobials: Anti-infectives (From admission,  onward)    Start     Dose/Rate Route Frequency Ordered Stop   09/03/21 0645  cefTRIAXone (ROCEPHIN) 1 g in sodium chloride 0.9 % 100 mL IVPB        1 g 200 mL/hr over 30 Minutes Intravenous Every 24 hours 09/03/21 0547          I have personally reviewed the following labs and images: CBC: Recent Labs  Lab 08/31/21 1007 09/01/21 0111  WBC 9.9 8.3  NEUTROABS 8.0*  --   HGB 14.5 14.3  HCT 42.4 41.2  MCV 91.8 90.4  PLT 215 207   BMP &GFR Recent Labs  Lab 08/31/21 1007 08/31/21 1202 09/01/21 0111 09/02/21 0128 09/03/21 0041  NA 142  --  141 140 140  K 3.9  --  3.5 3.9 4.1  CL 106  --  104 105 104  CO2 26  --  '27  24 25  '$ GLUCOSE 118*  --  124* 115* 146*  BUN 12  --  '13 18 18  '$ CREATININE 0.78  --  0.68 0.57 0.68  CALCIUM 8.9  --  8.7* 8.8* 8.6*  MG  --  1.8  --   --   --    Estimated Creatinine Clearance: 60.9 mL/min (by C-G formula based on SCr of 0.68 mg/dL). Liver & Pancreas: Recent Labs  Lab 08/31/21 1007  AST 20  ALT 20  ALKPHOS 80  BILITOT 0.9  PROT 5.8*  ALBUMIN 3.1*   No results for input(s): "LIPASE", "AMYLASE" in the last 168 hours. No results for input(s): "AMMONIA" in the last 168 hours. Diabetic: No results for input(s): "HGBA1C" in the last 72 hours. Recent Labs  Lab 09/02/21 0852 09/02/21 1224 09/02/21 1609 09/02/21 2105 09/03/21 0742  GLUCAP 157* 176* 258* 210* 152*   Cardiac Enzymes: Recent Labs  Lab 08/31/21 1007  CKTOTAL 127   No results for input(s): "PROBNP" in the last 8760 hours. Coagulation Profile: No results for input(s): "INR", "PROTIME" in the last 168 hours. Thyroid Function Tests: No results for input(s): "TSH", "T4TOTAL", "FREET4", "T3FREE", "THYROIDAB" in the last 72 hours. Lipid Profile: No results for input(s): "CHOL", "HDL", "LDLCALC", "TRIG", "CHOLHDL", "LDLDIRECT" in the last 72 hours. Anemia Panel: No results for input(s): "VITAMINB12", "FOLATE", "FERRITIN", "TIBC", "IRON", "RETICCTPCT" in the last 72 hours. Urine analysis:    Component Value Date/Time   COLORURINE YELLOW (A) 09/01/2021 0517   APPEARANCEUR CLOUDY (A) 09/01/2021 0517   LABSPEC 1.026 09/01/2021 0517   PHURINE 6.0 09/01/2021 0517   GLUCOSEU >=500 (A) 09/01/2021 0517   HGBUR SMALL (A) 09/01/2021 0517   BILIRUBINUR NEGATIVE 09/01/2021 0517   BILIRUBINUR negative 10/17/2015 1839   KETONESUR 20 (A) 09/01/2021 0517   PROTEINUR 30 (A) 09/01/2021 0517   UROBILINOGEN 0.2 10/17/2015 1839   UROBILINOGEN 1.0 09/20/2010 0025   NITRITE NEGATIVE 09/01/2021 0517   LEUKOCYTESUR MODERATE (A) 09/01/2021 0517   Sepsis Labs: Invalid input(s): "PROCALCITONIN",  "LACTICIDVEN"  Microbiology: No results found for this or any previous visit (from the past 240 hour(s)).  Radiology Studies: No results found.    Taye T. [place]  If 7PM-7AM, please contact night-coverage [email or web address] 09/03/2021, 11:39 AM

## 2021-09-04 DIAGNOSIS — R269 Unspecified abnormalities of gait and mobility: Secondary | ICD-10-CM | POA: Diagnosis not present

## 2021-09-04 DIAGNOSIS — Z8673 Personal history of transient ischemic attack (TIA), and cerebral infarction without residual deficits: Secondary | ICD-10-CM | POA: Diagnosis not present

## 2021-09-04 DIAGNOSIS — E119 Type 2 diabetes mellitus without complications: Secondary | ICD-10-CM | POA: Diagnosis not present

## 2021-09-04 DIAGNOSIS — Z9181 History of falling: Secondary | ICD-10-CM | POA: Diagnosis not present

## 2021-09-04 DIAGNOSIS — J449 Chronic obstructive pulmonary disease, unspecified: Secondary | ICD-10-CM | POA: Diagnosis not present

## 2021-09-04 DIAGNOSIS — S065X0A Traumatic subdural hemorrhage without loss of consciousness, initial encounter: Secondary | ICD-10-CM | POA: Diagnosis not present

## 2021-09-04 DIAGNOSIS — I2510 Atherosclerotic heart disease of native coronary artery without angina pectoris: Secondary | ICD-10-CM | POA: Diagnosis not present

## 2021-09-04 DIAGNOSIS — E611 Iron deficiency: Secondary | ICD-10-CM | POA: Diagnosis not present

## 2021-09-04 DIAGNOSIS — R8290 Unspecified abnormal findings in urine: Secondary | ICD-10-CM | POA: Diagnosis not present

## 2021-09-04 DIAGNOSIS — I4891 Unspecified atrial fibrillation: Secondary | ICD-10-CM | POA: Diagnosis not present

## 2021-09-04 DIAGNOSIS — I6200 Nontraumatic subdural hemorrhage, unspecified: Secondary | ICD-10-CM | POA: Diagnosis not present

## 2021-09-04 DIAGNOSIS — W19XXXA Unspecified fall, initial encounter: Secondary | ICD-10-CM | POA: Diagnosis not present

## 2021-09-04 DIAGNOSIS — N3090 Cystitis, unspecified without hematuria: Secondary | ICD-10-CM | POA: Diagnosis not present

## 2021-09-04 DIAGNOSIS — M25552 Pain in left hip: Secondary | ICD-10-CM | POA: Diagnosis not present

## 2021-09-04 DIAGNOSIS — S065X0D Traumatic subdural hemorrhage without loss of consciousness, subsequent encounter: Secondary | ICD-10-CM | POA: Diagnosis not present

## 2021-09-04 DIAGNOSIS — R29810 Facial weakness: Secondary | ICD-10-CM | POA: Diagnosis not present

## 2021-09-04 DIAGNOSIS — Y998 Other external cause status: Secondary | ICD-10-CM | POA: Diagnosis not present

## 2021-09-04 DIAGNOSIS — R296 Repeated falls: Secondary | ICD-10-CM | POA: Diagnosis not present

## 2021-09-04 DIAGNOSIS — I110 Hypertensive heart disease with heart failure: Secondary | ICD-10-CM | POA: Diagnosis not present

## 2021-09-04 DIAGNOSIS — I10 Essential (primary) hypertension: Secondary | ICD-10-CM | POA: Diagnosis not present

## 2021-09-04 DIAGNOSIS — I639 Cerebral infarction, unspecified: Secondary | ICD-10-CM | POA: Diagnosis not present

## 2021-09-04 DIAGNOSIS — N390 Urinary tract infection, site not specified: Secondary | ICD-10-CM | POA: Diagnosis not present

## 2021-09-04 DIAGNOSIS — I6381 Other cerebral infarction due to occlusion or stenosis of small artery: Secondary | ICD-10-CM | POA: Diagnosis not present

## 2021-09-04 DIAGNOSIS — I480 Paroxysmal atrial fibrillation: Secondary | ICD-10-CM | POA: Diagnosis not present

## 2021-09-04 DIAGNOSIS — R410 Disorientation, unspecified: Secondary | ICD-10-CM | POA: Diagnosis not present

## 2021-09-04 DIAGNOSIS — Z85828 Personal history of other malignant neoplasm of skin: Secondary | ICD-10-CM | POA: Diagnosis not present

## 2021-09-04 DIAGNOSIS — R4701 Aphasia: Secondary | ICD-10-CM | POA: Diagnosis not present

## 2021-09-04 DIAGNOSIS — I482 Chronic atrial fibrillation, unspecified: Secondary | ICD-10-CM | POA: Diagnosis not present

## 2021-09-04 DIAGNOSIS — Z7901 Long term (current) use of anticoagulants: Secondary | ICD-10-CM | POA: Diagnosis not present

## 2021-09-04 DIAGNOSIS — R29708 NIHSS score 8: Secondary | ICD-10-CM | POA: Diagnosis not present

## 2021-09-04 DIAGNOSIS — R451 Restlessness and agitation: Secondary | ICD-10-CM | POA: Diagnosis not present

## 2021-09-04 DIAGNOSIS — I69318 Other symptoms and signs involving cognitive functions following cerebral infarction: Secondary | ICD-10-CM | POA: Diagnosis not present

## 2021-09-04 DIAGNOSIS — M25551 Pain in right hip: Secondary | ICD-10-CM | POA: Diagnosis not present

## 2021-09-04 DIAGNOSIS — R404 Transient alteration of awareness: Secondary | ICD-10-CM | POA: Diagnosis not present

## 2021-09-04 DIAGNOSIS — G9341 Metabolic encephalopathy: Secondary | ICD-10-CM | POA: Diagnosis not present

## 2021-09-04 DIAGNOSIS — Z7401 Bed confinement status: Secondary | ICD-10-CM | POA: Diagnosis not present

## 2021-09-04 DIAGNOSIS — E1165 Type 2 diabetes mellitus with hyperglycemia: Secondary | ICD-10-CM | POA: Diagnosis not present

## 2021-09-04 DIAGNOSIS — E1169 Type 2 diabetes mellitus with other specified complication: Secondary | ICD-10-CM | POA: Diagnosis not present

## 2021-09-04 DIAGNOSIS — R4182 Altered mental status, unspecified: Secondary | ICD-10-CM | POA: Diagnosis not present

## 2021-09-04 DIAGNOSIS — R739 Hyperglycemia, unspecified: Secondary | ICD-10-CM | POA: Diagnosis not present

## 2021-09-04 DIAGNOSIS — W0110XA Fall on same level from slipping, tripping and stumbling with subsequent striking against unspecified object, initial encounter: Secondary | ICD-10-CM | POA: Diagnosis not present

## 2021-09-04 DIAGNOSIS — I5032 Chronic diastolic (congestive) heart failure: Secondary | ICD-10-CM | POA: Diagnosis not present

## 2021-09-04 DIAGNOSIS — I69920 Aphasia following unspecified cerebrovascular disease: Secondary | ICD-10-CM | POA: Diagnosis not present

## 2021-09-04 LAB — BASIC METABOLIC PANEL
Anion gap: 7 (ref 5–15)
BUN: 16 mg/dL (ref 8–23)
CO2: 26 mmol/L (ref 22–32)
Calcium: 8.5 mg/dL — ABNORMAL LOW (ref 8.9–10.3)
Chloride: 106 mmol/L (ref 98–111)
Creatinine, Ser: 0.65 mg/dL (ref 0.44–1.00)
GFR, Estimated: >60 mL/min (ref >60)
Glucose, Bld: 159 mg/dL — ABNORMAL HIGH (ref 70–99)
Potassium: 3.6 mmol/L (ref 3.5–5.1)
Sodium: 139 mmol/L (ref 135–145)

## 2021-09-04 LAB — GLUCOSE, CAPILLARY
Glucose-Capillary: 156 mg/dL — ABNORMAL HIGH (ref 70–99)
Glucose-Capillary: 239 mg/dL — ABNORMAL HIGH (ref 70–99)
Glucose-Capillary: 202 mg/dL — ABNORMAL HIGH (ref 70–99)
Glucose-Capillary: 172 mg/dL — ABNORMAL HIGH (ref 70–99)

## 2021-09-04 LAB — MAGNESIUM: Magnesium: 2.1 mg/dL (ref 1.7–2.4)

## 2021-09-04 MED ORDER — QUETIAPINE FUMARATE 25 MG PO TABS: 25.0000 mg | ORAL_TABLET | Freq: Every day | ORAL | Status: DC

## 2021-09-04 MED ORDER — INSULIN ASPART 100 UNIT/ML IJ SOLN
0.0000 [IU] | Freq: Every day | INTRAMUSCULAR | 11 refills | Status: DC
Start: 2021-09-04 — End: 2022-01-08

## 2021-09-04 MED ORDER — INSULIN GLARGINE-YFGN 100 UNIT/ML ~~LOC~~ SOLN
8.0000 [IU] | Freq: Every day | SUBCUTANEOUS | Status: DC
  Administered 2021-09-04: 8 [IU] via SUBCUTANEOUS
  Filled 2021-09-04 (×2): qty 0.08

## 2021-09-04 MED ORDER — MELATONIN 5 MG PO TABS: 5.0000 mg | ORAL_TABLET | Freq: Every day | ORAL | 0 refills | Status: DC

## 2021-09-04 MED ORDER — INSULIN ASPART 100 UNIT/ML IJ SOLN: 0.0000 [IU] | Freq: Three times a day (TID) | INTRAMUSCULAR | 11 refills | Status: DC

## 2021-09-04 MED ORDER — ACETAMINOPHEN 325 MG PO TABS: 650.0000 mg | ORAL_TABLET | Freq: Four times a day (QID) | ORAL | Status: AC | PRN

## 2021-09-04 MED ORDER — INSULIN GLARGINE-YFGN 100 UNIT/ML ~~LOC~~ SOLN: 8.0000 [IU] | Freq: Every day | SUBCUTANEOUS | 11 refills | Status: DC

## 2021-09-04 MED ORDER — POTASSIUM CHLORIDE CRYS ER 20 MEQ PO TBCR
40.0000 meq | EXTENDED_RELEASE_TABLET | Freq: Once | ORAL | Status: AC
  Administered 2021-09-04: 40 meq via ORAL
  Filled 2021-09-04: qty 2

## 2021-09-04 MED ORDER — QUETIAPINE FUMARATE 25 MG PO TABS
25.0000 mg | ORAL_TABLET | Freq: Every day | ORAL | Status: DC
Start: 2021-09-04 — End: 2021-09-05
  Administered 2021-09-04: 25 mg via ORAL
  Filled 2021-09-04: qty 1

## 2021-09-04 NOTE — TOC Transition Note (Signed)
Transition of Care [facility]) - CM/SW Discharge Note   Patient Details  Name: Melanie Lucas MRN: [id number] Date of Birth: 05-30-1950  [facility] CM/SW Contact:  Benard Halsted, LCSW Phone Number: 09/04/2021, 4:38 PM   Clinical Narrative:    [facility] will [facility] to: [facility] Anticipated DC date: 09/04/21 Family notified: Daughter, Regan Transport by: Corey Harold   Per MD patient ready for DC to [facility]. RN to call report prior to discharge [phone] room 808). RN, patient, patient's family, and facility notified of DC. Discharge Summary and FL2 sent to facility. DC packet on chart. Ambulance transport requested for patient.   CSW will sign off for now as social work intervention is no longer needed. Please consult [place] again if new needs arise.     Final next level of care: Skilled Nursing Facility Barriers to Discharge: Barriers Resolved   Patient Goals and CMS Choice Patient states their goals for this hospitalization and ongoing recovery are:: Return home CMS Medicare.gov Compare Post Acute Care list provided to:: Patient Represenative (must comment) Choice offered to / list presented to : Adult Children  Discharge Placement   Existing PASRR number confirmed : 09/04/21          [facility] chooses bed at: [facility] Patient to be transferred to facility by: [place] Name of family member notified: Daughter Patient and family notified of of transfer: 09/04/21  Discharge Plan and Services In-house Referral: Clinical Social Work                                   Social Determinants of Health (SDOH) Interventions     Readmission Risk Interventions    08/01/2021   10:59 AM  Readmission Risk Prevention Plan  Transportation Screening Complete  PCP or Specialist Appt within 5-7 Days Complete  Home Care Screening Complete  Medication Review (RN CM) Complete

## 2021-09-04 NOTE — Discharge Summary (Signed)
Physician Discharge Summary  Melanie Lucas UUV:[id number] DOB: 1950/02/22 DOA: 08/31/2021  PCP: Kristen Loader, FNP  Admit date: 08/31/2021 Discharge date: 09/04/2021  Admitted From: Home Disposition:  SNF  Recommendations for Outpatient Follow-up:  Check CBC, BMP in 3 days Continue with insulin during SNF stay, and transition to home oral regimen on discharge from facility Eliquis to remain on hold Patient will need repeat CT head in 1 month, and to follow with neurosurgery Dr. Ellene Route after that    Discharge Condition:Stable CODE STATUS:FULL Diet recommendation: Heart Healthy / Carb Modified   Brief/Interim Summary:  71 year old F with PMH of CAD/PCI in 2014, multiple CVAs with residual aphasia and cognitive decline, CAS s/p left CEA, diastolic CHF, PAF on Eliquis, Mnire's disease (deaf in right ear and diminished hearing in left ear), anxiety, insomnia and tobacco use disorder brought to ED after fall at home.  Daughter saw her on the floor in the living room, and thinks she has fallen while trying to change the bed sheets and likely hit her head.  Some bruising on elbows and legs.   In ED, vital stable.  CMP and CBC without significant finding.  UA with moderate LE, > 500 glucose.  CT with acute right subdural hematoma measuring 1 cm without significant mass effect.  See CT cervical spine without acute finding.  CT abdomen and pelvis with bladder wall thickening.  Troponin 36>> 34.  EKG sinus rhythm with supraventricular bigeminy and RBBB (old).  Neurosurgery and cardiology consulted.   Neurosurgery recommended remaining off anticoagulation for several months until the hematoma completely resolves.  cardiology consulted and concurs with neurosurgery recommendation and recommended continuing Tikosyn.   Therapy recommended SNF. Acute subdural hemorrhage due to unwitnessed fall at home-patient has ambulatory dysfunction uses walker at baseline.  Reportedly had recurrent fall prior to  this.  She lives with a roommate but she was by herself when she fell.  CT shows 1 cm right subdural hematoma without mass effect.  CT cervical spine without acute finding.  She has history of PAF but in sinus rhythm.  No major events on telemetry.  Low suspicion for infectious process or seizure.  She is on diazepam which could increase her risk of fall.  She had multiple CVAs in the past.  Basic labs including CMP and CBC without significant finding. -Neurosurgery-no AC form on this until SDH resolves, repeat CT in a month and outpatient follow-up -Minimize sedating medication and benzo. -Changed Valium to low-dose Seroquel at night after discussion with patient's daughter. -Fall precaution -PT/OT eval, recommendation for SNF placement, awaiting bed availability. -Patient is medically clear for discharge once bed is available   Paroxysmal atrial fibrillation on chronic anticoagulation: CHA2DS2-VASc score 7-8. -Continue Tikosyn -Optimize electrolytes -Hold anticoagulation due to above -Appreciate help by cardiology, so far maintaining normal sinus rhythm, continue with Tikosyn   Elevated troponin/history of CAD: Likely demand ischemia.  No delta.  No acute ischemic finding on EKG -Continue home meds   History of CVA with residual deficit: Had right occipital CVA in January and left basal ganglia and corona radiata CVA in June.  She has expressive aphasia, some cognitive impairment and ambulatory dysfunction.  -Continue home medications except Eliquis.   Chronic diastolic CHF: TTE in 4/74 with LVEF of 55 to 60%, G1-DD.  Appears euvolemic on exam except for BLE edema, right> left.  On p.o. Lasix at home. -Continue home Lasix   Essential hypertension: Normotensive. -Continue furosemide and losartan   Memory loss: Possible  vascular dementia from multiple CVAs -Reorientation and delirium precaution -Minimize sedating medications   Anxiety/insomnia: On Cymbalta, Valium and melatonin.   Discussed my concern about her Valium with patient's daughter over the phone.   -Change Cymbalta to morning -Changed Valium to low-dose Seroquel at night after discussion with patient's daughter   NIDDM-2 with hyperglycemia: A1c 7.3% on 6/6.  On Synjardy and Prandin at home.  This has been held during hospital stay, and it will be held on discharge to facility, they can be resumed upon patient discharge back home, meanwhile she will be discharged on insulin sliding scale and low-dose Semglee. Last Labs          Recent Labs  Lab 09/03/21 1144 09/03/21 1613 09/03/21 2108 09/04/21 0739 09/04/21 1145  GLUCAP 241* 258* 248* 156* 239*       Chronic COPD: Stable. -Nebs as needed   Mnire's disease: Reportedly deaf in right ear and diminished hearing in left ear.   Pyuria: Low suspicion for UTI.  But she had 2 positive urine analysis with white blood cells> 50 over last 48 hours.  So she was treated with Rocephin, no need for further antibiotics on discharge   Moderate malnutrition Body mass index is 22.65 kg/m. Nutrition Problem: Moderate Malnutrition Etiology: chronic illness (COPD, CHF, multiple strokes) Signs/Symptoms: mild fat depletion, moderate muscle depletion Interventions: Ensure Enlive (each supplement provides 350kcal and 20 grams of protein), MVI  Discharge Diagnoses:  Principal Problem:   Subdural hemorrhage ([place]) Active Problems:   Fall at home, initial encounter   Paroxysmal atrial fibrillation (HCC)   Chronic anticoagulation   Elevated troponin   History of cerebrovascular accident (CVA) with residual deficit   Chronic diastolic CHF (congestive heart failure) ([place])   Essential hypertension, benign   TOBACCO ABUSE   Type 2 diabetes mellitus without complication, without long-term current use of insulin (HCC)   COPD (chronic obstructive pulmonary disease) (HCC)   Meniere disease   Memory loss   Malnutrition of moderate degree    Discharge  Instructions  Discharge Instructions     Diet - low sodium heart healthy   Complete by: As directed    Discharge instructions   Complete by: As directed    Follow with Primary MD Kristen Loader, FNP /SNF physician  Get CBC, CMP,  checked  by Primary MD next visit.    Activity: As tolerated with Full fall precautions use walker/cane & assistance as needed   Disposition SNF   Diet: Heart Healthy   On your next visit with your primary care physician please Get Medicines reviewed and adjusted.   Please request your Prim.MD to go over all Hospital Tests and Procedure/Radiological results at the follow up, please get all Hospital records sent to your Prim MD by signing hospital release before you go home.   If you experience worsening of your admission symptoms, develop shortness of breath, life threatening emergency, suicidal or homicidal thoughts you must seek medical attention immediately by calling 911 or calling your MD immediately  if symptoms less severe.  You Must read complete instructions/literature along with all the possible adverse reactions/side effects for all the Medicines you take and that have been prescribed to you. Take any new Medicines after you have completely understood and accpet all the possible adverse reactions/side effects.   Do not drive, operating heavy machinery, perform activities at heights, swimming or participation in water activities or provide baby sitting services if your were admitted for syncope or siezures until you  have seen by Primary MD or a Neurologist and advised to do so again.  Do not drive when taking Pain medications.    Do not take more than prescribed Pain, Sleep and Anxiety Medications  Special Instructions: If you have smoked or chewed Tobacco  in the last 2 yrs please stop smoking, stop any regular Alcohol  and or any Recreational drug use.  Wear Seat belts while driving.   Please note  You were cared for by a  hospitalist during your hospital stay. If you have any questions about your discharge medications or the care you received while you were in the hospital after you are discharged, you can call the unit and asked to speak with the hospitalist on call if the hospitalist that took care of you is not available. Once you are discharged, your primary care physician will handle any further medical issues. Please note that NO REFILLS for any discharge medications will be authorized once you are discharged, as it is imperative that you return to your primary care physician (or establish a relationship with a primary care physician if you do not have one) for your aftercare needs so that they can reassess your need for medications and monitor your lab values.   Increase activity slowly   Complete by: As directed       Allergies as of 09/04/2021       Reactions   Alendronate Sodium Other (See Comments)   pain all over   Avelox [moxifloxacin] Other (See Comments)   Unknown reaction    Fosamax [alendronate] Other (See Comments)   Pain all over   Pradaxa [dabigatran Etexilate Mesylate] Other (See Comments)   Extreme bleeding   Statins Nausea And Vomiting, Other (See Comments)   Muscle pain, Dizziness (intolerance)   Sulfa Antibiotics Nausea And Vomiting   Sulfamethoxazole Nausea And Vomiting   Dizziness (intolerance)   Sulfonamide Derivatives Hives   Nausea vertigo   Varenicline Other (See Comments)   nightmares   Ciprofloxacin Rash   Other reaction(s): unknown   Gabapentin Rash   burning   Tape Itching, Rash   Please use "paper" tape only        Medication List     STOP taking these medications    diazepam 2 MG tablet Commonly known as: VALIUM   Eliquis 5 MG Tabs tablet Generic drug: apixaban   Empagliflozin-metFORMIN HCl 12.06-998 MG Tabs   repaglinide 2 MG tablet Commonly known as: PRANDIN       TAKE these medications    acetaminophen 325 MG tablet Commonly known as:  TYLENOL Take 2 tablets (650 mg total) by mouth every 6 (six) hours as needed for mild pain (or Fever >/= 101). What changed:  medication strength how much to take reasons to take this   albuterol 108 (90 Base) MCG/ACT inhaler Commonly known as: VENTOLIN HFA Inhale 2 puffs into the lungs every 6 (six) hours as needed for wheezing or shortness of breath.   dofetilide 250 MCG capsule Commonly known as: TIKOSYN Take 1 capsule (250 mcg total) by mouth 2 (two) times daily.   DULoxetine 30 MG capsule Commonly known as: CYMBALTA Take 30 mg by mouth at bedtime.   furosemide 20 MG tablet Commonly known as: LASIX Take 20 mg by mouth daily.   insulin aspart 100 UNIT/ML injection Commonly known as: novoLOG Inject 0-15 Units into the skin 3 (three) times daily with meals.   insulin aspart 100 UNIT/ML injection Commonly known as: novoLOG Inject 0-5  Units into the skin at bedtime.   insulin glargine-yfgn 100 UNIT/ML injection Commonly known as: SEMGLEE Inject 0.08 mLs (8 Units total) into the skin daily. Start taking on: September 05, 2021   linaclotide 290 MCG Caps capsule Commonly known as: LINZESS Take 290 mcg by mouth daily before breakfast.   losartan 50 MG tablet Commonly known as: COZAAR TAKE 1 TABLET BY MOUTH EVERY DAY   melatonin 5 MG Tabs Take 1 tablet (5 mg total) by mouth at bedtime.   mirabegron ER 50 MG Tb24 tablet Commonly known as: MYRBETRIQ Take 50 mg by mouth at bedtime.   multivitamin tablet Take 1 tablet by mouth at bedtime.   potassium chloride SA 20 MEQ tablet Commonly known as: KLOR-CON M Take 1 tablet (20 mEq total) by mouth daily.   QUEtiapine 25 MG tablet Commonly known as: SEROQUEL Take 1 tablet (25 mg total) by mouth at bedtime.        Allergies  Allergen Reactions   Alendronate Sodium Other (See Comments)    pain all over   Avelox [Moxifloxacin] Other (See Comments)    Unknown reaction    Fosamax [Alendronate] Other (See Comments)     Pain all over   Pradaxa [Dabigatran Etexilate Mesylate] Other (See Comments)    Extreme bleeding   Statins Nausea And Vomiting and Other (See Comments)    Muscle pain, Dizziness (intolerance)   Sulfa Antibiotics Nausea And Vomiting   Sulfamethoxazole Nausea And Vomiting    Dizziness (intolerance)   Sulfonamide Derivatives Hives    Nausea vertigo   Varenicline Other (See Comments)    nightmares   Ciprofloxacin Rash    Other reaction(s): unknown   Gabapentin Rash    burning   Tape Itching and Rash    Please use "paper" tape only    Consultations: Neurosurgery cardiology   Procedures/Studies: CT ABDOMEN PELVIS W CONTRAST  Result Date: 08/31/2021 CLINICAL DATA:  Left-sided flank trauma. Concern for retroperitoneal hematoma. Fall. EXAM: CT ABDOMEN AND PELVIS WITH CONTRAST TECHNIQUE: Multidetector CT imaging of the abdomen and pelvis was performed using the standard protocol following bolus administration of intravenous contrast. RADIATION DOSE REDUCTION: This exam was performed according to the departmental dose-optimization program which includes automated exposure control, adjustment of the mA and/or kV according to patient size and/or use of iterative reconstruction technique. CONTRAST:  18m OMNIPAQUE IOHEXOL 300 MG/ML  SOLN COMPARISON:  03/06/2021 chest abdomen and pelvic CTs. FINDINGS: Lower chest: No pleural fluid. No basilar pneumothorax. Mild cardiomegaly, without pericardial or pleural effusion. Tiny hiatal hernia. Hepatobiliary: Subcapsular 4 mm right hepatic lobe lesion is favored to represent cysts. Focal steatosis adjacent the falciform ligament. Normal gallbladder, without biliary ductal dilatation. Pancreas: Normal, without mass or ductal dilatation. Spleen: Normal in size, without focal abnormality. Adrenals/Urinary Tract: Right larger than left adrenal masses again identified. On the order of 5.1 cm on the right and 3.9 cm on the left. Similar to on the prior exam (when  remeasured). Indeterminate based on density measurements similar back to 08/27/2018, consistent with adenomas on that exam. Exophytic interpolar right renal 3.8 cm cyst or minimally complex cyst . In the absence of clinically indicated signs/symptoms require(s) no independent follow-up. Right renal too small to characterize lesion. Normal left kidney. No hydronephrosis. The bladder is mildly thick walled. Stomach/Bowel: The stomach is underdistended, including proximally. No gross abnormality identified. Normal colon, appendix, and terminal ileum. Normal small bowel. Vascular/Lymphatic: Aortic atherosclerosis. No abdominopelvic adenopathy. Reproductive: Normal uterus and adnexa. Other: No significant free  fluid. Mild pelvic floor laxity. No free intraperitoneal air. Musculoskeletal: Mild osteopenia. Sclerosis involving the anterior left sixth rib on 12/03 is likely related to remote trauma. There is likely also remote trauma to the lateral left ninth rib on 25/3. Degenerate disc disease at the lumbosacral junction. Trace L4-5 anterolisthesis with disc bulge at L3-4. Mild convex left lumbar spine curvature. IMPRESSION: 1. No acute or posttraumatic deformity identified. 2. Mild bladder wall thickening. Correlate with urinalysis to exclude cystitis. 3.  Tiny hiatal hernia. 4. Large, bilateral adrenal adenomas. 5.  Aortic Atherosclerosis (ICD10-I70.0). Electronically Signed   By: Abigail Miyamoto M.D.   On: 08/31/2021 12:22   CT Cervical Spine Wo Contrast  Result Date: 08/31/2021 CLINICAL DATA:  Neck trauma (Age >= 65y); fall EXAM: CT CERVICAL SPINE WITHOUT CONTRAST TECHNIQUE: Multidetector CT imaging of the cervical spine was performed without intravenous contrast. Multiplanar CT image reconstructions were also generated. RADIATION DOSE REDUCTION: This exam was performed according to the departmental dose-optimization program which includes automated exposure control, adjustment of the mA and/or kV according to patient  size and/or use of iterative reconstruction technique. COMPARISON:  January 2023 FINDINGS: Alignment: Stable. Skull base and vertebrae: Stable vertebral body heights. Stable appearance of ACDF C5-C7. No acute fracture. New lucency within the right C4 inferior facet likely reflects a synovial cyst related to facet arthropathy. Soft tissues and spinal canal: No prevertebral fluid or swelling. No visible canal hematoma. Disc levels: Degenerative changes are stable appears. Multilevel right foraminal narrowing. Upper chest: Minimally included lung apices are clear. Other: None. IMPRESSION: No acute cervical spine fracture. Electronically Signed   By: Macy Mis M.D.   On: 08/31/2021 12:21   CT Head Wo Contrast  Result Date: 08/31/2021 CLINICAL DATA:  Head trauma, moderate-severe EXAM: CT HEAD WITHOUT CONTRAST TECHNIQUE: Contiguous axial images were obtained from the base of the skull through the vertex without intravenous contrast. RADIATION DOSE REDUCTION: This exam was performed according to the departmental dose-optimization program which includes automated exposure control, adjustment of the mA and/or kV according to patient size and/or use of iterative reconstruction technique. COMPARISON:  None Available. FINDINGS: Brain: Acute subdural hemorrhage along the right cerebral convexity measuring up to 1 cm in thickness. No significant mass effect. Gray-white differentiation is preserved. There is no extra-axial fluid collection. Prominence of the ventricles and sulci reflects parenchymal volume loss. Confluent areas of low-density in the supratentorial white matter nonspecific but probably reflects similar chronic microvascular ischemic changes. There are chronic small vessel infarcts of central white matter and deep gray nuclei bilaterally. Vascular: There is atherosclerotic calcification at the skull base. Skull: Calvarium is unremarkable. Sinuses/Orbits: No acute finding. Other: Mastoid air cells are clear.  IMPRESSION: Acute subdural hemorrhage along the right cerebral convexity. No significant mass effect. Stable chronic/nonemergent findings detailed above. These results were called by telephone at the time of interpretation on 08/31/2021 at 12:11 pm to provider [facility] , who verbally acknowledged these results. Electronically Signed   By: Macy Mis M.D.   On: 08/31/2021 12:11   DG Chest Portable 1 View  Result Date: 08/31/2021 CLINICAL DATA:  Chest pain EXAM: PORTABLE CHEST 1 VIEW COMPARISON:  07/25/2021 FINDINGS: Elevation of the right hemidiaphragm. Chronic interstitial changes. No pleural effusion or pneumothorax. Similar mild cardiomegaly. Lower cervical ACDF. IMPRESSION: New elevation the right hemidiaphragm. Otherwise no acute process in the chest. Electronically Signed   By: Macy Mis M.D.   On: 08/31/2021 11:32      Subjective: No significant events overnight,  patient denies any complaints  Discharge Exam: Vitals:   09/04/21 0737 09/04/21 1145  BP: 140/77 (!) 152/62  Pulse: (!) 55 (!) 57  Resp: 17 17  Temp: 97.8 F (36.6 C) 98.9 F (37.2 C)  SpO2: 95% 95%   Vitals:   09/04/21 0322 09/04/21 0500 09/04/21 0737 09/04/21 1145  BP: (!) 153/74  140/77 (!) 152/62  Pulse: 60  (!) 55 (!) 57  Resp: '18  17 17  '$ Temp: 98.7 F (37.1 C)  97.8 F (36.6 C) 98.9 F (37.2 C)  TempSrc: Oral  Oral Oral  SpO2: 95%  95% 95%  Weight:  65.6 kg    Height:         Awake Alert, pleasant,  mild left-sided weakness at baseline from the CVA Symmetrical Chest wall movement, Good air movement bilaterally, CTAB RRR,No Gallops,Rubs or new Murmurs, No Parasternal Heave +ve B.Sounds, Abd Soft, No tenderness, No rebound - guarding or rigidity. No Cyanosis, Clubbing or edema, No new Rash or bruise        The results of significant diagnostics from this hospitalization (including imaging, microbiology, ancillary and laboratory) are listed below for reference.     Microbiology: No  results found for this or any previous visit (from the past 240 hour(s)).   Labs: BNP (last 3 results) Recent Labs    01/27/21 1408 03/07/21 1146  BNP 189.4* 193.7*   Basic Metabolic Panel: Recent Labs  Lab 08/31/21 1007 08/31/21 1202 09/01/21 0111 09/02/21 0128 09/03/21 0041 09/04/21 0322  NA 142  --  141 140 140 139  K 3.9  --  3.5 3.9 4.1 3.6  CL 106  --  104 105 104 106  CO2 26  --  '27 24 25 26  '$ GLUCOSE 118*  --  124* 115* 146* 159*  BUN 12  --  '13 18 18 16  '$ CREATININE 0.78  --  0.68 0.57 0.68 0.65  CALCIUM 8.9  --  8.7* 8.8* 8.6* 8.5*  MG  --  1.8  --   --   --  2.1   Liver Function Tests: Recent Labs  Lab 08/31/21 1007  AST 20  ALT 20  ALKPHOS 80  BILITOT 0.9  PROT 5.8*  ALBUMIN 3.1*   No results for input(s): "LIPASE", "AMYLASE" in the last 168 hours. No results for input(s): "AMMONIA" in the last 168 hours. CBC: Recent Labs  Lab 08/31/21 1007 09/01/21 0111  WBC 9.9 8.3  NEUTROABS 8.0*  --   HGB 14.5 14.3  HCT 42.4 41.2  MCV 91.8 90.4  PLT 215 207   Cardiac Enzymes: Recent Labs  Lab 08/31/21 1007  CKTOTAL 127   BNP: Invalid input(s): "POCBNP" CBG: Recent Labs  Lab 09/03/21 1144 09/03/21 1613 09/03/21 2108 09/04/21 0739 09/04/21 1145  GLUCAP 241* 258* 248* 156* 239*   D-Dimer No results for input(s): "DDIMER" in the last 72 hours. Hgb A1c No results for input(s): "HGBA1C" in the last 72 hours. Lipid Profile No results for input(s): "CHOL", "HDL", "LDLCALC", "TRIG", "CHOLHDL", "LDLDIRECT" in the last 72 hours. Thyroid function studies No results for input(s): "TSH", "T4TOTAL", "T3FREE", "THYROIDAB" in the last 72 hours.  Invalid input(s): "FREET3" Anemia work up No results for input(s): "VITAMINB12", "FOLATE", "FERRITIN", "TIBC", "IRON", "RETICCTPCT" in the last 72 hours. Urinalysis    Component Value Date/Time   COLORURINE YELLOW (A) 09/01/2021 0517   APPEARANCEUR CLOUDY (A) 09/01/2021 0517   LABSPEC 1.026 09/01/2021 0517    PHURINE 6.0 09/01/2021 0517  GLUCOSEU >=500 (A) 09/01/2021 0517   HGBUR SMALL (A) 09/01/2021 0517   BILIRUBINUR NEGATIVE 09/01/2021 0517   BILIRUBINUR negative 10/17/2015 1839   KETONESUR 20 (A) 09/01/2021 0517   PROTEINUR 30 (A) 09/01/2021 0517   UROBILINOGEN 0.2 10/17/2015 1839   UROBILINOGEN 1.0 09/20/2010 0025   NITRITE NEGATIVE 09/01/2021 0517   LEUKOCYTESUR MODERATE (A) 09/01/2021 0517   Sepsis Labs Recent Labs  Lab 08/31/21 1007 09/01/21 0111  WBC 9.9 8.3   Microbiology No results found for this or any previous visit (from the past 240 hour(s)).   Time coordinating discharge: Over 30 minutes  SIGNED:   Phillips Climes, MD  [facility] 09/04/2021, 4:15 PM Pager   If 7PM-7AM, please contact night-coverage [email or web address] Password [email or web address]

## 2021-09-04 NOTE — TOC Progression Note (Addendum)
Transition of Care [facility]) - Progression Note    Patient Details  Name: Melanie Lucas MRN: [id number] Date of Birth: 1951/02/02  [facility] of [facility] CM/SW [place], LCSW Phone Number: 09/04/2021, 9:06 AM  Clinical Narrative:    9am-[facility] able to accept patient today pending insurance authorization. CSW requested updated PT note for insurance. Authorization process started, Ref# [id number].   1pm-Daughter requesting [facility]. [facility] able to accept patient. CSW updated insurance. [facility] requesting updated PT note.    3pm-CSW sent updated PT note to [facility].  4:30p-Insurance approval received for Angelica Pou ID# [id number], effective 09/04/2021-09/06/2021. Updated daughter and she requested [facility] for transport.     Barriers to Discharge: Continued Medical Work up  Expected Discharge Plan and Services   In-house Referral: Clinical Social Work     Living arrangements for the past 2 months: Single Family Home                                       Social Determinants of Health (SDOH) Interventions    Readmission Risk Interventions    08/01/2021   10:59 AM  Readmission Risk Prevention Plan  Transportation Screening Complete  PCP or Specialist Appt within 5-7 Days Complete  Home Care Screening Complete  Medication Review (RN CM) Complete

## 2021-09-04 NOTE — Progress Notes (Signed)
RN gave report to Michelle Nasuti, RN [phone]  of [facility] at this time.

## 2021-09-04 NOTE — Progress Notes (Addendum)
Patient ID: Melanie Lucas, female   DOB: 04-01-1950, 71 y.o.   MRN: [id number] Vital signs are stable Patient is being transferred to SNF today We will follow-up on an outpatient basis.  CT scan of head in the month

## 2021-09-04 NOTE — Progress Notes (Signed)
Physical Therapy Treatment Patient Details Name: Melanie Lucas MRN: [id number] DOB: [birth year] Today's Date: 09/04/2021   History of Present Illness 71 y.o. female presents to [facility] on 08/31/2021 after having a fall at home. Head CT reveals an acute subdural hemorrhage along the right cerebral convexity. Abdominal and pelvic CT reveals minor hiatal hernia and large B adrenal adenomas. 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    PT Comments    Patient responds slowly to commands. Needs step by step cues for sequencing for rolling, side to sit, sit to stand, side-stepping toward [facility], and sit to sidelying. Very slow processing throughout. Pt given +1 HHA for standing but reaching for UE support via free hand. Switched to standing in front of pt with pt holding onto PT's forearms as there was no RW in room. Anticipate can progress to ambulation with RW and +2 assist for safety.     Recommendations for follow up therapy are one component of a multi-disciplinary discharge planning process, led by the attending physician.  Recommendations may be updated based on patient status, additional functional criteria and insurance authorization.  Follow Up Recommendations  Skilled nursing-short term rehab (<3 hours/day) Can patient physically be transported by private vehicle: No   Assistance Recommended at Discharge Frequent or constant Supervision/Assistance  Patient can return home with the following A lot of help with walking and/or transfers;A lot of help with bathing/dressing/bathroom;Assistance with cooking/housework;Direct supervision/assist for medications management;Direct supervision/assist for financial management;Assist for transportation;Help with stairs or ramp for entrance   Equipment Recommendations  Rolling walker (2 wheels)    Recommendations for Other Services        Precautions / Restrictions Precautions Precautions: Fall Restrictions Weight Bearing Restrictions: No     Mobility  Bed Mobility Overal bed mobility: Needs Assistance Bed Mobility: Rolling, Sidelying to Sit, Sit to Sidelying Rolling: Min assist Sidelying to sit: Mod assist     Sit to sidelying: Min assist General bed mobility comments: required cues for sequencing; assist to roll hips over, raise torso and then raise legs onto bed    Transfers Overall transfer level: Needs assistance Equipment used: 1 person hand held assist Transfers: Sit to/from Stand Sit to Stand: Min assist           General transfer comment: stood from EOB x 5 reps with pt reaching for UE support (no RW in room); required boosting assist and then balancing assist    Ambulation/Gait             Pre-gait activities: side step toward [facility] with assist to wt-shift and advance each leg in correct direction; eventually side stepped x 2 ft General Gait Details: deferred due to no RW and need +2 for equipment for safety   Stairs             Wheelchair Mobility    Modified Rankin (Stroke Patients Only)       Balance Overall balance assessment: History of Falls, Needs assistance Sitting-balance support: No upper extremity supported, Feet supported Sitting balance-Leahy Scale: Fair     Standing balance support: Bilateral upper extremity supported, Reliant on assistive device for balance Standing balance-Leahy Scale: Poor                              Cognition Arousal/Alertness: Awake/alert Behavior During Therapy: WFL for tasks  assessed/performed Overall Cognitive Status: Impaired/Different from baseline Area of Impairment: Orientation, Following commands, Safety/judgement, Awareness, Problem solving, Memory                 Orientation Level: Disoriented to, Time, Situation   Memory: Decreased recall of precautions, Decreased short-term memory Following  Commands: Follows one step commands with increased time Safety/Judgement: Decreased awareness of safety, Decreased awareness of deficits Awareness: Intellectual Problem Solving: Slow processing, Decreased initiation, Difficulty sequencing, Requires verbal cues General Comments: thinks she is able to go home; poor awareness of her deficits        Exercises      General Comments        Pertinent Vitals/Pain Pain Assessment Pain Assessment: Faces Faces Pain Scale: Hurts whole lot Pain Location: left side Pain Descriptors / Indicators: Discomfort, Grimacing, Guarding Pain Intervention(s): Limited activity within patient's tolerance, Monitored during session, Repositioned    Home Living                          Prior Function            PT Goals (current goals can now be found in the care plan section) Acute Rehab PT Goals Patient Stated Goal: Return home Time For Goal Achievement: 09/15/21 Potential to Achieve Goals: Fair Progress towards PT goals: Progressing toward goals    Frequency    Min 2X/week      PT Plan Current plan remains appropriate    Co-evaluation              AM-PAC PT "6 Clicks" Mobility   Outcome Measure  Help needed turning from your back to your side while in a flat bed without using bedrails?: A Little Help needed moving from lying on your back to sitting on the side of a flat bed without using bedrails?: A Little Help needed moving to and from a bed to a chair (including a wheelchair)?: A Lot Help needed standing up from a chair using your arms (e.g., wheelchair or bedside chair)?: A Little Help needed to walk in hospital room?: Total Help needed climbing 3-5 steps with a railing? : Total 6 Click Score: 13    End of Session Equipment Utilized During Treatment: Gait belt Activity Tolerance: Patient tolerated treatment well Patient left: in bed;with call bell/phone within reach;with bed alarm set Nurse Communication:  Mobility status PT Visit Diagnosis: Unsteadiness on feet (R26.81);Other abnormalities of gait and mobility (R26.89);Muscle weakness (generalized) (M62.81);History of falling (Z91.81);Difficulty in walking, not elsewhere classified (R26.2)     Time: 5631-4970 PT Time Calculation (min) (ACUTE ONLY): 18 min  Charges:                         Arby Barrette, PT Acute Rehabilitation Services  Office [phone]    Rexanne Mano 09/04/2021, 2:02 PM

## 2021-09-04 NOTE — Progress Notes (Signed)
Pharmacy Review for Dofetilide (Tikosyn)  Admit Complaint: 71 y.o. female admitted 08/31/2021 with atrial fibrillation to be initiated on dofetilide.   Assessment:  Patient Exclusion Criteria: If any screening criteria checked as "Yes", then  patient  should NOT receive dofetilide until criteria item is corrected. If "Yes" please indicate correction plan.  YES  NO Patient  Exclusion Criteria Correction Plan  '[]'$  '[]'$  Baseline QTc interval is greater than or equal to 440 msec. IF above YES box checked dofetilide contraindicated unless patient has ICD; then may proceed if QTc 500-550 msec or with known ventricular conduction abnormalities may proceed with QTc 550-600 msec. QTc = 0.37   '[]'$  '[]'$  Magnesium level is less than 1.8 mEq/l : Last magnesium:  Lab Results  Component Value Date   MG 2.1 09/04/2021         '[x]'$  '[]'$  Potassium level is less than 4 mEq/l : Last potassium:  Lab Results  Component Value Date   K 3.6 09/04/2021        See below  '[]'$  '[]'$  Patient is known or suspected to have a digoxin level greater than 2 ng/ml: No results found for: "DIGOXIN"    '[]'$  '[]'$  Creatinine clearance less than 20 ml/min (calculated using Cockcroft-Gault, actual body weight and serum creatinine): Estimated Creatinine Clearance: 60.9 mL/min (by C-G formula based on SCr of 0.65 mg/dL).    '[]'$  '[]'$  Patient has received drugs known to prolong the QT intervals within the last 48 hours (phenothiazines, tricyclics or tetracyclic antidepressants, erythromycin, H-1 antihistamines, cisapride, fluoroquinolones, azithromycin). Drugs not listed above may have an, as yet, undetected potential to prolong the QT interval, updated information on QT prolonging agents is available at this website:QT prolonging agents   '[]'$  '[]'$  Patient received a dose of hydrochlorothiazide (Oretic) alone or in any combination including triamterene (Dyazide, Maxzide) in the last 48 hours.   '[]'$  '[]'$  Patient received a medication known to increase  dofetilide plasma concentrations prior to initial dofetilide dose:  Trimethoprim (Primsol, Proloprim) in the last 36 hours Verapamil (Calan, Verelan) in the last 36 hours or a sustained release dose in the last 72 hours Megestrol (Megace) in the last 5 days  Cimetidine (Tagamet) in the last 6 hours Ketoconazole (Nizoral) in the last 24 hours Itraconazole (Sporanox) in the last 48 hours  Prochlorperazine (Compazine) in the last 36 hours    '[]'$  '[]'$  Patient is known to have a history of torsades de pointes; congenital or acquired long QT syndromes.   '[]'$  '[]'$  Patient has received a Class 1 antiarrhythmic with less than 2 half-lives since last dose. (Disopyramide, Quinidine, Procainamide, Lidocaine, Mexiletine, Flecainide, Propafenone)   '[]'$  '[]'$  Patient has received amiodarone therapy in the past 3 months or amiodarone level is greater than 0.3 ng/ml.     Goal of Therapy: Follow renal function, electrolytes, potential drug interactions, and dose adjustment. Provide education and 1 week supply at discharge.   Plan:  - potassium chloride 20 mEq PO daily - magnesium oxide '200mg'$  PO daily - additional x1 potassium chloride 40 mEq PO    Thank you for allowing pharmacy to be a part of this patient's care.  Ardyth Harps, PharmD Clinical Pharmacist

## 2021-09-04 NOTE — Plan of Care (Signed)

## 2021-09-04 NOTE — Discharge Instructions (Signed)
Follow with Primary MD Kristen Loader, FNP /SNF physician  Get CBC, CMP,  checked  by Primary MD next visit.    Activity: As tolerated with Full fall precautions use walker/cane & assistance as needed   Disposition SNF   Diet: Heart Healthy   On your next visit with your primary care physician please Get Medicines reviewed and adjusted.   Please request your Prim.MD to go over all Hospital Tests and Procedure/Radiological results at the follow up, please get all Hospital records sent to your Prim MD by signing hospital release before you go home.   If you experience worsening of your admission symptoms, develop shortness of breath, life threatening emergency, suicidal or homicidal thoughts you must seek medical attention immediately by calling 911 or calling your MD immediately  if symptoms less severe.  You Must read complete instructions/literature along with all the possible adverse reactions/side effects for all the Medicines you take and that have been prescribed to you. Take any new Medicines after you have completely understood and accpet all the possible adverse reactions/side effects.   Do not drive, operating heavy machinery, perform activities at heights, swimming or participation in water activities or provide baby sitting services if your were admitted for syncope or siezures until you have seen by Primary MD or a Neurologist and advised to do so again.  Do not drive when taking Pain medications.    Do not take more than prescribed Pain, Sleep and Anxiety Medications  Special Instructions: If you have smoked or chewed Tobacco  in the last 2 yrs please stop smoking, stop any regular Alcohol  and or any Recreational drug use.  Wear Seat belts while driving.   Please note  You were cared for by a hospitalist during your hospital stay. If you have any questions about your discharge medications or the care you received while you were in the hospital after you are  discharged, you can call the unit and asked to speak with the hospitalist on call if the hospitalist that took care of you is not available. Once you are discharged, your primary care physician will handle any further medical issues. Please note that NO REFILLS for any discharge medications will be authorized once you are discharged, as it is imperative that you return to your primary care physician (or establish a relationship with a primary care physician if you do not have one) for your aftercare needs so that they can reassess your need for medications and monitor your lab values.

## 2021-09-04 NOTE — Progress Notes (Addendum)
PROGRESS NOTE  Melanie Lucas LOV:[id number] DOB: October 17, 1950   PCP: Kristen Loader, FNP  Patient is from: Home.  Has a roommate.  Uses walker at baseline.  DOA: 08/31/2021 LOS: 0  Chief complaints Chief Complaint  Patient presents with   Fall     Brief Narrative / Interim history: 71 year old F with PMH of CAD/PCI in 2014, multiple CVAs with residual aphasia and cognitive decline, CAS s/p left CEA, diastolic CHF, PAF on Eliquis, Mnire's disease (deaf in right ear and diminished hearing in left ear), anxiety, insomnia and tobacco use disorder brought to ED after fall at home.  Daughter saw her on the floor in the living room, and thinks she has fallen while trying to change the bed sheets and likely hit her head.  Some bruising on elbows and legs.  In ED, vital stable.  CMP and CBC without significant finding.  UA with moderate LE, > 500 glucose.  CT with acute right subdural hematoma measuring 1 cm without significant mass effect.  See CT cervical spine without acute finding.  CT abdomen and pelvis with bladder wall thickening.  Troponin 36>> 34.  EKG sinus rhythm with supraventricular bigeminy and RBBB (old).  Neurosurgery and cardiology consulted.  Neurosurgery recommended remaining off anticoagulation for several months until the hematoma completely resolves.  cardiology consulted and concurs with neurosurgery recommendation and recommended continuing Tikosyn.  Therapy recommended SNF.  Subjective: Seen and examined earlier this morning.  No significant events overnight as discussed with staff, she denies any complaints today.  Objective: Vitals:   09/04/21 0322 09/04/21 0500 09/04/21 0737 09/04/21 1145  BP: (!) 153/74  140/77 (!) 152/62  Pulse: 60  (!) 55 (!) 57  Resp: '18  17 17  '$ Temp: 98.7 F (37.1 C)  97.8 F (36.6 C) 98.9 F (37.2 C)  TempSrc: Oral  Oral Oral  SpO2: 95%  95% 95%  Weight:  65.6 kg    Height:        Examination:  Awake Alert, pleasant,  mild  left-sided weakness at baseline from the CVA Symmetrical Chest wall movement, Good air movement bilaterally, CTAB RRR,No Gallops,Rubs or new Murmurs, No Parasternal Heave +ve B.Sounds, Abd Soft, No tenderness, No rebound - guarding or rigidity. No Cyanosis, Clubbing or edema, No new Rash or bruise       Procedures:  None  Microbiology summarized: None  Assessment and plan: Principal Problem:   Subdural hemorrhage (HCC) Active Problems:   Fall at home, initial encounter   Paroxysmal atrial fibrillation (HCC)   Chronic anticoagulation   Elevated troponin   History of cerebrovascular accident (CVA) with residual deficit   Chronic diastolic CHF (congestive heart failure) (HCC)   Essential hypertension, benign   TOBACCO ABUSE   Type 2 diabetes mellitus without complication, without long-term current use of insulin (HCC)   COPD (chronic obstructive pulmonary disease) (HCC)   Meniere disease   Memory loss   Malnutrition of moderate degree   Acute subdural hemorrhage due to unwitnessed fall at home-patient has ambulatory dysfunction uses walker at baseline.  Reportedly had recurrent fall prior to this.  She lives with a roommate but she was by herself when she fell.  CT shows 1 cm right subdural hematoma without mass effect.  CT cervical spine without acute finding.  She has history of PAF but in sinus rhythm.  No major events on telemetry.  Low suspicion for infectious process or seizure.  She is on diazepam which could increase her risk of  fall.  She had multiple CVAs in the past.  Basic labs including CMP and CBC without significant finding. -Neurosurgery-no AC form on this until SDH resolves, repeat CT in a month and outpatient follow-up -Minimize sedating medication and benzo. -Changed Valium to low-dose Seroquel at night after discussion with patient's daughter. -Fall precaution -PT/OT eval, recommendation for SNF placement, awaiting bed availability. -Patient is medically clear  for discharge once bed is available  Paroxysmal atrial fibrillation on chronic anticoagulation: CHA2DS2-VASc score 7-8. -Continue Tikosyn -Optimize electrolytes -Hold anticoagulation due to above -Appreciate help by cardiology, so far maintaining normal sinus rhythm, continue with Tikosyn   Elevated troponin/history of CAD: Likely demand ischemia.  No delta.  No acute ischemic finding on EKG -Continue home meds   History of CVA with residual deficit: Had right occipital CVA in January and left basal ganglia and corona radiata CVA in June.  She has expressive aphasia, some cognitive impairment and ambulatory dysfunction.  -Continue home medications except Eliquis.  Chronic diastolic CHF: TTE in 6/38 with LVEF of 55 to 60%, G1-DD.  Appears euvolemic on exam except for BLE edema, right> left.  On p.o. Lasix at home. -Continue home Lasix  Essential hypertension: Normotensive. -Continue furosemide and losartan   Memory loss: Possible vascular dementia from multiple CVAs -Reorientation and delirium precaution -Minimize sedating medications  Anxiety/insomnia: On Cymbalta, Valium and melatonin.  Discussed my concern about her Valium with patient's daughter over the phone.   -Change Cymbalta to morning -Changed Valium to low-dose Seroquel at night after discussion with patient's daughter -Minimize or avoid benzos   NIDDM-2 with hyperglycemia: A1c 7.3% on 6/6.  On Synjardy and Prandin at home. Recent Labs  Lab 09/03/21 1144 09/03/21 1613 09/03/21 2108 09/04/21 0739 09/04/21 1145  GLUCAP 241* 258* 248* 156* 239*  -Increase SSI to moderate -Has history of statin intolerance. -Her CBG started to increase, so started on low-dose Semglee, continue with insulin sliding scale and hold oral agents.   Chronic COPD: Stable. -Nebs as needed   Mnire's disease: Reportedly deaf in right ear and diminished hearing in left ear.  Pyuria: Low suspicion for UTI.  But she had 2 positive urine  analysis with white blood cells> 50 over last 48 hours.  So I will start empirically on Rocephin  Moderate malnutrition Body mass index is 22.65 kg/m. Nutrition Problem: Moderate Malnutrition Etiology: chronic illness (COPD, CHF, multiple strokes) Signs/Symptoms: mild fat depletion, moderate muscle depletion Interventions: Ensure Enlive (each supplement provides 350kcal and 20 grams of protein), MVI   DVT prophylaxis:  SCDs Start: 08/31/21 1354  Code Status: Full code Family Communication: None at bedside Level of care: Telemetry Medical Status is: Observation Patient is medically clear for discharge once SNF bed is available.   Final disposition: SNF Consultants:  Neurosurgery-signed off Cardiology-signed off  Sch Meds:  Scheduled Meds:  dofetilide  250 mcg Oral BID   DULoxetine  30 mg Oral Daily   feeding supplement  237 mL Oral BID BM   furosemide  20 mg Oral Daily   insulin aspart  0-15 Units Subcutaneous TID WC   insulin aspart  0-5 Units Subcutaneous QHS   insulin glargine-yfgn  8 Units Subcutaneous Daily   linaclotide  290 mcg Oral QAC breakfast   losartan  50 mg Oral Daily   magnesium oxide  200 mg Oral Daily   melatonin  5 mg Oral QHS   mirabegron ER  50 mg Oral QHS   multivitamin with minerals  1 tablet Oral Daily  potassium chloride SA  20 mEq Oral Daily   QUEtiapine  25 mg Oral QHS   sodium chloride flush  3 mL Intravenous Q12H   Continuous Infusions:  cefTRIAXone (ROCEPHIN)  IV 1 g (09/04/21 0629)   PRN Meds:.acetaminophen **OR** acetaminophen, albuterol  Antimicrobials: Anti-infectives (From admission, onward)    Start     Dose/Rate Route Frequency Ordered Stop   09/03/21 0645  cefTRIAXone (ROCEPHIN) 1 g in sodium chloride 0.9 % 100 mL IVPB        1 g 200 mL/hr over 30 Minutes Intravenous Every 24 hours 09/03/21 0547          I have personally reviewed the following labs and images: CBC: Recent Labs  Lab 08/31/21 1007 09/01/21 0111   WBC 9.9 8.3  NEUTROABS 8.0*  --   HGB 14.5 14.3  HCT 42.4 41.2  MCV 91.8 90.4  PLT 215 207   BMP &GFR Recent Labs  Lab 08/31/21 1007 08/31/21 1202 09/01/21 0111 09/02/21 0128 09/03/21 0041 09/04/21 0322  NA 142  --  141 140 140 139  K 3.9  --  3.5 3.9 4.1 3.6  CL 106  --  104 105 104 106  CO2 26  --  '27 24 25 26  '$ GLUCOSE 118*  --  124* 115* 146* 159*  BUN 12  --  '13 18 18 16  '$ CREATININE 0.78  --  0.68 0.57 0.68 0.65  CALCIUM 8.9  --  8.7* 8.8* 8.6* 8.5*  MG  --  1.8  --   --   --  2.1   Estimated Creatinine Clearance: 60.9 mL/min (by C-G formula based on SCr of 0.65 mg/dL). Liver & Pancreas: Recent Labs  Lab 08/31/21 1007  AST 20  ALT 20  ALKPHOS 80  BILITOT 0.9  PROT 5.8*  ALBUMIN 3.1*   No results for input(s): "LIPASE", "AMYLASE" in the last 168 hours. No results for input(s): "AMMONIA" in the last 168 hours. Diabetic: No results for input(s): "HGBA1C" in the last 72 hours. Recent Labs  Lab 09/03/21 1144 09/03/21 1613 09/03/21 2108 09/04/21 0739 09/04/21 1145  GLUCAP 241* 258* 248* 156* 239*   Cardiac Enzymes: Recent Labs  Lab 08/31/21 1007  CKTOTAL 127   No results for input(s): "PROBNP" in the last 8760 hours. Coagulation Profile: No results for input(s): "INR", "PROTIME" in the last 168 hours. Thyroid Function Tests: No results for input(s): "TSH", "T4TOTAL", "FREET4", "T3FREE", "THYROIDAB" in the last 72 hours. Lipid Profile: No results for input(s): "CHOL", "HDL", "LDLCALC", "TRIG", "CHOLHDL", "LDLDIRECT" in the last 72 hours. Anemia Panel: No results for input(s): "VITAMINB12", "FOLATE", "FERRITIN", "TIBC", "IRON", "RETICCTPCT" in the last 72 hours. Urine analysis:    Component Value Date/Time   COLORURINE YELLOW (A) 09/01/2021 0517   APPEARANCEUR CLOUDY (A) 09/01/2021 0517   LABSPEC 1.026 09/01/2021 0517   PHURINE 6.0 09/01/2021 0517   GLUCOSEU >=500 (A) 09/01/2021 0517   HGBUR SMALL (A) 09/01/2021 0517   BILIRUBINUR NEGATIVE  09/01/2021 0517   BILIRUBINUR negative 10/17/2015 1839   KETONESUR 20 (A) 09/01/2021 0517   PROTEINUR 30 (A) 09/01/2021 0517   UROBILINOGEN 0.2 10/17/2015 1839   UROBILINOGEN 1.0 09/20/2010 0025   NITRITE NEGATIVE 09/01/2021 0517   LEUKOCYTESUR MODERATE (A) 09/01/2021 0517   Sepsis Labs: Invalid input(s): "PROCALCITONIN", "LACTICIDVEN"  Microbiology: No results found for this or any previous visit (from the past 240 hour(s)).  Radiology Studies: No results found.   Phillips Climes MD [facility]  If 7PM-7AM, please  contact night-coverage [email or web address] 09/04/2021, 1:00 PM

## 2021-09-07 ENCOUNTER — Other Ambulatory Visit (HOSPITAL_COMMUNITY): Payer: Self-pay | Admitting: Internal Medicine

## 2021-09-07 NOTE — Telephone Encounter (Signed)
Eliquis '5mg'$  refill request received. Patient is 71 years old, weight-65.6kg, Crea- 0.65 on 09/04/2021, Diagnosis-Afib, and last seen by Roderic Palau on 08/09/2021. Dose is appropriate based on dosing criteria. However, the eliquis was placed on hold per recent d/c summary. Will not send as this med is on hold and pt is in SNF.    08/31/21-09/04/2021-Hospital: Acute subdural hemorrhage due to unwitnessed fall at home- and per d/c summary Eliquis to remain on hold, d/c to SNF

## 2021-09-11 DIAGNOSIS — E785 Hyperlipidemia, unspecified: Secondary | ICD-10-CM | POA: Diagnosis not present

## 2021-09-11 DIAGNOSIS — G9341 Metabolic encephalopathy: Secondary | ICD-10-CM | POA: Diagnosis not present

## 2021-09-11 DIAGNOSIS — I10 Essential (primary) hypertension: Secondary | ICD-10-CM | POA: Diagnosis not present

## 2021-09-11 DIAGNOSIS — R269 Unspecified abnormalities of gait and mobility: Secondary | ICD-10-CM | POA: Diagnosis not present

## 2021-09-11 DIAGNOSIS — I491 Atrial premature depolarization: Secondary | ICD-10-CM | POA: Diagnosis not present

## 2021-09-11 DIAGNOSIS — N3090 Cystitis, unspecified without hematuria: Secondary | ICD-10-CM | POA: Diagnosis not present

## 2021-09-11 DIAGNOSIS — W0110XA Fall on same level from slipping, tripping and stumbling with subsequent striking against unspecified object, initial encounter: Secondary | ICD-10-CM | POA: Diagnosis not present

## 2021-09-11 DIAGNOSIS — I6921 Attention and concentration deficit following other nontraumatic intracranial hemorrhage: Secondary | ICD-10-CM | POA: Diagnosis not present

## 2021-09-11 DIAGNOSIS — E119 Type 2 diabetes mellitus without complications: Secondary | ICD-10-CM | POA: Diagnosis not present

## 2021-09-11 DIAGNOSIS — I6381 Other cerebral infarction due to occlusion or stenosis of small artery: Secondary | ICD-10-CM | POA: Diagnosis not present

## 2021-09-11 DIAGNOSIS — I4510 Unspecified right bundle-branch block: Secondary | ICD-10-CM | POA: Diagnosis not present

## 2021-09-11 DIAGNOSIS — Z743 Need for continuous supervision: Secondary | ICD-10-CM | POA: Diagnosis not present

## 2021-09-11 DIAGNOSIS — S065XAA Traumatic subdural hemorrhage with loss of consciousness status unknown, initial encounter: Secondary | ICD-10-CM | POA: Diagnosis not present

## 2021-09-11 DIAGNOSIS — R739 Hyperglycemia, unspecified: Secondary | ICD-10-CM | POA: Diagnosis not present

## 2021-09-11 DIAGNOSIS — I2510 Atherosclerotic heart disease of native coronary artery without angina pectoris: Secondary | ICD-10-CM | POA: Diagnosis not present

## 2021-09-11 DIAGNOSIS — Y998 Other external cause status: Secondary | ICD-10-CM | POA: Diagnosis not present

## 2021-09-11 DIAGNOSIS — I69318 Other symptoms and signs involving cognitive functions following cerebral infarction: Secondary | ICD-10-CM | POA: Diagnosis not present

## 2021-09-11 DIAGNOSIS — I480 Paroxysmal atrial fibrillation: Secondary | ICD-10-CM | POA: Diagnosis not present

## 2021-09-11 DIAGNOSIS — E1169 Type 2 diabetes mellitus with other specified complication: Secondary | ICD-10-CM | POA: Diagnosis not present

## 2021-09-11 DIAGNOSIS — I69292 Facial weakness following other nontraumatic intracranial hemorrhage: Secondary | ICD-10-CM | POA: Diagnosis not present

## 2021-09-11 DIAGNOSIS — Z515 Encounter for palliative care: Secondary | ICD-10-CM | POA: Diagnosis not present

## 2021-09-11 DIAGNOSIS — I639 Cerebral infarction, unspecified: Secondary | ICD-10-CM | POA: Diagnosis not present

## 2021-09-11 DIAGNOSIS — R279 Unspecified lack of coordination: Secondary | ICD-10-CM | POA: Diagnosis not present

## 2021-09-11 DIAGNOSIS — I452 Bifascicular block: Secondary | ICD-10-CM | POA: Diagnosis not present

## 2021-09-11 DIAGNOSIS — R4182 Altered mental status, unspecified: Secondary | ICD-10-CM | POA: Diagnosis not present

## 2021-09-11 DIAGNOSIS — F32A Depression, unspecified: Secondary | ICD-10-CM | POA: Diagnosis not present

## 2021-09-11 DIAGNOSIS — R296 Repeated falls: Secondary | ICD-10-CM | POA: Diagnosis not present

## 2021-09-11 DIAGNOSIS — I5032 Chronic diastolic (congestive) heart failure: Secondary | ICD-10-CM | POA: Diagnosis not present

## 2021-09-11 DIAGNOSIS — W19XXXA Unspecified fall, initial encounter: Secondary | ICD-10-CM | POA: Diagnosis not present

## 2021-09-11 DIAGNOSIS — I482 Chronic atrial fibrillation, unspecified: Secondary | ICD-10-CM | POA: Diagnosis not present

## 2021-09-11 DIAGNOSIS — Z9181 History of falling: Secondary | ICD-10-CM | POA: Diagnosis not present

## 2021-09-11 DIAGNOSIS — S065X0D Traumatic subdural hemorrhage without loss of consciousness, subsequent encounter: Secondary | ICD-10-CM | POA: Diagnosis not present

## 2021-09-11 DIAGNOSIS — I110 Hypertensive heart disease with heart failure: Secondary | ICD-10-CM | POA: Diagnosis not present

## 2021-09-11 DIAGNOSIS — E1165 Type 2 diabetes mellitus with hyperglycemia: Secondary | ICD-10-CM | POA: Diagnosis not present

## 2021-09-11 DIAGNOSIS — I69222 Dysarthria following other nontraumatic intracranial hemorrhage: Secondary | ICD-10-CM | POA: Diagnosis not present

## 2021-09-11 DIAGNOSIS — I6200 Nontraumatic subdural hemorrhage, unspecified: Secondary | ICD-10-CM | POA: Diagnosis not present

## 2021-09-11 DIAGNOSIS — R8290 Unspecified abnormal findings in urine: Secondary | ICD-10-CM | POA: Diagnosis not present

## 2021-09-11 DIAGNOSIS — Z794 Long term (current) use of insulin: Secondary | ICD-10-CM | POA: Diagnosis not present

## 2021-09-11 DIAGNOSIS — I69920 Aphasia following unspecified cerebrovascular disease: Secondary | ICD-10-CM | POA: Diagnosis not present

## 2021-09-11 DIAGNOSIS — R29810 Facial weakness: Secondary | ICD-10-CM | POA: Diagnosis not present

## 2021-09-11 DIAGNOSIS — R451 Restlessness and agitation: Secondary | ICD-10-CM | POA: Diagnosis not present

## 2021-09-11 DIAGNOSIS — I447 Left bundle-branch block, unspecified: Secondary | ICD-10-CM | POA: Diagnosis not present

## 2021-09-11 DIAGNOSIS — R29708 NIHSS score 8: Secondary | ICD-10-CM | POA: Diagnosis not present

## 2021-09-11 DIAGNOSIS — R410 Disorientation, unspecified: Secondary | ICD-10-CM | POA: Diagnosis not present

## 2021-09-11 DIAGNOSIS — I69220 Aphasia following other nontraumatic intracranial hemorrhage: Secondary | ICD-10-CM | POA: Diagnosis not present

## 2021-09-11 DIAGNOSIS — I69214 Frontal lobe and executive function deficit following other nontraumatic intracranial hemorrhage: Secondary | ICD-10-CM | POA: Diagnosis not present

## 2021-09-11 DIAGNOSIS — R4701 Aphasia: Secondary | ICD-10-CM | POA: Diagnosis not present

## 2021-09-11 DIAGNOSIS — X58XXXA Exposure to other specified factors, initial encounter: Secondary | ICD-10-CM | POA: Diagnosis not present

## 2021-09-11 DIAGNOSIS — I4891 Unspecified atrial fibrillation: Secondary | ICD-10-CM | POA: Diagnosis not present

## 2021-09-11 DIAGNOSIS — E611 Iron deficiency: Secondary | ICD-10-CM | POA: Diagnosis not present

## 2021-09-11 DIAGNOSIS — N390 Urinary tract infection, site not specified: Secondary | ICD-10-CM | POA: Diagnosis not present

## 2021-09-11 DIAGNOSIS — I444 Left anterior fascicular block: Secondary | ICD-10-CM | POA: Diagnosis not present

## 2021-09-14 DIAGNOSIS — E278 Other specified disorders of adrenal gland: Secondary | ICD-10-CM | POA: Diagnosis not present

## 2021-09-14 DIAGNOSIS — I639 Cerebral infarction, unspecified: Secondary | ICD-10-CM | POA: Diagnosis not present

## 2021-09-14 DIAGNOSIS — I6203 Nontraumatic chronic subdural hemorrhage: Secondary | ICD-10-CM | POA: Diagnosis not present

## 2021-09-14 DIAGNOSIS — K7689 Other specified diseases of liver: Secondary | ICD-10-CM | POA: Diagnosis not present

## 2021-09-14 DIAGNOSIS — S32110A Nondisplaced Zone I fracture of sacrum, initial encounter for closed fracture: Secondary | ICD-10-CM | POA: Diagnosis not present

## 2021-09-14 DIAGNOSIS — I69220 Aphasia following other nontraumatic intracranial hemorrhage: Secondary | ICD-10-CM | POA: Diagnosis not present

## 2021-09-14 DIAGNOSIS — R0781 Pleurodynia: Secondary | ICD-10-CM | POA: Diagnosis not present

## 2021-09-14 DIAGNOSIS — E1165 Type 2 diabetes mellitus with hyperglycemia: Secondary | ICD-10-CM | POA: Diagnosis not present

## 2021-09-14 DIAGNOSIS — G9341 Metabolic encephalopathy: Secondary | ICD-10-CM | POA: Diagnosis not present

## 2021-09-14 DIAGNOSIS — Z743 Need for continuous supervision: Secondary | ICD-10-CM | POA: Diagnosis not present

## 2021-09-14 DIAGNOSIS — E1142 Type 2 diabetes mellitus with diabetic polyneuropathy: Secondary | ICD-10-CM | POA: Diagnosis not present

## 2021-09-14 DIAGNOSIS — Z9181 History of falling: Secondary | ICD-10-CM | POA: Diagnosis not present

## 2021-09-14 DIAGNOSIS — N390 Urinary tract infection, site not specified: Secondary | ICD-10-CM | POA: Diagnosis not present

## 2021-09-14 DIAGNOSIS — I6930 Unspecified sequelae of cerebral infarction: Secondary | ICD-10-CM | POA: Diagnosis not present

## 2021-09-14 DIAGNOSIS — R531 Weakness: Secondary | ICD-10-CM | POA: Diagnosis not present

## 2021-09-14 DIAGNOSIS — R279 Unspecified lack of coordination: Secondary | ICD-10-CM | POA: Diagnosis not present

## 2021-09-14 DIAGNOSIS — I5032 Chronic diastolic (congestive) heart failure: Secondary | ICD-10-CM | POA: Diagnosis not present

## 2021-09-14 DIAGNOSIS — I69222 Dysarthria following other nontraumatic intracranial hemorrhage: Secondary | ICD-10-CM | POA: Diagnosis not present

## 2021-09-14 DIAGNOSIS — F0150 Vascular dementia without behavioral disturbance: Secondary | ICD-10-CM | POA: Diagnosis not present

## 2021-09-14 DIAGNOSIS — N281 Cyst of kidney, acquired: Secondary | ICD-10-CM | POA: Diagnosis not present

## 2021-09-14 DIAGNOSIS — I6921 Attention and concentration deficit following other nontraumatic intracranial hemorrhage: Secondary | ICD-10-CM | POA: Diagnosis not present

## 2021-09-14 DIAGNOSIS — R4781 Slurred speech: Secondary | ICD-10-CM | POA: Diagnosis not present

## 2021-09-14 DIAGNOSIS — I2510 Atherosclerotic heart disease of native coronary artery without angina pectoris: Secondary | ICD-10-CM | POA: Diagnosis not present

## 2021-09-14 DIAGNOSIS — F5101 Primary insomnia: Secondary | ICD-10-CM | POA: Diagnosis not present

## 2021-09-14 DIAGNOSIS — N3001 Acute cystitis with hematuria: Secondary | ICD-10-CM | POA: Diagnosis not present

## 2021-09-14 DIAGNOSIS — E785 Hyperlipidemia, unspecified: Secondary | ICD-10-CM | POA: Diagnosis not present

## 2021-09-14 DIAGNOSIS — R471 Dysarthria and anarthria: Secondary | ICD-10-CM | POA: Diagnosis not present

## 2021-09-14 DIAGNOSIS — M79651 Pain in right thigh: Secondary | ICD-10-CM | POA: Diagnosis not present

## 2021-09-14 DIAGNOSIS — K3189 Other diseases of stomach and duodenum: Secondary | ICD-10-CM | POA: Diagnosis not present

## 2021-09-14 DIAGNOSIS — N3000 Acute cystitis without hematuria: Secondary | ICD-10-CM | POA: Diagnosis not present

## 2021-09-14 DIAGNOSIS — F32A Depression, unspecified: Secondary | ICD-10-CM | POA: Diagnosis not present

## 2021-09-14 DIAGNOSIS — S065X9A Traumatic subdural hemorrhage with loss of consciousness of unspecified duration, initial encounter: Secondary | ICD-10-CM | POA: Diagnosis not present

## 2021-09-14 DIAGNOSIS — J449 Chronic obstructive pulmonary disease, unspecified: Secondary | ICD-10-CM | POA: Diagnosis not present

## 2021-09-14 DIAGNOSIS — I4891 Unspecified atrial fibrillation: Secondary | ICD-10-CM | POA: Diagnosis not present

## 2021-09-14 DIAGNOSIS — Z794 Long term (current) use of insulin: Secondary | ICD-10-CM | POA: Diagnosis not present

## 2021-09-14 DIAGNOSIS — E559 Vitamin D deficiency, unspecified: Secondary | ICD-10-CM | POA: Diagnosis not present

## 2021-09-14 DIAGNOSIS — R296 Repeated falls: Secondary | ICD-10-CM | POA: Diagnosis not present

## 2021-09-14 DIAGNOSIS — I10 Essential (primary) hypertension: Secondary | ICD-10-CM | POA: Diagnosis not present

## 2021-09-14 DIAGNOSIS — I6200 Nontraumatic subdural hemorrhage, unspecified: Secondary | ICD-10-CM | POA: Diagnosis not present

## 2021-09-14 DIAGNOSIS — S3219XA Other fracture of sacrum, initial encounter for closed fracture: Secondary | ICD-10-CM | POA: Diagnosis not present

## 2021-09-14 DIAGNOSIS — R52 Pain, unspecified: Secondary | ICD-10-CM | POA: Diagnosis not present

## 2021-09-14 DIAGNOSIS — I480 Paroxysmal atrial fibrillation: Secondary | ICD-10-CM | POA: Diagnosis not present

## 2021-09-14 DIAGNOSIS — S065XAA Traumatic subdural hemorrhage with loss of consciousness status unknown, initial encounter: Secondary | ICD-10-CM | POA: Diagnosis not present

## 2021-09-14 DIAGNOSIS — R410 Disorientation, unspecified: Secondary | ICD-10-CM | POA: Diagnosis not present

## 2021-09-14 DIAGNOSIS — R0789 Other chest pain: Secondary | ICD-10-CM | POA: Diagnosis not present

## 2021-09-14 DIAGNOSIS — I69292 Facial weakness following other nontraumatic intracranial hemorrhage: Secondary | ICD-10-CM | POA: Diagnosis not present

## 2021-09-14 DIAGNOSIS — I6202 Nontraumatic subacute subdural hemorrhage: Secondary | ICD-10-CM | POA: Diagnosis not present

## 2021-09-14 DIAGNOSIS — E440 Moderate protein-calorie malnutrition: Secondary | ICD-10-CM | POA: Diagnosis not present

## 2021-09-14 DIAGNOSIS — F804 Speech and language development delay due to hearing loss: Secondary | ICD-10-CM | POA: Diagnosis not present

## 2021-09-14 DIAGNOSIS — E1169 Type 2 diabetes mellitus with other specified complication: Secondary | ICD-10-CM | POA: Diagnosis not present

## 2021-09-14 DIAGNOSIS — R5383 Other fatigue: Secondary | ICD-10-CM | POA: Diagnosis not present

## 2021-09-14 DIAGNOSIS — I69214 Frontal lobe and executive function deficit following other nontraumatic intracranial hemorrhage: Secondary | ICD-10-CM | POA: Diagnosis not present

## 2021-09-19 DIAGNOSIS — E1165 Type 2 diabetes mellitus with hyperglycemia: Secondary | ICD-10-CM | POA: Diagnosis not present

## 2021-09-19 DIAGNOSIS — Z794 Long term (current) use of insulin: Secondary | ICD-10-CM | POA: Diagnosis not present

## 2021-09-19 DIAGNOSIS — S065XAA Traumatic subdural hemorrhage with loss of consciousness status unknown, initial encounter: Secondary | ICD-10-CM | POA: Diagnosis not present

## 2021-09-19 DIAGNOSIS — N390 Urinary tract infection, site not specified: Secondary | ICD-10-CM | POA: Diagnosis not present

## 2021-09-19 DIAGNOSIS — R52 Pain, unspecified: Secondary | ICD-10-CM | POA: Diagnosis not present

## 2021-09-19 DIAGNOSIS — I10 Essential (primary) hypertension: Secondary | ICD-10-CM | POA: Diagnosis not present

## 2021-09-19 DIAGNOSIS — I639 Cerebral infarction, unspecified: Secondary | ICD-10-CM | POA: Diagnosis not present

## 2021-09-20 DIAGNOSIS — I639 Cerebral infarction, unspecified: Secondary | ICD-10-CM | POA: Diagnosis not present

## 2021-09-20 DIAGNOSIS — E1165 Type 2 diabetes mellitus with hyperglycemia: Secondary | ICD-10-CM | POA: Diagnosis not present

## 2021-09-20 DIAGNOSIS — I10 Essential (primary) hypertension: Secondary | ICD-10-CM | POA: Diagnosis not present

## 2021-09-20 DIAGNOSIS — S065XAA Traumatic subdural hemorrhage with loss of consciousness status unknown, initial encounter: Secondary | ICD-10-CM | POA: Diagnosis not present

## 2021-09-20 DIAGNOSIS — N390 Urinary tract infection, site not specified: Secondary | ICD-10-CM | POA: Diagnosis not present

## 2021-09-24 DIAGNOSIS — G9341 Metabolic encephalopathy: Secondary | ICD-10-CM | POA: Diagnosis not present

## 2021-09-24 DIAGNOSIS — K7689 Other specified diseases of liver: Secondary | ICD-10-CM | POA: Diagnosis not present

## 2021-09-24 DIAGNOSIS — R5381 Other malaise: Secondary | ICD-10-CM | POA: Diagnosis not present

## 2021-09-24 DIAGNOSIS — I6203 Nontraumatic chronic subdural hemorrhage: Secondary | ICD-10-CM | POA: Diagnosis not present

## 2021-09-24 DIAGNOSIS — R410 Disorientation, unspecified: Secondary | ICD-10-CM | POA: Diagnosis not present

## 2021-09-24 DIAGNOSIS — R0781 Pleurodynia: Secondary | ICD-10-CM | POA: Diagnosis not present

## 2021-09-24 DIAGNOSIS — J449 Chronic obstructive pulmonary disease, unspecified: Secondary | ICD-10-CM | POA: Diagnosis not present

## 2021-09-24 DIAGNOSIS — R0789 Other chest pain: Secondary | ICD-10-CM | POA: Diagnosis not present

## 2021-09-24 DIAGNOSIS — R471 Dysarthria and anarthria: Secondary | ICD-10-CM | POA: Diagnosis not present

## 2021-09-24 DIAGNOSIS — I110 Hypertensive heart disease with heart failure: Secondary | ICD-10-CM | POA: Diagnosis not present

## 2021-09-24 DIAGNOSIS — F0150 Vascular dementia without behavioral disturbance: Secondary | ICD-10-CM | POA: Diagnosis not present

## 2021-09-24 DIAGNOSIS — N3001 Acute cystitis with hematuria: Secondary | ICD-10-CM | POA: Diagnosis not present

## 2021-09-24 DIAGNOSIS — I10 Essential (primary) hypertension: Secondary | ICD-10-CM | POA: Diagnosis not present

## 2021-09-24 DIAGNOSIS — Z743 Need for continuous supervision: Secondary | ICD-10-CM | POA: Diagnosis not present

## 2021-09-24 DIAGNOSIS — I6930 Unspecified sequelae of cerebral infarction: Secondary | ICD-10-CM | POA: Diagnosis not present

## 2021-09-24 DIAGNOSIS — R4781 Slurred speech: Secondary | ICD-10-CM | POA: Diagnosis not present

## 2021-09-24 DIAGNOSIS — S32119D Unspecified Zone I fracture of sacrum, subsequent encounter for fracture with routine healing: Secondary | ICD-10-CM | POA: Diagnosis not present

## 2021-09-24 DIAGNOSIS — S3219XA Other fracture of sacrum, initial encounter for closed fracture: Secondary | ICD-10-CM | POA: Diagnosis not present

## 2021-09-24 DIAGNOSIS — G8929 Other chronic pain: Secondary | ICD-10-CM | POA: Diagnosis not present

## 2021-09-24 DIAGNOSIS — R531 Weakness: Secondary | ICD-10-CM | POA: Diagnosis not present

## 2021-09-24 DIAGNOSIS — I2510 Atherosclerotic heart disease of native coronary artery without angina pectoris: Secondary | ICD-10-CM | POA: Diagnosis not present

## 2021-09-24 DIAGNOSIS — S065X9A Traumatic subdural hemorrhage with loss of consciousness of unspecified duration, initial encounter: Secondary | ICD-10-CM | POA: Diagnosis not present

## 2021-09-24 DIAGNOSIS — E1142 Type 2 diabetes mellitus with diabetic polyneuropathy: Secondary | ICD-10-CM | POA: Diagnosis not present

## 2021-09-24 DIAGNOSIS — R5383 Other fatigue: Secondary | ICD-10-CM | POA: Diagnosis not present

## 2021-09-24 DIAGNOSIS — S3210XA Unspecified fracture of sacrum, initial encounter for closed fracture: Secondary | ICD-10-CM | POA: Diagnosis not present

## 2021-09-24 DIAGNOSIS — I5030 Unspecified diastolic (congestive) heart failure: Secondary | ICD-10-CM | POA: Diagnosis not present

## 2021-09-24 DIAGNOSIS — K3189 Other diseases of stomach and duodenum: Secondary | ICD-10-CM | POA: Diagnosis not present

## 2021-09-24 DIAGNOSIS — E278 Other specified disorders of adrenal gland: Secondary | ICD-10-CM | POA: Diagnosis not present

## 2021-09-24 DIAGNOSIS — F804 Speech and language development delay due to hearing loss: Secondary | ICD-10-CM | POA: Diagnosis not present

## 2021-09-24 DIAGNOSIS — N281 Cyst of kidney, acquired: Secondary | ICD-10-CM | POA: Diagnosis not present

## 2021-09-24 DIAGNOSIS — S32110A Nondisplaced Zone I fracture of sacrum, initial encounter for closed fracture: Secondary | ICD-10-CM | POA: Diagnosis not present

## 2021-09-24 DIAGNOSIS — N3000 Acute cystitis without hematuria: Secondary | ICD-10-CM | POA: Diagnosis not present

## 2021-09-24 DIAGNOSIS — F411 Generalized anxiety disorder: Secondary | ICD-10-CM | POA: Diagnosis not present

## 2021-09-24 DIAGNOSIS — Z87820 Personal history of traumatic brain injury: Secondary | ICD-10-CM | POA: Diagnosis not present

## 2021-09-24 DIAGNOSIS — E1140 Type 2 diabetes mellitus with diabetic neuropathy, unspecified: Secondary | ICD-10-CM | POA: Diagnosis not present

## 2021-09-24 DIAGNOSIS — K5904 Chronic idiopathic constipation: Secondary | ICD-10-CM | POA: Diagnosis not present

## 2021-09-24 DIAGNOSIS — S065XAA Traumatic subdural hemorrhage with loss of consciousness status unknown, initial encounter: Secondary | ICD-10-CM | POA: Diagnosis not present

## 2021-09-24 DIAGNOSIS — I480 Paroxysmal atrial fibrillation: Secondary | ICD-10-CM | POA: Diagnosis not present

## 2021-09-24 DIAGNOSIS — I5032 Chronic diastolic (congestive) heart failure: Secondary | ICD-10-CM | POA: Diagnosis not present

## 2021-09-24 DIAGNOSIS — I6200 Nontraumatic subdural hemorrhage, unspecified: Secondary | ICD-10-CM | POA: Diagnosis not present

## 2021-09-24 DIAGNOSIS — R296 Repeated falls: Secondary | ICD-10-CM | POA: Diagnosis not present

## 2021-09-24 DIAGNOSIS — I6202 Nontraumatic subacute subdural hemorrhage: Secondary | ICD-10-CM | POA: Diagnosis not present

## 2021-09-24 DIAGNOSIS — W19XXXD Unspecified fall, subsequent encounter: Secondary | ICD-10-CM | POA: Diagnosis not present

## 2021-09-24 DIAGNOSIS — E440 Moderate protein-calorie malnutrition: Secondary | ICD-10-CM | POA: Diagnosis not present

## 2021-09-28 DIAGNOSIS — Z87820 Personal history of traumatic brain injury: Secondary | ICD-10-CM | POA: Diagnosis not present

## 2021-09-28 DIAGNOSIS — F32A Depression, unspecified: Secondary | ICD-10-CM | POA: Diagnosis not present

## 2021-09-28 DIAGNOSIS — F5101 Primary insomnia: Secondary | ICD-10-CM | POA: Diagnosis not present

## 2021-09-28 DIAGNOSIS — G8929 Other chronic pain: Secondary | ICD-10-CM | POA: Diagnosis not present

## 2021-09-28 DIAGNOSIS — J449 Chronic obstructive pulmonary disease, unspecified: Secondary | ICD-10-CM | POA: Diagnosis not present

## 2021-09-28 DIAGNOSIS — W19XXXD Unspecified fall, subsequent encounter: Secondary | ICD-10-CM | POA: Diagnosis not present

## 2021-09-28 DIAGNOSIS — R5381 Other malaise: Secondary | ICD-10-CM | POA: Diagnosis not present

## 2021-09-28 DIAGNOSIS — I110 Hypertensive heart disease with heart failure: Secondary | ICD-10-CM | POA: Diagnosis not present

## 2021-09-28 DIAGNOSIS — I5030 Unspecified diastolic (congestive) heart failure: Secondary | ICD-10-CM | POA: Diagnosis not present

## 2021-09-28 DIAGNOSIS — S32119D Unspecified Zone I fracture of sacrum, subsequent encounter for fracture with routine healing: Secondary | ICD-10-CM | POA: Diagnosis not present

## 2021-09-28 DIAGNOSIS — Z743 Need for continuous supervision: Secondary | ICD-10-CM | POA: Diagnosis not present

## 2021-09-28 DIAGNOSIS — N3000 Acute cystitis without hematuria: Secondary | ICD-10-CM | POA: Diagnosis not present

## 2021-09-28 DIAGNOSIS — N390 Urinary tract infection, site not specified: Secondary | ICD-10-CM | POA: Diagnosis not present

## 2021-09-28 DIAGNOSIS — E1140 Type 2 diabetes mellitus with diabetic neuropathy, unspecified: Secondary | ICD-10-CM | POA: Diagnosis not present

## 2021-09-28 DIAGNOSIS — R52 Pain, unspecified: Secondary | ICD-10-CM | POA: Diagnosis not present

## 2021-09-28 DIAGNOSIS — E440 Moderate protein-calorie malnutrition: Secondary | ICD-10-CM | POA: Diagnosis not present

## 2021-09-28 DIAGNOSIS — R296 Repeated falls: Secondary | ICD-10-CM | POA: Diagnosis not present

## 2021-09-29 DIAGNOSIS — R296 Repeated falls: Secondary | ICD-10-CM | POA: Diagnosis not present

## 2021-09-29 DIAGNOSIS — N390 Urinary tract infection, site not specified: Secondary | ICD-10-CM | POA: Diagnosis not present

## 2021-09-29 DIAGNOSIS — R52 Pain, unspecified: Secondary | ICD-10-CM | POA: Diagnosis not present

## 2021-10-03 DIAGNOSIS — F32A Depression, unspecified: Secondary | ICD-10-CM | POA: Diagnosis not present

## 2021-10-03 DIAGNOSIS — F5101 Primary insomnia: Secondary | ICD-10-CM | POA: Diagnosis not present

## 2021-10-13 DIAGNOSIS — R52 Pain, unspecified: Secondary | ICD-10-CM | POA: Diagnosis not present

## 2021-10-13 DIAGNOSIS — R296 Repeated falls: Secondary | ICD-10-CM | POA: Diagnosis not present

## 2021-10-13 DIAGNOSIS — I639 Cerebral infarction, unspecified: Secondary | ICD-10-CM | POA: Diagnosis not present

## 2021-10-13 DIAGNOSIS — I10 Essential (primary) hypertension: Secondary | ICD-10-CM | POA: Diagnosis not present

## 2021-10-13 DIAGNOSIS — E1165 Type 2 diabetes mellitus with hyperglycemia: Secondary | ICD-10-CM | POA: Diagnosis not present

## 2021-10-13 DIAGNOSIS — N390 Urinary tract infection, site not specified: Secondary | ICD-10-CM | POA: Diagnosis not present

## 2021-10-13 DIAGNOSIS — Z794 Long term (current) use of insulin: Secondary | ICD-10-CM | POA: Diagnosis not present

## 2021-10-17 DIAGNOSIS — F0390 Unspecified dementia without behavioral disturbance: Secondary | ICD-10-CM | POA: Diagnosis not present

## 2021-10-17 DIAGNOSIS — R410 Disorientation, unspecified: Secondary | ICD-10-CM | POA: Diagnosis not present

## 2021-10-17 DIAGNOSIS — R2990 Unspecified symptoms and signs involving the nervous system: Secondary | ICD-10-CM | POA: Diagnosis not present

## 2021-10-17 DIAGNOSIS — I63233 Cerebral infarction due to unspecified occlusion or stenosis of bilateral carotid arteries: Secondary | ICD-10-CM | POA: Diagnosis not present

## 2021-10-17 DIAGNOSIS — N390 Urinary tract infection, site not specified: Secondary | ICD-10-CM | POA: Diagnosis not present

## 2021-10-17 DIAGNOSIS — I509 Heart failure, unspecified: Secondary | ICD-10-CM | POA: Diagnosis not present

## 2021-10-17 DIAGNOSIS — R4701 Aphasia: Secondary | ICD-10-CM | POA: Diagnosis not present

## 2021-10-17 DIAGNOSIS — R4781 Slurred speech: Secondary | ICD-10-CM | POA: Diagnosis not present

## 2021-10-17 DIAGNOSIS — Z79899 Other long term (current) drug therapy: Secondary | ICD-10-CM | POA: Diagnosis not present

## 2021-10-17 DIAGNOSIS — E1169 Type 2 diabetes mellitus with other specified complication: Secondary | ICD-10-CM | POA: Diagnosis not present

## 2021-10-17 DIAGNOSIS — I110 Hypertensive heart disease with heart failure: Secondary | ICD-10-CM | POA: Diagnosis not present

## 2021-10-17 DIAGNOSIS — I10 Essential (primary) hypertension: Secondary | ICD-10-CM | POA: Diagnosis not present

## 2021-10-17 DIAGNOSIS — E785 Hyperlipidemia, unspecified: Secondary | ICD-10-CM | POA: Diagnosis not present

## 2021-10-17 DIAGNOSIS — I6200 Nontraumatic subdural hemorrhage, unspecified: Secondary | ICD-10-CM | POA: Diagnosis not present

## 2021-10-17 DIAGNOSIS — J984 Other disorders of lung: Secondary | ICD-10-CM | POA: Diagnosis not present

## 2021-10-17 DIAGNOSIS — E119 Type 2 diabetes mellitus without complications: Secondary | ICD-10-CM | POA: Diagnosis not present

## 2021-10-17 DIAGNOSIS — I444 Left anterior fascicular block: Secondary | ICD-10-CM | POA: Diagnosis not present

## 2021-10-17 DIAGNOSIS — I679 Cerebrovascular disease, unspecified: Secondary | ICD-10-CM | POA: Diagnosis not present

## 2021-10-17 DIAGNOSIS — Z794 Long term (current) use of insulin: Secondary | ICD-10-CM | POA: Diagnosis not present

## 2021-10-17 DIAGNOSIS — I491 Atrial premature depolarization: Secondary | ICD-10-CM | POA: Diagnosis not present

## 2021-10-17 DIAGNOSIS — I639 Cerebral infarction, unspecified: Secondary | ICD-10-CM | POA: Diagnosis not present

## 2021-10-17 DIAGNOSIS — G919 Hydrocephalus, unspecified: Secondary | ICD-10-CM | POA: Diagnosis not present

## 2021-10-17 DIAGNOSIS — R001 Bradycardia, unspecified: Secondary | ICD-10-CM | POA: Diagnosis not present

## 2021-10-17 DIAGNOSIS — I517 Cardiomegaly: Secondary | ICD-10-CM | POA: Diagnosis not present

## 2021-10-18 DIAGNOSIS — I639 Cerebral infarction, unspecified: Secondary | ICD-10-CM | POA: Diagnosis not present

## 2021-10-18 DIAGNOSIS — I6200 Nontraumatic subdural hemorrhage, unspecified: Secondary | ICD-10-CM | POA: Diagnosis not present

## 2021-10-18 DIAGNOSIS — E1169 Type 2 diabetes mellitus with other specified complication: Secondary | ICD-10-CM | POA: Diagnosis not present

## 2021-10-18 DIAGNOSIS — N390 Urinary tract infection, site not specified: Secondary | ICD-10-CM | POA: Diagnosis not present

## 2021-10-18 DIAGNOSIS — R2990 Unspecified symptoms and signs involving the nervous system: Secondary | ICD-10-CM | POA: Diagnosis not present

## 2021-10-18 DIAGNOSIS — R001 Bradycardia, unspecified: Secondary | ICD-10-CM | POA: Diagnosis not present

## 2021-10-19 DIAGNOSIS — I639 Cerebral infarction, unspecified: Secondary | ICD-10-CM | POA: Diagnosis not present

## 2021-10-19 DIAGNOSIS — Z794 Long term (current) use of insulin: Secondary | ICD-10-CM | POA: Diagnosis not present

## 2021-10-19 DIAGNOSIS — I69320 Aphasia following cerebral infarction: Secondary | ICD-10-CM | POA: Diagnosis not present

## 2021-10-19 DIAGNOSIS — N390 Urinary tract infection, site not specified: Secondary | ICD-10-CM | POA: Diagnosis not present

## 2021-10-19 DIAGNOSIS — R001 Bradycardia, unspecified: Secondary | ICD-10-CM | POA: Diagnosis not present

## 2021-10-19 DIAGNOSIS — E1169 Type 2 diabetes mellitus with other specified complication: Secondary | ICD-10-CM | POA: Diagnosis not present

## 2021-10-19 DIAGNOSIS — I6200 Nontraumatic subdural hemorrhage, unspecified: Secondary | ICD-10-CM | POA: Diagnosis not present

## 2021-10-20 DIAGNOSIS — I679 Cerebrovascular disease, unspecified: Secondary | ICD-10-CM | POA: Diagnosis not present

## 2021-10-20 DIAGNOSIS — R279 Unspecified lack of coordination: Secondary | ICD-10-CM | POA: Diagnosis not present

## 2021-10-20 DIAGNOSIS — E785 Hyperlipidemia, unspecified: Secondary | ICD-10-CM | POA: Diagnosis not present

## 2021-10-20 DIAGNOSIS — I639 Cerebral infarction, unspecified: Secondary | ICD-10-CM | POA: Diagnosis not present

## 2021-10-20 DIAGNOSIS — E1165 Type 2 diabetes mellitus with hyperglycemia: Secondary | ICD-10-CM | POA: Diagnosis not present

## 2021-10-20 DIAGNOSIS — F0390 Unspecified dementia without behavioral disturbance: Secondary | ICD-10-CM | POA: Diagnosis not present

## 2021-10-20 DIAGNOSIS — I10 Essential (primary) hypertension: Secondary | ICD-10-CM | POA: Diagnosis not present

## 2021-10-20 DIAGNOSIS — Z794 Long term (current) use of insulin: Secondary | ICD-10-CM | POA: Diagnosis not present

## 2021-10-20 DIAGNOSIS — S51011A Laceration without foreign body of right elbow, initial encounter: Secondary | ICD-10-CM | POA: Diagnosis not present

## 2021-10-20 DIAGNOSIS — E1169 Type 2 diabetes mellitus with other specified complication: Secondary | ICD-10-CM | POA: Diagnosis not present

## 2021-10-20 DIAGNOSIS — W19XXXD Unspecified fall, subsequent encounter: Secondary | ICD-10-CM | POA: Diagnosis not present

## 2021-10-20 DIAGNOSIS — Z87820 Personal history of traumatic brain injury: Secondary | ICD-10-CM | POA: Diagnosis not present

## 2021-10-20 DIAGNOSIS — Z743 Need for continuous supervision: Secondary | ICD-10-CM | POA: Diagnosis not present

## 2021-10-20 DIAGNOSIS — R531 Weakness: Secondary | ICD-10-CM | POA: Diagnosis not present

## 2021-10-20 DIAGNOSIS — E119 Type 2 diabetes mellitus without complications: Secondary | ICD-10-CM | POA: Diagnosis not present

## 2021-10-20 DIAGNOSIS — R001 Bradycardia, unspecified: Secondary | ICD-10-CM | POA: Diagnosis not present

## 2021-10-20 DIAGNOSIS — F5101 Primary insomnia: Secondary | ICD-10-CM | POA: Diagnosis not present

## 2021-10-20 DIAGNOSIS — I5042 Chronic combined systolic (congestive) and diastolic (congestive) heart failure: Secondary | ICD-10-CM | POA: Diagnosis not present

## 2021-10-20 DIAGNOSIS — I509 Heart failure, unspecified: Secondary | ICD-10-CM | POA: Diagnosis not present

## 2021-10-20 DIAGNOSIS — I110 Hypertensive heart disease with heart failure: Secondary | ICD-10-CM | POA: Diagnosis not present

## 2021-10-20 DIAGNOSIS — S32119D Unspecified Zone I fracture of sacrum, subsequent encounter for fracture with routine healing: Secondary | ICD-10-CM | POA: Diagnosis not present

## 2021-10-20 DIAGNOSIS — E440 Moderate protein-calorie malnutrition: Secondary | ICD-10-CM | POA: Diagnosis not present

## 2021-10-20 DIAGNOSIS — Z8673 Personal history of transient ischemic attack (TIA), and cerebral infarction without residual deficits: Secondary | ICD-10-CM | POA: Diagnosis not present

## 2021-10-20 DIAGNOSIS — Z79899 Other long term (current) drug therapy: Secondary | ICD-10-CM | POA: Diagnosis not present

## 2021-10-20 DIAGNOSIS — I480 Paroxysmal atrial fibrillation: Secondary | ICD-10-CM | POA: Diagnosis not present

## 2021-10-20 DIAGNOSIS — I6200 Nontraumatic subdural hemorrhage, unspecified: Secondary | ICD-10-CM | POA: Diagnosis not present

## 2021-10-20 DIAGNOSIS — J449 Chronic obstructive pulmonary disease, unspecified: Secondary | ICD-10-CM | POA: Diagnosis not present

## 2021-10-20 DIAGNOSIS — Z8679 Personal history of other diseases of the circulatory system: Secondary | ICD-10-CM | POA: Diagnosis not present

## 2021-10-20 DIAGNOSIS — W19XXXS Unspecified fall, sequela: Secondary | ICD-10-CM | POA: Diagnosis not present

## 2021-10-20 DIAGNOSIS — R471 Dysarthria and anarthria: Secondary | ICD-10-CM | POA: Diagnosis not present

## 2021-10-20 DIAGNOSIS — R4701 Aphasia: Secondary | ICD-10-CM | POA: Diagnosis not present

## 2021-10-20 DIAGNOSIS — I69920 Aphasia following unspecified cerebrovascular disease: Secondary | ICD-10-CM | POA: Diagnosis not present

## 2021-10-20 DIAGNOSIS — E1140 Type 2 diabetes mellitus with diabetic neuropathy, unspecified: Secondary | ICD-10-CM | POA: Diagnosis not present

## 2021-10-20 DIAGNOSIS — I5030 Unspecified diastolic (congestive) heart failure: Secondary | ICD-10-CM | POA: Diagnosis not present

## 2021-10-20 DIAGNOSIS — R296 Repeated falls: Secondary | ICD-10-CM | POA: Diagnosis not present

## 2021-10-20 DIAGNOSIS — N3000 Acute cystitis without hematuria: Secondary | ICD-10-CM | POA: Diagnosis not present

## 2021-10-20 DIAGNOSIS — F32A Depression, unspecified: Secondary | ICD-10-CM | POA: Diagnosis not present

## 2021-10-20 DIAGNOSIS — R52 Pain, unspecified: Secondary | ICD-10-CM | POA: Diagnosis not present

## 2021-10-20 DIAGNOSIS — N390 Urinary tract infection, site not specified: Secondary | ICD-10-CM | POA: Diagnosis not present

## 2021-10-20 DIAGNOSIS — Z87828 Personal history of other (healed) physical injury and trauma: Secondary | ICD-10-CM | POA: Diagnosis not present

## 2021-10-24 DIAGNOSIS — I639 Cerebral infarction, unspecified: Secondary | ICD-10-CM | POA: Diagnosis not present

## 2021-10-24 DIAGNOSIS — F32A Depression, unspecified: Secondary | ICD-10-CM | POA: Diagnosis not present

## 2021-10-24 DIAGNOSIS — E1165 Type 2 diabetes mellitus with hyperglycemia: Secondary | ICD-10-CM | POA: Diagnosis not present

## 2021-10-24 DIAGNOSIS — R4701 Aphasia: Secondary | ICD-10-CM | POA: Diagnosis not present

## 2021-10-24 DIAGNOSIS — E785 Hyperlipidemia, unspecified: Secondary | ICD-10-CM | POA: Diagnosis not present

## 2021-10-24 DIAGNOSIS — I10 Essential (primary) hypertension: Secondary | ICD-10-CM | POA: Diagnosis not present

## 2021-10-24 DIAGNOSIS — F5101 Primary insomnia: Secondary | ICD-10-CM | POA: Diagnosis not present

## 2021-10-24 DIAGNOSIS — E1169 Type 2 diabetes mellitus with other specified complication: Secondary | ICD-10-CM | POA: Diagnosis not present

## 2021-10-30 DIAGNOSIS — I10 Essential (primary) hypertension: Secondary | ICD-10-CM | POA: Diagnosis not present

## 2021-10-30 DIAGNOSIS — N390 Urinary tract infection, site not specified: Secondary | ICD-10-CM | POA: Diagnosis not present

## 2021-10-30 DIAGNOSIS — Z8679 Personal history of other diseases of the circulatory system: Secondary | ICD-10-CM | POA: Diagnosis not present

## 2021-10-30 DIAGNOSIS — Z8673 Personal history of transient ischemic attack (TIA), and cerebral infarction without residual deficits: Secondary | ICD-10-CM | POA: Diagnosis not present

## 2021-10-30 DIAGNOSIS — I480 Paroxysmal atrial fibrillation: Secondary | ICD-10-CM | POA: Diagnosis not present

## 2021-10-30 DIAGNOSIS — R296 Repeated falls: Secondary | ICD-10-CM | POA: Diagnosis not present

## 2021-11-01 DIAGNOSIS — I10 Essential (primary) hypertension: Secondary | ICD-10-CM | POA: Diagnosis not present

## 2021-11-01 DIAGNOSIS — N390 Urinary tract infection, site not specified: Secondary | ICD-10-CM | POA: Diagnosis not present

## 2021-11-01 DIAGNOSIS — Z79899 Other long term (current) drug therapy: Secondary | ICD-10-CM | POA: Diagnosis not present

## 2021-11-01 DIAGNOSIS — R4701 Aphasia: Secondary | ICD-10-CM | POA: Diagnosis not present

## 2021-11-01 DIAGNOSIS — R471 Dysarthria and anarthria: Secondary | ICD-10-CM | POA: Diagnosis not present

## 2021-11-01 DIAGNOSIS — I5042 Chronic combined systolic (congestive) and diastolic (congestive) heart failure: Secondary | ICD-10-CM | POA: Diagnosis not present

## 2021-11-01 DIAGNOSIS — W19XXXS Unspecified fall, sequela: Secondary | ICD-10-CM | POA: Diagnosis not present

## 2021-11-01 DIAGNOSIS — I480 Paroxysmal atrial fibrillation: Secondary | ICD-10-CM | POA: Diagnosis not present

## 2021-11-01 DIAGNOSIS — Z87828 Personal history of other (healed) physical injury and trauma: Secondary | ICD-10-CM | POA: Diagnosis not present

## 2021-11-01 DIAGNOSIS — R531 Weakness: Secondary | ICD-10-CM | POA: Diagnosis not present

## 2021-11-02 DIAGNOSIS — I480 Paroxysmal atrial fibrillation: Secondary | ICD-10-CM | POA: Diagnosis not present

## 2021-11-02 DIAGNOSIS — E1165 Type 2 diabetes mellitus with hyperglycemia: Secondary | ICD-10-CM | POA: Diagnosis not present

## 2021-11-02 DIAGNOSIS — Z79899 Other long term (current) drug therapy: Secondary | ICD-10-CM | POA: Diagnosis not present

## 2021-11-02 DIAGNOSIS — I10 Essential (primary) hypertension: Secondary | ICD-10-CM | POA: Diagnosis not present

## 2021-11-02 DIAGNOSIS — Z794 Long term (current) use of insulin: Secondary | ICD-10-CM | POA: Diagnosis not present

## 2021-11-03 ENCOUNTER — Other Ambulatory Visit (HOSPITAL_COMMUNITY): Payer: Self-pay | Admitting: Nurse Practitioner

## 2021-11-09 DIAGNOSIS — Z79899 Other long term (current) drug therapy: Secondary | ICD-10-CM | POA: Diagnosis not present

## 2021-11-09 DIAGNOSIS — I480 Paroxysmal atrial fibrillation: Secondary | ICD-10-CM | POA: Diagnosis not present

## 2021-11-09 DIAGNOSIS — R296 Repeated falls: Secondary | ICD-10-CM | POA: Diagnosis not present

## 2021-11-09 DIAGNOSIS — S51011A Laceration without foreign body of right elbow, initial encounter: Secondary | ICD-10-CM | POA: Diagnosis not present

## 2021-11-10 DIAGNOSIS — E1165 Type 2 diabetes mellitus with hyperglycemia: Secondary | ICD-10-CM | POA: Diagnosis not present

## 2021-11-10 DIAGNOSIS — R296 Repeated falls: Secondary | ICD-10-CM | POA: Diagnosis not present

## 2021-11-10 DIAGNOSIS — I639 Cerebral infarction, unspecified: Secondary | ICD-10-CM | POA: Diagnosis not present

## 2021-11-10 DIAGNOSIS — E1169 Type 2 diabetes mellitus with other specified complication: Secondary | ICD-10-CM | POA: Diagnosis not present

## 2021-11-10 DIAGNOSIS — R52 Pain, unspecified: Secondary | ICD-10-CM | POA: Diagnosis not present

## 2021-11-10 DIAGNOSIS — E785 Hyperlipidemia, unspecified: Secondary | ICD-10-CM | POA: Diagnosis not present

## 2021-11-10 DIAGNOSIS — R4701 Aphasia: Secondary | ICD-10-CM | POA: Diagnosis not present

## 2021-11-10 DIAGNOSIS — N390 Urinary tract infection, site not specified: Secondary | ICD-10-CM | POA: Diagnosis not present

## 2021-11-10 DIAGNOSIS — I10 Essential (primary) hypertension: Secondary | ICD-10-CM | POA: Diagnosis not present

## 2021-11-16 DIAGNOSIS — R8290 Unspecified abnormal findings in urine: Secondary | ICD-10-CM | POA: Diagnosis not present

## 2021-11-16 DIAGNOSIS — N3000 Acute cystitis without hematuria: Secondary | ICD-10-CM | POA: Diagnosis not present

## 2021-11-16 DIAGNOSIS — F0150 Vascular dementia without behavioral disturbance: Secondary | ICD-10-CM | POA: Diagnosis not present

## 2021-11-16 DIAGNOSIS — I10 Essential (primary) hypertension: Secondary | ICD-10-CM | POA: Diagnosis not present

## 2021-11-16 DIAGNOSIS — Z8673 Personal history of transient ischemic attack (TIA), and cerebral infarction without residual deficits: Secondary | ICD-10-CM | POA: Diagnosis not present

## 2021-11-16 DIAGNOSIS — I639 Cerebral infarction, unspecified: Secondary | ICD-10-CM | POA: Diagnosis not present

## 2021-11-16 DIAGNOSIS — S065X0D Traumatic subdural hemorrhage without loss of consciousness, subsequent encounter: Secondary | ICD-10-CM | POA: Diagnosis not present

## 2021-11-16 DIAGNOSIS — E1165 Type 2 diabetes mellitus with hyperglycemia: Secondary | ICD-10-CM | POA: Diagnosis not present

## 2021-11-16 DIAGNOSIS — I480 Paroxysmal atrial fibrillation: Secondary | ICD-10-CM | POA: Diagnosis not present

## 2021-11-29 DIAGNOSIS — M810 Age-related osteoporosis without current pathological fracture: Secondary | ICD-10-CM | POA: Diagnosis not present

## 2021-11-29 DIAGNOSIS — E1169 Type 2 diabetes mellitus with other specified complication: Secondary | ICD-10-CM | POA: Diagnosis not present

## 2021-11-29 DIAGNOSIS — D3501 Benign neoplasm of right adrenal gland: Secondary | ICD-10-CM | POA: Diagnosis not present

## 2021-11-29 DIAGNOSIS — Z794 Long term (current) use of insulin: Secondary | ICD-10-CM | POA: Diagnosis not present

## 2021-11-29 DIAGNOSIS — D3502 Benign neoplasm of left adrenal gland: Secondary | ICD-10-CM | POA: Diagnosis not present

## 2021-11-29 DIAGNOSIS — E785 Hyperlipidemia, unspecified: Secondary | ICD-10-CM | POA: Diagnosis not present

## 2021-11-29 DIAGNOSIS — E1140 Type 2 diabetes mellitus with diabetic neuropathy, unspecified: Secondary | ICD-10-CM | POA: Diagnosis not present

## 2021-12-02 ENCOUNTER — Other Ambulatory Visit: Payer: Self-pay

## 2021-12-02 ENCOUNTER — Emergency Department (HOSPITAL_COMMUNITY): Payer: Medicare PPO

## 2021-12-02 ENCOUNTER — Emergency Department (HOSPITAL_COMMUNITY)
Admission: EM | Admit: 2021-12-02 | Discharge: 2021-12-02 | Disposition: A | Discharge status: Home / Self Care | Payer: Medicare PPO | Attending: Emergency Medicine | Admitting: Emergency Medicine

## 2021-12-02 ENCOUNTER — Encounter (HOSPITAL_COMMUNITY): Payer: Self-pay

## 2021-12-02 DIAGNOSIS — I509 Heart failure, unspecified: Secondary | ICD-10-CM | POA: Diagnosis not present

## 2021-12-02 DIAGNOSIS — Z794 Long term (current) use of insulin: Secondary | ICD-10-CM | POA: Diagnosis not present

## 2021-12-02 DIAGNOSIS — Z8673 Personal history of transient ischemic attack (TIA), and cerebral infarction without residual deficits: Secondary | ICD-10-CM | POA: Insufficient documentation

## 2021-12-02 DIAGNOSIS — N3090 Cystitis, unspecified without hematuria: Secondary | ICD-10-CM

## 2021-12-02 DIAGNOSIS — R531 Weakness: Secondary | ICD-10-CM | POA: Diagnosis not present

## 2021-12-02 DIAGNOSIS — I10 Essential (primary) hypertension: Secondary | ICD-10-CM | POA: Diagnosis not present

## 2021-12-02 DIAGNOSIS — E119 Type 2 diabetes mellitus without complications: Secondary | ICD-10-CM | POA: Insufficient documentation

## 2021-12-02 DIAGNOSIS — J449 Chronic obstructive pulmonary disease, unspecified: Secondary | ICD-10-CM | POA: Diagnosis not present

## 2021-12-02 DIAGNOSIS — R4182 Altered mental status, unspecified: Secondary | ICD-10-CM | POA: Diagnosis not present

## 2021-12-02 DIAGNOSIS — R55 Syncope and collapse: Secondary | ICD-10-CM | POA: Insufficient documentation

## 2021-12-02 DIAGNOSIS — R079 Chest pain, unspecified: Secondary | ICD-10-CM | POA: Diagnosis not present

## 2021-12-02 DIAGNOSIS — Z79899 Other long term (current) drug therapy: Secondary | ICD-10-CM | POA: Diagnosis not present

## 2021-12-02 DIAGNOSIS — R410 Disorientation, unspecified: Secondary | ICD-10-CM | POA: Diagnosis not present

## 2021-12-02 DIAGNOSIS — I110 Hypertensive heart disease with heart failure: Secondary | ICD-10-CM | POA: Insufficient documentation

## 2021-12-02 DIAGNOSIS — I2510 Atherosclerotic heart disease of native coronary artery without angina pectoris: Secondary | ICD-10-CM | POA: Diagnosis not present

## 2021-12-02 DIAGNOSIS — R1032 Left lower quadrant pain: Secondary | ICD-10-CM | POA: Diagnosis not present

## 2021-12-02 DIAGNOSIS — R5383 Other fatigue: Secondary | ICD-10-CM | POA: Diagnosis not present

## 2021-12-02 DIAGNOSIS — R42 Dizziness and giddiness: Secondary | ICD-10-CM | POA: Diagnosis not present

## 2021-12-02 DIAGNOSIS — R519 Headache, unspecified: Secondary | ICD-10-CM | POA: Diagnosis not present

## 2021-12-02 LAB — COMPREHENSIVE METABOLIC PANEL
ALT: 17 U/L (ref 0–44)
AST: 18 U/L (ref 15–41)
Albumin: 3.6 g/dL (ref 3.5–5.0)
Alkaline Phosphatase: 58 U/L (ref 38–126)
Anion gap: 5 (ref 5–15)
BUN: 23 mg/dL (ref 8–23)
CO2: 28 mmol/L (ref 22–32)
Calcium: 8.9 mg/dL (ref 8.9–10.3)
Chloride: 103 mmol/L (ref 98–111)
Creatinine, Ser: 0.72 mg/dL (ref 0.44–1.00)
GFR, Estimated: >60 mL/min (ref >60)
Glucose, Bld: 221 mg/dL — ABNORMAL HIGH (ref 70–99)
Potassium: 3.9 mmol/L (ref 3.5–5.1)
Sodium: 136 mmol/L (ref 135–145)
Total Bilirubin: 0.8 mg/dL (ref 0.3–1.2)
Total Protein: 6.1 g/dL — ABNORMAL LOW (ref 6.5–8.1)

## 2021-12-02 LAB — PROTIME-INR
INR: 1.3 — ABNORMAL HIGH (ref 0.8–1.2)
Prothrombin Time: 15.6 seconds — ABNORMAL HIGH (ref 11.4–15.2)

## 2021-12-02 LAB — URINALYSIS, ROUTINE W REFLEX MICROSCOPIC
Bilirubin Urine: NEGATIVE
Glucose, UA: >=500 mg/dL — AB
Ketones, ur: NEGATIVE mg/dL
Nitrite: NEGATIVE
Protein, ur: NEGATIVE mg/dL
Specific Gravity, Urine: <1.005 — ABNORMAL LOW (ref 1.005–1.030)
pH: 7 (ref 5.0–8.0)

## 2021-12-02 LAB — CBC
HCT: 43.8 % (ref 36.0–46.0)
Hemoglobin: 14.6 g/dL (ref 12.0–15.0)
MCH: 31.5 pg (ref 26.0–34.0)
MCHC: 33.3 g/dL (ref 30.0–36.0)
MCV: 94.6 fL (ref 80.0–100.0)
Platelets: 199 10*3/uL (ref 150–400)
RBC: 4.63 MIL/uL (ref 3.87–5.11)
RDW: 12.7 % (ref 11.5–15.5)
WBC: 5.5 10*3/uL (ref 4.0–10.5)
nRBC: 0 % (ref 0.0–0.2)

## 2021-12-02 LAB — DIFFERENTIAL
Abs Immature Granulocytes: 0.01 10*3/uL (ref 0.00–0.07)
Basophils Absolute: 0 10*3/uL (ref 0.0–0.1)
Basophils Relative: 0 %
Eosinophils Absolute: 0.1 10*3/uL (ref 0.0–0.5)
Eosinophils Relative: 1 %
Immature Granulocytes: 0 %
Lymphocytes Relative: 31 %
Lymphs Abs: 1.7 10*3/uL (ref 0.7–4.0)
Monocytes Absolute: 0.5 10*3/uL (ref 0.1–1.0)
Monocytes Relative: 8 %
Neutro Abs: 3.3 10*3/uL (ref 1.7–7.7)
Neutrophils Relative %: 60 %

## 2021-12-02 LAB — RAPID URINE DRUG SCREEN, HOSP PERFORMED
Amphetamines: NOT DETECTED
Barbiturates: NOT DETECTED
Benzodiazepines: NOT DETECTED
Cocaine: NOT DETECTED
Opiates: NOT DETECTED
Tetrahydrocannabinol: NOT DETECTED

## 2021-12-02 LAB — URINALYSIS, MICROSCOPIC (REFLEX)

## 2021-12-02 LAB — CBG MONITORING, ED: Glucose-Capillary: 59 mg/dL — ABNORMAL LOW (ref 70–99)

## 2021-12-02 LAB — TROPONIN I (HIGH SENSITIVITY)
Troponin I (High Sensitivity): 11 ng/L (ref <18)
Troponin I (High Sensitivity): 11 ng/L (ref <18)

## 2021-12-02 LAB — ETHANOL: Alcohol, Ethyl (B): <10 mg/dL (ref <10)

## 2021-12-02 LAB — BRAIN NATRIURETIC PEPTIDE: B Natriuretic Peptide: 83.3 pg/mL (ref 0.0–100.0)

## 2021-12-02 LAB — MAGNESIUM: Magnesium: 2.1 mg/dL (ref 1.7–2.4)

## 2021-12-02 MED ORDER — DEXTROSE 50 % IV SOLN
1.0000 | Freq: Once | INTRAVENOUS | Status: AC
  Administered 2021-12-02: 50 mL via INTRAVENOUS
  Filled 2021-12-02: qty 50

## 2021-12-02 MED ORDER — SODIUM CHLORIDE (PF) 0.9 % IJ SOLN
INTRAMUSCULAR | Status: AC
  Filled 2021-12-02: qty 50

## 2021-12-02 MED ORDER — FOSFOMYCIN TROMETHAMINE 3 G PO PACK
3.0000 g | PACK | Freq: Once | ORAL | Status: AC
  Administered 2021-12-02: 3 g via ORAL
  Filled 2021-12-02: qty 3

## 2021-12-02 MED ORDER — IOHEXOL 350 MG/ML SOLN
100.0000 mL | Freq: Once | INTRAVENOUS | Status: AC | PRN
  Administered 2021-12-02: 100 mL via INTRAVENOUS

## 2021-12-02 MED ORDER — SODIUM CHLORIDE 0.9 % IV SOLN: 1.0000 g | Freq: Once | INTRAVENOUS | Status: DC

## 2021-12-02 MED ORDER — LACTATED RINGERS IV BOLUS
1000.0000 mL | Freq: Once | INTRAVENOUS | Status: AC
  Administered 2021-12-02: 1000 mL via INTRAVENOUS

## 2021-12-02 NOTE — ED Triage Notes (Signed)
EMS reports sent from Urgent Care, seen there for lethargy and Home health nurse stated lungs sounds needed assessment. Near syncopal episode at Urgent Care CBG on site 81. Sent for assessment. Given 84m D-10 enroute  BP 140/76 HR 60 Sp02 98 RA CBG 136 post 186mD-10 enroute.  18 LAC

## 2021-12-02 NOTE — Discharge Instructions (Signed)
Follow-up with your primary care doctor for results of urine culture.  These results will be available in 2 to 3 days.  Return to the emergency department for any new or worsening symptoms of concern.

## 2021-12-02 NOTE — ED Provider Notes (Signed)
[facility] DEPT Provider Note   CSN: [id number] Arrival date & time: 12/02/21  1557     History  Chief Complaint  Patient presents with   Weakness   Near Syncope    Melanie Lucas is a 71 y.o. female.  HPI Patient presents for lethargy.  Medical history includes HTN, atrial fibrillation, CAD, pulmonary HTN, T2DM, CVA, SDH, COPD, depression, CHF, anxiety.  Currently, patient lives at home with caregiver support.  Caregiver was concerned about some abnormal lung sounds and sent her to urgent care.  While she was urgent care, she had a witnessed near syncopal episode.  They were concerned about her blood sugar, which was measured at 77.  They called EMS.  EMS gave a small amount of D10 IV fluid.  Currently, patient endorses feeling cold.  She describes chronic right-sided deficits from 5 prior strokes.  She does feel that her speech is slower than normal.  She denies any areas of current discomfort.  Per chart review, her most recent CVA was only 2 weeks ago. She was seen in endocrine office 3 days ago.  She did undergo some medication changes at that time.  Current prescribed regimen is Trijardy XR 12.5/2.06/998 mg twice a day with meals; Lantus 8 units at bedtime.  She was also initiated on antibiotics for treatment of UTI at that time.  History per daughter: Patient lives at home with family support.  She has been in and out of hospital/rehab facilities over the past 6 months.  Most recently, she was discharged from rehab facility 3 weeks ago.  She has been working with her PCP for home health care.  Currently, there is a nurse that only stops by once every 2 to 3 weeks.  They are working on getting her more frequent home health care support.  She initially went to urgent care for concern of left lower quadrant abdominal pain.  She does have history of diverticulitis.  She is typically constipated but has had some episodes of diarrhea lately.  Additionally, home  health nurse stopped by today and felt like there were some crackles on her lungs.  At urgent care, there was no testing performed other than checking her blood sugar.  It was 77, which is low for her.  Although she has recently been prescribed a new diabetes regimen, she has not initiated this yet.  She did eat cereal this morning.  Her blood sugars have been running lower than normal, in the range of 100 lately.  Daughter is concerned of pneumonia and/or diverticulitis.  She confirms the patient is currently at her mental baseline.  She confirms the patient's speech is baseline.    Home Medications Prior to Admission medications   Medication Sig Start Date End Date Taking? Authorizing Provider  acetaminophen (TYLENOL) 325 MG tablet Take 2 tablets (650 mg total) by mouth every 6 (six) hours as needed for mild pain (or Fever >/= 101). 09/04/21   Elgergawy, Silver Huguenin, MD  albuterol (VENTOLIN HFA) 108 (90 Base) MCG/ACT inhaler Inhale 2 puffs into the lungs every 6 (six) hours as needed for wheezing or shortness of breath.    [provider]  dofetilide (TIKOSYN) 250 MCG capsule Take 1 capsule (250 mcg total) by mouth 2 (two) times daily. 08/09/21   Sherran Needs, NP  DULoxetine (CYMBALTA) 30 MG capsule Take 30 mg by mouth at bedtime. 08/02/21   [provider]  furosemide (LASIX) 20 MG tablet Take 20 mg by  mouth daily.    [provider]  insulin aspart (NOVOLOG) 100 UNIT/ML injection Inject 0-15 Units into the skin 3 (three) times daily with meals. 09/04/21   Elgergawy, Silver Huguenin, MD  insulin aspart (NOVOLOG) 100 UNIT/ML injection Inject 0-5 Units into the skin at bedtime. 09/04/21   Elgergawy, Silver Huguenin, MD  insulin glargine-yfgn (SEMGLEE) 100 UNIT/ML injection Inject 0.08 mLs (8 Units total) into the skin daily. 09/05/21   Elgergawy, Silver Huguenin, MD  linaclotide (LINZESS) 290 MCG CAPS capsule Take 290 mcg by mouth daily before breakfast.    [provider]  losartan  (COZAAR) 50 MG tablet TAKE 1 TABLET BY MOUTH EVERY DAY Patient taking differently: Take 50 mg by mouth daily. 03/20/21   Allred, Jeneen Rinks, MD  melatonin 5 MG TABS Take 1 tablet (5 mg total) by mouth at bedtime. 09/04/21   Elgergawy, Silver Huguenin, MD  mirabegron ER (MYRBETRIQ) 50 MG TB24 tablet Take 50 mg by mouth at bedtime.    [provider]  Multiple Vitamin (MULTIVITAMIN) tablet Take 1 tablet by mouth at bedtime.     [provider]  potassium chloride SA (KLOR-CON M) 20 MEQ tablet Take 1 tablet (20 mEq total) by mouth daily. 08/01/21   Dwyane Dee, MD  QUEtiapine (SEROQUEL) 25 MG tablet Take 1 tablet (25 mg total) by mouth at bedtime. 09/04/21   Elgergawy, Silver Huguenin, MD      Allergies    Alendronate sodium, Avelox [moxifloxacin], Fosamax [alendronate], Pradaxa [dabigatran etexilate mesylate], Statins, Sulfa antibiotics, Sulfamethoxazole, Sulfonamide derivatives, Varenicline, Ciprofloxacin, Gabapentin, and Tape    Review of Systems   Review of Systems  Constitutional:  Positive for activity change.  Neurological:  Positive for speech difficulty and weakness (Generalized).       Near syncope  All other systems reviewed and are negative.   Physical Exam Updated Vital Signs BP (!) 164/78 (BP Location: Right Arm)   Pulse 65   Temp 98 F (36.7 C) (Oral)   Resp 17   SpO2 97%  Physical Exam Vitals and nursing note reviewed.  Constitutional:      General: She is not in acute distress.    Appearance: Normal appearance. She is well-developed. She is not ill-appearing, toxic-appearing or diaphoretic.  HENT:     Head: Normocephalic and atraumatic.     Right Ear: External ear normal.     Left Ear: External ear normal.     Nose: Nose normal.     Mouth/Throat:     Mouth: Mucous membranes are moist.     Pharynx: Oropharynx is clear.  Eyes:     Extraocular Movements: Extraocular movements intact.     Conjunctiva/sclera: Conjunctivae normal.  Cardiovascular:     Rate and  Rhythm: Normal rate and regular rhythm.     Heart sounds: No murmur heard. Pulmonary:     Effort: Pulmonary effort is normal. No respiratory distress.     Breath sounds: Normal breath sounds. No wheezing or rales.  Chest:     Chest wall: No tenderness.  Abdominal:     General: There is no distension.     Palpations: Abdomen is soft.     Tenderness: There is no abdominal tenderness.  Musculoskeletal:        General: No swelling. Normal range of motion.     Cervical back: Normal range of motion and neck supple.     Right lower leg: Edema present.     Left lower leg: Edema present.  Skin:  General: Skin is warm.     Coloration: Skin is not jaundiced or pale.  Neurological:     General: No focal deficit present.     Mental Status: She is alert and oriented to person, place, and time.     Cranial Nerves: No cranial nerve deficit or facial asymmetry.     Sensory: Sensation is intact. No sensory deficit.     Motor: Motor function is intact. No weakness, abnormal muscle tone or pronator drift.     Coordination: Coordination is intact. Coordination normal.  Psychiatric:        Mood and Affect: Mood normal.        Behavior: Behavior is slowed.        Thought Content: Thought content normal.        Judgment: Judgment normal.     ED Results / Procedures / Treatments   Labs (all labs ordered are listed, but only abnormal results are displayed) Labs Reviewed  PROTIME-INR - Abnormal; Notable for the following components:      Result Value   Prothrombin Time 15.6 (*)    INR 1.3 (*)    All other components within normal limits  COMPREHENSIVE METABOLIC PANEL - Abnormal; Notable for the following components:   Glucose, Bld 221 (*)    Total Protein 6.1 (*)    All other components within normal limits  URINALYSIS, ROUTINE W REFLEX MICROSCOPIC - Abnormal; Notable for the following components:   APPearance HAZY (*)    Specific Gravity, Urine <1.005 (*)    Glucose, UA >=500 (*)    Hgb  urine dipstick TRACE (*)    Leukocytes,Ua MODERATE (*)    All other components within normal limits  URINALYSIS, MICROSCOPIC (REFLEX) - Abnormal; Notable for the following components:   Bacteria, UA RARE (*)    Non Squamous Epithelial PRESENT (*)    All other components within normal limits  CBG MONITORING, ED - Abnormal; Notable for the following components:   Glucose-Capillary 59 (*)    All other components within normal limits  URINE CULTURE  CBC  DIFFERENTIAL  ETHANOL  RAPID URINE DRUG SCREEN, HOSP PERFORMED  MAGNESIUM  BRAIN NATRIURETIC PEPTIDE  TROPONIN I (HIGH SENSITIVITY)  TROPONIN I (HIGH SENSITIVITY)    EKG EKG Interpretation  Date/Time:  Saturday December 02 2021 18:03:19 EDT Ventricular Rate:  51 PR Interval:  134 QRS Duration: 129 QT Interval:  490 QTC Calculation: 452 R Axis:   -77 Text Interpretation: Sinus rhythm RBBB and LAFB Probable left ventricular hypertrophy Confirmed by Godfrey Pick ([id number]) on 12/02/2021 10:39:39 PM  Radiology CT Angio Chest PE W and/or Wo Contrast  Result Date: 12/02/2021 CLINICAL DATA:  Fatigue, abnormal lung exam, left lower quadrant abdominal pain, weakness. EXAM: CT ANGIOGRAPHY CHEST CT ABDOMEN AND PELVIS WITH CONTRAST TECHNIQUE: Multidetector CT imaging of the chest was performed using the standard protocol during bolus administration of intravenous contrast. Multiplanar CT image reconstructions and MIPs were obtained to evaluate the vascular anatomy. Multidetector CT imaging of the abdomen and pelvis was performed using the standard protocol during bolus administration of intravenous contrast. RADIATION DOSE REDUCTION: This exam was performed according to the departmental dose-optimization program which includes automated exposure control, adjustment of the mA and/or kV according to patient size and/or use of iterative reconstruction technique. CONTRAST:  12m OMNIPAQUE IOHEXOL 350 MG/ML SOLN COMPARISON:  Chest radiograph dated  12/02/2021. CT abdomen/pelvis dated 08/31/2021. CT chest dated 03/06/2021. FINDINGS: CTA CHEST FINDINGS Cardiovascular: Satisfactory opacification the bilateral pulmonary arteries to  the segmental level. No evidence of pulmonary embolism. Although not tailored for evaluation of the thoracic aorta, there is no evidence thoracic aortic aneurysm or dissection. The heart is normal in size.  No pericardial effusion. Three vessel coronary atherosclerosis. Mediastinum/Nodes: No suspicious mediastinal lymphadenopathy. Visualized thyroid is unremarkable. Lungs/Pleura: Evaluation of the lung parenchyma is constrained by respiratory motion. Within that constraint, there are no suspicious pulmonary nodules. Very mild atelectasis in the lingula and bilateral lower lobes. No focal consolidation. No pleural effusion or pneumothorax. Musculoskeletal: Cervical spine fixation hardware, incompletely visualized. Mild degenerative changes of the thoracic spine. Review of the MIP images confirms the above findings. CT ABDOMEN and PELVIS FINDINGS Hepatobiliary: Subcentimeter right hepatic cyst (series 4/image 14). Liver is otherwise within normal limits. Gallbladder is unremarkable. No intrahepatic or extrahepatic ductal dilatation. Pancreas: Within normal limits. Spleen: Within normal limits. Adrenals/Urinary Tract: Bilateral adrenal adenomas, measuring 5.0 cm on the right and 3.9 cm on the left, previously characterized definitively in unchanged on multiple priors, benign. No follow-up is recommended. 4.2 cm fluid density cyst in the lateral right upper kidney (series 4/image 27), benign (Bosniak I). No follow-up is recommended. Left kidney is within normal limits. No hydronephrosis. Thick-walled bladder with mild mucosal hyperenhancement. Stomach/Bowel: Stomach is within normal limits. No evidence of bowel obstruction. Normal appendix (series 4/image 60). No colonic wall thickening or inflammatory changes. Vascular/Lymphatic: No  evidence of abdominal aortic aneurysm. Atherosclerotic calcifications of the abdominal aorta and branch vessels. The lymph nodes Reproductive: Uterus is within normal limits. Bilateral ovaries are within normal limits. Other: No abdominopelvic ascites. Musculoskeletal: Mild degenerative changes of the lumbar spine. Review of the MIP images confirms the above findings. IMPRESSION: No evidence of pulmonary embolism. No evidence of acute cardiopulmonary disease. Thick-walled bladder, correlate for cystitis. Additional stable ancillary findings as above. Electronically Signed   By: Julian Hy M.D.   On: 12/02/2021 19:43   CT ABDOMEN PELVIS W CONTRAST  Result Date: 12/02/2021 CLINICAL DATA:  Fatigue, abnormal lung exam, left lower quadrant abdominal pain, weakness. EXAM: CT ANGIOGRAPHY CHEST CT ABDOMEN AND PELVIS WITH CONTRAST TECHNIQUE: Multidetector CT imaging of the chest was performed using the standard protocol during bolus administration of intravenous contrast. Multiplanar CT image reconstructions and MIPs were obtained to evaluate the vascular anatomy. Multidetector CT imaging of the abdomen and pelvis was performed using the standard protocol during bolus administration of intravenous contrast. RADIATION DOSE REDUCTION: This exam was performed according to the departmental dose-optimization program which includes automated exposure control, adjustment of the mA and/or kV according to patient size and/or use of iterative reconstruction technique. CONTRAST:  179m OMNIPAQUE IOHEXOL 350 MG/ML SOLN COMPARISON:  Chest radiograph dated 12/02/2021. CT abdomen/pelvis dated 08/31/2021. CT chest dated 03/06/2021. FINDINGS: CTA CHEST FINDINGS Cardiovascular: Satisfactory opacification the bilateral pulmonary arteries to the segmental level. No evidence of pulmonary embolism. Although not tailored for evaluation of the thoracic aorta, there is no evidence thoracic aortic aneurysm or dissection. The heart is  normal in size.  No pericardial effusion. Three vessel coronary atherosclerosis. Mediastinum/Nodes: No suspicious mediastinal lymphadenopathy. Visualized thyroid is unremarkable. Lungs/Pleura: Evaluation of the lung parenchyma is constrained by respiratory motion. Within that constraint, there are no suspicious pulmonary nodules. Very mild atelectasis in the lingula and bilateral lower lobes. No focal consolidation. No pleural effusion or pneumothorax. Musculoskeletal: Cervical spine fixation hardware, incompletely visualized. Mild degenerative changes of the thoracic spine. Review of the MIP images confirms the above findings. CT ABDOMEN and PELVIS FINDINGS Hepatobiliary: Subcentimeter right hepatic cyst (series  4/image 14). Liver is otherwise within normal limits. Gallbladder is unremarkable. No intrahepatic or extrahepatic ductal dilatation. Pancreas: Within normal limits. Spleen: Within normal limits. Adrenals/Urinary Tract: Bilateral adrenal adenomas, measuring 5.0 cm on the right and 3.9 cm on the left, previously characterized definitively in unchanged on multiple priors, benign. No follow-up is recommended. 4.2 cm fluid density cyst in the lateral right upper kidney (series 4/image 27), benign (Bosniak I). No follow-up is recommended. Left kidney is within normal limits. No hydronephrosis. Thick-walled bladder with mild mucosal hyperenhancement. Stomach/Bowel: Stomach is within normal limits. No evidence of bowel obstruction. Normal appendix (series 4/image 60). No colonic wall thickening or inflammatory changes. Vascular/Lymphatic: No evidence of abdominal aortic aneurysm. Atherosclerotic calcifications of the abdominal aorta and branch vessels. The lymph nodes Reproductive: Uterus is within normal limits. Bilateral ovaries are within normal limits. Other: No abdominopelvic ascites. Musculoskeletal: Mild degenerative changes of the lumbar spine. Review of the MIP images confirms the above findings.  IMPRESSION: No evidence of pulmonary embolism. No evidence of acute cardiopulmonary disease. Thick-walled bladder, correlate for cystitis. Additional stable ancillary findings as above. Electronically Signed   By: Julian Hy M.D.   On: 12/02/2021 19:43   CT HEAD WO CONTRAST  Result Date: 12/02/2021 CLINICAL DATA:  Mental status changes.  Weakness. EXAM: CT HEAD WITHOUT CONTRAST TECHNIQUE: Contiguous axial images were obtained from the base of the skull through the vertex without intravenous contrast. RADIATION DOSE REDUCTION: This exam was performed according to the departmental dose-optimization program which includes automated exposure control, adjustment of the mA and/or kV according to patient size and/or use of iterative reconstruction technique. COMPARISON:  10/17/2021 FINDINGS: Brain: There is no evidence for acute hemorrhage, hydrocephalus, mass lesion, or abnormal extra-axial fluid collection. No definite CT evidence for acute infarction. Diffuse loss of parenchymal volume is consistent with atrophy. Patchy low attenuation in the deep hemispheric and periventricular white matter is nonspecific, but likely reflects chronic microvascular ischemic demyelination. Stable ventriculomegaly. Vascular: No hyperdense vessel or unexpected calcification. Skull: No evidence for fracture. No worrisome lytic or sclerotic lesion. Sinuses/Orbits: The visualized paranasal sinuses and mastoid air cells are clear. Visualized portions of the globes and intraorbital fat are unremarkable. Other: None. IMPRESSION: 1. No acute intracranial abnormality. 2. Atrophy with advanced chronic white matter small vessel ischemic disease. 3. Stable ventriculomegaly. Electronically Signed   By: Misty Stanley M.D.   On: 12/02/2021 17:21   DG Chest Portable 1 View  Result Date: 12/02/2021 CLINICAL DATA:  Fatigue.  Abnormal lung exam. EXAM: PORTABLE CHEST 1 VIEW COMPARISON:  10/17/2021 FINDINGS: Chronic cardiomegaly and aortic  atherosclerosis. Slight worsening of patchy density at both lung bases. This could be atelectasis or mild bibasilar pneumonia. No evidence of heart failure or effusion. IMPRESSION: Slight worsening of patchy density at both lung bases. This could be atelectasis or mild bibasilar pneumonia. Electronically Signed   By: Nelson Chimes M.D.   On: 12/02/2021 16:58    Procedures Procedures    Medications Ordered in ED Medications  sodium chloride (PF) 0.9 % injection (has no administration in time range)  dextrose 50 % solution 50 mL (50 mLs Intravenous Given 12/02/21 1624)  lactated ringers bolus 1,000 mL (0 mLs Intravenous Stopped 12/02/21 1752)  iohexol (OMNIPAQUE) 350 MG/ML injection 100 mL (100 mLs Intravenous Contrast Given 12/02/21 1838)  fosfomycin (MONUROL) packet 3 g (3 g Oral Given 12/02/21 2241)    ED Course/ Medical Decision Making/ A&P  Medical Decision Making Amount and/or Complexity of Data Reviewed Labs: ordered. Radiology: ordered.  Risk Prescription drug management.   This patient presents to the ED for concern of near syncope, this involves an extensive number of treatment options, and is a complaint that carries with it a high risk of complications and morbidity.  The differential diagnosis includes hypoglycemia, dehydration, vasovagal episode, pneumonia, polypharmacy, arrhythmia   Co morbidities that complicate the patient evaluation  HTN, atrial fibrillation, CAD, pulmonary HTN, T2DM, CVA, SDH, COPD, depression, CHF, anxiety   Additional history obtained:  Additional history obtained from patient's daughter External records from outside source obtained and reviewed including EMR   Lab Tests:  I Ordered, and personally interpreted labs.  The pertinent results include: Initial glucose was low at 59.  This had sustained resolution following D50.  She has normal hemoglobin and no leukocytosis.  Electrolytes are normal.  BNP and troponin  are normal as well.  Urinalysis shows bacteria and pyuria with trace hematuria.   Imaging Studies ordered:  I ordered imaging studies including chest x-ray, CT head, CTA chest, CT of abdomen and pelvis I independently visualized and interpreted imaging which showed no acute findings other than a thickened bladder wall. I agree with the radiologist interpretation   Cardiac Monitoring: / EKG:  The patient was maintained on a cardiac monitor.  I personally viewed and interpreted the cardiac monitored which showed an underlying rhythm of: Sinus rhythm   Problem List / ED Course / Critical interventions / Medication management  Patient is a 71 year old female with history of multiple CVA, presenting from urgent care for near syncopal episode.  Currently, she lives at home with family and home health aide support.  History is provided primarily by her daughter.  Patient's daughter reports that she has endorsed some left lower quadrant abdominal pain recently.  She does have history of diverticulitis.  It was for this reason that she initially went to urgent care.  While urgent care, she endorsed feeling "faint".  For this reason, she was sent to the ED.  Although her home health nurse felt like she had crackles on lung auscultation, I do not appreciate this on exam.  Her breathing is unlabored and her SPO2 is normal on room air.  Her abdomen is soft but she does endorse some mild left lower quadrant abdominal tenderness.  Patient serum lab work is reassuring.  Her hemoglobin is normal and she has no leukocytosis.  Electrolytes and cardiac enzymes are normal.  Given her multiple symptoms that ultimately led to her arrival in the ED, patient underwent CT imaging of head, chest, abdomen, and pelvis.  There is no evidence of diverticulitis.  There are no areas of lung opacification.  She did, however, have thickened gallbladder wall concerning for cystitis.  On urinalysis, she does have pyuria and bacteria.   At baseline, she is incontinent and does have a history of recurrent UTIs.  Patient was given a dose of fosfomycin in the ED.  She was advised to follow-up with her primary care doctor for results of cultures.  She was discharged in stable condition. I ordered medication including IV fluid for hydration; D50 for hypoglycemia; fosfomycin for UTI Reevaluation of the patient after these medicines showed that the patient improved I have reviewed the patients home medicines and have made adjustments as needed   Social Determinants of Health:  History of multiple CVA with residual deficits, lives at home with family support  Final Clinical Impression(s) / ED Diagnoses Final diagnoses:  Cystitis    Rx / DC Orders ED Discharge Orders     None         Godfrey Pick, MD 12/02/21 2343

## 2021-12-04 LAB — URINE CULTURE: Culture: NO GROWTH

## 2021-12-14 DIAGNOSIS — N3946 Mixed incontinence: Secondary | ICD-10-CM | POA: Diagnosis not present

## 2021-12-14 DIAGNOSIS — N3281 Overactive bladder: Secondary | ICD-10-CM | POA: Diagnosis not present

## 2021-12-14 DIAGNOSIS — N390 Urinary tract infection, site not specified: Secondary | ICD-10-CM | POA: Diagnosis not present

## 2021-12-14 DIAGNOSIS — N3944 Nocturnal enuresis: Secondary | ICD-10-CM | POA: Diagnosis not present

## 2021-12-14 DIAGNOSIS — I10 Essential (primary) hypertension: Secondary | ICD-10-CM | POA: Diagnosis not present

## 2021-12-15 DIAGNOSIS — Z794 Long term (current) use of insulin: Secondary | ICD-10-CM | POA: Diagnosis not present

## 2021-12-15 DIAGNOSIS — N390 Urinary tract infection, site not specified: Secondary | ICD-10-CM | POA: Diagnosis not present

## 2021-12-15 DIAGNOSIS — Z0000 Encounter for general adult medical examination without abnormal findings: Secondary | ICD-10-CM | POA: Diagnosis not present

## 2021-12-15 DIAGNOSIS — I6930 Unspecified sequelae of cerebral infarction: Secondary | ICD-10-CM | POA: Diagnosis not present

## 2021-12-15 DIAGNOSIS — Z1231 Encounter for screening mammogram for malignant neoplasm of breast: Secondary | ICD-10-CM | POA: Diagnosis not present

## 2021-12-15 DIAGNOSIS — F3341 Major depressive disorder, recurrent, in partial remission: Secondary | ICD-10-CM | POA: Diagnosis not present

## 2021-12-15 DIAGNOSIS — I10 Essential (primary) hypertension: Secondary | ICD-10-CM | POA: Diagnosis not present

## 2021-12-15 DIAGNOSIS — B009 Herpesviral infection, unspecified: Secondary | ICD-10-CM | POA: Diagnosis not present

## 2021-12-15 DIAGNOSIS — F411 Generalized anxiety disorder: Secondary | ICD-10-CM | POA: Diagnosis not present

## 2021-12-15 DIAGNOSIS — E1140 Type 2 diabetes mellitus with diabetic neuropathy, unspecified: Secondary | ICD-10-CM | POA: Diagnosis not present

## 2021-12-15 DIAGNOSIS — M810 Age-related osteoporosis without current pathological fracture: Secondary | ICD-10-CM | POA: Diagnosis not present

## 2021-12-28 DIAGNOSIS — M810 Age-related osteoporosis without current pathological fracture: Secondary | ICD-10-CM | POA: Diagnosis not present

## 2021-12-29 ENCOUNTER — Telehealth: Payer: Self-pay

## 2021-12-29 NOTE — Telephone Encounter (Signed)
     Reason for call: ED-Follow up call    Patient visited [facility] on 12/02/2021  for Fainting   Telephone encounter attempt :  1st Attempt  A HIPAA compliant voice message was left requesting a return call.  Instructed patient to call back at [phone] at their earliest convenience.   [facility] management  [place], [place]  Main Phone: [phone]  E-mail: [email or web address]  Website: [email or web address]

## 2022-01-02 ENCOUNTER — Other Ambulatory Visit: Payer: Self-pay

## 2022-01-02 ENCOUNTER — Emergency Department (HOSPITAL_COMMUNITY): Payer: Medicare PPO

## 2022-01-02 ENCOUNTER — Emergency Department (HOSPITAL_COMMUNITY)
Admission: EM | Admit: 2022-01-02 | Discharge: 2022-01-02 | Disposition: A | Discharge status: Home / Self Care | Payer: Medicare PPO | Attending: Emergency Medicine | Admitting: Emergency Medicine

## 2022-01-02 DIAGNOSIS — W01198A Fall on same level from slipping, tripping and stumbling with subsequent striking against other object, initial encounter: Secondary | ICD-10-CM | POA: Insufficient documentation

## 2022-01-02 DIAGNOSIS — M4322 Fusion of spine, cervical region: Secondary | ICD-10-CM | POA: Diagnosis not present

## 2022-01-02 DIAGNOSIS — S0101XA Laceration without foreign body of scalp, initial encounter: Secondary | ICD-10-CM | POA: Insufficient documentation

## 2022-01-02 DIAGNOSIS — Z7901 Long term (current) use of anticoagulants: Secondary | ICD-10-CM | POA: Diagnosis not present

## 2022-01-02 DIAGNOSIS — S01121A Laceration with foreign body of right eyelid and periocular area, initial encounter: Secondary | ICD-10-CM | POA: Diagnosis not present

## 2022-01-02 DIAGNOSIS — E119 Type 2 diabetes mellitus without complications: Secondary | ICD-10-CM | POA: Diagnosis not present

## 2022-01-02 DIAGNOSIS — S0990XA Unspecified injury of head, initial encounter: Secondary | ICD-10-CM

## 2022-01-02 DIAGNOSIS — Z79899 Other long term (current) drug therapy: Secondary | ICD-10-CM | POA: Diagnosis not present

## 2022-01-02 DIAGNOSIS — W19XXXA Unspecified fall, initial encounter: Secondary | ICD-10-CM | POA: Diagnosis not present

## 2022-01-02 DIAGNOSIS — Z794 Long term (current) use of insulin: Secondary | ICD-10-CM | POA: Diagnosis not present

## 2022-01-02 DIAGNOSIS — I4891 Unspecified atrial fibrillation: Secondary | ICD-10-CM | POA: Insufficient documentation

## 2022-01-02 DIAGNOSIS — R609 Edema, unspecified: Secondary | ICD-10-CM | POA: Diagnosis not present

## 2022-01-02 DIAGNOSIS — I700 Atherosclerosis of aorta: Secondary | ICD-10-CM | POA: Insufficient documentation

## 2022-01-02 DIAGNOSIS — R9082 White matter disease, unspecified: Secondary | ICD-10-CM | POA: Insufficient documentation

## 2022-01-02 DIAGNOSIS — F0781 Postconcussional syndrome: Secondary | ICD-10-CM | POA: Diagnosis not present

## 2022-01-02 DIAGNOSIS — S32592A Other specified fracture of left pubis, initial encounter for closed fracture: Secondary | ICD-10-CM | POA: Diagnosis not present

## 2022-01-02 DIAGNOSIS — I2510 Atherosclerotic heart disease of native coronary artery without angina pectoris: Secondary | ICD-10-CM | POA: Insufficient documentation

## 2022-01-02 DIAGNOSIS — I6381 Other cerebral infarction due to occlusion or stenosis of small artery: Secondary | ICD-10-CM | POA: Diagnosis not present

## 2022-01-02 DIAGNOSIS — Z043 Encounter for examination and observation following other accident: Secondary | ICD-10-CM | POA: Diagnosis not present

## 2022-01-02 DIAGNOSIS — T68XXXA Hypothermia, initial encounter: Secondary | ICD-10-CM | POA: Diagnosis not present

## 2022-01-02 DIAGNOSIS — R0781 Pleurodynia: Secondary | ICD-10-CM | POA: Diagnosis not present

## 2022-01-02 DIAGNOSIS — I959 Hypotension, unspecified: Secondary | ICD-10-CM | POA: Diagnosis not present

## 2022-01-02 LAB — URINALYSIS, ROUTINE W REFLEX MICROSCOPIC
Bacteria, UA: NONE SEEN
Bilirubin Urine: NEGATIVE
Glucose, UA: >=500 mg/dL — AB
Hgb urine dipstick: NEGATIVE
Ketones, ur: NEGATIVE mg/dL
Leukocytes,Ua: NEGATIVE
Nitrite: NEGATIVE
Protein, ur: NEGATIVE mg/dL
Specific Gravity, Urine: 1.015 (ref 1.005–1.030)
pH: 6 (ref 5.0–8.0)

## 2022-01-02 LAB — CBC WITH DIFFERENTIAL/PLATELET
Abs Immature Granulocytes: 0.02 10*3/uL (ref 0.00–0.07)
Basophils Absolute: 0 10*3/uL (ref 0.0–0.1)
Basophils Relative: 1 %
Eosinophils Absolute: 0.1 10*3/uL (ref 0.0–0.5)
Eosinophils Relative: 1 %
HCT: 48.8 % — ABNORMAL HIGH (ref 36.0–46.0)
Hemoglobin: 16.2 g/dL — ABNORMAL HIGH (ref 12.0–15.0)
Immature Granulocytes: 0 %
Lymphocytes Relative: 29 %
Lymphs Abs: 1.8 10*3/uL (ref 0.7–4.0)
MCH: 31.2 pg (ref 26.0–34.0)
MCHC: 33.2 g/dL (ref 30.0–36.0)
MCV: 93.8 fL (ref 80.0–100.0)
Monocytes Absolute: 0.4 10*3/uL (ref 0.1–1.0)
Monocytes Relative: 7 %
Neutro Abs: 3.9 10*3/uL (ref 1.7–7.7)
Neutrophils Relative %: 62 %
Platelets: 207 10*3/uL (ref 150–400)
RBC: 5.2 MIL/uL — ABNORMAL HIGH (ref 3.87–5.11)
RDW: 12.8 % (ref 11.5–15.5)
WBC: 6.2 10*3/uL (ref 4.0–10.5)
nRBC: 0 % (ref 0.0–0.2)

## 2022-01-02 LAB — COMPREHENSIVE METABOLIC PANEL
ALT: 18 U/L (ref 0–44)
AST: 32 U/L (ref 15–41)
Albumin: 3.6 g/dL (ref 3.5–5.0)
Alkaline Phosphatase: 75 U/L (ref 38–126)
Anion gap: 11 (ref 5–15)
BUN: 18 mg/dL (ref 8–23)
CO2: 23 mmol/L (ref 22–32)
Calcium: 9.2 mg/dL (ref 8.9–10.3)
Chloride: 106 mmol/L (ref 98–111)
Creatinine, Ser: 0.78 mg/dL (ref 0.44–1.00)
GFR, Estimated: >60 mL/min (ref >60)
Glucose, Bld: 139 mg/dL — ABNORMAL HIGH (ref 70–99)
Potassium: 4.6 mmol/L (ref 3.5–5.1)
Sodium: 140 mmol/L (ref 135–145)
Total Bilirubin: 0.8 mg/dL (ref 0.3–1.2)
Total Protein: 6.5 g/dL (ref 6.5–8.1)

## 2022-01-02 LAB — PROTIME-INR
INR: 1.1 (ref 0.8–1.2)
Prothrombin Time: 14.1 seconds (ref 11.4–15.2)

## 2022-01-02 MED ORDER — SODIUM CHLORIDE 0.9 % IV BOLUS
1000.0000 mL | Freq: Once | INTRAVENOUS | Status: AC
  Administered 2022-01-02: 1000 mL via INTRAVENOUS

## 2022-01-02 NOTE — Progress Notes (Signed)
Orthopedic Tech Progress Note Patient Details:  Melanie Lucas 04-15-1950 [id number]  Level 2 trauma   Patient ID: Melanie Lucas, female   DOB: 1950/02/21, 71 y.o.   MRN: [id number]  Melanie Lucas 01/02/2022, 3:46 PM

## 2022-01-02 NOTE — ED Notes (Signed)
Per daughter, pt is baseline confused but speech is much worse than normal

## 2022-01-02 NOTE — ED Provider Notes (Signed)
[facility] EMERGENCY DEPARTMENT Provider Note   CSN: [id number] Arrival date & time: 01/02/22  1500     History  Chief Complaint  Patient presents with   fall on thinners- L2    Melanie Lucas is a 71 y.o. female history of diabetes, CAD, heart failure, A-fib on Eliquis, previous subdural hemorrhage, here presenting with fall.  Patient had a mechanical fall and hit her forehead.  Patient states that she did not pass out.  Patient was noted to have laceration of the right eyebrow.  Denies any other injuries.  Level 2 trauma activated by EMS since she is on blood thinners.  Patient has a previous subdural hemorrhage back in July of this year.  The history is provided by the patient.       Home Medications Prior to Admission medications   Medication Sig Start Date End Date Taking? Authorizing Provider  acetaminophen (TYLENOL) 325 MG tablet Take 2 tablets (650 mg total) by mouth every 6 (six) hours as needed for mild pain (or Fever >/= 101). 09/04/21   Elgergawy, Silver Huguenin, MD  albuterol (VENTOLIN HFA) 108 (90 Base) MCG/ACT inhaler Inhale 2 puffs into the lungs every 6 (six) hours as needed for wheezing or shortness of breath.    [provider]  dofetilide (TIKOSYN) 250 MCG capsule Take 1 capsule (250 mcg total) by mouth 2 (two) times daily. 08/09/21   Sherran Needs, NP  DULoxetine (CYMBALTA) 30 MG capsule Take 30 mg by mouth at bedtime. 08/02/21   [provider]  furosemide (LASIX) 20 MG tablet Take 20 mg by mouth daily.    [provider]  insulin aspart (NOVOLOG) 100 UNIT/ML injection Inject 0-15 Units into the skin 3 (three) times daily with meals. 09/04/21   Elgergawy, Silver Huguenin, MD  insulin aspart (NOVOLOG) 100 UNIT/ML injection Inject 0-5 Units into the skin at bedtime. 09/04/21   Elgergawy, Silver Huguenin, MD  insulin glargine-yfgn (SEMGLEE) 100 UNIT/ML injection Inject 0.08 mLs (8 Units total) into the skin daily. 09/05/21   Elgergawy, Silver Huguenin, MD  linaclotide (LINZESS) 290 MCG CAPS capsule Take 290 mcg by mouth daily before breakfast.    [provider]  losartan (COZAAR) 50 MG tablet TAKE 1 TABLET BY MOUTH EVERY DAY Patient taking differently: Take 50 mg by mouth daily. 03/20/21   Allred, Jeneen Rinks, MD  melatonin 5 MG TABS Take 1 tablet (5 mg total) by mouth at bedtime. 09/04/21   Elgergawy, Silver Huguenin, MD  mirabegron ER (MYRBETRIQ) 50 MG TB24 tablet Take 50 mg by mouth at bedtime.    [provider]  Multiple Vitamin (MULTIVITAMIN) tablet Take 1 tablet by mouth at bedtime.     [provider]  potassium chloride SA (KLOR-CON M) 20 MEQ tablet Take 1 tablet (20 mEq total) by mouth daily. 08/01/21   Dwyane Dee, MD  QUEtiapine (SEROQUEL) 25 MG tablet Take 1 tablet (25 mg total) by mouth at bedtime. 09/04/21   Elgergawy, Silver Huguenin, MD      Allergies    Alendronate sodium, Avelox [moxifloxacin], Fosamax [alendronate], Pradaxa [dabigatran etexilate mesylate], Statins, Sulfa antibiotics, Sulfamethoxazole, Sulfonamide derivatives, Varenicline, Ciprofloxacin, Gabapentin, and Tape    Review of Systems   Review of Systems  Skin:  Positive for wound.  All other systems reviewed and are negative.   Physical Exam Updated Vital Signs BP (S) 124/86   Pulse (S) (!) 104   Temp (S) 98.2 F (36.8 C) (Temporal)   Resp (S)  14   Ht '5\' 7"'$  (1.702 m)   Wt 65.3 kg   SpO2 (S) 98%   BMI 22.55 kg/m  Physical Exam Vitals and nursing note reviewed.  Constitutional:      Appearance: Normal appearance.  HENT:     Head: Normocephalic.     Comments: 1 cm laceration R eyebrow area.    Mouth/Throat:     Mouth: Mucous membranes are moist.  Eyes:     Extraocular Movements: Extraocular movements intact.     Pupils: Pupils are equal, round, and reactive to light.  Neck:     Comments: C collar in place  Cardiovascular:     Rate and Rhythm: Normal rate.     Pulses: Normal pulses.  Pulmonary:     Effort: Pulmonary effort is  normal.     Breath sounds: Normal breath sounds.  Abdominal:     General: Abdomen is flat.     Palpations: Abdomen is soft.  Musculoskeletal:        General: Normal range of motion.     Comments: No spinal tenderness and pelvis is stable.  No obvious extremity trauma.  Skin:    General: Skin is warm.  Neurological:     General: No focal deficit present.     Mental Status: She is alert and oriented to person, place, and time.  Psychiatric:        Mood and Affect: Mood normal.        Behavior: Behavior normal.     ED Results / Procedures / Treatments   Labs (all labs ordered are listed, but only abnormal results are displayed) Labs Reviewed  CBC WITH DIFFERENTIAL/PLATELET  COMPREHENSIVE METABOLIC PANEL  PROTIME-INR    EKG None  Radiology DG Pelvis Portable  Result Date: 01/02/2022 CLINICAL DATA:  Fall, anticoagulation EXAM: PORTABLE PELVIS 1-2 VIEWS COMPARISON:  CT pelvis 12/02/2021 FINDINGS: Moderate degenerative chondral thinning in both hips. Mild sclerosis in the left pubic body, left inferior pubic ramus, and in the sacral ala compatible with healing sacral insufficiency fracture and healing fractures of the left pubic rami. IMPRESSION: 1. Healing nondisplaced fractures of the sacrum, left inferior pubic ramus, and left pubic body. 2. Moderate degenerative chondral thinning in both hips. Electronically Signed   By: Van Clines M.D.   On: 01/02/2022 15:28   DG Chest Port 1 View  Result Date: 01/02/2022 CLINICAL DATA:  Fall.  Anti coagulation. EXAM: PORTABLE CHEST 1 VIEW COMPARISON:  12/02/2021 FINDINGS: Lower cervical plate and screw fixator. Atherosclerotic calcification of the aortic arch. Heart size within normal limits for AP projection. The lungs appear clear.  Bony demineralization noted. Dextroconvex upper thoracic scoliosis. IMPRESSION: 1. No active cardiopulmonary disease is radiographically apparent. 2. Dextroconvex upper thoracic scoliosis. 3. Bony  demineralization. Electronically Signed   By: Van Clines M.D.   On: 01/02/2022 15:24    Procedures Procedures    LACERATION REPAIR Performed by: Wandra Arthurs Authorized by: Wandra Arthurs Consent: Verbal consent obtained. Risks and benefits: risks, benefits and alternatives were discussed Consent given by: patient Patient identity confirmed: provided demographic data Prepped and Draped in normal sterile fashion Wound explored  Laceration Location: R eyebrow  Laceration Length: 1 cm  No Foreign Bodies seen or palpated  Anesthesia: local infiltration  Local anesthetic: none  Irrigation method: syringe Amount of cleaning: standard  Skin closure: dermabond   Patient tolerance: Patient tolerated the procedure well with no immediate complications.   Medications Ordered in ED Medications - No data  to display  ED Course/ Medical Decision Making/ A&P Clinical Course as of 01/02/22 1543  Tue Jan 02, 2022  1514 Temp(S): 98.2 F (36.8 C) [CG]    Clinical Course User Index [CG] Azucena Cecil, PA-C                           Medical Decision Making CHAREESE SERGENT is a 71 y.o. female here with fall.  Patient had a mechanical fall and hit her head.  Patient has a history of subdural hemorrhage.  Plan to get CT head and cervical spine and labs and chest and pelvis x-rays.  6:57 PM CT head showed no acute bleeding.  Patient does have prior screws and plates with some retropulsion but patient has no focal neurodeficits.  Patient is ambulatory.  Patient initially was slightly confused but then mental status has improved.  Daughter is now at bedside.  She is comfortable taking patient home.  I told her that she may have postconcussive syndrome and may be dizzy and slightly off her baseline.  If her symptoms gets worse she can return back to the ED for reassessment  Problems Addressed: Injury of head, initial encounter: acute illness or injury Laceration of scalp, initial  encounter: acute illness or injury  Amount and/or Complexity of Data Reviewed Labs: ordered. Decision-making details documented in ED Course. Radiology: ordered and independent interpretation performed. Decision-making details documented in ED Course. ECG/medicine tests: ordered.    Final Clinical Impression(s) / ED Diagnoses Final diagnoses:  None    Rx / DC Orders ED Discharge Orders     None         Drenda Freeze, MD 01/02/22 1859

## 2022-01-02 NOTE — Discharge Instructions (Addendum)
As we discussed, you did not have any bleeding in your brain right now.  You have multiple hardware in your cervical spine.  Please follow-up with your spine surgeon.  As we discussed, you may have postconcussive syndrome and you may be slightly more confused and may have some trouble speaking for 1 to 2 days  We applied glue to the laceration and the glue will fall off in about a week  See your doctor for follow-up  Return to ER if you have worse headache, dizziness, trouble speaking, another fall

## 2022-01-02 NOTE — ED Notes (Signed)
Called CT, they will call back when able to take pt

## 2022-01-02 NOTE — ED Notes (Signed)
Trauma Response Nurse Documentation   Melanie Lucas is a 71 y.o. female arriving to [facility] ED via EMS  On Eliquis (apixaban) daily. Trauma was activated as a Level 2 by Charge based on the following trauma criteria Melanie Lucas patients > 65 with head trauma on anti-coagulation (excluding ASA). Trauma team at the bedside on patient arrival.   Patient cleared for CT by Melanie Lucas. Pt transported to CT with trauma response nurse present to monitor. RN remained with the patient throughout their absence from the department for clinical observation.   GCS 15.  History   Past Medical History:  Diagnosis Date   Anxiety    Cancer ([place])    Skin- leg    Chronic acquired lymphedema    a. R>L;  b. 03/2012 Neg LE U/S for DVT. Legs since age 10   Complication of anesthesia    woke up during colonoscopy, Lithrostrixpy, Biospy   COPD (chronic obstructive pulmonary disease) ([place])    Coronary artery disease    Deaf, right    Diabetes mellitus    Type II   Diabetic neuropathy ([place])    Elevated TSH    a. 10/2012 - inst to f/u PCP.   Full dentures    Hematuria    a. while on pradaxa,  she reports that she has seen Melanie Lucas and had low risk cystoscopy   History of kidney stones    Hyperlipidemia    Hypertension    patient denies   Incontinence    prior to urinating   Lacunar infarction [facility])    a. 02/2009 non-acute Lacunar infarct of the right thalamus noted on MRI of brain.   Left carotid stenosis    Lymphedema of extremity    had this problem, since she was a teenager. especially seen in R LE   Meniere disease    Myocardial infarction [facility])    Obesity    Osteoarthritis    cervical & lumbar region, knees, hands cramp also    PAF with post-termination pauses    a. on dronedarone;  b. CHA2DS2VASc = 5 (HTN, DM, h/o lacunar infarct on MRI, Female) ->refused oral anticoagulation after h/o hematuria on pradaxa;  c. 02/2012 Echo: EF 55-60%, mildly dil LA. D. Recurrent PAF 10/2012 after only taking Multaq  1x/day - spont conv to NSR, placed back on BID Multaq/eliquis;  e. 10/2014 Multaq d/c'd->tikosyn initiated.   Shortness of breath dyspnea    with exertion   Stroke [facility])    2 mini strokes   Tobacco abuse    Vision abnormalities      Past Surgical History:  Procedure Laterality Date   ANTERIOR CERVICAL CORPECTOMY N/A 02/21/2016   Procedure: ANTERIOR CERVICAL CORPECTOMY ANS FUSION CERVICAL SIX , ANTERIOR PLATING CERVICAL FIVE-SEVEN;  Surgeon: Melanie Lose, MD;  Location: [facility];  Service: Neurosurgery;  Laterality: N/A;   ANTERIOR FUSION CERVICAL SPINE  02/21/2016   Biopsy of adrenal glands     BREAST BIOPSY Bilateral    benign results, both breasts    CARDIAC CATHETERIZATION  2014   x1 stent placed, done in [place]    CARDIOVERSION N/A 11/11/2014   Procedure: CARDIOVERSION;  Surgeon: Melanie Dresser, MD;  Location: [facility];  Service: Cardiovascular;  Laterality: N/A;   CAROTID ENDARTERECTOMY Left 07/29/2017   COLONOSCOPY W/ POLYPECTOMY     CORONARY STENT PLACEMENT     ENDARTERECTOMY Left 07/29/2017   Procedure: ENDARTERECTOMY CAROTID LEFT;  Surgeon: Melanie Posner, MD;  Location: [facility]  OR;  Service: Vascular;  Laterality: Left;   PATCH ANGIOPLASTY Left 07/29/2017   Procedure: PATCH ANGIOPLASTY USING HEMASHIELD GOLD VASCULAR PATCH;  Surgeon: Melanie Posner, MD;  Location: [facility] OR;  Service: Vascular;  Laterality: Left;   TEE WITHOUT CARDIOVERSION N/A 11/11/2014   Procedure: TRANSESOPHAGEAL ECHOCARDIOGRAM (TEE);  Surgeon: Melanie Dresser, MD;  Location: [facility];  Service: Cardiovascular;  Laterality: N/A;       Initial Focused Assessment (If applicable, or please see trauma documentation):       Interventions:    Consults completed:    Event Summary:  TO ED via [facility] from home-- witnessed fall in front of family-- on elaquis-- abrasion and laceration to right eyebrow. Bleeding controlled-- closed by Melanie Lucas on arrival.  Speech slurred-- hx of a stroke--   MTP    Bedside handoff with ED RN Melanie Galas, RN.    Melanie Lucas Melanie Lucas  Trauma Response RN  Please call TRN at [phone] for further assistance.

## 2022-01-03 LAB — URINE CULTURE: Culture: NO GROWTH

## 2022-01-05 ENCOUNTER — Emergency Department (HOSPITAL_COMMUNITY): Payer: Medicare PPO

## 2022-01-05 ENCOUNTER — Encounter (HOSPITAL_COMMUNITY): Payer: Self-pay

## 2022-01-05 ENCOUNTER — Other Ambulatory Visit: Payer: Self-pay

## 2022-01-05 ENCOUNTER — Inpatient Hospital Stay (HOSPITAL_COMMUNITY)
Admission: EM | Admit: 2022-01-05 | Discharge: 2022-01-17 | DRG: 102 | Disposition: A | Discharge status: Skilled Nursing Facility | Payer: Medicare PPO | Attending: Internal Medicine | Admitting: Internal Medicine

## 2022-01-05 DIAGNOSIS — Z803 Family history of malignant neoplasm of breast: Secondary | ICD-10-CM

## 2022-01-05 DIAGNOSIS — Z043 Encounter for examination and observation following other accident: Secondary | ICD-10-CM | POA: Diagnosis not present

## 2022-01-05 DIAGNOSIS — Z87440 Personal history of urinary (tract) infections: Secondary | ICD-10-CM

## 2022-01-05 DIAGNOSIS — E872 Acidosis, unspecified: Secondary | ICD-10-CM | POA: Diagnosis present

## 2022-01-05 DIAGNOSIS — N3281 Overactive bladder: Secondary | ICD-10-CM | POA: Diagnosis present

## 2022-01-05 DIAGNOSIS — I252 Old myocardial infarction: Secondary | ICD-10-CM

## 2022-01-05 DIAGNOSIS — Z981 Arthrodesis status: Secondary | ICD-10-CM

## 2022-01-05 DIAGNOSIS — R413 Other amnesia: Secondary | ICD-10-CM | POA: Diagnosis not present

## 2022-01-05 DIAGNOSIS — R296 Repeated falls: Secondary | ICD-10-CM | POA: Diagnosis not present

## 2022-01-05 DIAGNOSIS — Z681 Body mass index (BMI) 19 or less, adult: Secondary | ICD-10-CM

## 2022-01-05 DIAGNOSIS — I2510 Atherosclerotic heart disease of native coronary artery without angina pectoris: Secondary | ICD-10-CM | POA: Diagnosis present

## 2022-01-05 DIAGNOSIS — R55 Syncope and collapse: Secondary | ICD-10-CM | POA: Diagnosis not present

## 2022-01-05 DIAGNOSIS — E669 Obesity, unspecified: Secondary | ICD-10-CM | POA: Diagnosis present

## 2022-01-05 DIAGNOSIS — E861 Hypovolemia: Secondary | ICD-10-CM | POA: Diagnosis not present

## 2022-01-05 DIAGNOSIS — I472 Ventricular tachycardia, unspecified: Secondary | ICD-10-CM | POA: Diagnosis not present

## 2022-01-05 DIAGNOSIS — R824 Acetonuria: Secondary | ICD-10-CM | POA: Diagnosis present

## 2022-01-05 DIAGNOSIS — Z955 Presence of coronary angioplasty implant and graft: Secondary | ICD-10-CM

## 2022-01-05 DIAGNOSIS — G9340 Encephalopathy, unspecified: Secondary | ICD-10-CM | POA: Diagnosis present

## 2022-01-05 DIAGNOSIS — I10 Essential (primary) hypertension: Secondary | ICD-10-CM | POA: Diagnosis not present

## 2022-01-05 DIAGNOSIS — I272 Pulmonary hypertension, unspecified: Secondary | ICD-10-CM | POA: Diagnosis present

## 2022-01-05 DIAGNOSIS — R262 Difficulty in walking, not elsewhere classified: Secondary | ICD-10-CM

## 2022-01-05 DIAGNOSIS — S199XXA Unspecified injury of neck, initial encounter: Secondary | ICD-10-CM | POA: Diagnosis not present

## 2022-01-05 DIAGNOSIS — Z7983 Long term (current) use of bisphosphonates: Secondary | ICD-10-CM

## 2022-01-05 DIAGNOSIS — R292 Abnormal reflex: Secondary | ICD-10-CM | POA: Diagnosis present

## 2022-01-05 DIAGNOSIS — I6522 Occlusion and stenosis of left carotid artery: Secondary | ICD-10-CM | POA: Diagnosis present

## 2022-01-05 DIAGNOSIS — I6921 Attention and concentration deficit following other nontraumatic intracranial hemorrhage: Secondary | ICD-10-CM | POA: Diagnosis not present

## 2022-01-05 DIAGNOSIS — Z9181 History of falling: Secondary | ICD-10-CM

## 2022-01-05 DIAGNOSIS — F32A Depression, unspecified: Secondary | ICD-10-CM | POA: Diagnosis present

## 2022-01-05 DIAGNOSIS — I452 Bifascicular block: Secondary | ICD-10-CM | POA: Diagnosis present

## 2022-01-05 DIAGNOSIS — M47812 Spondylosis without myelopathy or radiculopathy, cervical region: Secondary | ICD-10-CM | POA: Diagnosis not present

## 2022-01-05 DIAGNOSIS — F17210 Nicotine dependence, cigarettes, uncomplicated: Secondary | ICD-10-CM | POA: Diagnosis present

## 2022-01-05 DIAGNOSIS — I5032 Chronic diastolic (congestive) heart failure: Secondary | ICD-10-CM | POA: Diagnosis present

## 2022-01-05 DIAGNOSIS — J449 Chronic obstructive pulmonary disease, unspecified: Secondary | ICD-10-CM | POA: Diagnosis present

## 2022-01-05 DIAGNOSIS — E8881 Metabolic syndrome: Secondary | ICD-10-CM | POA: Diagnosis present

## 2022-01-05 DIAGNOSIS — J811 Chronic pulmonary edema: Secondary | ICD-10-CM | POA: Diagnosis not present

## 2022-01-05 DIAGNOSIS — Z888 Allergy status to other drugs, medicaments and biological substances status: Secondary | ICD-10-CM

## 2022-01-05 DIAGNOSIS — Z806 Family history of leukemia: Secondary | ICD-10-CM

## 2022-01-05 DIAGNOSIS — G9341 Metabolic encephalopathy: Secondary | ICD-10-CM | POA: Diagnosis present

## 2022-01-05 DIAGNOSIS — I69292 Facial weakness following other nontraumatic intracranial hemorrhage: Secondary | ICD-10-CM | POA: Diagnosis not present

## 2022-01-05 DIAGNOSIS — I69351 Hemiplegia and hemiparesis following cerebral infarction affecting right dominant side: Secondary | ICD-10-CM | POA: Diagnosis not present

## 2022-01-05 DIAGNOSIS — E1142 Type 2 diabetes mellitus with diabetic polyneuropathy: Secondary | ICD-10-CM | POA: Diagnosis present

## 2022-01-05 DIAGNOSIS — N281 Cyst of kidney, acquired: Secondary | ICD-10-CM | POA: Diagnosis present

## 2022-01-05 DIAGNOSIS — Z8052 Family history of malignant neoplasm of bladder: Secondary | ICD-10-CM

## 2022-01-05 DIAGNOSIS — R404 Transient alteration of awareness: Secondary | ICD-10-CM | POA: Diagnosis not present

## 2022-01-05 DIAGNOSIS — Z8249 Family history of ischemic heart disease and other diseases of the circulatory system: Secondary | ICD-10-CM

## 2022-01-05 DIAGNOSIS — I482 Chronic atrial fibrillation, unspecified: Secondary | ICD-10-CM | POA: Diagnosis present

## 2022-01-05 DIAGNOSIS — I110 Hypertensive heart disease with heart failure: Secondary | ICD-10-CM | POA: Diagnosis present

## 2022-01-05 DIAGNOSIS — E1165 Type 2 diabetes mellitus with hyperglycemia: Secondary | ICD-10-CM | POA: Diagnosis present

## 2022-01-05 DIAGNOSIS — I69322 Dysarthria following cerebral infarction: Secondary | ICD-10-CM

## 2022-01-05 DIAGNOSIS — E559 Vitamin D deficiency, unspecified: Secondary | ICD-10-CM | POA: Diagnosis present

## 2022-01-05 DIAGNOSIS — H9191 Unspecified hearing loss, right ear: Secondary | ICD-10-CM | POA: Diagnosis present

## 2022-01-05 DIAGNOSIS — G319 Degenerative disease of nervous system, unspecified: Secondary | ICD-10-CM | POA: Diagnosis not present

## 2022-01-05 DIAGNOSIS — Z87442 Personal history of urinary calculi: Secondary | ICD-10-CM

## 2022-01-05 DIAGNOSIS — I69222 Dysarthria following other nontraumatic intracranial hemorrhage: Secondary | ICD-10-CM | POA: Diagnosis not present

## 2022-01-05 DIAGNOSIS — F0781 Postconcussional syndrome: Principal | ICD-10-CM | POA: Diagnosis present

## 2022-01-05 DIAGNOSIS — Z882 Allergy status to sulfonamides status: Secondary | ICD-10-CM

## 2022-01-05 DIAGNOSIS — I480 Paroxysmal atrial fibrillation: Secondary | ICD-10-CM | POA: Diagnosis present

## 2022-01-05 DIAGNOSIS — R1312 Dysphagia, oropharyngeal phase: Secondary | ICD-10-CM | POA: Diagnosis present

## 2022-01-05 DIAGNOSIS — R4182 Altered mental status, unspecified: Secondary | ICD-10-CM | POA: Diagnosis not present

## 2022-01-05 DIAGNOSIS — Z794 Long term (current) use of insulin: Secondary | ICD-10-CM

## 2022-01-05 DIAGNOSIS — R258 Other abnormal involuntary movements: Secondary | ICD-10-CM | POA: Diagnosis present

## 2022-01-05 DIAGNOSIS — Z1152 Encounter for screening for COVID-19: Secondary | ICD-10-CM | POA: Diagnosis not present

## 2022-01-05 DIAGNOSIS — K5790 Diverticulosis of intestine, part unspecified, without perforation or abscess without bleeding: Secondary | ICD-10-CM

## 2022-01-05 DIAGNOSIS — M419 Scoliosis, unspecified: Secondary | ICD-10-CM | POA: Diagnosis not present

## 2022-01-05 DIAGNOSIS — Z792 Long term (current) use of antibiotics: Secondary | ICD-10-CM

## 2022-01-05 DIAGNOSIS — N3000 Acute cystitis without hematuria: Secondary | ICD-10-CM | POA: Diagnosis present

## 2022-01-05 DIAGNOSIS — I69214 Frontal lobe and executive function deficit following other nontraumatic intracranial hemorrhage: Secondary | ICD-10-CM | POA: Diagnosis not present

## 2022-01-05 DIAGNOSIS — I69320 Aphasia following cerebral infarction: Secondary | ICD-10-CM

## 2022-01-05 DIAGNOSIS — E785 Hyperlipidemia, unspecified: Secondary | ICD-10-CM | POA: Diagnosis present

## 2022-01-05 DIAGNOSIS — E440 Moderate protein-calorie malnutrition: Secondary | ICD-10-CM | POA: Diagnosis not present

## 2022-01-05 DIAGNOSIS — Z85828 Personal history of other malignant neoplasm of skin: Secondary | ICD-10-CM

## 2022-01-05 DIAGNOSIS — M4322 Fusion of spine, cervical region: Secondary | ICD-10-CM | POA: Diagnosis not present

## 2022-01-05 DIAGNOSIS — Z79899 Other long term (current) drug therapy: Secondary | ICD-10-CM

## 2022-01-05 DIAGNOSIS — Z7401 Bed confinement status: Secondary | ICD-10-CM | POA: Diagnosis not present

## 2022-01-05 DIAGNOSIS — E119 Type 2 diabetes mellitus without complications: Secondary | ICD-10-CM

## 2022-01-05 DIAGNOSIS — R531 Weakness: Secondary | ICD-10-CM | POA: Diagnosis not present

## 2022-01-05 DIAGNOSIS — R7989 Other specified abnormal findings of blood chemistry: Secondary | ICD-10-CM | POA: Diagnosis not present

## 2022-01-05 DIAGNOSIS — I69220 Aphasia following other nontraumatic intracranial hemorrhage: Secondary | ICD-10-CM | POA: Diagnosis not present

## 2022-01-05 DIAGNOSIS — N3090 Cystitis, unspecified without hematuria: Secondary | ICD-10-CM | POA: Diagnosis present

## 2022-01-05 DIAGNOSIS — Z7984 Long term (current) use of oral hypoglycemic drugs: Secondary | ICD-10-CM

## 2022-01-05 DIAGNOSIS — Z7901 Long term (current) use of anticoagulants: Secondary | ICD-10-CM

## 2022-01-05 DIAGNOSIS — R29818 Other symptoms and signs involving the nervous system: Secondary | ICD-10-CM | POA: Diagnosis not present

## 2022-01-05 DIAGNOSIS — I6523 Occlusion and stenosis of bilateral carotid arteries: Secondary | ICD-10-CM | POA: Diagnosis not present

## 2022-01-05 DIAGNOSIS — F419 Anxiety disorder, unspecified: Secondary | ICD-10-CM | POA: Diagnosis present

## 2022-01-05 DIAGNOSIS — I4891 Unspecified atrial fibrillation: Secondary | ICD-10-CM | POA: Diagnosis present

## 2022-01-05 DIAGNOSIS — R109 Unspecified abdominal pain: Secondary | ICD-10-CM | POA: Diagnosis not present

## 2022-01-05 DIAGNOSIS — R81 Glycosuria: Secondary | ICD-10-CM | POA: Diagnosis present

## 2022-01-05 DIAGNOSIS — I69218 Other symptoms and signs involving cognitive functions following other nontraumatic intracranial hemorrhage: Secondary | ICD-10-CM | POA: Diagnosis not present

## 2022-01-05 DIAGNOSIS — M810 Age-related osteoporosis without current pathological fracture: Secondary | ICD-10-CM | POA: Diagnosis present

## 2022-01-05 DIAGNOSIS — G4700 Insomnia, unspecified: Secondary | ICD-10-CM | POA: Diagnosis present

## 2022-01-05 LAB — CBC WITH DIFFERENTIAL/PLATELET
Abs Immature Granulocytes: 0.02 10*3/uL (ref 0.00–0.07)
Basophils Absolute: 0 10*3/uL (ref 0.0–0.1)
Basophils Relative: 1 %
Eosinophils Absolute: 0.1 10*3/uL (ref 0.0–0.5)
Eosinophils Relative: 1 %
HCT: 46.2 % — ABNORMAL HIGH (ref 36.0–46.0)
Hemoglobin: 15.5 g/dL — ABNORMAL HIGH (ref 12.0–15.0)
Immature Granulocytes: 0 %
Lymphocytes Relative: 25 %
Lymphs Abs: 1.5 10*3/uL (ref 0.7–4.0)
MCH: 31.1 pg (ref 26.0–34.0)
MCHC: 33.5 g/dL (ref 30.0–36.0)
MCV: 92.6 fL (ref 80.0–100.0)
Monocytes Absolute: 0.5 10*3/uL (ref 0.1–1.0)
Monocytes Relative: 8 %
Neutro Abs: 4 10*3/uL (ref 1.7–7.7)
Neutrophils Relative %: 65 %
Platelets: 237 10*3/uL (ref 150–400)
RBC: 4.99 MIL/uL (ref 3.87–5.11)
RDW: 13 % (ref 11.5–15.5)
WBC: 6.1 10*3/uL (ref 4.0–10.5)
nRBC: 0 % (ref 0.0–0.2)

## 2022-01-05 LAB — COMPREHENSIVE METABOLIC PANEL
ALT: 17 U/L (ref 0–44)
AST: 18 U/L (ref 15–41)
Albumin: 3.8 g/dL (ref 3.5–5.0)
Alkaline Phosphatase: 70 U/L (ref 38–126)
Anion gap: 9 (ref 5–15)
BUN: 18 mg/dL (ref 8–23)
CO2: 24 mmol/L (ref 22–32)
Calcium: 9.1 mg/dL (ref 8.9–10.3)
Chloride: 109 mmol/L (ref 98–111)
Creatinine, Ser: 0.62 mg/dL (ref 0.44–1.00)
GFR, Estimated: >60 mL/min (ref >60)
Glucose, Bld: 145 mg/dL — ABNORMAL HIGH (ref 70–99)
Potassium: 3.5 mmol/L (ref 3.5–5.1)
Sodium: 142 mmol/L (ref 135–145)
Total Bilirubin: 1 mg/dL (ref 0.3–1.2)
Total Protein: 6.6 g/dL (ref 6.5–8.1)

## 2022-01-05 LAB — URINALYSIS, ROUTINE W REFLEX MICROSCOPIC
Bacteria, UA: NONE SEEN
Bilirubin Urine: NEGATIVE
Glucose, UA: >=500 mg/dL — AB
Hgb urine dipstick: NEGATIVE
Ketones, ur: 20 mg/dL — AB
Nitrite: NEGATIVE
Protein, ur: NEGATIVE mg/dL
Specific Gravity, Urine: 1.033 — ABNORMAL HIGH (ref 1.005–1.030)
WBC, UA: >50 WBC/hpf — ABNORMAL HIGH (ref 0–5)
pH: 5 (ref 5.0–8.0)

## 2022-01-05 LAB — BLOOD GAS, VENOUS
Acid-Base Excess: 1.3 mmol/L (ref 0.0–2.0)
Bicarbonate: 25.9 mmol/L (ref 20.0–28.0)
O2 Saturation: 89.8 %
Patient temperature: 37
pCO2, Ven: 40 mmHg — ABNORMAL LOW (ref 44–60)
pH, Ven: 7.42 (ref 7.25–7.43)
pO2, Ven: 54 mmHg — ABNORMAL HIGH (ref 32–45)

## 2022-01-05 LAB — AMMONIA: Ammonia: 17 umol/L (ref 9–35)

## 2022-01-05 LAB — RESP PANEL BY RT-PCR (FLU A&B, COVID) ARPGX2
Influenza A by PCR: NEGATIVE
Influenza B by PCR: NEGATIVE
SARS Coronavirus 2 by RT PCR: NEGATIVE

## 2022-01-05 LAB — TROPONIN I (HIGH SENSITIVITY)
Troponin I (High Sensitivity): 23 ng/L — ABNORMAL HIGH (ref <18)
Troponin I (High Sensitivity): 22 ng/L — ABNORMAL HIGH (ref <18)

## 2022-01-05 LAB — MAGNESIUM: Magnesium: 2.1 mg/dL (ref 1.7–2.4)

## 2022-01-05 MED ORDER — IOHEXOL 300 MG/ML  SOLN
100.0000 mL | Freq: Once | INTRAMUSCULAR | Status: AC | PRN
  Administered 2022-01-05: 100 mL via INTRAVENOUS

## 2022-01-05 MED ORDER — IOHEXOL 350 MG/ML SOLN
75.0000 mL | Freq: Once | INTRAVENOUS | Status: AC | PRN
  Administered 2022-01-05: 75 mL via INTRAVENOUS

## 2022-01-05 MED ORDER — SODIUM CHLORIDE 0.9 % IV BOLUS
1000.0000 mL | Freq: Once | INTRAVENOUS | Status: AC
  Administered 2022-01-05: 1000 mL via INTRAVENOUS

## 2022-01-05 MED ORDER — SODIUM CHLORIDE 0.9 % IV SOLN
1.0000 g | Freq: Once | INTRAVENOUS | Status: AC
  Administered 2022-01-05: 1 g via INTRAVENOUS
  Filled 2022-01-05: qty 10

## 2022-01-05 NOTE — ED Triage Notes (Signed)
Patient had a fall 3 days ago, head scans were negative for a bleed after falling on her face. They said they think she has a concussion. Patients responses are minimal, decreased motility, speaking in spanish, repetitive movements. Patient is on Eloquis.

## 2022-01-05 NOTE — ED Provider Notes (Signed)
[facility] DEPT Provider Note   CSN: [id number] Arrival date & time: 01/05/22  1834     History {Add pertinent medical, surgical, social history, OB history to HPI:1} Chief Complaint  Patient presents with   Altered Mental Status    Melanie Lucas is a 71 y.o. female.  Patient as above with significant medical history as below, including A-fib on Eliquis, HLD, HTN, prior subdural, cva w/ right sided residual on who presents to the ED with complaint of altered mental status.  History was provided primarily by the daughter at the bedside, patient had a fall 11/14, she was discharged in stable condition, following the fall patient has been having difficulty speech, difficulty following commands or walking, difficulty tolerating p.o. intake.  She has history of prior cva w/ right deficit but symptoms have worsened in last 3 days.  Patient unable to get up out of her chair this morning, symptoms gradually worsening the past 3 days.  Daughter also reports patient has been speaking Spanish for the past day and a half, normally does not speak [place].  Last took her medications this morning.  She has had 3 falls in the past 24 hours from her bed from the bedside commode with falling on her bottom.  Unsure if she had head injury.  Patient also complained that her "heart is hurting" but unable to specify any further what she means by this.  Last known normal was around 3 days ago     Past Medical History:  Diagnosis Date   Anxiety    Cancer ([place])    Skin- leg    Chronic acquired lymphedema    a. R>L;  b. 03/2012 Neg LE U/S for DVT. Legs since age 89   Complication of anesthesia    woke up during colonoscopy, Lithrostrixpy, Biospy   COPD (chronic obstructive pulmonary disease) ([place])    Coronary artery disease    Deaf, right    Diabetes mellitus    Type II   Diabetic neuropathy ([place])    Elevated TSH    a. 10/2012 - inst to f/u PCP.   Full dentures    Hematuria     a. while on pradaxa,  she reports that she has seen Dr Margie Ege and had low risk cystoscopy   History of kidney stones    Hyperlipidemia    Hypertension    patient denies   Incontinence    prior to urinating   Lacunar infarction [facility])    a. 02/2009 non-acute Lacunar infarct of the right thalamus noted on MRI of brain.   Left carotid stenosis    Lymphedema of extremity    had this problem, since she was a teenager. especially seen in R LE   Meniere disease    Myocardial infarction [facility])    Obesity    Osteoarthritis    cervical & lumbar region, knees, hands cramp also    PAF with post-termination pauses    a. on dronedarone;  b. CHA2DS2VASc = 5 (HTN, DM, h/o lacunar infarct on MRI, Female) ->refused oral anticoagulation after h/o hematuria on pradaxa;  c. 02/2012 Echo: EF 55-60%, mildly dil LA. D. Recurrent PAF 10/2012 after only taking Multaq 1x/day - spont conv to NSR, placed back on BID Multaq/eliquis;  e. 10/2014 Multaq d/c'd->tikosyn initiated.   Shortness of breath dyspnea    with exertion   Stroke [facility])    2 mini strokes   Tobacco abuse    Vision abnormalities  Past Surgical History:  Procedure Laterality Date   ANTERIOR CERVICAL CORPECTOMY N/A 02/21/2016   Procedure: ANTERIOR CERVICAL CORPECTOMY ANS FUSION CERVICAL SIX , ANTERIOR PLATING CERVICAL FIVE-SEVEN;  Surgeon: Consuella Lose, MD;  Location: [place];  Service: Neurosurgery;  Laterality: N/A;   ANTERIOR FUSION CERVICAL SPINE  02/21/2016   Biopsy of adrenal glands     BREAST BIOPSY Bilateral    benign results, both breasts    CARDIAC CATHETERIZATION  2014   x1 stent placed, done in [place]    CARDIOVERSION N/A 11/11/2014   Procedure: CARDIOVERSION;  Surgeon: Larey Dresser, MD;  Location: [place];  Service: Cardiovascular;  Laterality: N/A;   CAROTID ENDARTERECTOMY Left 07/29/2017   COLONOSCOPY W/ POLYPECTOMY     CORONARY STENT PLACEMENT     ENDARTERECTOMY Left 07/29/2017   Procedure: ENDARTERECTOMY CAROTID  LEFT;  Surgeon: Rosetta Posner, MD;  Location: [place];  Service: Vascular;  Laterality: Left;   PATCH ANGIOPLASTY Left 07/29/2017   Procedure: PATCH ANGIOPLASTY USING HEMASHIELD GOLD VASCULAR PATCH;  Surgeon: Rosetta Posner, MD;  Location: [place];  Service: Vascular;  Laterality: Left;   TEE WITHOUT CARDIOVERSION N/A 11/11/2014   Procedure: TRANSESOPHAGEAL ECHOCARDIOGRAM (TEE);  Surgeon: Larey Dresser, MD;  Location: [facility] ENDOSCOPY;  Service: Cardiovascular;  Laterality: N/A;     The history is provided by the patient and a relative. The history is limited by the condition of the patient. No language interpreter was used.  Altered Mental Status Associated symptoms: weakness        Home Medications Prior to Admission medications   Medication Sig Start Date End Date Taking? Authorizing Provider  acetaminophen (TYLENOL) 325 MG tablet Take 2 tablets (650 mg total) by mouth every 6 (six) hours as needed for mild pain (or Fever >/= 101). 09/04/21   Elgergawy, Silver Huguenin, MD  albuterol (VENTOLIN HFA) 108 (90 Base) MCG/ACT inhaler Inhale 2 puffs into the lungs every 6 (six) hours as needed for wheezing or shortness of breath.    [provider]  dofetilide (TIKOSYN) 250 MCG capsule Take 1 capsule (250 mcg total) by mouth 2 (two) times daily. 08/09/21   Sherran Needs, NP  DULoxetine (CYMBALTA) 30 MG capsule Take 30 mg by mouth at bedtime. 08/02/21   [provider]  furosemide (LASIX) 20 MG tablet Take 20 mg by mouth daily.    [provider]  insulin aspart (NOVOLOG) 100 UNIT/ML injection Inject 0-15 Units into the skin 3 (three) times daily with meals. 09/04/21   Elgergawy, Silver Huguenin, MD  insulin aspart (NOVOLOG) 100 UNIT/ML injection Inject 0-5 Units into the skin at bedtime. 09/04/21   Elgergawy, Silver Huguenin, MD  insulin glargine-yfgn (SEMGLEE) 100 UNIT/ML injection Inject 0.08 mLs (8 Units total) into the skin daily. 09/05/21   Elgergawy, Silver Huguenin, MD  linaclotide (LINZESS) 290 MCG  CAPS capsule Take 290 mcg by mouth daily before breakfast.    [provider]  losartan (COZAAR) 50 MG tablet TAKE 1 TABLET BY MOUTH EVERY DAY Patient taking differently: Take 50 mg by mouth daily. 03/20/21   Allred, Jeneen Rinks, MD  melatonin 5 MG TABS Take 1 tablet (5 mg total) by mouth at bedtime. 09/04/21   Elgergawy, Silver Huguenin, MD  mirabegron ER (MYRBETRIQ) 50 MG TB24 tablet Take 50 mg by mouth at bedtime.    [provider]  Multiple Vitamin (MULTIVITAMIN) tablet Take 1 tablet by mouth at bedtime.     [provider]  potassium chloride SA (  KLOR-CON M) 20 MEQ tablet Take 1 tablet (20 mEq total) by mouth daily. 08/01/21   Dwyane Dee, MD  QUEtiapine (SEROQUEL) 25 MG tablet Take 1 tablet (25 mg total) by mouth at bedtime. 09/04/21   Elgergawy, Silver Huguenin, MD      Allergies    Alendronate sodium, Avelox [moxifloxacin], Fosamax [alendronate], Pradaxa [dabigatran etexilate mesylate], Statins, Sulfa antibiotics, Sulfamethoxazole, Sulfonamide derivatives, Varenicline, Ciprofloxacin, Gabapentin, and Tape    Review of Systems   Review of Systems  Unable to perform ROS: Mental status change  Cardiovascular:  Positive for chest pain.  Neurological:  Positive for weakness.    Physical Exam Updated Vital Signs BP (!) 159/92 (BP Location: Right Arm)   Pulse 71   Temp 98.8 F (37.1 C) (Oral)   Resp 16   SpO2 99%  Physical Exam Vitals and nursing note reviewed. Exam conducted with a chaperone present.  Constitutional:      General: She is not in acute distress.    Appearance: Normal appearance. She is not ill-appearing.  HENT:     Head: Normocephalic.     Comments: Hematoma right orbit    Right Ear: External ear normal.     Left Ear: External ear normal.     Nose: Nose normal.     Mouth/Throat:     Mouth: Mucous membranes are moist.  Eyes:     General:        Right eye: No discharge.        Left eye: No discharge.     Pupils: Pupils are equal, round, and reactive  to light.  Cardiovascular:     Rate and Rhythm: Normal rate. Rhythm irregular.     Pulses: Normal pulses.     Heart sounds: Normal heart sounds.  Pulmonary:     Effort: Pulmonary effort is normal. No respiratory distress.     Breath sounds: Normal breath sounds. No wheezing or rales.  Abdominal:     General: Abdomen is flat.     Palpations: Abdomen is soft.     Tenderness: There is no abdominal tenderness.     Hernia: No hernia is present.  Musculoskeletal:        General: Normal range of motion.     Cervical back: Normal range of motion. No rigidity.     Right lower leg: Edema (pedal) present.     Left lower leg: Edema (pedal) present.  Skin:    General: Skin is warm and dry.     Capillary Refill: Capillary refill takes less than 2 seconds.     Coloration: Skin is not jaundiced.     Findings: Bruising present.  Neurological:     Mental Status: She is alert. She is disoriented.     GCS: GCS eye subscore is 4. GCS verbal subscore is 3. GCS motor subscore is 6.     Cranial Nerves: Facial asymmetry present.     Sensory: Sensation is intact. No sensory deficit.     Motor: No weakness.     Comments: Intermittent responses to yes/no questions  Does report equal sensation in all 4 extremities, sensation appears intact but difficult to discern based on patient's limited responsiveness  Facial droop noted  Unable to complete finger-nose testing, patient will touch her finger to her nose but would not extend her arm to touch my finger       ED Results / Procedures / Treatments   Labs (all labs ordered are listed, but only abnormal results are displayed)  Labs Reviewed  RESP PANEL BY RT-PCR (FLU A&B, COVID) ARPGX2  CBC WITH DIFFERENTIAL/PLATELET  COMPREHENSIVE METABOLIC PANEL  MAGNESIUM  URINALYSIS, ROUTINE W REFLEX MICROSCOPIC  AMMONIA  TROPONIN I (HIGH SENSITIVITY)    EKG None  Radiology No results found.  Procedures Procedures  {Document cardiac monitor, telemetry  assessment procedure when appropriate:1}  Medications Ordered in ED Medications  sodium chloride 0.9 % bolus 1,000 mL (has no administration in time range)    ED Course/ Medical Decision Making/ A&P                           Medical Decision Making Amount and/or Complexity of Data Reviewed Labs: ordered. Radiology: ordered.   This patient presents to the ED with chief complaint(s) of AMS with pertinent past medical history of prior CVA, subdural, anticoagulated, frequent falls and A-fib which further complicates the presenting complaint. The complaint involves an extensive differential diagnosis and also carries with it a high risk of complications and morbidity.    The differential diagnosis includes but not limited to Differential diagnoses for altered mental status includes but is not exclusive to alcohol, illicit or prescription medications, intracranial pathology such as stroke, intracerebral hemorrhage, fever or infectious causes including sepsis, hypoxemia, uremia, trauma, endocrine related disorders such as diabetes, hypoglycemia, thyroid-related diseases, etc.  Differential includes all life-threatening causes for chest pain. This includes but is not exclusive to acute coronary syndrome, aortic dissection, pulmonary embolism, cardiac tamponade, community-acquired pneumonia, pericarditis, musculoskeletal chest wall pain, etc.   . Serious etiologies were considered.   The initial plan is to screen labs and imaging, get head CT, chest x-ray.  Last known normal was approximately 3 days ago.  No stroke alert or LVO activation   Additional history obtained: Additional history obtained from family Records reviewed  prior ED visits, prior labs and imaging, Home medications  Independent labs interpretation:  The following labs were independently interpreted: ***  Independent visualization of imaging: - I independently visualized the following imaging with scope of interpretation  limited to determining acute life threatening conditions related to emergency care: ***, which revealed ***  Cardiac monitoring was reviewed and interpreted by myself which shows ***  Treatment and Reassessment: ***  Consultation: - Consulted or discussed management/test interpretation w/ external professional: ***  Consideration for admission or further workup: Admission was considered ***  Social Determinants of health: Social History   Tobacco Use   Smoking status: Every Day    Packs/day: 1.00    Years: 55.00    Total pack years: 55.00    Types: Cigarettes   Smokeless tobacco: Never   Tobacco comments:    1 pack daily 08/15/2020  Vaping Use   Vaping Use: Never used  Substance Use Topics   Alcohol use: Yes    Alcohol/week: 1.0 standard drink of alcohol    Types: 1 Glasses of wine per week   Drug use: No      {Document critical care time when appropriate:1} {Document review of labs and clinical decision tools ie heart score, Chads2Vasc2 etc:1}  {Document your independent review of radiology images, and any outside records:1} {Document your discussion with family members, caretakers, and with consultants:1} {Document social determinants of health affecting pt's care:1} {Document your decision making why or why not admission, treatments were needed:1} Final Clinical Impression(s) / ED Diagnoses Final diagnoses:  None    Rx / DC Orders ED Discharge Orders     None

## 2022-01-06 ENCOUNTER — Emergency Department (HOSPITAL_COMMUNITY): Payer: Medicare PPO

## 2022-01-06 DIAGNOSIS — I5032 Chronic diastolic (congestive) heart failure: Secondary | ICD-10-CM | POA: Diagnosis present

## 2022-01-06 DIAGNOSIS — E872 Acidosis, unspecified: Secondary | ICD-10-CM | POA: Diagnosis present

## 2022-01-06 DIAGNOSIS — I452 Bifascicular block: Secondary | ICD-10-CM | POA: Diagnosis present

## 2022-01-06 DIAGNOSIS — F32A Depression, unspecified: Secondary | ICD-10-CM | POA: Diagnosis present

## 2022-01-06 DIAGNOSIS — I272 Pulmonary hypertension, unspecified: Secondary | ICD-10-CM | POA: Diagnosis present

## 2022-01-06 DIAGNOSIS — I69351 Hemiplegia and hemiparesis following cerebral infarction affecting right dominant side: Secondary | ICD-10-CM | POA: Diagnosis not present

## 2022-01-06 DIAGNOSIS — Z794 Long term (current) use of insulin: Secondary | ICD-10-CM | POA: Diagnosis not present

## 2022-01-06 DIAGNOSIS — J449 Chronic obstructive pulmonary disease, unspecified: Secondary | ICD-10-CM | POA: Diagnosis present

## 2022-01-06 DIAGNOSIS — Z1152 Encounter for screening for COVID-19: Secondary | ICD-10-CM | POA: Diagnosis not present

## 2022-01-06 DIAGNOSIS — R292 Abnormal reflex: Secondary | ICD-10-CM | POA: Diagnosis present

## 2022-01-06 DIAGNOSIS — F0781 Postconcussional syndrome: Secondary | ICD-10-CM | POA: Diagnosis present

## 2022-01-06 DIAGNOSIS — I480 Paroxysmal atrial fibrillation: Secondary | ICD-10-CM | POA: Diagnosis present

## 2022-01-06 DIAGNOSIS — G9340 Encephalopathy, unspecified: Secondary | ICD-10-CM | POA: Diagnosis present

## 2022-01-06 DIAGNOSIS — G9341 Metabolic encephalopathy: Secondary | ICD-10-CM | POA: Diagnosis present

## 2022-01-06 DIAGNOSIS — I482 Chronic atrial fibrillation, unspecified: Secondary | ICD-10-CM | POA: Diagnosis present

## 2022-01-06 DIAGNOSIS — I472 Ventricular tachycardia, unspecified: Secondary | ICD-10-CM | POA: Diagnosis not present

## 2022-01-06 DIAGNOSIS — I110 Hypertensive heart disease with heart failure: Secondary | ICD-10-CM | POA: Diagnosis present

## 2022-01-06 DIAGNOSIS — E669 Obesity, unspecified: Secondary | ICD-10-CM | POA: Diagnosis present

## 2022-01-06 DIAGNOSIS — N3090 Cystitis, unspecified without hematuria: Secondary | ICD-10-CM | POA: Diagnosis present

## 2022-01-06 DIAGNOSIS — E1142 Type 2 diabetes mellitus with diabetic polyneuropathy: Secondary | ICD-10-CM | POA: Diagnosis present

## 2022-01-06 DIAGNOSIS — I10 Essential (primary) hypertension: Secondary | ICD-10-CM | POA: Diagnosis not present

## 2022-01-06 DIAGNOSIS — N3000 Acute cystitis without hematuria: Secondary | ICD-10-CM | POA: Diagnosis present

## 2022-01-06 DIAGNOSIS — R4182 Altered mental status, unspecified: Secondary | ICD-10-CM | POA: Diagnosis not present

## 2022-01-06 DIAGNOSIS — I6522 Occlusion and stenosis of left carotid artery: Secondary | ICD-10-CM | POA: Diagnosis present

## 2022-01-06 DIAGNOSIS — Z681 Body mass index (BMI) 19 or less, adult: Secondary | ICD-10-CM | POA: Diagnosis not present

## 2022-01-06 DIAGNOSIS — E861 Hypovolemia: Secondary | ICD-10-CM | POA: Diagnosis not present

## 2022-01-06 DIAGNOSIS — E1165 Type 2 diabetes mellitus with hyperglycemia: Secondary | ICD-10-CM | POA: Diagnosis present

## 2022-01-06 LAB — CBC WITH DIFFERENTIAL/PLATELET
Abs Immature Granulocytes: 0.08 10*3/uL — ABNORMAL HIGH (ref 0.00–0.07)
Basophils Absolute: 0.1 10*3/uL (ref 0.0–0.1)
Basophils Relative: 1 %
Eosinophils Absolute: 0.1 10*3/uL (ref 0.0–0.5)
Eosinophils Relative: 1 %
HCT: 43.7 % (ref 36.0–46.0)
Hemoglobin: 14.8 g/dL (ref 12.0–15.0)
Immature Granulocytes: 1 %
Lymphocytes Relative: 22 %
Lymphs Abs: 1.5 10*3/uL (ref 0.7–4.0)
MCH: 31.4 pg (ref 26.0–34.0)
MCHC: 33.9 g/dL (ref 30.0–36.0)
MCV: 92.8 fL (ref 80.0–100.0)
Monocytes Absolute: 0.5 10*3/uL (ref 0.1–1.0)
Monocytes Relative: 7 %
Neutro Abs: 4.8 10*3/uL (ref 1.7–7.7)
Neutrophils Relative %: 68 %
Platelets: 224 10*3/uL (ref 150–400)
RBC: 4.71 MIL/uL (ref 3.87–5.11)
RDW: 12.9 % (ref 11.5–15.5)
WBC: 6.9 10*3/uL (ref 4.0–10.5)
nRBC: 0 % (ref 0.0–0.2)

## 2022-01-06 LAB — GLUCOSE, CAPILLARY
Glucose-Capillary: 132 mg/dL — ABNORMAL HIGH (ref 70–99)
Glucose-Capillary: 147 mg/dL — ABNORMAL HIGH (ref 70–99)
Glucose-Capillary: 164 mg/dL — ABNORMAL HIGH (ref 70–99)

## 2022-01-06 LAB — COMPREHENSIVE METABOLIC PANEL
ALT: 15 U/L (ref 0–44)
AST: 17 U/L (ref 15–41)
Albumin: 3.4 g/dL — ABNORMAL LOW (ref 3.5–5.0)
Alkaline Phosphatase: 68 U/L (ref 38–126)
Anion gap: 10 (ref 5–15)
BUN: 15 mg/dL (ref 8–23)
CO2: 21 mmol/L — ABNORMAL LOW (ref 22–32)
Calcium: 8.1 mg/dL — ABNORMAL LOW (ref 8.9–10.3)
Chloride: 109 mmol/L (ref 98–111)
Creatinine, Ser: 0.44 mg/dL (ref 0.44–1.00)
GFR, Estimated: >60 mL/min (ref >60)
Glucose, Bld: 119 mg/dL — ABNORMAL HIGH (ref 70–99)
Potassium: 3.5 mmol/L (ref 3.5–5.1)
Sodium: 140 mmol/L (ref 135–145)
Total Bilirubin: 1.1 mg/dL (ref 0.3–1.2)
Total Protein: 6.3 g/dL — ABNORMAL LOW (ref 6.5–8.1)

## 2022-01-06 LAB — MAGNESIUM: Magnesium: 2.1 mg/dL (ref 1.7–2.4)

## 2022-01-06 MED ORDER — SODIUM CHLORIDE 0.9 % IV SOLN
1.0000 g | INTRAVENOUS | Status: DC
  Administered 2022-01-06 – 2022-01-07 (×2): 1 g via INTRAVENOUS
  Filled 2022-01-06 (×2): qty 10

## 2022-01-06 MED ORDER — ACETAMINOPHEN 325 MG PO TABS
650.0000 mg | ORAL_TABLET | Freq: Four times a day (QID) | ORAL | Status: DC | PRN
  Administered 2022-01-06 – 2022-01-15 (×3): 650 mg via ORAL
  Filled 2022-01-06 (×4): qty 2

## 2022-01-06 MED ORDER — INSULIN GLARGINE-YFGN 100 UNIT/ML ~~LOC~~ SOLN
8.0000 [IU] | Freq: Every day | SUBCUTANEOUS | Status: DC
  Administered 2022-01-06 – 2022-01-07 (×2): 8 [IU] via SUBCUTANEOUS
  Filled 2022-01-06 (×2): qty 0.08

## 2022-01-06 MED ORDER — EMPAGLIFLOZIN-METFORMIN HCL 12.5-1000 MG PO TABS: 1.0000 | ORAL_TABLET | Freq: Two times a day (BID) | ORAL | Status: DC

## 2022-01-06 MED ORDER — HALOPERIDOL LACTATE 5 MG/ML IJ SOLN
2.0000 mg | Freq: Four times a day (QID) | INTRAMUSCULAR | Status: DC | PRN
  Administered 2022-01-06: 2 mg via INTRAVENOUS
  Filled 2022-01-06: qty 1

## 2022-01-06 MED ORDER — HALOPERIDOL LACTATE 5 MG/ML IJ SOLN: 2.0000 mg | Freq: Four times a day (QID) | INTRAMUSCULAR | Status: DC | PRN

## 2022-01-06 MED ORDER — KETOROLAC TROMETHAMINE 15 MG/ML IJ SOLN
15.0000 mg | Freq: Once | INTRAMUSCULAR | Status: AC
  Administered 2022-01-06: 15 mg via INTRAVENOUS
  Filled 2022-01-06: qty 1

## 2022-01-06 MED ORDER — ACETAMINOPHEN 650 MG RE SUPP: 650.0000 mg | Freq: Four times a day (QID) | RECTAL | Status: DC | PRN

## 2022-01-06 MED ORDER — DOFETILIDE 250 MCG PO CAPS
250.0000 ug | ORAL_CAPSULE | Freq: Two times a day (BID) | ORAL | Status: DC
  Administered 2022-01-06 – 2022-01-17 (×22): 250 ug via ORAL
  Filled 2022-01-06 (×26): qty 1

## 2022-01-06 MED ORDER — MELATONIN 5 MG PO TABS
5.0000 mg | ORAL_TABLET | Freq: Every day | ORAL | Status: DC
  Administered 2022-01-06: 5 mg via ORAL
  Filled 2022-01-06: qty 1

## 2022-01-06 MED ORDER — POTASSIUM CHLORIDE CRYS ER 20 MEQ PO TBCR
20.0000 meq | EXTENDED_RELEASE_TABLET | Freq: Every day | ORAL | Status: DC
  Administered 2022-01-06 – 2022-01-17 (×12): 20 meq via ORAL
  Filled 2022-01-06 (×12): qty 1

## 2022-01-06 MED ORDER — FLUCONAZOLE 150 MG PO TABS
150.0000 mg | ORAL_TABLET | Freq: Once | ORAL | Status: AC
  Administered 2022-01-06: 150 mg via ORAL
  Filled 2022-01-06: qty 1

## 2022-01-06 MED ORDER — EMPAGLIFLOZIN 25 MG PO TABS
25.0000 mg | ORAL_TABLET | Freq: Every day | ORAL | Status: DC
  Administered 2022-01-06 – 2022-01-07 (×2): 25 mg via ORAL
  Filled 2022-01-06 (×4): qty 1

## 2022-01-06 MED ORDER — EZETIMIBE 10 MG PO TABS
10.0000 mg | ORAL_TABLET | Freq: Every day | ORAL | Status: DC
  Administered 2022-01-06 – 2022-01-07 (×2): 10 mg via ORAL
  Filled 2022-01-06 (×2): qty 1

## 2022-01-06 MED ORDER — HALOPERIDOL LACTATE 5 MG/ML IJ SOLN
3.0000 mg | Freq: Once | INTRAMUSCULAR | Status: AC
  Administered 2022-01-06: 3 mg via INTRAVENOUS
  Filled 2022-01-06: qty 1

## 2022-01-06 MED ORDER — LOSARTAN POTASSIUM 50 MG PO TABS
50.0000 mg | ORAL_TABLET | Freq: Every day | ORAL | Status: DC
  Administered 2022-01-06 – 2022-01-17 (×12): 50 mg via ORAL
  Filled 2022-01-06 (×12): qty 1

## 2022-01-06 MED ORDER — MELATONIN 5 MG PO TABS
5.0000 mg | ORAL_TABLET | Freq: Once | ORAL | Status: AC
  Administered 2022-01-06: 5 mg via ORAL
  Filled 2022-01-06: qty 1

## 2022-01-06 MED ORDER — APIXABAN 5 MG PO TABS
5.0000 mg | ORAL_TABLET | Freq: Two times a day (BID) | ORAL | Status: DC
  Administered 2022-01-06 – 2022-01-17 (×23): 5 mg via ORAL
  Filled 2022-01-06 (×23): qty 1

## 2022-01-06 MED ORDER — FUROSEMIDE 20 MG PO TABS
20.0000 mg | ORAL_TABLET | Freq: Every day | ORAL | Status: DC
  Administered 2022-01-06 – 2022-01-07 (×2): 20 mg via ORAL
  Filled 2022-01-06 (×2): qty 1

## 2022-01-06 MED ORDER — DULOXETINE HCL 30 MG PO CPEP
30.0000 mg | ORAL_CAPSULE | Freq: Every day | ORAL | Status: DC
  Administered 2022-01-06: 30 mg via ORAL
  Filled 2022-01-06: qty 1

## 2022-01-06 NOTE — H&P (Signed)
History and Physical    Patient: Melanie Lucas HEN:[id number] DOB: 1950/04/09 DOA: 01/05/2022 DOS: the patient was seen and examined on 01/06/2022 PCP: Sue Lush, PA-C  Patient coming from: Home  Chief Complaint:  Chief Complaint  Patient presents with   Altered Mental Status   HPI: Melanie Lucas is a 71 y.o. female with medical history significant of anxiety, depression, chronic diastolic CHF, chronic acquired lymphedema, COPD, CAD, right ear deafness, type 2 diabetes, diabetic peripheral neuropathy, elevated TSH, history of hematuria, history of nephrolithiasis, hyperlipidemia, hypertension, incontinence, left carotid stenosis Mn[place] disease, history of obesity now with a normal weight, osteoarthritis, history of TIA x2, tobacco abuse, paroxysmal atrial fibrillation on Tikosyn/apixaban who is brought to the emergency department due to altered mental status.  She has been delirious and talking in [place].  She usually does not speak in [place].  She had a fall 4 days ago and was discharged in stable condition.  Following the fall, the patient has difficulty talking, following commands, walking and tolerating oral intake.  She was unable to get out of her chair in the morning yesterday.  The patient's daughter also stated that she has had 3 falls from the bedside, with no apparent injury.  She was seen on her baseline mental status for the last time about 4 days ago.  The patient was oriented to name and partially oriented to place when seen this morning.  However, she cannot elaborate on her HPI.  She stated she had a frontal headache, but no abdominal, back or chest pain.  ED course: Initial vital signs were temperature 98.8 F, pulse 71, respiration 18, BP 159/92 mmHg O2 sat 99% on room air.  The patient received 1 g of ceftriaxone IVPB, ketorolac 15 mg IVP, melatonin 5 mg p.o. x1 and 2000 mL of normal saline bolus.  Lab work: Urinalysis with glucosuria more than 500, ketonuria of 20 mg  deciliter.  There was moderate leukocyte esterase and more than 50 WBC on hpf.  No bacteria seen.  CBC with a white count of 6.1, hemoglobin 15.5 g/dL platelets 237.  Troponin was 23 and then 22 ng/L.  Ammonia level was 17.  CMP with a glucose of 145 mg/deciliter, but otherwise normal.  Imaging: Portable chest with cardiomegaly and mild pulmonary vascular congestion.  Pelvics x-ray with no acute abnormality.  CT head x2 with no acute findings.  CT cervical spine with no acute fractures or subluxation.  CT abdomen/pelvis with mild bladder wall thickening and perivesicular fat stranding suspicious for cystitis.  No acute traumatic injury to the abdomen or pelvis.  Stable benign bilateral adrenal adenomas.  On change right upper kidney lesion, likely a cyst.  Colonic diverticulosis.  Aortic atherosclerosis.  MRI of brain without contrast with no acute intracranial normality.  There were multiple old small vessels in far on the white matter and advanced atrophy.  CTA head and neck with no emergent large vessel occlusion or hemodynamically significant stenosis of the head and neck.  There is mild bilateral carotid bifurcation atherosclerosis without hemodynamically significant stenosis.  Aortic atherosclerosis.  Please see images and full radiology report for further details.   Review of Systems: As mentioned in the history of present illness. All other systems reviewed and are negative. Past Medical History:  Diagnosis Date   Anxiety    Cancer ([place])    Skin- leg    Chronic acquired lymphedema    a. R>L;  b. 03/2012 Neg LE U/S for DVT. Legs  since age 60   Complication of anesthesia    woke up during colonoscopy, Lithrostrixpy, Biospy   COPD (chronic obstructive pulmonary disease) ([place])    Coronary artery disease    Deaf, right    Diabetes mellitus    Type II   Diabetic neuropathy ([place])    Elevated TSH    a. 10/2012 - inst to f/u PCP.   Full dentures    Hematuria    a. while on pradaxa,  she reports  that she has seen Dr Margie Ege and had low risk cystoscopy   History of kidney stones    Hyperlipidemia    Hypertension    patient denies   Incontinence    prior to urinating   Lacunar infarction [facility])    a. 02/2009 non-acute Lacunar infarct of the right thalamus noted on MRI of brain.   Left carotid stenosis    Lymphedema of extremity    had this problem, since she was a teenager. especially seen in R LE   Meniere disease    Myocardial infarction [facility])    Obesity    Osteoarthritis    cervical & lumbar region, knees, hands cramp also    PAF with post-termination pauses    a. on dronedarone;  b. CHA2DS2VASc = 5 (HTN, DM, h/o lacunar infarct on MRI, Female) ->refused oral anticoagulation after h/o hematuria on pradaxa;  c. 02/2012 Echo: EF 55-60%, mildly dil LA. D. Recurrent PAF 10/2012 after only taking Multaq 1x/day - spont conv to NSR, placed back on BID Multaq/eliquis;  e. 10/2014 Multaq d/c'd->tikosyn initiated.   Shortness of breath dyspnea    with exertion   Stroke [facility])    2 mini strokes   Tobacco abuse    Vision abnormalities    Past Surgical History:  Procedure Laterality Date   ANTERIOR CERVICAL CORPECTOMY N/A 02/21/2016   Procedure: ANTERIOR CERVICAL CORPECTOMY ANS FUSION CERVICAL SIX , ANTERIOR PLATING CERVICAL FIVE-SEVEN;  Surgeon: Consuella Lose, MD;  Location: [place];  Service: Neurosurgery;  Laterality: N/A;   ANTERIOR FUSION CERVICAL SPINE  02/21/2016   Biopsy of adrenal glands     BREAST BIOPSY Bilateral    benign results, both breasts    CARDIAC CATHETERIZATION  2014   x1 stent placed, done in [place]    CARDIOVERSION N/A 11/11/2014   Procedure: CARDIOVERSION;  Surgeon: Larey Dresser, MD;  Location: [place];  Service: Cardiovascular;  Laterality: N/A;   CAROTID ENDARTERECTOMY Left 07/29/2017   COLONOSCOPY W/ POLYPECTOMY     CORONARY STENT PLACEMENT     ENDARTERECTOMY Left 07/29/2017   Procedure: ENDARTERECTOMY CAROTID LEFT;  Surgeon: Rosetta Posner, MD;   Location: [place];  Service: Vascular;  Laterality: Left;   PATCH ANGIOPLASTY Left 07/29/2017   Procedure: PATCH ANGIOPLASTY USING HEMASHIELD GOLD VASCULAR PATCH;  Surgeon: Rosetta Posner, MD;  Location: [place];  Service: Vascular;  Laterality: Left;   TEE WITHOUT CARDIOVERSION N/A 11/11/2014   Procedure: TRANSESOPHAGEAL ECHOCARDIOGRAM (TEE);  Surgeon: Larey Dresser, MD;  Location: [place];  Service: Cardiovascular;  Laterality: N/A;   Social History:  reports that she has been smoking cigarettes. She has a 55.00 pack-year smoking history. She has never used smokeless tobacco. She reports current alcohol use of about 1.0 standard drink of alcohol per week. She reports that she does not use drugs.  Allergies  Allergen Reactions   Alendronate Sodium Other (See Comments)    pain all over   Avelox [Moxifloxacin] Other (See Comments)  Unknown reaction    Fosamax [Alendronate] Other (See Comments)    Pain all over   Pradaxa [Dabigatran Etexilate Mesylate] Other (See Comments)    Extreme bleeding   Statins Nausea And Vomiting and Other (See Comments)    Muscle pain, Dizziness (intolerance)   Sulfa Antibiotics Nausea And Vomiting   Sulfamethoxazole Nausea And Vomiting    Dizziness (intolerance)   Sulfonamide Derivatives Hives    Nausea vertigo   Varenicline Other (See Comments)    nightmares   Ciprofloxacin Rash    Other reaction(s): unknown   Gabapentin Rash    burning   Tape Itching and Rash    Please use "paper" tape only    Family History  Problem Relation Age of Onset   Heart attack Father    Coronary artery disease Father        strong family hx   Bladder Cancer Mother        bladder   Leukemia Brother    Breast cancer Maternal Aunt    Breast cancer Paternal Aunt    Breast cancer Maternal Aunt     Prior to Admission medications   Medication Sig Start Date End Date Taking? Authorizing Provider  acetaminophen (TYLENOL) 325 MG tablet Take 2 tablets (650 mg total) by  mouth every 6 (six) hours as needed for mild pain (or Fever >/= 101). 09/04/21  Yes Elgergawy, Silver Huguenin, MD  apixaban (ELIQUIS) 5 MG TABS tablet Take 5 mg by mouth 2 (two) times daily.   Yes [provider]  dofetilide (TIKOSYN) 250 MCG capsule Take 1 capsule (250 mcg total) by mouth 2 (two) times daily. 08/09/21  Yes Sherran Needs, NP  DULoxetine (CYMBALTA) 30 MG capsule Take 30 mg by mouth at bedtime. 08/02/21  Yes [provider]  Empagliflozin-metFORMIN HCl (SYNJARDY) 12.06-998 MG TABS Take 1 tablet by mouth in the morning and at bedtime.   Yes [provider]  ezetimibe (ZETIA) 10 MG tablet Take 10 mg by mouth daily.   Yes [provider]  furosemide (LASIX) 20 MG tablet Take 20 mg by mouth daily.   Yes [provider]  insulin glargine (LANTUS) 100 UNIT/ML injection Inject 8 Units into the skin daily.   Yes [provider]  losartan (COZAAR) 50 MG tablet TAKE 1 TABLET BY MOUTH EVERY DAY Patient taking differently: Take 50 mg by mouth daily. 03/20/21  Yes Allred, Jeneen Rinks, MD  melatonin 5 MG TABS Take 1 tablet (5 mg total) by mouth at bedtime. 09/04/21  Yes Elgergawy, Silver Huguenin, MD  mirabegron ER (MYRBETRIQ) 50 MG TB24 tablet Take 50 mg by mouth at bedtime.   Yes [provider]  nitrofurantoin, macrocrystal-monohydrate, (MACROBID) 100 MG capsule Take 100 mg by mouth daily.   Yes [provider]  potassium chloride SA (KLOR-CON M) 20 MEQ tablet Take 1 tablet (20 mEq total) by mouth daily. 08/01/21  Yes Dwyane Dee, MD  vitamin B-12 (CYANOCOBALAMIN) 500 MCG tablet Take 500 mcg by mouth daily.   Yes [provider]  insulin aspart (NOVOLOG) 100 UNIT/ML injection Inject 0-15 Units into the skin 3 (three) times daily with meals. Patient not taking: Reported on 01/06/2022 09/04/21   Elgergawy, Silver Huguenin, MD  insulin aspart (NOVOLOG) 100 UNIT/ML injection Inject 0-5 Units into the skin at bedtime. Patient not taking: Reported  on 01/06/2022 09/04/21   Elgergawy, Silver Huguenin, MD  insulin glargine-yfgn (SEMGLEE) 100 UNIT/ML injection Inject 0.08 mLs (8 Units total) into the skin daily. Patient  not taking: Reported on 01/06/2022 09/05/21   Elgergawy, Silver Huguenin, MD  linaclotide Rolan Lipa) 290 MCG CAPS capsule Take 290 mcg by mouth daily before breakfast. Patient not taking: Reported on 01/06/2022    [provider]  QUEtiapine (SEROQUEL) 25 MG tablet Take 1 tablet (25 mg total) by mouth at bedtime. Patient not taking: Reported on 01/06/2022 09/04/21   Elgergawy, Silver Huguenin, MD    Physical Exam: Vitals:   01/05/22 2100 01/05/22 2246 01/06/22 0042 01/06/22 0307  BP: (!) 175/86 (!) 172/86 (!) 172/97 (!) 164/72  Pulse: 70 (!) 52 76 63  Resp: '15 16 16 16  '$ Temp:  98.2 F (36.8 C)  97.6 F (36.4 C)  TempSrc:  Oral  Oral  SpO2: 99% 96% 99% 97%  Weight:    65.3 kg  Height:    '5\' 7"'$  (1.702 m)   Physical Exam Vitals reviewed.  Constitutional:      General: She is awake. She is not in acute distress. HENT:     Head: Normocephalic.     Nose: No rhinorrhea.     Mouth/Throat:     Mouth: Mucous membranes are dry.  Eyes:     General: No scleral icterus.    Pupils: Pupils are equal, round, and reactive to light.  Neck:     Vascular: No JVD.  Cardiovascular:     Rate and Rhythm: Normal rate and regular rhythm.     Heart sounds: S1 normal and S2 normal.  Pulmonary:     Effort: Pulmonary effort is normal.     Breath sounds: Normal breath sounds. No wheezing, rhonchi or rales.  Abdominal:     General: Bowel sounds are normal. There is no distension.     Palpations: Abdomen is soft.     Tenderness: There is no abdominal tenderness. There is no guarding.  Musculoskeletal:     Cervical back: Neck supple.     Right lower leg: No edema.     Left lower leg: No edema.  Skin:    General: Skin is warm and dry.  Neurological:     General: No focal deficit present.     Mental Status: She is alert. She is disoriented.   Psychiatric:        Mood and Affect: Mood normal.        Behavior: Behavior is cooperative.     Data Reviewed:  There are no new results to review at this time.  03/07/2021 echocardiogram IMPRESSIONS    1. Left ventricular ejection fraction, by estimation, is 55 to 60%. The  left ventricle has normal function. The left ventricle has no regional  wall motion abnormalities. There is mild concentric left ventricular  hypertrophy. Left ventricular diastolic  parameters are consistent with Grade I diastolic dysfunction (impaired  relaxation).   2. Right ventricular systolic function is normal. The right ventricular  size is normal. Tricuspid regurgitation signal is inadequate for assessing  PA pressure.   3. The mitral valve is grossly normal. Trivial mitral valve  regurgitation. No evidence of mitral stenosis.   4. The aortic valve is tricuspid. Aortic valve regurgitation is not  visualized. No aortic stenosis is present.   5. The inferior vena cava is normal in size with greater than 50%  respiratory variability, suggesting right atrial pressure of 3 mmHg.   EKG: Vent. rate 61 BPM PR interval 132 ms QRS duration 132 ms QT/QTcB 450/454 ms P-R-T axes 89 -76 93 Sinus rhythm Supraventricular bigeminy RBBB and LAFB Probable  left ventricular hypertrophy Nonspecific T abnormalities, lateral leads  Assessment and Plan: Principal Problem:   Acute metabolic encephalopathy In the setting of:   Acute cystitis  Admit to telemetry/inpatient. Continue ceftriaxone 1 g IVPB daily. Follow urine culture and sensitivity. Follow-up blood culture and sensitivity. Supportive care. Low-dose haloperidol for restlessness. Follow-up CBC and chemistry in the morning.  Active Problems:   Atrial fibrillation (HCC) CHA2DS2-VASc Score of 8. Continue Tikosyn 200 mg p.o. twice daily.  Continue apixaban 5 mg p.o. twice daily.    Chronic diastolic CHF (congestive heart failure) (HCC) No signs  of decompensation. Continue ARB and diuretic.    Essential hypertension, benign Continue losartan 50 mg p.o. daily.   Continue furosemide 20 mg p.o. daily.    Coronary artery disease involving native  coronary artery of native heart without angina pectoris On apixaban and losartan.    Pulmonary hypertension (HCC) No signs of decompensation at this time.    Type 2 diabetes mellitus without complication,    without long-term current use of insulin (HCC) Carbohydrate modified diet. Continue Lantus 80 units in AM. Continue Jardiance, but hold metformin.    COPD (chronic obstructive pulmonary disease) ([place]) Supplemental oxygen as needed. Bronchodilators as needed.    Depression Continue duloxetine 30 mg p.o. bedtime.    Memory loss Continue depression treatment. Supportive care.    Advance Care Planning:   Code Status: Full Code   Consults:   Family Communication:   Severity of Illness: The appropriate patient status for this patient is INPATIENT. Inpatient status is judged to be reasonable and necessary in order to provide the required intensity of service to ensure the patient's safety. The patient's presenting symptoms, physical exam findings, and initial radiographic and laboratory data in the context of their chronic comorbidities is felt to place them at high risk for further clinical deterioration. Furthermore, it is not anticipated that the patient will be medically stable for discharge from the hospital within 2 midnights of admission.   * I certify that at the point of admission it is my clinical judgment that the patient will require inpatient hospital care spanning beyond 2 midnights from the point of admission due to high intensity of service, high risk for further deterioration and high frequency of surveillance required.*  Author: Reubin Milan, MD 01/06/2022 9:16 AM  For on call review [email or web address].   This document was prepared using Dragon voice  recognition software and may contain some unintended transcription errors.

## 2022-01-06 NOTE — Plan of Care (Signed)

## 2022-01-06 NOTE — Progress Notes (Signed)
Patient is transferred from ED to [place] at 0300. Confused and sleep. Family at bedside. Vital signs was taken. Room is set up. Bed alarm is on and floor matts was applied.

## 2022-01-06 NOTE — ED Notes (Signed)
ED TO INPATIENT HANDOFF REPORT  ED Nurse Name and Phone #: Elpidio Eric [phone]  S Name/Age/Gender Melanie Lucas 71 y.o. female Room/Bed: WA02/WA02  Code Status   Code Status: Full Code  Home/SNF/Other Home Disoriented Is this baseline?  No  Triage Complete: Triage complete  Chief Complaint Acute encephalopathy [G93.40]  Triage Note Patient had a fall 3 days ago, head scans were negative for a bleed after falling on her face. They said they think she has a concussion. Patients responses are minimal, decreased motility, speaking in spanish, repetitive movements. Patient is on Eloquis.    Allergies Allergies  Allergen Reactions   Alendronate Sodium Other (See Comments)    pain all over   Avelox [Moxifloxacin] Other (See Comments)    Unknown reaction    Fosamax [Alendronate] Other (See Comments)    Pain all over   Pradaxa [Dabigatran Etexilate Mesylate] Other (See Comments)    Extreme bleeding   Statins Nausea And Vomiting and Other (See Comments)    Muscle pain, Dizziness (intolerance)   Sulfa Antibiotics Nausea And Vomiting   Sulfamethoxazole Nausea And Vomiting    Dizziness (intolerance)   Sulfonamide Derivatives Hives    Nausea vertigo   Varenicline Other (See Comments)    nightmares   Ciprofloxacin Rash    Other reaction(s): unknown   Gabapentin Rash    burning   Tape Itching and Rash    Please use "paper" tape only    Level of Care/Admitting Diagnosis ED Disposition     ED Disposition  Admit   Condition  --   Comment  Hospital Area: [place] [[id number]]  Level of Care: Telemetry [5]  Admit to tele based on following criteria: Monitor for Ischemic changes  May admit patient to [facility] or [facility] if equivalent level of care is available:: No  Covid Evaluation: Asymptomatic - no recent exposure (last 10 days) testing not required  Diagnosis: Acute encephalopathy [941740]  Admitting Physician: Rhetta Mura [[id number]]   Attending Physician: Rhetta Mura [[id number]]  Certification:: I certify this patient will need inpatient services for at least 2 midnights  Estimated Length of Stay: 2          B Medical/Surgery History Past Medical History:  Diagnosis Date   Anxiety    Cancer ([place])    Skin- leg    Chronic acquired lymphedema    a. R>L;  b. 03/2012 Neg LE U/S for DVT. Legs since age 1   Complication of anesthesia    woke up during colonoscopy, Lithrostrixpy, Biospy   COPD (chronic obstructive pulmonary disease) ([place])    Coronary artery disease    Deaf, right    Diabetes mellitus    Type II   Diabetic neuropathy ([place])    Elevated TSH    a. 10/2012 - inst to f/u PCP.   Full dentures    Hematuria    a. while on pradaxa,  she reports that she has seen Dr Margie Ege and had low risk cystoscopy   History of kidney stones    Hyperlipidemia    Hypertension    patient denies   Incontinence    prior to urinating   Lacunar infarction [facility])    a. 02/2009 non-acute Lacunar infarct of the right thalamus noted on MRI of brain.   Left carotid stenosis    Lymphedema of extremity    had this problem, since she was a teenager. especially seen in R LE   Meniere disease  Myocardial infarction [facility])    Obesity    Osteoarthritis    cervical & lumbar region, knees, hands cramp also    PAF with post-termination pauses    a. on dronedarone;  b. CHA2DS2VASc = 5 (HTN, DM, h/o lacunar infarct on MRI, Female) ->refused oral anticoagulation after h/o hematuria on pradaxa;  c. 02/2012 Echo: EF 55-60%, mildly dil LA. D. Recurrent PAF 10/2012 after only taking Multaq 1x/day - spont conv to NSR, placed back on BID Multaq/eliquis;  e. 10/2014 Multaq d/c'd->tikosyn initiated.   Shortness of breath dyspnea    with exertion   Stroke [facility])    2 mini strokes   Tobacco abuse    Vision abnormalities    Past Surgical History:  Procedure Laterality Date   ANTERIOR CERVICAL CORPECTOMY N/A 02/21/2016   Procedure: ANTERIOR  CERVICAL CORPECTOMY ANS FUSION CERVICAL SIX , ANTERIOR PLATING CERVICAL FIVE-SEVEN;  Surgeon: Consuella Lose, MD;  Location: [place];  Service: Neurosurgery;  Laterality: N/A;   ANTERIOR FUSION CERVICAL SPINE  02/21/2016   Biopsy of adrenal glands     BREAST BIOPSY Bilateral    benign results, both breasts    CARDIAC CATHETERIZATION  2014   x1 stent placed, done in [place]    CARDIOVERSION N/A 11/11/2014   Procedure: CARDIOVERSION;  Surgeon: Larey Dresser, MD;  Location: [place];  Service: Cardiovascular;  Laterality: N/A;   CAROTID ENDARTERECTOMY Left 07/29/2017   COLONOSCOPY W/ POLYPECTOMY     CORONARY STENT PLACEMENT     ENDARTERECTOMY Left 07/29/2017   Procedure: ENDARTERECTOMY CAROTID LEFT;  Surgeon: Rosetta Posner, MD;  Location: [place];  Service: Vascular;  Laterality: Left;   PATCH ANGIOPLASTY Left 07/29/2017   Procedure: PATCH ANGIOPLASTY USING HEMASHIELD GOLD VASCULAR PATCH;  Surgeon: Rosetta Posner, MD;  Location: [place];  Service: Vascular;  Laterality: Left;   TEE WITHOUT CARDIOVERSION N/A 11/11/2014   Procedure: TRANSESOPHAGEAL ECHOCARDIOGRAM (TEE);  Surgeon: Larey Dresser, MD;  Location: [facility] ENDOSCOPY;  Service: Cardiovascular;  Laterality: N/A;     A IV Location/Drains/Wounds Patient Lines/Drains/Airways Status     Active Line/Drains/Airways     Name Placement date Placement time Site Days   Peripheral IV 01/05/22 20 G Left Antecubital 01/05/22  1906  Antecubital  1            Intake/Output Last 24 hours No intake or output data in the 24 hours ending 01/06/22 0203  Labs/Imaging Results for orders placed or performed during the hospital encounter of 01/05/22 (from the past 48 hour(s))  CBC with Differential     Status: Abnormal   Collection Time: 01/05/22  6:56 PM  Result Value Ref Range   WBC 6.1 4.0 - 10.5 K/uL   RBC 4.99 3.87 - 5.11 MIL/uL   Hemoglobin 15.5 (H) 12.0 - 15.0 g/dL   HCT 46.2 (H) 36.0 - 46.0 %   MCV 92.6 80.0 - 100.0 fL   MCH 31.1  26.0 - 34.0 pg   MCHC 33.5 30.0 - 36.0 g/dL   RDW 13.0 11.5 - 15.5 %   Platelets 237 150 - 400 K/uL   nRBC 0.0 0.0 - 0.2 %   Neutrophils Relative % 65 %   Neutro Abs 4.0 1.7 - 7.7 K/uL   Lymphocytes Relative 25 %   Lymphs Abs 1.5 0.7 - 4.0 K/uL   Monocytes Relative 8 %   Monocytes Absolute 0.5 0.1 - 1.0 K/uL   Eosinophils Relative 1 %   Eosinophils Absolute 0.1 0.0 -  0.5 K/uL   Basophils Relative 1 %   Basophils Absolute 0.0 0.0 - 0.1 K/uL   Immature Granulocytes 0 %   Abs Immature Granulocytes 0.02 0.00 - 0.07 K/uL    Comment: Performed at [facility], [place] [address] [place], [place] [address]  Comprehensive metabolic panel     Status: Abnormal   Collection Time: 01/05/22  6:56 PM  Result Value Ref Range   Sodium 142 135 - 145 mmol/L   Potassium 3.5 3.5 - 5.1 mmol/L   Chloride 109 98 - 111 mmol/L   CO2 24 22 - 32 mmol/L   Glucose, Bld 145 (H) 70 - 99 mg/dL    Comment: Glucose reference range applies only to samples taken after fasting for at least 8 hours.   BUN 18 8 - 23 mg/dL   Creatinine, Ser 0.62 0.44 - 1.00 mg/dL   Calcium 9.1 8.9 - 10.3 mg/dL   Total Protein 6.6 6.5 - 8.1 g/dL   Albumin 3.8 3.5 - 5.0 g/dL   AST 18 15 - 41 U/L   ALT 17 0 - 44 U/L   Alkaline Phosphatase 70 38 - 126 U/L   Total Bilirubin 1.0 0.3 - 1.2 mg/dL   GFR, Estimated >60 >60 mL/min    Comment: (NOTE) Calculated using the CKD-EPI Creatinine Equation (2021)    Anion gap 9 5 - 15    Comment: Performed at [facility], [place] [address], [place], [place] [address]  Magnesium     Status: None   Collection Time: 01/05/22  6:56 PM  Result Value Ref Range   Magnesium 2.1 1.7 - 2.4 mg/dL    Comment: Performed at [facility], [place] [address], [place], [place] [address]  Troponin I (High Sensitivity)     Status: Abnormal   Collection Time: 01/05/22  6:56 PM  Result Value Ref Range   Troponin I (High Sensitivity) 23 (H) <18 ng/L    Comment:  (NOTE) Elevated high sensitivity troponin I (hsTnI) values and significant  changes across serial measurements may suggest ACS but many other  chronic and acute conditions are known to elevate hsTnI results.  Refer to the "Links" section for chest pain algorithms and additional  guidance. Performed at [facility], [name] [address], [place],  [address]   Resp Panel by RT-PCR (Flu A&B, Covid) Anterior Nasal Swab     Status: None   Collection Time: 01/05/22  6:57 PM   Specimen: Anterior Nasal Swab  Result Value Ref Range   SARS Coronavirus 2 by RT PCR NEGATIVE NEGATIVE    Comment: (NOTE) SARS-CoV-2 target nucleic acids are NOT DETECTED.  The SARS-CoV-2 RNA is generally detectable in upper respiratory specimens during the acute phase of infection. The lowest concentration of SARS-CoV-2 viral copies this assay can detect is 138 copies/mL. A negative result does not preclude SARS-Cov-2 infection and should not be used as the sole basis for treatment or other patient management decisions. A negative result may occur with  improper specimen collection/handling, submission of specimen other than nasopharyngeal swab, presence of viral mutation(s) within the areas targeted by this assay, and inadequate number of viral copies(<138 copies/mL). A negative result must be combined with clinical observations, patient history, and epidemiological information. The expected result is Negative.  Fact Sheet for Patients:  EntrepreneurPulse.com.au  Fact Sheet for Healthcare Providers:  IncredibleEmployment.be  This test is no t yet approved or cleared by the [place] FDA and  has  been authorized for detection and/or diagnosis of SARS-CoV-2 by FDA under an Emergency Use Authorization (EUA). This EUA will remain  in effect (meaning this test can be used) for the duration of the COVID-19 declaration under Section 564(b)(1) of the Act,  21 U.S.C.section 360bbb-3(b)(1), unless the authorization is terminated  or revoked sooner.       Influenza A by PCR NEGATIVE NEGATIVE   Influenza B by PCR NEGATIVE NEGATIVE    Comment: (NOTE) The Xpert Xpress SARS-CoV-2/FLU/RSV plus assay is intended as an aid in the diagnosis of influenza from Nasopharyngeal swab specimens and should not be used as a sole basis for treatment. Nasal washings and aspirates are unacceptable for Xpert Xpress SARS-CoV-2/FLU/RSV testing.  Fact Sheet for Patients: EntrepreneurPulse.com.au  Fact Sheet for Healthcare Providers: IncredibleEmployment.be  This test is not yet approved or cleared by the [place] FDA and has been authorized for detection and/or diagnosis of SARS-CoV-2 by FDA under an Emergency Use Authorization (EUA). This EUA will remain in effect (meaning this test can be used) for the duration of the COVID-19 declaration under Section 564(b)(1) of the Act, 21 U.S.C. section 360bbb-3(b)(1), unless the authorization is terminated or revoked.  Performed at [facility], [place] [address], [place], [place] [address]   Ammonia     Status: None   Collection Time: 01/05/22  7:43 PM  Result Value Ref Range   Ammonia 17 9 - 35 umol/L    Comment: Performed at [facility], [place] [address] [place], [place] [address]  Blood gas, venous (at [facility], not at [facility])     Status: Abnormal   Collection Time: 01/05/22  7:43 PM  Result Value Ref Range   pH, Ven 7.42 7.25 - 7.43   pCO2, Ven 40 (L) 44 - 60 mmHg   pO2, Ven 54 (H) 32 - 45 mmHg   Bicarbonate 25.9 20.0 - 28.0 mmol/L   Acid-Base Excess 1.3 0.0 - 2.0 mmol/L   O2 Saturation 89.8 %   Patient temperature 37.0     Comment: Performed at [facility], [place] [address] [place], [place] [address]  Urinalysis, Routine w reflex microscopic Urine, In & Out Cath     Status: Abnormal   Collection Time:  01/05/22  8:36 PM  Result Value Ref Range   Color, Urine YELLOW YELLOW   APPearance HAZY (A) CLEAR   Specific Gravity, Urine 1.033 (H) 1.005 - 1.030   pH 5.0 5.0 - 8.0   Glucose, UA >=500 (A) NEGATIVE mg/dL   Hgb urine dipstick NEGATIVE NEGATIVE   Bilirubin Urine NEGATIVE NEGATIVE   Ketones, ur 20 (A) NEGATIVE mg/dL   Protein, ur NEGATIVE NEGATIVE mg/dL   Nitrite NEGATIVE NEGATIVE   Leukocytes,Ua MODERATE (A) NEGATIVE   WBC, UA >50 (H) 0 - 5 WBC/hpf   Bacteria, UA NONE SEEN NONE SEEN   Squamous Epithelial / LPF 0-5 0 - 5   WBC Clumps PRESENT    Budding Yeast PRESENT     Comment: Performed at [facility], [place] [address] [place], [place] [address]  Troponin I (High Sensitivity)     Status: Abnormal   Collection Time: 01/05/22  8:55 PM  Result Value Ref Range   Troponin I (High Sensitivity) 22 (H) <18 ng/L    Comment: (NOTE) Elevated high sensitivity troponin I (hsTnI) values and significant  changes across serial measurements may suggest ACS but many other  chronic and acute conditions are known to elevate  hsTnI results.  Refer to the "Links" section for chest pain algorithms and additional  guidance. Performed at [facility], [place] [address], [place], [place] [address]    CT Head Wo Contrast  Result Date: 01/06/2022 CLINICAL DATA:  Found down EXAM: CT HEAD WITHOUT CONTRAST TECHNIQUE: Contiguous axial images were obtained from the base of the skull through the vertex without intravenous contrast. RADIATION DOSE REDUCTION: This exam was performed according to the departmental dose-optimization program which includes automated exposure control, adjustment of the mA and/or kV according to patient size and/or use of iterative reconstruction technique. COMPARISON:  01/05/2022 FINDINGS: Brain: There is no mass, hemorrhage or extra-axial collection. There is generalized atrophy without lobar predilection. Hypodensity of the white matter is most  commonly associated with chronic microvascular disease. Vascular: No abnormal hyperdensity of the major intracranial arteries or dural venous sinuses. No intracranial atherosclerosis. Skull: The visualized skull base, calvarium and extracranial soft tissues are normal. Sinuses/Orbits: No fluid levels or advanced mucosal thickening of the visualized paranasal sinuses. No mastoid or middle ear effusion. The orbits are normal. IMPRESSION: 1. No acute intracranial abnormality. 2. Generalized atrophy and findings of chronic microvascular disease. Electronically Signed   By: Ulyses Jarred M.D.   On: 01/06/2022 00:43   CT ANGIO HEAD NECK W WO CM  Result Date: 01/05/2022 CLINICAL DATA:  Recent fall.  Slurred speech. EXAM: CT ANGIOGRAPHY HEAD AND NECK TECHNIQUE: Multidetector CT imaging of the head and neck was performed using the standard protocol during bolus administration of intravenous contrast. Multiplanar CT image reconstructions and MIPs were obtained to evaluate the vascular anatomy. Carotid stenosis measurements (when applicable) are obtained utilizing NASCET criteria, using the distal internal carotid diameter as the denominator. RADIATION DOSE REDUCTION: This exam was performed according to the departmental dose-optimization program which includes automated exposure control, adjustment of the mA and/or kV according to patient size and/or use of iterative reconstruction technique. CONTRAST:  8m OMNIPAQUE IOHEXOL 350 MG/ML SOLN COMPARISON:  09/12/2021 FINDINGS: CTA NECK FINDINGS SKELETON: There is no bony spinal canal stenosis. No lytic or blastic lesion. OTHER NECK: Normal pharynx, larynx and major salivary glands. No cervical lymphadenopathy. Unremarkable thyroid gland. UPPER CHEST: No pneumothorax or pleural effusion. No nodules or masses. AORTIC ARCH: There is calcific atherosclerosis of the aortic arch. There is no aneurysm, dissection or hemodynamically significant stenosis of the visualized portion of  the aorta. Conventional 3 vessel aortic branching pattern. The visualized proximal subclavian arteries are widely patent. RIGHT CAROTID SYSTEM: No dissection, occlusion or aneurysm. Mild atherosclerotic calcification at the carotid bifurcation without hemodynamically significant stenosis. LEFT CAROTID SYSTEM: No dissection, occlusion or aneurysm. Mild atherosclerotic calcification at the carotid bifurcation without hemodynamically significant stenosis. VERTEBRAL ARTERIES: Left dominant configuration. Both origins are clearly patent. There is no dissection, occlusion or flow-limiting stenosis to the skull base (V1-V3 segments). CTA HEAD FINDINGS POSTERIOR CIRCULATION: --Vertebral arteries: Normal V4 segments. --Inferior cerebellar arteries: Normal. --Basilar artery: Normal. --Superior cerebellar arteries: Normal. --Posterior cerebral arteries (PCA): Normal. ANTERIOR CIRCULATION: --Intracranial internal carotid arteries: Atherosclerotic calcification of the internal carotid arteries at the skull base without hemodynamically significant stenosis. --Anterior cerebral arteries (ACA): Normal. Both A1 segments are present. Patent anterior communicating artery (a-comm). --Middle cerebral arteries (MCA): Normal. VENOUS SINUSES: As permitted by contrast timing, patent. ANATOMIC VARIANTS: None Review of the MIP images confirms the above findings. IMPRESSION: 1. No emergent large vessel occlusion or hemodynamically significant stenosis of the head or neck. 2. Mild bilateral carotid bifurcation atherosclerosis without hemodynamically significant stenosis.  Aortic atherosclerosis (ICD10-I70.0). Electronically Signed   By: Ulyses Jarred M.D.   On: 01/05/2022 23:46   MR BRAIN WO CONTRAST  Result Date: 01/05/2022 CLINICAL DATA:  Acute neurologic deficit EXAM: MRI HEAD WITHOUT CONTRAST TECHNIQUE: Multiplanar, multiecho pulse sequences of the brain and surrounding structures were obtained without intravenous contrast. COMPARISON:   10/17/2021 FINDINGS: Brain: No acute infarct, mass effect or extra-axial collection. No acute or chronic hemorrhage. There is confluent hyperintense T2-weighted signal within the white matter. There is advanced atrophy. Multiple old small vessel infarcts of the white matter. The midline structures are normal. Vascular: Major flow voids are preserved. Skull and upper cervical spine: Normal calvarium and skull base. Visualized upper cervical spine and soft tissues are normal. Sinuses/Orbits:No paranasal sinus fluid levels or advanced mucosal thickening. No mastoid or middle ear effusion. Normal orbits. IMPRESSION: 1. No acute intracranial abnormality. 2. Multiple old small vessel infarcts of the white matter and advanced atrophy. Electronically Signed   By: Ulyses Jarred M.D.   On: 01/05/2022 22:18   CT ABDOMEN PELVIS W CONTRAST  Result Date: 01/05/2022 CLINICAL DATA:  Abdominal pain. Fall 3 days ago. EXAM: CT ABDOMEN AND PELVIS WITH CONTRAST TECHNIQUE: Multidetector CT imaging of the abdomen and pelvis was performed using the standard protocol following bolus administration of intravenous contrast. RADIATION DOSE REDUCTION: This exam was performed according to the departmental dose-optimization program which includes automated exposure control, adjustment of the mA and/or kV according to patient size and/or use of iterative reconstruction technique. CONTRAST:  114m OMNIPAQUE IOHEXOL 300 MG/ML  SOLN COMPARISON:  CT 12/02/2021 prior exams, additional prior exams reviewed. FINDINGS: Lower chest: Heart size upper normal. No acute airspace disease or pleural effusion. Included lower ribs are intact Hepatobiliary: Small subcapsular cyst in the right lobe of the liver is again seen. No new liver abnormality. No evidence of hepatic injury. Mild hepatic steatosis. Unremarkable gallbladder. No biliary dilatation. Pancreas: Mild parenchymal atrophy. No ductal dilatation or inflammation. Spleen: Normal in size without  focal abnormality. No evidence of injury. Adrenals/Urinary Tract: There are benign bilateral adrenal adenomas, stable over multiple prior exams. On the right this measures at least 4.3 cm, the left this measures 3.6 cm. No further follow-up is needed. No evidence of adrenal hemorrhage. No hydronephrosis. No renal calculi. Stable right renal cyst, no specific imaging follow-up is needed. There is an indeterminate 12 mm lesion arising from the upper pole of the right kidney, grossly stable from prior. Mild bladder distension, mild wall thickening and perivesicular fat stranding. Stomach/Bowel: Small hiatal hernia. Stomach otherwise unremarkable. No bowel obstruction or inflammation. Normal appendix. Moderate colonic stool burden. Occasional distal colonic diverticula without diverticulitis. Vascular/Lymphatic: Moderate aortic and branch atherosclerosis. No aortic aneurysm. No acute vascular findings. No suspicious adenopathy. Reproductive: Uterus and bilateral adnexa are unremarkable. Other: No ascites or free fluid. No free air. No abdominal wall hernia Musculoskeletal: Scoliosis with degenerative change spine. Remote fracture of the left superior and inferior pubic rami. No acute fracture of the pelvis, lumbar spine, included thoracic spine or ribs. IMPRESSION: 1. Mild bladder wall thickening and perivesicular fat stranding, suspicious for cystitis. Recommend correlation with urinalysis. 2. No acute traumatic injury to the abdomen or pelvis. 3. Stable benign bilateral adrenal adenomas. 4. Indeterminate 12 mm lesion arising from the upper right kidney, favored to represent a complex cyst. This is unchanged in size dating back to 2021 exam. Definitive characterization could be considered with renal mass protocol MRI on an elective basis. 5. Colonic diverticulosis without diverticulitis. Aortic  Atherosclerosis (ICD10-I70.0). Electronically Signed   By: Keith Rake M.D.   On: 01/05/2022 21:40   DG Pelvis 1-2  Views  Result Date: 01/05/2022 CLINICAL DATA:  Fall. EXAM: PELVIS - 1-2 VIEW COMPARISON:  One-view pelvis 01/02/2022 FINDINGS: Healed left superior and inferior pubic rami fractures are present. Hips appear located on this single view. No acute fractures are present. Degenerative changes are present the lower lumbar spine. Vascular calcifications are noted. IMPRESSION: 1. No acute abnormality. 2. Healed left superior and inferior pubic rami fractures. Electronically Signed   By: San Morelle M.D.   On: 01/05/2022 20:00   DG Chest Portable 1 View  Result Date: 01/05/2022 CLINICAL DATA:  Fall. EXAM: PORTABLE CHEST 1 VIEW COMPARISON:  One-view chest x-ray 01/02/2022 FINDINGS: The heart is enlarged. Atherosclerotic calcifications are present at the aortic arch. Mild pulmonary vascular congestion has increased since the prior exam. Acute fracture that no acute fracture present. No pneumothorax is present. Degenerative changes are present both shoulders. Mild rightward curvature is present in the upper thoracic spine. IMPRESSION: 1. Cardiomegaly and mild pulmonary vascular congestion. 2. No acute traumatic injury. 3. Aortic atherosclerosis. Electronically Signed   By: San Morelle M.D.   On: 01/05/2022 19:59   CT Cervical Spine Wo Contrast  Result Date: 01/05/2022 CLINICAL DATA:  Neck trauma (Age >= 65y) Fall 3 days ago. EXAM: CT CERVICAL SPINE WITHOUT CONTRAST TECHNIQUE: Multidetector CT imaging of the cervical spine was performed without intravenous contrast. Multiplanar CT image reconstructions were also generated. RADIATION DOSE REDUCTION: This exam was performed according to the departmental dose-optimization program which includes automated exposure control, adjustment of the mA and/or kV according to patient size and/or use of iterative reconstruction technique. COMPARISON:  Cervical spine CT 3 days ago 01/02/2022 FINDINGS: Alignment: Similar levo scoliotic curvature of the lower  cervical spine. Mild straightening of normal lordosis. No traumatic subluxation or listhesis. Skull base and vertebrae: No acute fracture. C6 corpectomy with anterior fusion from C5 through C7. The dens and skull base are intact. Remote T2 superior endplate compression deformity is unchanged. Soft tissues and spinal canal: No prevertebral fluid or swelling. No visible canal hematoma. Disc levels: Anterior fusion C5 through C7 with C6 corpectomy. Degenerative disc disease and multilevel facet hypertrophy is unchanged over the last 3 days. Posterior spurring at the level of C6-C7 causes narrowing of the spinal canal, unchanged. There is multilevel osseous neural foraminal stenosis on the right. Upper chest: No acute or unexpected findings. Other: None. IMPRESSION: 1. No acute fracture or subluxation of the cervical spine. 2. Postsurgical and degenerative change in the cervical spine that is unchanged over the last 3 days. Electronically Signed   By: Keith Rake M.D.   On: 01/05/2022 19:51   CT Head Wo Contrast  Result Date: 01/05/2022 CLINICAL DATA:  Neuro deficit, acute, stroke suspected x3 days Fall 3 days ago. EXAM: CT HEAD WITHOUT CONTRAST TECHNIQUE: Contiguous axial images were obtained from the base of the skull through the vertex without intravenous contrast. RADIATION DOSE REDUCTION: This exam was performed according to the departmental dose-optimization program which includes automated exposure control, adjustment of the mA and/or kV according to patient size and/or use of iterative reconstruction technique. COMPARISON:  Head CT 3 days ago 01/02/2022 FINDINGS: Brain: No acute intracranial hemorrhage. No subdural or extra-axial collection. Stable degree of atrophy. Extensive periventricular and deep white matter hypodensity typical of chronic small vessel ischemia. No interval change. There is no evidence of superimposed acute infarct, although slightly limited due  to the degree of background change.  Small remote lacunar infarcts in the thalamus were also seen on prior. Vascular: Atherosclerosis of skullbase vasculature without hyperdense vessel or abnormal calcification. Skull: No fracture or focal lesion. Sinuses/Orbits: Paranasal sinuses are clear. Slight opacification of lower left mastoid air cells, unchanged. The visualized orbits are unremarkable. Mild soft tissue edema in the right face. Other: None. IMPRESSION: 1. No acute intracranial abnormality. 2. Stable atrophy and chronic small vessel ischemia. If there is persistent clinical concern for ischemia, recommend brain MRI, as the degree of background chronic change could obscure subtle findings. Electronically Signed   By: Keith Rake M.D.   On: 01/05/2022 19:46    Pending Labs Unresulted Labs (From admission, onward)     Start     Ordered   01/06/22 0500  CBC with Differential/Platelet  Tomorrow morning,   R        01/06/22 0125   01/06/22 0500  Comprehensive metabolic panel  Tomorrow morning,   R        01/06/22 0125   01/06/22 0500  Magnesium  Tomorrow morning,   R        01/06/22 0125   01/05/22 2217  Urine Culture  Once,   URGENT       Question:  Indication  Answer:  Dysuria   01/05/22 2216            Vitals/Pain Today's Vitals   01/05/22 2030 01/05/22 2100 01/05/22 2246 01/06/22 0042  BP: (!) 164/90 (!) 175/86 (!) 172/86 (!) 172/97  Pulse: 65 70 (!) 52 76  Resp: '14 15 16 16  '$ Temp:   98.2 F (36.8 C)   TempSrc:   Oral   SpO2: 96% 99% 96% 99%  PainSc:        Isolation Precautions No active isolations  Medications Medications  acetaminophen (TYLENOL) tablet 650 mg (has no administration in time range)    Or  acetaminophen (TYLENOL) suppository 650 mg (has no administration in time range)  sodium chloride 0.9 % bolus 1,000 mL (1,000 mLs Intravenous Bolus 01/05/22 1947)  iohexol (OMNIPAQUE) 300 MG/ML solution 100 mL (100 mLs Intravenous Contrast Given 01/05/22 2117)  cefTRIAXone (ROCEPHIN) 1 g in  sodium chloride 0.9 % 100 mL IVPB (0 g Intravenous Stopped 01/06/22 0122)  sodium chloride 0.9 % bolus 1,000 mL (1,000 mLs Intravenous Bolus 01/05/22 2304)  iohexol (OMNIPAQUE) 350 MG/ML injection 75 mL (75 mLs Intravenous Contrast Given 01/05/22 2305)  ketorolac (TORADOL) 15 MG/ML injection 15 mg (15 mg Intravenous Given 01/06/22 0154)  melatonin tablet 5 mg (5 mg Oral Given 01/06/22 0155)    Mobility non-ambulatory High fall risk   Focused Assessments    R Recommendations: See Admitting Provider Note  Report given to:   Additional Notes:

## 2022-01-06 NOTE — Progress Notes (Signed)
  Carryover admission to the Day Admitter.  I discussed this case with the EDP, Dr. Laverta Baltimore.  Per these discussions:   This is a 71 year old female with history of ischemic CVA with residual right hemiparesis, history of subdural hematoma, who is being admitted for further evaluation management acute encephalopathy, after presenting with 3 to 4 days of progressive confusion.  Patient reportedly lives with her daughter, who is noted the patient become more confused over the last 3 to 4 days.  At the onset of this confusion, she reportedly experienced a ground-level mechanical fall on 01/02/2022, prompting evaluation at [facility] emergency department, where imaging showed no evidence of acute process, and the patient was reportedly then discharged home from the ED.  However, in the setting of further progression of the patient's condition, she presents to [facility] long ED this evening for further evaluation thereof.  No fevers.  She underwent preliminary CT head which showed no acute process, followed by MRI brain which also showed no evidence of acute process, including no evidence of acute infarct.  Upon reevaluation EDP found the patient laying on the floor for room, which prompted a repeat CT scan due to concern that she may fall and arrived in that position.  Repeat CT scan showed no interval change, bone scan demonstrated evidence of acute intracranial process.   Per my discussions with the EDP, urinalysis was questionable for UTI, prompting initiation of Rocephin.   I have placed an order for for inpatient admission for further evaluation management of acute encephalopathy, unclear source.  I have placed some additional preliminary admit orders via the adult multi-morbid admission order set. I have also ordered n.p.o. for now.  I will defer to the admitting hospitalist regarding potential continuation of Rocephin for possible UTI.  Fall precautions ordered.  I have also ordered morning labs in the form  of CMP, CBC with differential, and serum magnesium level.    Babs Bertin, DO Hospitalist

## 2022-01-07 DIAGNOSIS — G9340 Encephalopathy, unspecified: Secondary | ICD-10-CM | POA: Diagnosis not present

## 2022-01-07 LAB — GLUCOSE, CAPILLARY
Glucose-Capillary: 137 mg/dL — ABNORMAL HIGH (ref 70–99)
Glucose-Capillary: 155 mg/dL — ABNORMAL HIGH (ref 70–99)
Glucose-Capillary: 160 mg/dL — ABNORMAL HIGH (ref 70–99)
Glucose-Capillary: 132 mg/dL — ABNORMAL HIGH (ref 70–99)

## 2022-01-07 LAB — BASIC METABOLIC PANEL
Anion gap: 16 — ABNORMAL HIGH (ref 5–15)
Anion gap: 12 (ref 5–15)
BUN: 19 mg/dL (ref 8–23)
BUN: 20 mg/dL (ref 8–23)
CO2: 18 mmol/L — ABNORMAL LOW (ref 22–32)
CO2: 22 mmol/L (ref 22–32)
Calcium: 8.7 mg/dL — ABNORMAL LOW (ref 8.9–10.3)
Calcium: 8.6 mg/dL — ABNORMAL LOW (ref 8.9–10.3)
Chloride: 107 mmol/L (ref 98–111)
Chloride: 109 mmol/L (ref 98–111)
Creatinine, Ser: 0.5 mg/dL (ref 0.44–1.00)
Creatinine, Ser: 0.7 mg/dL (ref 0.44–1.00)
GFR, Estimated: >60 mL/min (ref >60)
GFR, Estimated: >60 mL/min (ref >60)
Glucose, Bld: 125 mg/dL — ABNORMAL HIGH (ref 70–99)
Glucose, Bld: 144 mg/dL — ABNORMAL HIGH (ref 70–99)
Potassium: 3.4 mmol/L — ABNORMAL LOW (ref 3.5–5.1)
Potassium: 4 mmol/L (ref 3.5–5.1)
Sodium: 141 mmol/L (ref 135–145)
Sodium: 143 mmol/L (ref 135–145)

## 2022-01-07 LAB — URINE CULTURE: Culture: NO GROWTH

## 2022-01-07 LAB — VITAMIN B12: Vitamin B-12: 845 pg/mL (ref 180–914)

## 2022-01-07 LAB — T4, FREE: Free T4: 1.19 ng/dL — ABNORMAL HIGH (ref 0.61–1.12)

## 2022-01-07 LAB — FOLATE: Folate: 28.2 ng/mL (ref >5.9)

## 2022-01-07 LAB — TSH: TSH: 4.167 u[IU]/mL (ref 0.350–4.500)

## 2022-01-07 LAB — MAGNESIUM: Magnesium: 2.2 mg/dL (ref 1.7–2.4)

## 2022-01-07 MED ORDER — INSULIN ASPART 100 UNIT/ML IJ SOLN
0.0000 [IU] | INTRAMUSCULAR | Status: DC
  Administered 2022-01-07 (×2): 2 [IU] via SUBCUTANEOUS
  Administered 2022-01-07: 1 [IU] via SUBCUTANEOUS
  Administered 2022-01-08: 2 [IU] via SUBCUTANEOUS
  Administered 2022-01-08: 1 [IU] via SUBCUTANEOUS
  Administered 2022-01-09: 3 [IU] via SUBCUTANEOUS

## 2022-01-07 MED ORDER — LACTATED RINGERS IV BOLUS
500.0000 mL | Freq: Once | INTRAVENOUS | Status: AC
  Administered 2022-01-07: 500 mL via INTRAVENOUS

## 2022-01-07 MED ORDER — LACTATED RINGERS IV SOLN: INTRAVENOUS | Status: DC

## 2022-01-07 MED ORDER — POTASSIUM CHLORIDE CRYS ER 20 MEQ PO TBCR
40.0000 meq | EXTENDED_RELEASE_TABLET | Freq: Once | ORAL | Status: AC
  Administered 2022-01-07: 40 meq via ORAL
  Filled 2022-01-07: qty 2

## 2022-01-07 MED ORDER — HYDRALAZINE HCL 20 MG/ML IJ SOLN: 10.0000 mg | INTRAMUSCULAR | Status: DC | PRN

## 2022-01-07 NOTE — Progress Notes (Signed)
  Transition of Care [facility]) Screening Note   Patient Details  Name: SUMIE REMSEN Date of Birth: 06-12-1950   Transition of Care [name]) CM/SW Contact:    Kimber Relic, LCSW Phone Number: 01/07/2022, 1:21 PM    Transition of Care Department [facility]) has reviewed patient and no TOC needs have been identified at this time. We will continue to monitor patient advancement through interdisciplinary progression rounds. If new patient transition needs arise, please place a TOC consult.

## 2022-01-07 NOTE — Progress Notes (Signed)
[facility] Progress Note Patient: Melanie Lucas QQV:[id number] DOB: 08/09/1950 DOA: 01/05/2022  DOS: the patient was seen and examined on 01/07/2022  Brief hospital course:  Melanie Lucas is a 71 y.o. female with medical history significant of anxiety, depression, chronic diastolic CHF, chronic acquired lymphedema, COPD, CAD, right ear deafness, type 2 diabetes, diabetic peripheral neuropathy, elevated TSH, history of hematuria, history of nephrolithiasis, hyperlipidemia, hypertension, incontinence, left carotid stenosis Mnire disease, history of obesity now with a normal weight, osteoarthritis, history of TIA x2, tobacco abuse, paroxysmal atrial fibrillation on Tikosyn/apixaban who is brought to the emergency department due to altered mental status.  Currently mentation improving with treatment of IV fluids and IV antibiotic. Assessment and Plan: Acute metabolic encephalopathy Acute cystitis  Continue ceftriaxone 1 g IVPB daily. Follow urine culture and sensitivity. Follow-up blood culture and sensitivity. Supportive care. Low-dose haloperidol as needed for restlessness.   Chronic atrial fibrillation (HCC) CHA2DS2-VASc Score of 8. Continue Tikosyn 200 mg p.o. twice daily.  Continue apixaban 5 mg p.o. twice daily.     Chronic diastolic CHF (congestive heart failure) (HCC) No signs of decompensation. Continue ARB and diuretic.     Essential hypertension, benign Continue losartan 50 mg p.o. daily.   Continue furosemide 20 mg p.o. daily.     Coronary artery disease involving native  coronary artery of native heart without angina pectoris On apixaban and losartan.     Pulmonary hypertension (HCC) No signs of decompensation at this time.   Type 2 diabetes mellitus without complication,  without long-term current use of insulin (HCC) Anion gap acidosis. Carbohydrate modified diet. Hold long-acting insulin.  Hold Jardiance in the setting of anion gap acidosis.     COPD (chronic  obstructive pulmonary disease) ([place]) Supplemental oxygen as needed. Bronchodilators as needed.     Depression Continue duloxetine 30 mg p.o. bedtime.     Memory loss Continue depression treatment. Supportive care.   Subjective: No nausea no vomiting no fever no chills.  Unable to follow commands consistently.  Denies any acute complaint.  Physical Exam: General: in mild distress;  Cardiovascular: S1 and S2 Present, no Murmur Respiratory: normal respiratory effort, Bilateral Air entry present, no Crackles, no wheezes Abdomen: Bowel Sound present, Non tender  Extremities: No edema. Neurology: alert and not oriented to time, place, and person   Data Reviewed: I have Reviewed nursing notes, Vitals, and Lab results. Since last encounter, pertinent lab results CBC and BMP   . I have ordered test including CBC and BMP  .   Disposition: Status is: Inpatient Remains inpatient appropriate because: Need for antibiotic  SCDs Start: 01/06/22 0123 apixaban (ELIQUIS) tablet 5 mg   Family Communication: No one at bedside Level of care: Telemetry continue telemetry due to encephalopathy. Vitals:   01/06/22 2055 01/07/22 0425 01/07/22 1339 01/07/22 1941  BP: (!) 157/83 (!) 149/101 (!) 142/67 (!) 168/88  Pulse: 76 82 83 76  Resp: '18 18 20 17  '$ Temp: 98.5 F (36.9 C) 97.9 F (36.6 C) 98.8 F (37.1 C) (!) 97.5 F (36.4 C)  TempSrc: Oral Oral Oral Oral  SpO2: 95% 94% 99% 94%  Weight:      Height:         Author: Berle Mull, MD 01/07/2022 8:11 PM  Please look on [email or web address] to find out who is on call.

## 2022-01-07 NOTE — Evaluation (Signed)
Clinical/Bedside Swallow Evaluation Patient Details  Name: Melanie Lucas MRN: [id number] Date of Birth: 16-Jul-1950  Today's Date: 01/07/2022 Time: SLP Start Time (ACUTE ONLY): 4627 SLP Stop Time (ACUTE ONLY): 1635 SLP Time Calculation (min) (ACUTE ONLY): 15 min  Past Medical History:  Past Medical History:  Diagnosis Date   Anxiety    Cancer ([place])    Skin- leg    Chronic acquired lymphedema    a. R>L;  b. 03/2012 Neg LE U/S for DVT. Legs since age 71   Complication of anesthesia    woke up during colonoscopy, Lithrostrixpy, Biospy   COPD (chronic obstructive pulmonary disease) ([place])    Coronary artery disease    Deaf, right    Diabetes mellitus    Type II   Diabetic neuropathy ([place])    Elevated TSH    a. 10/2012 - inst to f/u PCP.   Full dentures    Hematuria    a. while on pradaxa,  she reports that she has seen Dr Margie Ege and had low risk cystoscopy   History of kidney stones    Hyperlipidemia    Hypertension    patient denies   Incontinence    prior to urinating   Lacunar infarction [facility])    a. 02/2009 non-acute Lacunar infarct of the right thalamus noted on MRI of brain.   Left carotid stenosis    Lymphedema of extremity    had this problem, since she was a teenager. especially seen in R LE   Meniere disease    Myocardial infarction [facility])    Obesity    Osteoarthritis    cervical & lumbar region, knees, hands cramp also    PAF with post-termination pauses    a. on dronedarone;  b. CHA2DS2VASc = 5 (HTN, DM, h/o lacunar infarct on MRI, Female) ->refused oral anticoagulation after h/o hematuria on pradaxa;  c. 02/2012 Echo: EF 55-60%, mildly dil LA. D. Recurrent PAF 10/2012 after only taking Multaq 1x/day - spont conv to NSR, placed back on BID Multaq/eliquis;  e. 10/2014 Multaq d/c'd->tikosyn initiated.   Shortness of breath dyspnea    with exertion   Stroke ([place])    2 mini strokes   Tobacco abuse    Vision abnormalities    Past Surgical History:  Past Surgical History:   Procedure Laterality Date   ANTERIOR CERVICAL CORPECTOMY N/A 02/21/2016   Procedure: ANTERIOR CERVICAL CORPECTOMY ANS FUSION CERVICAL SIX , ANTERIOR PLATING CERVICAL FIVE-SEVEN;  Surgeon: Melanie Lose, MD;  Location: [place];  Service: Neurosurgery;  Laterality: N/A;   ANTERIOR FUSION CERVICAL SPINE  02/21/2016   Biopsy of adrenal glands     BREAST BIOPSY Bilateral    benign results, both breasts    CARDIAC CATHETERIZATION  2014   x1 stent placed, done in [place]    CARDIOVERSION N/A 11/11/2014   Procedure: CARDIOVERSION;  Surgeon: Melanie Dresser, MD;  Location: [place];  Service: Cardiovascular;  Laterality: N/A;   CAROTID ENDARTERECTOMY Left 07/29/2017   COLONOSCOPY W/ POLYPECTOMY     CORONARY STENT PLACEMENT     ENDARTERECTOMY Left 07/29/2017   Procedure: ENDARTERECTOMY CAROTID LEFT;  Surgeon: Melanie Posner, MD;  Location: [place];  Service: Vascular;  Laterality: Left;   PATCH ANGIOPLASTY Left 07/29/2017   Procedure: PATCH ANGIOPLASTY USING HEMASHIELD GOLD VASCULAR PATCH;  Surgeon: Melanie Posner, MD;  Location: [place];  Service: Vascular;  Laterality: Left;   TEE WITHOUT CARDIOVERSION N/A 11/11/2014   Procedure: TRANSESOPHAGEAL ECHOCARDIOGRAM (TEE);  Surgeon:  Melanie Dresser, MD;  Location: [facility] ENDOSCOPY;  Service: Cardiovascular;  Laterality: N/A;   HPI:  Patient is a 71 y.o. female with PMH: anxiety, depression, CHF, COPD, right ear deafness, DM-2, HTN, HLD, incontinence, left carotid stenosis, h/o CVA's with residual dysarthria, memory issues, balance issues, cognitive impairment (score of 10 out of 30 on SLUMS  07/27/21). She presented to the hospital on 01/05/2022 due to AMS. Patient had fall four days prior to this admission and was discharged from the hospital in stable condition. Daughter reported that since the fall, she has had difficulty talking, following commands, walking and tolerating oral intake. In ED, she was afebrile, RR 18 and saturating at 99% on RA. She was admitted  with acute metabolic encephalopathy in the setting of acute cystitis.    Assessment / Plan / Recommendation  Clinical Impression  Patient presents with what appears to be a cogntiive-based dysphagia as per this bedside swallow evaluation. Oral preparatory phase and oral phase were mildly prolonged but swallow initiation appeared timely and no overt s/s aspiration or penetration of liquids or gelatin solids. Patient is edentulous and she did exhibited oral delays with small bites of gelatin but no oral residuals observed s/p initial swallows. SLP is recommending to contiue with current prescribed diet of Dys 3 (mechanical soft) solids and thin liquids and plan to f/u next 1-2 dates to ensure diet toleration. SLP Visit Diagnosis: Dysphagia, unspecified (R13.10)    Aspiration Risk  Mild aspiration risk    Diet Recommendation Dysphagia 3 (Mech soft);Thin liquid   Liquid Administration via: Straw;Cup Medication Administration: Crushed with puree Supervision: Full supervision/cueing for compensatory strategies;Staff to assist with self feeding Compensations: Slow rate;Small sips/bites;Minimize environmental distractions Postural Changes: Seated upright at 90 degrees    Other  Recommendations Oral Care Recommendations: Oral care BID;Staff/trained caregiver to provide oral care    Recommendations for follow up therapy are one component of a multi-disciplinary discharge planning process, led by the attending physician.  Recommendations may be updated based on patient status, additional functional criteria and insurance authorization.  Follow up Recommendations Follow physician's recommendations for discharge plan and follow up therapies      Assistance Recommended at Discharge    Functional Status Assessment Patient has had a recent decline in their functional status and demonstrates the ability to make significant improvements in function in a reasonable and predictable amount of time.  Frequency  and Duration min 2x/week  1 week       Prognosis Prognosis for Safe Diet Advancement: Fair Barriers to Reach Goals: Cognitive deficits;Severity of deficits;Time post onset      Swallow Study   General Date of Onset: 01/05/22 HPI: Patient is a 71 y.o. female with PMH: anxiety, depression, CHF, COPD, right ear deafness, DM-2, HTN, HLD, incontinence, left carotid stenosis, h/o CVA's with residual dysarthria, memory issues, balance issues, cognitive impairment (score of 10 out of 30 on SLUMS  07/27/21). She presented to the hospital on 01/05/2022 due to AMS. Patient had fall four days prior to this admission and was discharged from the hospital in stable condition. Daughter reported that since the fall, she has had difficulty talking, following commands, walking and tolerating oral intake. In ED, she was afebrile, RR 18 and saturating at 99% on RA. She was admitted with acute metabolic encephalopathy in the setting of acute cystitis. Type of Study: Bedside Swallow Evaluation Previous Swallow Assessment: none found Diet Prior to this Study: Dysphagia 3 (soft);Thin liquids Temperature Spikes Noted: No Respiratory Status: Room  air History of Recent Intubation: No Behavior/Cognition: Alert;Cooperative;Requires cueing;Confused Oral Cavity Assessment: Within Functional Limits Oral Care Completed by SLP: No Oral Cavity - Dentition: Edentulous Self-Feeding Abilities: Total assist Patient Positioning: Upright in bed Baseline Vocal Quality: Low vocal intensity Volitional Cough: Cognitively unable to elicit Volitional Swallow: Unable to elicit    Oral/Motor/Sensory Function Overall Oral Motor/Sensory Function: Other (comment) (mild-moderate generalized oral motor weakness)   Ice Chips     Thin Liquid Thin Liquid: Impaired Presentation: Straw Pharyngeal  Phase Impairments: Suspected delayed Swallow Other Comments: delayed initiation of PO intake but able to drink liquids via straw sips when SLP held  to lips    Nectar Thick     Honey Thick     Puree Puree: Not tested   Solid     Solid: Impaired Oral Phase Impairments: Impaired mastication Oral Phase Functional Implications: Impaired mastication;Prolonged oral transit Other Comments: tested with small bites of gelatin     Sonia Baller, MA, CCC-SLP Speech Therapy

## 2022-01-07 NOTE — Hospital Course (Signed)
Melanie Lucas is a 71 y.o. female with medical history significant of anxiety, depression, chronic diastolic CHF, chronic acquired lymphedema, COPD, CAD, right ear deafness, type 2 diabetes, diabetic peripheral neuropathy, elevated TSH, history of hematuria, history of nephrolithiasis, hyperlipidemia, hypertension, incontinence, left carotid stenosis Mnire disease, history of obesity now with a normal weight, osteoarthritis, history of TIA x2, tobacco abuse, paroxysmal atrial fibrillation on Tikosyn/apixaban who is brought to the emergency department due to altered mental status.  Currently mentation improving with treatment of IV fluids and IV antibiotic.

## 2022-01-08 ENCOUNTER — Inpatient Hospital Stay (HOSPITAL_COMMUNITY)
Admit: 2022-01-08 | Discharge: 2022-01-08 | Disposition: A | Discharge status: Home / Self Care | Payer: Medicare PPO | Attending: Internal Medicine | Admitting: Internal Medicine

## 2022-01-08 DIAGNOSIS — G9340 Encephalopathy, unspecified: Secondary | ICD-10-CM | POA: Diagnosis not present

## 2022-01-08 DIAGNOSIS — R4182 Altered mental status, unspecified: Secondary | ICD-10-CM | POA: Diagnosis not present

## 2022-01-08 LAB — BASIC METABOLIC PANEL
Anion gap: 13 (ref 5–15)
BUN: 20 mg/dL (ref 8–23)
CO2: 23 mmol/L (ref 22–32)
Calcium: 8.6 mg/dL — ABNORMAL LOW (ref 8.9–10.3)
Chloride: 107 mmol/L (ref 98–111)
Creatinine, Ser: 0.48 mg/dL (ref 0.44–1.00)
GFR, Estimated: >60 mL/min (ref >60)
Glucose, Bld: 99 mg/dL (ref 70–99)
Potassium: 4.4 mmol/L (ref 3.5–5.1)
Sodium: 143 mmol/L (ref 135–145)

## 2022-01-08 LAB — GLUCOSE, CAPILLARY
Glucose-Capillary: 117 mg/dL — ABNORMAL HIGH (ref 70–99)
Glucose-Capillary: 126 mg/dL — ABNORMAL HIGH (ref 70–99)
Glucose-Capillary: 128 mg/dL — ABNORMAL HIGH (ref 70–99)
Glucose-Capillary: 147 mg/dL — ABNORMAL HIGH (ref 70–99)
Glucose-Capillary: 151 mg/dL — ABNORMAL HIGH (ref 70–99)
Glucose-Capillary: 86 mg/dL (ref 70–99)
Glucose-Capillary: 96 mg/dL (ref 70–99)

## 2022-01-08 LAB — HEMOGLOBIN A1C
Hgb A1c MFr Bld: 7 % — ABNORMAL HIGH (ref 4.8–5.6)
Mean Plasma Glucose: 154.2 mg/dL

## 2022-01-08 NOTE — Progress Notes (Signed)
EEG complete - results pending 

## 2022-01-08 NOTE — Progress Notes (Signed)
Speech Language Pathology Treatment: Dysphagia  Patient Details Name: Melanie Lucas MRN: [id number] DOB: 18-Apr-1950 Today's Date: 01/08/2022 Time: 2831-5176 SLP Time Calculation (min) (ACUTE ONLY): 15 min  Assessment / Plan / Recommendation Clinical Impression  Patient seen by SLP for skilled treatment session focused on dysphagia goals. Patient was awake but continues to be lethargic and will quickly close eyes and fall asleep if not being interacted with. SLP performed oral care with patient's mouth appearing slightly dry but otherwise clear/clean. SLP fed patient the following consistencies from her breakfast meal tray: thin liquids (juice), puree solids (grits, applesauce) and soft solids (small piece of sausage). She tolerated thin liquids via straw sips without observed difficulty, with timely swallow and no coughing or throat clearing. She exhibited delays in oral transit with puree solids but with eventual full clearance. With soft solids, she exhibited prolonged mastication and oral transit but again, no residuals observed post swallows. SLP recommending downgrade diet to Dys 2(minced) solids from Dys 3 (mechanical soft); will continue to follow patient for diet toleration.    HPI HPI: Patient is a 71 y.o. female with PMH: anxiety, depression, CHF, COPD, right ear deafness, DM-2, HTN, HLD, incontinence, left carotid stenosis, h/o CVA's with residual dysarthria, memory issues, balance issues, cognitive impairment (score of 10 out of 30 on SLUMS  07/27/21). She presented to the hospital on 01/05/2022 due to AMS. Patient had fall four days prior to this admission and was discharged from the hospital in stable condition. Daughter reported that since the fall, she has had difficulty talking, following commands, walking and tolerating oral intake. In ED, she was afebrile, RR 18 and saturating at 99% on RA. She was admitted with acute metabolic encephalopathy in the setting of acute cystitis.      SLP  Plan  Continue with current plan of care      Recommendations for follow up therapy are one component of a multi-disciplinary discharge planning process, led by the attending physician.  Recommendations may be updated based on patient status, additional functional criteria and insurance authorization.    Recommendations  Diet recommendations: Dysphagia 2 (fine chop);Thin liquid Liquids provided via: Straw;Cup Medication Administration: Crushed with puree Supervision: Full supervision/cueing for compensatory strategies;Trained caregiver to feed patient;Staff to assist with self feeding Compensations: Small sips/bites;Slow rate;Minimize environmental distractions;Lingual sweep for clearance of pocketing Postural Changes and/or Swallow Maneuvers: Seated upright 90 degrees                Oral Care Recommendations: Oral care BID;Staff/trained caregiver to provide oral care;Oral care before and after PO Follow Up Recommendations: Follow physician's recommendations for discharge plan and follow up therapies Assistance recommended at discharge: Frequent or constant Supervision/Assistance SLP Visit Diagnosis: Dysphagia, unspecified (R13.10) Plan: Continue with current plan of care           Melanie Baller, MA, CCC-SLP Speech Therapy

## 2022-01-08 NOTE — Procedures (Signed)
Patient Name: Melanie Lucas  MRN: [id number]  Epilepsy Attending: Lora Havens  Referring Physician/Provider: Lavina Hamman, MD  Date: 01/08/2022 Duration: 22.39 mins  Patient history: 71yo F with ams. EEG to evaluate for seizure  Level of alertness: Awake  AEDs during EEG study: None  Technical aspects: This EEG study was done with scalp electrodes positioned according to the 10-20 International system of electrode placement. Electrical activity was reviewed with band pass filter of 1-'70Hz'$ , sensitivity of 7 uV/mm, display speed of 8m/sec with a '60Hz'$  notched filter applied as appropriate. EEG data were recorded continuously and digitally stored.  Video monitoring was available and reviewed as appropriate.  Description: The posterior dominant rhythm consists of 8 Hz activity of moderate voltage (25-35 uV) seen predominantly in posterior head regions, symmetric and reactive to eye opening and eye closing. EEG showed intermittent generalized 3 to 6 Hz theta-delta slowing. Hyperventilation and photic stimulation were not performed.     ABNORMALITY - Intermittent slow, generalized  IMPRESSION: This study is suggestive of mild to moderate diffuse encephalopathy, nonspecific etiology. No seizures or epileptiform discharges were seen throughout the recording.  Muriah Harsha [name]

## 2022-01-08 NOTE — Progress Notes (Signed)
[facility] Progress Note Patient: Melanie Lucas FFM:[id number] DOB: [birth year] DOA: 01/05/2022  DOS: the patient was seen and examined on 01/08/2022  Brief hospital course: 71 year old female with PMH of depression, anxiety, COPD, CAD, Mnire's disease with right ear deafness, type II DM, neuropathy, left carotid stenosis, HLD, osteoporosis, paroxysmal A-fib on Tikosyn/Eliquis, former smoker presented to the ER with complaints of confusion. 10/14 visited ER for cystitis and near syncope. 11/14 visited ER for fall.  CT head and C-spine were unremarkable. 11/17 brought back to ER again secondary to poor p.o. intake, minimal interaction and excessive sleepiness and confusion.  As well as chest pain. Thought to be having concussion syndrome. Treated with IV ceftriaxone for 3-day for UTI. Assessment and Plan: Acute encephalopathy/concussion Presents with confusion which is progressively worsening after her recent fall on 11/14. At her baseline the patient walks with a walker, feed herself, requires assistance for bathing, has slow speech with some dysarthria/expressive aphasia. MRI brain negative for any acute stroke. CT head 11/18, 11/17, 11/14 negative for any acute intracranial pathology. EEG shows encephalopathy without any acute abnormality. B12 845.  TSH 4.1, free T41.19.  Folate 28.2 ammonia 17.  COVID-negative.  VBG showed pH of 7.4 PCO2 40. There are some concern for cystitis but urine culture is negative. Mentation per family still not back to baseline. Currently suspect concussion as a potential etiology. Avoiding psychotropic medications. If symptoms does not improve may consider Aricept.  Concern for acute UTI. Recurrent UTI history. Patient treated for 3 days of ceftriaxone. We will resume Macrobid.  Chronic A-fib. Patient is on Tikosyn and Eliquis. Maintain potassium more than 4. Continue Eliquis 5 mg twice daily. Multiple CT scan negative for any acute  bleeding.  Type 2 diabetes mellitus, uncontrolled with hyperglycemia with history of diabetic neuropathy and long-term insulin use. Due to metabolic acidosis Jardiance is on hold. Hold metformin. Patient is on Lantus 8 units currently on hold due to poor p.o. intake. Continue sliding scale insulin every 4 hours. Monitor.  History of chronic diastolic CHF. HTN Volume status adequate right now rather on dry side. Receiving IV hydration.  For now. Continue losartan. Hold Lasix and potassium.  Osteoporosis. Recently received Reclast prior to admission.  Insomnia. Family mention that the patient used to take 5 mg of melatonin nightly. 3 days before a recent fall switch to natures bounty sleep aid with L-theanine 200 mg, melatonin 10 mg and herbal blend. Has not received that medicine here in the hospital. Monitor.  Mood disorder. Patient is on Cymbalta 30 mg daily. Currently on hold.  Vitamin D deficiency. On vitamin supplementation.  Currently on hold.  Overactive bladder. Pression is on Myrbetriq. Currently on hold.  Will resume likely tomorrow as long as mentation improves.  Dysphagia. Speech therapy following the patient. Dysphagia most likely secondary to generalized fatigue and encephalopathy. Currently on dysphagia 2 diet. Monitor. MRI brain negative for any acute stroke.  Mnire's disease. Monitor.  History of COPD. Intermittent cough. Monitor. Does not appear to have any evidence of exacerbation right now.  CAD. Per daughter she was also brought to the hospital for complaints of 'heart' symptoms. EKG in ER showed RBBB and LAFB without any significant ST-T wave abnormalities consistent with ACS. Troponin x2 22 and 23.  Abnormal but not consistent with ACS pattern. Patient has not had any complaints of chest pain. Telemetry was negative for any significant events. For now monitor.  12 mm right kidney cyst. Unchanged in size compared to 2021 exam. Renal  MRI protocol outpatient recommended.  Scoliosis. Seen on the CT. Monitor.  Remote fractures of left superior and inferior pubic rami seen.   Subjective: Mentation remains altered.  Intermittently following commands.  Speech slow.  Able to identify family.  Compared to yesterday the mentation appears to be better.  Physical Exam: General: in mild distress;  Cardiovascular: S1 and S2 Present, no Murmur Respiratory: normal respiratory effort, Bilateral Air entry present, no Crackles, no wheezes Abdomen: Bowel Sound present, Non tender Extremities: no edema Neurology: alert and did not speak to me but was speaking with the daughter.  Chronically sluggish speech.  Data Reviewed: I have Reviewed nursing notes, Vitals, and Lab results. Since last encounter, pertinent lab results CBC and BMP   . I have ordered test including CBC and BMP  .  Disposition: Status is: Inpatient Remains inpatient appropriate because: Need to ensure oral intake is adequate.  SCDs Start: 01/06/22 0123 apixaban (ELIQUIS) tablet 5 mg   Family Communication: Daughter at bedside as well as grandson Level of care: Telemetry   Vitals:   01/07/22 1941 01/08/22 0505 01/08/22 0608 01/08/22 1343  BP: (!) 168/88 (!) 152/90  (!) 142/86  Pulse: 76 69  97  Resp: '17 18  16  '$ Temp: (!) 97.5 F (36.4 C) 97.9 F (36.6 C)  98.3 F (36.8 C)  TempSrc: Oral     SpO2: 94% 97%  100%  Weight:   62.9 kg   Height:         Author: Berle Mull, MD 01/08/2022 6:30 PM  Please look on [email or web address] to find out who is on call.

## 2022-01-09 DIAGNOSIS — G9340 Encephalopathy, unspecified: Secondary | ICD-10-CM | POA: Diagnosis not present

## 2022-01-09 LAB — BASIC METABOLIC PANEL
Anion gap: 9 (ref 5–15)
BUN: 19 mg/dL (ref 8–23)
CO2: 23 mmol/L (ref 22–32)
Calcium: 8.6 mg/dL — ABNORMAL LOW (ref 8.9–10.3)
Chloride: 107 mmol/L (ref 98–111)
Creatinine, Ser: 0.53 mg/dL (ref 0.44–1.00)
GFR, Estimated: >60 mL/min (ref >60)
Glucose, Bld: 114 mg/dL — ABNORMAL HIGH (ref 70–99)
Potassium: 3.6 mmol/L (ref 3.5–5.1)
Sodium: 139 mmol/L (ref 135–145)

## 2022-01-09 LAB — GLUCOSE, CAPILLARY
Glucose-Capillary: 225 mg/dL — ABNORMAL HIGH (ref 70–99)
Glucose-Capillary: 94 mg/dL (ref 70–99)
Glucose-Capillary: 139 mg/dL — ABNORMAL HIGH (ref 70–99)
Glucose-Capillary: 140 mg/dL — ABNORMAL HIGH (ref 70–99)
Glucose-Capillary: 184 mg/dL — ABNORMAL HIGH (ref 70–99)

## 2022-01-09 MED ORDER — ORAL CARE MOUTH RINSE
15.0000 mL | OROMUCOSAL | Status: DC
  Administered 2022-01-10 – 2022-01-17 (×29): 15 mL via OROMUCOSAL

## 2022-01-09 MED ORDER — MIRABEGRON ER 25 MG PO TB24
50.0000 mg | ORAL_TABLET | Freq: Every day | ORAL | Status: DC
  Administered 2022-01-12 – 2022-01-16 (×5): 50 mg via ORAL
  Filled 2022-01-09 (×7): qty 2

## 2022-01-09 MED ORDER — INSULIN ASPART 100 UNIT/ML IJ SOLN
0.0000 [IU] | Freq: Three times a day (TID) | INTRAMUSCULAR | Status: DC
  Administered 2022-01-09: 1 [IU] via SUBCUTANEOUS
  Administered 2022-01-10: 2 [IU] via SUBCUTANEOUS
  Administered 2022-01-10: 1 [IU] via SUBCUTANEOUS

## 2022-01-09 MED ORDER — ENSURE ENLIVE PO LIQD
237.0000 mL | Freq: Three times a day (TID) | ORAL | Status: DC
  Administered 2022-01-09 – 2022-01-17 (×21): 237 mL via ORAL

## 2022-01-09 MED ORDER — INSULIN ASPART 100 UNIT/ML IJ SOLN: 0.0000 [IU] | Freq: Every day | INTRAMUSCULAR | Status: DC

## 2022-01-09 MED ORDER — POTASSIUM CHLORIDE CRYS ER 20 MEQ PO TBCR
40.0000 meq | EXTENDED_RELEASE_TABLET | Freq: Once | ORAL | Status: AC
  Administered 2022-01-09: 40 meq via ORAL
  Filled 2022-01-09: qty 2

## 2022-01-09 MED ORDER — ADULT MULTIVITAMIN W/MINERALS CH
1.0000 | ORAL_TABLET | Freq: Every day | ORAL | Status: DC
  Administered 2022-01-09 – 2022-01-17 (×9): 1 via ORAL
  Filled 2022-01-09 (×9): qty 1

## 2022-01-09 MED ORDER — ORAL CARE MOUTH RINSE: 15.0000 mL | OROMUCOSAL | Status: DC | PRN

## 2022-01-09 NOTE — Progress Notes (Signed)
[facility] Progress Note Patient: Melanie Lucas:[id number] DOB: Feb 23, 1950 DOA: 01/05/2022  DOS: the patient was seen and examined on 01/09/2022  Brief hospital course: 71 year old female with PMH of depression, anxiety, COPD, CAD, Mnire's disease with right ear deafness, type II DM, neuropathy, left carotid stenosis, HLD, osteoporosis, paroxysmal A-fib on Tikosyn/Eliquis, former smoker presented to the ER with complaints of confusion. 10/14 visited ER for cystitis and near syncope. 11/14 visited ER for fall.  CT head and C-spine were unremarkable. 11/17 brought back to ER again secondary to poor p.o. intake, minimal interaction and excessive sleepiness and confusion.  As well as chest pain. Thought to be having concussion syndrome. Treated with IV ceftriaxone for 3-day for UTI.  Assessment and Plan: Acute encephalopathy/concussion Presents with confusion which is progressively worsening after her recent fall on 11/14. At her baseline the patient walks with a walker, feed herself, requires assistance for bathing, has slow speech with some dysarthria/expressive aphasia. MRI brain negative for any acute stroke. CT head 11/18, 11/17, 11/14 negative for any acute intracranial pathology. EEG shows encephalopathy without any acute abnormality. B12 845.  TSH 4.1, free T41.19.  Folate 28.2 ammonia 17.  COVID-negative.  VBG showed pH of 7.4 PCO2 40. There are some concern for cystitis but urine culture is negative. Mentation per family still not back to baseline. Currently suspect concussion as a potential etiology. Avoiding psychotropic medications. If symptoms does not improve may consider Aricept. Patient is more alert.  We will get PT OT evaluation.  Concern for acute UTI. Recurrent UTI history. Patient treated for 3 days of ceftriaxone. We will resume Macrobid.  Chronic A-fib. Patient is on Tikosyn and Eliquis. Maintain potassium more than 4. Continue Eliquis 5 mg twice  daily. Multiple CT scan negative for any acute bleeding.  Type 2 diabetes mellitus, uncontrolled with hyperglycemia with history of diabetic neuropathy and long-term insulin use. Due to metabolic acidosis Jardiance is on hold. Hold metformin. Patient is on Lantus 8 units currently on hold due to poor p.o. intake. Continue sliding scale insulin every 4 hours. Monitor.  History of chronic diastolic CHF. HTN Volume status adequate right now rather on dry side. Treated with IV hydration. Continue losartan. Hold Lasix and potassium.  Osteoporosis. Recently received Reclast prior to admission.  Insomnia. Family mention that the patient used to take 5 mg of melatonin nightly. 3 days before a recent fall switch to natures bounty sleep aid with L-theanine 200 mg, melatonin 10 mg and herbal blend. Has not received that medicine here in the hospital. Monitor.  Mood disorder. Patient is on Cymbalta 30 mg daily. Currently on hold.  Vitamin D deficiency. On vitamin supplementation.  Currently on hold.  Overactive bladder. on Myrbetriq.  Resume.  Dysphagia. Speech therapy following the patient. Dysphagia most likely secondary to generalized fatigue and encephalopathy. Currently on dysphagia 2 diet. Monitor. MRI brain negative for any acute stroke.  Mnire's disease. Monitor.  History of COPD. Intermittent cough. Monitor. Does not appear to have any evidence of exacerbation right now.  CAD. Per daughter she was also brought to the hospital for complaints of 'heart' symptoms. EKG in ER showed RBBB and LAFB without any significant ST-T wave abnormalities consistent with ACS. Troponin x2 22 and 23.  Abnormal but not consistent with ACS pattern. Patient has not had any complaints of chest pain. Telemetry was negative for any significant events. For now monitor.  12 mm right kidney cyst. Unchanged in size compared to 2021 exam. Renal MRI protocol outpatient  recommended.  Scoliosis. Seen on the CT. Monitor.  Remote fractures of left superior and inferior pubic rami seen.   Subjective: Mentation improving.  No nausea no vomiting.  Following commands.  Slow to respond which is baseline.  Physical Exam: General: Appear in mild distress; Cardiovascular: S1 and S2 Present, no Murmur, Respiratory: good respiratory effort, Bilateral Air entry present, CTA, no Crackles, no wheezes Abdomen: Bowel Sound present, Non tender  Extremities: no Pedal edema Neurology: alert and oriented to self   Data Reviewed: I have Reviewed nursing notes, Vitals, and Lab results. Since last encounter, pertinent lab results CBC and BMP   . I have ordered test including BMP  .   Disposition: Status is: Inpatient Remains inpatient appropriate because: Need to ensure oral intake is adequate.  SCDs Start: 01/06/22 0123 apixaban (ELIQUIS) tablet 5 mg   Family Communication: Discussed with daughter and grandson on 11/20. Level of care: Med-Surg   Vitals:   01/08/22 1343 01/08/22 2041 01/09/22 0441 01/09/22 1328  BP: (!) 142/86 (!) 154/84  133/70  Pulse: 97 71  66  Resp: '16 20  17  '$ Temp: 98.3 F (36.8 C) 98.3 F (36.8 C)  98 F (36.7 C)  TempSrc:  Oral  Oral  SpO2: 100% 94%  97%  Weight:   59.2 kg   Height:         Author: Berle Mull, MD 01/09/2022 6:43 PM  Please look on [email or web address] to find out who is on call.

## 2022-01-10 DIAGNOSIS — I5032 Chronic diastolic (congestive) heart failure: Secondary | ICD-10-CM

## 2022-01-10 DIAGNOSIS — E119 Type 2 diabetes mellitus without complications: Secondary | ICD-10-CM

## 2022-01-10 DIAGNOSIS — I2510 Atherosclerotic heart disease of native coronary artery without angina pectoris: Secondary | ICD-10-CM

## 2022-01-10 DIAGNOSIS — R413 Other amnesia: Secondary | ICD-10-CM

## 2022-01-10 DIAGNOSIS — G9340 Encephalopathy, unspecified: Secondary | ICD-10-CM | POA: Diagnosis not present

## 2022-01-10 DIAGNOSIS — I10 Essential (primary) hypertension: Secondary | ICD-10-CM | POA: Diagnosis not present

## 2022-01-10 DIAGNOSIS — G9341 Metabolic encephalopathy: Secondary | ICD-10-CM

## 2022-01-10 DIAGNOSIS — J449 Chronic obstructive pulmonary disease, unspecified: Secondary | ICD-10-CM

## 2022-01-10 DIAGNOSIS — R7989 Other specified abnormal findings of blood chemistry: Secondary | ICD-10-CM

## 2022-01-10 DIAGNOSIS — I480 Paroxysmal atrial fibrillation: Secondary | ICD-10-CM

## 2022-01-10 DIAGNOSIS — I272 Pulmonary hypertension, unspecified: Secondary | ICD-10-CM

## 2022-01-10 DIAGNOSIS — S060XAD Concussion with loss of consciousness status unknown, subsequent encounter: Secondary | ICD-10-CM

## 2022-01-10 DIAGNOSIS — N3000 Acute cystitis without hematuria: Secondary | ICD-10-CM

## 2022-01-10 LAB — BASIC METABOLIC PANEL
Anion gap: 8 (ref 5–15)
BUN: 24 mg/dL — ABNORMAL HIGH (ref 8–23)
CO2: 21 mmol/L — ABNORMAL LOW (ref 22–32)
Calcium: 8.5 mg/dL — ABNORMAL LOW (ref 8.9–10.3)
Chloride: 111 mmol/L (ref 98–111)
Creatinine, Ser: 0.46 mg/dL (ref 0.44–1.00)
GFR, Estimated: >60 mL/min (ref >60)
Glucose, Bld: 143 mg/dL — ABNORMAL HIGH (ref 70–99)
Potassium: 4.2 mmol/L (ref 3.5–5.1)
Sodium: 140 mmol/L (ref 135–145)

## 2022-01-10 LAB — GLUCOSE, CAPILLARY
Glucose-Capillary: 136 mg/dL — ABNORMAL HIGH (ref 70–99)
Glucose-Capillary: 164 mg/dL — ABNORMAL HIGH (ref 70–99)
Glucose-Capillary: 306 mg/dL — ABNORMAL HIGH (ref 70–99)
Glucose-Capillary: 150 mg/dL — ABNORMAL HIGH (ref 70–99)

## 2022-01-10 MED ORDER — INSULIN ASPART 100 UNIT/ML IJ SOLN
0.0000 [IU] | Freq: Three times a day (TID) | INTRAMUSCULAR | Status: DC
  Administered 2022-01-10: 4 [IU] via SUBCUTANEOUS
  Administered 2022-01-11 (×2): 1 [IU] via SUBCUTANEOUS
  Administered 2022-01-11: 2 [IU] via SUBCUTANEOUS
  Administered 2022-01-12: 5 [IU] via SUBCUTANEOUS
  Administered 2022-01-12: 1 [IU] via SUBCUTANEOUS
  Administered 2022-01-12 – 2022-01-13 (×4): 2 [IU] via SUBCUTANEOUS
  Administered 2022-01-14: 5 [IU] via SUBCUTANEOUS
  Administered 2022-01-14: 3 [IU] via SUBCUTANEOUS
  Administered 2022-01-14: 1 [IU] via SUBCUTANEOUS
  Administered 2022-01-15 (×2): 2 [IU] via SUBCUTANEOUS

## 2022-01-10 MED ORDER — FOOD THICKENER (SIMPLYTHICK): 1.0000 | ORAL | Status: DC | PRN

## 2022-01-10 MED ORDER — THIAMINE HCL 100 MG/ML IJ SOLN: 100.0000 mg | Freq: Every day | INTRAMUSCULAR | Status: DC

## 2022-01-10 MED ORDER — THIAMINE HCL 100 MG/ML IJ SOLN
500.0000 mg | Freq: Three times a day (TID) | INTRAVENOUS | Status: AC
  Administered 2022-01-10 – 2022-01-12 (×6): 500 mg via INTRAVENOUS
  Filled 2022-01-10 (×8): qty 5

## 2022-01-10 MED ORDER — INSULIN ASPART 100 UNIT/ML IJ SOLN
0.0000 [IU] | Freq: Every day | INTRAMUSCULAR | Status: DC
  Administered 2022-01-11: 3 [IU] via SUBCUTANEOUS
  Administered 2022-01-12: 4 [IU] via SUBCUTANEOUS
  Administered 2022-01-13 – 2022-01-14 (×2): 2 [IU] via SUBCUTANEOUS

## 2022-01-10 MED ORDER — THIAMINE HCL 100 MG/ML IJ SOLN
250.0000 mg | Freq: Every day | INTRAVENOUS | Status: DC
  Administered 2022-01-13 – 2022-01-17 (×5): 250 mg via INTRAVENOUS
  Filled 2022-01-10 (×5): qty 2.5

## 2022-01-10 MED ORDER — DULOXETINE HCL 30 MG PO CPEP
30.0000 mg | ORAL_CAPSULE | Freq: Every day | ORAL | Status: DC
  Administered 2022-01-10 – 2022-01-17 (×8): 30 mg via ORAL
  Filled 2022-01-10 (×8): qty 1

## 2022-01-10 NOTE — TOC Initial Note (Signed)
Transition of Care [facility]) - Initial/Assessment Note    Patient Details  Name: Melanie Lucas MRN: [id number] Date of Birth: 11-Aug-1950  Transition [facility]) CM/SW Contact:    Vassie Moselle, LCSW Phone Number: 01/10/2022, 3:01 PM  Clinical Narrative:                 Spoke with pt's daughter over the phone and confirmed plan for SNF placement. Pt's daughter shares this pt receives 24/7 care from pt's son and daughter. Pt has been to SNF multiple times this past year and was most recently at [facility]. Pt's family would like pt to go to [facility] again for SNF placement.  Referrals have been sent out for SNF placement and currently awaiting bed offers.        Patient Goals and CMS Choice        Expected Discharge Plan and Services                                                Prior Living Arrangements/Services                       Activities of Daily Living Home Assistive Devices/Equipment: None ADL Screening (condition at time of admission) Patient's cognitive ability adequate to safely complete daily activities?: No Is the patient deaf or have difficulty hearing?: Yes Does the patient have difficulty seeing, even when wearing glasses/contacts?: No Does the patient have difficulty concentrating, remembering, or making decisions?: No Patient able to express need for assistance with ADLs?: Yes Does the patient have difficulty dressing or bathing?: No Independently performs ADLs?: Yes (appropriate for developmental age) Does the patient have difficulty walking or climbing stairs?: No Weakness of Legs: None Weakness of Arms/Hands: None  Permission Sought/Granted                  Emotional Assessment              Admission diagnosis:  Diverticulosis [K57.90] Encephalopathy [G93.40] Acute encephalopathy [G93.40] Recurrent falls [R29.6] Cystitis [N30.90] Unable to ambulate [R26.2] Patient Active Problem List   Diagnosis Date  Noted   Acute encephalopathy 01/06/2022   Acute cystitis 01/06/2022   Malnutrition of moderate degree 09/01/2021   Subdural hemorrhage ([place]) 08/31/2021   Fall at home, initial encounter 08/31/2021   Elevated troponin 08/31/2021   History of cerebrovascular accident (CVA) with residual deficit 08/31/2021   Meniere disease 08/31/2021   Memory loss 08/31/2021   Acquired thrombophilia ([place]) 07/25/2021   Acute ischemic stroke ([place]) 07/24/2021   Nicotine dependence, cigarettes, uncomplicated 44/81/8563   Chronic diastolic CHF (congestive heart failure) ([place]) 03/08/2021   Renal lesion 03/08/2021   Sternal fracture 03/07/2021   Hypokalemia 03/07/2021   CVA (cerebral vascular accident) ([place]) 03/07/2021   Chronic anticoagulation 03/07/2021   Left homonymous hemianopsia 03/06/2021   Stroke-like episode 14/97/0263   Acute metabolic encephalopathy 78/58/8502   Depression 12/31/2017   Stroke ([place]) 12/31/2017   COPD (chronic obstructive pulmonary disease) ([place]) 12/30/2017   Left carotid artery stenosis 07/29/2017   TIA (transient ischemic attack) 06/24/2017   Ischemic stroke ([place]) 06/23/2017   Cervical spondylosis with myelopathy 02/21/2016   Cervical stenosis of spine 11/24/2014   Numbness of fingers of both hands 11/24/2014   Mixed diabetic hyperlipidemia associated with type 2 diabetes mellitus ([place]) 11/09/2014   Type 2  diabetes mellitus without complication, without long-term current use of insulin ([place]) 11/09/2014   Pulmonary hypertension ([place]) 08/07/2013   Coronary artery disease involving native coronary artery of native heart without angina pectoris 03/11/2013   Peripheral edema 02/25/2012   Essential hypertension, benign 05/06/2009   Overweight 04/13/2009   TOBACCO ABUSE 04/13/2009   Atrial fibrillation ([place]) 04/13/2009   PCP:  Sue Lush, PA-C Pharmacy:   [facility] [place] [address] Phone: [phone]  Fax: [phone]  [facility] [address] [place] [address] Phone: [phone] Fax: [phone]  [id number] [place]Phone: [phone]: [phone]    Social Determinants of Health (SDOH) Interventions Food Insecurity Interventions: Intervention Not Indicated Housing Interventions: Intervention Not Indicated Transportation Interventions: Intervention Not Indicated Utilities Interventions: Intervention Not Indicated  Readmission Risk Interventions    01/10/2022    3:00 PM 08/01/2021   10:59 AM  Readmission Risk Prevention Plan  Transportation Screening Complete Complete  PCP or Specialist Appt within 5-7 Days  Complete  PCP or Specialist Appt within 3-5 Days Complete   Home Care Screening  Complete  Medication Review (RN CM)  Complete  HRI or Home Care Consult Complete   Social Work Consult for [place]Counseling Complete   Palliative Care Screening Not Applicable   Medication Review (Press photographer Complete

## 2022-01-10 NOTE — NC FL2 (Signed)
[place] LEVEL OF CARE SCREENING TOOL     IDENTIFICATION  Patient Name: Melanie Lucas Birthdate: 03-01-1950 Sex: female Admission Date (Current Location): 01/05/2022  [facility] and [place] Number:  Herbalist and Address:  [facility],  [address], [place]      Provider Number: [id number]  Attending Physician Name and Address:  Mercy Riding, MD  Relative Name and Phone Number:  Daughter, Jullian Previti [phone]    Current Level of Care: Hospital Recommended Level of Care: [place] Prior Approval Number:    Date Approved/Denied:   PASRR Number: [id number]  Discharge Plan: SNF    Current Diagnoses: Patient Active Problem List   Diagnosis Date Noted   Acute encephalopathy 01/06/2022   Acute cystitis 01/06/2022   Malnutrition of moderate degree 09/01/2021   Subdural hemorrhage ([place]) 08/31/2021   Fall at home, initial encounter 08/31/2021   Elevated troponin 08/31/2021   History of cerebrovascular accident (CVA) with residual deficit 08/31/2021   Meniere disease 08/31/2021   Memory loss 08/31/2021   Acquired thrombophilia ([place]) 07/25/2021   Acute ischemic stroke ([place]) 07/24/2021   Nicotine dependence, cigarettes, uncomplicated 02/33/4356   Chronic diastolic CHF (congestive heart failure) ([place]) 03/08/2021   Renal lesion 03/08/2021   Sternal fracture 03/07/2021   Hypokalemia 03/07/2021   CVA (cerebral vascular accident) ([place]) 03/07/2021   Chronic anticoagulation 03/07/2021   Left homonymous hemianopsia 03/06/2021   Stroke-like episode 86/16/8372   Acute metabolic encephalopathy 90/21/1155   Depression 12/31/2017   Stroke ([place]) 12/31/2017   COPD (chronic obstructive pulmonary disease) ([place]) 12/30/2017   Left carotid artery stenosis 07/29/2017   TIA (transient ischemic attack) 06/24/2017   Ischemic stroke ([place]) 06/23/2017   Cervical spondylosis with myelopathy 02/21/2016   Cervical stenosis of  spine 11/24/2014   Numbness of fingers of both hands 11/24/2014   Mixed diabetic hyperlipidemia associated with type 2 diabetes mellitus ([place]) 11/09/2014   Type 2 diabetes mellitus without complication, without long-term current use of insulin ([place]) 11/09/2014   Pulmonary hypertension ([place]) 08/07/2013   Coronary artery disease involving native coronary artery of native heart without angina pectoris 03/11/2013   Peripheral edema 02/25/2012   Essential hypertension, benign 05/06/2009   Overweight 04/13/2009   TOBACCO ABUSE 04/13/2009   Atrial fibrillation ([place]) 04/13/2009    Orientation RESPIRATION BLADDER Height & Weight     Self  Normal Incontinent, External catheter Weight: 124 lb 12.5 oz (56.6 kg) Height:  '5\' 7"'$  (170.2 cm)  BEHAVIORAL SYMPTOMS/MOOD NEUROLOGICAL BOWEL NUTRITION STATUS      Incontinent Diet (Regular)  AMBULATORY STATUS COMMUNICATION OF NEEDS Skin   Total Care Verbally Normal                       Personal Care Assistance Level of Assistance  Bathing, Feeding, Dressing Bathing Assistance: Maximum assistance Feeding assistance: Maximum assistance Dressing Assistance: Maximum assistance     Functional Limitations Info  Sight, Hearing, Speech Sight Info: Adequate Hearing Info: Impaired Speech Info: Impaired    SPECIAL CARE FACTORS FREQUENCY  PT (By licensed PT), OT (By licensed OT)     PT Frequency: 5x/wk OT Frequency: 5x/wk            Contractures Contractures Info: Not present    Additional Factors Info  Code Status, Allergies Code Status Info: FULL Allergies Info: Alendronate Sodium, Avelox (Moxifloxacin), Fosamax (Alendronate), Haldol (Haloperidol), Pradaxa (Dabigatran Etexilate Mesylate), Seroquel (Quetiapine), Statins, Sulfa Antibiotics, Sulfamethoxazole, Sulfonamide Derivatives,  Varenicline, Ciprofloxacin, Gabapentin, Tape           Current Medications (01/10/2022):  This is the current hospital active medication list Current  Facility-Administered Medications  Medication Dose Route Frequency Provider Last Rate Last Admin   acetaminophen (TYLENOL) tablet 650 mg  650 mg Oral Q6H PRN Howerter, Justin B, DO   650 mg at 01/06/22 1817   Or   acetaminophen (TYLENOL) suppository 650 mg  650 mg Rectal Q6H PRN Howerter, Justin B, DO       apixaban (ELIQUIS) tablet 5 mg  5 mg Oral BID Reubin Milan, MD   5 mg at 01/10/22 0935   dofetilide (TIKOSYN) capsule 250 mcg  250 mcg Oral BID Reubin Milan, MD   250 mcg at 01/10/22 0840   DULoxetine (CYMBALTA) DR capsule 30 mg  30 mg Oral Daily Gonfa, Taye T, MD       feeding supplement (ENSURE ENLIVE / ENSURE PLUS) liquid 237 mL  237 mL Oral TID BM Lavina Hamman, MD   237 mL at 01/10/22 1318   hydrALAZINE (APRESOLINE) injection 10 mg  10 mg Intravenous Q4H PRN Lavina Hamman, MD       insulin aspart (novoLOG) injection 0-5 Units  0-5 Units Subcutaneous QHS Lavina Hamman, MD       insulin aspart (novoLOG) injection 0-9 Units  0-9 Units Subcutaneous TID WC Lavina Hamman, MD   2 Units at 01/10/22 1211   losartan (COZAAR) tablet 50 mg  50 mg Oral Daily Reubin Milan, MD   50 mg at 01/10/22 0935   mirabegron ER (MYRBETRIQ) tablet 50 mg  50 mg Oral QHS Lavina Hamman, MD       multivitamin with minerals tablet 1 tablet  1 tablet Oral Daily Lavina Hamman, MD   1 tablet at 01/10/22 4497   Oral care mouth rinse  15 mL Mouth Rinse 4 times per day Lavina Hamman, MD   15 mL at 01/10/22 1219   Oral care mouth rinse  15 mL Mouth Rinse PRN Lavina Hamman, MD       potassium chloride SA (KLOR-CON M) CR tablet 20 mEq  20 mEq Oral Daily Reubin Milan, MD   20 mEq at 01/10/22 5300     Discharge Medications: Please see discharge summary for a list of discharge medications.  Relevant Imaging Results:  Relevant Lab Results:   Additional Information SSN: [id number]  Vassie Moselle, LCSW

## 2022-01-10 NOTE — Plan of Care (Signed)
  Problem: Safety: Goal: Non-violent Restraint(s) Outcome: Progressing   Problem: Education: Goal: Knowledge of General Education information will improve Description: Including pain rating scale, medication(s)/side effects and non-pharmacologic comfort measures Outcome: Progressing   Problem: Health Behavior/Discharge Planning: Goal: Ability to manage health-related needs will improve Outcome: Progressing   Problem: Clinical Measurements: Goal: Ability to maintain clinical measurements within normal limits will improve Outcome: Progressing Goal: Will remain free from infection Outcome: Progressing Goal: Diagnostic test results will improve Outcome: Progressing Goal: Respiratory complications will improve Outcome: Progressing Goal: Cardiovascular complication will be avoided Outcome: Progressing   Problem: Activity: Goal: Risk for activity intolerance will decrease Outcome: Progressing   Problem: Nutrition: Goal: Adequate nutrition will be maintained Outcome: Progressing   Problem: Coping: Goal: Level of anxiety will decrease Outcome: Progressing   Problem: Elimination: Goal: Will not experience complications related to bowel motility Outcome: Progressing Goal: Will not experience complications related to urinary retention Outcome: Progressing   Problem: Pain Managment: Goal: General experience of comfort will improve Outcome: Progressing   Problem: Safety: Goal: Ability to remain free from injury will improve Outcome: Progressing   Problem: Skin Integrity: Goal: Risk for impaired skin integrity will decrease Outcome: Progressing   Problem: Education: Goal: Ability to describe self-care measures that may prevent or decrease complications (Diabetes Survival Skills Education) will improve Outcome: Progressing Goal: Individualized Educational Video(s) Outcome: Progressing   Problem: Coping: Goal: Ability to adjust to condition or change in health will  improve Outcome: Progressing   Problem: Fluid Volume: Goal: Ability to maintain a balanced intake and output will improve Outcome: Progressing   Problem: Health Behavior/Discharge Planning: Goal: Ability to identify and utilize available resources and services will improve Outcome: Progressing Goal: Ability to manage health-related needs will improve Outcome: Progressing   Problem: Metabolic: Goal: Ability to maintain appropriate glucose levels will improve Outcome: Progressing   Problem: Nutritional: Goal: Maintenance of adequate nutrition will improve Outcome: Progressing Goal: Progress toward achieving an optimal weight will improve Outcome: Progressing   Problem: Skin Integrity: Goal: Risk for impaired skin integrity will decrease Outcome: Progressing   Problem: Tissue Perfusion: Goal: Adequacy of tissue perfusion will improve Outcome: Progressing

## 2022-01-10 NOTE — Progress Notes (Signed)
Speech Language Pathology Treatment: Dysphagia  Patient Details Name: Melanie Lucas MRN: [id number] DOB: 08/09/1950 Today's Date: 01/10/2022 Time: [id number] SLP Time Calculation (min) (ACUTE ONLY): 25 min  Assessment / Plan / Recommendation Clinical Impression  Patient seen for dysphagia management. Daughter not present during session.  Pt with significant dysarthria and decreased ability to seal her lips.  Facilitated session by having pt help hand over hand or holding her own cup to consume thin, nectar thick soda, Ensure mixed with ice cream, applesauce, and churro.  Pt is orally holding intake and having congested cough after thin liquids via cup/straw. She is grossly dysarthric and has very delayed verbal responses if she responds at all.  Clinically she tolerated nectar via cup and tsps of thin liquids much better than thin alone via cup or straw.  At times, pt required cues to swallow due to cognition.    Recommend change to puree/nectar or full liquid diet - nectar and allow tsps of thin liquid regardless of diet. Allow family to provide pt with churros, soft food items only as tolerated and for institutionalized feeding - recommend modified diet.  Have pt help hand over hand  to self feed.   MBS advised Friday 01/12/2022 to adequately assess oropharyngeal swallow. She has recurrent hospital admits - concerning for overall nutrition adequacy.    Requested RN set up oral suction - she set up and informed her of recommendations. Posted new swallow precautions and educated pt to recommendation/plan.    HPI HPI: Patient is a 71 y.o. female with PMH: anxiety, depression, CHF, COPD, right ear deafness, DM-2, HTN, HLD, incontinence, left carotid stenosis, h/o CVA's with residual dysarthria, memory issues, balance issues, cognitive impairment (score of 10 out of 30 on SLUMS  07/27/21). She presented to the hospital on 01/05/2022 due to AMS. Patient had fall four days prior to this admission and was  discharged from the hospital in stable condition. Daughter reported that since the fall, she has had difficulty talking, following commands, walking and tolerating oral intake. In ED, she was afebrile, RR 18 and saturating at 99% on RA. She was admitted with acute metabolic encephalopathy in the setting of acute cystitis.  Pt underwent BSE  and was placed on dys3/thin diet - downgraded to dys2/thin diet.  Follow up for dysphagia management indicated.      SLP Plan  Continue with current plan of care;MBS [facility] Friday January 12, 2022)      Recommendations for follow up therapy are one component of a multi-disciplinary discharge planning process, led by the attending physician.  Recommendations may be updated based on patient status, additional functional criteria and insurance authorization.    Recommendations  Liquids provided via: Straw;Cup;Teaspoon Medication Administration: Crushed with puree Supervision: Full supervision/cueing for compensatory strategies;Staff to assist with self feeding Compensations: Small sips/bites;Slow rate;Minimize environmental distractions;Lingual sweep for clearance of pocketing Postural Changes and/or Swallow Maneuvers: Seated upright 90 degrees;Upright 30-60 min after meal                Oral Care Recommendations: Oral care BID;Oral care before and after PO Follow Up Recommendations: Skilled nursing-short term rehab (<3 hours/day) SLP Visit Diagnosis: Dysphagia, unspecified (R13.10) Plan: Continue with current plan of care;MBS [facility] Friday January 12, 2022)          Melanie Lime, MS [facility] SLP Acute Rehab Services Office [phone] Pager [phone]  Melanie Lucas  01/10/2022, 4:53 PM

## 2022-01-10 NOTE — Progress Notes (Signed)
PROGRESS NOTE  Melanie Lucas XTK:[id number] DOB: [birth year]   PCP: Sue Lush, PA-C  Patient is from: Home.  Uses rolling walker at baseline.  DOA: 01/05/2022 LOS: 4  Chief complaints Chief Complaint  Patient presents with   Altered Mental Status     Brief Narrative / Interim history: 71 year old F with PMH of COPD, CAD, Mnire's disease with right ear deafness, type II DM, neuropathy, left carotid stenosis, HLD, osteoporosis, paroxysmal A-fib on Tikosyn/Eliquis, anxiety, depression and former smoker presented to the ER with complaints of confusion, decreased oral intake, decreased interaction and excessive sleepiness, and admitted for acute encephalopathy and possible concussion syndrome.  Previously seen in ED on 10/14 for cystitis and near syncope.  Had another ED visit on 10/14 for fall.  CT head and C-spine without significant finding.  Return to ED on 11/17 due to the above complaints.  Completed 3 days of IV antibiotics for possible cystitis but no improvement in mental status.  Encephalopathy work-up including CT head, MRI brain, B12, TSH, free T4, ammonia and VBG unrevealing.  EEG negative for seizure but encephalopathy.  Neurology consulted on 11/22.  Subjective: Seen and examined earlier this morning.  No major events overnight of this morning.  Patient is awake but not interactive.  Follows some commands.  No focal neurodeficit but limited exam.  Objective: Vitals:   01/09/22 2037 01/10/22 0435 01/10/22 0500 01/10/22 1356  BP: (!) 150/91 (!) 142/78  122/71  Pulse: 77 65  72  Resp: '19 18  17  '$ Temp: 98.8 F (37.1 C) (!) 97.5 F (36.4 C)  98.5 F (36.9 C)  TempSrc:  Oral  Oral  SpO2: 96% 100%  95%  Weight:   56.6 kg   Height:        Examination:  GENERAL: No apparent distress.  Nontoxic. HEENT: MMM.  Vision and hearing grossly intact.  NECK: Supple.  No apparent JVD.  RESP:  No IWOB.  Fair aeration bilaterally. CVS:  RRR. Heart sounds normal.  ABD/GI/GU:  BS+. Abd soft, NTND.  MSK/EXT:  Moves extremities. No apparent deformity. No edema.  SKIN: no apparent skin lesion or wound NEURO: Awake but not oriented.  Follows some commands.  No apparent focal neuro deficit but not withdrawing to pain stimuli in lower extremities bilaterally.  PERRL.  No facial asymmetry.  Moves extremities. PSYCH: Calm.  No distress or agitation.  Procedures:  None  Microbiology summarized: 11/17-COVID-19 and influenza PCR nonreactive 11/17-urine cultures NGTD  Assessment and plan: Principal Problem:   Acute encephalopathy Active Problems:   Atrial fibrillation (HCC)   Acute metabolic encephalopathy   Chronic diastolic CHF (congestive heart failure) (HCC)   Essential hypertension, benign   Coronary artery disease involving native coronary artery of native heart without angina pectoris   Pulmonary hypertension (HCC)   Type 2 diabetes mellitus without complication, without long-term current use of insulin (HCC)   COPD (chronic obstructive pulmonary disease) (HCC)   Depression   Memory loss   Acute cystitis  Acute encephalopathy//possible concussion: Walks with walker, feed herself, requires assistance for bathing, has slow speech with some dysarthria/expressive aphasia at baseline.  Reportedly uses walker, encephalopathy work-up including CT head, MRI brain, B12, TSH, ammonia and blood gas negative.  EEG showed encephalopathy but no seizure or epileptiform discharge. Reportedly had a fall on 11/14 raising concern for possible concussion syndrome. -Avoid psychotropic medications.  -Fall and delirium precaution -Neurology consult -Check thiamine level    Possible UTI: Urine culture negative.  Treated  with ceftriaxone for 3 days -Continue home Macrobid for history of recurrent UTI   Chronic A-fib -Continue home Tikosyn and Eliquis -Optimize electrolytes   Uncontrolled IDDM-2 with hyperglycemia: A1c 7.0% on 11/19. Recent Labs  Lab 01/09/22 1135  01/09/22 1633 01/09/22 2033 01/10/22 0835 01/10/22 1156  GLUCAP 139* 140* 184* 136* 164*  -Continue current insulin regimen and statin -Suggest discontinuing Jardiance on discharge given history of recurrent UTI  History of chronic diastolic CHF: Appears euvolemic. -Continue holding home Lasix -Monitor fluid status  History of CAD/mild troponin elevation: EKG showed RBBB and LAFB without any significant ST-T wave abnormalities consistent with ACS. Troponin x2 22 and 23.  Abnormal but not consistent with ACS pattern.  No major events on telemetry. -Continue home meds   Osteoporosis. -Recently received Reclast prior to admission.  Essential hypertension: Normotensive. -Continue home losartan.  Insomnia.  Started using natures bounty sleep aid with L-theanine 200 mg, melatonin 10 mg and herbal blend 3 days prior to presentation. -Monitor.   Mood disorder. -Resume home Cymbalta.  Overactive bladder. -Continue home Myrbetriq   Dysphagia: Likely from fatigue and encephalopathy. -Continue dysphagia 2 diet per SLP   Mnire's disease. Monitor.   History of COPD: Stable.   Renal cyst: 12 mm right kidney cyst noted on CT.  Unchanged from 2021. -Renal MRI protocol outpatient recommended.   Remote fractures of left superior and inferior pubic rami seen.  Body mass index is 19.54 kg/m. -Consult dietitian  DVT prophylaxis:  SCDs Start: 01/06/22 0123 apixaban (ELIQUIS) tablet 5 mg  Code Status: Full code Family Communication: Updated patient's daughter, Heather Roberts over the phone.  Level of care: Med-Surg Status is: Inpatient Remains inpatient appropriate because: Acute encephalopathy   Final disposition: SNF Consultants:  Neurology  Sch Meds:  Scheduled Meds:  apixaban  5 mg Oral BID   dofetilide  250 mcg Oral BID   DULoxetine  30 mg Oral Daily   feeding supplement  237 mL Oral TID BM   insulin aspart  0-5 Units Subcutaneous QHS   insulin aspart  0-9 Units  Subcutaneous TID WC   losartan  50 mg Oral Daily   mirabegron ER  50 mg Oral QHS   multivitamin with minerals  1 tablet Oral Daily   mouth rinse  15 mL Mouth Rinse 4 times per day   potassium chloride SA  20 mEq Oral Daily   Continuous Infusions: PRN Meds:.acetaminophen **OR** acetaminophen, hydrALAZINE, mouth rinse  Antimicrobials: Anti-infectives (From admission, onward)    Start     Dose/Rate Route Frequency Ordered Stop   01/06/22 2200  cefTRIAXone (ROCEPHIN) 1 g in sodium chloride 0.9 % 100 mL IVPB  Status:  Discontinued        1 g 200 mL/hr over 30 Minutes Intravenous Every 24 hours 01/06/22 0851 01/08/22 0902   01/06/22 1700  fluconazole (DIFLUCAN) tablet 150 mg        150 mg Oral  Once 01/06/22 1407 01/06/22 1724   01/05/22 2230  cefTRIAXone (ROCEPHIN) 1 g in sodium chloride 0.9 % 100 mL IVPB        1 g 200 mL/hr over 30 Minutes Intravenous  Once 01/05/22 2216 01/06/22 0122        I have personally reviewed the following labs and images: CBC: Recent Labs  Lab 01/05/22 1856 01/06/22 0550  WBC 6.1 6.9  NEUTROABS 4.0 4.8  HGB 15.5* 14.8  HCT 46.2* 43.7  MCV 92.6 92.8  PLT 237 224   BMP &GFR Recent  Labs  Lab 01/05/22 1856 01/06/22 0550 01/07/22 0847 01/07/22 1535 01/08/22 0620 01/09/22 0534 01/10/22 0524  NA 142 140 141 143 143 139 140  K 3.5 3.5 3.4* 4.0 4.4 3.6 4.2  CL 109 109 107 109 107 107 111  CO2 24 21* 18* '22 23 23 '$ 21*  GLUCOSE 145* 119* 125* 144* 99 114* 143*  BUN '18 15 19 20 20 19 '$ 24*  CREATININE 0.62 0.44 0.50 0.70 0.48 0.53 0.46  CALCIUM 9.1 8.1* 8.7* 8.6* 8.6* 8.6* 8.5*  MG 2.1 2.1 2.2  --   --   --   --    Estimated Creatinine Clearance: 56 mL/min (by C-G formula based on SCr of 0.46 mg/dL). Liver & Pancreas: Recent Labs  Lab 01/05/22 1856 01/06/22 0550  AST 18 17  ALT 17 15  ALKPHOS 70 68  BILITOT 1.0 1.1  PROT 6.6 6.3*  ALBUMIN 3.8 3.4*   No results for input(s): "LIPASE", "AMYLASE" in the last 168 hours. Recent Labs  Lab  01/05/22 1943  AMMONIA 17   Diabetic: Recent Labs    01/07/22 1535  HGBA1C 7.0*   Recent Labs  Lab 01/09/22 1135 01/09/22 1633 01/09/22 2033 01/10/22 0835 01/10/22 1156  GLUCAP 139* 140* 184* 136* 164*   Cardiac Enzymes: No results for input(s): "CKTOTAL", "CKMB", "CKMBINDEX", "TROPONINI" in the last 168 hours. No results for input(s): "PROBNP" in the last 8760 hours. Coagulation Profile: No results for input(s): "INR", "PROTIME" in the last 168 hours. Thyroid Function Tests: Recent Labs    01/07/22 1535  TSH 4.167  FREET4 1.19*   Lipid Profile: No results for input(s): "CHOL", "HDL", "LDLCALC", "TRIG", "CHOLHDL", "LDLDIRECT" in the last 72 hours. Anemia Panel: Recent Labs    01/07/22 1535 01/07/22 1909  VITAMINB12  --  845  FOLATE 28.2  --    Urine analysis:    Component Value Date/Time   COLORURINE YELLOW 01/05/2022 2036   APPEARANCEUR HAZY (A) 01/05/2022 2036   LABSPEC 1.033 (H) 01/05/2022 2036   PHURINE 5.0 01/05/2022 2036   GLUCOSEU >=500 (A) 01/05/2022 2036   HGBUR NEGATIVE 01/05/2022 2036   BILIRUBINUR NEGATIVE 01/05/2022 2036   BILIRUBINUR negative 10/17/2015 1839   KETONESUR 20 (A) 01/05/2022 2036   PROTEINUR NEGATIVE 01/05/2022 2036   UROBILINOGEN 0.2 10/17/2015 1839   UROBILINOGEN 1.0 09/20/2010 0025   NITRITE NEGATIVE 01/05/2022 2036   LEUKOCYTESUR MODERATE (A) 01/05/2022 2036   Sepsis Labs: Invalid input(s): "PROCALCITONIN", "LACTICIDVEN"  Microbiology: Recent Results (from the past 240 hour(s))  Urine Culture     Status: None   Collection Time: 01/02/22  4:30 PM   Specimen: Urine, Catheterized  Result Value Ref Range Status   Specimen Description URINE, CATHETERIZED  Final   Special Requests NONE  Final   Culture   Final    NO GROWTH Performed at [facility], [address] [place], [place] [address]    Report Status 01/03/2022 FINAL  Final  Resp Panel by RT-PCR (Flu A&B, Covid) Anterior Nasal Swab     Status: None    Collection Time: 01/05/22  6:57 PM   Specimen: Anterior Nasal Swab  Result Value Ref Range Status   SARS Coronavirus 2 by RT PCR NEGATIVE NEGATIVE Final    Comment: (NOTE) SARS-CoV-2 target nucleic acids are NOT DETECTED.  The SARS-CoV-2 RNA is generally detectable in upper respiratory specimens during the acute phase of infection. The lowest concentration of SARS-CoV-2 viral copies this assay can detect is 138 copies/mL. A negative  result does not preclude SARS-Cov-2 infection and should not be used as the sole basis for treatment or other patient management decisions. A negative result may occur with  improper specimen collection/handling, submission of specimen other than nasopharyngeal swab, presence of viral mutation(s) within the areas targeted by this assay, and inadequate number of viral copies(<138 copies/mL). A negative result must be combined with clinical observations, patient history, and epidemiological information. The expected result is Negative.  Fact Sheet for Patients:  EntrepreneurPulse.com.au  Fact Sheet for Healthcare Providers:  IncredibleEmployment.be  This test is no t yet approved or cleared by the [place] FDA and  has been authorized for detection and/or diagnosis of SARS-CoV-2 by FDA under an Emergency Use Authorization (EUA). This EUA will remain  in effect (meaning this test can be used) for the duration of the COVID-19 declaration under Section 564(b)(1) of the Act, 21 U.S.C.section 360bbb-3(b)(1), unless the authorization is terminated  or revoked sooner.       Influenza A by PCR NEGATIVE NEGATIVE Final   Influenza B by PCR NEGATIVE NEGATIVE Final    Comment: (NOTE) The Xpert Xpress SARS-CoV-2/FLU/RSV plus assay is intended as an aid in the diagnosis of influenza from Nasopharyngeal swab specimens and should not be used as a sole basis for treatment. Nasal washings and aspirates are unacceptable for  Xpert Xpress SARS-CoV-2/FLU/RSV testing.  Fact Sheet for Patients: EntrepreneurPulse.com.au  Fact Sheet for Healthcare Providers: IncredibleEmployment.be  This test is not yet approved or cleared by the [place] FDA and has been authorized for detection and/or diagnosis of SARS-CoV-2 by FDA under an Emergency Use Authorization (EUA). This EUA will remain in effect (meaning this test can be used) for the duration of the COVID-19 declaration under Section 564(b)(1) of the Act, 21 U.S.C. section 360bbb-3(b)(1), unless the authorization is terminated or revoked.  Performed at [facility], [place] [address] [place], [place] [address]   Urine Culture     Status: None   Collection Time: 01/05/22 10:19 PM   Specimen: Urine, Clean Catch  Result Value Ref Range Status   Specimen Description   Final    URINE, CLEAN CATCH Performed at [facility], [place] [address], [place], [place] [address]    Special Requests   Final    NONE Performed at [facility], [place] [address] [place], [place] [address]    Culture   Final    NO GROWTH Performed at [facility], [place] [address] [place], [place] [address]    Report Status 01/07/2022 FINAL  Final    Radiology Studies: No results found.    Prestin Munch T. [place]  If 7PM-7AM, please contact night-coverage [email or web address] 01/10/2022, 2:14 PM

## 2022-01-10 NOTE — Evaluation (Signed)
Physical Therapy Evaluation Patient Details Name: Melanie Lucas MRN: [id number] DOB: Jul 06, 1950 Today's Date: 01/10/2022  History of Present Illness  Melanie Lucas is a 71 yr old female admitted to the hospital 01-05-2022 with AMS and recent falls. She was found to have UTI and acute encephalopathy. PMH: OA, TIA, a fib, CVA, chronic diastolic CHF, lymphedema, COPD, CAD, deafness R ear, DM II, peripheral neuropathy  Clinical Impression  Pt was very lethargic, and slow to respond however with time she could state her name, and had a few 1 word responses with a lot of cues and stimuli and increased time to respond. Phonation is gargled and focus is to the left. Does respond and try to track you with her eyes but unsure if she really focus before gaze becomes to the left again.  A few intentional movement patterns but all others were PROM. Sat Edge of bed with assist with heave left lean. Tried right lean for balance strategies, however pt always wanted to find her eft side and lean for comfort. Pt with no balance recovery in sitting as well.   Unsure of pt's current baseline and how family was assisting her at home. At this time pt would be total care and hoyer lift for out of bed activities. We will continue to work with pt to see if attention and arousal improves to tasks.       Recommendations for follow up therapy are one component of a multi-disciplinary discharge planning process, led by the attending physician.  Recommendations may be updated based on patient status, additional functional criteria and insurance authorization.  Follow Up Recommendations Skilled nursing-short term rehab (<3 hours/day) (unsure of what assist level family can provide) Can patient physically be transported by private vehicle: No    Assistance Recommended at Discharge Frequent or constant Supervision/Assistance  Patient can return home with the following  A lot of help with walking and/or transfers;Two people to help  with walking and/or transfers;A lot of help with bathing/dressing/bathroom;Assistance with cooking/housework;Assistance with feeding;Assist for transportation;Help with stairs or ramp for entrance    Equipment Recommendations None recommended by PT (unsure of what pt has at home or will need)  Recommendations for Other Services       Functional Status Assessment Patient has had a recent decline in their functional status and demonstrates the ability to make significant improvements in function in a reasonable and predictable amount of time.     Precautions / Restrictions Precautions Precautions: Fall Restrictions Weight Bearing Restrictions: No Other Position/Activity Restrictions: deafness R ear      Mobility  Bed Mobility Overal bed mobility: Needs Assistance Bed Mobility: Rolling, Supine to Sit, Sit to Supine Rolling: Max assist   Supine to sit: Total assist Sit to supine: Max assist   General bed mobility comments: Pt assited a littel with rollign reaching slightly for railing. however supien to sit was total assist and even scooting to get hips in proper position to assist pt with upright balancing. Supien to sit, pt did reach for pillow with trunk and flexed knees in efforts to curl them up onto the bed, still requiring assist but participating slightly.    Transfers Overall transfer level:  (unable to attemtp due to poor sitting balance ( total assist) would be a hoyer lift at this time)                      Ambulation/Gait  Stairs            Wheelchair Mobility    Modified Rankin (Stroke Patients Only)       Balance Overall balance assessment: Needs assistance Sitting-balance support: Feet supported Sitting balance-Leahy Scale: Zero                                       Pertinent Vitals/Pain Pain Assessment Faces Pain Scale: Hurts a little bit (when moving pt from supine to sit EOB)    Home Living  Family/patient expects to be discharged to:: Unsure Living Arrangements: Children                 Additional Comments: unsure of PLOF and assit available at home at what level.    Prior Function Prior Level of Function : Patient poor historian/Family not available               ADLs Comments: The patient was unable to provide information regarding her prior level of functioning and living situation. Per old therapy notes from ~4 months ago, the pt lived with her child/children, used a rollator for ambulation, and was independent with ADLs.     Hand Dominance        Extremity/Trunk Assessment   Upper Extremity Assessment Upper Extremity Assessment: Difficult to assess due to impaired cognition (Pt presented with impaired command follow & decreased initiation. She was noted to demo gross grasp of both hands, however did not follow commands for further in-depth B UE assessment)    Lower Extremity Assessment Lower Extremity Assessment: RLE deficits/detail;LLE deficits/detail RLE Deficits / Details: PROM for full DF limited to neutral , all other ROM WFL, but all PROM with no volitional movement patterns except pt did respond to toe taps while feet on floor in sitting position. unable to fully assess due to pt's cognitive status LLE Deficits / Details: PROM for full DF limited to neutral , all other ROM WFL, but all PROM with no volitional movement patterns except pt did respond to toe taps while feet on floor in sitting position.  able to fully assess due to pt's cognitive status    Cervical / Trunk Assessment Cervical / Trunk Assessment: Other exceptions Cervical / Trunk Exceptions: no righting reactions for trunk control or muscle activity while in sitting, however able to sit statically if not pertubations, but unable to recover any sway from midline  Communication   Communication: Expressive difficulties  Cognition Arousal/Alertness: Lethargic Behavior During Therapy:  Flat affect Overall Cognitive Status: No family/caregiver present to determine baseline cognitive functioning Area of Impairment: Following commands, Attention                       Following Commands: Follows one step commands inconsistently     Problem Solving: Slow processing, Decreased initiation, Difficulty sequencing, Requires verbal cues General Comments: decreased initiation, suspected cognitive processsing deficits, minimal command follow        General Comments      Exercises     Assessment/Plan    PT Assessment Patient needs continued PT services (will trial to see if makes becomes more alert and participates for therapy.)  PT Problem List Decreased strength;Decreased range of motion;Decreased activity tolerance;Decreased balance;Decreased mobility       PT Treatment Interventions Functional mobility training;Therapeutic activities;Therapeutic exercise;Balance training;Neuromuscular re-education;Patient/family education    PT Goals (Current goals can be found in  the Care Plan section)  Acute Rehab PT Goals PT Goal Formulation: Patient unable to participate in goal setting Time For Goal Achievement: 01/24/22 Potential to Achieve Goals: Poor    Frequency Min 2X/week     Co-evaluation               AM-PAC PT "6 Clicks" Mobility  Outcome Measure Help needed turning from your back to your side while in a flat bed without using bedrails?: A Lot Help needed moving from lying on your back to sitting on the side of a flat bed without using bedrails?: A Lot Help needed moving to and from a bed to a chair (including a wheelchair)?: Total Help needed standing up from a chair using your arms (e.g., wheelchair or bedside chair)?: Total Help needed to walk in hospital room?: Total Help needed climbing 3-5 steps with a railing? : Total 6 Click Score: 8    End of Session   Activity Tolerance: Patient limited by lethargy Patient left: in bed;with bed alarm  set;with call bell/phone within reach Nurse Communication: Mobility status (would need a hoyer lift for out of bed activities) PT Visit Diagnosis: Muscle weakness (generalized) (M62.81);History of falling (Z91.81)    Time: 1110-1130 PT Time Calculation (min) (ACUTE ONLY): 20 min   Charges:   PT Evaluation $PT Eval Low Complexity: 1 Low PT Treatments $Therapeutic Activity: 8-22 mins        Gatha Mayer, PT, MPT Acute Rehabilitation Services Office: [phone] If a weekend: [facility] w/e pager [phone] 01/10/2022   Clide Dales 01/10/2022, 1:59 PM

## 2022-01-10 NOTE — Evaluation (Signed)
Occupational Therapy Evaluation Patient Details Name: Melanie Lucas MRN: [id number] DOB: Oct 17, 1950 Today's Date: 01/10/2022   History of Present Illness Melanie Lucas is a 70 yr old female admitted to the hospital 01-05-2022 with AMS and recent falls. She was found to have UTI and acute encephalopathy. PMH: OA, TIA, a fib, CVA, chronic diastolic CHF, lymphedema, COPD, CAD, deafness R ear, DM II, peripheral neuropathy   Clinical Impression   Patient was largely non-verbal during the session, except for stating her first name on 1 instance & presenting with a short garbled utterance on another instances She did not respond to yes/no questions, she presented with significantly impaired command follow, minimal initiation of tasks, flat affect, and minimal withdrawal responses to deep pressure applied to her bilateral toes. She appeared to have a L gaze preference, however further vision assessment may be warranted to confirm or refute this. At current, she appears to require total assist for all ADLs at bed level, as well as for bed mobility. Attempts at out of bed activity were deferred, given her aforementioned deficits. OT will trial the patient for therapy services for now, in order to determine if she can actively participate in the therapy process, and subsequently demonstrate functional progress from receiving skilled OT services.      Recommendations for follow up therapy are one component of a multi-disciplinary discharge planning process, led by the attending physician.  Recommendations may be updated based on patient status, additional functional criteria and insurance authorization.   Follow Up Recommendations  Skilled nursing-short term rehab (<3 hours/day)     Assistance Recommended at Discharge Frequent or constant Supervision/Assistance  Patient can return home with the following Direct supervision/assist for medications management;Two people to help with bathing/dressing/bathroom;Direct  supervision/assist for financial management;Assistance with cooking/housework;Assist for transportation;Assistance with feeding    Functional Status Assessment  Patient has had a recent decline in their functional status and demonstrates the ability to make significant improvements in function in a reasonable and predictable amount of time.  Equipment Recommendations  Other (comment) (to be determined pending pt functional presentation and progress)       Precautions / Restrictions Precautions Precautions: Fall Restrictions Weight Bearing Restrictions: No Other Position/Activity Restrictions: deafness R ear      Mobility Bed Mobility   Bed Mobility: Rolling Rolling: Total assist                     ADL either performed or assessed with clinical judgement   ADL Overall ADL's : Needs assistance/impaired Eating/Feeding: Total assistance;Bed level   Grooming: Total assistance;Bed level   Upper Body Bathing: Total assistance;Bed level   Lower Body Bathing: Total assistance;Bed level   Upper Body Dressing : Bed level;Total assistance   Lower Body Dressing: Bed level;Total assistance       Toileting- Clothing Manipulation and Hygiene: Bed level;Total assistance               Vision   Additional Comments: Pt appeared to demo a L gaze preference        Sensation  Patient presented with minimal to near absent withdrawal response to deep pressure/painful stimuli applied to her bilateral toes     Pertinent Vitals/Pain Pain Assessment Pain Assessment: Faces Pain Score: 0-No pain        Extremity/Trunk Assessment Upper Extremity Assessment Upper Extremity Assessment: Difficult to assess due to impaired cognition (Pt presented with impaired command follow & decreased initiation. She was noted to demo gross grasp of both  hands, however did not follow commands for further in-depth B UE assessment)   Lower Extremity Assessment Lower Extremity Assessment:  Difficult to assess due to impaired cognition (She was noted to bend her R knee after increased cueing was provided, however no intentional movement of the LLE was observed)       Communication     Cognition Arousal/Alertness: Lethargic Behavior During Therapy: Flat affect Overall Cognitive Status: No family/caregiver present to determine baseline cognitive functioning Area of Impairment: Following commands, Attention              General Comments: decreased initiation, suspected cognitive processsing deficits, minimal command follow     General Comments               Home Living Family/patient expects to be discharged to:: Unsure          Prior Functioning/Environment Prior Level of Function : Patient poor historian/Family not available               ADLs Comments: The patient was unable to provide information regarding her prior level of functioning and living situation. Per old therapy notes from ~4 months ago, the pt lived with her child/children, used a rollator for ambulation, and was independent with ADLs.        OT Problem List: Decreased strength;Decreased range of motion;Impaired balance (sitting and/or standing);Decreased cognition      OT Treatment/Interventions: Self-care/ADL training;Therapeutic exercise;Therapeutic activities;Neuromuscular education;Cognitive remediation/compensation;Energy conservation;Visual/perceptual remediation/compensation;DME and/or AE instruction;Patient/family education;Balance training    OT Goals(Current goals can be found in the care plan section) Acute Rehab OT Goals Patient Stated Goal: the pt did not verbalize this OT Goal Formulation: Patient unable to participate in goal setting Time For Goal Achievement: 01/24/22 Potential to Achieve Goals:  (Guarded) ADL Goals Pt Will Perform Eating: with min assist;sitting Pt Will Perform Grooming: with min assist;sitting Pt Will Perform Upper Body Dressing: with min  assist;sitting Additional ADL Goal #1: The patient will perform bed mobility with min assist, in prep for progressive ADL participation.  OT Frequency: Min 2X/week       AM-PAC OT "6 Clicks" Daily Activity     Outcome Measure Help from another person eating meals?: Total Help from another person taking care of personal grooming?: Total Help from another person toileting, which includes using toliet, bedpan, or urinal?: Total Help from another person bathing (including washing, rinsing, drying)?: Total Help from another person to put on and taking off regular upper body clothing?: Total Help from another person to put on and taking off regular lower body clothing?: Total 6 Click Score: 6   End of Session Nurse Communication: Mobility status  Activity Tolerance: Other (comment) (patient limited by impaired cognition) Patient left: in bed;with call bell/phone within reach;with bed alarm set  OT Visit Diagnosis: Muscle weakness (generalized) (M62.81);History of falling (Z91.81)                Time: 4166-0630 OT Time Calculation (min): 15 min Charges:  OT General Charges $OT Visit: 1 Visit OT Evaluation $OT Eval Moderate Complexity: 1 Mod    Donyae Kohn L Brandi Tomlinson, OTR/L 01/10/2022, 10:57 AM

## 2022-01-10 NOTE — Consult Note (Signed)
Neurology Consultation Reason for Consult: Altered mental status Referring Physician: Cyndia Skeeters, T  CC: Altered mental status  History is obtained from: Daughter  HPI: Melanie Lucas is a 71 y.o. female with a history of multiple previous strokes and falls who at baseline requires significant help to move around, and has slowed responses but is able to communicate.  She apparently has a left gaze preference at baseline, but is typically able to cross to the right.  She was in her normal state of health last week, but then on Wednesday had a fall.  She went to the emergency department and was evaluated, and was doing well immediately after the fall, but was not doing quite as well on Thursday.  On Friday, she was having significant difficulties and re presented to the emergency department.  Initially there is some thought for postconcussion syndrome versus UTI and she was admitted for further workup.  She was treated with ceftriaxone for 3 days.  That workup has been essentially negative, but she continues to be very confused, and quite far from her baseline.  She does have intermittent episodes where she seems to not hear her daughter.  Due to lack of improvement, neurology has been consulted for further recommendations.   Past Medical History:  Diagnosis Date   Anxiety    Cancer ([place])    Skin- leg    Chronic acquired lymphedema    a. R>L;  b. 03/2012 Neg LE U/S for DVT. Legs since age 55   Complication of anesthesia    woke up during colonoscopy, Lithrostrixpy, Biospy   COPD (chronic obstructive pulmonary disease) ([place])    Coronary artery disease    Deaf, right    Diabetes mellitus    Type II   Diabetic neuropathy ([place])    Elevated TSH    a. 10/2012 - inst to f/u PCP.   Full dentures    Hematuria    a. while on pradaxa,  she reports that she has seen Dr Margie Ege and had low risk cystoscopy   History of kidney stones    Hyperlipidemia    Hypertension    patient denies   Incontinence     prior to urinating   Lacunar infarction [facility])    a. 02/2009 non-acute Lacunar infarct of the right thalamus noted on MRI of brain.   Left carotid stenosis    Lymphedema of extremity    had this problem, since she was a teenager. especially seen in R LE   Meniere disease    Myocardial infarction [facility])    Obesity    Osteoarthritis    cervical & lumbar region, knees, hands cramp also    PAF with post-termination pauses    a. on dronedarone;  b. CHA2DS2VASc = 5 (HTN, DM, h/o lacunar infarct on MRI, Female) ->refused oral anticoagulation after h/o hematuria on pradaxa;  c. 02/2012 Echo: EF 55-60%, mildly dil LA. D. Recurrent PAF 10/2012 after only taking Multaq 1x/day - spont conv to NSR, placed back on BID Multaq/eliquis;  e. 10/2014 Multaq d/c'd->tikosyn initiated.   Shortness of breath dyspnea    with exertion   Stroke [facility])    2 mini strokes   Tobacco abuse    Vision abnormalities      Family History  Problem Relation Age of Onset   Heart attack Father    Coronary artery disease Father        strong family hx   Bladder Cancer Mother  bladder   Leukemia Brother    Breast cancer Maternal Aunt    Breast cancer Paternal Aunt    Breast cancer Maternal Aunt      Social History:  reports that she has been smoking cigarettes. She has a 55.00 pack-year smoking history. She has never used smokeless tobacco. She reports current alcohol use of about 1.0 standard drink of alcohol per week. She reports that she does not use drugs.   Exam: Current vital signs: BP 122/71 (BP Location: Right Arm)   Pulse 72   Temp 98.5 F (36.9 C) (Oral)   Resp 17   Ht '5\' 7"'$  (1.702 m)   Wt 56.6 kg   SpO2 95%   BMI 19.54 kg/m  Vital signs in last 24 hours: Temp:  [97.5 F (36.4 C)-98.8 F (37.1 C)] 98.5 F (36.9 C) (11/22 1356) Pulse Rate:  [65-77] 72 (11/22 1356) Resp:  [17-19] 17 (11/22 1356) BP: (122-150)/(71-91) 122/71 (11/22 1356) SpO2:  [95 %-100 %] 95 % (11/22 1356) Weight:  [56.6  kg] 56.6 kg (11/22 0500)   Physical Exam  Constitutional: Appears well-developed and well-nourished.  Psych: Affect appropriate to situation Eyes: No scleral injection HENT: No OP obstruction MSK: no joint deformities.  Cardiovascular: Normal rate and regular rhythm.  Respiratory: Effort normal, non-labored breathing GI: Soft.  No distension. There is no tenderness.  Skin: WDI  Neuro: Mental Status: Patient is awake, she is able to tell me her name, able to answer some simple questions, but only with one or two words.  She follows simple commands, but not more complex ones. Cranial Nerves: II: She counts fingers in all visual fields. Pupils are equal, round, and reactive to light.   III,IV, VI: She will not cross midline to the right, but will come to the midline. V: Facial sensation is symmetric to temperature VII: Facial movement is symmetric.  Eyes his right VIII: hearing is intact to voice X: Uvula elevates symmetrically XI: Shoulder shrug is symmetric. XII: tongue is midline without atrophy or fasciculations.  Motor: She keeps her left arm tight against her chest, but is able to lift it against gravity, moves her right arm well.  She does have 4/5 weakness of the left lower extremity Sensory: She response to noxious stimulation in all four extremities Deep Tendon Reflexes: She is hyperreflexic in the left leg, with several beats of clonus at left ankle Cerebellar: Does not perform     I have reviewed labs in epic and the results pertinent to this consultation are: TSH 4.1 Free T41.19 Folate 28 B12 845 Ammonia 17 Urine culture negative x 2  MRI brain-no acute findings EEG-intermittent slowing   Impression: 71 year old female with acute on chronic encephalopathy with unclear reason for worsening.  She apparently always has a left sided preference per her daughter, but it is worse than typical.  Possibilities include some underlying physiological stressor that is  as yet unidentified such as a viral infection or other physiological stress causing worsening of her underlying deficits.  I am concerned with her gaze palsy as well as altered mental status, that Wernicke's encephalopathy probably needs to be considered even though there is no strong history of weight loss.  I will start her on high-dose thiamine.  Response to this is typically fairly rapid, if she is not significantly better, then I would favor transporting her to [facility] for consideration of continuous EEG.  If all workup is negative, I have seen concussions have outsized effects in people  with poor neurological protoplasm, which is definitely the case in her.  It is possible that this represents a delirium secondary to concussion.  Recommendations: 1) thiamine 500 mg 3 times daily, after level 2) if no improvement, consider transfer to [facility] for LTM EEG 3) neurology will continue to follow  Roland Rack, MD [facility] [phone]  If 7pm- 7am, please page neurology on call as listed in [place].

## 2022-01-11 DIAGNOSIS — I10 Essential (primary) hypertension: Secondary | ICD-10-CM | POA: Diagnosis not present

## 2022-01-11 DIAGNOSIS — G9340 Encephalopathy, unspecified: Secondary | ICD-10-CM | POA: Diagnosis not present

## 2022-01-11 DIAGNOSIS — I480 Paroxysmal atrial fibrillation: Secondary | ICD-10-CM | POA: Diagnosis not present

## 2022-01-11 DIAGNOSIS — I5032 Chronic diastolic (congestive) heart failure: Secondary | ICD-10-CM | POA: Diagnosis not present

## 2022-01-11 LAB — CBC
HCT: 47.7 % — ABNORMAL HIGH (ref 36.0–46.0)
Hemoglobin: 15.8 g/dL — ABNORMAL HIGH (ref 12.0–15.0)
MCH: 30.7 pg (ref 26.0–34.0)
MCHC: 33.1 g/dL (ref 30.0–36.0)
MCV: 92.8 fL (ref 80.0–100.0)
Platelets: 300 10*3/uL (ref 150–400)
RBC: 5.14 MIL/uL — ABNORMAL HIGH (ref 3.87–5.11)
RDW: 13.3 % (ref 11.5–15.5)
WBC: 6.5 10*3/uL (ref 4.0–10.5)
nRBC: 0 % (ref 0.0–0.2)

## 2022-01-11 LAB — RENAL FUNCTION PANEL
Albumin: 3.4 g/dL — ABNORMAL LOW (ref 3.5–5.0)
Anion gap: 9 (ref 5–15)
BUN: 27 mg/dL — ABNORMAL HIGH (ref 8–23)
CO2: 22 mmol/L (ref 22–32)
Calcium: 8.7 mg/dL — ABNORMAL LOW (ref 8.9–10.3)
Chloride: 109 mmol/L (ref 98–111)
Creatinine, Ser: 0.68 mg/dL (ref 0.44–1.00)
GFR, Estimated: >60 mL/min (ref >60)
Glucose, Bld: 153 mg/dL — ABNORMAL HIGH (ref 70–99)
Phosphorus: 3.4 mg/dL (ref 2.5–4.6)
Potassium: 4.1 mmol/L (ref 3.5–5.1)
Sodium: 140 mmol/L (ref 135–145)

## 2022-01-11 LAB — GLUCOSE, CAPILLARY
Glucose-Capillary: 161 mg/dL — ABNORMAL HIGH (ref 70–99)
Glucose-Capillary: 168 mg/dL — ABNORMAL HIGH (ref 70–99)
Glucose-Capillary: 212 mg/dL — ABNORMAL HIGH (ref 70–99)
Glucose-Capillary: 199 mg/dL — ABNORMAL HIGH (ref 70–99)
Glucose-Capillary: 273 mg/dL — ABNORMAL HIGH (ref 70–99)

## 2022-01-11 LAB — MAGNESIUM: Magnesium: 2.4 mg/dL (ref 1.7–2.4)

## 2022-01-11 NOTE — Progress Notes (Signed)
PROGRESS NOTE  Melanie Lucas BSW:[id number] DOB: [birth year]   PCP: Sue Lush, PA-C  Patient is from: Home.  Uses rolling walker at baseline.  DOA: 01/05/2022 LOS: 5  Chief complaints Chief Complaint  Patient presents with   Altered Mental Status     Brief Narrative / Interim history: 71 year old F with PMH of COPD, CAD, Mnire's disease with right ear deafness, type II DM, neuropathy, left carotid stenosis, HLD, osteoporosis, paroxysmal A-fib on Tikosyn/Eliquis, anxiety, depression and former smoker presented to the ER with complaints of confusion, decreased oral intake, decreased interaction and excessive sleepiness, and admitted for acute encephalopathy and possible concussion syndrome.  Previously seen in ED on 10/14 for cystitis and near syncope.  Had another ED visit on 10/14 for fall.  CT head and C-spine without significant finding.  Return to ED on 11/17 due to the above complaints.    Encephalopathy work-up including CT head, MRI brain, B12, TSH, free T4, ammonia and VBG unrevealing. Completed 3 days of IV antibiotics for possible cystitis but no improvement in mental status. EEG negative for seizure but encephalopathy.  Neurology consulted on 11/22, and started high-dose thiamine and recommended transfer to [facility] for LTM EEG.  Subjective: Seen and examined earlier this morning.  No major events overnight of this morning.  Awake but remains confused.  Follows some commands.  Objective: Vitals:   01/10/22 1356 01/10/22 2043 01/11/22 0500 01/11/22 0605  BP: 122/71 134/74  122/83  Pulse: 72 66  67  Resp: '17 18  15  '$ Temp: 98.5 F (36.9 C) (!) 97.5 F (36.4 C)  (!) 97.4 F (36.3 C)  TempSrc: Oral Oral  Oral  SpO2: 95% 94%  95%  Weight:   56.9 kg   Height:        Examination:   GENERAL: No apparent distress.  HEENT: MMM.  Vision and hearing grossly intact.  NECK: Supple.  No apparent JVD.  RESP:  No IWOB.  Fair aeration bilaterally. CVS:  RRR. Heart sounds  normal.  ABD/GI/GU: BS+. Abd soft, NTND.  MSK/EXT:  Moves extremities. No apparent deformity. No edema.  SKIN: no apparent skin lesion or wound NEURO: Awake.  Follows some commands.  Seems to have left gaze.  PERRL.  No facial asymmetry.  Moves extremities.  PSYCH: Calm.  No distress or agitation.  Procedures:  None  Microbiology summarized: 11/17-COVID-19 and influenza PCR nonreactive 11/17-urine cultures NGTD  Assessment and plan: Principal Problem:   Acute encephalopathy Active Problems:   Atrial fibrillation (HCC)   Acute metabolic encephalopathy   Chronic diastolic CHF (congestive heart failure) (HCC)   Essential hypertension, benign   Coronary artery disease involving native coronary artery of native heart without angina pectoris   Pulmonary hypertension (HCC)   Type 2 diabetes mellitus without complication, without long-term current use of insulin (HCC)   COPD (chronic obstructive pulmonary disease) (HCC)   Depression   Memory loss   Acute cystitis  Acute encephalopathy/possible concussion: Per family, walks with walker, feed herself, requires assistance for bathing, has slow speech with some dysarthria/expressive aphasia at baseline.  She has worsening left gaze.  Encephalopathy work-up including CT head, MRI brain, B12, TSH, ammonia and blood gas negative.  EEG showed encephalopathy but no seizure or epileptiform discharge. Reportedly had a fall on 11/14 raising concern for possible concussion syndrome. -Neurology following-started high-dose thiamine recommended transfer to [facility] for LTM EEG -Fall and delirium precaution -Follow-up thiamine level    Possible UTI: Urine culture negative.  Treated with ceftriaxone for 3 days -Continue home Macrobid for history of recurrent UTI   Chronic A-fib -Continue home Tikosyn and Eliquis -Optimize electrolytes   Uncontrolled IDDM-2 with hyperglycemia: A1c 7.0% on 11/19. Recent Labs  Lab 01/10/22 1156 01/10/22 1643  01/10/22 2141 01/11/22 0636 01/11/22 0724  GLUCAP 164* 306* 150* 161* 168*  -Continue current insulin regimen and statin  History of chronic diastolic CHF: Appears euvolemic. -Continue holding home Lasix -Monitor fluid status  History of CAD/mild troponin elevation: EKG showed RBBB and LAFB without any significant ST-T wave abnormalities consistent with ACS. Troponin x2-22 and 23.  Abnormal but not consistent with ACS pattern.  No major events on telemetry. -Continue home meds   Osteoporosis. -Recently received Reclast prior to admission.  Essential hypertension: Normotensive. -Continue home losartan.   Mood disorder. -Resume home Cymbalta.  Overactive bladder. -Continue home Myrbetriq   Dysphagia: Likely from fatigue and encephalopathy. -On full liquid diet per SLP -Aspiration precaution   Mnire's disease. Monitor.   History of COPD: Stable.   Renal cyst: 12 mm right kidney cyst noted on CT.  Unchanged from 2021. -Renal MRI protocol outpatient recommended.   Body mass index is 19.65 kg/m. -Consult dietitian  DVT prophylaxis:  SCDs Start: 01/06/22 0123 apixaban (ELIQUIS) tablet 5 mg  Code Status: Full code Family Communication: Updated patient's daughter, [name] over the phone Level of care: Med-Surg Status is: Inpatient Remains inpatient appropriate because: Acute encephalopathy   Final disposition: TBD Consultants:  Neurology  Sch Meds:  Scheduled Meds:  apixaban  5 mg Oral BID   dofetilide  250 mcg Oral BID   DULoxetine  30 mg Oral Daily   feeding supplement  237 mL Oral TID BM   insulin aspart  0-5 Units Subcutaneous QHS   insulin aspart  0-6 Units Subcutaneous TID WC   losartan  50 mg Oral Daily   mirabegron ER  50 mg Oral QHS   multivitamin with minerals  1 tablet Oral Daily   mouth rinse  15 mL Mouth Rinse 4 times per day   potassium chloride SA  20 mEq Oral Daily   [START ON 01/19/2022] thiamine (VITAMIN B1) injection  100 mg Intravenous  Daily   Continuous Infusions:  thiamine (VITAMIN B1) injection 500 mg (01/11/22 0524)   Followed by   Derrill Memo ON 01/13/2022] thiamine (VITAMIN B1) injection     PRN Meds:.acetaminophen **OR** acetaminophen, food thickener, hydrALAZINE, mouth rinse  Antimicrobials: Anti-infectives (From admission, onward)    Start     Dose/Rate Route Frequency Ordered Stop   01/06/22 2200  cefTRIAXone (ROCEPHIN) 1 g in sodium chloride 0.9 % 100 mL IVPB  Status:  Discontinued        1 g 200 mL/hr over 30 Minutes Intravenous Every 24 hours 01/06/22 0851 01/08/22 0902   01/06/22 1700  fluconazole (DIFLUCAN) tablet 150 mg        150 mg Oral  Once 01/06/22 1407 01/06/22 1724   01/05/22 2230  cefTRIAXone (ROCEPHIN) 1 g in sodium chloride 0.9 % 100 mL IVPB        1 g 200 mL/hr over 30 Minutes Intravenous  Once 01/05/22 2216 01/06/22 0122        I have personally reviewed the following labs and images: CBC: Recent Labs  Lab 01/05/22 1856 01/06/22 0550 01/11/22 0543  WBC 6.1 6.9 6.5  NEUTROABS 4.0 4.8  --   HGB 15.5* 14.8 15.8*  HCT 46.2* 43.7 47.7*  MCV 92.6 92.8 92.8  PLT 237  224 300   BMP &GFR Recent Labs  Lab 01/05/22 1856 01/06/22 0550 01/07/22 0847 01/07/22 1535 01/08/22 0620 01/09/22 0534 01/10/22 0524 01/11/22 0543  NA 142 140 141 143 143 139 140 140  K 3.5 3.5 3.4* 4.0 4.4 3.6 4.2 4.1  CL 109 109 107 109 107 107 111 109  CO2 24 21* 18* '22 23 23 '$ 21* 22  GLUCOSE 145* 119* 125* 144* 99 114* 143* 153*  BUN '18 15 19 20 20 19 '$ 24* 27*  CREATININE 0.62 0.44 0.50 0.70 0.48 0.53 0.46 0.68  CALCIUM 9.1 8.1* 8.7* 8.6* 8.6* 8.6* 8.5* 8.7*  MG 2.1 2.1 2.2  --   --   --   --  2.4  PHOS  --   --   --   --   --   --   --  3.4   Estimated Creatinine Clearance: 56.3 mL/min (by C-G formula based on SCr of 0.68 mg/dL). Liver & Pancreas: Recent Labs  Lab 01/05/22 1856 01/06/22 0550 01/11/22 0543  AST 18 17  --   ALT 17 15  --   ALKPHOS 70 68  --   BILITOT 1.0 1.1  --   PROT 6.6 6.3*   --   ALBUMIN 3.8 3.4* 3.4*   No results for input(s): "LIPASE", "AMYLASE" in the last 168 hours. Recent Labs  Lab 01/05/22 1943  AMMONIA 17   Diabetic: No results for input(s): "HGBA1C" in the last 72 hours.  Recent Labs  Lab 01/10/22 1156 01/10/22 1643 01/10/22 2141 01/11/22 0636 01/11/22 0724  GLUCAP 164* 306* 150* 161* 168*   Cardiac Enzymes: No results for input(s): "CKTOTAL", "CKMB", "CKMBINDEX", "TROPONINI" in the last 168 hours. No results for input(s): "PROBNP" in the last 8760 hours. Coagulation Profile: No results for input(s): "INR", "PROTIME" in the last 168 hours. Thyroid Function Tests: No results for input(s): "TSH", "T4TOTAL", "FREET4", "T3FREE", "THYROIDAB" in the last 72 hours.  Lipid Profile: No results for input(s): "CHOL", "HDL", "LDLCALC", "TRIG", "CHOLHDL", "LDLDIRECT" in the last 72 hours. Anemia Panel: No results for input(s): "VITAMINB12", "FOLATE", "FERRITIN", "TIBC", "IRON", "RETICCTPCT" in the last 72 hours.  Urine analysis:    Component Value Date/Time   COLORURINE YELLOW 01/05/2022 2036   APPEARANCEUR HAZY (A) 01/05/2022 2036   LABSPEC 1.033 (H) 01/05/2022 2036   PHURINE 5.0 01/05/2022 2036   GLUCOSEU >=500 (A) 01/05/2022 2036   HGBUR NEGATIVE 01/05/2022 2036   BILIRUBINUR NEGATIVE 01/05/2022 2036   BILIRUBINUR negative 10/17/2015 1839   KETONESUR 20 (A) 01/05/2022 2036   PROTEINUR NEGATIVE 01/05/2022 2036   UROBILINOGEN 0.2 10/17/2015 1839   UROBILINOGEN 1.0 09/20/2010 0025   NITRITE NEGATIVE 01/05/2022 2036   LEUKOCYTESUR MODERATE (A) 01/05/2022 2036   Sepsis Labs: Invalid input(s): "PROCALCITONIN", "LACTICIDVEN"  Microbiology: Recent Results (from the past 240 hour(s))  Urine Culture     Status: None   Collection Time: 01/02/22  4:30 PM   Specimen: Urine, Catheterized  Result Value Ref Range Status   Specimen Description URINE, CATHETERIZED  Final   Special Requests NONE  Final   Culture   Final    NO GROWTH Performed  at [facility], [address], [place], [place] [address]    Report Status 01/03/2022 FINAL  Final  Resp Panel by RT-PCR (Flu A&B, Covid) Anterior Nasal Swab     Status: None   Collection Time: 01/05/22  6:57 PM   Specimen: Anterior Nasal Swab  Result Value Ref Range Status   SARS Coronavirus  2 by RT PCR NEGATIVE NEGATIVE Final    Comment: (NOTE) SARS-CoV-2 target nucleic acids are NOT DETECTED.  The SARS-CoV-2 RNA is generally detectable in upper respiratory specimens during the acute phase of infection. The lowest concentration of SARS-CoV-2 viral copies this assay can detect is 138 copies/mL. A negative result does not preclude SARS-Cov-2 infection and should not be used as the sole basis for treatment or other patient management decisions. A negative result may occur with  improper specimen collection/handling, submission of specimen other than nasopharyngeal swab, presence of viral mutation(s) within the areas targeted by this assay, and inadequate number of viral copies(<138 copies/mL). A negative result must be combined with clinical observations, patient history, and epidemiological information. The expected result is Negative.  Fact Sheet for Patients:  EntrepreneurPulse.com.au  Fact Sheet for Healthcare Providers:  IncredibleEmployment.be  This test is no t yet approved or cleared by the [place] FDA and  has been authorized for detection and/or diagnosis of SARS-CoV-2 by FDA under an Emergency Use Authorization (EUA). This EUA will remain  in effect (meaning this test can be used) for the duration of the COVID-19 declaration under Section 564(b)(1) of the Act, 21 U.S.C.section 360bbb-3(b)(1), unless the authorization is terminated  or revoked sooner.       Influenza A by PCR NEGATIVE NEGATIVE Final   Influenza B by PCR NEGATIVE NEGATIVE Final    Comment: (NOTE) The Xpert Xpress SARS-CoV-2/FLU/RSV plus assay is  intended as an aid in the diagnosis of influenza from Nasopharyngeal swab specimens and should not be used as a sole basis for treatment. Nasal washings and aspirates are unacceptable for Xpert Xpress SARS-CoV-2/FLU/RSV testing.  Fact Sheet for Patients: EntrepreneurPulse.com.au  Fact Sheet for Healthcare Providers: IncredibleEmployment.be  This test is not yet approved or cleared by the [place] FDA and has been authorized for detection and/or diagnosis of SARS-CoV-2 by FDA under an Emergency Use Authorization (EUA). This EUA will remain in effect (meaning this test can be used) for the duration of the COVID-19 declaration under Section 564(b)(1) of the Act, 21 U.S.C. section 360bbb-3(b)(1), unless the authorization is terminated or revoked.  Performed at [facility], [place] [address], [place], [place] [address]   Urine Culture     Status: None   Collection Time: 01/05/22 10:19 PM   Specimen: Urine, Clean Catch  Result Value Ref Range Status   Specimen Description   Final    URINE, CLEAN CATCH Performed at [facility], [place] [address] [place], [place] [address]    Special Requests   Final    NONE Performed at [facility], [place] [address] [place], [place] [address]    Culture   Final    NO GROWTH Performed at [facility], [place] [address] [place], [place] [address]    Report Status 01/07/2022 FINAL  Final    Radiology Studies: No results found.    Landen Breeland T. [place]  If 7PM-7AM, please contact night-coverage [email or web address] 01/11/2022, 11:17 AM

## 2022-01-11 NOTE — Progress Notes (Signed)
Neurology Progress Note  Brief HPI: 71 year old patient with history of multiple strokes and left gaze preference presents after falling on Wednesday of last week.  Patient was evaluated and released from the ER after this fall, but had to be having more difficulties on Friday, represented to the ED emergency department and was treated for a UTI.  Her work-up has been negative, however her mental status has not returned to her baseline.  Subjective: Patient's vital signs have been stable, she has no complaints and is oriented to name only  Exam: Vitals:   01/10/22 2043 01/11/22 0605  BP: 134/74 122/83  Pulse: 66 67  Resp: 18 15  Temp: (!) 97.5 F (36.4 C) (!) 97.4 F (36.3 C)  SpO2: 94% 95%   Gen: In bed, NAD Resp: non-labored breathing, no acute distress  Neuro: Mental Status: Drowsy, able to state name, follows simple commands intermittently Cranial Nerves: Pupils equal with left gaze deviation, unable to cross midline, face appears symmetrical tongue protrusion not cooperative hearing appears intact to voice Motor: Moves all extremities not purposefully, will move upper extremities to command withdraw lower extremities to noxious stimuli Sensory: Appears intact to light touch through and noxious stimuli throughout DTR: 2+ throughout Gait: Deferred  Pertinent Labs:    Latest Ref Rng & Units 01/11/2022    5:43 AM 01/06/2022    5:50 AM 01/05/2022    6:56 PM  CBC  WBC 4.0 - 10.5 K/uL 6.5  6.9  6.1   Hemoglobin 12.0 - 15.0 g/dL 15.8  14.8  15.5   Hematocrit 36.0 - 46.0 % 47.7  43.7  46.2   Platelets 150 - 400 K/uL 300  224  237        Latest Ref Rng & Units 01/11/2022    5:43 AM 01/10/2022    5:24 AM 01/09/2022    5:34 AM  BMP  Glucose 70 - 99 mg/dL 153  143  114   BUN 8 - 23 mg/dL '27  24  19   '$ Creatinine 0.44 - 1.00 mg/dL 0.68  0.46  0.53   Sodium 135 - 145 mmol/L 140  140  139   Potassium 3.5 - 5.1 mmol/L 4.1  4.2  3.6   Chloride 98 - 111 mmol/L 109  111  107    CO2 22 - 32 mmol/L '22  21  23   '$ Calcium 8.9 - 10.3 mg/dL 8.7  8.5  8.6     TSH 4.1 Free T41.19 Folate 28 B12 845 Ammonia 17 Thiamine pending  Imaging Reviewed:  CT head: No acute abnormality, atrophy and chronic small vessel disease  MRI brain: No acute abnormality   Assessment: 71 year old patient with history of multiple strokes who requires significant assistance at baseline presents after confusion which occurred after a fall that occurred on Wednesday.  Patient has been treated for a UTI, and CT and MRI have been performed and it showed no acute changes.  There is concern for Warnicke's encephalopathy given poor p.o. intake, and thiamine is pending.  High-dose thiamine supplementation has been started.  There is concern for seizure activity, so will transfer patient to [facility] for continuous EEG.  It is also possible that her condition is the result of toxic metabolic encephalopathy or postconcussion delirium.  Impression: Wernicke's encephalopathy versus toxic metabolic encephalopathy versus postconcussion delirium in patient with multiple strokes  Recommendations: 1) continue high-dose thiamine 2) transfer to [facility] for continuous EEG  [place] , MSN, AGACNP-BC  [facility] See Amion for schedule and pager information 01/11/2022 9:20 AM    Ms Egelston continues to be confused more than typical.  I do think that it would be beneficial to transfer her to [facility] for continuous EEG monitoring.  Neurology will continue to follow.  Roland Rack, MD [facility] [phone]  If 7pm- 7am, please page neurology on call as listed in [place].

## 2022-01-12 ENCOUNTER — Inpatient Hospital Stay (HOSPITAL_COMMUNITY): Payer: Medicare PPO

## 2022-01-12 DIAGNOSIS — I480 Paroxysmal atrial fibrillation: Secondary | ICD-10-CM | POA: Diagnosis not present

## 2022-01-12 DIAGNOSIS — G9340 Encephalopathy, unspecified: Secondary | ICD-10-CM | POA: Diagnosis not present

## 2022-01-12 DIAGNOSIS — I10 Essential (primary) hypertension: Secondary | ICD-10-CM | POA: Diagnosis not present

## 2022-01-12 LAB — GLUCOSE, CAPILLARY
Glucose-Capillary: 170 mg/dL — ABNORMAL HIGH (ref 70–99)
Glucose-Capillary: 247 mg/dL — ABNORMAL HIGH (ref 70–99)
Glucose-Capillary: 322 mg/dL — ABNORMAL HIGH (ref 70–99)
Glucose-Capillary: 390 mg/dL — ABNORMAL HIGH (ref 70–99)
Glucose-Capillary: 323 mg/dL — ABNORMAL HIGH (ref 70–99)

## 2022-01-12 NOTE — Progress Notes (Signed)
LTM EEG hooked up and running - no initial skin breakdown - push button tested - [facility] monitoring.  

## 2022-01-12 NOTE — Progress Notes (Signed)
Fixed F7 placed head wrap on patient

## 2022-01-12 NOTE — Progress Notes (Signed)
SLP Cancellation Note  Patient Details Name: Melanie Lucas MRN: [id number] DOB: October 03, 1950   Cancelled treatment:       Reason Eval/Treat Not Completed: Patient at procedure or test/unavailable. MBS cancelled due to continuous EEG.    Geary Rufo, Adri Schloss 01/12/2022, 10:26 AM

## 2022-01-12 NOTE — Progress Notes (Signed)
Initial Nutrition Assessment  DOCUMENTATION CODES:   Not applicable  INTERVENTION:  - Continue Ensure Enlive po BID, each supplement provides 350 kcal and 20 grams of protein.   - Add Magic cup TID with meals, each supplement provides 290 kcal and 9 grams of protein  NUTRITION DIAGNOSIS:   Inadequate oral intake related to lethargy/confusion, dysphagia as evidenced by other (comment) (full liquid diet).  GOAL:   Patient will meet greater than or equal to 90% of their needs  MONITOR:   PO intake, Supplement acceptance  REASON FOR ASSESSMENT:   Consult Assessment of nutrition requirement/status  ASSESSMENT:   71 y.o. female admits related to AMS. PMH includes: anxiety, depression, CHF, lymphedema, COPD, CAD, T2DM, diabetic peripheral neuropathy ,HLD, HTN. Pt is currently receiving medical management for acute encephalopathy/possible conclusion.  Meds includes: sliding scale insulin, cozaar, MVI, Klor-con, Thiamine. Labs reviewed.   The pt is currently disoriented x4. RN states in her note that the pt does not answer questions. Per record, pt has lost about 18 lbs in 1 week per record, some of this is likely fluid related. Pt has Ensure TID ordered. Per record, pt has been eating mostly 50-100% of her meals since admission, with the exception of a few meals </= 25%. RD will also add magic cup with meals and continue to monitor PO intakes.   NUTRITION - FOCUSED PHYSICAL EXAM:  Pt disoriented x4; not appropriate for NFPE at this time.   Diet Order:   Diet Order             Diet full liquid Room service appropriate? Yes; Fluid consistency: Nectar Thick  Diet effective now                   EDUCATION NEEDS:   Not appropriate for education at this time  Skin:  Skin Assessment: Reviewed RN Assessment  Last BM:  01/09/22  Height:   Ht Readings from Last 1 Encounters:  01/06/22 '5\' 7"'$  (1.702 m)    Weight:   Wt Readings from Last 1 Encounters:  01/11/22 56.9  kg    Ideal Body Weight:  61.3 kg  BMI:  Body mass index is 19.65 kg/m.  Estimated Nutritional Needs:   Kcal:  1700-1990 kcals  Protein:  85-100 gm  Fluid:  >/= 1.7 L  Thalia Bloodgood, RD, LDN, CNSC

## 2022-01-12 NOTE — TOC Progression Note (Signed)
Transition of Care [facility]) - Progression Note    Patient Details  Name: Melanie Lucas MRN: [id number] Date of Birth: [birth year]  Transition of Care [facility]) CM/SW [place], [place] Phone Number: 01/12/2022, 2:26 PM  Clinical Narrative:    CSW spoke with [facility], pt's first choice, and requested they review referral. [facility] states they are reviewing and will follow up with determination. TOC will continue to follow for DC needs.        Expected Discharge Plan and Services                                                 Social Determinants of Health (SDOH) Interventions Food Insecurity Interventions: Intervention Not Indicated Housing Interventions: Intervention Not Indicated Transportation Interventions: Intervention Not Indicated Utilities Interventions: Intervention Not Indicated  Readmission Risk Interventions    01/10/2022    3:00 PM 08/01/2021   10:59 AM  Readmission Risk Prevention Plan  Transportation Screening Complete Complete  PCP or Specialist Appt within 5-7 Days  Complete  PCP or Specialist Appt within 3-5 Days Complete   Home Care Screening  Complete  Medication Review (RN CM)  Complete  HRI or Home Care Consult Complete   Social Work Consult for Ages Planning/Counseling Complete   Palliative Care Screening Not Applicable   Medication Review Press photographer) Complete

## 2022-01-12 NOTE — Progress Notes (Signed)
[facility] PROGRESS NOTE   Melanie Lucas VQQ:[id number] DOB: 1950-12-12 DOA: 01/05/2022  PCP: Sue Lush, PA-C  Brief History/Interval Summary: 71 year old F with PMH of COPD, CAD, Mnire's disease with right ear deafness, type II DM, neuropathy, left carotid stenosis, HLD, osteoporosis, paroxysmal A-fib on Tikosyn/Eliquis, anxiety, depression and former smoker presented to the ER with complaints of confusion, decreased oral intake, decreased interaction and excessive sleepiness, and admitted for acute encephalopathy and possible concussion syndrome. Encephalopathy work-up including CT head, MRI brain, B12, TSH, free T4, ammonia and VBG unrevealing. Completed 3 days of IV antibiotics for possible cystitis but no improvement in mental status. EEG negative for seizure but encephalopathy.  Neurology consulted on 11/22, and started high-dose thiamine and recommended transfer to [facility] for LTM EEG.   Consultants: Neurology  Procedures: Continuous EEG    Subjective/Interval History: Patient with leftward gaze.  Does not respond to questions.    Assessment/Plan:  Acute encephalopathy/possible concussion Per family, at baseline patient walks with walker, feed herself, requires assistance for bathing, has slow speech with some dysarthria/expressive aphasia at baseline.  She has worsening left gaze.   Encephalopathy work-up including CT head, MRI brain, B12, TSH, ammonia and blood gas negative.   EEG showed encephalopathy but no seizure or epileptiform discharge.  Reportedly had a fall on 11/14 raising concern for possible concussion syndrome. Neurology was consulted.  Patient started on high-dose thiamine.  Transferred to [facility] for continuous EEG.   Possible UTI Urine culture negative.  Treated with ceftriaxone for 3 days Continue home Macrobid for history of recurrent UTI   Chronic A-fib Continue home Tikosyn and Eliquis. Will check electrolytes on a daily  basis.  Monitor on telemetry performed.   Uncontrolled IDDM-2 with hyperglycemia A1c 7.0% on 11/19. Continue current insulin regimen and statin   History of chronic diastolic CHF Appears euvolemic. -Continue holding home Lasix -Monitor fluid status   History of CAD/mild troponin elevation Troponin x2-22 and 23.  Abnormal but not consistent with ACS pattern.  No major events on telemetry.   Osteoporosis. Recently received Reclast prior to admission.   Essential hypertension Continue home losartan. Blood pressure is reasonably well-controlled.   Mood disorder. Resume home Cymbalta.   Overactive bladder. Continue home Myrbetriq   Dysphagia Likely from fatigue and encephalopathy. -On full liquid diet per SLP -Aspiration precaution Speech therapy to continue to follow.   Mnire's disease. Monitor.   History of COPD: Stable.   Renal cyst: 12 mm right kidney cyst noted on CT.  Unchanged from 2021. -Renal MRI protocol outpatient recommended.   DVT Prophylaxis: On Eliquis Code Status: Full code Family Communication: No family at bedside.  Discussed with daughter Disposition Plan: SNF recommended by PT  Status is: Inpatient Remains inpatient appropriate because: Altered mental status      Medications: Scheduled:  apixaban  5 mg Oral BID   dofetilide  250 mcg Oral BID   DULoxetine  30 mg Oral Daily   feeding supplement  237 mL Oral TID BM   insulin aspart  0-5 Units Subcutaneous QHS   insulin aspart  0-6 Units Subcutaneous TID WC   losartan  50 mg Oral Daily   mirabegron ER  50 mg Oral QHS   multivitamin with minerals  1 tablet Oral Daily   mouth rinse  15 mL Mouth Rinse 4 times per day   potassium chloride SA  20 mEq Oral Daily   [START ON 01/19/2022] thiamine (VITAMIN B1) injection  100  mg Intravenous Daily   Continuous:  thiamine (VITAMIN B1) injection 500 mg (01/11/22 2351)   Followed by   Derrill Memo ON 01/13/2022] thiamine (VITAMIN B1) injection      LXB:[id number] **OR** acetaminophen, food thickener, hydrALAZINE, mouth rinse  Antibiotics: Anti-infectives (From admission, onward)    Start     Dose/Rate Route Frequency Ordered Stop   01/06/22 2200  cefTRIAXone (ROCEPHIN) 1 g in sodium chloride 0.9 % 100 mL IVPB  Status:  Discontinued        1 g 200 mL/hr over 30 Minutes Intravenous Every 24 hours 01/06/22 0851 01/08/22 0902   01/06/22 1700  fluconazole (DIFLUCAN) tablet 150 mg        150 mg Oral  Once 01/06/22 1407 01/06/22 1724   01/05/22 2230  cefTRIAXone (ROCEPHIN) 1 g in sodium chloride 0.9 % 100 mL IVPB        1 g 200 mL/hr over 30 Minutes Intravenous  Once 01/05/22 2216 01/06/22 0122       Objective:  Vital Signs  Vitals:   01/11/22 0605 01/11/22 1247 01/12/22 0524 01/12/22 0743  BP: 122/83 131/72 133/88 121/73  Pulse: 67 75 64 61  Resp: '15 20 16 18  '$ Temp: (!) 97.4 F (36.3 C) 98.2 F (36.8 C) 97.8 F (36.6 C) 97.6 F (36.4 C)  TempSrc: Oral Oral Oral Oral  SpO2: 95% 95% 97% 95%  Weight:      Height:        Intake/Output Summary (Last 24 hours) at 01/12/2022 1025 Last data filed at 01/12/2022 0400 Gross per 24 hour  Intake 584.06 ml  Output 1200 ml  Net -615.94 ml   Filed Weights   01/09/22 0441 01/10/22 0500 01/11/22 0500  Weight: 59.2 kg 56.6 kg 56.9 kg    General appearance: Eyes are open but patient does not respond to questions.  Does not follow commands.  Leftward gaze noted. Resp: Clear to auscultation bilaterally.  Normal effort Cardio: S1-S2 is normal regular.  No S3-S4.  No rubs murmurs or bruit GI: Abdomen is soft.  Nontender nondistended.  Bowel sounds are present normal.  No masses organomegaly Extremities: No edema.     Lab Results:  Data Reviewed: I have personally reviewed following labs and reports of the imaging studies  CBC: Recent Labs  Lab 01/05/22 1856 01/06/22 0550 01/11/22 0543  WBC 6.1 6.9 6.5  NEUTROABS 4.0 4.8  --   HGB 15.5* 14.8 15.8*  HCT 46.2* 43.7  47.7*  MCV 92.6 92.8 92.8  PLT 237 224 845    Basic Metabolic Panel: Recent Labs  Lab 01/05/22 1856 01/06/22 0550 01/07/22 0847 01/07/22 1535 01/08/22 0620 01/09/22 0534 01/10/22 0524 01/11/22 0543  NA 142 140 141 143 143 139 140 140  K 3.5 3.5 3.4* 4.0 4.4 3.6 4.2 4.1  CL 109 109 107 109 107 107 111 109  CO2 24 21* 18* '22 23 23 '$ 21* 22  GLUCOSE 145* 119* 125* 144* 99 114* 143* 153*  BUN '18 15 19 20 20 19 '$ 24* 27*  CREATININE 0.62 0.44 0.50 0.70 0.48 0.53 0.46 0.68  CALCIUM 9.1 8.1* 8.7* 8.6* 8.6* 8.6* 8.5* 8.7*  MG 2.1 2.1 2.2  --   --   --   --  2.4  PHOS  --   --   --   --   --   --   --  3.4    GFR: Estimated Creatinine Clearance: 56.3 mL/min (by C-G formula based on SCr of 0.68  mg/dL).  Liver Function Tests: Recent Labs  Lab 01/05/22 1856 01/06/22 0550 01/11/22 0543  AST 18 17  --   ALT 17 15  --   ALKPHOS 70 68  --   BILITOT 1.0 1.1  --   PROT 6.6 6.3*  --   ALBUMIN 3.8 3.4* 3.4*    Recent Labs  Lab 01/05/22 1943  AMMONIA 17     CBG: Recent Labs  Lab 01/11/22 0724 01/11/22 1120 01/11/22 1615 01/11/22 2231 01/12/22 0635  GLUCAP 168* 212* 199* 273* 170*     Recent Results (from the past 240 hour(s))  Urine Culture     Status: None   Collection Time: 01/02/22  4:30 PM   Specimen: Urine, Catheterized  Result Value Ref Range Status   Specimen Description URINE, CATHETERIZED  Final   Special Requests NONE  Final   Culture   Final    NO GROWTH Performed at [facility], [place] [address], [place], [place] [address]    Report Status 01/03/2022 FINAL  Final  Resp Panel by RT-PCR (Flu A&B, Covid) Anterior Nasal Swab     Status: None   Collection Time: 01/05/22  6:57 PM   Specimen: Anterior Nasal Swab  Result Value Ref Range Status   SARS Coronavirus 2 by RT PCR NEGATIVE NEGATIVE Final    Comment: (NOTE) SARS-CoV-2 target nucleic acids are NOT DETECTED.  The SARS-CoV-2 RNA is generally detectable in upper respiratory specimens during  the acute phase of infection. The lowest concentration of SARS-CoV-2 viral copies this assay can detect is 138 copies/mL. A negative result does not preclude SARS-Cov-2 infection and should not be used as the sole basis for treatment or other patient management decisions. A negative result may occur with  improper specimen collection/handling, submission of specimen other than nasopharyngeal swab, presence of viral mutation(s) within the areas targeted by this assay, and inadequate number of viral copies(<138 copies/mL). A negative result must be combined with clinical observations, patient history, and epidemiological information. The expected result is Negative.  Fact Sheet for Patients:  EntrepreneurPulse.com.au  Fact Sheet for Healthcare Providers:  IncredibleEmployment.be  This test is no t yet approved or cleared by the [place] FDA and  has been authorized for detection and/or diagnosis of SARS-CoV-2 by FDA under an Emergency Use Authorization (EUA). This EUA will remain  in effect (meaning this test can be used) for the duration of the COVID-19 declaration under Section 564(b)(1) of the Act, 21 U.S.C.section 360bbb-3(b)(1), unless the authorization is terminated  or revoked sooner.       Influenza A by PCR NEGATIVE NEGATIVE Final   Influenza B by PCR NEGATIVE NEGATIVE Final    Comment: (NOTE) The Xpert Xpress SARS-CoV-2/FLU/RSV plus assay is intended as an aid in the diagnosis of influenza from Nasopharyngeal swab specimens and should not be used as a sole basis for treatment. Nasal washings and aspirates are unacceptable for Xpert Xpress SARS-CoV-2/FLU/RSV testing.  Fact Sheet for Patients: EntrepreneurPulse.com.au  Fact Sheet for Healthcare Providers: IncredibleEmployment.be  This test is not yet approved or cleared by the [place] FDA and has been authorized for detection and/or  diagnosis of SARS-CoV-2 by FDA under an Emergency Use Authorization (EUA). This EUA will remain in effect (meaning this test can be used) for the duration of the COVID-19 declaration under Section 564(b)(1) of the Act, 21 U.S.C. section 360bbb-3(b)(1), unless the authorization is terminated or revoked.  Performed at [facility], [place] [address] [place],  [place] [address]   Urine Culture     Status: None   Collection Time: 01/05/22 10:19 PM   Specimen: Urine, Clean Catch  Result Value Ref Range Status   Specimen Description   Final    URINE, CLEAN CATCH Performed at [facility], [place] [address], [place], [place] [address]    Special Requests   Final    NONE Performed at [facility], [place] [address], [place], [place] [address]    Culture   Final    NO GROWTH Performed at [facility], [place] [address], [place],  [address]    Report Status 01/07/2022 FINAL  Final      Radiology Studies: No results found.     LOS: 6 days   Datron Brakebill [employer] on [email or web address]  01/12/2022, 10:25 AM

## 2022-01-13 DIAGNOSIS — G9340 Encephalopathy, unspecified: Secondary | ICD-10-CM | POA: Diagnosis not present

## 2022-01-13 DIAGNOSIS — I10 Essential (primary) hypertension: Secondary | ICD-10-CM | POA: Diagnosis not present

## 2022-01-13 DIAGNOSIS — I480 Paroxysmal atrial fibrillation: Secondary | ICD-10-CM | POA: Diagnosis not present

## 2022-01-13 DIAGNOSIS — R4182 Altered mental status, unspecified: Secondary | ICD-10-CM | POA: Diagnosis not present

## 2022-01-13 LAB — BASIC METABOLIC PANEL
Anion gap: 12 (ref 5–15)
BUN: 29 mg/dL — ABNORMAL HIGH (ref 8–23)
CO2: 25 mmol/L (ref 22–32)
Calcium: 9.6 mg/dL (ref 8.9–10.3)
Chloride: 107 mmol/L (ref 98–111)
Creatinine, Ser: 0.54 mg/dL (ref 0.44–1.00)
GFR, Estimated: >60 mL/min (ref >60)
Glucose, Bld: 204 mg/dL — ABNORMAL HIGH (ref 70–99)
Potassium: 4.4 mmol/L (ref 3.5–5.1)
Sodium: 144 mmol/L (ref 135–145)

## 2022-01-13 LAB — GLUCOSE, CAPILLARY
Glucose-Capillary: 203 mg/dL — ABNORMAL HIGH (ref 70–99)
Glucose-Capillary: 229 mg/dL — ABNORMAL HIGH (ref 70–99)
Glucose-Capillary: 206 mg/dL — ABNORMAL HIGH (ref 70–99)
Glucose-Capillary: 245 mg/dL — ABNORMAL HIGH (ref 70–99)

## 2022-01-13 LAB — CBC
HCT: 46.5 % — ABNORMAL HIGH (ref 36.0–46.0)
Hemoglobin: 16.1 g/dL — ABNORMAL HIGH (ref 12.0–15.0)
MCH: 32.1 pg (ref 26.0–34.0)
MCHC: 34.6 g/dL (ref 30.0–36.0)
MCV: 92.6 fL (ref 80.0–100.0)
Platelets: 289 10*3/uL (ref 150–400)
RBC: 5.02 MIL/uL (ref 3.87–5.11)
RDW: 13.2 % (ref 11.5–15.5)
WBC: 6.7 10*3/uL (ref 4.0–10.5)
nRBC: 0 % (ref 0.0–0.2)

## 2022-01-13 LAB — MAGNESIUM: Magnesium: 2.2 mg/dL (ref 1.7–2.4)

## 2022-01-13 MED ORDER — POLYVINYL ALCOHOL 1.4 % OP SOLN
1.0000 [drp] | Freq: Two times a day (BID) | OPHTHALMIC | Status: DC
  Administered 2022-01-13 – 2022-01-17 (×8): 1 [drp] via OPHTHALMIC
  Filled 2022-01-13: qty 15

## 2022-01-13 MED ORDER — MELATONIN 3 MG PO TABS
3.0000 mg | ORAL_TABLET | Freq: Every day | ORAL | Status: DC
  Administered 2022-01-13 – 2022-01-16 (×4): 3 mg via ORAL
  Filled 2022-01-13 (×4): qty 1

## 2022-01-13 MED ORDER — INSULIN GLARGINE-YFGN 100 UNIT/ML ~~LOC~~ SOLN
5.0000 [IU] | Freq: Every day | SUBCUTANEOUS | Status: DC
  Administered 2022-01-13 – 2022-01-14 (×2): 5 [IU] via SUBCUTANEOUS
  Filled 2022-01-13 (×3): qty 0.05

## 2022-01-13 NOTE — Progress Notes (Signed)
LTM EEG discontinued - no skin breakdown at unhook.   

## 2022-01-13 NOTE — Procedures (Addendum)
Patient Name: Melanie Lucas  MRN: [id number]  Epilepsy Attending: Lora Havens  Referring Physician/Provider: Greta Doom, MD  Duration: 01/12/2022 0845 to 01/13/2022 0845   Patient history: 71yo F with ams. EEG to evaluate for seizure   Level of alertness: Awake   AEDs during EEG study: None   Technical aspects: This EEG study was done with scalp electrodes positioned according to the 10-20 International system of electrode placement. Electrical activity was reviewed with band pass filter of 1-'70Hz'$ , sensitivity of 7 uV/mm, display speed of 64m/sec with a '60Hz'$  notched filter applied as appropriate. EEG data were recorded continuously and digitally stored.  Video monitoring was available and reviewed as appropriate.   Description: The posterior dominant rhythm consists of 8 Hz activity of moderate voltage (25-35 uV) seen predominantly in posterior head regions, symmetric and reactive to eye opening and eye closing. EEG showed continuous generalized 3 to 6 Hz theta-delta slowing. Hyperventilation and photic stimulation were not performed.      ABNORMALITY - Continuous slow, generalized   IMPRESSION: This study is suggestive of mild to moderate diffuse encephalopathy, nonspecific etiology. No seizures or epileptiform discharges were seen throughout the recording.   [name] [name]

## 2022-01-13 NOTE — Progress Notes (Addendum)
Neurology Progress Note  Interval History: No family at bedside.  Drowsy but easily arousable. RN reports no changes overnight. No further seizure like episodes.  Confusion at baseline.  Intermittently can answer simple commands.  Only followed thumbs up for me.  Cannot understand speech when she tried to answer my questions of name and year.  Exam: Vitals:   01/13/22 0353 01/13/22 0759  BP: (!) 144/86 114/63  Pulse: 69 82  Resp:  14  Temp: 98.4 F (36.9 C) 98.4 F (36.9 C)  SpO2: 97% 90%   Gen: In bed, NAD Resp: non-labored breathing, no acute distress  Neuro:  Mental Status: Drowsy, able to state name, follows simple commands intermittently Cranial Nerves: Pupils equal with left gaze preference but able to cross midline, face appears symmetrical tongue protrusion midline (didn't do to command, stuck tongue out when I asked her to squeeze my hand).  hearing appears intact to voice Motor: Moves all extremities spontaneously, will move upper extremities to command intermittently, localize with upper extremity noxious stimuli, withdraw lower extremities to noxious stimuli Sensory: Appears intact to light touch through and noxious stimuli throughout Gait: Deferred, pt in bed for safety  Pertinent Labs:    Latest Ref Rng & Units 01/13/2022    3:41 AM 01/11/2022    5:43 AM 01/06/2022    5:50 AM  CBC  WBC 4.0 - 10.5 K/uL 6.7  6.5  6.9   Hemoglobin 12.0 - 15.0 g/dL 16.1  15.8  14.8   Hematocrit 36.0 - 46.0 % 46.5  47.7  43.7   Platelets 150 - 400 K/uL 289  300  224        Latest Ref Rng & Units 01/13/2022    3:41 AM 01/11/2022    5:43 AM 01/10/2022    5:24 AM  BMP  Glucose 70 - 99 mg/dL 204  153  143   BUN 8 - 23 mg/dL '29  27  24   '$ Creatinine 0.44 - 1.00 mg/dL 0.54  0.68  0.46   Sodium 135 - 145 mmol/L 144  140  140   Potassium 3.5 - 5.1 mmol/L 4.4  4.1  4.2   Chloride 98 - 111 mmol/L 107  109  111   CO2 22 - 32 mmol/L '25  22  21   '$ Calcium 8.9 - 10.3 mg/dL 9.6  8.7  8.5      TSH 4.1 Free T4  1.19 BUN 27, normal Creat Folate 28 B12 845 Ammonia 17 A1C 7.0 Thiamine pending UA(+), moderate leukocytes and >50 WBC 11/17  Imaging Reviewed:  CT head: No acute abnormality, atrophy and chronic small vessel disease  MRI brain: No acute abnormality, chronic severe Vascular disease [place] 3/3 with ex vacuo ventricular dilatation.  EEG 01/08/22: notes mild-mod encephalopathy, (-) for seizures.  vEEG 01/12/2022: seizure (-)  Swallow FEES pending, cancelled due to vEEG connected.  Will reorder if necessary. ______________________________________________________________________________ Assessment:  71 year old patient with history of multiple strokes and left gaze preference presents after falling on Wednesday (01/03/22) of last week.  Patient was evaluated and released from the ER after this fall, but had more difficulties on Friday, represented to the ED and was treated for a UTI.  Her work-up has been negative, however her mental status has not returned to her baseline.  Per family, at baseline patient walks with walker, feed herself, requires assistance for bathing, has slow speech with some dysarthria/expressive aphasia at baseline.  She has worsening left gaze   Impression:  Delirium,  likely multifactorial, exacerbated by UTI and metabolic encephalopathy and malnutrition/dehydration.  Also, MRI Brain with severe white matter disease as likelihood of an emerging vascular dementia which was .  Pt also has circadian dysrhythmia    Risk factors for worsening vascular damage: afib, poorly controlled DM2, HTN, COPD UTI, Tx with cef q3d Malnutrition Tx inpatient: thiamine (still waiting for lab level), Ensure, and Magic Cup DM2 closely monitored inpatient with sliding scale  Recommendations:  - DC EEG, no epileptic activity found - start melatonin '3mg'$  bedtime for insomnia and continue outpatient - start trazodone '25mg'$  8pm for a few days to help with acute  delirium insomnia  - Education officer, museum recommendation: awaiting discharge inst, probable [place]   ____________________________________________________________________  Patient seen and examined by NP with MD. MD to update note as needed.   Dellia Beckwith, [facility]  To contact Stroke Continuity provider, please refer to [email or web address] After hours, contact General Neurology     I have seen the patient and reviewed the above note.  I suspect that her worsening has been multifactorial.  She did have a fall with a head injury, and I suspect that what followed was progressive delirium, multifactorial in nature with UTI, postconcussion at the beginning, now complicated by hospital related delirium, insomnia.  I would favor trying to restore circadian rhythm, and supportive care.  I do not think there is much in the way of further neurodiagnostic testing to be done at this time.  She will likely gradually improve over the next few weeks.  With no significant response, I think thiamine deficiency is less likely, but would continue repletion given that it takes a long time to get results.  Given no further neurodiagnostic testing, neurology will be available on an as-needed basis moving forward, please call with further questions or concerns.  Roland Rack, MD [facility] [phone]  If 7pm- 7am, please page neurology on call as listed in [place].

## 2022-01-13 NOTE — Progress Notes (Signed)
Physical Therapy Treatment Patient Details Name: Melanie Lucas MRN: [id number] DOB: Jun 30, 1950 Today's Date: 01/13/2022   History of Present Illness Ms. Wahlert is a 71 yr old female admitted to the hospital 01-05-2022 with AMS and recent falls. She was found to have UTI and acute encephalopathy. PMH: OA, TIA, a fib, CVA, chronic diastolic CHF, lymphedema, COPD, CAD, deafness R ear, DM II, peripheral neuropathy    PT Comments    Patient arouses with name called and with some aspects of PROM to extremities. Not following any commands. Total assist with rolling with eyes open, but no active participation. Has left gaze preference at baseline, however did turn head and eyes crossed midline when her name is called from her right side. Will continue 2 week trial of PT to see if she can participate and make progress. Noted per MD note that family reports she was walking with a RW prior to this recent decline.     Recommendations for follow up therapy are one component of a multi-disciplinary discharge planning process, led by the attending physician.  Recommendations may be updated based on patient status, additional functional criteria and insurance authorization.  Follow Up Recommendations  Skilled nursing-short term rehab (<3 hours/day) (unsure of what assist level family can provide and if pt will be able to participate; trial of PT) Can patient physically be transported by private vehicle: No   Assistance Recommended at Discharge Frequent or constant Supervision/Assistance  Patient can return home with the following Two people to help with walking and/or transfers;A lot of help with bathing/dressing/bathroom;Assistance with cooking/housework;Assistance with feeding;Assist for transportation;Help with stairs or ramp for entrance;Direct supervision/assist for medications management;Direct supervision/assist for financial management   Equipment Recommendations  None recommended by PT (unsure of what  pt has at home or will need)    Recommendations for Other Services       Precautions / Restrictions Precautions Precautions: Fall Restrictions Weight Bearing Restrictions: No Other Position/Activity Restrictions: deafness R ear     Mobility  Bed Mobility Overal bed mobility: Needs Assistance Bed Mobility: Rolling Rolling: Total assist         General bed mobility comments: pt with no participation with rolling, does open eyes; sitting EOB deferred due to lack of participation despite eyes open ~50% of session    Transfers                        Ambulation/Gait                   Stairs             Wheelchair Mobility    Modified Rankin (Stroke Patients Only)       Balance                                            Cognition Arousal/Alertness: Lethargic Behavior During Therapy: Flat affect Overall Cognitive Status: No family/caregiver present to determine baseline cognitive functioning                                 General Comments: Not following any commands x 4 extremities; gaze preference to left but will turn head/eyes to right when name called from her right        Exercises Other Exercises Other Exercises:  PROM x 4 extremities trying to elicit AAROM with no response    General Comments        Pertinent Vitals/Pain Pain Assessment Pain Assessment: Faces Faces Pain Scale: No hurt    Home Living                          Prior Function            PT Goals (current goals can now be found in the care plan section) Acute Rehab PT Goals Time For Goal Achievement: 01/24/22 Potential to Achieve Goals: Poor Progress towards PT goals: Not progressing toward goals - comment    Frequency    Min 2X/week      PT Plan Current plan remains appropriate (trial of therapy)    Co-evaluation              AM-PAC PT "6 Clicks" Mobility   Outcome Measure  Help needed  turning from your back to your side while in a flat bed without using bedrails?: Total Help needed moving from lying on your back to sitting on the side of a flat bed without using bedrails?: Total Help needed moving to and from a bed to a chair (including a wheelchair)?: Total Help needed standing up from a chair using your arms (e.g., wheelchair or bedside chair)?: Total Help needed to walk in hospital room?: Total Help needed climbing 3-5 steps with a railing? : Total 6 Click Score: 6    End of Session   Activity Tolerance: Treatment limited secondary to medical complications (Comment) (not able to follow commands) Patient left: in bed;with bed alarm set;with call bell/phone within reach Nurse Communication: Mobility status;Other (comment) (going to ask MD for order PRAFOs due to decreased ankle DF bil) PT Visit Diagnosis: Muscle weakness (generalized) (M62.81);History of falling (Z91.81)     Time: 3382-5053 PT Time Calculation (min) (ACUTE ONLY): 14 min  Charges:  $Therapeutic Exercise: 8-22 mins                      Arby Barrette, PT Acute Rehabilitation Services  Office [phone]    Rexanne Mano 01/13/2022, 2:53 PM

## 2022-01-13 NOTE — Progress Notes (Addendum)
[facility] PROGRESS NOTE   Melanie Lucas NTZ:[id number] DOB: 12/19/1950 DOA: 01/05/2022  PCP: Sue Lush, PA-C  Brief History/Interval Summary: 71 year old F with PMH of COPD, CAD, Mnire's disease with right ear deafness, type II DM, neuropathy, left carotid stenosis, HLD, osteoporosis, paroxysmal A-fib on Tikosyn/Eliquis, anxiety, depression and former smoker presented to the ER with complaints of confusion, decreased oral intake, decreased interaction and excessive sleepiness, and admitted for acute encephalopathy and possible concussion syndrome. Encephalopathy work-up including CT head, MRI brain, B12, TSH, free T4, ammonia and VBG unrevealing. Completed 3 days of IV antibiotics for possible cystitis but no improvement in mental status. EEG negative for seizure but encephalopathy.  Neurology consulted on 11/22, and started high-dose thiamine and recommended transfer to [facility] for LTM EEG.   Consultants: Neurology  Procedures: Continuous EEG    Subjective/Interval History: Patient seems to be a little bit more responsive today compared to yesterday. Following certain commands but does not answer questions.  Still with nonsensical speech.    Assessment/Plan:  Acute encephalopathy/possible concussion Per family, at baseline patient walks with walker, feed herself, requires assistance for bathing, has slow speech with some dysarthria/expressive aphasia at baseline.  She has worsening left gaze.   Encephalopathy work-up including CT head, MRI brain, B12, TSH, ammonia and blood gas negative.   EEG showed encephalopathy but no seizure or epileptiform discharge.  Reportedly had a fall on 11/14 raising concern for possible concussion syndrome. Neurology was consulted.  Patient started on high-dose thiamine.  Transferred to [facility] for continuous EEG. Currently she is not on any antiepileptics.   Possible UTI Urine culture negative.  Treated with ceftriaxone  for 3 days Continue home Macrobid for history of recurrent UTI   Chronic A-fib Continue home Tikosyn and Eliquis. Monitor electrolytes periodically.  Potassium and magnesium level noted to be normal this morning.   Uncontrolled IDDM-2 with hyperglycemia A1c 7.0% on 11/19. CBGs running high.  Will start basal insulin.  Continue SSI.   History of chronic diastolic CHF Appears euvolemic. -Continue holding home Lasix -Monitor fluid status   History of CAD/mild troponin elevation Troponin x2-22 and 23.  Abnormal but not consistent with ACS pattern.  No major events on telemetry.   Osteoporosis. Recently received Reclast prior to admission.   Essential hypertension Continue home losartan. Blood pressure is reasonably well-controlled.   Mood disorder. Resume home Cymbalta.   Overactive bladder. Continue home Myrbetriq   Dysphagia Likely from fatigue and encephalopathy. -On full liquid diet per SLP -Aspiration precaution Speech therapy to continue to follow.  Hopefully diet can be advanced as her mentation improves. Continue to monitor nutritional status.   Mnire's disease. Monitor.   History of COPD: Stable.   Renal cyst: 12 mm right kidney cyst noted on CT.  Unchanged from 2021. -Renal MRI protocol outpatient recommended.   DVT Prophylaxis: On Eliquis Code Status: Full code Family Communication: No family at bedside.  Daughter was called yesterday without success.  Voicemail was left.  Will try again today. Disposition Plan: SNF recommended by PT  Status is: Inpatient Remains inpatient appropriate because: Altered mental status      Medications: Scheduled:  apixaban  5 mg Oral BID   dofetilide  250 mcg Oral BID   DULoxetine  30 mg Oral Daily   feeding supplement  237 mL Oral TID BM   insulin aspart  0-5 Units Subcutaneous QHS   insulin aspart  0-6 Units Subcutaneous TID WC   insulin glargine-yfgn  5 Units Subcutaneous Daily   losartan  50 mg Oral Daily    mirabegron ER  50 mg Oral QHS   multivitamin with minerals  1 tablet Oral Daily   mouth rinse  15 mL Mouth Rinse 4 times per day   potassium chloride SA  20 mEq Oral Daily   [START ON 01/19/2022] thiamine (VITAMIN B1) injection  100 mg Intravenous Daily   Continuous:  thiamine (VITAMIN B1) injection     ZOX:[id number] **OR** acetaminophen, food thickener, hydrALAZINE, mouth rinse  Antibiotics: Anti-infectives (From admission, onward)    Start     Dose/Rate Route Frequency Ordered Stop   01/06/22 2200  cefTRIAXone (ROCEPHIN) 1 g in sodium chloride 0.9 % 100 mL IVPB  Status:  Discontinued        1 g 200 mL/hr over 30 Minutes Intravenous Every 24 hours 01/06/22 0851 01/08/22 0902   01/06/22 1700  fluconazole (DIFLUCAN) tablet 150 mg        150 mg Oral  Once 01/06/22 1407 01/06/22 1724   01/05/22 2230  cefTRIAXone (ROCEPHIN) 1 g in sodium chloride 0.9 % 100 mL IVPB        1 g 200 mL/hr over 30 Minutes Intravenous  Once 01/05/22 2216 01/06/22 0122       Objective:  Vital Signs  Vitals:   01/12/22 2348 01/13/22 0353 01/13/22 0747 01/13/22 0759  BP: (!) 157/91 (!) 144/86 (!) 148/81 114/63  Pulse: 71 69 63 82  Resp:    14  Temp: 98 F (36.7 C) 98.4 F (36.9 C) 98.4 F (36.9 C) 98.4 F (36.9 C)  TempSrc: Oral Oral Oral Oral  SpO2: 96% 97% 97% 90%  Weight:  55.4 kg    Height:        Intake/Output Summary (Last 24 hours) at 01/13/2022 0926 Last data filed at 01/13/2022 0354 Gross per 24 hour  Intake 340 ml  Output 150 ml  Net 190 ml    Filed Weights   01/10/22 0500 01/11/22 0500 01/13/22 0353  Weight: 56.6 kg 56.9 kg 55.4 kg    General appearance: Eyes are open.  Does not track me but does seem to follow some commands.  Does not answer questions. Resp: Clear to auscultation bilaterally.  Normal effort Cardio: S1-S2 is normal regular.  No S3-S4.  No rubs murmurs or bruit GI: Abdomen is soft.  Nontender nondistended.  Bowel sounds are present normal.  No masses  organomegaly Extremities: No edema.   Lab Results:  Data Reviewed: I have personally reviewed following labs and reports of the imaging studies  CBC: Recent Labs  Lab 01/11/22 0543 01/13/22 0341  WBC 6.5 6.7  HGB 15.8* 16.1*  HCT 47.7* 46.5*  MCV 92.8 92.6  PLT 300 289     Basic Metabolic Panel: Recent Labs  Lab 01/07/22 0847 01/07/22 1535 01/08/22 0620 01/09/22 0534 01/10/22 0524 01/11/22 0543 01/13/22 0341  NA 141   < > 143 139 140 140 144  K 3.4*   < > 4.4 3.6 4.2 4.1 4.4  CL 107   < > 107 107 111 109 107  CO2 18*   < > 23 23 21* 22 25  GLUCOSE 125*   < > 99 114* 143* 153* 204*  BUN 19   < > 20 19 24* 27* 29*  CREATININE 0.50   < > 0.48 0.53 0.46 0.68 0.54  CALCIUM 8.7*   < > 8.6* 8.6* 8.5* 8.7* 9.6  MG 2.2  --   --   --   --  2.4 2.2  PHOS  --   --   --   --   --  3.4  --    < > = values in this interval not displayed.     GFR: Estimated Creatinine Clearance: 54.8 mL/min (by C-G formula based on SCr of 0.54 mg/dL).  Liver Function Tests: Recent Labs  Lab 01/11/22 0543  ALBUMIN 3.4*     CBG: Recent Labs  Lab 01/12/22 1129 01/12/22 1616 01/12/22 1704 01/12/22 2152 01/13/22 0624  GLUCAP 247* 322* 390* 323* 203*      Recent Results (from the past 240 hour(s))  Resp Panel by RT-PCR (Flu A&B, Covid) Anterior Nasal Swab     Status: None   Collection Time: 01/05/22  6:57 PM   Specimen: Anterior Nasal Swab  Result Value Ref Range Status   SARS Coronavirus 2 by RT PCR NEGATIVE NEGATIVE Final    Comment: (NOTE) SARS-CoV-2 target nucleic acids are NOT DETECTED.  The SARS-CoV-2 RNA is generally detectable in upper respiratory specimens during the acute phase of infection. The lowest concentration of SARS-CoV-2 viral copies this assay can detect is 138 copies/mL. A negative result does not preclude SARS-Cov-2 infection and should not be used as the sole basis for treatment or other patient management decisions. A negative result may occur with   improper specimen collection/handling, submission of specimen other than nasopharyngeal swab, presence of viral mutation(s) within the areas targeted by this assay, and inadequate number of viral copies(<138 copies/mL). A negative result must be combined with clinical observations, patient history, and epidemiological information. The expected result is Negative.  Fact Sheet for Patients:  EntrepreneurPulse.com.au  Fact Sheet for Healthcare Providers:  IncredibleEmployment.be  This test is no t yet approved or cleared by the [place] FDA and  has been authorized for detection and/or diagnosis of SARS-CoV-2 by FDA under an Emergency Use Authorization (EUA). This EUA will remain  in effect (meaning this test can be used) for the duration of the COVID-19 declaration under Section 564(b)(1) of the Act, 21 U.S.C.section 360bbb-3(b)(1), unless the authorization is terminated  or revoked sooner.       Influenza A by PCR NEGATIVE NEGATIVE Final   Influenza B by PCR NEGATIVE NEGATIVE Final    Comment: (NOTE) The Xpert Xpress SARS-CoV-2/FLU/RSV plus assay is intended as an aid in the diagnosis of influenza from Nasopharyngeal swab specimens and should not be used as a sole basis for treatment. Nasal washings and aspirates are unacceptable for Xpert Xpress SARS-CoV-2/FLU/RSV testing.  Fact Sheet for Patients: EntrepreneurPulse.com.au  Fact Sheet for Healthcare Providers: IncredibleEmployment.be  This test is not yet approved or cleared by the [place] FDA and has been authorized for detection and/or diagnosis of SARS-CoV-2 by FDA under an Emergency Use Authorization (EUA). This EUA will remain in effect (meaning this test can be used) for the duration of the COVID-19 declaration under Section 564(b)(1) of the Act, 21 U.S.C. section 360bbb-3(b)(1), unless the authorization is terminated  or revoked.  Performed at [facility], [place] [address], [place], [place] [address]   Urine Culture     Status: None   Collection Time: 01/05/22 10:19 PM   Specimen: Urine, Clean Catch  Result Value Ref Range Status   Specimen Description   Final    URINE, CLEAN CATCH Performed at [facility], [place] [address] [place], [place] [address]    Special Requests   Final    NONE Performed at [facility], [address] [place], [place] [address]    Culture   Final    NO GROWTH Performed at [facility], [place] [address] [place], [place] [address]    Report Status 01/07/2022 FINAL  Final      Radiology Studies: No results found.     LOS: 7 days   Jameson Morrow [employer] on [email or web address]  01/13/2022, 9:26 AM

## 2022-01-13 NOTE — Progress Notes (Signed)
PT Cancellation Note  Patient Details Name: Melanie Lucas MRN: [id number] DOB: [birth year]   Cancelled Treatment:    Reason Eval/Treat Not Completed: Patient at procedure or test/unavailable  Patient under going EEG.    [place]  Office [phone]  Rexanne Mano 01/13/2022, 12:55 PM

## 2022-01-14 DIAGNOSIS — I10 Essential (primary) hypertension: Secondary | ICD-10-CM | POA: Diagnosis not present

## 2022-01-14 DIAGNOSIS — I480 Paroxysmal atrial fibrillation: Secondary | ICD-10-CM | POA: Diagnosis not present

## 2022-01-14 DIAGNOSIS — G9340 Encephalopathy, unspecified: Secondary | ICD-10-CM | POA: Diagnosis not present

## 2022-01-14 LAB — BASIC METABOLIC PANEL
Anion gap: 9 (ref 5–15)
BUN: 27 mg/dL — ABNORMAL HIGH (ref 8–23)
CO2: 27 mmol/L (ref 22–32)
Calcium: 9 mg/dL (ref 8.9–10.3)
Chloride: 105 mmol/L (ref 98–111)
Creatinine, Ser: 0.6 mg/dL (ref 0.44–1.00)
GFR, Estimated: >60 mL/min (ref >60)
Glucose, Bld: 181 mg/dL — ABNORMAL HIGH (ref 70–99)
Potassium: 3.9 mmol/L (ref 3.5–5.1)
Sodium: 141 mmol/L (ref 135–145)

## 2022-01-14 LAB — GLUCOSE, CAPILLARY
Glucose-Capillary: 169 mg/dL — ABNORMAL HIGH (ref 70–99)
Glucose-Capillary: 263 mg/dL — ABNORMAL HIGH (ref 70–99)
Glucose-Capillary: 398 mg/dL — ABNORMAL HIGH (ref 70–99)
Glucose-Capillary: 246 mg/dL — ABNORMAL HIGH (ref 70–99)

## 2022-01-14 LAB — VITAMIN B1: Vitamin B1 (Thiamine): 136.1 nmol/L (ref 66.5–200.0)

## 2022-01-14 LAB — MAGNESIUM: Magnesium: 2.2 mg/dL (ref 1.7–2.4)

## 2022-01-14 MED ORDER — POTASSIUM CHLORIDE CRYS ER 20 MEQ PO TBCR
40.0000 meq | EXTENDED_RELEASE_TABLET | Freq: Once | ORAL | Status: AC
  Administered 2022-01-14: 40 meq via ORAL
  Filled 2022-01-14: qty 2

## 2022-01-14 NOTE — Progress Notes (Signed)
Spoke to [place] with [facility] admissions who reports they are reviewing referral and will have determination Monday. SW will f/u.   Wandra Feinstein, MSW, LCSW [phone] (coverage)

## 2022-01-14 NOTE — Procedures (Signed)
Patient Name: Melanie Lucas  MRN: [id number]  Epilepsy Attending: Lora Havens  Referring Physician/Provider: Greta Doom, MD  Duration: 01/13/2022 0845 to 01/13/2022 1347   Patient history: 71yo F with ams. EEG to evaluate for seizure   Level of alertness: Awake   AEDs during EEG study: None   Technical aspects: This EEG study was done with scalp electrodes positioned according to the 10-20 International system of electrode placement. Electrical activity was reviewed with band pass filter of 1-'70Hz'$ , sensitivity of 7 uV/mm, display speed of 71m/sec with a '60Hz'$  notched filter applied as appropriate. EEG data were recorded continuously and digitally stored.  Video monitoring was available and reviewed as appropriate.   Description: The posterior dominant rhythm consists of 8 Hz activity of moderate voltage (25-35 uV) seen predominantly in posterior head regions, symmetric and reactive to eye opening and eye closing. EEG showed continuous generalized 3 to 6 Hz theta-delta slowing. Hyperventilation and photic stimulation were not performed.      ABNORMALITY - Continuous slow, generalized   IMPRESSION: This study is suggestive of mild to moderate diffuse encephalopathy, nonspecific etiology. No seizures or epileptiform discharges were seen throughout the recording.   Melanie Lucas Melanie Lucas

## 2022-01-14 NOTE — Progress Notes (Signed)
[facility] PROGRESS NOTE   Melanie Lucas TML:[id number] DOB: 04/09/1950 DOA: 01/05/2022  PCP: Sue Lush, PA-C  Brief History/Interval Summary: 71 year old F with PMH of COPD, CAD, Mnire's disease with right ear deafness, type II DM, neuropathy, left carotid stenosis, HLD, osteoporosis, paroxysmal A-fib on Tikosyn/Eliquis, anxiety, depression and former smoker presented to the ER with complaints of confusion, decreased oral intake, decreased interaction and excessive sleepiness, and admitted for acute encephalopathy and possible concussion syndrome. Encephalopathy work-up including CT head, MRI brain, B12, TSH, free T4, ammonia and VBG unrevealing. Completed 3 days of IV antibiotics for possible cystitis but no improvement in mental status. EEG negative for seizure but encephalopathy.  Neurology consulted on 11/22, and started high-dose thiamine and recommended transfer to [facility] for LTM EEG.   Consultants: Neurology  Procedures: Continuous EEG    Subjective/Interval History: Patient is not very communicative.  Follows certain commands but not others.  No overnight events reported by nursing staff.   Assessment/Plan:  Acute encephalopathy/possible concussion Per family, at baseline patient walks with walker, feed herself, requires assistance for bathing, has slow speech with some dysarthria/expressive aphasia at baseline.  She had worsening left gaze.   Encephalopathy work-up including CT head, MRI brain, B12, TSH, ammonia and blood gas negative.   EEG showed encephalopathy but no seizure or epileptiform discharge.  Reportedly had a fall on 11/14 raising concern for possible concussion syndrome. Neurology was consulted.  Patient started on high-dose thiamine.  Transferred to [facility] for continuous EEG. No epileptiform activity noted.  Long-term monitoring has been discontinued. Neurology feels that her encephalopathy is multifactorial in etiology.  Fall  has contributed with progressive delirium along with UTI and postconcussion syndrome. Patient has been started on melatonin. Neurology anticipate that recovery will take several weeks. Seen by physical therapy and skilled nursing facility is recommended.  Oropharyngeal dysphagia Speech therapy is following.  Patient currently on full liquid diet.  To undergo modified barium swallow at some point in time.  Hopefully tomorrow.  Hopefully her diet can be advanced.   Possible UTI Urine culture negative.  Treated with ceftriaxone for 3 days Continue home Macrobid for history of recurrent UTI   Chronic A-fib Continue home Tikosyn and Eliquis. Potassium noted to be 3.9.  Will supplement.  Magnesium is 2.2.   Uncontrolled IDDM-2 with hyperglycemia A1c 7.0% on 11/19.  CBGs were noted to be elevated.  Patient was started on basal insulin.  Continue to monitor.  Continue SSI.   History of chronic diastolic CHF Appears euvolemic. Continue holding home Lasix. Monitor fluid status   History of CAD/mild troponin elevation Troponin x2-22 and 23.  Abnormal but not consistent with ACS pattern.  No major events on telemetry.   Osteoporosis. Recently received Reclast prior to admission.   Essential hypertension Blood pressure reasonably well-controlled.  Continue losartan.   Mood disorder. Resume home Cymbalta.   Overactive bladder. Continue home Myrbetriq   Mnire's disease. Monitor.   History of COPD: Stable.   Renal cyst: 12 mm right kidney cyst noted on CT.  Unchanged from 2021. -Renal MRI protocol outpatient recommended.   DVT Prophylaxis: On Eliquis Code Status: Full code Family Communication: Daughter was updated yesterday.  Neurology was also supposed to update daughter yesterday. Disposition Plan: SNF recommended by PT  Status is: Inpatient Remains inpatient appropriate because: Altered mental status      Medications: Scheduled:  apixaban  5 mg Oral BID   dofetilide   250 mcg Oral BID  DULoxetine  30 mg Oral Daily   feeding supplement  237 mL Oral TID BM   insulin aspart  0-5 Units Subcutaneous QHS   insulin aspart  0-6 Units Subcutaneous TID WC   insulin glargine-yfgn  5 Units Subcutaneous Daily   losartan  50 mg Oral Daily   melatonin  3 mg Oral QHS   mirabegron ER  50 mg Oral QHS   multivitamin with minerals  1 tablet Oral Daily   mouth rinse  15 mL Mouth Rinse 4 times per day   polyvinyl alcohol  1 drop Both Eyes BID   potassium chloride SA  20 mEq Oral Daily   [START ON 01/19/2022] thiamine (VITAMIN B1) injection  100 mg Intravenous Daily   Continuous:  thiamine (VITAMIN B1) injection 250 mg (01/14/22 0842)   DDU:[id number] **OR** acetaminophen, food thickener, hydrALAZINE, mouth rinse  Antibiotics: Anti-infectives (From admission, onward)    Start     Dose/Rate Route Frequency Ordered Stop   01/06/22 2200  cefTRIAXone (ROCEPHIN) 1 g in sodium chloride 0.9 % 100 mL IVPB  Status:  Discontinued        1 g 200 mL/hr over 30 Minutes Intravenous Every 24 hours 01/06/22 0851 01/08/22 0902   01/06/22 1700  fluconazole (DIFLUCAN) tablet 150 mg        150 mg Oral  Once 01/06/22 1407 01/06/22 1724   01/05/22 2230  cefTRIAXone (ROCEPHIN) 1 g in sodium chloride 0.9 % 100 mL IVPB        1 g 200 mL/hr over 30 Minutes Intravenous  Once 01/05/22 2216 01/06/22 0122       Objective:  Vital Signs  Vitals:   01/13/22 2209 01/13/22 2327 01/14/22 0421 01/14/22 0755  BP: 117/70 123/79 118/67 124/72  Pulse:  (!) 57 60 60  Resp:  '18 18 18  '$ Temp:  97.6 F (36.4 C) (!) 97.5 F (36.4 C) 97.8 F (36.6 C)  TempSrc:  Axillary Axillary Oral  SpO2: 97% 95% 100% 98%  Weight:      Height:        Intake/Output Summary (Last 24 hours) at 01/14/2022 1041 Last data filed at 01/14/2022 0322 Gross per 24 hour  Intake 290 ml  Output 550 ml  Net -260 ml    Filed Weights   01/10/22 0500 01/11/22 0500 01/13/22 0353  Weight: 56.6 kg 56.9 kg 55.4 kg     General appearance: Noted to be awake.  Not communicative.  Does not follow commands. Resp: Clear to auscultation bilaterally.  Normal effort Cardio: S1-S2 is normal regular.  No S3-S4.  No rubs murmurs or bruit GI: Abdomen is soft.  Nontender nondistended.  Bowel sounds are present normal.  No masses organomegaly Extremities: No edema.    Lab Results:  Data Reviewed: I have personally reviewed following labs and reports of the imaging studies  CBC: Recent Labs  Lab 01/11/22 0543 01/13/22 0341  WBC 6.5 6.7  HGB 15.8* 16.1*  HCT 47.7* 46.5*  MCV 92.8 92.6  PLT 300 289     Basic Metabolic Panel: Recent Labs  Lab 01/09/22 0534 01/10/22 0524 01/11/22 0543 01/13/22 0341 01/14/22 0347  NA 139 140 140 144 141  K 3.6 4.2 4.1 4.4 3.9  CL 107 111 109 107 105  CO2 23 21* '22 25 27  '$ GLUCOSE 114* 143* 153* 204* 181*  BUN 19 24* 27* 29* 27*  CREATININE 0.53 0.46 0.68 0.54 0.60  CALCIUM 8.6* 8.5* 8.7* 9.6 9.0  MG  --   --  2.4 2.2 2.2  PHOS  --   --  3.4  --   --      GFR: Estimated Creatinine Clearance: 54.8 mL/min (by C-G formula based on SCr of 0.6 mg/dL).  Liver Function Tests: Recent Labs  Lab 01/11/22 0543  ALBUMIN 3.4*     CBG: Recent Labs  Lab 01/13/22 0624 01/13/22 1243 01/13/22 1632 01/13/22 2116 01/14/22 0622  GLUCAP 203* 229* 206* 245* 169*      Recent Results (from the past 240 hour(s))  Resp Panel by RT-PCR (Flu A&B, Covid) Anterior Nasal Swab     Status: None   Collection Time: 01/05/22  6:57 PM   Specimen: Anterior Nasal Swab  Result Value Ref Range Status   SARS Coronavirus 2 by RT PCR NEGATIVE NEGATIVE Final    Comment: (NOTE) SARS-CoV-2 target nucleic acids are NOT DETECTED.  The SARS-CoV-2 RNA is generally detectable in upper respiratory specimens during the acute phase of infection. The lowest concentration of SARS-CoV-2 viral copies this assay can detect is 138 copies/mL. A negative result does not preclude  SARS-Cov-2 infection and should not be used as the sole basis for treatment or other patient management decisions. A negative result may occur with  improper specimen collection/handling, submission of specimen other than nasopharyngeal swab, presence of viral mutation(s) within the areas targeted by this assay, and inadequate number of viral copies(<138 copies/mL). A negative result must be combined with clinical observations, patient history, and epidemiological information. The expected result is Negative.  Fact Sheet for Patients:  EntrepreneurPulse.com.au  Fact Sheet for Healthcare Providers:  IncredibleEmployment.be  This test is no t yet approved or cleared by the [place] FDA and  has been authorized for detection and/or diagnosis of SARS-CoV-2 by FDA under an Emergency Use Authorization (EUA). This EUA will remain  in effect (meaning this test can be used) for the duration of the COVID-19 declaration under Section 564(b)(1) of the Act, 21 U.S.C.section 360bbb-3(b)(1), unless the authorization is terminated  or revoked sooner.       Influenza A by PCR NEGATIVE NEGATIVE Final   Influenza B by PCR NEGATIVE NEGATIVE Final    Comment: (NOTE) The Xpert Xpress SARS-CoV-2/FLU/RSV plus assay is intended as an aid in the diagnosis of influenza from Nasopharyngeal swab specimens and should not be used as a sole basis for treatment. Nasal washings and aspirates are unacceptable for Xpert Xpress SARS-CoV-2/FLU/RSV testing.  Fact Sheet for Patients: EntrepreneurPulse.com.au  Fact Sheet for Healthcare Providers: IncredibleEmployment.be  This test is not yet approved or cleared by the [place] FDA and has been authorized for detection and/or diagnosis of SARS-CoV-2 by FDA under an Emergency Use Authorization (EUA). This EUA will remain in effect (meaning this test can be used) for the duration of  the COVID-19 declaration under Section 564(b)(1) of the Act, 21 U.S.C. section 360bbb-3(b)(1), unless the authorization is terminated or revoked.  Performed at [facility], [place] [address] [place], [place] [address]   Urine Culture     Status: None   Collection Time: 01/05/22 10:19 PM   Specimen: Urine, Clean Catch  Result Value Ref Range Status   Specimen Description   Final    URINE, CLEAN CATCH Performed at [facility], [place] [address] [place], [place] [address]    Special Requests   Final    NONE Performed at [facility], [place] [address], [place], [place] [address]    Culture   Final  NO GROWTH Performed at [facility], [place] [address], [place], [place] [address]    Report Status 01/07/2022 FINAL  Final      Radiology Studies: Overnight EEG with video  Result Date: 01/13/2022 Lora Havens, MD     01/13/2022 10:01 AM Patient Name: Melanie Lucas MRN: [id number] Epilepsy Attending: Lora Havens Referring Physician/Provider: Greta Doom, MD Duration: 01/12/2022 0845 to 01/13/2022 0845  Patient history: 71yo F with ams. EEG to evaluate for seizure  Level of alertness: Awake  AEDs during EEG study: None  Technical aspects: This EEG study was done with scalp electrodes positioned according to the 10-20 International system of electrode placement. Electrical activity was reviewed with band pass filter of 1-'70Hz'$ , sensitivity of 7 uV/mm, display speed of 57m/sec with a '60Hz'$  notched filter applied as appropriate. EEG data were recorded continuously and digitally stored.  Video monitoring was available and reviewed as appropriate.  Description: The posterior dominant rhythm consists of 8 Hz activity of moderate voltage (25-35 uV) seen predominantly in posterior head regions, symmetric and reactive to eye opening and eye closing. EEG showed continuous generalized 3 to 6 Hz theta-delta slowing.  Hyperventilation and photic stimulation were not performed.    ABNORMALITY - Continuous slow, generalized  IMPRESSION: This study is suggestive of mild to moderate diffuse encephalopathy, nonspecific etiology. No seizures or epileptiform discharges were seen throughout the recording.  [place]      LOS: 8 days   Tayte Childers [employer] [email or web address]  01/14/2022, 10:41 AM

## 2022-01-15 DIAGNOSIS — G9340 Encephalopathy, unspecified: Secondary | ICD-10-CM | POA: Diagnosis not present

## 2022-01-15 DIAGNOSIS — I480 Paroxysmal atrial fibrillation: Secondary | ICD-10-CM | POA: Diagnosis not present

## 2022-01-15 DIAGNOSIS — I10 Essential (primary) hypertension: Secondary | ICD-10-CM | POA: Diagnosis not present

## 2022-01-15 LAB — GLUCOSE, CAPILLARY
Glucose-Capillary: 233 mg/dL — ABNORMAL HIGH (ref 70–99)
Glucose-Capillary: 245 mg/dL — ABNORMAL HIGH (ref 70–99)
Glucose-Capillary: 184 mg/dL — ABNORMAL HIGH (ref 70–99)
Glucose-Capillary: 203 mg/dL — ABNORMAL HIGH (ref 70–99)

## 2022-01-15 LAB — BASIC METABOLIC PANEL
Anion gap: 10 (ref 5–15)
BUN: 30 mg/dL — ABNORMAL HIGH (ref 8–23)
CO2: 24 mmol/L (ref 22–32)
Calcium: 8.9 mg/dL (ref 8.9–10.3)
Chloride: 101 mmol/L (ref 98–111)
Creatinine, Ser: 0.58 mg/dL (ref 0.44–1.00)
GFR, Estimated: >60 mL/min (ref >60)
Glucose, Bld: 216 mg/dL — ABNORMAL HIGH (ref 70–99)
Potassium: 4.4 mmol/L (ref 3.5–5.1)
Sodium: 135 mmol/L (ref 135–145)

## 2022-01-15 LAB — MAGNESIUM: Magnesium: 2 mg/dL (ref 1.7–2.4)

## 2022-01-15 MED ORDER — INSULIN GLARGINE-YFGN 100 UNIT/ML ~~LOC~~ SOLN
10.0000 [IU] | Freq: Every day | SUBCUTANEOUS | Status: DC
  Administered 2022-01-15 – 2022-01-17 (×3): 10 [IU] via SUBCUTANEOUS
  Filled 2022-01-15 (×3): qty 0.1

## 2022-01-15 MED ORDER — INSULIN ASPART 100 UNIT/ML IJ SOLN
0.0000 [IU] | Freq: Every day | INTRAMUSCULAR | Status: DC
  Administered 2022-01-15: 2 [IU] via SUBCUTANEOUS
  Administered 2022-01-16: 4 [IU] via SUBCUTANEOUS

## 2022-01-15 MED ORDER — INSULIN ASPART 100 UNIT/ML IJ SOLN
0.0000 [IU] | Freq: Three times a day (TID) | INTRAMUSCULAR | Status: DC
  Administered 2022-01-15 – 2022-01-16 (×2): 3 [IU] via SUBCUTANEOUS
  Administered 2022-01-16: 11 [IU] via SUBCUTANEOUS
  Administered 2022-01-16: 3 [IU] via SUBCUTANEOUS
  Administered 2022-01-17: 5 [IU] via SUBCUTANEOUS

## 2022-01-15 MED ORDER — SODIUM CHLORIDE 0.45 % IV SOLN: INTRAVENOUS | Status: AC

## 2022-01-15 NOTE — Progress Notes (Signed)
Speech Language Pathology Treatment: Dysphagia  Patient Details Name: Melanie Lucas MRN: [id number] DOB: March 02, 1950 Today's Date: 01/15/2022 Time: 4193-7902 SLP Time Calculation (min) (ACUTE ONLY): 7 min  Assessment / Plan / Recommendation Clinical Impression  Pt seen for ongoing dysphagia management.  Pt awake/alert and attending to trials but nonverbal  Pt with improved tolerance of PO trials today. Pt tolerated puree and nectar thick liquid by straw and teaspoon without overt s/s of aspiration.  Pt exhibited good oral clearance of puree.  Pt tolerated thin liquid by spoon.  With straw sip of thin liquid there was anterior loss and multiple swallows.  Recommend instrumental assessment of swallow function prior to changes to PO diet.  Will plan for MBSS next date.   Recommend continuing full liquid, nectar thick diet. Pt may have small amounts of water by spoon in between meals, after good oral care, in moderation, when fully awake/alert, with upright positioning and supervision.   HPI HPI: Patient is Melanie Lucas with PMH: anxiety, depression, CHF, COPD, right ear deafness, DM-2, HTN, HLD, incontinence, left carotid stenosis, h/o CVA's with residual dysarthria, memory issues, balance issues, cognitive impairment (score of 10 out of 30 on SLUMS  07/27/21). She presented to the hospital on 01/05/2022 due to AMS. Patient had fall four days prior to this admission and was discharged from the hospital in stable condition. Daughter reported that since the fall, she has had difficulty talking, following commands, walking and tolerating oral intake. In ED, she was afebrile, RR 18 and saturating at 99% on RA. She was admitted with acute metabolic encephalopathy in the setting of acute cystitis.  Pt underwent BSE  and was placed on dys3/thin diet - downgraded to dys2/thin diet.  Follow up for dysphagia management indicated.      SLP Plan  MBS      Recommendations for follow up therapy are one component  of Melanie multi-disciplinary discharge planning process, led by the attending physician.  Recommendations may be updated based on patient status, additional functional criteria and insurance authorization.    Recommendations  Diet recommendations: Nectar-thick liquid Liquids provided via: Cup;Straw Medication Administration: Crushed with puree Supervision: Full supervision/cueing for compensatory strategies;Staff to assist with self feeding Compensations: Small sips/bites;Slow rate;Minimize environmental distractions Postural Changes and/or Swallow Maneuvers: Seated upright 90 degrees                Oral Care Recommendations: Oral care BID;Oral care before and after PO Follow Up Recommendations: Skilled nursing-short term rehab (<3 hours/day) Assistance recommended at discharge: Frequent or constant Supervision/Assistance SLP Visit Diagnosis: Dysphagia, unspecified (R13.10) Plan: [place], [place], [place] Office: [phone] 01/15/2022, 11:14 AM

## 2022-01-15 NOTE — Progress Notes (Signed)
Occupational Therapy Treatment Patient Details Name: Melanie Lucas MRN: [id number] DOB: [birth year] Today's Date: 01/15/2022   History of present illness Ms. Claw is a 71 yr old female admitted to the hospital 01-05-2022 with AMS and recent falls. She was found to have UTI and acute encephalopathy. PMH: OA, TIA, a fib, CVA, chronic diastolic CHF, lymphedema, COPD, CAD, deafness R ear, DM II, peripheral neuropathy   OT comments  Patient is currently not following any commands in session, and remains total A for all aspects of care. Extra time and tactile cues provided throughout, with patient with significant L gaze preference and garbed and inconsistent responses. OT will continue to work with patient on a trial basis, however may sign off due to a lack of participation.    Recommendations for follow up therapy are one component of a multi-disciplinary discharge planning process, led by the attending physician.  Recommendations may be updated based on patient status, additional functional criteria and insurance authorization.    Follow Up Recommendations  Skilled nursing-short term rehab (<3 hours/day)     Assistance Recommended at Discharge Frequent or constant Supervision/Assistance  Patient can return home with the following  Direct supervision/assist for medications management;Two people to help with bathing/dressing/bathroom;Direct supervision/assist for financial management;Assistance with cooking/housework;Assist for transportation;Assistance with feeding;Two people to help with walking and/or transfers   Equipment Recommendations  Other (comment) (to be determined pending pt functional presentation and progress)    Recommendations for Other Services      Precautions / Restrictions Precautions Precautions: Fall Restrictions Weight Bearing Restrictions: No Other Position/Activity Restrictions: deafness R ear       Mobility Bed Mobility Overal bed mobility: Needs  Assistance Bed Mobility: Rolling Rolling: Total assist              Transfers                         Balance                                           ADL either performed or assessed with clinical judgement   ADL Overall ADL's : Needs assistance/impaired     Grooming: Total assistance;Bed level               Lower Body Dressing: Bed level;Total assistance               Functional mobility during ADLs: Total assistance;+2 for physical assistance;+2 for safety/equipment      Extremity/Trunk Assessment              Vision       Perception     Praxis      Cognition Arousal/Alertness: Lethargic Behavior During Therapy: Flat affect Overall Cognitive Status: No family/caregiver present to determine baseline cognitive functioning                                 General Comments: Not following any commands x 4 extremities; gaze preference to left but will turn head/eyes to right when name called from her right        Exercises      Shoulder Instructions       General Comments      Pertinent Vitals/ Pain       Pain Assessment Pain Assessment:  Faces Faces Pain Scale: No hurt  Home Living                                          Prior Functioning/Environment              Frequency  Min 2X/week        Progress Toward Goals  OT Goals(current goals can now be found in the care plan section)  Progress towards OT goals: Progressing toward goals  Acute Rehab OT Goals OT Goal Formulation: Patient unable to participate in goal setting Time For Goal Achievement: 01/24/22 Potential to Achieve Goals: [place] Discharge plan remains appropriate    Co-evaluation                 AM-PAC OT "6 Clicks" Daily Activity     Outcome Measure   Help from another person eating meals?: Total Help from another person taking care of personal grooming?: Total Help from  another person toileting, which includes using toliet, bedpan, or urinal?: Total Help from another person bathing (including washing, rinsing, drying)?: Total Help from another person to put on and taking off regular upper body clothing?: Total Help from another person to put on and taking off regular lower body clothing?: Total 6 Click Score: 6    End of Session    OT Visit Diagnosis: Muscle weakness (generalized) (M62.81);History of falling (Z91.81)   Activity Tolerance Other (comment) (limited by no command following)   Patient Left in bed;with call bell/phone within reach;with bed alarm set   Nurse Communication Mobility status        Time: 9169-4503 OT Time Calculation (min): 10 min  Charges: OT General Charges $OT Visit: 1 Visit OT Treatments $Self Care/Home Management : 8-22 mins  Corinne Ports E. Kasandra Fehr, OTR/L Acute Rehabilitation Services [phone]   Ascencion Dike 01/15/2022, 1:01 PM

## 2022-01-15 NOTE — Progress Notes (Signed)
Inpatient Diabetes Program Recommendations  AACE/ADA: New Consensus Statement on Inpatient Glycemic Control (2015)  Target Ranges:  Prepandial:   less than 140 mg/dL      Peak postprandial:   less than 180 mg/dL (1-2 hours)      Critically ill patients:  140 - 180 mg/dL   Lab Results  Component Value Date   GLUCAP 245 (H) 01/15/2022   HGBA1C 7.0 (H) 01/07/2022    Review of Glycemic Control  Latest Reference Range & Units 01/14/22 21:47 01/15/22 06:07 01/15/22 12:27  Glucose-Capillary 70 - 99 mg/dL 246 (H) 233 (H) 245 (H)   Diabetes history: DM 2 Outpatient Diabetes medications:  Lantus 8 units daily, Synjardy 12.06/998 mg bid Current orders for Inpatient glycemic control:  Semglee 10 units daily Novolog 0-6 units tid with meals and HS  Inpatient Diabetes Program Recommendations:    May consider changing Novolog correction to sensitive (0-9 units)  tid with meals and HS (instead of very sensitive).   Thanks,  Adah Perl, RN, BC-ADM Inpatient Diabetes Coordinator Pager [phone]  (8a-5p)

## 2022-01-15 NOTE — TOC Progression Note (Signed)
Transition of Care [facility]) - Progression Note    Patient Details  Name: MAVEN ROSANDER MRN: [id number] Date of Birth: Apr 15, 1950  Transition of Care [facility]) CM/SW [place], [place] Phone Number: 01/15/2022, 1:18 PM  Clinical Narrative:    CSW followed up with [facility] and was advised they are still reviewing. Admissions will alert CSW when a decision has been made. TOC will continue to follow for DC needs.        Expected Discharge Plan and Services                                                 Social Determinants of Health (SDOH) Interventions Food Insecurity Interventions: Intervention Not Indicated Housing Interventions: Intervention Not Indicated Transportation Interventions: Intervention Not Indicated Utilities Interventions: Intervention Not Indicated  Readmission Risk Interventions    01/10/2022    3:00 PM 08/01/2021   10:59 AM  Readmission Risk Prevention Plan  Transportation Screening Complete Complete  PCP or Specialist Appt within 5-7 Days  Complete  PCP or Specialist Appt within 3-5 Days Complete   Home Care Screening  Complete  Medication Review (RN CM)  Complete  HRI or Home Care Consult Complete   Social Work Consult for [place] Planning/Counseling Complete   Palliative Care Screening Not Applicable   Medication Review Press photographer) Complete

## 2022-01-15 NOTE — Progress Notes (Signed)
[facility] PROGRESS NOTE   VASTIE DOUTY HGD:[id number] DOB: 07/02/1950 DOA: 01/05/2022  PCP: Sue Lush, PA-C  Brief History/Interval Summary: 71 year old F with PMH of COPD, CAD, Mnire's disease with right ear deafness, type II DM, neuropathy, left carotid stenosis, HLD, osteoporosis, paroxysmal A-fib on Tikosyn/Eliquis, anxiety, depression and former smoker presented to the ER with complaints of confusion, decreased oral intake, decreased interaction and excessive sleepiness, and admitted for acute encephalopathy and possible concussion syndrome. Encephalopathy work-up including CT head, MRI brain, B12, TSH, free T4, ammonia and VBG unrevealing. Completed 3 days of IV antibiotics for possible cystitis but no improvement in mental status. EEG negative for seizure but encephalopathy.  Neurology consulted on 11/22, and started high-dose thiamine and recommended transfer to [facility] for LTM EEG.   Consultants: Neurology  Procedures: Continuous EEG    Subjective/Interval History: Patient with leftward gaze.  Does not really answer any questions.     Assessment/Plan:  Acute encephalopathy/possible concussion Per family, at baseline patient walks with walker, feed herself, requires assistance for bathing, has slow speech with some dysarthria/expressive aphasia at baseline.  She had worsening left gaze.   Encephalopathy work-up including CT head, MRI brain, B12, TSH, ammonia and blood gas negative.   EEG showed encephalopathy but no seizure or epileptiform discharge.  Reportedly had a fall on 11/14 raising concern for possible concussion syndrome. Neurology was consulted.  Patient started on high-dose thiamine.  Transferred to [facility] for continuous EEG. No epileptiform activity noted.  Long-term monitoring has been discontinued. Neurology feels that her encephalopathy is multifactorial in etiology.  Fall has contributed with progressive delirium along with UTI  and postconcussion syndrome. Patient was started on melatonin. Neurology anticipate that recovery will take several weeks. Seen by physical therapy and skilled nursing facility is recommended.  Oropharyngeal dysphagia Speech therapy is following.  Patient currently on full liquid diet.  To undergo modified barium swallow at some point in time.  Hopefully her diet can be advanced. Given IV fluids for 24 hours.   Possible UTI Urine culture negative.  Treated with ceftriaxone for 3 days At home she was on Macrobid for history of recurrent UTI   Chronic A-fib Continue home Tikosyn and Eliquis. Monitor electrolytes.  Keep potassium greater than 4 and magnesium >= 2.    Uncontrolled IDDM-2 with hyperglycemia A1c 7.0% on 11/19.   Started on basal insulin.  Will increase the dose today as CBGs are not well-controlled.  Continue SSI.     History of chronic diastolic CHF Appears euvolemic. Continue holding home Lasix.  Seems to be slightly hypovolemic likely because of lack of adequate oral intake over the past few days.  Will give her IV fluids as mentioned above.   History of CAD/mild troponin elevation Troponin x2-22 and 23.  Abnormal but not consistent with ACS pattern.  No major events on telemetry.   Osteoporosis. Recently received Reclast prior to admission.   Essential hypertension Blood pressure reasonably well-controlled.  Continue losartan.   Mood disorder. Resume home Cymbalta.   Overactive bladder. Continue home Myrbetriq   Mnire's disease. Monitor.   History of COPD: Stable.   Renal cyst: 12 mm right kidney cyst noted on CT.  Unchanged from 2021. -Renal MRI protocol outpatient recommended.   DVT Prophylaxis: On Eliquis Code Status: Full code Family Communication: Will discuss with daughter today. Yesterday she wanted to have the patient transferred to another facility for second opinion. Disposition Plan: SNF recommended by PT  Status is:  Inpatient Remains  inpatient appropriate because: Altered mental status      Medications: Scheduled:  apixaban  5 mg Oral BID   dofetilide  250 mcg Oral BID   DULoxetine  30 mg Oral Daily   feeding supplement  237 mL Oral TID BM   insulin aspart  0-5 Units Subcutaneous QHS   insulin aspart  0-6 Units Subcutaneous TID WC   insulin glargine-yfgn  10 Units Subcutaneous Daily   losartan  50 mg Oral Daily   melatonin  3 mg Oral QHS   mirabegron ER  50 mg Oral QHS   multivitamin with minerals  1 tablet Oral Daily   mouth rinse  15 mL Mouth Rinse 4 times per day   polyvinyl alcohol  1 drop Both Eyes BID   potassium chloride SA  20 mEq Oral Daily   [START ON 01/19/2022] thiamine (VITAMIN B1) injection  100 mg Intravenous Daily   Continuous:  sodium chloride 75 mL/hr at 01/15/22 0831   thiamine (VITAMIN B1) injection 250 mg (01/15/22 0836)   SKA:[id number] **OR** acetaminophen, food thickener, hydrALAZINE, mouth rinse  Antibiotics: Anti-infectives (From admission, onward)    Start     Dose/Rate Route Frequency Ordered Stop   01/06/22 2200  cefTRIAXone (ROCEPHIN) 1 g in sodium chloride 0.9 % 100 mL IVPB  Status:  Discontinued        1 g 200 mL/hr over 30 Minutes Intravenous Every 24 hours 01/06/22 0851 01/08/22 0902   01/06/22 1700  fluconazole (DIFLUCAN) tablet 150 mg        150 mg Oral  Once 01/06/22 1407 01/06/22 1724   01/05/22 2230  cefTRIAXone (ROCEPHIN) 1 g in sodium chloride 0.9 % 100 mL IVPB        1 g 200 mL/hr over 30 Minutes Intravenous  Once 01/05/22 2216 01/06/22 0122       Objective:  Vital Signs  Vitals:   01/14/22 2126 01/14/22 2327 01/15/22 0317 01/15/22 0836  BP: 110/71 124/64 137/67   Pulse:  67 62 69  Resp:    18  Temp:  98.5 F (36.9 C) 98.7 F (37.1 C) 97.6 F (36.4 C)  TempSrc:  Oral Oral Axillary  SpO2:  96% 95% 95%  Weight:      Height:        Intake/Output Summary (Last 24 hours) at 01/15/2022 1048 Last data filed at 01/15/2022 5974 Gross per 24 hour   Intake 1680 ml  Output 1300 ml  Net 380 ml    Filed Weights   01/10/22 0500 01/11/22 0500 01/13/22 0353  Weight: 56.6 kg 56.9 kg 55.4 kg    General appearance: Noted to be awake with eyes open with a leftward gaze.  Does not really answer questions.  She did raise her legs when asked to. Resp: Clear to auscultation bilaterally.  Normal effort Cardio: S1-S2 is normal regular.  No S3-S4.  No rubs murmurs or bruit GI: Abdomen is soft.  Nontender nondistended.  Bowel sounds are present normal.  No masses organomegaly Extremities: No edema.    Lab Results:  Data Reviewed: I have personally reviewed following labs and reports of the imaging studies  CBC: Recent Labs  Lab 01/11/22 0543 01/13/22 0341  WBC 6.5 6.7  HGB 15.8* 16.1*  HCT 47.7* 46.5*  MCV 92.8 92.6  PLT 300 289     Basic Metabolic Panel: Recent Labs  Lab 01/10/22 0524 01/11/22 0543 01/13/22 0341 01/14/22 0347 01/15/22 0350  NA 140 140 144 141  135  K 4.2 4.1 4.4 3.9 4.4  CL 111 109 107 105 101  CO2 21* '22 25 27 24  '$ GLUCOSE 143* 153* 204* 181* 216*  BUN 24* 27* 29* 27* 30*  CREATININE 0.46 0.68 0.54 0.60 0.58  CALCIUM 8.5* 8.7* 9.6 9.0 8.9  MG  --  2.4 2.2 2.2 2.0  PHOS  --  3.4  --   --   --      GFR: Estimated Creatinine Clearance: 54.8 mL/min (by C-G formula based on SCr of 0.58 mg/dL).  Liver Function Tests: Recent Labs  Lab 01/11/22 0543  ALBUMIN 3.4*     CBG: Recent Labs  Lab 01/14/22 0622 01/14/22 1242 01/14/22 1550 01/14/22 2147 01/15/22 0607  GLUCAP 169* 263* 398* 246* 233*      Recent Results (from the past 240 hour(s))  Resp Panel by RT-PCR (Flu A&B, Covid) Anterior Nasal Swab     Status: None   Collection Time: 01/05/22  6:57 PM   Specimen: Anterior Nasal Swab  Result Value Ref Range Status   SARS Coronavirus 2 by RT PCR NEGATIVE NEGATIVE Final    Comment: (NOTE) SARS-CoV-2 target nucleic acids are NOT DETECTED.  The SARS-CoV-2 RNA is generally detectable in upper  respiratory specimens during the acute phase of infection. The lowest concentration of SARS-CoV-2 viral copies this assay can detect is 138 copies/mL. A negative result does not preclude SARS-Cov-2 infection and should not be used as the sole basis for treatment or other patient management decisions. A negative result may occur with  improper specimen collection/handling, submission of specimen other than nasopharyngeal swab, presence of viral mutation(s) within the areas targeted by this assay, and inadequate number of viral copies(<138 copies/mL). A negative result must be combined with clinical observations, patient history, and epidemiological information. The expected result is Negative.  Fact Sheet for Patients:  EntrepreneurPulse.com.au  Fact Sheet for Healthcare Providers:  IncredibleEmployment.be  This test is no t yet approved or cleared by the [place] FDA and  has been authorized for detection and/or diagnosis of SARS-CoV-2 by FDA under an Emergency Use Authorization (EUA). This EUA will remain  in effect (meaning this test can be used) for the duration of the COVID-19 declaration under Section 564(b)(1) of the Act, 21 U.S.C.section 360bbb-3(b)(1), unless the authorization is terminated  or revoked sooner.       Influenza A by PCR NEGATIVE NEGATIVE Final   Influenza B by PCR NEGATIVE NEGATIVE Final    Comment: (NOTE) The Xpert Xpress SARS-CoV-2/FLU/RSV plus assay is intended as an aid in the diagnosis of influenza from Nasopharyngeal swab specimens and should not be used as a sole basis for treatment. Nasal washings and aspirates are unacceptable for Xpert Xpress SARS-CoV-2/FLU/RSV testing.  Fact Sheet for Patients: EntrepreneurPulse.com.au  Fact Sheet for Healthcare Providers: IncredibleEmployment.be  This test is not yet approved or cleared by the [place] FDA and has been  authorized for detection and/or diagnosis of SARS-CoV-2 by FDA under an Emergency Use Authorization (EUA). This EUA will remain in effect (meaning this test can be used) for the duration of the COVID-19 declaration under Section 564(b)(1) of the Act, 21 U.S.C. section 360bbb-3(b)(1), unless the authorization is terminated or revoked.  Performed at [facility], [place] [address] [place],  [address]   Urine Culture     Status: None   Collection Time: 01/05/22 10:19 PM   Specimen: Urine, Clean Catch  Result Value Ref Range Status   Specimen Description  Final    URINE, CLEAN CATCH Performed at [facility], [place] [address], [place], [place] [address]    Special Requests   Final    NONE Performed at [facility], [place] [address], [place], [place] [address]    Culture   Final    NO GROWTH Performed at [facility], [place] [address], [place],  [address]    Report Status 01/07/2022 FINAL  Final      Radiology Studies: No results found.     LOS: 9 days   Puja Caffey [employer] on [email or web address]  01/15/2022, 10:48 AM

## 2022-01-15 NOTE — Care Management Important Message (Signed)
Important Message  Patient Details  Name: Melanie Lucas MRN: [id number] Date of Birth: [birth year]   Medicare Important Message Given:  Yes     Orbie Pyo 01/15/2022, 2:29 PM

## 2022-01-16 ENCOUNTER — Inpatient Hospital Stay (HOSPITAL_COMMUNITY): Payer: Medicare PPO

## 2022-01-16 DIAGNOSIS — I10 Essential (primary) hypertension: Secondary | ICD-10-CM | POA: Diagnosis not present

## 2022-01-16 DIAGNOSIS — G9340 Encephalopathy, unspecified: Secondary | ICD-10-CM | POA: Diagnosis not present

## 2022-01-16 DIAGNOSIS — I480 Paroxysmal atrial fibrillation: Secondary | ICD-10-CM | POA: Diagnosis not present

## 2022-01-16 LAB — BASIC METABOLIC PANEL
Anion gap: 9 (ref 5–15)
BUN: 21 mg/dL (ref 8–23)
CO2: 24 mmol/L (ref 22–32)
Calcium: 8.1 mg/dL — ABNORMAL LOW (ref 8.9–10.3)
Chloride: 103 mmol/L (ref 98–111)
Creatinine, Ser: 0.54 mg/dL (ref 0.44–1.00)
GFR, Estimated: >60 mL/min (ref >60)
Glucose, Bld: 150 mg/dL — ABNORMAL HIGH (ref 70–99)
Potassium: 3.8 mmol/L (ref 3.5–5.1)
Sodium: 136 mmol/L (ref 135–145)

## 2022-01-16 LAB — GLUCOSE, CAPILLARY
Glucose-Capillary: 164 mg/dL — ABNORMAL HIGH (ref 70–99)
Glucose-Capillary: 178 mg/dL — ABNORMAL HIGH (ref 70–99)
Glucose-Capillary: 307 mg/dL — ABNORMAL HIGH (ref 70–99)
Glucose-Capillary: 311 mg/dL — ABNORMAL HIGH (ref 70–99)

## 2022-01-16 LAB — MAGNESIUM: Magnesium: 2 mg/dL (ref 1.7–2.4)

## 2022-01-16 MED ORDER — POTASSIUM CHLORIDE CRYS ER 20 MEQ PO TBCR
20.0000 meq | EXTENDED_RELEASE_TABLET | Freq: Once | ORAL | Status: AC
  Administered 2022-01-16: 20 meq via ORAL
  Filled 2022-01-16: qty 1

## 2022-01-16 MED ORDER — SODIUM CHLORIDE 0.45 % IV SOLN: INTRAVENOUS | Status: AC

## 2022-01-16 NOTE — TOC Progression Note (Signed)
Transition of Care [facility]) - Progression Note    Patient Details  Name: Melanie Lucas MRN: [id number] Date of Birth: 05-06-1950  Transition of Care [facility]) CM/SW [place], [place] Phone Number: 01/16/2022, 1:26 PM  Clinical Narrative:    Pt's family has chosen [facility] and authorization has been approved.  Blaine Hamper ID [id number] 11/29- 12/1 VM left for Admissions at facility. TOC will continue to follow for DC needs.      Expected Discharge Plan and Services                                                 Social Determinants of Health (SDOH) Interventions Food Insecurity Interventions: Intervention Not Indicated Housing Interventions: Intervention Not Indicated Transportation Interventions: Intervention Not Indicated Utilities Interventions: Intervention Not Indicated  Readmission Risk Interventions    01/10/2022    3:00 PM 08/01/2021   10:59 AM  Readmission Risk Prevention Plan  Transportation Screening Complete Complete  PCP or Specialist Appt within 5-7 Days  Complete  PCP or Specialist Appt within 3-5 Days Complete   Home Care Screening  Complete  Medication Review (RN CM)  Complete  HRI or Home Care Consult Complete   Social Work Consult for [place] Planning/Counseling Complete   Palliative Care Screening Not Applicable   Medication Review [employer]) Complete

## 2022-01-16 NOTE — Progress Notes (Signed)
TR[facility] night cross cover note:   9 beat run on NSVT on tele, with HR into the 150's at that time. Spontaneously converted back to SR, with ensuing HR's in the 60's.  Patient asymptomatic at the time.  Blood pressure stable.  I confirmed existing orders for BMP and serum magnesium level to be checked in the morning.     Babs Bertin, DO Hospitalist

## 2022-01-16 NOTE — Progress Notes (Signed)
Modified Barium Swallow Progress Note  Patient Details  Name: Melanie Lucas MRN: [id number] Date of Birth: [birth year]  Today's Date: 01/16/2022  Modified Barium Swallow completed.  Full report located under Chart Review in the Imaging Section.  Brief recommendations include the following:  Clinical Impression  Patient is exhbiting a moderate-severe oral dysphagia and a moderate pharyngeal dysphagia as per this MBS. Cognitive component highly likely and appear to be significantly impacting both oral and pharyngeal phases of swallow. During oral phase, it did appear that she did not like the taste of the thin liquid barium as she held it in her mouth for a prolonged period of time but with nectar thick liquids and puree solids barium, oral transit was more timely. She exhibited piecemeal swallowing with puree solids, nectar thick and thin liquids. Swallow intiation was delayed at level of vallecular sinus with puree solids and nectar thick liquids. Trace aspiration occured during the swallow with thin liquids, which was not sensed by patient. Flash penetration occured with nectar thick liquids (PAS 2) but no aspiration. Post initial swallows, patient with trace to mild vallecular residuals with puree solids and nectar thick liquids and trace residuals in pyriform sinus with puree and nectar thick. SLP recommending to continue with full liquids, nectar thick and will follow for ability to advance. Patient is at high risk of malnutrition and dehydration.   Swallow Evaluation Recommendations       SLP Diet Recommendations: Other (Comment);Nectar thick liquid (full liquids, nectar thick)   Liquid Administration via: Cup;Spoon   Medication Administration: Crushed with puree   Supervision: Full supervision/cueing for compensatory strategies;Staff to assist with self feeding   Compensations: Small sips/bites;Slow rate;Minimize environmental distractions   Postural Changes: Seated upright at 90  degrees   Oral Care Recommendations: Oral care BID;Staff/trained caregiver to provide oral care;Oral care before and after PO       Sonia Baller, MA, CCC-SLP Speech Therapy

## 2022-01-16 NOTE — Progress Notes (Signed)
Physical Therapy Treatment Patient Details Name: Melanie Lucas MRN: [id number] DOB: 07-Jan-1951 Today's Date: 01/16/2022   History of Present Illness Melanie Lucas is a 71 yr old female admitted to the hospital 01-05-2022 with AMS and recent falls. She was found to have UTI and acute encephalopathy. PMH: OA, TIA, a fib, CVA, chronic diastolic CHF, lymphedema, COPD, CAD, deafness R ear, DM II, peripheral neuropathy    PT Comments    Patient alert on arrival. Replied, "hello" and "not bad" when asked how she was doing. Able to state her name. Left gaze preference, but turning head/eyes to her right to address person on her right. Patient followed numerous simple commands (lift leg, roll to this side, sit up, stand up). Patient required up to max assist for sitting balance at EOB, but had periods of minguarding assist for up to 5 seconds at a time. Bil hand-held assist for come to stand with strong posterior lean. Patient able to demonstrate ability to participate and make progress this date.     Recommendations for follow up therapy are one component of a multi-disciplinary discharge planning process, led by the attending physician.  Recommendations may be updated based on patient status, additional functional criteria and insurance authorization.  Follow Up Recommendations  Skilled nursing-short term rehab (<3 hours/day) (unsure of what assist level family can provide) Can patient physically be transported by private vehicle: No   Assistance Recommended at Discharge Frequent or constant Supervision/Assistance  Patient can return home with the following Two people to help with walking and/or transfers;A lot of help with bathing/dressing/bathroom;Assistance with cooking/housework;Assistance with feeding;Assist for transportation;Help with stairs or ramp for entrance;Direct supervision/assist for medications management;Direct supervision/assist for financial management   Equipment Recommendations  None  recommended by PT (unsure of what pt has at home or will need)    Recommendations for Other Services       Precautions / Restrictions Precautions Precautions: Fall Precaution Comments: deafness rt ear Restrictions Weight Bearing Restrictions: No     Mobility  Bed Mobility Overal bed mobility: Needs Assistance Bed Mobility: Rolling, Sidelying to Sit, Sit to Sidelying Rolling: Mod assist Sidelying to sit: Max assist     Sit to sidelying: Mod assist General bed mobility comments: pt verbalizing and following most commands (with delay)    Transfers Overall transfer level: Needs assistance Equipment used: 2 person hand held assist Transfers: Sit to/from Stand Sit to Stand: Mod assist, +2 physical assistance           General transfer comment: Stood from EOB with tactile and verbal cues to engage in activity. Stood ~60 seconds with posterior bias and feet began to slide forward    Ambulation/Gait               General Gait Details: unable   Chief Strategy Officer    Modified Rankin (Stroke Patients Only)       Balance Overall balance assessment: Needs assistance Sitting-balance support: Feet supported, Bilateral upper extremity supported Sitting balance-Leahy Scale: Poor Sitting balance - Comments: EOB ~5 minutes with left lean but able to progress to brief periods (~5 seconds) of maitaining midline with close-guarding. Postural control: Left lateral lean, Posterior lean Standing balance support: Bilateral upper extremity supported, Reliant on assistive device for balance Standing balance-Leahy Scale: Poor  Cognition Arousal/Alertness: Awake/alert Behavior During Therapy: Flat affect Overall Cognitive Status: No family/caregiver present to determine baseline cognitive functioning Area of Impairment: Following commands, Attention                   Current Attention Level:  Sustained   Following Commands: Follows one step commands inconsistently     Problem Solving: Slow processing, Decreased initiation, Difficulty sequencing, Requires verbal cues, Requires tactile cues General Comments: Followed numerous one step commands (lift your leg, roll this way, stand up)        Exercises      General Comments        Pertinent Vitals/Pain Pain Assessment Pain Assessment: Faces Faces Pain Scale: No hurt    Home Living                          Prior Function            PT Goals (current goals can now be found in the care plan section) Acute Rehab PT Goals Patient Stated Goal: none Time For Goal Achievement: 01/24/22 Potential to Achieve Goals: Poor Progress towards PT goals: Progressing toward goals    Frequency    Min 2X/week      PT Plan Current plan remains appropriate    Co-evaluation              AM-PAC PT "6 Clicks" Mobility   Outcome Measure  Help needed turning from your back to your side while in a flat bed without using bedrails?: A Lot Help needed moving from lying on your back to sitting on the side of a flat bed without using bedrails?: A Lot Help needed moving to and from a bed to a chair (including a wheelchair)?: Total Help needed standing up from a chair using your arms (e.g., wheelchair or bedside chair)?: Total Help needed to walk in hospital room?: Total Help needed climbing 3-5 steps with a railing? : Total 6 Click Score: 8    End of Session   Activity Tolerance: Patient tolerated treatment well Patient left: in bed;with bed alarm set;with call bell/phone within reach Nurse Communication: Mobility status PT Visit Diagnosis: Muscle weakness (generalized) (M62.81);History of falling (Z91.81)     Time: 3338-3291 PT Time Calculation (min) (ACUTE ONLY): 31 min  Charges:  $Therapeutic Activity: 23-37 mins                      Arby Barrette, PT Acute Rehabilitation Services  Office  [phone]    Rexanne Mano 01/16/2022, 2:19 PM

## 2022-01-16 NOTE — Progress Notes (Signed)
[facility] PROGRESS NOTE   Melanie Lucas JOI:[id number] DOB: Oct 01, 1950 DOA: 01/05/2022  PCP: Sue Lush, PA-C  Brief History/Interval Summary: 71 year old F with PMH of COPD, CAD, Mnire's disease with right ear deafness, type II DM, neuropathy, left carotid stenosis, HLD, osteoporosis, paroxysmal A-fib on Tikosyn/Eliquis, anxiety, depression and former smoker presented to the ER with complaints of confusion, decreased oral intake, decreased interaction and excessive sleepiness, and admitted for acute encephalopathy and possible concussion syndrome. Encephalopathy work-up including CT head, MRI brain, B12, TSH, free T4, ammonia and VBG unrevealing. Completed 3 days of IV antibiotics for possible cystitis but no improvement in mental status. EEG negative for seizure but encephalopathy.  Neurology consulted on 11/22, and started high-dose thiamine and recommended transfer to [facility] for LTM EEG.   Consultants: Neurology  Procedures: Continuous EEG    Subjective/Interval History: Patient did respond to me when I greeted her this morning.  However did not answer any further questions.  Continues to have a leftward gaze.  Did follow some commands this morning.    Assessment/Plan:  Acute encephalopathy/possible concussion Per family, at baseline patient walks with walker, feed herself, requires assistance for bathing, has slow speech with some dysarthria/expressive aphasia at baseline.  She had worsening left gaze.   Encephalopathy work-up including CT head, MRI brain, B12, TSH, ammonia and blood gas negative.   EEG showed encephalopathy but no seizure or epileptiform discharge.  Reportedly had a fall on 11/14 raising concern for possible concussion syndrome. Neurology was consulted.  Patient started on high-dose thiamine.  Transferred to [facility] for continuous EEG. No epileptiform activity noted.  Long-term monitoring has been discontinued. Neurology feels that  her encephalopathy is multifactorial in etiology.  Fall has contributed with progressive delirium along with UTI and postconcussion syndrome. Patient was started on melatonin. Neurology anticipate that recovery will take several weeks. Seen by physical therapy and skilled nursing facility is recommended.  Oropharyngeal dysphagia Speech therapy is following.  Patient currently on full liquid diet.   Patient to undergo modified barium swallow today.  IV fluids to continue for another 12 hours.  BUN is noted to be better.     Possible UTI Urine culture negative.  Treated with ceftriaxone for 3 days At home she was on Macrobid for history of recurrent UTI   Chronic A-fib Continue home Tikosyn and Eliquis. Monitor electrolytes.  Keep potassium greater than 4 and magnesium >= 2.  Will supplement potassium today.  Brief run of NSVT noted on telemetry overnight.   Uncontrolled IDDM-2 with hyperglycemia A1c 7.0% on 11/19.   Started on basal insulin with the dose adjusted yesterday.  CBGs appear to be better controlled today. Continue SSI.     History of chronic diastolic CHF Lasix on hold.  Had to be given IV fluids due to poor oral intake and some concern for hypovolemia.  Seems to be better today.     History of CAD/mild troponin elevation Troponin x2-22 and 23.  Abnormal but not consistent with ACS pattern.  No major events on telemetry.   Osteoporosis. Recently received Reclast prior to admission.   Essential hypertension Blood pressure reasonably well-controlled.  Continue losartan.   Mood disorder. Resume home Cymbalta.   Overactive bladder. Continue home Myrbetriq   Mnire's disease. Monitor.   History of COPD: Stable.   Renal cyst: 12 mm right kidney cyst noted on CT.  Unchanged from 2021. -Renal MRI protocol outpatient recommended.   DVT Prophylaxis: On Eliquis Code Status:  Full code Family Communication: Will update daughter today. Disposition Plan: SNF  recommended by PT  Status is: Inpatient Remains inpatient appropriate because: Altered mental status      Medications: Scheduled:  apixaban  5 mg Oral BID   dofetilide  250 mcg Oral BID   DULoxetine  30 mg Oral Daily   feeding supplement  237 mL Oral TID BM   insulin aspart  0-15 Units Subcutaneous TID WC   insulin aspart  0-5 Units Subcutaneous QHS   insulin glargine-yfgn  10 Units Subcutaneous Daily   losartan  50 mg Oral Daily   melatonin  3 mg Oral QHS   mirabegron ER  50 mg Oral QHS   multivitamin with minerals  1 tablet Oral Daily   mouth rinse  15 mL Mouth Rinse 4 times per day   polyvinyl alcohol  1 drop Both Eyes BID   potassium chloride SA  20 mEq Oral Daily   potassium chloride  20 mEq Oral Once   [START ON 01/19/2022] thiamine (VITAMIN B1) injection  100 mg Intravenous Daily   Continuous:  sodium chloride     thiamine (VITAMIN B1) injection Stopped (01/15/22 0906)   BPZ:WCHENIDPOEUMP **OR** acetaminophen, food thickener, hydrALAZINE, mouth rinse  Antibiotics: Anti-infectives (From admission, onward)    Start     Dose/Rate Route Frequency Ordered Stop   01/06/22 2200  cefTRIAXone (ROCEPHIN) 1 g in sodium chloride 0.9 % 100 mL IVPB  Status:  Discontinued        1 g 200 mL/hr over 30 Minutes Intravenous Every 24 hours 01/06/22 0851 01/08/22 0902   01/06/22 1700  fluconazole (DIFLUCAN) tablet 150 mg        150 mg Oral  Once 01/06/22 1407 01/06/22 1724   01/05/22 2230  cefTRIAXone (ROCEPHIN) 1 g in sodium chloride 0.9 % 100 mL IVPB        1 g 200 mL/hr over 30 Minutes Intravenous  Once 01/05/22 2216 01/06/22 0122       Objective:  Vital Signs  Vitals:   01/16/22 0016 01/16/22 0343 01/16/22 0456 01/16/22 0814  BP: 100/65 114/69  (!) 131/91  Pulse: (!) 57 (!) 55  64  Resp: '16 14  16  '$ Temp: 97.6 F (36.4 C) 98.2 F (36.8 C)  98.1 F (36.7 C)  TempSrc: Oral Oral  Oral  SpO2: 99% 96%  98%  Weight:   60.1 kg   Height:        Intake/Output Summary  (Last 24 hours) at 01/16/2022 1041 Last data filed at 01/16/2022 0530 Gross per 24 hour  Intake 949.39 ml  Output 1400 ml  Net -450.61 ml    Filed Weights   01/11/22 0500 01/13/22 0353 01/16/22 0456  Weight: 56.9 kg 55.4 kg 60.1 kg    General appearance: Noted to be awake eyes open.  Does not respond to questions consistently.  Does follow certain commands. Resp: Clear to auscultation bilaterally.  Normal effort Cardio: S1-S2 is normal regular.  No S3-S4.  No rubs murmurs or bruit GI: Abdomen is soft.  Nontender nondistended.  Bowel sounds are present normal.  No masses organomegaly Extremities: No edema.  Physical deconditioning is noted.  Lab Results:  Data Reviewed: I have personally reviewed following labs and reports of the imaging studies  CBC: Recent Labs  Lab 01/11/22 0543 01/13/22 0341  WBC 6.5 6.7  HGB 15.8* 16.1*  HCT 47.7* 46.5*  MCV 92.8 92.6  PLT 300 289     Basic Metabolic  Panel: Recent Labs  Lab 01/11/22 0543 01/13/22 0341 01/14/22 0347 01/15/22 0350 01/16/22 0452  NA 140 144 141 135 136  K 4.1 4.4 3.9 4.4 3.8  CL 109 107 105 101 103  CO2 '22 25 27 24 24  '$ GLUCOSE 153* 204* 181* 216* 150*  BUN 27* 29* 27* 30* 21  CREATININE 0.68 0.54 0.60 0.58 0.54  CALCIUM 8.7* 9.6 9.0 8.9 8.1*  MG 2.4 2.2 2.2 2.0 2.0  PHOS 3.4  --   --   --   --      GFR: Estimated Creatinine Clearance: 59.4 mL/min (by C-G formula based on SCr of 0.54 mg/dL).  Liver Function Tests: Recent Labs  Lab 01/11/22 0543  ALBUMIN 3.4*     CBG: Recent Labs  Lab 01/15/22 0607 01/15/22 1227 01/15/22 1600 01/15/22 2111 01/16/22 0611  GLUCAP 233* 245* 184* 203* 164*     Radiology Studies: No results found.     LOS: 10 days   Krysta Bloomfield [employer] on [email or web address]  01/16/2022, 10:41 AM

## 2022-01-17 DIAGNOSIS — I6921 Attention and concentration deficit following other nontraumatic intracranial hemorrhage: Secondary | ICD-10-CM | POA: Diagnosis not present

## 2022-01-17 DIAGNOSIS — I69214 Frontal lobe and executive function deficit following other nontraumatic intracranial hemorrhage: Secondary | ICD-10-CM | POA: Diagnosis not present

## 2022-01-17 DIAGNOSIS — N390 Urinary tract infection, site not specified: Secondary | ICD-10-CM | POA: Diagnosis not present

## 2022-01-17 DIAGNOSIS — E1151 Type 2 diabetes mellitus with diabetic peripheral angiopathy without gangrene: Secondary | ICD-10-CM | POA: Diagnosis not present

## 2022-01-17 DIAGNOSIS — I10 Essential (primary) hypertension: Secondary | ICD-10-CM | POA: Diagnosis not present

## 2022-01-17 DIAGNOSIS — I70201 Unspecified atherosclerosis of native arteries of extremities, right leg: Secondary | ICD-10-CM | POA: Diagnosis not present

## 2022-01-17 DIAGNOSIS — R296 Repeated falls: Secondary | ICD-10-CM | POA: Diagnosis not present

## 2022-01-17 DIAGNOSIS — R531 Weakness: Secondary | ICD-10-CM | POA: Diagnosis not present

## 2022-01-17 DIAGNOSIS — M25552 Pain in left hip: Secondary | ICD-10-CM | POA: Diagnosis not present

## 2022-01-17 DIAGNOSIS — I69222 Dysarthria following other nontraumatic intracranial hemorrhage: Secondary | ICD-10-CM | POA: Diagnosis not present

## 2022-01-17 DIAGNOSIS — I69292 Facial weakness following other nontraumatic intracranial hemorrhage: Secondary | ICD-10-CM | POA: Diagnosis not present

## 2022-01-17 DIAGNOSIS — J449 Chronic obstructive pulmonary disease, unspecified: Secondary | ICD-10-CM | POA: Diagnosis not present

## 2022-01-17 DIAGNOSIS — R52 Pain, unspecified: Secondary | ICD-10-CM | POA: Diagnosis not present

## 2022-01-17 DIAGNOSIS — I69220 Aphasia following other nontraumatic intracranial hemorrhage: Secondary | ICD-10-CM | POA: Diagnosis not present

## 2022-01-17 DIAGNOSIS — I5032 Chronic diastolic (congestive) heart failure: Secondary | ICD-10-CM | POA: Diagnosis not present

## 2022-01-17 DIAGNOSIS — F0781 Postconcussional syndrome: Secondary | ICD-10-CM | POA: Diagnosis not present

## 2022-01-17 DIAGNOSIS — D6869 Other thrombophilia: Secondary | ICD-10-CM | POA: Diagnosis not present

## 2022-01-17 DIAGNOSIS — G9340 Encephalopathy, unspecified: Secondary | ICD-10-CM | POA: Diagnosis not present

## 2022-01-17 DIAGNOSIS — I639 Cerebral infarction, unspecified: Secondary | ICD-10-CM | POA: Diagnosis not present

## 2022-01-17 DIAGNOSIS — R600 Localized edema: Secondary | ICD-10-CM | POA: Diagnosis not present

## 2022-01-17 DIAGNOSIS — I1310 Hypertensive heart and chronic kidney disease without heart failure, with stage 1 through stage 4 chronic kidney disease, or unspecified chronic kidney disease: Secondary | ICD-10-CM | POA: Diagnosis not present

## 2022-01-17 DIAGNOSIS — R404 Transient alteration of awareness: Secondary | ICD-10-CM | POA: Diagnosis not present

## 2022-01-17 DIAGNOSIS — Z79899 Other long term (current) drug therapy: Secondary | ICD-10-CM | POA: Diagnosis not present

## 2022-01-17 DIAGNOSIS — R4701 Aphasia: Secondary | ICD-10-CM | POA: Diagnosis not present

## 2022-01-17 DIAGNOSIS — Z9189 Other specified personal risk factors, not elsewhere classified: Secondary | ICD-10-CM | POA: Diagnosis not present

## 2022-01-17 DIAGNOSIS — E785 Hyperlipidemia, unspecified: Secondary | ICD-10-CM | POA: Diagnosis not present

## 2022-01-17 DIAGNOSIS — R1312 Dysphagia, oropharyngeal phase: Secondary | ICD-10-CM | POA: Diagnosis not present

## 2022-01-17 DIAGNOSIS — I110 Hypertensive heart disease with heart failure: Secondary | ICD-10-CM | POA: Diagnosis not present

## 2022-01-17 DIAGNOSIS — I69218 Other symptoms and signs involving cognitive functions following other nontraumatic intracranial hemorrhage: Secondary | ICD-10-CM | POA: Diagnosis not present

## 2022-01-17 DIAGNOSIS — E1165 Type 2 diabetes mellitus with hyperglycemia: Secondary | ICD-10-CM | POA: Diagnosis not present

## 2022-01-17 DIAGNOSIS — M25562 Pain in left knee: Secondary | ICD-10-CM | POA: Diagnosis not present

## 2022-01-17 DIAGNOSIS — E559 Vitamin D deficiency, unspecified: Secondary | ICD-10-CM | POA: Diagnosis not present

## 2022-01-17 DIAGNOSIS — Z8679 Personal history of other diseases of the circulatory system: Secondary | ICD-10-CM | POA: Diagnosis not present

## 2022-01-17 DIAGNOSIS — F5101 Primary insomnia: Secondary | ICD-10-CM | POA: Diagnosis not present

## 2022-01-17 DIAGNOSIS — Z7401 Bed confinement status: Secondary | ICD-10-CM | POA: Diagnosis not present

## 2022-01-17 DIAGNOSIS — E440 Moderate protein-calorie malnutrition: Secondary | ICD-10-CM | POA: Diagnosis not present

## 2022-01-17 DIAGNOSIS — E1169 Type 2 diabetes mellitus with other specified complication: Secondary | ICD-10-CM | POA: Diagnosis not present

## 2022-01-17 DIAGNOSIS — E860 Dehydration: Secondary | ICD-10-CM | POA: Diagnosis not present

## 2022-01-17 DIAGNOSIS — G4700 Insomnia, unspecified: Secondary | ICD-10-CM | POA: Diagnosis not present

## 2022-01-17 DIAGNOSIS — Z87440 Personal history of urinary (tract) infections: Secondary | ICD-10-CM | POA: Diagnosis not present

## 2022-01-17 DIAGNOSIS — F32A Depression, unspecified: Secondary | ICD-10-CM | POA: Diagnosis not present

## 2022-01-17 DIAGNOSIS — I480 Paroxysmal atrial fibrillation: Secondary | ICD-10-CM | POA: Diagnosis not present

## 2022-01-17 LAB — BASIC METABOLIC PANEL
Anion gap: 7 (ref 5–15)
BUN: 20 mg/dL (ref 8–23)
CO2: 24 mmol/L (ref 22–32)
Calcium: 8.4 mg/dL — ABNORMAL LOW (ref 8.9–10.3)
Chloride: 102 mmol/L (ref 98–111)
Creatinine, Ser: 0.53 mg/dL (ref 0.44–1.00)
GFR, Estimated: >60 mL/min (ref >60)
Glucose, Bld: 195 mg/dL — ABNORMAL HIGH (ref 70–99)
Potassium: 4.1 mmol/L (ref 3.5–5.1)
Sodium: 133 mmol/L — ABNORMAL LOW (ref 135–145)

## 2022-01-17 LAB — CBC
HCT: 42.4 % (ref 36.0–46.0)
Hemoglobin: 14.7 g/dL (ref 12.0–15.0)
MCH: 31.7 pg (ref 26.0–34.0)
MCHC: 34.7 g/dL (ref 30.0–36.0)
MCV: 91.4 fL (ref 80.0–100.0)
Platelets: 267 10*3/uL (ref 150–400)
RBC: 4.64 MIL/uL (ref 3.87–5.11)
RDW: 12.8 % (ref 11.5–15.5)
WBC: 6.1 10*3/uL (ref 4.0–10.5)
nRBC: 0 % (ref 0.0–0.2)

## 2022-01-17 LAB — GLUCOSE, CAPILLARY
Glucose-Capillary: 204 mg/dL — ABNORMAL HIGH (ref 70–99)
Glucose-Capillary: 167 mg/dL — ABNORMAL HIGH (ref 70–99)
Glucose-Capillary: 175 mg/dL — ABNORMAL HIGH (ref 70–99)

## 2022-01-17 LAB — MAGNESIUM: Magnesium: 2 mg/dL (ref 1.7–2.4)

## 2022-01-17 MED ORDER — THIAMINE MONONITRATE 100 MG PO TABS: 100.0000 mg | ORAL_TABLET | Freq: Every day | ORAL | 4 refills | Status: AC

## 2022-01-17 MED ORDER — ADULT MULTIVITAMIN W/MINERALS CH: 1.0000 | ORAL_TABLET | Freq: Every day | ORAL | 4 refills | Status: AC

## 2022-01-17 MED ORDER — ENSURE ENLIVE PO LIQD: 237.0000 mL | Freq: Three times a day (TID) | ORAL | 12 refills | Status: AC

## 2022-01-17 MED ORDER — INSULIN GLARGINE 100 UNIT/ML ~~LOC~~ SOLN: 10.0000 [IU] | Freq: Every day | SUBCUTANEOUS | 0 refills | Status: AC

## 2022-01-17 MED ORDER — POTASSIUM CHLORIDE CRYS ER 20 MEQ PO TBCR
40.0000 meq | EXTENDED_RELEASE_TABLET | Freq: Once | ORAL | Status: AC
  Administered 2022-01-17: 40 meq via ORAL
  Filled 2022-01-17: qty 2

## 2022-01-17 NOTE — Progress Notes (Signed)
Report called to [facility].  Discharge paperwork has been printed for transport.  Peripheral IV and telemetry has been removed.

## 2022-01-17 NOTE — Discharge Summary (Signed)
Physician Discharge Summary  Melanie Lucas:[id number] DOB: [birth year] DOA: 01/05/2022  PCP: Sue Lush, PA-C  Admit date: 01/05/2022 Discharge date: 01/17/2022  Admitted From: Home Disposition: SNF  Recommendations for Outpatient Follow-up:  Follow up with PCP in 1-2 weeks Follow-up with neurology as scheduled  Discharge Condition: Stable CODE STATUS: Full Diet recommendation: Nectar thick liquid/pured diet, heart healthy  Brief/Interim Summary: 71 year old F with PMH of COPD, CAD, Mnire's disease with right ear deafness, type II DM, neuropathy, left carotid stenosis, HLD, osteoporosis, paroxysmal A-fib on Tikosyn/Eliquis, anxiety, depression and former smoker presented to the ER with complaints of confusion, decreased oral intake, decreased interaction and excessive sleepiness, and admitted for acute encephalopathy and possible concussion syndrome. Encephalopathy work-up including CT head, MRI brain, B12, TSH, free T4, ammonia and VBG unrevealing. Completed 3 days of IV antibiotics for possible cystitis but no improvement in mental status. EEG negative for seizure but encephalopathy.  Neurology consulted on 11/22, and started high-dose thiamine and recommended transfer to [facility] for LTM EEG.   As below patient's imaging and labs are essentially unremarkable, treated as possible concussion with concurrent UTI versus metabolic syndrome.  Her labs and imaging are otherwise unrevealing.  Continue to advance diet and continue physical and speech therapy as recommended at discharge.  Otherwise stable and agreeable for discharge to facility.  POA updated over the phone today at length about patient's clinical course and diagnosis.   Discharge Diagnoses:  Principal Problem:   Acute encephalopathy Active Problems:   Atrial fibrillation (HCC)   Acute metabolic encephalopathy   Chronic diastolic CHF (congestive heart failure) (HCC)   Essential hypertension, benign   Coronary  artery disease involving native coronary artery of native heart without angina pectoris   Pulmonary hypertension (HCC)   Type 2 diabetes mellitus without complication, without long-term current use of insulin (HCC)   COPD (chronic obstructive pulmonary disease) (HCC)   Depression   Memory loss   Acute cystitis  Acute encephalopathy/possible concussion Per family, at baseline patient walks with walker, feed herself, requires assistance for bathing, has slow speech with some dysarthria/expressive aphasia at baseline. She had worsening left gaze.   Encephalopathy work-up including CT head, MRI brain, B12, TSH, ammonia and blood gas negative. EEG showed encephalopathy but no seizure or epileptiform discharge.  Reportedly had a fall on 11/14 raising concern for possible concussion syndrome. Neurology was consulted.  Patient started on high-dose thiamine.  Transferred to [facility] for continuous EEG. No epileptiform activity noted.  Long-term monitoring has been discontinued. Neurology feels that her encephalopathy is multifactorial in etiology.  Fall has contributed with progressive delirium along with UTI and postconcussion syndrome. Patient was started on melatonin. Neurology anticipate that recovery will take several weeks. Seen by physical therapy and skilled nursing facility is recommended.  Oropharyngeal dysphagia Speech therapy is following.  Patient currently on full liquid diet. Patient to undergo modified barium swallow today.  IV fluids to continue for another 12 hours. BUN is noted to be better.  Possible UTI POA, resolved Urine culture negative. Treated with ceftriaxone for 3 days. At home she was on Macrobid for history of recurrent UTI. Discussed possibly discontinuing antibiotics in the outpatient setting for repeat cultures to ensure no resistant or ongoing infection.  Chronic A-fib Continue home Tikosyn and Eliquis. Monitor electrolytes. Keep potassium greater than 4  and magnesium >= 2.  Will supplement potassium today. Brief run of NSVT noted on telemetry overnight.  Uncontrolled IDDM-2 with hyperglycemia A1c 7.0% on  11/19. Started on basal insulin with the dose adjusted yesterday. CBGs appear to be better controlled today. Continue SSI.  History of chronic diastolic CHF Lasix on hold.  Had to be given IV fluids due to poor oral intake and some concern for hypovolemia.  Seems to be better today.    History of CAD/mild troponin elevation Troponin x2-22 and 23.  Abnormal but not consistent with ACS pattern.  No major events on telemetry.  Osteoporosis. Recently received Reclast prior to admission.  Essential hypertension Blood pressure reasonably well-controlled.  Continue losartan.  Mood disorder. Resume home Cymbalta.  Overactive bladder. Continue home Myrbetriq  Mnire's disease. Monitor.  History of COPD: Stable.  Renal cyst:  -12 mm right kidney cyst noted on CT.  Unchanged from 2021. -Renal MRI protocol outpatient recommended.  Discharge Instructions  Allergies as of 01/17/2022       Reactions   Alendronate Sodium Other (See Comments)   pain all over   Avelox [moxifloxacin] Other (See Comments)   Unknown reaction    Fosamax [alendronate] Other (See Comments)   Pain all over   Haldol [haloperidol] Other (See Comments)   Causes more agitation   Pradaxa [dabigatran Etexilate Mesylate] Other (See Comments)   Extreme bleeding   Seroquel [quetiapine]    Causes more agitation.   Statins Nausea And Vomiting, Other (See Comments)   Muscle pain, Dizziness (intolerance)   Sulfa Antibiotics Nausea And Vomiting   Sulfamethoxazole Nausea And Vomiting   Dizziness (intolerance)   Sulfonamide Derivatives Hives   Nausea vertigo   Varenicline Other (See Comments)   nightmares   Ciprofloxacin Rash   Other reaction(s): unknown   Gabapentin Rash   burning   Tape Itching, Rash   Please use "paper" tape only        Medication  List     STOP taking these medications    furosemide 20 MG tablet Commonly known as: LASIX   Lantus SoloStar 100 UNIT/ML Solostar Pen Generic drug: insulin glargine Replaced by: insulin glargine 100 UNIT/ML injection   nitrofurantoin (macrocrystal-monohydrate) 100 MG capsule Commonly known as: MACROBID   Synjardy 12.06-998 MG Tabs Generic drug: Empagliflozin-metFORMIN HCl       TAKE these medications    acetaminophen 325 MG tablet Commonly known as: TYLENOL Take 2 tablets (650 mg total) by mouth every 6 (six) hours as needed for mild pain (or Fever >/= 101).   Baclofen 5 MG Tabs Take 2.5-5 mg by mouth at bedtime as needed.   dofetilide 250 MCG capsule Commonly known as: TIKOSYN Take 1 capsule (250 mcg total) by mouth 2 (two) times daily.   DULoxetine 30 MG capsule Commonly known as: CYMBALTA Take 30 mg by mouth daily.   Eliquis 5 MG Tabs tablet Generic drug: apixaban Take 5 mg by mouth 2 (two) times daily.   ezetimibe 10 MG tablet Commonly known as: ZETIA Take 10 mg by mouth daily.   feeding supplement Liqd Take 237 mLs by mouth 3 (three) times daily between meals.   insulin glargine 100 UNIT/ML injection Commonly known as: Lantus Inject 0.1 mLs (10 Units total) into the skin at bedtime. Replaces: Lantus SoloStar 100 UNIT/ML Solostar Pen   losartan 50 MG tablet Commonly known as: COZAAR TAKE 1 TABLET BY MOUTH EVERY DAY   MELATONIN FORTE/L-THEANINE PO Take by mouth.   mirabegron ER 50 MG Tb24 tablet Commonly known as: MYRBETRIQ Take 50 mg by mouth at bedtime.   multivitamin with minerals Tabs tablet Take 1 tablet by mouth  daily. Start taking on: January 18, 2022   potassium chloride SA 20 MEQ tablet Commonly known as: KLOR-CON M Take 1 tablet (20 mEq total) by mouth daily.   PROBIOTIC PO Take by mouth.   RECLAST IV Inject into the vein.   thiamine 100 MG tablet Commonly known as: VITAMIN B1 Take 1 tablet (100 mg total) by mouth daily.    valACYclovir 500 MG tablet Commonly known as: VALTREX Take 500 mg by mouth as needed.   vitamin B-12 500 MCG tablet Commonly known as: CYANOCOBALAMIN Take 500 mcg by mouth daily.   Vitamin D (Ergocalciferol) 1.25 MG (50000 UNIT) Caps capsule Commonly known as: DRISDOL Take 50,000 Units by mouth every 7 (seven) days.        Contact information for after-discharge care     [facility] .   Service: Skilled Nursing Contact information: [place] [address] [phone]                    Allergies  Allergen Reactions   Alendronate Sodium Other (See Comments)    pain all over   Avelox [Moxifloxacin] Other (See Comments)    Unknown reaction    Fosamax [Alendronate] Other (See Comments)    Pain all over   Haldol [Haloperidol] Other (See Comments)    Causes more agitation   Pradaxa [Dabigatran Etexilate Mesylate] Other (See Comments)    Extreme bleeding   Seroquel [Quetiapine]     Causes more agitation.   Statins Nausea And Vomiting and Other (See Comments)    Muscle pain, Dizziness (intolerance)   Sulfa Antibiotics Nausea And Vomiting   Sulfamethoxazole Nausea And Vomiting    Dizziness (intolerance)   Sulfonamide Derivatives Hives    Nausea vertigo   Varenicline Other (See Comments)    nightmares   Ciprofloxacin Rash    Other reaction(s): unknown   Gabapentin Rash    burning   Tape Itching and Rash    Please use "paper" tape only    Consultations: Neurology  Procedures/Studies: DG Swallowing Func-Speech Pathology  Result Date: 01/16/2022 Table formatting from the original result was not included. Objective Swallowing Evaluation: Type of Study: MBS-Modified Barium Swallow Study  Patient Details Name: Melanie Lucas MRN: [id number] Date of Birth: Jul 23, 1950 Today's Date: 01/16/2022 Time: SLP Start Time (ACUTE ONLY): 1015 -SLP Stop Time (ACUTE ONLY): 1030 SLP Time Calculation  (min) (ACUTE ONLY): 15 min Past Medical History: Past Medical History: Diagnosis Date  Anxiety   Cancer ([place])   Skin- leg   Chronic acquired lymphedema   a. R>L;  b. 03/2012 Neg LE U/S for DVT. Legs since age 72  Complication of anesthesia   woke up during colonoscopy, Lithrostrixpy, Biospy  COPD (chronic obstructive pulmonary disease) ([place])   Coronary artery disease   Deaf, right   Diabetes mellitus   Type II  Diabetic neuropathy ([place])   Elevated TSH   a. 10/2012 - inst to f/u PCP.  Full dentures   Hematuria   a. while on pradaxa,  she reports that she has seen Dr Margie Ege and had low risk cystoscopy  History of kidney stones   Hyperlipidemia   Hypertension   patient denies  Incontinence   prior to urinating  Lacunar infarction [facility])   a. 02/2009 non-acute Lacunar infarct of the right thalamus noted on MRI of brain.  Left carotid stenosis   Lymphedema of extremity   had this problem, since  she was a teenager. especially seen in R LE  Meniere disease   Myocardial infarction [facility])   Obesity   Osteoarthritis   cervical & lumbar region, knees, hands cramp also   PAF with post-termination pauses   a. on dronedarone;  b. CHA2DS2VASc = 5 (HTN, DM, h/o lacunar infarct on MRI, Female) ->refused oral anticoagulation after h/o hematuria on pradaxa;  c. 02/2012 Echo: EF 55-60%, mildly dil LA. D. Recurrent PAF 10/2012 after only taking Multaq 1x/day - spont conv to NSR, placed back on BID Multaq/eliquis;  e. 10/2014 Multaq d/c'd->tikosyn initiated.  Shortness of breath dyspnea   with exertion  Stroke ([place])   2 mini strokes  Tobacco abuse   Vision abnormalities  Past Surgical History: Past Surgical History: Procedure Laterality Date  ANTERIOR CERVICAL CORPECTOMY N/A 02/21/2016  Procedure: ANTERIOR CERVICAL CORPECTOMY ANS FUSION CERVICAL SIX , ANTERIOR PLATING CERVICAL FIVE-SEVEN;  Surgeon: Consuella Lose, MD;  Location: [facility];  Service: Neurosurgery;  Laterality: N/A;  ANTERIOR FUSION CERVICAL SPINE  02/21/2016  Biopsy of adrenal glands     BREAST BIOPSY Bilateral   benign results, both breasts   CARDIAC CATHETERIZATION  2014  x1 stent placed, done in [place]   CARDIOVERSION N/A 11/11/2014  Procedure: CARDIOVERSION;  Surgeon: Larey Dresser, MD;  Location: [place];  Service: Cardiovascular;  Laterality: N/A;  CAROTID ENDARTERECTOMY Left 07/29/2017  COLONOSCOPY W/ POLYPECTOMY    CORONARY STENT PLACEMENT    ENDARTERECTOMY Left 07/29/2017  Procedure: ENDARTERECTOMY CAROTID LEFT;  Surgeon: Rosetta Posner, MD;  Location: [facility];  Service: Vascular;  Laterality: Left;  PATCH ANGIOPLASTY Left 07/29/2017  Procedure: PATCH ANGIOPLASTY USING HEMASHIELD GOLD VASCULAR PATCH;  Surgeon: Rosetta Posner, MD;  Location: [place];  Service: Vascular;  Laterality: Left;  TEE WITHOUT CARDIOVERSION N/A 11/11/2014  Procedure: TRANSESOPHAGEAL ECHOCARDIOGRAM (TEE);  Surgeon: Larey Dresser, MD;  Location: [facility];  Service: Cardiovascular;  Laterality: N/A; HPI: Patient is a 72 y.o. female with PMH: anxiety, depression, CHF, COPD, right ear deafness, DM-2, HTN, HLD, incontinence, left carotid stenosis, h/o CVA's with residual dysarthria, memory issues, balance issues, cognitive impairment (score of 10 out of 30 on SLUMS  07/27/21). She presented to the hospital on 01/05/2022 due to AMS. Patient had fall four days prior to this admission and was discharged from the hospital in stable condition. Daughter reported that since the fall, she has had difficulty talking, following commands, walking and tolerating oral intake. In ED, she was afebrile, RR 18 and saturating at 99% on RA. She was admitted with acute metabolic encephalopathy in the setting of acute cystitis.  Pt underwent BSE  and was placed on dys3/thin diet - downgraded to dys2/thin diet.  Follow up for dysphagia management indicated.  Subjective: awake, alert, requires cueing, significant delays, nonverbal  Recommendations for follow up therapy are one component of a multi-disciplinary discharge planning process,  led by the attending physician.  Recommendations may be updated based on patient status, additional functional criteria and insurance authorization. Assessment / Plan / Recommendation   01/16/2022   1:57 PM Clinical Impressions Clinical Impression Patient is exhbiting a moderate-severe oral dysphagia and a moderate pharyngeal dyshpagia as per this MBS. Cognitive component highly likely and appear to be significantly impacting both oral and pharyngeal phases of swallow. During oral phase, it did appear that she did not like the taste of the thin liquid barium as she held it in her mouth for a prolonged period of time but with nectar thick liquids and  puree solids barium, oral transit was more timely. She exhibited piecemeal swallowing with puree solids, nectar thick and thin liquids. Swallow intiation was delayed at level of vallecular sinus with puree solids and nectar thick liquids. Trace aspiration occured during the swallow with thin liquids, which was not sensed by patient. Flash penetration occured with nectar thick liquids (PAS 2) but no aspiration. Post initial swallows, patient with trace to mild vallecular residuals with puree solids and nectar thick liquids and trace residuals in pyriform sinus with puree and nectar thick. SLP recommending to continue with full liquids, nectar thick and will follow for ability to advance. Patient is at high risk of malnutrition and dehydration.     01/16/2022   1:49 PM Treatment Recommendations Treatment Recommendations Therapy as outlined in treatment plan below     01/16/2022   1:50 PM Prognosis Prognosis for Safe Diet Advancement Guarded Barriers to Reach Goals Cognitive deficits;Severity of deficits;Time post onset   01/16/2022   1:49 PM Diet Recommendations SLP Diet Recommendations Other (Comment);Nectar thick liquid Liquid Administration via Cup;Spoon Medication Administration Crushed with puree Compensations Small sips/bites;Slow rate;Minimize environmental  distractions Postural Changes Seated upright at 90 degrees     01/16/2022   1:49 PM Other Recommendations Oral Care Recommendations Oral care BID;Staff/trained caregiver to provide oral care;Oral care before and after PO Follow Up Recommendations Skilled nursing-short term rehab (<3 hours/day) Functional Status Assessment Patient has had a recent decline in their functional status and demonstrates the ability to make significant improvements in function in a reasonable and predictable amount of time.   01/16/2022   1:49 PM Frequency and Duration  Speech Therapy Frequency (ACUTE ONLY) min 2x/week Treatment Duration 1 week     01/16/2022   1:48 PM Oral Phase Oral - Thin Teaspoon Piecemeal swallowing    01/16/2022   1:48 PM Pharyngeal Phase Pharyngeal- Nectar Cup Reduced tongue base retraction Pharyngeal- Thin Teaspoon Penetration/Aspiration during swallow;Trace aspiration;Reduced airway/laryngeal closure;Delayed swallow initiation-pyriform sinuses;Delayed swallow initiation-vallecula Pharyngeal Material enters airway, passes BELOW cords without attempt by patient to eject out (silent aspiration) Pharyngeal- Thin Cup Delayed swallow initiation-pyriform sinuses;Pharyngeal residue - valleculae;Pharyngeal residue - pyriform;Penetration/Aspiration during swallow;Reduced tongue base retraction Pharyngeal Material enters airway, passes BELOW cords without attempt by patient to eject out (silent aspiration) Pharyngeal- Puree Delayed swallow initiation-vallecula;Pharyngeal residue - valleculae    01/16/2022   1:49 PM Cervical Esophageal Phase  Cervical Esophageal Phase Impaired Cervical Esophageal Comment questionable cervical esophageal stricture Sonia Baller, MA, CCC-SLP Speech Therapy                     Overnight EEG with video  Result Date: 01/13/2022 Lora Havens, MD     01/13/2022 10:01 AM Patient Name: ANNAIS CRAFTS MRN: [id number] Epilepsy Attending: Lora Havens Referring Physician/Provider:  Greta Doom, MD Duration: 01/12/2022 0845 to 01/13/2022 0845  Patient history: 71yo F with ams. EEG to evaluate for seizure  Level of alertness: Awake  AEDs during EEG study: None  Technical aspects: This EEG study was done with scalp electrodes positioned according to the 10-20 International system of electrode placement. Electrical activity was reviewed with band pass filter of 1-'70Hz'$ , sensitivity of 7 uV/mm, display speed of 28m/sec with a '60Hz'$  notched filter applied as appropriate. EEG data were recorded continuously and digitally stored.  Video monitoring was available and reviewed as appropriate.  Description: The posterior dominant rhythm consists of 8 Hz activity of moderate voltage (25-35 uV) seen predominantly in posterior head regions, symmetric  and reactive to eye opening and eye closing. EEG showed continuous generalized 3 to 6 Hz theta-delta slowing. Hyperventilation and photic stimulation were not performed.    ABNORMALITY - Continuous slow, generalized  IMPRESSION: This study is suggestive of mild to moderate diffuse encephalopathy, nonspecific etiology. No seizures or epileptiform discharges were seen throughout the recording.  Lora Havens   EEG adult  Result Date: 01/08/2022 Lora Havens, MD     01/08/2022  2:03 PM Patient Name: Melanie Lucas MRN: [id number] Epilepsy Attending: Lora Havens Referring Physician/Provider: Lavina Hamman, MD Date: 01/08/2022 Duration: 22.39 mins Patient history: 71yo F with ams. EEG to evaluate for seizure Level of alertness: Awake AEDs during EEG study: None Technical aspects: This EEG study was done with scalp electrodes positioned according to the 10-20 International system of electrode placement. Electrical activity was reviewed with band pass filter of 1-'70Hz'$ , sensitivity of 7 uV/mm, display speed of 47m/sec with a '60Hz'$  notched filter applied as appropriate. EEG data were recorded continuously and digitally stored.  Video monitoring  was available and reviewed as appropriate. Description: The posterior dominant rhythm consists of 8 Hz activity of moderate voltage (25-35 uV) seen predominantly in posterior head regions, symmetric and reactive to eye opening and eye closing. EEG showed intermittent generalized 3 to 6 Hz theta-delta slowing. Hyperventilation and photic stimulation were not performed.   ABNORMALITY - Intermittent slow, generalized IMPRESSION: This study is suggestive of mild to moderate diffuse encephalopathy, nonspecific etiology. No seizures or epileptiform discharges were seen throughout the recording. PLora Havens  CT Head Wo Contrast  Result Date: 01/06/2022 CLINICAL DATA:  Found down EXAM: CT HEAD WITHOUT CONTRAST TECHNIQUE: Contiguous axial images were obtained from the base of the skull through the vertex without intravenous contrast. RADIATION DOSE REDUCTION: This exam was performed according to the departmental dose-optimization program which includes automated exposure control, adjustment of the mA and/or kV according to patient size and/or use of iterative reconstruction technique. COMPARISON:  01/05/2022 FINDINGS: Brain: There is no mass, hemorrhage or extra-axial collection. There is generalized atrophy without lobar predilection. Hypodensity of the white matter is most commonly associated with chronic microvascular disease. Vascular: No abnormal hyperdensity of the major intracranial arteries or dural venous sinuses. No intracranial atherosclerosis. Skull: The visualized skull base, calvarium and extracranial soft tissues are normal. Sinuses/Orbits: No fluid levels or advanced mucosal thickening of the visualized paranasal sinuses. No mastoid or middle ear effusion. The orbits are normal. IMPRESSION: 1. No acute intracranial abnormality. 2. Generalized atrophy and findings of chronic microvascular disease. Electronically Signed   By: KUlyses JarredM.D.   On: 01/06/2022 00:43   CT ANGIO HEAD NECK W WO  CM  Result Date: 01/05/2022 CLINICAL DATA:  Recent fall.  Slurred speech. EXAM: CT ANGIOGRAPHY HEAD AND NECK TECHNIQUE: Multidetector CT imaging of the head and neck was performed using the standard protocol during bolus administration of intravenous contrast. Multiplanar CT image reconstructions and MIPs were obtained to evaluate the vascular anatomy. Carotid stenosis measurements (when applicable) are obtained utilizing NASCET criteria, using the distal internal carotid diameter as the denominator. RADIATION DOSE REDUCTION: This exam was performed according to the departmental dose-optimization program which includes automated exposure control, adjustment of the mA and/or kV according to patient size and/or use of iterative reconstruction technique. CONTRAST:  771mOMNIPAQUE IOHEXOL 350 MG/ML SOLN COMPARISON:  09/12/2021 FINDINGS: CTA NECK FINDINGS SKELETON: There is no bony spinal canal stenosis. No lytic or blastic lesion. OTHER NECK: Normal pharynx,  larynx and major salivary glands. No cervical lymphadenopathy. Unremarkable thyroid gland. UPPER CHEST: No pneumothorax or pleural effusion. No nodules or masses. AORTIC ARCH: There is calcific atherosclerosis of the aortic arch. There is no aneurysm, dissection or hemodynamically significant stenosis of the visualized portion of the aorta. Conventional 3 vessel aortic branching pattern. The visualized proximal subclavian arteries are widely patent. RIGHT CAROTID SYSTEM: No dissection, occlusion or aneurysm. Mild atherosclerotic calcification at the carotid bifurcation without hemodynamically significant stenosis. LEFT CAROTID SYSTEM: No dissection, occlusion or aneurysm. Mild atherosclerotic calcification at the carotid bifurcation without hemodynamically significant stenosis. VERTEBRAL ARTERIES: Left dominant configuration. Both origins are clearly patent. There is no dissection, occlusion or flow-limiting stenosis to the skull base (V1-V3 segments). CTA HEAD  FINDINGS POSTERIOR CIRCULATION: --Vertebral arteries: Normal V4 segments. --Inferior cerebellar arteries: Normal. --Basilar artery: Normal. --Superior cerebellar arteries: Normal. --Posterior cerebral arteries (PCA): Normal. ANTERIOR CIRCULATION: --Intracranial internal carotid arteries: Atherosclerotic calcification of the internal carotid arteries at the skull base without hemodynamically significant stenosis. --Anterior cerebral arteries (ACA): Normal. Both A1 segments are present. Patent anterior communicating artery (a-comm). --Middle cerebral arteries (MCA): Normal. VENOUS SINUSES: As permitted by contrast timing, patent. ANATOMIC VARIANTS: None Review of the MIP images confirms the above findings. IMPRESSION: 1. No emergent large vessel occlusion or hemodynamically significant stenosis of the head or neck. 2. Mild bilateral carotid bifurcation atherosclerosis without hemodynamically significant stenosis. Aortic atherosclerosis (ICD10-I70.0). Electronically Signed   By: Ulyses Jarred M.D.   On: 01/05/2022 23:46   MR BRAIN WO CONTRAST  Result Date: 01/05/2022 CLINICAL DATA:  Acute neurologic deficit EXAM: MRI HEAD WITHOUT CONTRAST TECHNIQUE: Multiplanar, multiecho pulse sequences of the brain and surrounding structures were obtained without intravenous contrast. COMPARISON:  10/17/2021 FINDINGS: Brain: No acute infarct, mass effect or extra-axial collection. No acute or chronic hemorrhage. There is confluent hyperintense T2-weighted signal within the white matter. There is advanced atrophy. Multiple old small vessel infarcts of the white matter. The midline structures are normal. Vascular: Major flow voids are preserved. Skull and upper cervical spine: Normal calvarium and skull base. Visualized upper cervical spine and soft tissues are normal. Sinuses/Orbits:No paranasal sinus fluid levels or advanced mucosal thickening. No mastoid or middle ear effusion. Normal orbits. IMPRESSION: 1. No acute  intracranial abnormality. 2. Multiple old small vessel infarcts of the white matter and advanced atrophy. Electronically Signed   By: Ulyses Jarred M.D.   On: 01/05/2022 22:18   CT ABDOMEN PELVIS W CONTRAST  Result Date: 01/05/2022 CLINICAL DATA:  Abdominal pain. Fall 3 days ago. EXAM: CT ABDOMEN AND PELVIS WITH CONTRAST TECHNIQUE: Multidetector CT imaging of the abdomen and pelvis was performed using the standard protocol following bolus administration of intravenous contrast. RADIATION DOSE REDUCTION: This exam was performed according to the departmental dose-optimization program which includes automated exposure control, adjustment of the mA and/or kV according to patient size and/or use of iterative reconstruction technique. CONTRAST:  142m OMNIPAQUE IOHEXOL 300 MG/ML  SOLN COMPARISON:  CT 12/02/2021 prior exams, additional prior exams reviewed. FINDINGS: Lower chest: Heart size upper normal. No acute airspace disease or pleural effusion. Included lower ribs are intact Hepatobiliary: Small subcapsular cyst in the right lobe of the liver is again seen. No new liver abnormality. No evidence of hepatic injury. Mild hepatic steatosis. Unremarkable gallbladder. No biliary dilatation. Pancreas: Mild parenchymal atrophy. No ductal dilatation or inflammation. Spleen: Normal in size without focal abnormality. No evidence of injury. Adrenals/Urinary Tract: There are benign bilateral adrenal adenomas, stable over multiple prior exams. On the  right this measures at least 4.3 cm, the left this measures 3.6 cm. No further follow-up is needed. No evidence of adrenal hemorrhage. No hydronephrosis. No renal calculi. Stable right renal cyst, no specific imaging follow-up is needed. There is an indeterminate 12 mm lesion arising from the upper pole of the right kidney, grossly stable from prior. Mild bladder distension, mild wall thickening and perivesicular fat stranding. Stomach/Bowel: Small hiatal hernia. Stomach  otherwise unremarkable. No bowel obstruction or inflammation. Normal appendix. Moderate colonic stool burden. Occasional distal colonic diverticula without diverticulitis. Vascular/Lymphatic: Moderate aortic and branch atherosclerosis. No aortic aneurysm. No acute vascular findings. No suspicious adenopathy. Reproductive: Uterus and bilateral adnexa are unremarkable. Other: No ascites or free fluid. No free air. No abdominal wall hernia Musculoskeletal: Scoliosis with degenerative change spine. Remote fracture of the left superior and inferior pubic rami. No acute fracture of the pelvis, lumbar spine, included thoracic spine or ribs. IMPRESSION: 1. Mild bladder wall thickening and perivesicular fat stranding, suspicious for cystitis. Recommend correlation with urinalysis. 2. No acute traumatic injury to the abdomen or pelvis. 3. Stable benign bilateral adrenal adenomas. 4. Indeterminate 12 mm lesion arising from the upper right kidney, favored to represent a complex cyst. This is unchanged in size dating back to 2021 exam. Definitive characterization could be considered with renal mass protocol MRI on an elective basis. 5. Colonic diverticulosis without diverticulitis. Aortic Atherosclerosis (ICD10-I70.0). Electronically Signed   By: Keith Rake M.D.   On: 01/05/2022 21:40   DG Pelvis 1-2 Views  Result Date: 01/05/2022 CLINICAL DATA:  Fall. EXAM: PELVIS - 1-2 VIEW COMPARISON:  One-view pelvis 01/02/2022 FINDINGS: Healed left superior and inferior pubic rami fractures are present. Hips appear located on this single view. No acute fractures are present. Degenerative changes are present the lower lumbar spine. Vascular calcifications are noted. IMPRESSION: 1. No acute abnormality. 2. Healed left superior and inferior pubic rami fractures. Electronically Signed   By: San Morelle M.D.   On: 01/05/2022 20:00   DG Chest Portable 1 View  Result Date: 01/05/2022 CLINICAL DATA:  Fall. EXAM: PORTABLE  CHEST 1 VIEW COMPARISON:  One-view chest x-ray 01/02/2022 FINDINGS: The heart is enlarged. Atherosclerotic calcifications are present at the aortic arch. Mild pulmonary vascular congestion has increased since the prior exam. Acute fracture that no acute fracture present. No pneumothorax is present. Degenerative changes are present both shoulders. Mild rightward curvature is present in the upper thoracic spine. IMPRESSION: 1. Cardiomegaly and mild pulmonary vascular congestion. 2. No acute traumatic injury. 3. Aortic atherosclerosis. Electronically Signed   By: San Morelle M.D.   On: 01/05/2022 19:59   CT Cervical Spine Wo Contrast  Result Date: 01/05/2022 CLINICAL DATA:  Neck trauma (Age >= 65y) Fall 3 days ago. EXAM: CT CERVICAL SPINE WITHOUT CONTRAST TECHNIQUE: Multidetector CT imaging of the cervical spine was performed without intravenous contrast. Multiplanar CT image reconstructions were also generated. RADIATION DOSE REDUCTION: This exam was performed according to the departmental dose-optimization program which includes automated exposure control, adjustment of the mA and/or kV according to patient size and/or use of iterative reconstruction technique. COMPARISON:  Cervical spine CT 3 days ago 01/02/2022 FINDINGS: Alignment: Similar levo scoliotic curvature of the lower cervical spine. Mild straightening of normal lordosis. No traumatic subluxation or listhesis. Skull base and vertebrae: No acute fracture. C6 corpectomy with anterior fusion from C5 through C7. The dens and skull base are intact. Remote T2 superior endplate compression deformity is unchanged. Soft tissues and spinal canal: No prevertebral fluid  or swelling. No visible canal hematoma. Disc levels: Anterior fusion C5 through C7 with C6 corpectomy. Degenerative disc disease and multilevel facet hypertrophy is unchanged over the last 3 days. Posterior spurring at the level of C6-C7 causes narrowing of the spinal canal, unchanged.  There is multilevel osseous neural foraminal stenosis on the right. Upper chest: No acute or unexpected findings. Other: None. IMPRESSION: 1. No acute fracture or subluxation of the cervical spine. 2. Postsurgical and degenerative change in the cervical spine that is unchanged over the last 3 days. Electronically Signed   By: Keith Rake M.D.   On: 01/05/2022 19:51   CT Head Wo Contrast  Result Date: 01/05/2022 CLINICAL DATA:  Neuro deficit, acute, stroke suspected x3 days Fall 3 days ago. EXAM: CT HEAD WITHOUT CONTRAST TECHNIQUE: Contiguous axial images were obtained from the base of the skull through the vertex without intravenous contrast. RADIATION DOSE REDUCTION: This exam was performed according to the departmental dose-optimization program which includes automated exposure control, adjustment of the mA and/or kV according to patient size and/or use of iterative reconstruction technique. COMPARISON:  Head CT 3 days ago 01/02/2022 FINDINGS: Brain: No acute intracranial hemorrhage. No subdural or extra-axial collection. Stable degree of atrophy. Extensive periventricular and deep white matter hypodensity typical of chronic small vessel ischemia. No interval change. There is no evidence of superimposed acute infarct, although slightly limited due to the degree of background change. Small remote lacunar infarcts in the thalamus were also seen on prior. Vascular: Atherosclerosis of skullbase vasculature without hyperdense vessel or abnormal calcification. Skull: No fracture or focal lesion. Sinuses/Orbits: Paranasal sinuses are clear. Slight opacification of lower left mastoid air cells, unchanged. The visualized orbits are unremarkable. Mild soft tissue edema in the right face. Other: None. IMPRESSION: 1. No acute intracranial abnormality. 2. Stable atrophy and chronic small vessel ischemia. If there is persistent clinical concern for ischemia, recommend brain MRI, as the degree of background chronic  change could obscure subtle findings. Electronically Signed   By: Keith Rake M.D.   On: 01/05/2022 19:46   CT HEAD WO CONTRAST (5MM)  Result Date: 01/02/2022 CLINICAL DATA:  Fall with head trauma.  Anti coagulation. EXAM: CT HEAD WITHOUT CONTRAST CT CERVICAL SPINE WITHOUT CONTRAST TECHNIQUE: Multidetector CT imaging of the head and cervical spine was performed following the standard protocol without intravenous contrast. Multiplanar CT image reconstructions of the cervical spine were also generated. RADIATION DOSE REDUCTION: This exam was performed according to the departmental dose-optimization program which includes automated exposure control, adjustment of the mA and/or kV according to patient size and/or use of iterative reconstruction technique. COMPARISON:  Multiple exams, including 12/02/2021 and 09/12/2021 FINDINGS: CT HEAD FINDINGS Brain: Periventricular white matter and corona radiata hypodensities favor chronic ischemic microvascular white matter disease. Small remote thalamic lacunar infarcts unchanged from previous. Vascular: There is atherosclerotic calcification of the cavernous carotid arteries bilaterally. Skull: Unremarkable Sinuses/Orbits: Small left mastoid effusion. Other: No supplemental non-categorized findings. CT CERVICAL SPINE FINDINGS Alignment: Mild levoconvex curvature of the cervicothoracic junction. No subluxation. Skull base and vertebrae: No fracture. C6 corpectomy with anterior cervical plate and screw fixators at C5 and C7. Remote superior endplate compression at T2. 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 Hemangioma in the C4 vertebra. Substantial degenerative facet arthropathy eccentric to the right at C3-4. Partially fused right C5-6 facet joint. No acute fracture identified. Soft tissues and spinal canal: Unremarkable Disc levels: Osseous foraminal narrowing on the right at C4-5, C5-6, C6-7, and T1-2 due to intervertebral and facet spurring.  The C6-7 corpectomy level, especially  along the posterior margin of C6, there a 5 mm posterior bony retropulsion or spur on image 50 of series 8 contributing to mild central narrowing of the thecal sac at this level. Upper chest: Aortic arch and branch vessel atherosclerotic calcification. Other: No supplemental non-categorized findings. IMPRESSION: 1. No acute intracranial findings or acute cervical spine findings. 2. Periventricular white matter and corona radiata hypodensities favor chronic ischemic microvascular white matter disease. 3. Small remote thalamic lacunar infarcts. 4. Small left mastoid effusion. 5. Prior C6 corpectomy with anterior cervical plate and screw fixators at C5 and C7. There is a 5 mm posterior bony retropulsion or spur along the posterior margin of C6 contributing to mild central narrowing of the thecal sac at this level. 6. Osseous foraminal narrowing on the right at C4-5, C5-6, C6-7, and T1-2 due to intervertebral and facet spurring. 7. 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 8. Remote superior endplate compression at T2. 9. Aortic atherosclerosis. Aortic Atherosclerosis (ICD10-I70.0). Electronically Signed   By: Van Clines M.D.   On: 01/02/2022 15:58   CT Cervical Spine Wo Contrast  Result Date: 01/02/2022 CLINICAL DATA:  Fall with head trauma.  Anti coagulation. EXAM: CT HEAD WITHOUT CONTRAST CT CERVICAL SPINE WITHOUT CONTRAST TECHNIQUE: Multidetector CT imaging of the head and cervical spine was performed following the standard protocol without intravenous contrast. Multiplanar CT image reconstructions of the cervical spine were also generated. RADIATION DOSE REDUCTION: This exam was performed according to the departmental dose-optimization program which includes automated exposure control, adjustment of the mA and/or kV according to patient size and/or use of iterative reconstruction technique. COMPARISON:  Multiple exams, including 12/02/2021 and 09/12/2021 FINDINGS: CT HEAD FINDINGS Brain: Periventricular white matter  and corona radiata hypodensities favor chronic ischemic microvascular white matter disease. Small remote thalamic lacunar infarcts unchanged from previous. Vascular: There is atherosclerotic calcification of the cavernous carotid arteries bilaterally. Skull: Unremarkable Sinuses/Orbits: Small left mastoid effusion. Other: No supplemental non-categorized findings. CT CERVICAL SPINE FINDINGS Alignment: Mild levoconvex curvature of the cervicothoracic junction. No subluxation. Skull base and vertebrae: No fracture. C6 corpectomy with anterior cervical plate and screw fixators at C5 and C7. Remote superior endplate compression at T2. 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 Hemangioma in the C4 vertebra. Substantial degenerative facet arthropathy eccentric to the right at C3-4. Partially fused right C5-6 facet joint. No acute fracture identified. Soft tissues and spinal canal: Unremarkable Disc levels: Osseous foraminal narrowing on the right at C4-5, C5-6, C6-7, and T1-2 due to intervertebral and facet spurring. The C6-7 corpectomy level, especially along the posterior margin of C6, there a 5 mm posterior bony retropulsion or spur on image 50 of series 8 contributing to mild central narrowing of the thecal sac at this level. Upper chest: Aortic arch and branch vessel atherosclerotic calcification. Other: No supplemental non-categorized findings. IMPRESSION: 1. No acute intracranial findings or acute cervical spine findings. 2. Periventricular white matter and corona radiata hypodensities favor chronic ischemic microvascular white matter disease. 3. Small remote thalamic lacunar infarcts. 4. Small left mastoid effusion. 5. Prior C6 corpectomy with anterior cervical plate and screw fixators at C5 and C7. There is a 5 mm posterior bony retropulsion or spur along the posterior margin of C6 contributing to mild central narrowing of the thecal sac at this level. 6. Osseous foraminal narrowing on the right at C4-5, C5-6, C6-7, and T1-2 due  to intervertebral and facet spurring. 7. 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 8. Remote superior endplate compression at T2. 9. Aortic atherosclerosis. Aortic  Atherosclerosis (ICD10-I70.0). Electronically Signed   By: Van Clines M.D.   On: 01/02/2022 15:58   DG Pelvis Portable  Result Date: 01/02/2022 CLINICAL DATA:  Fall, anticoagulation EXAM: PORTABLE PELVIS 1-2 VIEWS COMPARISON:  CT pelvis 12/02/2021 FINDINGS: Moderate degenerative chondral thinning in both hips. Mild sclerosis in the left pubic body, left inferior pubic ramus, and in the sacral ala compatible with healing sacral insufficiency fracture and healing fractures of the left pubic rami. IMPRESSION: 1. Healing nondisplaced fractures of the sacrum, left inferior pubic ramus, and left pubic body. 2. Moderate degenerative chondral thinning in both hips. Electronically Signed   By: Van Clines M.D.   On: 01/02/2022 15:28   DG Chest Port 1 View  Result Date: 01/02/2022 CLINICAL DATA:  Fall.  Anti coagulation. EXAM: PORTABLE CHEST 1 VIEW COMPARISON:  12/02/2021 FINDINGS: Lower cervical plate and screw fixator. Atherosclerotic calcification of the aortic arch. Heart size within normal limits for AP projection. The lungs appear clear.  Bony demineralization noted. Dextroconvex upper thoracic scoliosis. IMPRESSION: 1. No active cardiopulmonary disease is radiographically apparent. 2. Dextroconvex upper thoracic scoliosis. 3. Bony demineralization. Electronically Signed   By: Van Clines M.D.   On: 01/02/2022 15:24     Subjective: No acute issues or events overnight   Discharge Exam: Vitals:   01/17/22 0754 01/17/22 1155  BP: 132/74 123/60  Pulse: 65 61  Resp: 15 16  Temp: 98.4 F (36.9 C) 98.8 F (37.1 C)  SpO2: 100% 97%   Vitals:   01/17/22 0408 01/17/22 0412 01/17/22 0754 01/17/22 1155  BP:  135/73 132/74 123/60  Pulse:  63 65 61  Resp:  '16 15 16  '$ Temp:  97.8 F (36.6 C) 98.4 F (36.9 C) 98.8 F (37.1 C)   TempSrc:  Oral Axillary Axillary  SpO2:  96% 100% 97%  Weight: 60.7 kg     Height:        General: Pt is alert, awake, not in acute distress.  Somewhat difficult to comprehend speech but patient able to do simple commands and was able to request breakfast at bedside. Cardiovascular: RRR, S1/S2 +, no rubs, no gallops Respiratory: CTA bilaterally, no wheezing, no rhonchi Abdominal: Soft, NT, ND, bowel sounds + Extremities: no edema, no cyanosis    The results of significant diagnostics from this hospitalization (including imaging, microbiology, ancillary and laboratory) are listed below for reference.     Microbiology: No results found for this or any previous visit (from the past 240 hour(s)).   Labs: BNP (last 3 results) Recent Labs    01/27/21 1408 03/07/21 1146 12/02/21 1643  BNP 189.4* 220.2* 12.8   Basic Metabolic Panel: Recent Labs  Lab 01/11/22 0543 01/13/22 0341 01/14/22 0347 01/15/22 0350 01/16/22 0452 01/17/22 0703  NA 140 144 141 135 136 133*  K 4.1 4.4 3.9 4.4 3.8 4.1  CL 109 107 105 101 103 102  CO2 '22 25 27 24 24 24  '$ GLUCOSE 153* 204* 181* 216* 150* 195*  BUN 27* 29* 27* 30* 21 20  CREATININE 0.68 0.54 0.60 0.58 0.54 0.53  CALCIUM 8.7* 9.6 9.0 8.9 8.1* 8.4*  MG 2.4 2.2 2.2 2.0 2.0 2.0  PHOS 3.4  --   --   --   --   --    Liver Function Tests: Recent Labs  Lab 01/11/22 0543  ALBUMIN 3.4*   No results for input(s): "LIPASE", "AMYLASE" in the last 168 hours. No results for input(s): "AMMONIA" in the last 168 hours. CBC: Recent  Labs  Lab 01/11/22 0543 01/13/22 0341 01/17/22 0703  WBC 6.5 6.7 6.1  HGB 15.8* 16.1* 14.7  HCT 47.7* 46.5* 42.4  MCV 92.8 92.6 91.4  PLT 300 289 267   Cardiac Enzymes: No results for input(s): "CKTOTAL", "CKMB", "CKMBINDEX", "TROPONINI" in the last 168 hours. BNP: Invalid input(s): "POCBNP" CBG: Recent Labs  Lab 01/16/22 1134 01/16/22 1632 01/16/22 2140 01/17/22 0614 01/17/22 1150  GLUCAP 178* 307*  311* 204* 167*   D-Dimer No results for input(s): "DDIMER" in the last 72 hours. Hgb A1c No results for input(s): "HGBA1C" in the last 72 hours. Lipid Profile No results for input(s): "CHOL", "HDL", "LDLCALC", "TRIG", "CHOLHDL", "LDLDIRECT" in the last 72 hours. Thyroid function studies No results for input(s): "TSH", "T4TOTAL", "T3FREE", "THYROIDAB" in the last 72 hours.  Invalid input(s): "FREET3" Anemia work up No results for input(s): "VITAMINB12", "FOLATE", "FERRITIN", "TIBC", "IRON", "RETICCTPCT" in the last 72 hours. Urinalysis    Component Value Date/Time   COLORURINE YELLOW 01/05/2022 2036   APPEARANCEUR HAZY (A) 01/05/2022 2036   LABSPEC 1.033 (H) 01/05/2022 2036   PHURINE 5.0 01/05/2022 2036   GLUCOSEU >=500 (A) 01/05/2022 2036   HGBUR NEGATIVE 01/05/2022 2036   BILIRUBINUR NEGATIVE 01/05/2022 2036   BILIRUBINUR negative 10/17/2015 1839   KETONESUR 20 (A) 01/05/2022 2036   PROTEINUR NEGATIVE 01/05/2022 2036   UROBILINOGEN 0.2 10/17/2015 1839   UROBILINOGEN 1.0 09/20/2010 0025   NITRITE NEGATIVE 01/05/2022 2036   LEUKOCYTESUR MODERATE (A) 01/05/2022 2036   Sepsis Labs Recent Labs  Lab 01/11/22 0543 01/13/22 0341 01/17/22 0703  WBC 6.5 6.7 6.1   Microbiology No results found for this or any previous visit (from the past 240 hour(s)).   Time coordinating discharge: Over 30 minutes  SIGNED:   Little Ishikawa, DO [facility] 01/17/2022, 12:39 PM Pager   If 7PM-7AM, please contact night-coverage [email or web address]

## 2022-01-17 NOTE — TOC Transition Note (Signed)
Transition of Care [facility]) - CM/SW Discharge Note   Patient Details  Name: Melanie Lucas MRN: [id number] Date of Birth: [birth year]  [facility] of [facility]) CM/SW Contact:  Coralee Pesa, [place] Phone Number: 01/17/2022, 1:23 PM   Clinical Narrative:    Pt to be transported to [facility] via [place]. Nurse to call report to [phone] Rm# [id number]   Final next level of care: Skilled Nursing Facility Barriers to Discharge: Barriers Resolved   Patient Goals and CMS Choice        Discharge Placement              Patient chooses bed at:  [facility]) Patient to be transferred to facility by: [place] Name of family member notified: [name] Patient and family notified of of transfer: 01/17/22  Discharge Plan and Services                                     Social Determinants of Health (SDOH) Interventions Food Insecurity Interventions: Intervention Not Indicated Housing Interventions: Intervention Not Indicated Transportation Interventions: Intervention Not Indicated Utilities Interventions: Intervention Not Indicated   Readmission Risk Interventions    01/10/2022    3:00 PM 08/01/2021   10:59 AM  Readmission Risk Prevention Plan  Transportation Screening Complete Complete  PCP or Specialist Appt within 5-7 Days  Complete  PCP or Specialist Appt within 3-5 Days Complete   Home Care Screening  Complete  Medication Review (RN CM)  Complete  HRI or Home Care Consult Complete   Social Work Consult for [place] Planning/Counseling Complete   Palliative Care Screening Not Applicable   Medication Review Press photographer) Complete

## 2022-01-17 NOTE — Progress Notes (Addendum)
Central telemetry called patient had a burst atrial tachycardia for about 8 seconds around 0453, HR was 133. Currently, pt HR is 61 normal sinus rhythm. On call provider made aware, with new orders.

## 2022-01-22 DIAGNOSIS — D6869 Other thrombophilia: Secondary | ICD-10-CM | POA: Diagnosis not present

## 2022-01-22 DIAGNOSIS — I1310 Hypertensive heart and chronic kidney disease without heart failure, with stage 1 through stage 4 chronic kidney disease, or unspecified chronic kidney disease: Secondary | ICD-10-CM | POA: Diagnosis not present

## 2022-01-22 DIAGNOSIS — G4700 Insomnia, unspecified: Secondary | ICD-10-CM | POA: Diagnosis not present

## 2022-01-22 DIAGNOSIS — I480 Paroxysmal atrial fibrillation: Secondary | ICD-10-CM | POA: Diagnosis not present

## 2022-01-24 DIAGNOSIS — I480 Paroxysmal atrial fibrillation: Secondary | ICD-10-CM | POA: Diagnosis not present

## 2022-01-24 DIAGNOSIS — R4701 Aphasia: Secondary | ICD-10-CM | POA: Diagnosis not present

## 2022-01-24 DIAGNOSIS — R296 Repeated falls: Secondary | ICD-10-CM | POA: Diagnosis not present

## 2022-01-24 DIAGNOSIS — I5032 Chronic diastolic (congestive) heart failure: Secondary | ICD-10-CM | POA: Diagnosis not present

## 2022-01-24 DIAGNOSIS — E1169 Type 2 diabetes mellitus with other specified complication: Secondary | ICD-10-CM | POA: Diagnosis not present

## 2022-01-24 DIAGNOSIS — Z8679 Personal history of other diseases of the circulatory system: Secondary | ICD-10-CM | POA: Diagnosis not present

## 2022-01-24 DIAGNOSIS — I70201 Unspecified atherosclerosis of native arteries of extremities, right leg: Secondary | ICD-10-CM | POA: Diagnosis not present

## 2022-01-24 DIAGNOSIS — G9340 Encephalopathy, unspecified: Secondary | ICD-10-CM | POA: Diagnosis not present

## 2022-01-24 DIAGNOSIS — E1151 Type 2 diabetes mellitus with diabetic peripheral angiopathy without gangrene: Secondary | ICD-10-CM | POA: Diagnosis not present

## 2022-01-25 DIAGNOSIS — I1310 Hypertensive heart and chronic kidney disease without heart failure, with stage 1 through stage 4 chronic kidney disease, or unspecified chronic kidney disease: Secondary | ICD-10-CM | POA: Diagnosis not present

## 2022-01-25 DIAGNOSIS — E860 Dehydration: Secondary | ICD-10-CM | POA: Diagnosis not present

## 2022-01-25 DIAGNOSIS — E440 Moderate protein-calorie malnutrition: Secondary | ICD-10-CM | POA: Diagnosis not present

## 2022-01-30 DIAGNOSIS — Z79899 Other long term (current) drug therapy: Secondary | ICD-10-CM | POA: Diagnosis not present

## 2022-01-30 DIAGNOSIS — Z9189 Other specified personal risk factors, not elsewhere classified: Secondary | ICD-10-CM | POA: Diagnosis not present

## 2022-01-30 DIAGNOSIS — N390 Urinary tract infection, site not specified: Secondary | ICD-10-CM | POA: Diagnosis not present

## 2022-01-30 DIAGNOSIS — F32A Depression, unspecified: Secondary | ICD-10-CM | POA: Diagnosis not present

## 2022-01-30 DIAGNOSIS — F5101 Primary insomnia: Secondary | ICD-10-CM | POA: Diagnosis not present

## 2022-02-02 DIAGNOSIS — I480 Paroxysmal atrial fibrillation: Secondary | ICD-10-CM | POA: Diagnosis not present

## 2022-02-02 DIAGNOSIS — I639 Cerebral infarction, unspecified: Secondary | ICD-10-CM | POA: Diagnosis not present

## 2022-02-02 DIAGNOSIS — E1165 Type 2 diabetes mellitus with hyperglycemia: Secondary | ICD-10-CM | POA: Diagnosis not present

## 2022-02-02 DIAGNOSIS — E785 Hyperlipidemia, unspecified: Secondary | ICD-10-CM | POA: Diagnosis not present

## 2022-02-02 DIAGNOSIS — R4701 Aphasia: Secondary | ICD-10-CM | POA: Diagnosis not present

## 2022-02-02 DIAGNOSIS — E1169 Type 2 diabetes mellitus with other specified complication: Secondary | ICD-10-CM | POA: Diagnosis not present

## 2022-02-02 DIAGNOSIS — N390 Urinary tract infection, site not specified: Secondary | ICD-10-CM | POA: Diagnosis not present

## 2022-02-02 DIAGNOSIS — R52 Pain, unspecified: Secondary | ICD-10-CM | POA: Diagnosis not present

## 2022-02-02 DIAGNOSIS — R296 Repeated falls: Secondary | ICD-10-CM | POA: Diagnosis not present

## 2022-02-05 DIAGNOSIS — N390 Urinary tract infection, site not specified: Secondary | ICD-10-CM | POA: Diagnosis not present

## 2022-02-05 DIAGNOSIS — I480 Paroxysmal atrial fibrillation: Secondary | ICD-10-CM | POA: Diagnosis not present

## 2022-02-05 DIAGNOSIS — R4701 Aphasia: Secondary | ICD-10-CM | POA: Diagnosis not present

## 2022-02-05 DIAGNOSIS — E1165 Type 2 diabetes mellitus with hyperglycemia: Secondary | ICD-10-CM | POA: Diagnosis not present

## 2022-02-08 DIAGNOSIS — R4701 Aphasia: Secondary | ICD-10-CM | POA: Diagnosis not present

## 2022-02-08 DIAGNOSIS — I639 Cerebral infarction, unspecified: Secondary | ICD-10-CM | POA: Diagnosis not present

## 2022-02-08 DIAGNOSIS — E785 Hyperlipidemia, unspecified: Secondary | ICD-10-CM | POA: Diagnosis not present

## 2022-02-08 DIAGNOSIS — E1165 Type 2 diabetes mellitus with hyperglycemia: Secondary | ICD-10-CM | POA: Diagnosis not present

## 2022-02-08 DIAGNOSIS — N390 Urinary tract infection, site not specified: Secondary | ICD-10-CM | POA: Diagnosis not present

## 2022-02-08 DIAGNOSIS — I480 Paroxysmal atrial fibrillation: Secondary | ICD-10-CM | POA: Diagnosis not present

## 2022-02-08 DIAGNOSIS — R296 Repeated falls: Secondary | ICD-10-CM | POA: Diagnosis not present

## 2022-02-08 DIAGNOSIS — E1169 Type 2 diabetes mellitus with other specified complication: Secondary | ICD-10-CM | POA: Diagnosis not present

## 2022-02-08 DIAGNOSIS — R52 Pain, unspecified: Secondary | ICD-10-CM | POA: Diagnosis not present

## 2022-02-20 DIAGNOSIS — R600 Localized edema: Secondary | ICD-10-CM | POA: Diagnosis not present

## 2022-02-20 DIAGNOSIS — N390 Urinary tract infection, site not specified: Secondary | ICD-10-CM | POA: Diagnosis not present

## 2022-02-20 DIAGNOSIS — Z79899 Other long term (current) drug therapy: Secondary | ICD-10-CM | POA: Diagnosis not present

## 2022-02-21 DIAGNOSIS — I639 Cerebral infarction, unspecified: Secondary | ICD-10-CM | POA: Diagnosis not present

## 2022-02-21 DIAGNOSIS — F0781 Postconcussional syndrome: Secondary | ICD-10-CM | POA: Diagnosis not present

## 2022-02-21 DIAGNOSIS — G9340 Encephalopathy, unspecified: Secondary | ICD-10-CM | POA: Diagnosis not present

## 2022-02-21 DIAGNOSIS — R4701 Aphasia: Secondary | ICD-10-CM | POA: Diagnosis not present

## 2022-02-21 DIAGNOSIS — I110 Hypertensive heart disease with heart failure: Secondary | ICD-10-CM | POA: Diagnosis not present

## 2022-02-21 DIAGNOSIS — E1165 Type 2 diabetes mellitus with hyperglycemia: Secondary | ICD-10-CM | POA: Diagnosis not present

## 2022-02-21 DIAGNOSIS — I5032 Chronic diastolic (congestive) heart failure: Secondary | ICD-10-CM | POA: Diagnosis not present

## 2022-02-22 DIAGNOSIS — E559 Vitamin D deficiency, unspecified: Secondary | ICD-10-CM | POA: Diagnosis not present

## 2022-02-22 DIAGNOSIS — N390 Urinary tract infection, site not specified: Secondary | ICD-10-CM | POA: Diagnosis not present

## 2022-02-22 DIAGNOSIS — I10 Essential (primary) hypertension: Secondary | ICD-10-CM | POA: Diagnosis not present

## 2022-02-26 DIAGNOSIS — M6281 Muscle weakness (generalized): Secondary | ICD-10-CM | POA: Diagnosis not present

## 2022-03-06 DIAGNOSIS — I69220 Aphasia following other nontraumatic intracranial hemorrhage: Secondary | ICD-10-CM | POA: Diagnosis not present

## 2022-03-06 DIAGNOSIS — I69292 Facial weakness following other nontraumatic intracranial hemorrhage: Secondary | ICD-10-CM | POA: Diagnosis not present

## 2022-03-06 DIAGNOSIS — I6921 Attention and concentration deficit following other nontraumatic intracranial hemorrhage: Secondary | ICD-10-CM | POA: Diagnosis not present

## 2022-03-09 DIAGNOSIS — R4701 Aphasia: Secondary | ICD-10-CM | POA: Diagnosis not present

## 2022-03-09 DIAGNOSIS — F0781 Postconcussional syndrome: Secondary | ICD-10-CM | POA: Diagnosis not present

## 2022-03-09 DIAGNOSIS — E1165 Type 2 diabetes mellitus with hyperglycemia: Secondary | ICD-10-CM | POA: Diagnosis not present

## 2022-03-09 DIAGNOSIS — I5032 Chronic diastolic (congestive) heart failure: Secondary | ICD-10-CM | POA: Diagnosis not present

## 2022-03-09 DIAGNOSIS — G9340 Encephalopathy, unspecified: Secondary | ICD-10-CM | POA: Diagnosis not present

## 2022-03-09 DIAGNOSIS — I110 Hypertensive heart disease with heart failure: Secondary | ICD-10-CM | POA: Diagnosis not present

## 2022-03-09 DIAGNOSIS — I639 Cerebral infarction, unspecified: Secondary | ICD-10-CM | POA: Diagnosis not present

## 2022-03-10 DIAGNOSIS — F0781 Postconcussional syndrome: Secondary | ICD-10-CM | POA: Diagnosis not present

## 2022-03-10 DIAGNOSIS — I2510 Atherosclerotic heart disease of native coronary artery without angina pectoris: Secondary | ICD-10-CM | POA: Diagnosis not present

## 2022-03-10 DIAGNOSIS — H8101 Meniere's disease, right ear: Secondary | ICD-10-CM | POA: Diagnosis not present

## 2022-03-10 DIAGNOSIS — I69292 Facial weakness following other nontraumatic intracranial hemorrhage: Secondary | ICD-10-CM | POA: Diagnosis not present

## 2022-03-10 DIAGNOSIS — I69220 Aphasia following other nontraumatic intracranial hemorrhage: Secondary | ICD-10-CM | POA: Diagnosis not present

## 2022-03-10 DIAGNOSIS — I5032 Chronic diastolic (congestive) heart failure: Secondary | ICD-10-CM | POA: Diagnosis not present

## 2022-03-10 DIAGNOSIS — J449 Chronic obstructive pulmonary disease, unspecified: Secondary | ICD-10-CM | POA: Diagnosis not present

## 2022-03-10 DIAGNOSIS — I6921 Attention and concentration deficit following other nontraumatic intracranial hemorrhage: Secondary | ICD-10-CM | POA: Diagnosis not present

## 2022-03-11 DIAGNOSIS — Z882 Allergy status to sulfonamides status: Secondary | ICD-10-CM | POA: Diagnosis not present

## 2022-03-11 DIAGNOSIS — J069 Acute upper respiratory infection, unspecified: Secondary | ICD-10-CM | POA: Diagnosis not present

## 2022-03-11 DIAGNOSIS — Z888 Allergy status to other drugs, medicaments and biological substances status: Secondary | ICD-10-CM | POA: Diagnosis not present

## 2022-03-11 DIAGNOSIS — Z7901 Long term (current) use of anticoagulants: Secondary | ICD-10-CM | POA: Diagnosis not present

## 2022-03-11 DIAGNOSIS — F17210 Nicotine dependence, cigarettes, uncomplicated: Secondary | ICD-10-CM | POA: Diagnosis not present

## 2022-03-11 DIAGNOSIS — J449 Chronic obstructive pulmonary disease, unspecified: Secondary | ICD-10-CM | POA: Diagnosis not present

## 2022-03-11 DIAGNOSIS — Z885 Allergy status to narcotic agent status: Secondary | ICD-10-CM | POA: Diagnosis not present

## 2022-03-11 DIAGNOSIS — Z1152 Encounter for screening for COVID-19: Secondary | ICD-10-CM | POA: Diagnosis not present

## 2022-03-11 DIAGNOSIS — Z794 Long term (current) use of insulin: Secondary | ICD-10-CM | POA: Diagnosis not present

## 2022-03-14 DIAGNOSIS — I6921 Attention and concentration deficit following other nontraumatic intracranial hemorrhage: Secondary | ICD-10-CM | POA: Diagnosis not present

## 2022-03-14 DIAGNOSIS — I69220 Aphasia following other nontraumatic intracranial hemorrhage: Secondary | ICD-10-CM | POA: Diagnosis not present

## 2022-03-14 DIAGNOSIS — G9340 Encephalopathy, unspecified: Secondary | ICD-10-CM | POA: Diagnosis not present

## 2022-03-14 DIAGNOSIS — E1140 Type 2 diabetes mellitus with diabetic neuropathy, unspecified: Secondary | ICD-10-CM | POA: Diagnosis not present

## 2022-03-14 DIAGNOSIS — E440 Moderate protein-calorie malnutrition: Secondary | ICD-10-CM | POA: Diagnosis not present

## 2022-03-14 DIAGNOSIS — J449 Chronic obstructive pulmonary disease, unspecified: Secondary | ICD-10-CM | POA: Diagnosis not present

## 2022-03-14 DIAGNOSIS — I69222 Dysarthria following other nontraumatic intracranial hemorrhage: Secondary | ICD-10-CM | POA: Diagnosis not present

## 2022-03-14 DIAGNOSIS — I110 Hypertensive heart disease with heart failure: Secondary | ICD-10-CM | POA: Diagnosis not present

## 2022-03-14 DIAGNOSIS — I5032 Chronic diastolic (congestive) heart failure: Secondary | ICD-10-CM | POA: Diagnosis not present

## 2022-03-16 DIAGNOSIS — G9340 Encephalopathy, unspecified: Secondary | ICD-10-CM | POA: Diagnosis not present

## 2022-03-16 DIAGNOSIS — I69220 Aphasia following other nontraumatic intracranial hemorrhage: Secondary | ICD-10-CM | POA: Diagnosis not present

## 2022-03-16 DIAGNOSIS — E1140 Type 2 diabetes mellitus with diabetic neuropathy, unspecified: Secondary | ICD-10-CM | POA: Diagnosis not present

## 2022-03-16 DIAGNOSIS — I6921 Attention and concentration deficit following other nontraumatic intracranial hemorrhage: Secondary | ICD-10-CM | POA: Diagnosis not present

## 2022-03-16 DIAGNOSIS — I5032 Chronic diastolic (congestive) heart failure: Secondary | ICD-10-CM | POA: Diagnosis not present

## 2022-03-16 DIAGNOSIS — E440 Moderate protein-calorie malnutrition: Secondary | ICD-10-CM | POA: Diagnosis not present

## 2022-03-16 DIAGNOSIS — I69222 Dysarthria following other nontraumatic intracranial hemorrhage: Secondary | ICD-10-CM | POA: Diagnosis not present

## 2022-03-16 DIAGNOSIS — I110 Hypertensive heart disease with heart failure: Secondary | ICD-10-CM | POA: Diagnosis not present

## 2022-03-16 DIAGNOSIS — J449 Chronic obstructive pulmonary disease, unspecified: Secondary | ICD-10-CM | POA: Diagnosis not present

## 2022-03-19 ENCOUNTER — Telehealth: Payer: Self-pay | Admitting: *Deleted

## 2022-03-19 DIAGNOSIS — R9431 Abnormal electrocardiogram [ECG] [EKG]: Secondary | ICD-10-CM | POA: Diagnosis not present

## 2022-03-19 DIAGNOSIS — E440 Moderate protein-calorie malnutrition: Secondary | ICD-10-CM | POA: Diagnosis not present

## 2022-03-19 DIAGNOSIS — R296 Repeated falls: Secondary | ICD-10-CM | POA: Diagnosis not present

## 2022-03-19 DIAGNOSIS — R001 Bradycardia, unspecified: Secondary | ICD-10-CM | POA: Diagnosis not present

## 2022-03-19 DIAGNOSIS — R4182 Altered mental status, unspecified: Secondary | ICD-10-CM | POA: Diagnosis not present

## 2022-03-19 DIAGNOSIS — E860 Dehydration: Secondary | ICD-10-CM | POA: Diagnosis not present

## 2022-03-19 DIAGNOSIS — E1142 Type 2 diabetes mellitus with diabetic polyneuropathy: Secondary | ICD-10-CM | POA: Diagnosis not present

## 2022-03-19 DIAGNOSIS — Z8673 Personal history of transient ischemic attack (TIA), and cerebral infarction without residual deficits: Secondary | ICD-10-CM | POA: Diagnosis not present

## 2022-03-19 DIAGNOSIS — R627 Adult failure to thrive: Secondary | ICD-10-CM | POA: Diagnosis not present

## 2022-03-19 NOTE — Progress Notes (Signed)
  Care Coordination   Note   03/19/2022 Name: [name] MRN: [id number] DOB: [birth year]  Melanie Lucas is a 72 y.o. year old female who sees Sue Lush, [place] for primary care. I reached out to [name] by phone today to offer care coordination services.  [name] was given information about Care Coordination services today including:   The Care Coordination services include support from the care team which includes your Nurse Coordinator, Clinical Social Worker, or Pharmacist.  The Care Coordination team is here to help remove barriers to the health concerns and goals most important to you. Care Coordination services are voluntary, and the patient may decline or stop services at any time by request to their care team member.   Care Coordination Consent Status: Patient did not agree to participate in care coordination services at this time.   Encounter Outcome:  Pt. Refused  [place]  Direct Dial: [phone]

## 2022-03-20 DIAGNOSIS — E1140 Type 2 diabetes mellitus with diabetic neuropathy, unspecified: Secondary | ICD-10-CM | POA: Diagnosis not present

## 2022-03-20 DIAGNOSIS — G9340 Encephalopathy, unspecified: Secondary | ICD-10-CM | POA: Diagnosis not present

## 2022-03-20 DIAGNOSIS — I69220 Aphasia following other nontraumatic intracranial hemorrhage: Secondary | ICD-10-CM | POA: Diagnosis not present

## 2022-03-20 DIAGNOSIS — I69222 Dysarthria following other nontraumatic intracranial hemorrhage: Secondary | ICD-10-CM | POA: Diagnosis not present

## 2022-03-20 DIAGNOSIS — J449 Chronic obstructive pulmonary disease, unspecified: Secondary | ICD-10-CM | POA: Diagnosis not present

## 2022-03-20 DIAGNOSIS — I110 Hypertensive heart disease with heart failure: Secondary | ICD-10-CM | POA: Diagnosis not present

## 2022-03-20 DIAGNOSIS — I5032 Chronic diastolic (congestive) heart failure: Secondary | ICD-10-CM | POA: Diagnosis not present

## 2022-03-20 DIAGNOSIS — E440 Moderate protein-calorie malnutrition: Secondary | ICD-10-CM | POA: Diagnosis not present

## 2022-03-20 DIAGNOSIS — I6921 Attention and concentration deficit following other nontraumatic intracranial hemorrhage: Secondary | ICD-10-CM | POA: Diagnosis not present

## 2022-03-21 DIAGNOSIS — I6921 Attention and concentration deficit following other nontraumatic intracranial hemorrhage: Secondary | ICD-10-CM | POA: Diagnosis not present

## 2022-03-21 DIAGNOSIS — I110 Hypertensive heart disease with heart failure: Secondary | ICD-10-CM | POA: Diagnosis not present

## 2022-03-21 DIAGNOSIS — I5032 Chronic diastolic (congestive) heart failure: Secondary | ICD-10-CM | POA: Diagnosis not present

## 2022-03-21 DIAGNOSIS — E440 Moderate protein-calorie malnutrition: Secondary | ICD-10-CM | POA: Diagnosis not present

## 2022-03-21 DIAGNOSIS — J449 Chronic obstructive pulmonary disease, unspecified: Secondary | ICD-10-CM | POA: Diagnosis not present

## 2022-03-21 DIAGNOSIS — G9340 Encephalopathy, unspecified: Secondary | ICD-10-CM | POA: Diagnosis not present

## 2022-03-21 DIAGNOSIS — I69220 Aphasia following other nontraumatic intracranial hemorrhage: Secondary | ICD-10-CM | POA: Diagnosis not present

## 2022-03-21 DIAGNOSIS — E1140 Type 2 diabetes mellitus with diabetic neuropathy, unspecified: Secondary | ICD-10-CM | POA: Diagnosis not present

## 2022-03-21 DIAGNOSIS — I69222 Dysarthria following other nontraumatic intracranial hemorrhage: Secondary | ICD-10-CM | POA: Diagnosis not present

## 2022-03-22 DIAGNOSIS — I69222 Dysarthria following other nontraumatic intracranial hemorrhage: Secondary | ICD-10-CM | POA: Diagnosis not present

## 2022-03-22 DIAGNOSIS — E440 Moderate protein-calorie malnutrition: Secondary | ICD-10-CM | POA: Diagnosis not present

## 2022-03-22 DIAGNOSIS — E1140 Type 2 diabetes mellitus with diabetic neuropathy, unspecified: Secondary | ICD-10-CM | POA: Diagnosis not present

## 2022-03-22 DIAGNOSIS — G9340 Encephalopathy, unspecified: Secondary | ICD-10-CM | POA: Diagnosis not present

## 2022-03-22 DIAGNOSIS — I110 Hypertensive heart disease with heart failure: Secondary | ICD-10-CM | POA: Diagnosis not present

## 2022-03-22 DIAGNOSIS — I69220 Aphasia following other nontraumatic intracranial hemorrhage: Secondary | ICD-10-CM | POA: Diagnosis not present

## 2022-03-22 DIAGNOSIS — I6921 Attention and concentration deficit following other nontraumatic intracranial hemorrhage: Secondary | ICD-10-CM | POA: Diagnosis not present

## 2022-03-22 DIAGNOSIS — I5032 Chronic diastolic (congestive) heart failure: Secondary | ICD-10-CM | POA: Diagnosis not present

## 2022-03-22 DIAGNOSIS — J449 Chronic obstructive pulmonary disease, unspecified: Secondary | ICD-10-CM | POA: Diagnosis not present

## 2022-03-23 DIAGNOSIS — I69222 Dysarthria following other nontraumatic intracranial hemorrhage: Secondary | ICD-10-CM | POA: Diagnosis not present

## 2022-03-23 DIAGNOSIS — E440 Moderate protein-calorie malnutrition: Secondary | ICD-10-CM | POA: Diagnosis not present

## 2022-03-23 DIAGNOSIS — G9340 Encephalopathy, unspecified: Secondary | ICD-10-CM | POA: Diagnosis not present

## 2022-03-23 DIAGNOSIS — J449 Chronic obstructive pulmonary disease, unspecified: Secondary | ICD-10-CM | POA: Diagnosis not present

## 2022-03-23 DIAGNOSIS — I69220 Aphasia following other nontraumatic intracranial hemorrhage: Secondary | ICD-10-CM | POA: Diagnosis not present

## 2022-03-23 DIAGNOSIS — E1140 Type 2 diabetes mellitus with diabetic neuropathy, unspecified: Secondary | ICD-10-CM | POA: Diagnosis not present

## 2022-03-23 DIAGNOSIS — I110 Hypertensive heart disease with heart failure: Secondary | ICD-10-CM | POA: Diagnosis not present

## 2022-03-23 DIAGNOSIS — I5032 Chronic diastolic (congestive) heart failure: Secondary | ICD-10-CM | POA: Diagnosis not present

## 2022-03-23 DIAGNOSIS — I6921 Attention and concentration deficit following other nontraumatic intracranial hemorrhage: Secondary | ICD-10-CM | POA: Diagnosis not present

## 2022-03-24 DIAGNOSIS — R4182 Altered mental status, unspecified: Secondary | ICD-10-CM | POA: Diagnosis not present

## 2022-03-24 DIAGNOSIS — R9082 White matter disease, unspecified: Secondary | ICD-10-CM | POA: Diagnosis not present

## 2022-03-24 DIAGNOSIS — I639 Cerebral infarction, unspecified: Secondary | ICD-10-CM | POA: Diagnosis not present

## 2022-03-27 DIAGNOSIS — I5032 Chronic diastolic (congestive) heart failure: Secondary | ICD-10-CM | POA: Diagnosis not present

## 2022-03-27 DIAGNOSIS — E440 Moderate protein-calorie malnutrition: Secondary | ICD-10-CM | POA: Diagnosis not present

## 2022-03-27 DIAGNOSIS — I10 Essential (primary) hypertension: Secondary | ICD-10-CM | POA: Diagnosis not present

## 2022-03-27 DIAGNOSIS — I110 Hypertensive heart disease with heart failure: Secondary | ICD-10-CM | POA: Diagnosis not present

## 2022-03-27 DIAGNOSIS — G9340 Encephalopathy, unspecified: Secondary | ICD-10-CM | POA: Diagnosis not present

## 2022-03-27 DIAGNOSIS — E1140 Type 2 diabetes mellitus with diabetic neuropathy, unspecified: Secondary | ICD-10-CM | POA: Diagnosis not present

## 2022-03-27 DIAGNOSIS — I2510 Atherosclerotic heart disease of native coronary artery without angina pectoris: Secondary | ICD-10-CM | POA: Diagnosis not present

## 2022-03-27 DIAGNOSIS — I69222 Dysarthria following other nontraumatic intracranial hemorrhage: Secondary | ICD-10-CM | POA: Diagnosis not present

## 2022-03-27 DIAGNOSIS — J449 Chronic obstructive pulmonary disease, unspecified: Secondary | ICD-10-CM | POA: Diagnosis not present

## 2022-03-27 DIAGNOSIS — I6921 Attention and concentration deficit following other nontraumatic intracranial hemorrhage: Secondary | ICD-10-CM | POA: Diagnosis not present

## 2022-03-27 DIAGNOSIS — I69220 Aphasia following other nontraumatic intracranial hemorrhage: Secondary | ICD-10-CM | POA: Diagnosis not present

## 2022-03-27 DIAGNOSIS — I480 Paroxysmal atrial fibrillation: Secondary | ICD-10-CM | POA: Diagnosis not present

## 2022-03-28 DIAGNOSIS — E1140 Type 2 diabetes mellitus with diabetic neuropathy, unspecified: Secondary | ICD-10-CM | POA: Diagnosis not present

## 2022-03-29 ENCOUNTER — Encounter (HOSPITAL_COMMUNITY): Payer: Self-pay | Admitting: *Deleted

## 2022-03-29 DIAGNOSIS — N390 Urinary tract infection, site not specified: Secondary | ICD-10-CM | POA: Diagnosis not present

## 2022-03-29 DIAGNOSIS — I10 Essential (primary) hypertension: Secondary | ICD-10-CM | POA: Diagnosis not present

## 2022-03-30 DIAGNOSIS — E440 Moderate protein-calorie malnutrition: Secondary | ICD-10-CM | POA: Diagnosis not present

## 2022-03-30 DIAGNOSIS — I110 Hypertensive heart disease with heart failure: Secondary | ICD-10-CM | POA: Diagnosis not present

## 2022-03-30 DIAGNOSIS — I5032 Chronic diastolic (congestive) heart failure: Secondary | ICD-10-CM | POA: Diagnosis not present

## 2022-03-30 DIAGNOSIS — I69222 Dysarthria following other nontraumatic intracranial hemorrhage: Secondary | ICD-10-CM | POA: Diagnosis not present

## 2022-03-30 DIAGNOSIS — G9340 Encephalopathy, unspecified: Secondary | ICD-10-CM | POA: Diagnosis not present

## 2022-03-30 DIAGNOSIS — E1140 Type 2 diabetes mellitus with diabetic neuropathy, unspecified: Secondary | ICD-10-CM | POA: Diagnosis not present

## 2022-03-30 DIAGNOSIS — I69220 Aphasia following other nontraumatic intracranial hemorrhage: Secondary | ICD-10-CM | POA: Diagnosis not present

## 2022-03-30 DIAGNOSIS — I6921 Attention and concentration deficit following other nontraumatic intracranial hemorrhage: Secondary | ICD-10-CM | POA: Diagnosis not present

## 2022-03-30 DIAGNOSIS — J449 Chronic obstructive pulmonary disease, unspecified: Secondary | ICD-10-CM | POA: Diagnosis not present

## 2022-04-02 DIAGNOSIS — I480 Paroxysmal atrial fibrillation: Secondary | ICD-10-CM | POA: Diagnosis not present

## 2022-04-03 DIAGNOSIS — I110 Hypertensive heart disease with heart failure: Secondary | ICD-10-CM | POA: Diagnosis not present

## 2022-04-03 DIAGNOSIS — J449 Chronic obstructive pulmonary disease, unspecified: Secondary | ICD-10-CM | POA: Diagnosis not present

## 2022-04-03 DIAGNOSIS — E1140 Type 2 diabetes mellitus with diabetic neuropathy, unspecified: Secondary | ICD-10-CM | POA: Diagnosis not present

## 2022-04-03 DIAGNOSIS — I69220 Aphasia following other nontraumatic intracranial hemorrhage: Secondary | ICD-10-CM | POA: Diagnosis not present

## 2022-04-03 DIAGNOSIS — I69222 Dysarthria following other nontraumatic intracranial hemorrhage: Secondary | ICD-10-CM | POA: Diagnosis not present

## 2022-04-03 DIAGNOSIS — I6921 Attention and concentration deficit following other nontraumatic intracranial hemorrhage: Secondary | ICD-10-CM | POA: Diagnosis not present

## 2022-04-03 DIAGNOSIS — G9340 Encephalopathy, unspecified: Secondary | ICD-10-CM | POA: Diagnosis not present

## 2022-04-03 DIAGNOSIS — E440 Moderate protein-calorie malnutrition: Secondary | ICD-10-CM | POA: Diagnosis not present

## 2022-04-03 DIAGNOSIS — I5032 Chronic diastolic (congestive) heart failure: Secondary | ICD-10-CM | POA: Diagnosis not present

## 2022-04-04 DIAGNOSIS — M25522 Pain in left elbow: Secondary | ICD-10-CM | POA: Diagnosis not present

## 2022-04-04 DIAGNOSIS — R4182 Altered mental status, unspecified: Secondary | ICD-10-CM | POA: Diagnosis not present

## 2022-04-04 DIAGNOSIS — M7989 Other specified soft tissue disorders: Secondary | ICD-10-CM | POA: Diagnosis not present

## 2022-04-04 DIAGNOSIS — E860 Dehydration: Secondary | ICD-10-CM | POA: Diagnosis not present

## 2022-04-04 DIAGNOSIS — M79642 Pain in left hand: Secondary | ICD-10-CM | POA: Diagnosis not present

## 2022-04-04 DIAGNOSIS — I10 Essential (primary) hypertension: Secondary | ICD-10-CM | POA: Diagnosis not present

## 2022-04-04 DIAGNOSIS — E1140 Type 2 diabetes mellitus with diabetic neuropathy, unspecified: Secondary | ICD-10-CM | POA: Diagnosis not present

## 2022-04-04 DIAGNOSIS — R627 Adult failure to thrive: Secondary | ICD-10-CM | POA: Diagnosis not present

## 2022-04-04 DIAGNOSIS — Z794 Long term (current) use of insulin: Secondary | ICD-10-CM | POA: Diagnosis not present

## 2022-04-05 DIAGNOSIS — I69220 Aphasia following other nontraumatic intracranial hemorrhage: Secondary | ICD-10-CM | POA: Diagnosis not present

## 2022-04-05 DIAGNOSIS — I69222 Dysarthria following other nontraumatic intracranial hemorrhage: Secondary | ICD-10-CM | POA: Diagnosis not present

## 2022-04-05 DIAGNOSIS — I5032 Chronic diastolic (congestive) heart failure: Secondary | ICD-10-CM | POA: Diagnosis not present

## 2022-04-05 DIAGNOSIS — J449 Chronic obstructive pulmonary disease, unspecified: Secondary | ICD-10-CM | POA: Diagnosis not present

## 2022-04-05 DIAGNOSIS — I110 Hypertensive heart disease with heart failure: Secondary | ICD-10-CM | POA: Diagnosis not present

## 2022-04-05 DIAGNOSIS — G9340 Encephalopathy, unspecified: Secondary | ICD-10-CM | POA: Diagnosis not present

## 2022-04-05 DIAGNOSIS — E440 Moderate protein-calorie malnutrition: Secondary | ICD-10-CM | POA: Diagnosis not present

## 2022-04-05 DIAGNOSIS — E1140 Type 2 diabetes mellitus with diabetic neuropathy, unspecified: Secondary | ICD-10-CM | POA: Diagnosis not present

## 2022-04-05 DIAGNOSIS — I6921 Attention and concentration deficit following other nontraumatic intracranial hemorrhage: Secondary | ICD-10-CM | POA: Diagnosis not present

## 2022-04-06 DIAGNOSIS — I6921 Attention and concentration deficit following other nontraumatic intracranial hemorrhage: Secondary | ICD-10-CM | POA: Diagnosis not present

## 2022-04-06 DIAGNOSIS — I69220 Aphasia following other nontraumatic intracranial hemorrhage: Secondary | ICD-10-CM | POA: Diagnosis not present

## 2022-04-06 DIAGNOSIS — I69292 Facial weakness following other nontraumatic intracranial hemorrhage: Secondary | ICD-10-CM | POA: Diagnosis not present

## 2022-04-10 DIAGNOSIS — H8101 Meniere's disease, right ear: Secondary | ICD-10-CM | POA: Diagnosis not present

## 2022-04-10 DIAGNOSIS — J449 Chronic obstructive pulmonary disease, unspecified: Secondary | ICD-10-CM | POA: Diagnosis not present

## 2022-04-10 DIAGNOSIS — I69222 Dysarthria following other nontraumatic intracranial hemorrhage: Secondary | ICD-10-CM | POA: Diagnosis not present

## 2022-04-10 DIAGNOSIS — I5032 Chronic diastolic (congestive) heart failure: Secondary | ICD-10-CM | POA: Diagnosis not present

## 2022-04-10 DIAGNOSIS — I69220 Aphasia following other nontraumatic intracranial hemorrhage: Secondary | ICD-10-CM | POA: Diagnosis not present

## 2022-04-10 DIAGNOSIS — I69292 Facial weakness following other nontraumatic intracranial hemorrhage: Secondary | ICD-10-CM | POA: Diagnosis not present

## 2022-04-10 DIAGNOSIS — I110 Hypertensive heart disease with heart failure: Secondary | ICD-10-CM | POA: Diagnosis not present

## 2022-04-10 DIAGNOSIS — I2510 Atherosclerotic heart disease of native coronary artery without angina pectoris: Secondary | ICD-10-CM | POA: Diagnosis not present

## 2022-04-10 DIAGNOSIS — E1140 Type 2 diabetes mellitus with diabetic neuropathy, unspecified: Secondary | ICD-10-CM | POA: Diagnosis not present

## 2022-04-10 DIAGNOSIS — G9340 Encephalopathy, unspecified: Secondary | ICD-10-CM | POA: Diagnosis not present

## 2022-04-10 DIAGNOSIS — I6921 Attention and concentration deficit following other nontraumatic intracranial hemorrhage: Secondary | ICD-10-CM | POA: Diagnosis not present

## 2022-04-10 DIAGNOSIS — E440 Moderate protein-calorie malnutrition: Secondary | ICD-10-CM | POA: Diagnosis not present

## 2022-04-10 DIAGNOSIS — F0781 Postconcussional syndrome: Secondary | ICD-10-CM | POA: Diagnosis not present

## 2022-04-11 DIAGNOSIS — G9340 Encephalopathy, unspecified: Secondary | ICD-10-CM | POA: Diagnosis not present

## 2022-04-11 DIAGNOSIS — I69220 Aphasia following other nontraumatic intracranial hemorrhage: Secondary | ICD-10-CM | POA: Diagnosis not present

## 2022-04-11 DIAGNOSIS — I6921 Attention and concentration deficit following other nontraumatic intracranial hemorrhage: Secondary | ICD-10-CM | POA: Diagnosis not present

## 2022-04-11 DIAGNOSIS — I110 Hypertensive heart disease with heart failure: Secondary | ICD-10-CM | POA: Diagnosis not present

## 2022-04-11 DIAGNOSIS — E1140 Type 2 diabetes mellitus with diabetic neuropathy, unspecified: Secondary | ICD-10-CM | POA: Diagnosis not present

## 2022-04-11 DIAGNOSIS — J449 Chronic obstructive pulmonary disease, unspecified: Secondary | ICD-10-CM | POA: Diagnosis not present

## 2022-04-11 DIAGNOSIS — E440 Moderate protein-calorie malnutrition: Secondary | ICD-10-CM | POA: Diagnosis not present

## 2022-04-11 DIAGNOSIS — I5032 Chronic diastolic (congestive) heart failure: Secondary | ICD-10-CM | POA: Diagnosis not present

## 2022-04-11 DIAGNOSIS — I69222 Dysarthria following other nontraumatic intracranial hemorrhage: Secondary | ICD-10-CM | POA: Diagnosis not present

## 2022-04-12 DIAGNOSIS — I69220 Aphasia following other nontraumatic intracranial hemorrhage: Secondary | ICD-10-CM | POA: Diagnosis not present

## 2022-04-12 DIAGNOSIS — M24542 Contracture, left hand: Secondary | ICD-10-CM | POA: Diagnosis not present

## 2022-04-12 DIAGNOSIS — I69222 Dysarthria following other nontraumatic intracranial hemorrhage: Secondary | ICD-10-CM | POA: Diagnosis not present

## 2022-04-12 DIAGNOSIS — M7989 Other specified soft tissue disorders: Secondary | ICD-10-CM | POA: Diagnosis not present

## 2022-04-12 DIAGNOSIS — I110 Hypertensive heart disease with heart failure: Secondary | ICD-10-CM | POA: Diagnosis not present

## 2022-04-12 DIAGNOSIS — M24532 Contracture, left wrist: Secondary | ICD-10-CM | POA: Diagnosis not present

## 2022-04-12 DIAGNOSIS — I5032 Chronic diastolic (congestive) heart failure: Secondary | ICD-10-CM | POA: Diagnosis not present

## 2022-04-12 DIAGNOSIS — G9340 Encephalopathy, unspecified: Secondary | ICD-10-CM | POA: Diagnosis not present

## 2022-04-12 DIAGNOSIS — M24522 Contracture, left elbow: Secondary | ICD-10-CM | POA: Diagnosis not present

## 2022-04-12 DIAGNOSIS — E440 Moderate protein-calorie malnutrition: Secondary | ICD-10-CM | POA: Diagnosis not present

## 2022-04-12 DIAGNOSIS — I6921 Attention and concentration deficit following other nontraumatic intracranial hemorrhage: Secondary | ICD-10-CM | POA: Diagnosis not present

## 2022-04-12 DIAGNOSIS — E1140 Type 2 diabetes mellitus with diabetic neuropathy, unspecified: Secondary | ICD-10-CM | POA: Diagnosis not present

## 2022-04-12 DIAGNOSIS — J101 Influenza due to other identified influenza virus with other respiratory manifestations: Secondary | ICD-10-CM | POA: Diagnosis not present

## 2022-04-12 DIAGNOSIS — J449 Chronic obstructive pulmonary disease, unspecified: Secondary | ICD-10-CM | POA: Diagnosis not present

## 2022-04-18 DIAGNOSIS — G9340 Encephalopathy, unspecified: Secondary | ICD-10-CM | POA: Diagnosis not present

## 2022-04-18 DIAGNOSIS — E440 Moderate protein-calorie malnutrition: Secondary | ICD-10-CM | POA: Diagnosis not present

## 2022-04-18 DIAGNOSIS — I69222 Dysarthria following other nontraumatic intracranial hemorrhage: Secondary | ICD-10-CM | POA: Diagnosis not present

## 2022-04-18 DIAGNOSIS — I110 Hypertensive heart disease with heart failure: Secondary | ICD-10-CM | POA: Diagnosis not present

## 2022-04-18 DIAGNOSIS — I5032 Chronic diastolic (congestive) heart failure: Secondary | ICD-10-CM | POA: Diagnosis not present

## 2022-04-18 DIAGNOSIS — I69220 Aphasia following other nontraumatic intracranial hemorrhage: Secondary | ICD-10-CM | POA: Diagnosis not present

## 2022-04-18 DIAGNOSIS — J449 Chronic obstructive pulmonary disease, unspecified: Secondary | ICD-10-CM | POA: Diagnosis not present

## 2022-04-18 DIAGNOSIS — E1140 Type 2 diabetes mellitus with diabetic neuropathy, unspecified: Secondary | ICD-10-CM | POA: Diagnosis not present

## 2022-04-18 DIAGNOSIS — I6921 Attention and concentration deficit following other nontraumatic intracranial hemorrhage: Secondary | ICD-10-CM | POA: Diagnosis not present

## 2022-04-21 DIAGNOSIS — I69222 Dysarthria following other nontraumatic intracranial hemorrhage: Secondary | ICD-10-CM | POA: Diagnosis not present

## 2022-04-21 DIAGNOSIS — E1140 Type 2 diabetes mellitus with diabetic neuropathy, unspecified: Secondary | ICD-10-CM | POA: Diagnosis not present

## 2022-04-21 DIAGNOSIS — G9340 Encephalopathy, unspecified: Secondary | ICD-10-CM | POA: Diagnosis not present

## 2022-04-21 DIAGNOSIS — J449 Chronic obstructive pulmonary disease, unspecified: Secondary | ICD-10-CM | POA: Diagnosis not present

## 2022-04-21 DIAGNOSIS — I69220 Aphasia following other nontraumatic intracranial hemorrhage: Secondary | ICD-10-CM | POA: Diagnosis not present

## 2022-04-21 DIAGNOSIS — I6921 Attention and concentration deficit following other nontraumatic intracranial hemorrhage: Secondary | ICD-10-CM | POA: Diagnosis not present

## 2022-04-21 DIAGNOSIS — I5032 Chronic diastolic (congestive) heart failure: Secondary | ICD-10-CM | POA: Diagnosis not present

## 2022-04-21 DIAGNOSIS — E440 Moderate protein-calorie malnutrition: Secondary | ICD-10-CM | POA: Diagnosis not present

## 2022-04-21 DIAGNOSIS — I110 Hypertensive heart disease with heart failure: Secondary | ICD-10-CM | POA: Diagnosis not present

## 2022-04-23 DIAGNOSIS — Z113 Encounter for screening for infections with a predominantly sexual mode of transmission: Secondary | ICD-10-CM | POA: Diagnosis not present

## 2022-04-23 DIAGNOSIS — L0291 Cutaneous abscess, unspecified: Secondary | ICD-10-CM | POA: Diagnosis not present

## 2022-04-23 DIAGNOSIS — Z1159 Encounter for screening for other viral diseases: Secondary | ICD-10-CM | POA: Diagnosis not present

## 2022-04-23 DIAGNOSIS — M2550 Pain in unspecified joint: Secondary | ICD-10-CM | POA: Diagnosis not present

## 2022-04-23 DIAGNOSIS — Z87891 Personal history of nicotine dependence: Secondary | ICD-10-CM | POA: Diagnosis not present

## 2022-04-23 DIAGNOSIS — R0902 Hypoxemia: Secondary | ICD-10-CM | POA: Diagnosis not present

## 2022-04-23 DIAGNOSIS — I10 Essential (primary) hypertension: Secondary | ICD-10-CM | POA: Diagnosis not present

## 2022-04-23 DIAGNOSIS — N898 Other specified noninflammatory disorders of vagina: Secondary | ICD-10-CM | POA: Diagnosis not present

## 2022-04-24 DIAGNOSIS — Z8673 Personal history of transient ischemic attack (TIA), and cerebral infarction without residual deficits: Secondary | ICD-10-CM | POA: Diagnosis not present

## 2022-04-24 DIAGNOSIS — I10 Essential (primary) hypertension: Secondary | ICD-10-CM | POA: Diagnosis not present

## 2022-04-24 DIAGNOSIS — F0781 Postconcussional syndrome: Secondary | ICD-10-CM | POA: Diagnosis not present

## 2022-04-24 DIAGNOSIS — R4182 Altered mental status, unspecified: Secondary | ICD-10-CM | POA: Diagnosis not present

## 2022-04-24 DIAGNOSIS — R296 Repeated falls: Secondary | ICD-10-CM | POA: Diagnosis not present

## 2022-04-25 DIAGNOSIS — G9340 Encephalopathy, unspecified: Secondary | ICD-10-CM | POA: Diagnosis not present

## 2022-04-25 DIAGNOSIS — J449 Chronic obstructive pulmonary disease, unspecified: Secondary | ICD-10-CM | POA: Diagnosis not present

## 2022-04-25 DIAGNOSIS — E1140 Type 2 diabetes mellitus with diabetic neuropathy, unspecified: Secondary | ICD-10-CM | POA: Diagnosis not present

## 2022-04-25 DIAGNOSIS — I69222 Dysarthria following other nontraumatic intracranial hemorrhage: Secondary | ICD-10-CM | POA: Diagnosis not present

## 2022-04-25 DIAGNOSIS — I110 Hypertensive heart disease with heart failure: Secondary | ICD-10-CM | POA: Diagnosis not present

## 2022-04-25 DIAGNOSIS — I69220 Aphasia following other nontraumatic intracranial hemorrhage: Secondary | ICD-10-CM | POA: Diagnosis not present

## 2022-04-25 DIAGNOSIS — I5032 Chronic diastolic (congestive) heart failure: Secondary | ICD-10-CM | POA: Diagnosis not present

## 2022-04-25 DIAGNOSIS — I6921 Attention and concentration deficit following other nontraumatic intracranial hemorrhage: Secondary | ICD-10-CM | POA: Diagnosis not present

## 2022-04-25 DIAGNOSIS — E440 Moderate protein-calorie malnutrition: Secondary | ICD-10-CM | POA: Diagnosis not present

## 2022-04-26 DIAGNOSIS — M24542 Contracture, left hand: Secondary | ICD-10-CM | POA: Diagnosis not present

## 2022-04-26 DIAGNOSIS — Z1330 Encounter for screening examination for mental health and behavioral disorders, unspecified: Secondary | ICD-10-CM | POA: Diagnosis not present

## 2022-04-26 DIAGNOSIS — M24522 Contracture, left elbow: Secondary | ICD-10-CM | POA: Diagnosis not present

## 2022-04-26 DIAGNOSIS — I10 Essential (primary) hypertension: Secondary | ICD-10-CM | POA: Diagnosis not present

## 2022-05-01 DIAGNOSIS — I69220 Aphasia following other nontraumatic intracranial hemorrhage: Secondary | ICD-10-CM | POA: Diagnosis not present

## 2022-05-01 DIAGNOSIS — I5032 Chronic diastolic (congestive) heart failure: Secondary | ICD-10-CM | POA: Diagnosis not present

## 2022-05-01 DIAGNOSIS — G9340 Encephalopathy, unspecified: Secondary | ICD-10-CM | POA: Diagnosis not present

## 2022-05-01 DIAGNOSIS — J449 Chronic obstructive pulmonary disease, unspecified: Secondary | ICD-10-CM | POA: Diagnosis not present

## 2022-05-01 DIAGNOSIS — E1140 Type 2 diabetes mellitus with diabetic neuropathy, unspecified: Secondary | ICD-10-CM | POA: Diagnosis not present

## 2022-05-01 DIAGNOSIS — I69222 Dysarthria following other nontraumatic intracranial hemorrhage: Secondary | ICD-10-CM | POA: Diagnosis not present

## 2022-05-01 DIAGNOSIS — E440 Moderate protein-calorie malnutrition: Secondary | ICD-10-CM | POA: Diagnosis not present

## 2022-05-01 DIAGNOSIS — I110 Hypertensive heart disease with heart failure: Secondary | ICD-10-CM | POA: Diagnosis not present

## 2022-05-01 DIAGNOSIS — I6921 Attention and concentration deficit following other nontraumatic intracranial hemorrhage: Secondary | ICD-10-CM | POA: Diagnosis not present

## 2022-05-02 DIAGNOSIS — E440 Moderate protein-calorie malnutrition: Secondary | ICD-10-CM | POA: Diagnosis not present

## 2022-05-02 DIAGNOSIS — I5032 Chronic diastolic (congestive) heart failure: Secondary | ICD-10-CM | POA: Diagnosis not present

## 2022-05-02 DIAGNOSIS — G9340 Encephalopathy, unspecified: Secondary | ICD-10-CM | POA: Diagnosis not present

## 2022-05-02 DIAGNOSIS — I69222 Dysarthria following other nontraumatic intracranial hemorrhage: Secondary | ICD-10-CM | POA: Diagnosis not present

## 2022-05-02 DIAGNOSIS — J449 Chronic obstructive pulmonary disease, unspecified: Secondary | ICD-10-CM | POA: Diagnosis not present

## 2022-05-02 DIAGNOSIS — I6921 Attention and concentration deficit following other nontraumatic intracranial hemorrhage: Secondary | ICD-10-CM | POA: Diagnosis not present

## 2022-05-02 DIAGNOSIS — E1140 Type 2 diabetes mellitus with diabetic neuropathy, unspecified: Secondary | ICD-10-CM | POA: Diagnosis not present

## 2022-05-02 DIAGNOSIS — I69220 Aphasia following other nontraumatic intracranial hemorrhage: Secondary | ICD-10-CM | POA: Diagnosis not present

## 2022-05-02 DIAGNOSIS — I110 Hypertensive heart disease with heart failure: Secondary | ICD-10-CM | POA: Diagnosis not present

## 2022-05-05 DIAGNOSIS — I6921 Attention and concentration deficit following other nontraumatic intracranial hemorrhage: Secondary | ICD-10-CM | POA: Diagnosis not present

## 2022-05-05 DIAGNOSIS — I69292 Facial weakness following other nontraumatic intracranial hemorrhage: Secondary | ICD-10-CM | POA: Diagnosis not present

## 2022-05-05 DIAGNOSIS — I69220 Aphasia following other nontraumatic intracranial hemorrhage: Secondary | ICD-10-CM | POA: Diagnosis not present

## 2022-05-09 DIAGNOSIS — B009 Herpesviral infection, unspecified: Secondary | ICD-10-CM | POA: Diagnosis not present

## 2022-05-09 DIAGNOSIS — J449 Chronic obstructive pulmonary disease, unspecified: Secondary | ICD-10-CM | POA: Diagnosis not present

## 2022-05-09 DIAGNOSIS — N907 Vulvar cyst: Secondary | ICD-10-CM | POA: Diagnosis not present

## 2022-05-09 DIAGNOSIS — H8101 Meniere's disease, right ear: Secondary | ICD-10-CM | POA: Diagnosis not present

## 2022-05-09 DIAGNOSIS — F0781 Postconcussional syndrome: Secondary | ICD-10-CM | POA: Diagnosis not present

## 2022-05-09 DIAGNOSIS — I5032 Chronic diastolic (congestive) heart failure: Secondary | ICD-10-CM | POA: Diagnosis not present

## 2022-05-09 DIAGNOSIS — I69292 Facial weakness following other nontraumatic intracranial hemorrhage: Secondary | ICD-10-CM | POA: Diagnosis not present

## 2022-05-09 DIAGNOSIS — R319 Hematuria, unspecified: Secondary | ICD-10-CM | POA: Diagnosis not present

## 2022-05-09 DIAGNOSIS — R32 Unspecified urinary incontinence: Secondary | ICD-10-CM | POA: Diagnosis not present

## 2022-05-09 DIAGNOSIS — I2510 Atherosclerotic heart disease of native coronary artery without angina pectoris: Secondary | ICD-10-CM | POA: Diagnosis not present

## 2022-05-09 DIAGNOSIS — B3731 Acute candidiasis of vulva and vagina: Secondary | ICD-10-CM | POA: Diagnosis not present

## 2022-05-09 DIAGNOSIS — I69220 Aphasia following other nontraumatic intracranial hemorrhage: Secondary | ICD-10-CM | POA: Diagnosis not present

## 2022-05-09 DIAGNOSIS — Z9189 Other specified personal risk factors, not elsewhere classified: Secondary | ICD-10-CM | POA: Diagnosis not present

## 2022-05-09 DIAGNOSIS — I6921 Attention and concentration deficit following other nontraumatic intracranial hemorrhage: Secondary | ICD-10-CM | POA: Diagnosis not present

## 2022-05-10 DIAGNOSIS — I5032 Chronic diastolic (congestive) heart failure: Secondary | ICD-10-CM | POA: Diagnosis not present

## 2022-05-10 DIAGNOSIS — E1140 Type 2 diabetes mellitus with diabetic neuropathy, unspecified: Secondary | ICD-10-CM | POA: Diagnosis not present

## 2022-05-10 DIAGNOSIS — I69220 Aphasia following other nontraumatic intracranial hemorrhage: Secondary | ICD-10-CM | POA: Diagnosis not present

## 2022-05-10 DIAGNOSIS — G9340 Encephalopathy, unspecified: Secondary | ICD-10-CM | POA: Diagnosis not present

## 2022-05-10 DIAGNOSIS — I6921 Attention and concentration deficit following other nontraumatic intracranial hemorrhage: Secondary | ICD-10-CM | POA: Diagnosis not present

## 2022-05-10 DIAGNOSIS — E440 Moderate protein-calorie malnutrition: Secondary | ICD-10-CM | POA: Diagnosis not present

## 2022-05-10 DIAGNOSIS — I69222 Dysarthria following other nontraumatic intracranial hemorrhage: Secondary | ICD-10-CM | POA: Diagnosis not present

## 2022-05-10 DIAGNOSIS — I110 Hypertensive heart disease with heart failure: Secondary | ICD-10-CM | POA: Diagnosis not present

## 2022-05-10 DIAGNOSIS — J449 Chronic obstructive pulmonary disease, unspecified: Secondary | ICD-10-CM | POA: Diagnosis not present

## 2022-05-15 DIAGNOSIS — R001 Bradycardia, unspecified: Secondary | ICD-10-CM | POA: Diagnosis not present

## 2022-05-15 DIAGNOSIS — R4182 Altered mental status, unspecified: Secondary | ICD-10-CM | POA: Diagnosis not present

## 2022-05-15 DIAGNOSIS — I480 Paroxysmal atrial fibrillation: Secondary | ICD-10-CM | POA: Diagnosis not present

## 2022-05-15 DIAGNOSIS — R0902 Hypoxemia: Secondary | ICD-10-CM | POA: Diagnosis not present

## 2022-05-15 DIAGNOSIS — E440 Moderate protein-calorie malnutrition: Secondary | ICD-10-CM | POA: Diagnosis not present

## 2022-05-15 DIAGNOSIS — I6930 Unspecified sequelae of cerebral infarction: Secondary | ICD-10-CM | POA: Diagnosis not present

## 2022-05-15 DIAGNOSIS — I10 Essential (primary) hypertension: Secondary | ICD-10-CM | POA: Diagnosis not present

## 2022-05-15 DIAGNOSIS — F0390 Unspecified dementia without behavioral disturbance: Secondary | ICD-10-CM | POA: Diagnosis not present

## 2022-05-15 DIAGNOSIS — R627 Adult failure to thrive: Secondary | ICD-10-CM | POA: Diagnosis not present

## 2022-05-17 DIAGNOSIS — I6921 Attention and concentration deficit following other nontraumatic intracranial hemorrhage: Secondary | ICD-10-CM | POA: Diagnosis not present

## 2022-05-17 DIAGNOSIS — I5032 Chronic diastolic (congestive) heart failure: Secondary | ICD-10-CM | POA: Diagnosis not present

## 2022-05-17 DIAGNOSIS — I69220 Aphasia following other nontraumatic intracranial hemorrhage: Secondary | ICD-10-CM | POA: Diagnosis not present

## 2022-05-17 DIAGNOSIS — I110 Hypertensive heart disease with heart failure: Secondary | ICD-10-CM | POA: Diagnosis not present

## 2022-05-17 DIAGNOSIS — G9340 Encephalopathy, unspecified: Secondary | ICD-10-CM | POA: Diagnosis not present

## 2022-05-17 DIAGNOSIS — J449 Chronic obstructive pulmonary disease, unspecified: Secondary | ICD-10-CM | POA: Diagnosis not present

## 2022-05-17 DIAGNOSIS — I69222 Dysarthria following other nontraumatic intracranial hemorrhage: Secondary | ICD-10-CM | POA: Diagnosis not present

## 2022-05-17 DIAGNOSIS — E1140 Type 2 diabetes mellitus with diabetic neuropathy, unspecified: Secondary | ICD-10-CM | POA: Diagnosis not present

## 2022-05-17 DIAGNOSIS — E440 Moderate protein-calorie malnutrition: Secondary | ICD-10-CM | POA: Diagnosis not present

## 2022-05-22 DIAGNOSIS — I69222 Dysarthria following other nontraumatic intracranial hemorrhage: Secondary | ICD-10-CM | POA: Diagnosis not present

## 2022-05-22 DIAGNOSIS — I110 Hypertensive heart disease with heart failure: Secondary | ICD-10-CM | POA: Diagnosis not present

## 2022-05-22 DIAGNOSIS — E440 Moderate protein-calorie malnutrition: Secondary | ICD-10-CM | POA: Diagnosis not present

## 2022-05-22 DIAGNOSIS — G9340 Encephalopathy, unspecified: Secondary | ICD-10-CM | POA: Diagnosis not present

## 2022-05-22 DIAGNOSIS — J449 Chronic obstructive pulmonary disease, unspecified: Secondary | ICD-10-CM | POA: Diagnosis not present

## 2022-05-22 DIAGNOSIS — I6921 Attention and concentration deficit following other nontraumatic intracranial hemorrhage: Secondary | ICD-10-CM | POA: Diagnosis not present

## 2022-05-22 DIAGNOSIS — I5032 Chronic diastolic (congestive) heart failure: Secondary | ICD-10-CM | POA: Diagnosis not present

## 2022-05-22 DIAGNOSIS — E1140 Type 2 diabetes mellitus with diabetic neuropathy, unspecified: Secondary | ICD-10-CM | POA: Diagnosis not present

## 2022-05-22 DIAGNOSIS — I69220 Aphasia following other nontraumatic intracranial hemorrhage: Secondary | ICD-10-CM | POA: Diagnosis not present

## 2022-05-23 DIAGNOSIS — I110 Hypertensive heart disease with heart failure: Secondary | ICD-10-CM | POA: Diagnosis not present

## 2022-05-23 DIAGNOSIS — I69222 Dysarthria following other nontraumatic intracranial hemorrhage: Secondary | ICD-10-CM | POA: Diagnosis not present

## 2022-05-23 DIAGNOSIS — G9340 Encephalopathy, unspecified: Secondary | ICD-10-CM | POA: Diagnosis not present

## 2022-05-23 DIAGNOSIS — J449 Chronic obstructive pulmonary disease, unspecified: Secondary | ICD-10-CM | POA: Diagnosis not present

## 2022-05-23 DIAGNOSIS — I69220 Aphasia following other nontraumatic intracranial hemorrhage: Secondary | ICD-10-CM | POA: Diagnosis not present

## 2022-05-23 DIAGNOSIS — I5032 Chronic diastolic (congestive) heart failure: Secondary | ICD-10-CM | POA: Diagnosis not present

## 2022-05-23 DIAGNOSIS — E440 Moderate protein-calorie malnutrition: Secondary | ICD-10-CM | POA: Diagnosis not present

## 2022-05-23 DIAGNOSIS — I6921 Attention and concentration deficit following other nontraumatic intracranial hemorrhage: Secondary | ICD-10-CM | POA: Diagnosis not present

## 2022-05-23 DIAGNOSIS — E1140 Type 2 diabetes mellitus with diabetic neuropathy, unspecified: Secondary | ICD-10-CM | POA: Diagnosis not present

## 2022-05-31 DIAGNOSIS — I6921 Attention and concentration deficit following other nontraumatic intracranial hemorrhage: Secondary | ICD-10-CM | POA: Diagnosis not present

## 2022-05-31 DIAGNOSIS — I69222 Dysarthria following other nontraumatic intracranial hemorrhage: Secondary | ICD-10-CM | POA: Diagnosis not present

## 2022-05-31 DIAGNOSIS — I5032 Chronic diastolic (congestive) heart failure: Secondary | ICD-10-CM | POA: Diagnosis not present

## 2022-05-31 DIAGNOSIS — J449 Chronic obstructive pulmonary disease, unspecified: Secondary | ICD-10-CM | POA: Diagnosis not present

## 2022-05-31 DIAGNOSIS — G9340 Encephalopathy, unspecified: Secondary | ICD-10-CM | POA: Diagnosis not present

## 2022-05-31 DIAGNOSIS — I110 Hypertensive heart disease with heart failure: Secondary | ICD-10-CM | POA: Diagnosis not present

## 2022-05-31 DIAGNOSIS — I69220 Aphasia following other nontraumatic intracranial hemorrhage: Secondary | ICD-10-CM | POA: Diagnosis not present

## 2022-05-31 DIAGNOSIS — E440 Moderate protein-calorie malnutrition: Secondary | ICD-10-CM | POA: Diagnosis not present

## 2022-05-31 DIAGNOSIS — E1140 Type 2 diabetes mellitus with diabetic neuropathy, unspecified: Secondary | ICD-10-CM | POA: Diagnosis not present

## 2022-06-04 ENCOUNTER — Encounter: Payer: Self-pay | Admitting: Pulmonary Disease

## 2022-06-04 ENCOUNTER — Ambulatory Visit: Payer: Medicare PPO | Admitting: Pulmonary Disease

## 2022-06-04 VITALS — BP 126/70 | HR 65 | Ht 67.0 in

## 2022-06-04 DIAGNOSIS — Z87891 Personal history of nicotine dependence: Secondary | ICD-10-CM

## 2022-06-04 DIAGNOSIS — R053 Chronic cough: Secondary | ICD-10-CM

## 2022-06-04 DIAGNOSIS — J449 Chronic obstructive pulmonary disease, unspecified: Secondary | ICD-10-CM

## 2022-06-04 NOTE — Progress Notes (Signed)
Synopsis: Referred in April 2024 for cough by Romie Jumper, PA  Subjective:   PATIENT ID: Melanie Lucas GENDER: female DOB: 1950/12/19, MRN: [id number]  HPI  Chief Complaint  Patient presents with   Consult    Referred by PCP for chronic cough for the past 6 months.    Melanie Lucas is a 72 year old woman, former smoker with history of CAD, DMII, hypertension, obesity, paroxysmal atrial fibrillation, CVA and vascular dementia who is referred to pulmonary clinic for cough.   She is accompanied by her daughter who provides most of the history. Patient is in wheel chair. She reports cough over the past 6 months that is worse after eating and drinking with upper airway 'rattle'. She is able to cough and clear these secretions. She was evaluated by speech therapy via [facility] 12/2021 with recommendation of nectar thick liquids. CT Chest 12/2021 shows patulous proximal esophagus.   She has history of COPD and not currently on nebulizer or inhaler therapy.   Patient quit smoking 6 months ago. She has 55 pack year smoking history. She lives with her daughter and grandson.   Past Medical History:  Diagnosis Date   Anxiety    Cancer    Skin- leg    Chronic acquired lymphedema    a. R>L;  b. 03/2012 Neg LE U/S for DVT. Legs since age 1   Complication of anesthesia    woke up during colonoscopy, Lithrostrixpy, Biospy   COPD (chronic obstructive pulmonary disease)    Coronary artery disease    Deaf, right    Diabetes mellitus    Type II   Diabetic neuropathy    Elevated TSH    a. 10/2012 - inst to f/u PCP.   Full dentures    Hematuria    a. while on pradaxa,  she reports that she has seen Dr Elba Barman and had low risk cystoscopy   History of kidney stones    Hyperlipidemia    Hypertension    patient denies   Incontinence    prior to urinating   Lacunar infarction    a. 02/2009 non-acute Lacunar infarct of the right thalamus noted on MRI of brain.   Left carotid stenosis    Lymphedema of  extremity    had this problem, since she was a teenager. especially seen in R LE   Meniere disease    Myocardial infarction    Obesity    Osteoarthritis    cervical & lumbar region, knees, hands cramp also    PAF with post-termination pauses    a. on dronedarone;  b. CHA2DS2VASc = 5 (HTN, DM, h/o lacunar infarct on MRI, Female) ->refused oral anticoagulation after h/o hematuria on pradaxa;  c. 02/2012 Echo: EF 55-60%, mildly dil LA. D. Recurrent PAF 10/2012 after only taking Multaq 1x/day - spont conv to NSR, placed back on BID Multaq/eliquis;  e. 10/2014 Multaq d/c'd->tikosyn initiated.   Shortness of breath dyspnea    with exertion   Stroke    2 mini strokes   Tobacco abuse    Vision abnormalities      Family History  Problem Relation Age of Onset   Heart attack Father    Coronary artery disease Father        strong family hx   Bladder Cancer Mother        bladder   Leukemia Brother    Breast cancer Maternal Aunt    Breast cancer Paternal Aunt    Breast cancer  Maternal Aunt      Social History   Socioeconomic History   Marital status: Married    Spouse name: Ronny   Number of children: 2   Years of education: 18   Highest education level: Master's degree (e.g., MA, MS, MEng, MEd, MSW, MBA)  Occupational History   Occupation: retired    Comment: Runner, broadcasting/film/video - art  Tobacco Use   Smoking status: Former    Packs/day: 1.00    Years: 55.00    Additional pack years: 0.00    Total pack years: 55.00    Types: Cigarettes    Quit date: 11/19/2021    Years since quitting: 0.5   Smokeless tobacco: Never   Tobacco comments:    1 pack daily 08/15/2020  Vaping Use   Vaping Use: Never used  Substance and Sexual Activity   Alcohol use: Yes    Alcohol/week: 1.0 standard drink of alcohol    Types: 1 Glasses of wine per week   Drug use: No   Sexual activity: Yes  Other Topics Concern   Not on file  Social History Narrative   Lives with daughter and grandson   Right handed    Drinks 3-5 cups caffeine daily   She operates an entertainment business   Social Determinants of Corporate investment banker Strain: Not on file  Food Insecurity: No Food Insecurity (01/06/2022)   Hunger Vital Sign    Worried About Running Out of Food in the Last Year: Never true    Ran Out of Food in the Last Year: Never true  Transportation Needs: No Transportation Needs (01/06/2022)   PRAPARE - Administrator, Civil Service (Medical): No    Lack of Transportation (Non-Medical): No  Physical Activity: Not on file  Stress: Not on file  Social Connections: Not on file  Intimate Partner Violence: Not At Risk (01/06/2022)   Humiliation, Afraid, Rape, and Kick questionnaire    Fear of Current or Ex-Partner: No    Emotionally Abused: No    Physically Abused: No    Sexually Abused: No     Allergies  Allergen Reactions   Alendronate Sodium Other (See Comments)    pain all over   Avelox [Moxifloxacin] Other (See Comments)    Unknown reaction    Fosamax [Alendronate] Other (See Comments)    Pain all over   Haldol [Haloperidol] Other (See Comments)    Causes more agitation   Pradaxa [Dabigatran Etexilate Mesylate] Other (See Comments)    Extreme bleeding   Seroquel [Quetiapine]     Causes more agitation.   Statins Nausea And Vomiting and Other (See Comments)    Muscle pain, Dizziness (intolerance)   Sulfa Antibiotics Nausea And Vomiting   Sulfamethoxazole Nausea And Vomiting    Dizziness (intolerance)   Sulfonamide Derivatives Hives    Nausea vertigo   Varenicline Other (See Comments)    nightmares   Ciprofloxacin Rash    Other reaction(s): unknown   Gabapentin Rash    burning   Tape Itching and Rash    Please use "paper" tape only     Outpatient Medications Prior to Visit  Medication Sig Dispense Refill   acetaminophen (TYLENOL) 325 MG tablet Take 2 tablets (650 mg total) by mouth every 6 (six) hours as needed for mild pain (or Fever >/= 101).      apixaban (ELIQUIS) 5 MG TABS tablet Take 5 mg by mouth 2 (two) times daily.     Baclofen 5 MG  TABS Take 2.5-5 mg by mouth at bedtime as needed.     dofetilide (TIKOSYN) 250 MCG capsule Take 1 capsule (250 mcg total) by mouth 2 (two) times daily. 60 capsule 3   DULoxetine (CYMBALTA) 30 MG capsule Take 30 mg by mouth daily.     ezetimibe (ZETIA) 10 MG tablet Take 10 mg by mouth daily.     feeding supplement (ENSURE ENLIVE / ENSURE PLUS) LIQD Take 237 mLs by mouth 3 (three) times daily between meals. 237 mL 12   insulin glargine (LANTUS) 100 UNIT/ML injection Inject 0.1 mLs (10 Units total) into the skin at bedtime. 10 mL 0   losartan (COZAAR) 50 MG tablet TAKE 1 TABLET BY MOUTH EVERY DAY (Patient taking differently: Take 50 mg by mouth daily.) 90 tablet 3   Melatonin-Theanine (MELATONIN FORTE/L-THEANINE PO) Take by mouth.     mirabegron ER (MYRBETRIQ) 50 MG TB24 tablet Take 50 mg by mouth at bedtime.     Multiple Vitamin (MULTIVITAMIN WITH MINERALS) TABS tablet Take 1 tablet by mouth daily. 30 tablet 4   potassium chloride SA (KLOR-CON M) 20 MEQ tablet Take 1 tablet (20 mEq total) by mouth daily. 90 tablet 1   Probiotic Product (PROBIOTIC PO) Take by mouth.     thiamine (VITAMIN B1) 100 MG tablet Take 1 tablet (100 mg total) by mouth daily. 30 tablet 4   valACYclovir (VALTREX) 500 MG tablet Take 500 mg by mouth as needed.     vitamin B-12 (CYANOCOBALAMIN) 500 MCG tablet Take 500 mcg by mouth daily.     Vitamin D, Ergocalciferol, (DRISDOL) 1.25 MG (50000 UNIT) CAPS capsule Take 50,000 Units by mouth every 7 (seven) days.     Zoledronic Acid (RECLAST IV) Inject into the vein.     No facility-administered medications prior to visit.   Review of Systems  Constitutional:  Negative for chills, fever, malaise/fatigue and weight loss.  HENT:  Negative for congestion, sinus pain and sore throat.   Eyes: Negative.   Respiratory:  Positive for cough and shortness of breath. Negative for hemoptysis,  sputum production and wheezing.   Cardiovascular:  Positive for palpitations. Negative for chest pain, orthopnea, claudication and leg swelling.  Gastrointestinal:  Positive for heartburn. Negative for abdominal pain, nausea and vomiting.  Genitourinary: Negative.   Musculoskeletal:  Positive for joint pain. Negative for myalgias.  Skin:  Negative for rash.  Neurological:  Negative for weakness.  Endo/Heme/Allergies:  Positive for environmental allergies.  Psychiatric/Behavioral: Negative.     Objective:   Vitals:   06/04/22 1543  BP: 126/70  Pulse: 65  SpO2: 97%  Height:  (1.702 m)     Physical Exam Constitutional:      General: She is not in acute distress.    Appearance: She is ill-appearing (chronically).  HENT:     Head: Normocephalic and atraumatic.  Eyes:     General: No scleral icterus.    Conjunctiva/sclera: Conjunctivae normal.     Pupils: Pupils are equal, round, and reactive to light.     Comments: Pupils not equal, L > R, but both responsive to light  Cardiovascular:     Rate and Rhythm: Normal rate and regular rhythm.     Pulses: Normal pulses.     Heart sounds: Normal heart sounds. No murmur heard. Pulmonary:     Effort: Pulmonary effort is normal.     Breath sounds: Normal breath sounds. No wheezing, rhonchi or rales.  Abdominal:     General: Bowel  sounds are normal.     Palpations: Abdomen is soft.  Musculoskeletal:     Right lower leg: Edema present.     Left lower leg: Edema present.  Lymphadenopathy:     Cervical: No cervical adenopathy.  Skin:    General: Skin is warm and dry.  Neurological:     Mental Status: She is alert.     Comments: Awake, follows simple intermittent commands  Psychiatric:        Mood and Affect: Mood normal.        Behavior: Behavior normal.        Thought Content: Thought content normal.        Judgment: Judgment normal.    CBC    Component Value Date/Time   WBC 6.1 01/17/2022 0703   RBC 4.64 01/17/2022  0703   HGB 14.7 01/17/2022 0703   HGB 14.8 11/26/2016 1550   HCT 42.4 01/17/2022 0703   HCT 43.5 11/26/2016 1550   PLT 267 01/17/2022 0703   PLT 180 11/26/2016 1550   MCV 91.4 01/17/2022 0703   MCV 91 11/26/2016 1550   MCH 31.7 01/17/2022 0703   MCHC 34.7 01/17/2022 0703   RDW 12.8 01/17/2022 0703   RDW 14.0 11/26/2016 1550   LYMPHSABS 1.5 01/06/2022 0550   LYMPHSABS 2.2 11/26/2016 1550   MONOABS 0.5 01/06/2022 0550   EOSABS 0.1 01/06/2022 0550   EOSABS 0.2 11/26/2016 1550   BASOSABS 0.1 01/06/2022 0550   BASOSABS 0.0 11/26/2016 1550      Latest Ref Rng & Units 01/17/2022    7:03 AM 01/16/2022    4:52 AM 01/15/2022    3:50 AM  BMP  Glucose 70 - 99 mg/dL 643  329  518   BUN 8 - 23 mg/dL 20  21  30    Creatinine 0.44 - 1.00 mg/dL 8.41  6.60  6.30   Sodium 135 - 145 mmol/L 133  136  135   Potassium 3.5 - 5.1 mmol/L 4.1  3.8  4.4   Chloride 98 - 111 mmol/L 102  103  101   CO2 22 - 32 mmol/L 24  24  24    Calcium 8.9 - 10.3 mg/dL 8.4  8.1  8.9    Chest imaging: CT Chest 12/02/21 Cardiovascular: Satisfactory opacification the bilateral pulmonary arteries to the segmental level. No evidence of pulmonary embolism.   Although not tailored for evaluation of the thoracic aorta, there is no evidence thoracic aortic aneurysm or dissection.   The heart is normal in size.  No pericardial effusion.   Three vessel coronary atherosclerosis.   Mediastinum/Nodes: No suspicious mediastinal lymphadenopathy.   Visualized thyroid is unremarkable.   Lungs/Pleura: Evaluation of the lung parenchyma is constrained by respiratory motion. Within that constraint, there are no suspicious pulmonary nodules.   Very mild atelectasis in the lingula and bilateral lower lobes.   No focal consolidation.   No pleural effusion or pneumothorax.  PFT:     No data to display         Labs:  Path:  Echo 03/07/21: LV EF 55-60%. Mild concentric left ventricular hypertrophy. Grade I diastolic  dysfunction. RV size and systolic function are normal.   Heart Catheterization:  Swallow Evaluation 01/16/22 Patient is exhbiting a moderate-severe oral dysphagia and a moderate pharyngeal dyshpagia as per this MBS. Cognitive component highly likely and appear to be significantly impacting both oral and pharyngeal phases of swallow. During oral phase, it did appear that she did not like the taste  of the thin liquid barium as she held it in her mouth for a prolonged period of time but with nectar thick liquids and puree solids barium, oral transit was more timely. She exhibited piecemeal swallowing with puree solids, nectar thick and thin liquids. Swallow intiation was delayed at level of vallecular sinus with puree solids and nectar thick liquids. Trace aspiration occured during the swallow with thin liquids, which was not sensed by patient. Flash penetration occured with nectar thick liquids (PAS 2) but no aspiration. Post initial swallows, patient with trace to mild vallecular residuals with puree solids and nectar thick liquids and trace residuals in pyriform sinus with puree and nectar thick. SLP recommending to continue with full liquids, nectar thick and will follow for ability to advance. Patient is at high risk of malnutrition and dehydration   Assessment & Plan:   Chronic obstructive pulmonary disease, unspecified COPD type  Former smoker  Chronic cough  Discussion: Melanie Lucas is a 72 year old woman, former smoker with history of CAD, DMII, hypertension, obesity, paroxysmal atrial fibrillation, CVA and vascular dementia who is referred to pulmonary clinic for cough.   Her cough is in setting of dysphagia with significant aspiration risk and history of COPD with recent smoking cessation.  I have recommended consideration of PEG tube placement to reduce her risk of aspiration while being able to provide adequate nutrition.   She is to try budesonide and brovana nebulizer treatments twice  daily and monitor for any changes in her cough.   She may benefit from further GI evaluation or speech therapy once second opinion is obtained from [facility].   Follow up in 4 months.   Melody Comas, MD [facility] Office: [phone]   Current Outpatient Medications:    acetaminophen (TYLENOL) 325 MG tablet, Take 2 tablets (650 mg total) by mouth every 6 (six) hours as needed for mild pain (or Fever >/= 101)., Disp: , Rfl:    apixaban (ELIQUIS) 5 MG TABS tablet, Take 5 mg by mouth 2 (two) times daily., Disp: , Rfl:    Baclofen 5 MG TABS, Take 2.5-5 mg by mouth at bedtime as needed., Disp: , Rfl:    dofetilide (TIKOSYN) 250 MCG capsule, Take 1 capsule (250 mcg total) by mouth 2 (two) times daily., Disp: 60 capsule, Rfl: 3   DULoxetine (CYMBALTA) 30 MG capsule, Take 30 mg by mouth daily., Disp: , Rfl:    ezetimibe (ZETIA) 10 MG tablet, Take 10 mg by mouth daily., Disp: , Rfl:    feeding supplement (ENSURE ENLIVE / ENSURE PLUS) LIQD, Take 237 mLs by mouth 3 (three) times daily between meals., Disp: 237 mL, Rfl: 12   insulin glargine (LANTUS) 100 UNIT/ML injection, Inject 0.1 mLs (10 Units total) into the skin at bedtime., Disp: 10 mL, Rfl: 0   losartan (COZAAR) 50 MG tablet, TAKE 1 TABLET BY MOUTH EVERY DAY (Patient taking differently: Take 50 mg by mouth daily.), Disp: 90 tablet, Rfl: 3   Melatonin-Theanine (MELATONIN FORTE/L-THEANINE PO), Take by mouth., Disp: , Rfl:    mirabegron ER (MYRBETRIQ) 50 MG TB24 tablet, Take 50 mg by mouth at bedtime., Disp: , Rfl:    Multiple Vitamin (MULTIVITAMIN WITH MINERALS) TABS tablet, Take 1 tablet by mouth daily., Disp: 30 tablet, Rfl: 4   potassium chloride SA (KLOR-CON M) 20 MEQ tablet, Take 1 tablet (20 mEq total) by mouth daily., Disp: 90 tablet, Rfl: 1   Probiotic Product (PROBIOTIC PO), Take by mouth., Disp: ,  Rfl:    thiamine (VITAMIN B1) 100 MG tablet, Take 1 tablet (100 mg total) by mouth daily., Disp: 30 tablet,  Rfl: 4   valACYclovir (VALTREX) 500 MG tablet, Take 500 mg by mouth as needed., Disp: , Rfl:    vitamin B-12 (CYANOCOBALAMIN) 500 MCG tablet, Take 500 mcg by mouth daily., Disp: , Rfl:    Vitamin D, Ergocalciferol, (DRISDOL) 1.25 MG (50000 UNIT) CAPS capsule, Take 50,000 Units by mouth every 7 (seven) days., Disp: , Rfl:    Zoledronic Acid (RECLAST IV), Inject into the vein., Disp: , Rfl:

## 2022-06-04 NOTE — Patient Instructions (Addendum)
I believe her cough is coming from her esophagus with risk for aspiration  We will place order for nebulizer machine and supplies  Start budesonide 0.5mg  twice daily  Start brovana nebulizer treatment twice daily  Consider PEG tube placement to give nutrition with reducing her risk for aspiration.   Consider referral to GI for evaluation of the esophagus  Follow up in 4 months

## 2022-06-06 ENCOUNTER — Telehealth: Payer: Self-pay | Admitting: Pulmonary Disease

## 2022-06-06 DIAGNOSIS — E1140 Type 2 diabetes mellitus with diabetic neuropathy, unspecified: Secondary | ICD-10-CM | POA: Diagnosis not present

## 2022-06-06 DIAGNOSIS — G9340 Encephalopathy, unspecified: Secondary | ICD-10-CM | POA: Diagnosis not present

## 2022-06-06 DIAGNOSIS — I110 Hypertensive heart disease with heart failure: Secondary | ICD-10-CM | POA: Diagnosis not present

## 2022-06-06 DIAGNOSIS — I69222 Dysarthria following other nontraumatic intracranial hemorrhage: Secondary | ICD-10-CM | POA: Diagnosis not present

## 2022-06-06 DIAGNOSIS — I69220 Aphasia following other nontraumatic intracranial hemorrhage: Secondary | ICD-10-CM | POA: Diagnosis not present

## 2022-06-06 DIAGNOSIS — I5032 Chronic diastolic (congestive) heart failure: Secondary | ICD-10-CM | POA: Diagnosis not present

## 2022-06-06 DIAGNOSIS — I6921 Attention and concentration deficit following other nontraumatic intracranial hemorrhage: Secondary | ICD-10-CM | POA: Diagnosis not present

## 2022-06-06 DIAGNOSIS — E440 Moderate protein-calorie malnutrition: Secondary | ICD-10-CM | POA: Diagnosis not present

## 2022-06-06 DIAGNOSIS — J449 Chronic obstructive pulmonary disease, unspecified: Secondary | ICD-10-CM | POA: Diagnosis not present

## 2022-06-06 NOTE — Telephone Encounter (Signed)
Gertie Gowda states needs demographics and office notes for Yupelri. [place] phone number is [phone]. Fax number is [phone].

## 2022-06-06 NOTE — Telephone Encounter (Signed)
Office note printed and faxed to [facility]. Nothing further needed

## 2022-06-07 NOTE — Telephone Encounter (Signed)
Received a call from [facility] stating that they were still needing clinical information, insurance, and demographics info on pt. Stated to them that we have faxed all info to fax number [phone] and when we try to fax to the main fax number that they tell us, we get messages that the faxes are not going through. They said we can try to fax to [phone].  Fax has been sent to new fax number given. 

## 2022-06-08 DIAGNOSIS — E1140 Type 2 diabetes mellitus with diabetic neuropathy, unspecified: Secondary | ICD-10-CM | POA: Diagnosis not present

## 2022-06-08 DIAGNOSIS — E440 Moderate protein-calorie malnutrition: Secondary | ICD-10-CM | POA: Diagnosis not present

## 2022-06-08 DIAGNOSIS — I5032 Chronic diastolic (congestive) heart failure: Secondary | ICD-10-CM | POA: Diagnosis not present

## 2022-06-08 DIAGNOSIS — I6921 Attention and concentration deficit following other nontraumatic intracranial hemorrhage: Secondary | ICD-10-CM | POA: Diagnosis not present

## 2022-06-08 DIAGNOSIS — I110 Hypertensive heart disease with heart failure: Secondary | ICD-10-CM | POA: Diagnosis not present

## 2022-06-08 DIAGNOSIS — J449 Chronic obstructive pulmonary disease, unspecified: Secondary | ICD-10-CM | POA: Diagnosis not present

## 2022-06-08 DIAGNOSIS — I69222 Dysarthria following other nontraumatic intracranial hemorrhage: Secondary | ICD-10-CM | POA: Diagnosis not present

## 2022-06-08 DIAGNOSIS — I69220 Aphasia following other nontraumatic intracranial hemorrhage: Secondary | ICD-10-CM | POA: Diagnosis not present

## 2022-06-08 DIAGNOSIS — G9340 Encephalopathy, unspecified: Secondary | ICD-10-CM | POA: Diagnosis not present

## 2022-06-14 DIAGNOSIS — I5032 Chronic diastolic (congestive) heart failure: Secondary | ICD-10-CM | POA: Diagnosis not present

## 2022-06-14 DIAGNOSIS — E1140 Type 2 diabetes mellitus with diabetic neuropathy, unspecified: Secondary | ICD-10-CM | POA: Diagnosis not present

## 2022-06-14 DIAGNOSIS — I69220 Aphasia following other nontraumatic intracranial hemorrhage: Secondary | ICD-10-CM | POA: Diagnosis not present

## 2022-06-14 DIAGNOSIS — E440 Moderate protein-calorie malnutrition: Secondary | ICD-10-CM | POA: Diagnosis not present

## 2022-06-14 DIAGNOSIS — G9340 Encephalopathy, unspecified: Secondary | ICD-10-CM | POA: Diagnosis not present

## 2022-06-14 DIAGNOSIS — I110 Hypertensive heart disease with heart failure: Secondary | ICD-10-CM | POA: Diagnosis not present

## 2022-06-14 DIAGNOSIS — I69222 Dysarthria following other nontraumatic intracranial hemorrhage: Secondary | ICD-10-CM | POA: Diagnosis not present

## 2022-06-14 DIAGNOSIS — J449 Chronic obstructive pulmonary disease, unspecified: Secondary | ICD-10-CM | POA: Diagnosis not present

## 2022-06-14 DIAGNOSIS — I6921 Attention and concentration deficit following other nontraumatic intracranial hemorrhage: Secondary | ICD-10-CM | POA: Diagnosis not present

## 2022-06-21 DIAGNOSIS — I5032 Chronic diastolic (congestive) heart failure: Secondary | ICD-10-CM | POA: Diagnosis not present

## 2022-06-21 DIAGNOSIS — E1140 Type 2 diabetes mellitus with diabetic neuropathy, unspecified: Secondary | ICD-10-CM | POA: Diagnosis not present

## 2022-06-21 DIAGNOSIS — I69222 Dysarthria following other nontraumatic intracranial hemorrhage: Secondary | ICD-10-CM | POA: Diagnosis not present

## 2022-06-21 DIAGNOSIS — I69220 Aphasia following other nontraumatic intracranial hemorrhage: Secondary | ICD-10-CM | POA: Diagnosis not present

## 2022-06-21 DIAGNOSIS — G9340 Encephalopathy, unspecified: Secondary | ICD-10-CM | POA: Diagnosis not present

## 2022-06-21 DIAGNOSIS — I6921 Attention and concentration deficit following other nontraumatic intracranial hemorrhage: Secondary | ICD-10-CM | POA: Diagnosis not present

## 2022-06-21 DIAGNOSIS — J449 Chronic obstructive pulmonary disease, unspecified: Secondary | ICD-10-CM | POA: Diagnosis not present

## 2022-06-21 DIAGNOSIS — I110 Hypertensive heart disease with heart failure: Secondary | ICD-10-CM | POA: Diagnosis not present

## 2022-06-21 DIAGNOSIS — E440 Moderate protein-calorie malnutrition: Secondary | ICD-10-CM | POA: Diagnosis not present

## 2022-06-28 DIAGNOSIS — J449 Chronic obstructive pulmonary disease, unspecified: Secondary | ICD-10-CM | POA: Diagnosis not present

## 2022-06-28 DIAGNOSIS — E1140 Type 2 diabetes mellitus with diabetic neuropathy, unspecified: Secondary | ICD-10-CM | POA: Diagnosis not present

## 2022-06-28 DIAGNOSIS — I5032 Chronic diastolic (congestive) heart failure: Secondary | ICD-10-CM | POA: Diagnosis not present

## 2022-06-28 DIAGNOSIS — I69220 Aphasia following other nontraumatic intracranial hemorrhage: Secondary | ICD-10-CM | POA: Diagnosis not present

## 2022-06-28 DIAGNOSIS — I110 Hypertensive heart disease with heart failure: Secondary | ICD-10-CM | POA: Diagnosis not present

## 2022-06-28 DIAGNOSIS — G9340 Encephalopathy, unspecified: Secondary | ICD-10-CM | POA: Diagnosis not present

## 2022-06-28 DIAGNOSIS — I6921 Attention and concentration deficit following other nontraumatic intracranial hemorrhage: Secondary | ICD-10-CM | POA: Diagnosis not present

## 2022-06-28 DIAGNOSIS — I69222 Dysarthria following other nontraumatic intracranial hemorrhage: Secondary | ICD-10-CM | POA: Diagnosis not present

## 2022-06-28 DIAGNOSIS — E440 Moderate protein-calorie malnutrition: Secondary | ICD-10-CM | POA: Diagnosis not present

## 2022-07-04 DIAGNOSIS — E440 Moderate protein-calorie malnutrition: Secondary | ICD-10-CM | POA: Diagnosis not present

## 2022-07-04 DIAGNOSIS — I5032 Chronic diastolic (congestive) heart failure: Secondary | ICD-10-CM | POA: Diagnosis not present

## 2022-07-04 DIAGNOSIS — I110 Hypertensive heart disease with heart failure: Secondary | ICD-10-CM | POA: Diagnosis not present

## 2022-07-04 DIAGNOSIS — J449 Chronic obstructive pulmonary disease, unspecified: Secondary | ICD-10-CM | POA: Diagnosis not present

## 2022-07-04 DIAGNOSIS — G9340 Encephalopathy, unspecified: Secondary | ICD-10-CM | POA: Diagnosis not present

## 2022-07-04 DIAGNOSIS — I69222 Dysarthria following other nontraumatic intracranial hemorrhage: Secondary | ICD-10-CM | POA: Diagnosis not present

## 2022-07-04 DIAGNOSIS — I69220 Aphasia following other nontraumatic intracranial hemorrhage: Secondary | ICD-10-CM | POA: Diagnosis not present

## 2022-07-04 DIAGNOSIS — E1140 Type 2 diabetes mellitus with diabetic neuropathy, unspecified: Secondary | ICD-10-CM | POA: Diagnosis not present

## 2022-07-04 DIAGNOSIS — I6921 Attention and concentration deficit following other nontraumatic intracranial hemorrhage: Secondary | ICD-10-CM | POA: Diagnosis not present

## 2022-07-05 DIAGNOSIS — G9340 Encephalopathy, unspecified: Secondary | ICD-10-CM | POA: Diagnosis not present

## 2022-07-05 DIAGNOSIS — E1140 Type 2 diabetes mellitus with diabetic neuropathy, unspecified: Secondary | ICD-10-CM | POA: Diagnosis not present

## 2022-07-05 DIAGNOSIS — I6921 Attention and concentration deficit following other nontraumatic intracranial hemorrhage: Secondary | ICD-10-CM | POA: Diagnosis not present

## 2022-07-05 DIAGNOSIS — I69222 Dysarthria following other nontraumatic intracranial hemorrhage: Secondary | ICD-10-CM | POA: Diagnosis not present

## 2022-07-05 DIAGNOSIS — J449 Chronic obstructive pulmonary disease, unspecified: Secondary | ICD-10-CM | POA: Diagnosis not present

## 2022-07-05 DIAGNOSIS — E440 Moderate protein-calorie malnutrition: Secondary | ICD-10-CM | POA: Diagnosis not present

## 2022-07-05 DIAGNOSIS — I69220 Aphasia following other nontraumatic intracranial hemorrhage: Secondary | ICD-10-CM | POA: Diagnosis not present

## 2022-07-05 DIAGNOSIS — I110 Hypertensive heart disease with heart failure: Secondary | ICD-10-CM | POA: Diagnosis not present

## 2022-07-05 DIAGNOSIS — I5032 Chronic diastolic (congestive) heart failure: Secondary | ICD-10-CM | POA: Diagnosis not present

## 2023-03-25 ENCOUNTER — Encounter: Payer: Self-pay | Admitting: Acute Care

## 2023-06-05 ENCOUNTER — Encounter: Payer: Self-pay | Admitting: *Deleted

## 2023-06-20 DEATH — deceased
